# Patient Record
Sex: Female | Born: 1945 | Race: Black or African American | Hispanic: No | Smoking: Current every day smoker
Health system: Southern US, Community
[De-identification: ages and names within clinical notes are randomized; demographics above are authoritative.]

## PROBLEM LIST (undated history)

## (undated) DIAGNOSIS — I10 Essential (primary) hypertension: Secondary | ICD-10-CM

## (undated) DIAGNOSIS — G709 Myoneural disorder, unspecified: Secondary | ICD-10-CM

## (undated) DIAGNOSIS — I340 Nonrheumatic mitral (valve) insufficiency: Secondary | ICD-10-CM

## (undated) DIAGNOSIS — D689 Coagulation defect, unspecified: Secondary | ICD-10-CM

## (undated) DIAGNOSIS — N186 End stage renal disease: Secondary | ICD-10-CM

## (undated) DIAGNOSIS — I639 Cerebral infarction, unspecified: Secondary | ICD-10-CM

## (undated) DIAGNOSIS — F419 Anxiety disorder, unspecified: Secondary | ICD-10-CM

## (undated) DIAGNOSIS — M1990 Unspecified osteoarthritis, unspecified site: Secondary | ICD-10-CM

## (undated) DIAGNOSIS — Z8619 Personal history of other infectious and parasitic diseases: Secondary | ICD-10-CM

## (undated) DIAGNOSIS — K2270 Barrett's esophagus without dysplasia: Secondary | ICD-10-CM

## (undated) DIAGNOSIS — N189 Chronic kidney disease, unspecified: Secondary | ICD-10-CM

## (undated) DIAGNOSIS — L309 Dermatitis, unspecified: Secondary | ICD-10-CM

## (undated) DIAGNOSIS — I2510 Atherosclerotic heart disease of native coronary artery without angina pectoris: Secondary | ICD-10-CM

## (undated) DIAGNOSIS — Z8614 Personal history of Methicillin resistant Staphylococcus aureus infection: Secondary | ICD-10-CM

## (undated) DIAGNOSIS — F329 Major depressive disorder, single episode, unspecified: Secondary | ICD-10-CM

## (undated) DIAGNOSIS — Z86010 Personal history of colonic polyps: Secondary | ICD-10-CM

## (undated) DIAGNOSIS — E8770 Fluid overload, unspecified: Secondary | ICD-10-CM

## (undated) DIAGNOSIS — F32A Depression, unspecified: Secondary | ICD-10-CM

## (undated) DIAGNOSIS — D649 Anemia, unspecified: Secondary | ICD-10-CM

## (undated) DIAGNOSIS — E785 Hyperlipidemia, unspecified: Secondary | ICD-10-CM

## (undated) DIAGNOSIS — E440 Moderate protein-calorie malnutrition: Secondary | ICD-10-CM

## (undated) HISTORY — DX: Coagulation defect, unspecified: D68.9

## (undated) HISTORY — PX: UPPER GASTROINTESTINAL ENDOSCOPY: SHX188

## (undated) HISTORY — DX: Depression, unspecified: F32.A

## (undated) HISTORY — PX: OTHER SURGICAL HISTORY: SHX169

## (undated) HISTORY — DX: Major depressive disorder, single episode, unspecified: F32.9

## (undated) HISTORY — DX: Personal history of Methicillin resistant Staphylococcus aureus infection: Z86.14

## (undated) HISTORY — PX: CATARACT EXTRACTION: SUR2

## (undated) HISTORY — DX: Essential (primary) hypertension: I10

## (undated) HISTORY — DX: Atherosclerotic heart disease of native coronary artery without angina pectoris: I25.10

## (undated) HISTORY — PX: ABDOMINAL HYSTERECTOMY: SHX81

## (undated) HISTORY — DX: Hyperlipidemia, unspecified: E78.5

## (undated) HISTORY — DX: Personal history of colonic polyps: Z86.010

## (undated) HISTORY — DX: Unspecified osteoarthritis, unspecified site: M19.90

## (undated) HISTORY — DX: Myoneural disorder, unspecified: G70.9

## (undated) HISTORY — DX: Fluid overload, unspecified: E87.70

## (undated) HISTORY — DX: Hypercalcemia: E83.52

## (undated) HISTORY — DX: Barrett's esophagus without dysplasia: K22.70

## (undated) HISTORY — DX: Personal history of other infectious and parasitic diseases: Z86.19

## (undated) HISTORY — DX: Moderate protein-calorie malnutrition: E44.0

## (undated) HISTORY — DX: Anxiety disorder, unspecified: F41.9

## (undated) HISTORY — DX: Dermatitis, unspecified: L30.9

## (undated) HISTORY — DX: End stage renal disease: N18.6

## (undated) HISTORY — PX: COLONOSCOPY: SHX174

---

## 2000-05-24 ENCOUNTER — Emergency Department (HOSPITAL_COMMUNITY): Admission: EM | Admit: 2000-05-24 | Discharge: 2000-05-24 | Payer: Self-pay | Admitting: *Deleted

## 2006-09-28 ENCOUNTER — Emergency Department (HOSPITAL_COMMUNITY): Admission: EM | Admit: 2006-09-28 | Discharge: 2006-09-28 | Payer: Self-pay | Admitting: Emergency Medicine

## 2006-10-08 ENCOUNTER — Emergency Department (HOSPITAL_COMMUNITY): Admission: EM | Admit: 2006-10-08 | Discharge: 2006-10-08 | Payer: Self-pay | Admitting: Family Medicine

## 2007-04-25 DIAGNOSIS — Z8614 Personal history of Methicillin resistant Staphylococcus aureus infection: Secondary | ICD-10-CM

## 2007-04-25 HISTORY — DX: Personal history of Methicillin resistant Staphylococcus aureus infection: Z86.14

## 2007-08-30 ENCOUNTER — Ambulatory Visit: Payer: Self-pay | Admitting: Internal Medicine

## 2007-08-30 ENCOUNTER — Inpatient Hospital Stay (HOSPITAL_COMMUNITY): Admission: EM | Admit: 2007-08-30 | Discharge: 2007-09-06 | Payer: Self-pay | Admitting: Emergency Medicine

## 2007-09-11 ENCOUNTER — Encounter (INDEPENDENT_AMBULATORY_CARE_PROVIDER_SITE_OTHER): Payer: Self-pay | Admitting: Internal Medicine

## 2007-09-24 ENCOUNTER — Ambulatory Visit: Payer: Self-pay | Admitting: Internal Medicine

## 2007-09-24 DIAGNOSIS — E119 Type 2 diabetes mellitus without complications: Secondary | ICD-10-CM

## 2007-10-01 ENCOUNTER — Ambulatory Visit: Payer: Self-pay | Admitting: *Deleted

## 2007-10-10 ENCOUNTER — Encounter (INDEPENDENT_AMBULATORY_CARE_PROVIDER_SITE_OTHER): Payer: Self-pay | Admitting: Internal Medicine

## 2007-10-22 ENCOUNTER — Ambulatory Visit: Payer: Self-pay | Admitting: Internal Medicine

## 2007-10-22 ENCOUNTER — Encounter (INDEPENDENT_AMBULATORY_CARE_PROVIDER_SITE_OTHER): Payer: Self-pay | Admitting: Internal Medicine

## 2007-10-22 DIAGNOSIS — L0390 Cellulitis, unspecified: Secondary | ICD-10-CM

## 2007-10-22 DIAGNOSIS — L0291 Cutaneous abscess, unspecified: Secondary | ICD-10-CM | POA: Insufficient documentation

## 2007-10-22 LAB — CONVERTED CEMR LAB: Blood Glucose, AC Bkfst: 98 mg/dL

## 2007-10-23 ENCOUNTER — Encounter (INDEPENDENT_AMBULATORY_CARE_PROVIDER_SITE_OTHER): Payer: Self-pay | Admitting: Internal Medicine

## 2007-11-04 ENCOUNTER — Ambulatory Visit: Payer: Self-pay | Admitting: Internal Medicine

## 2007-11-04 DIAGNOSIS — I10 Essential (primary) hypertension: Secondary | ICD-10-CM | POA: Insufficient documentation

## 2007-11-04 LAB — CONVERTED CEMR LAB: Blood Glucose, Fingerstick: 113

## 2007-11-27 ENCOUNTER — Ambulatory Visit: Payer: Self-pay | Admitting: *Deleted

## 2007-11-27 ENCOUNTER — Encounter (INDEPENDENT_AMBULATORY_CARE_PROVIDER_SITE_OTHER): Payer: Self-pay | Admitting: Internal Medicine

## 2007-11-27 DIAGNOSIS — L738 Other specified follicular disorders: Secondary | ICD-10-CM | POA: Insufficient documentation

## 2007-11-27 LAB — CONVERTED CEMR LAB
BUN: 18 mg/dL (ref 6–23)
Basophils Relative: 0 % (ref 0–1)
Chloride: 104 meq/L (ref 96–112)
Lymphs Abs: 4.1 10*3/uL — ABNORMAL HIGH (ref 0.7–4.0)
MCHC: 32.4 g/dL (ref 30.0–36.0)
Monocytes Relative: 6 % (ref 3–12)
Neutro Abs: 3.7 10*3/uL (ref 1.7–7.7)
Neutrophils Relative %: 42 % — ABNORMAL LOW (ref 43–77)
Potassium: 4.3 meq/L (ref 3.5–5.3)
RBC: 4.18 M/uL (ref 3.87–5.11)
WBC: 8.7 10*3/uL (ref 4.0–10.5)

## 2007-12-12 ENCOUNTER — Ambulatory Visit: Payer: Self-pay | Admitting: *Deleted

## 2007-12-12 ENCOUNTER — Telehealth: Payer: Self-pay | Admitting: *Deleted

## 2007-12-13 ENCOUNTER — Encounter (INDEPENDENT_AMBULATORY_CARE_PROVIDER_SITE_OTHER): Payer: Self-pay | Admitting: Internal Medicine

## 2007-12-19 ENCOUNTER — Ambulatory Visit: Payer: Self-pay | Admitting: *Deleted

## 2007-12-19 DIAGNOSIS — G4700 Insomnia, unspecified: Secondary | ICD-10-CM

## 2007-12-19 LAB — CONVERTED CEMR LAB: Blood Glucose, Fingerstick: 106

## 2008-01-13 ENCOUNTER — Telehealth (INDEPENDENT_AMBULATORY_CARE_PROVIDER_SITE_OTHER): Payer: Self-pay | Admitting: Internal Medicine

## 2008-01-28 ENCOUNTER — Telehealth: Payer: Self-pay | Admitting: Internal Medicine

## 2008-01-28 ENCOUNTER — Encounter (INDEPENDENT_AMBULATORY_CARE_PROVIDER_SITE_OTHER): Payer: Self-pay | Admitting: Internal Medicine

## 2008-01-28 ENCOUNTER — Ambulatory Visit: Payer: Self-pay | Admitting: Internal Medicine

## 2008-01-28 LAB — CONVERTED CEMR LAB
ALT: 24 units/L (ref 0–35)
AST: 17 units/L (ref 0–37)
Alkaline Phosphatase: 73 units/L (ref 39–117)
Calcium: 9.3 mg/dL (ref 8.4–10.5)
Chloride: 108 meq/L (ref 96–112)
Creatinine, Ser: 0.81 mg/dL (ref 0.40–1.20)
Creatinine, Urine: 107.6 mg/dL
LDL Cholesterol: 79 mg/dL (ref 0–99)
Microalb Creat Ratio: 14.7 mg/g (ref 0.0–30.0)
Microalb, Ur: 1.58 mg/dL (ref 0.00–1.89)
Total Bilirubin: 0.4 mg/dL (ref 0.3–1.2)
Total CHOL/HDL Ratio: 3.5
VLDL: 49 mg/dL — ABNORMAL HIGH (ref 0–40)

## 2008-03-02 ENCOUNTER — Telehealth (INDEPENDENT_AMBULATORY_CARE_PROVIDER_SITE_OTHER): Payer: Self-pay | Admitting: Internal Medicine

## 2008-03-03 ENCOUNTER — Ambulatory Visit: Payer: Self-pay | Admitting: Internal Medicine

## 2008-03-03 ENCOUNTER — Encounter (INDEPENDENT_AMBULATORY_CARE_PROVIDER_SITE_OTHER): Payer: Self-pay | Admitting: Internal Medicine

## 2008-03-03 LAB — CONVERTED CEMR LAB: Blood Glucose, Fingerstick: 126

## 2008-03-04 ENCOUNTER — Ambulatory Visit: Payer: Self-pay | Admitting: Infectious Diseases

## 2008-03-04 LAB — CONVERTED CEMR LAB
Basophils Absolute: 0 10*3/uL (ref 0.0–0.1)
Blood Glucose, Fingerstick: 129
CO2: 26 meq/L (ref 19–32)
Glucose, Bld: 117 mg/dL — ABNORMAL HIGH (ref 70–99)
Lymphocytes Relative: 28 % (ref 12–46)
Lymphs Abs: 3.1 10*3/uL (ref 0.7–4.0)
Neutro Abs: 6.4 10*3/uL (ref 1.7–7.7)
Neutrophils Relative %: 60 % (ref 43–77)
Platelets: 280 10*3/uL (ref 150–400)
Potassium: 4.2 meq/L (ref 3.5–5.3)
RDW: 13.5 % (ref 11.5–15.5)
Sodium: 143 meq/L (ref 135–145)
WBC: 10.8 10*3/uL — ABNORMAL HIGH (ref 4.0–10.5)

## 2008-03-05 ENCOUNTER — Ambulatory Visit: Payer: Self-pay | Admitting: Infectious Diseases

## 2008-03-06 ENCOUNTER — Ambulatory Visit: Payer: Self-pay | Admitting: *Deleted

## 2008-03-09 ENCOUNTER — Ambulatory Visit: Payer: Self-pay | Admitting: Infectious Diseases

## 2008-03-11 ENCOUNTER — Ambulatory Visit: Payer: Self-pay | Admitting: Infectious Diseases

## 2008-03-16 ENCOUNTER — Encounter: Payer: Self-pay | Admitting: *Deleted

## 2008-03-16 LAB — CONVERTED CEMR LAB: Blood Glucose, Fingerstick: 111

## 2008-03-23 ENCOUNTER — Ambulatory Visit: Payer: Self-pay | Admitting: Infectious Diseases

## 2008-03-23 LAB — CONVERTED CEMR LAB: Blood Glucose, Fingerstick: 106

## 2008-06-09 ENCOUNTER — Encounter (INDEPENDENT_AMBULATORY_CARE_PROVIDER_SITE_OTHER): Payer: Self-pay | Admitting: Internal Medicine

## 2008-06-09 ENCOUNTER — Ambulatory Visit: Payer: Self-pay | Admitting: Internal Medicine

## 2008-06-09 LAB — CONVERTED CEMR LAB: Blood Glucose, AC Bkfst: 79 mg/dL

## 2008-06-25 ENCOUNTER — Encounter (INDEPENDENT_AMBULATORY_CARE_PROVIDER_SITE_OTHER): Payer: Self-pay | Admitting: *Deleted

## 2008-06-25 ENCOUNTER — Ambulatory Visit: Payer: Self-pay | Admitting: Internal Medicine

## 2008-06-25 LAB — CONVERTED CEMR LAB
BUN: 12 mg/dL (ref 6–23)
Calcium: 9.3 mg/dL (ref 8.4–10.5)
Glucose, Bld: 83 mg/dL (ref 70–99)
Potassium: 4.6 meq/L (ref 3.5–5.3)
Sodium: 143 meq/L (ref 135–145)

## 2008-07-15 ENCOUNTER — Encounter (INDEPENDENT_AMBULATORY_CARE_PROVIDER_SITE_OTHER): Payer: Self-pay | Admitting: Internal Medicine

## 2008-07-16 ENCOUNTER — Encounter (INDEPENDENT_AMBULATORY_CARE_PROVIDER_SITE_OTHER): Payer: Self-pay | Admitting: Internal Medicine

## 2008-07-16 ENCOUNTER — Encounter (INDEPENDENT_AMBULATORY_CARE_PROVIDER_SITE_OTHER): Payer: Self-pay | Admitting: *Deleted

## 2008-07-16 ENCOUNTER — Ambulatory Visit: Payer: Self-pay | Admitting: *Deleted

## 2008-07-16 ENCOUNTER — Telehealth: Payer: Self-pay | Admitting: *Deleted

## 2008-07-16 DIAGNOSIS — F32A Depression, unspecified: Secondary | ICD-10-CM | POA: Insufficient documentation

## 2008-07-16 DIAGNOSIS — R51 Headache: Secondary | ICD-10-CM

## 2008-07-16 DIAGNOSIS — R519 Headache, unspecified: Secondary | ICD-10-CM | POA: Insufficient documentation

## 2008-07-16 DIAGNOSIS — F329 Major depressive disorder, single episode, unspecified: Secondary | ICD-10-CM

## 2008-07-16 LAB — CONVERTED CEMR LAB
Blood Glucose, Fingerstick: 79
CO2: 24 meq/L (ref 19–32)
Calcium: 9.7 mg/dL (ref 8.4–10.5)
Creatinine, Ser: 0.79 mg/dL (ref 0.40–1.20)
GFR calc non Af Amer: >60 mL/min (ref >60)
Sodium: 141 meq/L (ref 135–145)

## 2008-07-22 ENCOUNTER — Ambulatory Visit: Payer: Self-pay | Admitting: Internal Medicine

## 2008-07-22 LAB — CONVERTED CEMR LAB
OCCULT 1: NEGATIVE
OCCULT 2: NEGATIVE

## 2008-07-22 LAB — HM DIABETES EYE EXAM

## 2008-08-04 ENCOUNTER — Ambulatory Visit: Payer: Self-pay | Admitting: Internal Medicine

## 2008-08-04 ENCOUNTER — Encounter (INDEPENDENT_AMBULATORY_CARE_PROVIDER_SITE_OTHER): Payer: Self-pay | Admitting: Internal Medicine

## 2008-08-04 LAB — CONVERTED CEMR LAB
BUN: 13 mg/dL (ref 6–23)
CO2: 25 meq/L (ref 19–32)
Calcium: 9.6 mg/dL (ref 8.4–10.5)
Creatinine, Ser: 0.85 mg/dL (ref 0.40–1.20)
GFR calc Af Amer: >60 mL/min (ref >60)
Glucose, Bld: 81 mg/dL (ref 70–99)
Sodium: 142 meq/L (ref 135–145)

## 2008-08-05 ENCOUNTER — Encounter (INDEPENDENT_AMBULATORY_CARE_PROVIDER_SITE_OTHER): Payer: Self-pay | Admitting: Internal Medicine

## 2008-08-13 ENCOUNTER — Encounter (INDEPENDENT_AMBULATORY_CARE_PROVIDER_SITE_OTHER): Payer: Self-pay | Admitting: Internal Medicine

## 2008-08-20 ENCOUNTER — Encounter: Payer: Self-pay | Admitting: Internal Medicine

## 2008-08-20 ENCOUNTER — Ambulatory Visit: Payer: Self-pay | Admitting: Internal Medicine

## 2008-08-20 LAB — CONVERTED CEMR LAB
BUN: 20 mg/dL (ref 6–23)
Calcium: 9.9 mg/dL (ref 8.4–10.5)
Creatinine, Ser: 1.04 mg/dL (ref 0.40–1.20)
GFR calc Af Amer: >60 mL/min (ref >60)
GFR calc non Af Amer: 54 mL/min — ABNORMAL LOW (ref >60)
Glucose, Bld: 87 mg/dL (ref 70–99)

## 2008-09-14 ENCOUNTER — Telehealth (INDEPENDENT_AMBULATORY_CARE_PROVIDER_SITE_OTHER): Payer: Self-pay | Admitting: Internal Medicine

## 2008-09-24 ENCOUNTER — Ambulatory Visit: Payer: Self-pay | Admitting: Internal Medicine

## 2008-09-24 ENCOUNTER — Encounter (INDEPENDENT_AMBULATORY_CARE_PROVIDER_SITE_OTHER): Payer: Self-pay | Admitting: Internal Medicine

## 2008-09-24 DIAGNOSIS — L259 Unspecified contact dermatitis, unspecified cause: Secondary | ICD-10-CM | POA: Insufficient documentation

## 2008-09-24 LAB — CONVERTED CEMR LAB
BUN: 14 mg/dL (ref 6–23)
CO2: 25 meq/L (ref 19–32)
Calcium: 9.8 mg/dL (ref 8.4–10.5)
Creatinine, Ser: 0.84 mg/dL (ref 0.40–1.20)
GFR calc non Af Amer: >60 mL/min (ref >60)
Glucose, Bld: 84 mg/dL (ref 70–99)
Sodium: 142 meq/L (ref 135–145)

## 2008-10-30 ENCOUNTER — Ambulatory Visit: Payer: Self-pay | Admitting: Internal Medicine

## 2008-10-30 DIAGNOSIS — E785 Hyperlipidemia, unspecified: Secondary | ICD-10-CM | POA: Insufficient documentation

## 2008-10-30 LAB — CONVERTED CEMR LAB: Blood Glucose, Fingerstick: 134

## 2008-11-23 ENCOUNTER — Ambulatory Visit: Payer: Self-pay | Admitting: Internal Medicine

## 2008-11-23 ENCOUNTER — Encounter (INDEPENDENT_AMBULATORY_CARE_PROVIDER_SITE_OTHER): Payer: Self-pay | Admitting: Internal Medicine

## 2008-11-23 DIAGNOSIS — B079 Viral wart, unspecified: Secondary | ICD-10-CM | POA: Insufficient documentation

## 2008-11-23 LAB — CONVERTED CEMR LAB
BUN: 12 mg/dL (ref 6–23)
Calcium: 9.6 mg/dL (ref 8.4–10.5)
Potassium: 4.8 meq/L (ref 3.5–5.3)
Sodium: 142 meq/L (ref 135–145)

## 2008-12-08 ENCOUNTER — Ambulatory Visit: Payer: Self-pay | Admitting: Internal Medicine

## 2008-12-22 ENCOUNTER — Ambulatory Visit: Payer: Self-pay | Admitting: Internal Medicine

## 2008-12-22 DIAGNOSIS — M25529 Pain in unspecified elbow: Secondary | ICD-10-CM

## 2009-01-19 ENCOUNTER — Ambulatory Visit: Payer: Self-pay | Admitting: Internal Medicine

## 2009-01-19 LAB — CONVERTED CEMR LAB
Blood Glucose, Fingerstick: 89
Hgb A1c MFr Bld: 6.4 %

## 2009-02-04 ENCOUNTER — Encounter (INDEPENDENT_AMBULATORY_CARE_PROVIDER_SITE_OTHER): Payer: Self-pay | Admitting: Internal Medicine

## 2009-02-04 ENCOUNTER — Ambulatory Visit: Payer: Self-pay | Admitting: Internal Medicine

## 2009-02-05 ENCOUNTER — Telehealth (INDEPENDENT_AMBULATORY_CARE_PROVIDER_SITE_OTHER): Payer: Self-pay | Admitting: Internal Medicine

## 2009-02-05 LAB — CONVERTED CEMR LAB
ALT: 16 units/L (ref 0–35)
AST: 14 units/L (ref 0–37)
Alkaline Phosphatase: 75 units/L (ref 39–117)
BUN: 11 mg/dL (ref 6–23)
Calcium: 9.5 mg/dL (ref 8.4–10.5)
Cholesterol: 169 mg/dL (ref 0–200)
Potassium: 4.9 meq/L (ref 3.5–5.3)
Sodium: 142 meq/L (ref 135–145)
Total Protein: 7.1 g/dL (ref 6.0–8.3)
Triglycerides: 161 mg/dL — ABNORMAL HIGH (ref <150)
VLDL: 32 mg/dL (ref 0–40)

## 2009-02-18 ENCOUNTER — Ambulatory Visit: Payer: Self-pay | Admitting: Internal Medicine

## 2009-03-15 ENCOUNTER — Ambulatory Visit: Payer: Self-pay | Admitting: Infectious Disease

## 2009-03-15 LAB — CONVERTED CEMR LAB
BUN: 14 mg/dL (ref 6–23)
Blood Glucose, Fingerstick: 128
CO2: 23 meq/L (ref 19–32)
Calcium: 9.6 mg/dL (ref 8.4–10.5)
Chloride: 105 meq/L (ref 96–112)
Creatinine, Ser: 1.03 mg/dL (ref 0.40–1.20)
Glucose, Bld: 140 mg/dL — ABNORMAL HIGH (ref 70–99)

## 2009-03-22 ENCOUNTER — Encounter (INDEPENDENT_AMBULATORY_CARE_PROVIDER_SITE_OTHER): Payer: Self-pay | Admitting: Internal Medicine

## 2009-03-31 ENCOUNTER — Telehealth (INDEPENDENT_AMBULATORY_CARE_PROVIDER_SITE_OTHER): Payer: Self-pay | Admitting: Internal Medicine

## 2009-04-05 ENCOUNTER — Encounter (INDEPENDENT_AMBULATORY_CARE_PROVIDER_SITE_OTHER): Payer: Self-pay | Admitting: Internal Medicine

## 2009-05-04 ENCOUNTER — Telehealth (INDEPENDENT_AMBULATORY_CARE_PROVIDER_SITE_OTHER): Payer: Self-pay | Admitting: Internal Medicine

## 2009-05-12 ENCOUNTER — Ambulatory Visit: Payer: Self-pay | Admitting: Internal Medicine

## 2009-05-12 LAB — CONVERTED CEMR LAB
BUN: 17 mg/dL (ref 6–23)
CO2: 28 meq/L (ref 19–32)
Calcium: 9.9 mg/dL (ref 8.4–10.5)
Creatinine, Ser: 1.07 mg/dL (ref 0.40–1.20)
Glucose, Bld: 103 mg/dL — ABNORMAL HIGH (ref 70–99)

## 2009-06-04 ENCOUNTER — Ambulatory Visit: Payer: Self-pay | Admitting: Internal Medicine

## 2009-06-04 LAB — CONVERTED CEMR LAB: Blood Glucose, Fingerstick: 106

## 2009-06-18 ENCOUNTER — Ambulatory Visit: Payer: Self-pay | Admitting: Internal Medicine

## 2009-06-18 DIAGNOSIS — R000 Tachycardia, unspecified: Secondary | ICD-10-CM

## 2009-07-06 ENCOUNTER — Telehealth (INDEPENDENT_AMBULATORY_CARE_PROVIDER_SITE_OTHER): Payer: Self-pay | Admitting: Internal Medicine

## 2009-07-19 ENCOUNTER — Encounter (INDEPENDENT_AMBULATORY_CARE_PROVIDER_SITE_OTHER): Payer: Self-pay | Admitting: Internal Medicine

## 2009-07-19 ENCOUNTER — Ambulatory Visit: Payer: Self-pay | Admitting: Internal Medicine

## 2009-08-02 ENCOUNTER — Ambulatory Visit: Payer: Self-pay | Admitting: Internal Medicine

## 2009-08-03 ENCOUNTER — Encounter (INDEPENDENT_AMBULATORY_CARE_PROVIDER_SITE_OTHER): Payer: Self-pay | Admitting: Internal Medicine

## 2009-08-03 LAB — CONVERTED CEMR LAB
CO2: 27 meq/L (ref 19–32)
Calcium: 9.5 mg/dL (ref 8.4–10.5)
Chloride: 104 meq/L (ref 96–112)
Creatinine, Ser: 1.26 mg/dL — ABNORMAL HIGH (ref 0.40–1.20)
Glucose, Bld: 119 mg/dL — ABNORMAL HIGH (ref 70–99)
Total Bilirubin: 0.4 mg/dL (ref 0.3–1.2)

## 2009-08-04 ENCOUNTER — Encounter (INDEPENDENT_AMBULATORY_CARE_PROVIDER_SITE_OTHER): Payer: Self-pay | Admitting: Internal Medicine

## 2009-08-04 ENCOUNTER — Ambulatory Visit: Payer: Self-pay | Admitting: Internal Medicine

## 2009-08-04 LAB — CONVERTED CEMR LAB
CO2: 29 meq/L (ref 19–32)
Calcium: 9.7 mg/dL (ref 8.4–10.5)
Glucose, Bld: 130 mg/dL — ABNORMAL HIGH (ref 70–99)
Potassium: 4 meq/L (ref 3.5–5.3)
Sodium: 142 meq/L (ref 135–145)

## 2009-09-15 ENCOUNTER — Ambulatory Visit: Payer: Self-pay | Admitting: Internal Medicine

## 2009-09-15 LAB — CONVERTED CEMR LAB: Blood Glucose, Fingerstick: 92

## 2009-10-05 ENCOUNTER — Telehealth (INDEPENDENT_AMBULATORY_CARE_PROVIDER_SITE_OTHER): Payer: Self-pay | Admitting: Internal Medicine

## 2009-11-10 ENCOUNTER — Telehealth: Payer: Self-pay | Admitting: Internal Medicine

## 2009-12-20 ENCOUNTER — Encounter: Payer: Self-pay | Admitting: Internal Medicine

## 2010-01-07 ENCOUNTER — Ambulatory Visit: Payer: Self-pay | Admitting: Internal Medicine

## 2010-01-07 DIAGNOSIS — J309 Allergic rhinitis, unspecified: Secondary | ICD-10-CM | POA: Insufficient documentation

## 2010-01-07 LAB — CONVERTED CEMR LAB
Blood Glucose, Fingerstick: 355
CO2: 28 meq/L (ref 19–32)
Calcium: 9.8 mg/dL (ref 8.4–10.5)
Cholesterol: 244 mg/dL — ABNORMAL HIGH (ref 0–200)
Glucose, Bld: 295 mg/dL — ABNORMAL HIGH (ref 70–99)
HDL: 33 mg/dL — ABNORMAL LOW (ref >39)
Potassium: 4.3 meq/L (ref 3.5–5.3)
Sodium: 141 meq/L (ref 135–145)
Total CHOL/HDL Ratio: 7.4
VLDL: 66 mg/dL — ABNORMAL HIGH (ref 0–40)

## 2010-01-10 ENCOUNTER — Telehealth: Payer: Self-pay | Admitting: Internal Medicine

## 2010-01-18 ENCOUNTER — Ambulatory Visit: Payer: Self-pay | Admitting: Internal Medicine

## 2010-01-18 ENCOUNTER — Encounter: Payer: Self-pay | Admitting: Internal Medicine

## 2010-01-18 LAB — CONVERTED CEMR LAB
BUN: 13 mg/dL (ref 6–23)
Calcium: 9.7 mg/dL (ref 8.4–10.5)
Creatinine, Ser: 1.16 mg/dL (ref 0.40–1.20)
Glucose, Bld: 207 mg/dL — ABNORMAL HIGH (ref 70–99)
Potassium: 4.2 meq/L (ref 3.5–5.3)

## 2010-02-22 ENCOUNTER — Ambulatory Visit: Payer: Self-pay | Admitting: Internal Medicine

## 2010-02-22 DIAGNOSIS — Z8619 Personal history of other infectious and parasitic diseases: Secondary | ICD-10-CM

## 2010-02-22 HISTORY — DX: Personal history of other infectious and parasitic diseases: Z86.19

## 2010-02-22 LAB — CONVERTED CEMR LAB: Blood Glucose, Fingerstick: 310

## 2010-03-07 ENCOUNTER — Telehealth: Payer: Self-pay | Admitting: *Deleted

## 2010-03-07 ENCOUNTER — Ambulatory Visit: Payer: Self-pay | Admitting: Internal Medicine

## 2010-03-07 DIAGNOSIS — R21 Rash and other nonspecific skin eruption: Secondary | ICD-10-CM

## 2010-03-07 LAB — CONVERTED CEMR LAB: Blood Glucose, Fingerstick: 181

## 2010-03-15 ENCOUNTER — Telehealth (INDEPENDENT_AMBULATORY_CARE_PROVIDER_SITE_OTHER): Payer: Self-pay | Admitting: *Deleted

## 2010-03-25 ENCOUNTER — Ambulatory Visit: Payer: Self-pay | Admitting: Internal Medicine

## 2010-03-25 DIAGNOSIS — B029 Zoster without complications: Secondary | ICD-10-CM | POA: Insufficient documentation

## 2010-03-25 LAB — CONVERTED CEMR LAB
Blood Glucose, Fingerstick: 140
Hgb A1c MFr Bld: 9.3 %

## 2010-04-25 ENCOUNTER — Ambulatory Visit: Payer: Self-pay | Admitting: Internal Medicine

## 2010-04-25 LAB — HM DIABETES FOOT EXAM

## 2010-05-05 ENCOUNTER — Ambulatory Visit
Admission: RE | Admit: 2010-05-05 | Discharge: 2010-05-05 | Payer: Self-pay | Source: Home / Self Care | Attending: Internal Medicine | Admitting: Internal Medicine

## 2010-05-05 LAB — CONVERTED CEMR LAB
OCCULT 1: NEGATIVE
OCCULT 2: NEGATIVE
OCCULT 3: NEGATIVE

## 2010-05-05 LAB — FECAL OCCULT BLOOD, GUAIAC: Fecal Occult Blood: NEGATIVE

## 2010-05-15 ENCOUNTER — Encounter: Payer: Self-pay | Admitting: *Deleted

## 2010-05-24 ENCOUNTER — Telehealth: Payer: Self-pay | Admitting: Internal Medicine

## 2010-05-24 NOTE — Progress Notes (Signed)
Summary: med refill/gp  Phone Note Refill Request Message from:  Patient on November 10, 2009 11:41 AM  Refills Requested: Medication #1:  AMITRIPTYLINE HCL 25 MG TABS Take 1 tablet by mouth once a day in the evening time. Last appt. May 25.   Method Requested: Electronic Initial call taken by: Morrison Old RN,  November 10, 2009 11:41 AM  Follow-up for Phone Call        Refill approved-nurse to complete Follow-up by: Rhea Pink  DO,  November 10, 2009 12:33 PM    Prescriptions: AMITRIPTYLINE HCL 25 MG TABS (AMITRIPTYLINE HCL) Take 1 tablet by mouth once a day in the evening time.  #30 x 1   Entered and Authorized by:   Rhea Pink  DO   Signed by:   Rhea Pink  DO on 11/10/2009   Method used:   Electronically to        [facility] [phone]* (retail)       [place], [place]  [address]       Ph: [phone]       Fax: [phone]   RxIDDB:[id number]   Appended Document: med refill/gp Above Rx sent to pharmacy;pt. was called and made awared.

## 2010-05-24 NOTE — Assessment & Plan Note (Signed)
Summary: 2WK F/U/EST/VS   Vital Signs:  Patient profile:   65 year old female Height:      64 inches (162.56 cm) Weight:      187.04 pounds (85.02 kg) BMI:     32.22 Temp:     97.2 degrees F (36.22 degrees C) oral Pulse rate:   102 / minute BP sitting:   144 / 89  (right arm)  Vitals Entered By: Sander Nephew RN (June 04, 2009 2:07 PM) Is Patient Diabetic? Yes Did you bring your meter with you today? No Pain Assessment Patient in pain? no      Nutritional Status BMI of > 30 = obese CBG Result 106  Have you ever been in a relationship where you felt threatened, hurt or afraid?No   Does patient need assistance? Functional Status Self care Ambulation Normal Comments Check up.  Needs something to calm her nerves sometimes.   Primary Care Provider:  Dawna Part MD   History of Present Illness: Ms. Rodebaugh is a 65 yo woman with HtN, HL, DM who comes to the clinic after two weeks. She was seen in the clinic recently for a headache that has been persistent since last christmas (now about one and half mo). She was started on amitryptiline for possible tension headache. Overall she feels better and is sleeping well too with ambien. But has off and on headache. She relates her headaches to stressful situations and feels fine at other times. No NV, focal weakness, visual problem, syncope, sz or any other problems.    Preventive Screening-Counseling & Management  Alcohol-Tobacco     Alcohol type: sometimes - wine     Smoking Status: current     Smoking Cessation Counseling: yes     Packs/Day: 0.25     Year Started: ABOUT AGE 64  Current Medications (verified): 1)  Metformin Hcl 1000 Mg  Tabs (Metformin Hcl) .... One Tab By Mouth Twice Daily 2)  Adult Aspirin Ec Low Strength 81 Mg  Tbec (Aspirin) .... One Tab Daily 3)  Ambien 5 Mg Tabs (Zolpidem Tartrate) .Marland Kitchen.. 1 Tablet At Bedtime As Needed. 4)  Ultram 50 Mg Tabs (Tramadol Hcl) .... One Tab As Required For Pain Up To Four  Times A Day. 5)  Lisinopril 20 Mg Tabs (Lisinopril) .... Take 1 Tablet By Mouth Once A Day 6)  Amitriptyline Hcl 25 Mg Tabs (Amitriptyline Hcl) .... Take 1 Tablet By Mouth Once A Day in The Evening Time. 7)  Lidex 0.05 % Crea (Fluocinonide) .... Apply A Thin Layer 2 - 4 Times A Day. 8)  Pravachol 40 Mg Tabs (Pravastatin Sodium) .... Take 1 Tablet By Mouth Once A Day 9)  Hydroxyzine Hcl 25 Mg Tabs (Hydroxyzine Hcl) .... Take 1 Tablet By Mouth Four Times A Day As Needed For Itching. 10)  Hydrochlorothiazide 25 Mg Tabs (Hydrochlorothiazide) .... Take 1 Tablet By Mouth Once A Day  Allergies: 1)  ! Sulfa 2)  ! 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   Social History: Last updated: 01/28/2008 Pt smokes 1 ppd x 40 years.   Risk Factors: Exercise: yes (05/12/2009)  Risk Factors: Smoking Status: current (06/04/2009) Packs/Day: 0.25 (06/04/2009)  Review of Systems       As per HPI.   Physical Exam  General:  alert and well-developed.   Head:  normocephalic and atraumatic.   Eyes:  vision grossly intact, pupils equal, pupils round, and pupils reactive to light.   Mouth:  pharynx pink and moist.   Neck:  supple.   Lungs:  normal respiratory effort and no intercostal retractions.   Heart:  normal rate and regular rhythm.   Abdomen:  soft and non-tender.   Pulses:  normal peripheral pulses  Extremities:  no cyanosis, clubbing or edema  Neurologic:  alert & oriented X3, cranial nerves II-XII intact, strength normal in all extremities, and gait normal.   Psych:  normally interactive.     Impression & Recommendations:  Problem # 1:  HEADACHE (ICD-784.0) Headache improved with amitryptiline and clearly related to stressors. However, I would  like to review her again in two weeks time to see how she is doing. I will consider CT head if headache worsens. Will aim for better BP control too, see below.   Her updated medication list for this problem includes:    Adult Aspirin Ec Low Strength 81 Mg Tbec (Aspirin) ..... One tab daily    Ultram 50 Mg Tabs (Tramadol hcl) ..... One tab as required for pain up to four times a day.  Problem # 2:  HYPERTENSION (ICD-401.9) High BP noted, which is persistent since last visit. I will add low dose diuretic to her regimen.   Her updated medication list for this problem includes:    Lisinopril 20 Mg Tabs (Lisinopril) .Marland Kitchen... Take 1 tablet by mouth once a day    Hydrochlorothiazide 25 Mg Tabs (Hydrochlorothiazide) .Marland Kitchen... Take 1 tablet by mouth once a day  Problem # 3:  DIABETES MELLITUS (ICD-250.00)  NO changes today.  Data reviewed:  A1c: 6.1 (05/12/2009 2:46:50 PM)  MICROALB/CR: 14.7 (01/28/2008 9:08:00 PM) LDL: 101 (02/04/2009 8:19:00 PM) EYE: No diabetic retinopathy.    (07/22/2008 7:21:28 AM) FOOT: yes (07/16/2008 9:26:19 AM) FOOT RISK:   WEIGHT: 32.22 (06/04/2009 2:05:32 PM)   Her updated medication list for this problem includes:    Metformin Hcl 1000 Mg Tabs (Metformin hcl) ..... One tab by mouth twice daily    Adult Aspirin Ec Low Strength 81 Mg Tbec (Aspirin) ..... One tab daily    Lisinopril 20 Mg Tabs (Lisinopril) .Marland Kitchen... Take 1 tablet by mouth once a day  Orders: Capillary Blood Glucose/CBG [id number])  Problem # 4:  HYPERLIPIDEMIA (ICD-272.4) Continue statin.   Her updated medication list for this problem includes:    Pravachol 40 Mg Tabs (Pravastatin sodium) .Marland Kitchen... Take 1 tablet by mouth once a day  Complete Medication List: 1)  Metformin Hcl 1000 Mg Tabs (Metformin hcl) .... One tab by mouth twice daily 2)  Adult Aspirin Ec Low Strength 81 Mg Tbec (Aspirin) .... One tab daily 3)  Ambien 5 Mg Tabs (Zolpidem tartrate) .Marland Kitchen.. 1 tablet at bedtime as needed. 4)  Ultram 50 Mg Tabs  (Tramadol hcl) .... One tab as required for pain up to four times a day. 5)  Lisinopril 20 Mg Tabs (Lisinopril) .... Take 1 tablet by mouth once a day 6)  Amitriptyline Hcl 25 Mg Tabs (Amitriptyline hcl) .... Take 1 tablet by mouth once a day in the evening time. 7)  Lidex 0.05 % Crea (Fluocinonide) .... Apply a thin layer 2 - 4 times a day. 8)  Pravachol 40 Mg Tabs (Pravastatin sodium) .... Take 1 tablet by mouth once a day 9)  Hydroxyzine Hcl 25 Mg Tabs (Hydroxyzine hcl) .... Take 1 tablet by  mouth four times a day as needed for itching. 10)  Hydrochlorothiazide 25 Mg Tabs (Hydrochlorothiazide) .... Take 1 tablet by mouth once a day  Patient Instructions: 1)  Please schedule a follow-up appointment in 2 weeks. 2)  Do let us know if your problem worsens.   3)  A new medicine has been added for your blood pressure. Please collect that from your pharmacy.  Prescriptions: HYDROCHLOROTHIAZIDE 25 MG TABS (HYDROCHLOROTHIAZIDE) Take 1 tablet by mouth once a day  #31 x 3   Entered and Authorized by:   Dawna Part MD   Signed by:   Dawna Part MD on 06/04/2009   Method used:   Electronically to        [facility] [phone]* (retail)       [address]       [place], [place]  [address]       Ph: [phone]       Fax: [phone]   RxID:   [id number]   Prevention & Chronic Care Immunizations   Influenza vaccine: Not documented   Influenza vaccine deferral: Refused  (12/08/2008)    Tetanus booster: 11/23/2008: Td    Pneumococcal vaccine: Not documented   Pneumococcal vaccine deferral: Refused  (12/08/2008)    H. zoster vaccine: Not documented  Colorectal Screening   Hemoccult: Not documented    Colonoscopy: Not documented   Colonoscopy action/deferral: Refused  (12/08/2008)  Other Screening   Pap smear: Not documented   Pap smear action/deferral: Not indicated S/P hysterectomy  (12/08/2008)    Mammogram: Not documented   Mammogram action/deferral: Refused   (12/08/2008)    DXA bone density scan: Not documented   Smoking status: current  (06/04/2009)   Smoking cessation counseling: yes  (06/04/2009)  Diabetes Mellitus   HgbA1C: 6.1  (05/12/2009)    Eye exam: No diabetic retinopathy.     (07/22/2008)   Eye exam due: 07/2009    Foot exam: yes  (07/16/2008)   High risk foot: No  (12/22/2008)   Foot care education: Not documented   Foot exam due: 12/21/2009    Urine microalbumin/creatinine ratio: 14.7  (01/28/2008)    Diabetes flowsheet reviewed?: Yes   Progress toward A1C goal: At goal  Lipids   Total Cholesterol: 169  (02/04/2009)   LDL: 101  (02/04/2009)   LDL Direct: Not documented   HDL: 36  (02/04/2009)   Triglycerides: 161  (02/04/2009)    SGOT (AST): 14  (02/04/2009)   SGPT (ALT): 16  (02/04/2009)   Alkaline phosphatase: 75  (02/04/2009)   Total bilirubin: 0.3  (02/04/2009)    Lipid flowsheet reviewed?: Yes   Progress toward LDL goal: Unchanged  Hypertension   Last Blood Pressure: 144 / 89  (06/04/2009)   Serum creatinine: 1.07  (05/12/2009)   Serum potassium 4.7  (05/12/2009)    Hypertension flowsheet reviewed?: Yes   Progress toward BP goal: Unchanged  Self-Management Support :   Personal Goals (by the next clinic visit) :     Personal A1C goal: 7  (03/15/2009)     Personal blood pressure goal: 130/80  (03/15/2009)     Personal LDL goal: 100  (03/15/2009)    Patient will work on the following items until the next clinic visit to reach self-care goals:     Medications and monitoring: take my medicines every day, check my blood sugar, bring all of my medications to every visit, examine my feet every day  (06/04/2009)     Eating: drink diet  soda or water instead of juice or soda, use fresh or frozen vegetables, eat foods that are low in salt, eat baked foods instead of fried foods  (06/04/2009)     Activity: take a 30 minute walk every day, take the stairs instead of the elevator, park at the far end of the  parking lot  (06/04/2009)    Diabetes self-management support: Education handout, Written self-care plan  (03/15/2009)   Last diabetes self-management training by diabetes educator: 10/01/2007    Hypertension self-management support: Written self-care plan  (03/15/2009)    Lipid self-management support: Written self-care plan  (03/15/2009)

## 2010-05-24 NOTE — Progress Notes (Signed)
Summary: refill/ hla  Phone Note Refill Request Message from:  Fax from Pharmacy on October 05, 2009 4:12 PM  Refills Requested: Medication #1:  HYDROXYZINE HCL 25 MG TABS Take 1 tablet by mouth four times a day as needed for itching.   Dosage confirmed as above?Dosage Confirmed   Last Refilled: 4/15 Initial call taken by: Freddy Finner RN,  October 05, 2009 4:12 PM    Prescriptions: HYDROXYZINE HCL 25 MG TABS (HYDROXYZINE HCL) Take 1 tablet by mouth four times a day as needed for itching.  #25 x 2   Entered and Authorized by:   Dawna Part MD   Signed by:   Dawna Part MD on 10/05/2009   Method used:   Electronically to        [facility] [phone]* (retail)       [address]       [place], [place]  [address]       Ph: [phone]       Fax: [phone]   RxID:   [id number]

## 2010-05-24 NOTE — Assessment & Plan Note (Signed)
Summary: 2WK F/U/EST/VS   Vital Signs:  Patient profile:   65 year old female Height:      64 inches Weight:      186.04 pounds BMI:     32.05 Temp:     98.3 degrees F oral Pulse rate:   114 / minute BP sitting:   150 / 95  (right arm)  Vitals Entered By: Sander Nephew RN (June 18, 2009 1:47 PM) Is Patient Diabetic? Yes Did you bring your meter with you today? No Pain Assessment Patient in pain? yes     Location: head Intensity: 4 Type: aching Onset of pain  Constant Nutritional Status BMI of > 30 = obese CBG Result 109  Have you ever been in a relationship where you felt threatened, hurt or afraid?No   Does patient need assistance? Functional Status Self care Ambulation Normal Comments Recheck.   Primary Care Provider:  Dawna Part MD   History of Present Illness: OSA SCHRAG is a 65 year old Female with PMH/problems as outlined in the EMR, who presents to the [facility] for routine follow up on her BP mainly. Was started on HCTZ on last visit. Today is a basically a visit for BP check and b-met. She has not been able to do her meds as planned, going through a rough time, one of her friends passed away recently. She feels very stressed, but denies any SI, or any systemic complaints.   Current Medications (verified): 1)  Metformin Hcl 1000 Mg  Tabs (Metformin Hcl) .... One Tab By Mouth Twice Daily 2)  Adult Aspirin Ec Low Strength 81 Mg  Tbec (Aspirin) .... One Tab Daily 3)  Ambien 5 Mg Tabs (Zolpidem Tartrate) .Marland Kitchen.. 1 Tablet At Bedtime As Needed. 4)  Ultram 50 Mg Tabs (Tramadol Hcl) .... One Tab As Required For Pain Up To Four Times A Day. 5)  Lisinopril 20 Mg Tabs (Lisinopril) .... Take 1 Tablet By Mouth Once A Day 6)  Amitriptyline Hcl 25 Mg Tabs (Amitriptyline Hcl) .... Take 1 Tablet By Mouth Once A Day in The Evening Time. 7)  Lidex 0.05 % Crea (Fluocinonide) .... Apply A Thin Layer 2 - 4 Times A Day. 8)  Pravachol 40 Mg Tabs (Pravastatin Sodium) .... Take 1  Tablet By Mouth Once A Day 9)  Hydroxyzine Hcl 25 Mg Tabs (Hydroxyzine Hcl) .... Take 1 Tablet By Mouth Four Times A Day As Needed For Itching. 10)  Hydrochlorothiazide 25 Mg Tabs (Hydrochlorothiazide) .... Take 1 Tablet By Mouth Once A Day  Allergies: 1)  ! Sulfa 2)  ! 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   Social History: Last updated: 01/28/2008 Pt smokes 1 ppd x 40 years.   Risk Factors: Exercise: yes (05/12/2009)  Risk Factors: Smoking Status: current (06/04/2009) Packs/Day: 0.25 (06/04/2009)  Review of Systems       as per HPI  Physical Exam  General:  alert and well-developed, slightly anxious.  Neck:  supple.   Lungs:  normal respiratory effort and normal breath sounds.   Heart:  normal rate and regular rhythm.   Abdomen:  soft and non-tender.   Pulses:  normal peripheral pulses  Extremities:  no cyanosis, clubbing or edema  Neurologic:  grossly non focal.  Psych:  normally interactive, good eye contact,  and slightly anxious.     Impression & Recommendations:  Problem # 1:  HYPERTENSION (ICD-401.9) HIgh bp noted and confirmed manually. She is not taking her meds as prescribed. Counselled. Will review in 2-4 weeks.   Her updated medication list for this problem includes:    Lisinopril 20 Mg Tabs (Lisinopril) .Marland Kitchen... Take 1 tablet by mouth once a day    Hydrochlorothiazide 25 Mg Tabs (Hydrochlorothiazide) .Marland Kitchen... Take 1 tablet by mouth once a day    Her updated medication list for this problem includes:    Lisinopril 20 Mg Tabs (Lisinopril) .Marland Kitchen... Take 1 tablet by mouth once a day    Hydrochlorothiazide 25 Mg Tabs (Hydrochlorothiazide) .Marland Kitchen... Take 1 tablet by mouth once a day  Problem # 2:  DIABETES MELLITUS  (ICD-250.00)  Data reviewed:  A1c: 6.1 (05/12/2009 2:46:50 PM)  MICROALB/CR: 14.7 (01/28/2008 9:08:00 PM) LDL: 101 (02/04/2009 8:19:00 PM) EYE: No diabetic retinopathy.    (07/22/2008 7:21:28 AM) FOOT: yes (07/16/2008 9:26:19 AM) FOOT RISK:  low  No changes today. Continue the current regimen.   Her updated medication list for this problem includes:    Metformin Hcl 1000 Mg Tabs (Metformin hcl) ..... One tab by mouth twice daily    Adult Aspirin Ec Low Strength 81 Mg Tbec (Aspirin) ..... One tab daily    Lisinopril 20 Mg Tabs (Lisinopril) .Marland Kitchen... Take 1 tablet by mouth once a day  Problem # 3:  HYPERLIPIDEMIA (ICD-272.4) Continue with pravachol 40, LDL is borderline, will emphasize life style modification.   Her updated medication list for this problem includes:    Pravachol 40 Mg Tabs (Pravastatin sodium) .Marland Kitchen... Take 1 tablet by mouth once a day  Problem # 4:  TACHYCARDIA (ICD-785.0) Rate  ~110, borderline, This is likely stress related. I will get a TSH if persistent.   Complete Medication List: 1)  Metformin Hcl 1000 Mg Tabs (Metformin hcl) .... One tab by mouth twice daily 2)  Adult Aspirin Ec Low Strength 81 Mg Tbec (Aspirin) .... One tab daily 3)  Ambien 5 Mg Tabs (Zolpidem tartrate) .Marland Kitchen.. 1 tablet at bedtime as needed. 4)  Ultram 50 Mg Tabs (Tramadol hcl) .... One tab as required for pain up to four times a day. 5)  Lisinopril 20 Mg Tabs (Lisinopril) .... Take 1 tablet by mouth once a day 6)  Amitriptyline Hcl 25 Mg Tabs (Amitriptyline hcl) .... Take 1 tablet by mouth once a day in the evening time. 7)  Lidex 0.05 % Crea (Fluocinonide) .... Apply a thin layer 2 - 4 times a day. 8)  Pravachol 40 Mg Tabs (Pravastatin sodium) .... Take 1 tablet by mouth once a day 9)  Hydroxyzine Hcl 25 Mg Tabs (Hydroxyzine hcl) .... Take 1 tablet by mouth four times a day as needed for itching. 10)  Hydrochlorothiazide 25 Mg Tabs (Hydrochlorothiazide) .... Take 1 tablet by mouth once a  day  Patient Instructions: 1)  Please schedule a follow-up appointment in 1 month.  Prevention & Chronic Care Immunizations   Influenza vaccine: Not documented   Influenza vaccine deferral: Refused  (12/08/2008)    Tetanus booster: 11/23/2008: Td    Pneumococcal vaccine: Not documented   Pneumococcal vaccine deferral: Refused  (12/08/2008)    H. zoster vaccine: Not documented  Colorectal Screening   Hemoccult: Not documented    Colonoscopy: Not documented   Colonoscopy action/deferral: Refused  (12/08/2008)  Other Screening   Pap smear: Not documented   Pap smear action/deferral: Not indicated S/P hysterectomy  (  12/08/2008)    Mammogram: Not documented   Mammogram action/deferral: Refused  (12/08/2008)    DXA bone density scan: Not documented   Smoking status: current  (06/04/2009)   Smoking cessation counseling: yes  (06/04/2009)  Diabetes Mellitus   HgbA1C: 6.1  (05/12/2009)    Eye exam: No diabetic retinopathy.     (07/22/2008)   Eye exam due: 07/2009    Foot exam: yes  (07/16/2008)   High risk foot: No  (12/22/2008)   Foot care education: Not documented   Foot exam due: 12/21/2009    Urine microalbumin/creatinine ratio: 14.7  (01/28/2008)    Diabetes flowsheet reviewed?: Yes   Progress toward A1C goal: At goal  Lipids   Total Cholesterol: 169  (02/04/2009)   LDL: 101  (02/04/2009)   LDL Direct: Not documented   HDL: 36  (02/04/2009)   Triglycerides: 161  (02/04/2009)    SGOT (AST): 14  (02/04/2009)   SGPT (ALT): 16  (02/04/2009)   Alkaline phosphatase: 75  (02/04/2009)   Total bilirubin: 0.3  (02/04/2009)    Lipid flowsheet reviewed?: Yes   Progress toward LDL goal: Unchanged  Hypertension   Last Blood Pressure: 150 / 95  (06/18/2009)   Serum creatinine: 1.07  (05/12/2009)   Serum potassium 4.7  (05/12/2009)    Hypertension flowsheet reviewed?: Yes   Progress toward BP goal: Deteriorated  Self-Management Support :   Personal Goals (by  the next clinic visit) :     Personal A1C goal: 7  (03/15/2009)     Personal blood pressure goal: 130/80  (03/15/2009)     Personal LDL goal: 100  (03/15/2009)    Patient will work on the following items until the next clinic visit to reach self-care goals:     Medications and monitoring: take my medicines every day, check my blood sugar, bring all of my medications to every visit, examine my feet every day  (06/18/2009)     Eating: drink diet soda or water instead of juice or soda, eat more vegetables, eat foods that are low in salt, eat baked foods instead of fried foods, eat fruit for snacks and desserts  (06/18/2009)     Activity: take a 30 minute walk every day, take the stairs instead of the elevator  (06/18/2009)    Diabetes self-management support: Written self-care plan, Education handout, Referred for medical nutrition therapy  (06/18/2009)   Diabetes care plan printed   Diabetes education handout printed   Last diabetes self-management training by diabetes educator: 10/01/2007    Hypertension self-management support: Education handout, Referred for medical nutrition therapy  (06/18/2009)   Hypertension education handout printed    Lipid self-management support: Written self-care plan, Education handout, Referred for medical nutrition therapy  (06/18/2009)   Lipid self-care plan printed.   Lipid education handout printed   Nursing Instructions: Refer for medical nutrition therapy (see order)   Process Orders Tests Sent for requisitioning (June 18, 2009 2:06 PM):     06/18/2009: [facility] -- T-Basic Metabolic Panel 0000000 (unsigned)     06/18/2009: [facility] -- T-TSH [id number] (unsigned)     Vital Signs:  Patient profile:   65 year old female Height:      64 inches Weight:      186.04 pounds BMI:     32.05 Temp:     98.3 degrees F oral Pulse rate:   114 / minute BP sitting:   150 / 95  (right arm)  Vitals Entered  By: Sander Nephew RN (June 18, 2009 1:47 PM)   Diabetes Self Management Training Referral Patient Name: JENNAVIE SALYARDS Date Of Birth: [birth year] MRN: [id number] Current Diagnosis:  TACHYCARDIA (ICD-785.0) ELBOW PAIN, RIGHT (ICD-719.42) SKIN RASH, ALLERGIC (ICD-692.9) HYPERLIPIDEMIA (ICD-272.4) HYPERTENSION (ICD-401.9) DIABETES MELLITUS (ICD-250.00) DEPRESSION (ICD-311)     INSOMNIA UNSPECIFIED (123XX123) FOLLICULITIS (0000000) ABSCESS (ICD-682.9) OTHER SCREENING BREAST EXAMINATION (ICD-V76.19) CIGARETTE SMOKER (ICD-305.1) UNSPECIFIED VIRAL WARTS (ICD-078.10) DERMATITIS (ICD-692.9) HEADACHE (ICD-784.0)   Appended Document: 2WK F/U/EST/VS    Prescriptions: HYDROCHLOROTHIAZIDE 25 MG TABS (HYDROCHLOROTHIAZIDE) Take 1 tablet by mouth once a day  #31 x 3   Entered and Authorized by:   Dawna Part MD   Signed by:   Dawna Part MD on 06/18/2009   Method used:   Electronically to        [facility] [phone]* (retail)       [place], [place]  [address]       Ph: [phone]       Fax: [phone]   RxID:   [id number] LISINOPRIL 20 MG TABS (LISINOPRIL) Take 1 tablet by mouth once a day  #31 x 3   Entered and Authorized by:   Dawna Part MD   Signed by:   Dawna Part MD on 06/18/2009   Method used:   Electronically to        [facility] [phone]* (retail)       [address]       [place],   [address]       Ph: [phone]       Fax: [phone]   RxID:   [id number]

## 2010-05-24 NOTE — Progress Notes (Signed)
Summary: refill/ hla  Phone Note Refill Request Message from:  Fax from Pharmacy on July 06, 2009 3:17 PM  Refills Requested: Medication #1:  ULTRAM 50 MG TABS One tab as required for pain up to four times a day.   Last Refilled: 2/8 last visit 2/25  Initial call taken by: Freddy Finner RN,  July 06, 2009 3:18 PM    Prescriptions: ULTRAM 50 MG TABS (TRAMADOL HCL) One tab as required for pain up to four times a day.  #31 x 0   Entered and Authorized by:   Dawna Part MD   Signed by:   Dawna Part MD on 07/07/2009   Method used:   Electronically to        [facility] [phone]* (retail)       [address]       [place], [place]  [address]       Ph: [phone]       Fax: [phone]   RxID:   [id number]

## 2010-05-24 NOTE — Progress Notes (Signed)
Summary: phone/gg  Phone Note Call from Patient   Caller: Patient Summary of Call: Pt called with c/o  rash from back a under rt breast since thursday.  It is causing some pain rates pain 4/10.   She treated with goldbond without relief.  Will see today. Initial call taken by: Gevena Cotton RN,  March 07, 2010 3:15 PM

## 2010-05-24 NOTE — Assessment & Plan Note (Signed)
Summary: est-regular check/headaches/cfb   Vital Signs:  Patient profile:   65 year old female Height:      64 inches Weight:      187.4 pounds BMI:     32.28 Temp:     97.4 degrees F oral Pulse rate:   75 / minute BP sitting:   137 / 88  Vitals Entered BySilverio Decamp NT II (May 12, 2009 2:48 PM) CC: HEADACHES X 1 MONTH Is Patient Diabetic? Yes Did you bring your meter with you today? Yes Pain Assessment Patient in pain? yes     Location: HEADACHES Intensity: 8 Type: aching Onset of pain  Gradual Nutritional Status BMI of > 30 = obese  Does patient need assistance? Functional Status Self care Ambulation Normal   Primary Care Provider:  Dawna Part MD  CC:  HEADACHES X 1 MONTH.  History of Present Illness: [name] is a 65 yo woman with DM, HTN, HL. Her CC today:  Headache: since before christmas, it's been about a month now. Had constant headache for two weeks and then eased off. Starts from right temporal region to left and goes to the back. Does feel like the tension headaches that she has had in the past. Worse in the afternoon. Feels okay in the morning and then starts hurting. Denies fever, chills, sinus pain, ear or nose discharge, sore throat, problem with eyesight, weakness, numbness, chest pain, SOB, dysuria, bowel trouble or any systemic complaints.     Preventive Screening-Counseling & Management  Alcohol-Tobacco     Alcohol type: sometimes - wine     Smoking Status: current     Smoking Cessation Counseling: yes     Packs/Day: 0.25     Year Started: ABOUT AGE 22  Caffeine-Diet-Exercise     Does Patient Exercise: yes     Type of exercise: YARD WORK     Times/week:  4  Allergies: 1)  ! Sulfa 2)  ! Actos (Pioglitazone Hcl)  Review of Systems      See HPI  Physical Exam  General:  alert and well-developed.   Head:  normocephalic and atraumatic.   Eyes:  vision grossly intact, pupils equal, pupils round, and pupils reactive to light.    Ears:  no external deformities.   Nose:  no external deformity.   Mouth:  good dentition, no gingival abnormalities, no dental plaque, and pharynx pink and moist.   Lungs:  normal respiratory effort and normal breath sounds.   Heart:  normal rate and regular rhythm.   Abdomen:  soft and non-tender.   Pulses:  normal peripheral pulses  Extremities:  no cyanosis, clubbing or edema  Neurologic:  alert & oriented X3, cranial nerves II-XII intact, strength normal in all extremities, sensation intact to light touch, gait normal, and finger-to-nose normal.   Psych:  normally interactive.     Impression & Recommendations:  Problem # 1:  HEADACHE (ICD-784.0) Neuro exam non-focal and history consistent with tension headache. I will start her on low dose amitryptiline and review in two week time. I will check a sed rate today. If persistent headache will consider CT head.   Her updated medication list for this problem includes:    Adult Aspirin Ec Low Strength 81 Mg Tbec (Aspirin) ..... One tab daily    Ultram 50 Mg Tabs (Tramadol hcl) ..... One tab as required for pain up to four times a day.  Orders: T-Sed Rate (Automated) [phone])  Problem # 2:  HYPERTENSION (ICD-401.9)  BP highish, will review when headache is controlled. Check b-met.  Her updated medication list for this problem includes:    Lisinopril 20 Mg Tabs (Lisinopril) .Marland Kitchen... Take 1 tablet by mouth once a day  Orders: T-Basic Metabolic Panel ([id number])  Problem # 3:  DIABETES MELLITUS (ICD-250.00) A1c 6.1. Continue current regimen.   Her updated medication list for this problem includes:    Metformin Hcl 1000 Mg Tabs (Metformin hcl) ..... One tab by mouth twice daily    Adult Aspirin Ec Low Strength 81 Mg Tbec (Aspirin) ..... One tab daily    Lisinopril 20 Mg Tabs (Lisinopril) .Marland Kitchen... Take 1 tablet by mouth once a day  Problem # 4:  HYPERLIPIDEMIA (ICD-272.4) Continue statin. Labs reviewed: Chol: 169 (02/04/2009  8:19:00 PM)HDL:  36 (02/04/2009 8:19:00 PM)LDL:  101 (02/04/2009 8:19:00 PM)Tri:   AST:  14 (02/04/2009 8:19:00 PM) ALT:  16 (02/04/2009 8:19:00 PM)T. Bili:  0.3 (02/04/2009 8:19:00 PM) AP:  75 (02/04/2009 8:19:00 PM)   Her updated medication list for this problem includes:    Pravachol 40 Mg Tabs (Pravastatin sodium) .Marland Kitchen... Take 1 tablet by mouth once a day  Complete Medication List: 1)  Metformin Hcl 1000 Mg Tabs (Metformin hcl) .... One tab by mouth twice daily 2)  Adult Aspirin Ec Low Strength 81 Mg Tbec (Aspirin) .... One tab daily 3)  Ambien 5 Mg Tabs (Zolpidem tartrate) .Marland Kitchen.. 1 tablet at bedtime as needed. 4)  Ultram 50 Mg Tabs (Tramadol hcl) .... One tab as required for pain up to four times a day. 5)  Lisinopril 20 Mg Tabs (Lisinopril) .... Take 1 tablet by mouth once a day 6)  Amitriptyline Hcl 25 Mg Tabs (Amitriptyline hcl) .... Take 1 tablet by mouth once a day in the evening time. 7)  Lidex 0.05 % Crea (Fluocinonide) .... Apply a thin layer 2 - 4 times a day. 8)  Pravachol 40 Mg Tabs (Pravastatin sodium) .... Take 1 tablet by mouth once a day 9)  Hydroxyzine Hcl 25 Mg Tabs (Hydroxyzine hcl) .... Take 1 tablet by mouth four times a day as needed for itching.  Other Orders: T-Hgb A1C [id number])  Patient Instructions: 1)  Please schedule a follow-up appointment in 2 weeks. 2)  We will let you know if anything wrong with your lab work.  3)  Please take Amitryptline as prescribed.  Prescriptions: AMITRIPTYLINE HCL 25 MG TABS (AMITRIPTYLINE HCL) Take 1 tablet by mouth once a day in the evening time.  #30 x 1   Entered and Authorized by:   Dawna Part MD   Signed by:   Dawna Part MD on 05/12/2009   Method used:   Electronically to        [facility] [phone]* (retail)       [address]       [place], [place]  [address]       Ph: [phone]       Fax: [phone]   RxID:   [id number]   Prevention & Chronic Care Immunizations   Influenza vaccine: Not  documented   Influenza vaccine deferral: Refused  (12/08/2008)    Tetanus booster: 11/23/2008: Td    Pneumococcal vaccine: Not documented   Pneumococcal vaccine deferral: Refused  (12/08/2008)    H. zoster vaccine: Not documented  Colorectal Screening   Hemoccult: Not documented    Colonoscopy: Not documented   Colonoscopy action/deferral: Refused  (12/08/2008)  Other Screening   Pap smear: Not documented   Pap smear action/deferral:  Not indicated S/P hysterectomy  (12/08/2008)    Mammogram: Not documented   Mammogram action/deferral: Refused  (12/08/2008)    DXA bone density scan: Not documented   Smoking status: current  (05/12/2009)   Smoking cessation counseling: yes  (05/12/2009)  Diabetes Mellitus   HgbA1C: 6.1  (05/12/2009)    Eye exam: No diabetic retinopathy.     (07/22/2008)   Eye exam due: 07/2009    Foot exam: yes  (07/16/2008)   High risk foot: No  (12/22/2008)   Foot care education: Not documented   Foot exam due: 12/21/2009    Urine microalbumin/creatinine ratio: 14.7  (01/28/2008)    Diabetes flowsheet reviewed?: Yes   Progress toward A1C goal: Improved  Lipids   Total Cholesterol: 169  (02/04/2009)   LDL: 101  (02/04/2009)   LDL Direct: Not documented   HDL: 36  (02/04/2009)   Triglycerides: 161  (02/04/2009)    SGOT (AST): 14  (02/04/2009)   SGPT (ALT): 16  (02/04/2009)   Alkaline phosphatase: 75  (02/04/2009)   Total bilirubin: 0.3  (02/04/2009)    Lipid flowsheet reviewed?: Yes   Progress toward LDL goal: Unchanged  Hypertension   Last Blood Pressure: 137 / 88  (05/12/2009)   Serum creatinine: 1.03  (03/15/2009)   Serum potassium 4.5  (03/15/2009)    Hypertension flowsheet reviewed?: Yes   Progress toward BP goal: Unchanged  Self-Management Support :   Personal Goals (by the next clinic visit) :     Personal A1C goal: 7  (03/15/2009)     Personal blood pressure goal: 130/80  (03/15/2009)     Personal LDL goal: 100   (03/15/2009)    Patient will work on the following items until the next clinic visit to reach self-care goals:     Medications and monitoring: take my medicines every day, check my blood sugar  (05/12/2009)     Eating: drink diet soda or water instead of juice or soda, eat more vegetables, use fresh or frozen vegetables, eat foods that are low in salt, eat baked foods instead of fried foods, eat fruit for snacks and desserts  (05/12/2009)     Activity: take a 30 minute walk every day  (05/12/2009)    Diabetes self-management support: Education handout, Written self-care plan  (03/15/2009)   Last diabetes self-management training by diabetes educator: 10/01/2007    Hypertension self-management support: Written self-care plan  (03/15/2009)    Lipid self-management support: Written self-care plan  (03/15/2009)   Process Orders Check Orders Results:     [facility]: ABN not required for this insurance Tests Sent for requisitioning (May 12, 2009 6:46 PM):     05/12/2009: [facility] -- T-Hgb A1C [[id number]] (signed)     05/12/2009: [facility] -- T-Basic Metabolic Panel 0000000 (signed)     05/12/2009: [facility] -- T-Sed Rate (Automated) [id number] (signed)    Laboratory Results   Blood Tests   Date/Time Received: May 12, 2009 3:42 PM  Date/Time Reported: [name]  May 12, 2009 3:42 PM   HGBA1C: 6.1%   (Normal Range: Non-Diabetic - 3-6%   Control Diabetic - 6-8%)

## 2010-05-24 NOTE — Progress Notes (Signed)
Summary: refill/gg  Phone Note Refill Request  on May 04, 2009 12:53 PM  Refills Requested: Medication #1:  METFORMIN HCL 1000 MG  TABS one tab by mouth twice daily   Last Refilled: 03/30/2009  Method Requested: Electronic Initial call taken by: Gevena Cotton RN,  May 04, 2009 12:54 PM    Prescriptions: METFORMIN HCL 1000 MG  TABS (METFORMIN HCL) one tab by mouth twice daily  #63 x 6   Entered and Authorized by:   Dawna Part MD   Signed by:   Dawna Part MD on 05/05/2009   Method used:   Electronically to        [facility] [phone]* (retail)       [address]       [place], [place]  [address]       Ph: [phone]       Fax: [phone]   RxID:   [id number]

## 2010-05-24 NOTE — Assessment & Plan Note (Signed)
Summary: NEED MEDICATION/ (BOWERS)SB.   Vital Signs:  Patient profile:   65 year old female Height:      64 inches Weight:      191.4 pounds BMI:     32.97 Temp:     99.0 degrees F oral Pulse rate:   96 / minute BP sitting:   151 / 95  (right arm)  Vitals Entered By: Silverio Decamp NT II (January 07, 2010 2:00 PM) CC: need refills,hard to talk does not feel well, cough Is Patient Diabetic? Yes Did you bring your meter with you today? No Pain Assessment Patient in pain? no      Nutritional Status BMI of > 30 = obese CBG Result 355  Have you ever been in a relationship where you felt threatened, hurt or afraid?No   Does patient need assistance? Functional Status Self care Ambulation Normal   Diabetic Foot Exam Last Podiatry Exam Date: 12/19/2007 Foot Inspection Is there a history of a foot ulcer?              No Is there a foot ulcer now?              No Can the patient see the bottom of their feet?          Yes Are the shoes appropriate in style and fit?          Yes Is there swelling or an abnormal foot shape?          No Are the toenails long?                No Are the toenails thick?                No Are the toenails ingrown?              No Is there heavy callous build-up?              No Is there pain in the calf muscle (Intermittent claudication) when walking?    NoIs there a claw toe deformity?              No Is there elevated skin temperature?            No Is there limited ankle dorsiflexion?            No Is there foot or ankle muscle weakness?            No  Diabetic Foot Care Education    10-g (5.07) Semmes-Weinstein Monofilament Test Performed by: Silverio Decamp NT II          Right Foot          Left Foot Site 1         normal         normal Site 2         normal         normal Site 3         normal         normal Site 4         normal         normal Site 5         normal         normal Site 6         normal         normal Site 7         normal  normal Site 8         normal         normal Site 9         normal         normal  Impression      normal         normal   Primary Care Provider:  Dawna Part MD  CC:  need refills, hard to talk does not feel well, and cough.  History of Present Illness: [name] is a 65 year old woman with pmh significant for DM, HTN, HLD, Depression who presents to the clinic for the following:  1) DM - CBGs at run between 120-200. No hypoglycemic episodes.   2) Cough  and sore throat - cough and sore throat started last night. Accompanied with runny nose. No fevers or chills. Productive sputum that has been clear. Has difficuly swallowing. No earache. Also has seasonal allergies. Also complains of post-nasal drip. Only takes benadryl for allergies but states she did not take anything last night. Also complains of pain in her ribs from constant coughing.    Preventive Screening-Counseling & Management  Alcohol-Tobacco     Alcohol type: sometimes - wine     Smoking Status: current     Smoking Cessation Counseling: yes     Packs/Day: 0.25     Year Started: ABOUT AGE 13  Caffeine-Diet-Exercise     Does Patient Exercise: yes     Type of exercise: YARD WORK     Times/week:  4  Current Medications (verified): 1)  Metformin Hcl 1000 Mg  Tabs (Metformin Hcl) .... One Tab By Mouth Twice Daily 2)  Adult Aspirin Ec Low Strength 81 Mg  Tbec (Aspirin) .... One Tab Daily 3)  Ambien 5 Mg Tabs (Zolpidem Tartrate) .Marland Kitchen.. 1 Tablet At Bedtime As Needed. 4)  Ultram 50 Mg Tabs (Tramadol Hcl) .... One Tab As Required For Pain Up To Four Times A Day. 5)  Lisinopril 40 Mg Tabs (Lisinopril) .... Take 1 Tablet By Mouth Once A Day 6)  Amitriptyline Hcl 25 Mg Tabs (Amitriptyline Hcl) .... Take 1 Tablet By Mouth Once A Day in The Evening Time. 7)  Lidex 0.05 % Crea (Fluocinonide) .... Apply A Thin Layer 2 - 4 Times A Day. 8)  Pravachol 40 Mg Tabs (Pravastatin Sodium) .... Take 1 Tablet By Mouth Once A Day 9)   Hydroxyzine Hcl 25 Mg Tabs (Hydroxyzine Hcl) .... Take 1 Tablet By Mouth Four Times A Day As Needed For Itching. 10)  Hydrochlorothiazide 25 Mg Tabs (Hydrochlorothiazide) .... Take 1 Tablet By Mouth Once A Day  Allergies (verified): 1)  ! Sulfa 2)  ! 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   Social History: Last updated: 01/07/2010 Pt smokes 1 ppd x 40 years.  Now smokes 1 pack per week  Unemployed, on SSI Divorced No children  Lives alone   Risk Factors: Exercise: yes (01/07/2010)  Risk Factors: Smoking Status: current (01/07/2010) Packs/Day: 0.25 (01/07/2010)  Social History: Pt smokes 1 ppd x 40 years.  Now smokes 1 pack per week  Unemployed, on SSI Divorced No children  Lives alone   Review of Systems      See HPI  Physical Exam  General:  alert and well-developed.  Head:  normocephalic.   Eyes:  no conjunctivitis noted.  Ears:  R ear normal and L ear normal.   Nose:  nasal turbinates inflamed  Mouth:  very mild pharyngeal edema no exudates  Neck:  supple.   Lungs:  normal respiratory effort, no accessory muscle use, normal breath sounds, no dullness, no crackles, and no wheezes.   Heart:  regular rhythm, no murmur, no gallop, no rub, no JVD, and tachycardia.   Abdomen:  soft and non-tender.    Diabetes Management Exam:    Foot Exam (with socks and/or shoes not present):       Sensory-Monofilament:          Left foot: normal          Right foot: normal   Impression & Recommendations:  Problem # 1:  ALLERGIC RHINITIS (ICD-477.9) Symptoms of postnasal drip, cough, runny nose most consistent with seasonal allergies, however URI 2/2 virus also in differential. Pt has history of allergies. Physical exam did not  show pharyngeal erythema or exudates. No fever either. Advised patient to take loratidine for allergy symptom relief, as well as Cherritussin for cough. Advised patient to come back to clinic if symptoms worsen.   Her updated medication list for this problem includes:    Loratadine 10 Mg Tabs (Loratadine) .Marland Kitchen... Take 1 tablet by mouth once a day  Problem # 2:  DIABETES MELLITUS (ICD-250.00) Assessment: Deteriorated A1C has worsened slightly from 6.9 to 7.4. This is likely secondary to poor diet. Advised patient that if DM continues to worsen, patient may been insulin therapy for better control. Pt states she does not want to start insulin and is willing to manage her diabetes better with better diet, and exercise. Discussed appropriate foods for patient and pt voices understanding.   Her updated medication list for this problem includes:    Metformin Hcl 1000 Mg Tabs (Metformin hcl) ..... One tab by mouth twice daily    Adult Aspirin Ec Low Strength 81 Mg Tbec (Aspirin) ..... One tab daily    Lisinopril 40 Mg Tabs (Lisinopril) .Marland Kitchen... Take 1 tablet by mouth once a day  Orders: T- Capillary Blood Glucose [id number]) T-Hgb A1C (in-house) [id number])  Labs Reviewed: Creat: 0.97 (08/04/2009)     Last Eye Exam: No diabetic retinopathy.    (07/22/2008) Reviewed HgBA1c results: 7.4 (01/07/2010)  6.9 (08/02/2009)  Problem # 3:  HYPERTENSION (ICD-401.9) Blood pressure elevated beyond usual, however this may due to acute illness. Will continue current regimen without making any changes, and continue to follow. Will check renal function today.   Her updated medication list for this problem includes:    Lisinopril 40 Mg Tabs (Lisinopril) .Marland Kitchen... Take 1 tablet by mouth once a day    Hydrochlorothiazide 25 Mg Tabs (Hydrochlorothiazide) .Marland Kitchen... Take 1 tablet by mouth once a day  Orders: T-Basic Metabolic Panel ([id number])  BP today: 151/95 Prior BP: 130/86 (09/15/2009)  Labs Reviewed: K+: 4.0  (08/04/2009) Creat: : 0.97 (08/04/2009)   Chol: 169 (02/04/2009)   HDL: 36 (02/04/2009)   LDL: 101 (02/04/2009)   TG: 161 (02/04/2009)  Problem # 4:  HYPERLIPIDEMIA (ICD-272.4) Continue current regimen and check lipid profile today.   Her updated medication list for this problem includes:    Pravachol 40 Mg Tabs (Pravastatin sodium) .Marland Kitchen... Take 1 tablet by mouth once a day  Orders: T-Lipid Profile [id number])  Labs Reviewed: SGOT: 20 (08/03/2009)   SGPT: 28 (08/03/2009)   HDL:36 (02/04/2009), 51 (01/28/2008)  LDL:101 (02/04/2009), 79 (01/28/2008)  Chol:169 (02/04/2009), 179 (01/28/2008)  Trig:161 (02/04/2009), 245 (01/28/2008)  Problem # 5:  Preventive Health Care (ICD-V70.0) Flu shot today Refused mammogram   Complete Medication List: 1)  Metformin Hcl 1000 Mg Tabs (Metformin hcl) .... One tab by mouth twice daily 2)  Adult Aspirin Ec Low Strength 81 Mg Tbec (Aspirin) .... One tab daily 3)  Ambien 5 Mg Tabs (Zolpidem tartrate) .Marland Kitchen.. 1 tablet at bedtime as needed. 4)  Ultram 50 Mg Tabs (Tramadol hcl) .... One tab as required for pain up to four times a day. 5)  Lisinopril 40 Mg Tabs (Lisinopril) .... Take 1 tablet by mouth once a day 6)  Amitriptyline Hcl 25 Mg Tabs (Amitriptyline hcl) .... Take 1 tablet by mouth once a day in the evening time. 7)  Lidex 0.05 % Crea (Fluocinonide) .... Apply a thin layer 2 - 4 times a day. 8)  Pravachol 40 Mg Tabs (Pravastatin sodium) .... Take 1 tablet by mouth once a day 9)  Hydroxyzine Hcl 25 Mg Tabs (Hydroxyzine hcl) .... Take 1 tablet by mouth four times a day as needed for itching. 10)  Hydrochlorothiazide 25 Mg Tabs (Hydrochlorothiazide) .... Take 1 tablet by mouth once a day 11)  Loratadine 10 Mg Tabs (Loratadine) .... Take 1 tablet by mouth once a day 12)  Cheratussin Ac 100-10 Mg/45ml Syrp (Guaifenesin-codeine) .... 2 tsp every 6 hours as needed for cough  Other Orders: Admin 1st Vaccine [id number]) Flu Vaccine 69yrs + [id number])  Patient  Instructions: 1)  Please schedule a follow-up appointment in 3 months. 2)  Check your blood sugars regularly. If your readings are usually above 200 : or below 70 you should contact our office. Prescriptions: CHERATUSSIN AC 100-10 MG/5ML SYRP (GUAIFENESIN-CODEINE) 2 tsp every 6 hours as needed for cough  #1 bottle x 0   Entered and Authorized by:   Jolene Provost MD   Signed by:   Jolene Provost MD on 01/07/2010   Method used:   Print then Give to Patient   RxID:   [id number] ULTRAM 50 MG TABS (TRAMADOL HCL) One tab as required for pain up to four times a day.  #31 x 3   Entered and Authorized by:   Jolene Provost MD   Signed by:   Jolene Provost MD on 01/07/2010   Method used:   Print then Give to Patient   RxID:   [id number] LORATADINE 10 MG TABS (LORATADINE) Take 1 tablet by mouth once a day  #31 x 3   Entered and Authorized by:   Jolene Provost MD   Signed by:   Jolene Provost MD on 01/07/2010   Method used:   Print then Give to Patient   [id number] HYDROCHLOROTHIAZIDE 25 MG TABS (HYDROCHLOROTHIAZIDE) Take 1 tablet by mouth once a day  #31 x 6   Entered and Authorized by:   Jolene Provost MD   Signed by:   Jolene Provost MD on 01/07/2010   Method used:   Print then Give to Patient   RxID:   [id number] HYDROXYZINE HCL 25 MG TABS (HYDROXYZINE HCL) Take 1 tablet by mouth four times a day as needed for itching.  #25 x 2   Entered and Authorized by:   Jolene Provost MD   Signed by:   Jolene Provost MD on 01/07/2010   Method used:   Print then Give to Patient   RxID:   [id number] PRAVACHOL 40 MG TABS (PRAVASTATIN SODIUM) Take 1 tablet by mouth once a day  #  31 x 6   Entered and Authorized by:   Jolene Provost MD   Signed by:   Jolene Provost MD on 01/07/2010   Method used:   Print then Give to Patient   RxID:   [id number] LIDEX 0.05 % CREA (FLUOCINONIDE) Apply a thin layer 2 - 4 times a day.  #1 x 1   Entered and Authorized by:   Jolene Provost MD    Signed by:   Jolene Provost MD on 01/07/2010   Method used:   Print then Give to Patient   [id number] AMITRIPTYLINE HCL 25 MG TABS (AMITRIPTYLINE HCL) Take 1 tablet by mouth once a day in the evening time.  #30 x 1   Entered and Authorized by:   Jolene Provost MD   Signed by:   Jolene Provost MD on 01/07/2010   Method used:   Print then Give to Patient   [id number] LISINOPRIL 40 MG TABS (LISINOPRIL) Take 1 tablet by mouth once a day  #30 x 6   Entered and Authorized by:   Jolene Provost MD   Signed by:   Jolene Provost MD on 01/07/2010   Method used:   Print then Give to Patient   RxID:   [id number] AMBIEN 5 MG TABS (ZOLPIDEM TARTRATE) 1 tablet at bedtime as needed.  #30 x 0   Entered and Authorized by:   Jolene Provost MD   Signed by:   Jolene Provost MD on 01/07/2010   Method used:   Print then Give to Patient   RxID:   [id number] METFORMIN HCL 1000 MG  TABS (METFORMIN HCL) one tab by mouth twice daily  #63 x 11   Entered and Authorized by:   Jolene Provost MD   Signed by:   Jolene Provost MD on 01/07/2010   Method used:   Print then Give to Patient   RxID:   [id number]   Process Orders Check Orders Results:     [facility]: ABN not required for this insurance Tests Sent for requisitioning (January 07, 2010 5:55 PM):     01/07/2010: [facility] -- T-Lipid Profile [phone] (signed)     01/07/2010: [facility] -- T-Basic Metabolic Panel 0000000 (signed)     Prevention & Chronic Care Immunizations   Influenza vaccine: Fluvax 3+  (01/07/2010)   Influenza vaccine deferral: Refused  (01/07/2010)    Tetanus booster: 11/23/2008: Td   Td booster deferral: Not indicated  (07/19/2009)    Pneumococcal vaccine: Not documented   Pneumococcal vaccine deferral: Refused  (12/08/2008)    H. zoster vaccine: Not documented   H. zoster vaccine deferral: Refused  (07/19/2009)  Colorectal  Screening   Hemoccult: Not documented   Hemoccult action/deferral: Refused  (07/19/2009)    Colonoscopy: Not documented   Colonoscopy action/deferral: Refused  (07/19/2009)  Other Screening   Pap smear: Not documented   Pap smear action/deferral: Not indicated S/P hysterectomy  (12/08/2008)    Mammogram: Not documented   Mammogram action/deferral: Refused  (01/07/2010)    DXA bone density scan: Not documented   Smoking status: current  (01/07/2010)   Smoking cessation counseling: yes  (01/07/2010)  Diabetes Mellitus   HgbA1C: 7.4  (01/07/2010)   HgbA1C action/deferral: Ordered  (08/02/2009)    Eye exam: No diabetic retinopathy.     (07/22/2008)   Eye exam due: 07/2009    Foot exam: yes  (01/07/2010)   Foot exam action/deferral: Do today   High risk  foot: No  (09/15/2009)   Foot care education: Not documented   Foot exam due: 08/03/2010    Urine microalbumin/creatinine ratio: 14.7  (01/28/2008)    Diabetes flowsheet reviewed?: Yes  Lipids   Total Cholesterol: 169  (02/04/2009)   Lipid panel action/deferral: Lipid Panel ordered   LDL: 101  (02/04/2009)   LDL Direct: Not documented   HDL: 36  (02/04/2009)   Triglycerides: 161  (02/04/2009)    SGOT (AST): 20  (08/03/2009)   SGPT (ALT): 28  (08/03/2009)   Alkaline phosphatase: 77  (08/03/2009)   Total bilirubin: 0.4  (08/03/2009)    Lipid flowsheet reviewed?: Yes   Progress toward LDL goal: Deteriorated  Hypertension   Last Blood Pressure: 151 / 95  (01/07/2010)   Serum creatinine: 0.97  (08/04/2009)   BMP action: Ordered   Serum potassium 4.0  (08/04/2009)    Hypertension flowsheet reviewed?: Yes   Progress toward BP goal: Deteriorated  Self-Management Support :   Personal Goals (by the next clinic visit) :     Personal A1C goal: 7  (03/15/2009)     Personal blood pressure goal: 130/80  (03/15/2009)     Personal LDL goal: 100  (03/15/2009)    Patient will work on the following items until the next  clinic visit to reach self-care goals:     Medications and monitoring: take my medicines every day, check my blood sugar  (01/07/2010)     Eating: drink diet soda or water instead of juice or soda, eat more vegetables, use fresh or frozen vegetables, eat foods that are low in salt, eat baked foods instead of fried foods, eat fruit for snacks and desserts  (01/07/2010)     Activity: take a 30 minute walk every day, park at the far end of the parking lot  (01/07/2010)     Other: walks up and down street,work in yard,cut grass  (08/02/2009)    Diabetes self-management support: Education handout, Resources for patients handout  (01/07/2010)   Diabetes education handout printed   Last diabetes self-management training by diabetes educator: 10/01/2007    Hypertension self-management support: Education handout, Resources for patients handout  (01/07/2010)   Hypertension education handout printed    Lipid self-management support: Education handout, Resources for patients handout  (01/07/2010)     Lipid education handout printed      Resource handout printed.   Nursing Instructions: Diabetic foot exam today    Laboratory Results   Blood Tests   Date/Time Received: January 07, 2010 2:32 PM Date/Time Reported: Maryan Rued  January 07, 2010 2:32 PM   HGBA1C: 7.4%   (Normal Range: Non-Diabetic - 3-6%   Control Diabetic - 6-8%) CBG Random:: 355mg /dL    Flu Vaccine Consent Questions     Do you have a history of severe allergic reactions to this vaccine? no    Any prior history of allergic reactions to egg and/or gelatin? no    Do you have a sensitivity to the preservative Thimersol? no    Do you have a past history of Guillan-Barre Syndrome? no    Do you currently have an acute febrile illness? no    Have you ever had a severe reaction to latex? no    Vaccine information given and explained to patient? yes    Are you currently pregnant? no    Lot Number:[id number]   Exp  Date:10/22/2010   Manufacturer: [employer]    Site Given  Right Deltoid IM.Mateo Flow [facility])  January 07, 2010 3:00 PM    HGBA1C: 7.4%   (Normal Range: Non-Diabetic - 3-6%   Control Diabetic - 6-8%) CBG Random:: 355mg /dL     .opcflu

## 2010-05-24 NOTE — Letter (Signed)
Summary: Generic Letter  [facility]  [address]   [place], [place] [address]   Phone: [phone]  Fax: [phone]    07/19/2009  Reisha Nasworthy DOB: [birth year]  TO WHOM IT MAY CONCERN  Please be advised that Ms. Fredericks is receiving treatment in this clinic for diabetes, high blood pressure, high cholesterol, depression, and recurrent skin infections. Do not hesitate to contact us if further information is desired.    Sincerely,   Dawna Part MD

## 2010-05-24 NOTE — Assessment & Plan Note (Signed)
Summary: FU/SB.   Vital Signs:  Patient profile:   65 year old female Height:      64 inches (162.56 cm) Weight:      192.1 pounds (87.32 kg) BMI:     33.09 Temp:     97.7 degrees F Pulse rate:   106 / minute BP sitting:   135 / 88  (left arm) Cuff size:   large  Vitals Entered By: Yvonna Alanis RN (August 02, 2009 2:07 PM) CC: check up- follow up since "broke out itchy rash" on forearms, Depression Is Patient Diabetic? Yes Did you bring your meter with you today? No Nutritional Status BMI of > 30 = obese CBG Result 146  Have you ever been in a relationship where you felt threatened, hurt or afraid?No   Does patient need assistance? Functional Status Self care Ambulation Normal Comments "stressed out by lady down the street - makes me mad and causes my b/p to go up - makes me depressed"   Primary Care Provider:  Dawna Part MD  CC:  check up- follow up since "broke out itchy rash" on forearms and Depression.  History of Present Illness: [name] is a 65 year old Female with PMH/problems as outlined in the EMR, who presents to the [facility] for regular follow up. She does say that she still feels stressed out at times because of difficult home situation, but this has not gone any worse since last time and in fact she feels better on the low dose amitryptiline, which seems to help with her sleeping problem. No new issues today.   Depression History:      The patient denies a depressed mood most of the day and a diminished interest in her usual daily activities.        Comments:  neighbor and father cause her to be angry -"some days worse than others".   Preventive Screening-Counseling & Management  Alcohol-Tobacco     Alcohol type: sometimes - wine     Smoking Status: current     Smoking Cessation Counseling: yes     Packs/Day: 0.25     Year Started: ABOUT AGE 23  Caffeine-Diet-Exercise     Does Patient Exercise: yes     Type of exercise: YARD WORK     Times/week:   4  Comments: ave pack a week, sometimes lasts longer  Current Medications (verified): 1)  Metformin Hcl 1000 Mg  Tabs (Metformin Hcl) .... One Tab By Mouth Twice Daily 2)  Adult Aspirin Ec Low Strength 81 Mg  Tbec (Aspirin) .... One Tab Daily 3)  Ambien 5 Mg Tabs (Zolpidem Tartrate) .Marland Kitchen.. 1 Tablet At Bedtime As Needed. 4)  Ultram 50 Mg Tabs (Tramadol Hcl) .... One Tab As Required For Pain Up To Four Times A Day. 5)  Lisinopril 40 Mg Tabs (Lisinopril) .... Take 1 Tablet By Mouth Once A Day 6)  Amitriptyline Hcl 25 Mg Tabs (Amitriptyline Hcl) .... Take 1 Tablet By Mouth Once A Day in The Evening Time. 7)  Lidex 0.05 % Crea (Fluocinonide) .... Apply A Thin Layer 2 - 4 Times A Day. 8)  Pravachol 40 Mg Tabs (Pravastatin Sodium) .... Take 1 Tablet By Mouth Once A Day 9)  Hydroxyzine Hcl 25 Mg Tabs (Hydroxyzine Hcl) .... Take 1 Tablet By Mouth Four Times A Day As Needed For Itching. 10)  Hydrochlorothiazide 25 Mg Tabs (Hydrochlorothiazide) .... Take 1 Tablet By Mouth Once A Day  Allergies (verified): 1)  ! Sulfa 2)  !  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   Social History: Last updated: 01/28/2008 Pt smokes 1 ppd x 40 years.   Risk Factors: Exercise: yes (08/02/2009)  Risk Factors: Smoking Status: current (08/02/2009) Packs/Day: 0.25 (08/02/2009)  Review of Systems      See HPI  Physical Exam  General:  alert and well-developed.   Lungs:  normal respiratory effort and normal breath sounds.   Heart:  normal rate, regular rhythm, no murmur, no gallop, no rub, and no JVD.   Abdomen:  soft and non-tender.   Pulses:  normal peripheral pulses  Extremities:  no cyanosis, clubbing or edema  foot exam done Neurologic:  alert & oriented X3.   non focal.  Psych:  normally interactive.     Impression & Recommendations:  Problem # 1:  DIABETES MELLITUS (ICD-250.00) A1c: 6.9 (08/02/2009 1:56:51 PM)  MICROALB/CR: 14.7 (01/28/2008 9:08:00 PM) LDL: 101 (02/04/2009 8:19:00 PM) EYE: No diabetic retinopathy.    (07/22/2008 7:21:28 AM) FOOT: yes (07/16/2008 9:26:19 AM) WEIGHT: 33.09 (08/02/2009 1:56:51 PM)   NO CHANGES TODAY Her updated medication list for this problem includes:    Metformin Hcl 1000 Mg Tabs (Metformin hcl) ..... One tab by mouth twice daily    Adult Aspirin Ec Low Strength 81 Mg Tbec (Aspirin) ..... One tab daily    Lisinopril 40 Mg Tabs (Lisinopril) .Marland Kitchen... Take 1 tablet by mouth once a day  Orders: T-Hgb A1C (in-house) [id number]) T- Capillary Blood Glucose [id number])  Problem # 2:  HYPERTENSION (ICD-401.9) Higher than ideal but improving, will keep at the current dose, she started to take lisinopril 40 only a few days ago.   Her updated medication list for this problem includes:    Lisinopril 40 Mg Tabs (Lisinopril) .Marland Kitchen... Take 1 tablet by mouth once a day    Hydrochlorothiazide 25 Mg Tabs (Hydrochlorothiazide) .Marland Kitchen... Take 1 tablet by mouth once a day  Orders: T-Comprehensive Metabolic Panel ([id number])  Problem # 3:  HYPERLIPIDEMIA (ICD-272.4) No changes today.  Her updated medication list for this problem includes:    Pravachol 40 Mg Tabs (Pravastatin sodium) .Marland Kitchen... Take 1 tablet by mouth once a day  Orders: T-Comprehensive Metabolic Panel ([id number])  Problem # 4:  DEPRESSION (ICD-311) Continue with current low dose amitryptiline. Will titrate up if needed.   Her updated medication list for this problem includes:    Amitriptyline Hcl 25 Mg Tabs (Amitriptyline hcl) .Marland Kitchen... Take 1 tablet by mouth once a day in the evening time.    Hydroxyzine Hcl 25 Mg Tabs (Hydroxyzine hcl) .Marland Kitchen... Take 1 tablet by mouth four times a day as needed for itching.  Complete Medication List: 1)  Metformin Hcl 1000 Mg Tabs  (Metformin hcl) .... One tab by mouth twice daily 2)  Adult Aspirin Ec Low Strength 81 Mg Tbec (Aspirin) .... One tab daily 3)  Ambien 5 Mg Tabs (Zolpidem tartrate) .Marland Kitchen.. 1 tablet at bedtime as needed. 4)  Ultram 50 Mg Tabs (Tramadol hcl) .... One tab as required for pain up to four times a day. 5)  Lisinopril 40 Mg Tabs (Lisinopril) .... Take 1 tablet by mouth once a day 6)  Amitriptyline Hcl 25 Mg Tabs (Amitriptyline hcl) .... Take 1 tablet by mouth once a day in the evening time. 7)  Lidex 0.05 %  Crea (Fluocinonide) .... Apply a thin layer 2 - 4 times a day. 8)  Pravachol 40 Mg Tabs (Pravastatin sodium) .... Take 1 tablet by mouth once a day 9)  Hydroxyzine Hcl 25 Mg Tabs (Hydroxyzine hcl) .... Take 1 tablet by mouth four times a day as needed for itching. 10)  Hydrochlorothiazide 25 Mg Tabs (Hydrochlorothiazide) .... Take 1 tablet by mouth once a day  Patient Instructions: 1)  Please schedule a follow-up appointment in 6 weeks. 2)  We will let you know if anything wrong with your lab work.    Prevention & Chronic Care Immunizations   Influenza vaccine: Not documented   Influenza vaccine deferral: Refused  (12/08/2008)    Tetanus booster: 11/23/2008: Td   Td booster deferral: Not indicated  (07/19/2009)    Pneumococcal vaccine: Not documented   Pneumococcal vaccine deferral: Refused  (12/08/2008)    H. zoster vaccine: Not documented   H. zoster vaccine deferral: Refused  (07/19/2009)  Colorectal Screening   Hemoccult: Not documented   Hemoccult action/deferral: Refused  (07/19/2009)    Colonoscopy: Not documented   Colonoscopy action/deferral: Refused  (07/19/2009)  Other Screening   Pap smear: Not documented   Pap smear action/deferral: Not indicated S/P hysterectomy  (12/08/2008)    Mammogram: Not documented   Mammogram action/deferral: Refused  (07/19/2009)    DXA bone density scan: Not documented   Smoking status: current  (08/02/2009)   Smoking cessation  counseling: yes  (08/02/2009)  Diabetes Mellitus   HgbA1C: 6.9  (08/02/2009)   HgbA1C action/deferral: Ordered  (08/02/2009)    Eye exam: No diabetic retinopathy.     (07/22/2008)   Eye exam due: 07/2009    Foot exam: yes  (07/16/2008)   High risk foot: No  (12/22/2008)   Foot care education: Not documented   Foot exam due: 12/21/2009    Urine microalbumin/creatinine ratio: 14.7  (01/28/2008)    Diabetes flowsheet reviewed?: Yes   Progress toward A1C goal: At goal  Lipids   Total Cholesterol: 169  (02/04/2009)   LDL: 101  (02/04/2009)   LDL Direct: Not documented   HDL: 36  (02/04/2009)   Triglycerides: 161  (02/04/2009)    SGOT (AST): 14  (02/04/2009)   SGPT (ALT): 16  (02/04/2009) CMP ordered    Alkaline phosphatase: 75  (02/04/2009)   Total bilirubin: 0.3  (02/04/2009)    Lipid flowsheet reviewed?: Yes   Progress toward LDL goal: Unchanged  Hypertension   Last Blood Pressure: 135 / 88  (08/02/2009)   Serum creatinine: 1.07  (05/12/2009)   Serum potassium 4.7  (05/12/2009) CMP ordered     Hypertension flowsheet reviewed?: Yes   Progress toward BP goal: Improved  Self-Management Support :   Personal Goals (by the next clinic visit) :     Personal A1C goal: 7  (03/15/2009)     Personal blood pressure goal: 130/80  (03/15/2009)     Personal LDL goal: 100  (03/15/2009)    Patient will work on the following items until the next clinic visit to reach self-care goals:     Medications and monitoring: take my medicines every day, check my blood sugar, examine my feet every day  (08/02/2009)     Eating: eat more vegetables, eat foods that are low in salt, eat baked foods instead of fried foods, eat fruit for snacks and desserts  (08/02/2009)     Activity: join a walking program  (08/02/2009)     Other: walks up and down street,work  in yard,cut grass  (08/02/2009)    Diabetes self-management support: Pre-printed educational material, Resources for patients handout,  Written self-care plan  (08/02/2009)   Diabetes care plan printed   Last diabetes self-management training by diabetes educator: 10/01/2007    Hypertension self-management support: Pre-printed educational material, Resources for patients handout, Written self-care plan  (08/02/2009)   Hypertension self-care plan printed.    Lipid self-management support: Pre-printed educational material, Resources for patients handout, Written self-care plan  (08/02/2009)   Lipid self-care plan printed.      Resource handout printed.   Nursing Instructions: HgbA1C today (see order) CBG today (see order)   Process Orders Check Orders Results:     [facility]: ABN not required for this insurance Tests Sent for requisitioning (August 02, 2009 3:05 PM):     08/02/2009: [facility] -- T-Comprehensive Metabolic Panel [id number] (signed)    Laboratory Results   Blood Tests   Date/Time Received: August 02, 2009 2:28 PM Date/Time Reported: Maryan Rued  August 02, 2009 2:28 PM   HGBA1C: 6.9%   (Normal Range: Non-Diabetic - 3-6%   Control Diabetic - 6-8%) CBG Random:: 146mg /dL      Appended Document: FU/SB.  Last LDL:                                                 101 (02/04/2009 8:19:00 PM)        Diabetic Foot Exam Last Podiatry Exam Date: 12/19/2007 Foot Inspection Is there a history of a foot ulcer?              No Is there a foot ulcer now?              No Can the patient see the bottom of their feet?          Yes Are the shoes appropriate in style and fit?          Yes Is there swelling or an abnormal foot shape?          No Are the toenails long?                No Are the toenails thick?                No Are the toenails ingrown?              No Is there heavy callous build-up?              No Is there a claw toe deformity?                          No Is there elevated skin temperature?            No Is there limited ankle dorsiflexion?             No Is there foot or ankle muscle weakness?            No Do you have pain in calf while walking?           No      Pulse Check          Right Foot          Left Foot Posterior Tibial:        3+  3+ Dorsalis Pedis:        3+            3+  High Risk Feet? No Set Next Diabetic Foot Exam here: 08/03/2010   10-g (5.07) Semmes-Weinstein Monofilament Test Performed by: Gracey Tolle MD          Right Foot          Left Foot Site 1         normal         normal Site 4         normal         normal Site 5         normal         normal Site 6         normal         normal

## 2010-05-24 NOTE — Assessment & Plan Note (Signed)
Summary: RA/3 MONTH RECHECK/CH   Vital Signs:  Patient profile:   65 year old female Height:      64 inches Weight:      182.4 pounds BMI:     31.42 Temp:     97.1 degrees F oral Pulse rate:   94 / minute BP sitting:   135 / 89  (right arm)  Vitals Entered By: Silverio Decamp NT II (March 25, 2010 1:36 PM) CC: FOLLOW -UP VISIT FOR SHINGLES, Depression Is Patient Diabetic? Yes Did you bring your meter with you today? No Nutritional Status BMI of > 30 = obese CBG Result 140  Have you ever been in a relationship where you felt threatened, hurt or afraid?No   Does patient need assistance? Functional Status Self care Ambulation Normal   Primary Care Provider:  Epimenio Sarin MD  CC:  FOLLOW -UP VISIT FOR SHINGLES and Depression.  History of Present Illness: Ms. Shipley is a 65yo W with DM2, HTN, HL who presents for follow-up of shingles. She was seen on 03/07/2010 with a painful vesicular, dermatomal rash that spanned from under her R breast to the right side of her back. In addition to causing her a burning pain, this rash was also intermittently itchy. She completed a course of valacyclovir and was given Ultram and benadryl for symptoms relief. However, she continued to have pain that was not relieved with the Ultram; thus, a prescription for Vicodin was called in. Although the rash is now resolving, she continues to have significant pain that makes it difficult for her to focus on other things in her life. She describes the pain and burning and constant and says that she "would not wish it on my worst enemy." She has also heard that there is a vaccine for shingles and she wonders if that would help.   Depression History:      The patient denies a depressed mood most of the day and a diminished interest in her usual daily activities.         Preventive Screening-Counseling & Management  Alcohol-Tobacco     Alcohol type: sometimes - wine     Smoking Status: current     Smoking  Cessation Counseling: yes     Packs/Day: 1 pk per week     Year Started: ABOUT AGE 58  Caffeine-Diet-Exercise     Does Patient Exercise: yes     Type of exercise: YARD WORK     Times/week:  4  Current Medications (verified): 1)  Metformin Hcl 1000 Mg  Tabs (Metformin Hcl) .... One Tab By Mouth Twice Daily 2)  Adult Aspirin Ec Low Strength 81 Mg  Tbec (Aspirin) .... One Tab Daily 3)  Ambien 5 Mg Tabs (Zolpidem Tartrate) .Marland Kitchen.. 1 Tablet At Bedtime As Needed. 4)  Ultram 50 Mg Tabs (Tramadol Hcl) .... One Tab As Required For Pain Up To Four Times A Day. 5)  Lisinopril 40 Mg Tabs (Lisinopril) .... Take 1 Tablet By Mouth Once A Day 6)  Amitriptyline Hcl 25 Mg Tabs (Amitriptyline Hcl) .... Take 1 Tablet By Mouth Once A Day in The Evening Time. 7)  Lidex 0.05 % Crea (Fluocinonide) .... Apply A Thin Layer 2 - 4 Times A Day. 8)  Pravachol 40 Mg Tabs (Pravastatin Sodium) .... Take 1 Tablet By Mouth Once A Day 9)  Hydroxyzine Hcl 25 Mg Tabs (Hydroxyzine Hcl) .... Take 1 Tablet By Mouth Four Times A Day As Needed For Itching. 10)  Hydrochlorothiazide 25  Mg Tabs (Hydrochlorothiazide) .... Take 1 Tablet By Mouth Once A Day 11)  Loratadine 10 Mg Tabs (Loratadine) .... Take 1 Tablet By Mouth Once A Day 12)  Cheratussin Ac 100-10 Mg/76ml Syrp (Guaifenesin-Codeine) .... 2 Tsp Every 6 Hours As Needed For Cough 13)  Diphenhydramine Hcl 25 Mg Tabs (Diphenhydramine Hcl) .... Take One Tablet By Mouth Q6hours As Needed For Itching 14)  Vicodin 5-500 Mg Tabs (Hydrocodone-Acetaminophen) .... Take 1 Tablet By Mouth Every 6 Hours As Needed For Pain 15)  Gabapentin 300 Mg Caps (Gabapentin) .... Take One Tablet Once The First Day, Take One Tablet Twice The Second Day, Take One Tablet Three Times Daily Thereafter  Allergies: 1)  ! Sulfa 2)  ! 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   Social History: Last updated: 01/07/2010 Pt smokes 1 ppd x 40 years.  Now smokes 1 pack per week  Unemployed, on SSI Divorced No children  Lives alone   Review of Systems      See HPI General:  Denies chills and fever. CV:  Denies chest pain or discomfort. Resp:  Denies shortness of breath. GI:  Denies constipation. MS:  Denies joint pain. Derm:  Complains of itching and rash. Neuro:  Denies numbness and tingling.  Physical Exam  General:  alert, cooperative to examination, and overweight-appearing. Appears uncomfortable and mildly anxious.  Head:  normocephalic and atraumatic.   Eyes:  vision grossly intact, pupils equal, pupils round, and pupils reactive to light.   Mouth:  pharynx pink and moist.   Lungs:  normal breath sounds, no crackles, and no wheezes.   Heart:  normal rate, regular rhythm, no murmur, no gallop, and no rub.   Abdomen:  soft and non-tender.   Extremities:  No cyanosis, clubbing, or edema. Neurologic:  alert & oriented X3.   Skin:  Healing rash under R breast spanning to R back. No active vesicles.    Impression & Recommendations:  Problem # 1:  HERPES ZOSTER (ICD-053.9) Assessment Improved Her shingles rash is resolving after a course of acyclovir. However, she continues to have significant pain that has been going on for approximately 3 weeks now. At this point, I believe the duration of symptoms suggests acute neuritis rather than post-herpetic neuralgia. Will prescribe gabapentin, which she will titrate up to three times daily over the next few days. She can also continue to take the Vicodin as needed for pain. She will make a follow-up appointment with me in January. However, she is instructed to call if this regimen does not help with her symptoms or if she has trouble tolerating the gabapentin, as the dose can be further  adjusted. I also explained that she will have to wait a minimum of 6 months after this acute episode of shingles before getting the zoster vaccine and that the vaccine would not help her current symptoms, although it would be helpful in preventing future episodes.   Problem # 2:  DIABETES MELLITUS (ICD-250.00) Assessment: Deteriorated HbA1C demonstrates deteriorated control. However, because of the severity of her zoster symptoms, will focus on addressing pain today with plan to work on diabetic control more at her January appointment.   Her updated medication list for this problem includes:    Metformin Hcl 1000 Mg Tabs (Metformin hcl) .Marland KitchenMarland KitchenMarland KitchenMarland Kitchen  One tab by mouth twice daily    Adult Aspirin Ec Low Strength 81 Mg Tbec (Aspirin) ..... One tab daily    Lisinopril 40 Mg Tabs (Lisinopril) .Marland Kitchen... Take 1 tablet by mouth once a day  Orders: T- Capillary Blood Glucose (82948) T-Hgb A1C (in-house) [id number])  Problem # 3:  HYPERTENSION (ICD-401.9) Assessment: Unchanged Continue current management.   Her updated medication list for this problem includes:    Lisinopril 40 Mg Tabs (Lisinopril) .Marland Kitchen... Take 1 tablet by mouth once a day    Hydrochlorothiazide 25 Mg Tabs (Hydrochlorothiazide) .Marland Kitchen... Take 1 tablet by mouth once a day  Complete Medication List: 1)  Metformin Hcl 1000 Mg Tabs (Metformin hcl) .... One tab by mouth twice daily 2)  Adult Aspirin Ec Low Strength 81 Mg Tbec (Aspirin) .... One tab daily 3)  Ambien 5 Mg Tabs (Zolpidem tartrate) .Marland Kitchen.. 1 tablet at bedtime as needed. 4)  Ultram 50 Mg Tabs (Tramadol hcl) .... One tab as required for pain up to four times a day. 5)  Lisinopril 40 Mg Tabs (Lisinopril) .... Take 1 tablet by mouth once a day 6)  Amitriptyline Hcl 25 Mg Tabs (Amitriptyline hcl) .... Take 1 tablet by mouth once a day in the evening time. 7)  Lidex 0.05 % Crea (Fluocinonide) .... Apply a thin layer 2 - 4 times a day. 8)  Pravachol 40 Mg Tabs (Pravastatin sodium) .... Take 1 tablet  by mouth once a day 9)  Hydroxyzine Hcl 25 Mg Tabs (Hydroxyzine hcl) .... Take 1 tablet by mouth four times a day as needed for itching. 10)  Hydrochlorothiazide 25 Mg Tabs (Hydrochlorothiazide) .... Take 1 tablet by mouth once a day 11)  Loratadine 10 Mg Tabs (Loratadine) .... Take 1 tablet by mouth once a day 12)  Cheratussin Ac 100-10 Mg/42ml Syrp (Guaifenesin-codeine) .... 2 tsp every 6 hours as needed for cough 13)  Diphenhydramine Hcl 25 Mg Tabs (Diphenhydramine hcl) .... Take one tablet by mouth q6hours as needed for itching 14)  Vicodin 5-500 Mg Tabs (Hydrocodone-acetaminophen) .... Take 1 tablet by mouth every 6 hours as needed for pain 15)  Gabapentin 300 Mg Caps (Gabapentin) .... Take one tablet once the first day, take one tablet twice the second day, take one tablet three times daily thereafter  Patient Instructions: 1)  Please schedule a follow-up appointment in 1 month. 2)  If new medications do not control your pain, please call so that we can adjust medication or schedule an earlier appointment.  Prescriptions: VICODIN 5-500 MG TABS (HYDROCODONE-ACETAMINOPHEN) Take 1 tablet by mouth every 6 hours as needed for pain  #30 x 0   Entered and Authorized by:   Epimenio Sarin MD   Signed by:   Epimenio Sarin MD on 03/25/2010   Method used:   Print then Give to Patient   RxID:   [id number] GABAPENTIN 300 MG CAPS (GABAPENTIN) Take one tablet once the first day, take one tablet twice the second day, take one tablet three times daily thereafter  #90 x 0   Entered and Authorized by:   Epimenio Sarin MD   Signed by:   Epimenio Sarin MD on 03/25/2010   Method used:   Print then Give to Patient   RxID:   [id number]    Orders Added: 1)  T- Capillary Blood Glucose [82948] 2)  T-Hgb A1C (in-house) [id number] 3)  Est. Patient Level IV [id number]    Prevention & Chronic Care Immunizations   Influenza vaccine: Fluvax 3+  (  01/07/2010)   Influenza vaccine deferral: Refused   (01/07/2010)    Tetanus booster: 11/23/2008: Td   Td booster deferral: Not indicated  (07/19/2009)    Pneumococcal vaccine: Not documented   Pneumococcal vaccine deferral: Refused  (12/08/2008)    H. zoster vaccine: Not documented   H. zoster vaccine deferral: Refused  (07/19/2009)  Colorectal Screening   Hemoccult: Not documented   Hemoccult action/deferral: Refused  (07/19/2009)    Colonoscopy: Not documented   Colonoscopy action/deferral: Refused  (07/19/2009)  Other Screening   Pap smear: Not documented   Pap smear action/deferral: Not indicated S/P hysterectomy  (12/08/2008)    Mammogram: Not documented   Mammogram action/deferral: Refused  (01/07/2010)    DXA bone density scan: Not documented   Smoking status: current  (03/25/2010)   Smoking cessation counseling: yes  (03/25/2010)  Diabetes Mellitus   HgbA1C: 9.3  (03/25/2010)   HgbA1C action/deferral: Ordered  (08/02/2009)    Eye exam: No diabetic retinopathy.     (07/22/2008)   Eye exam due: 07/2009    Foot exam: yes  (02/22/2010)   Foot exam action/deferral: Do today   High risk foot: No  (03/07/2010)   Foot care education: Done  (03/07/2010)   Foot exam due: 08/03/2010    Urine microalbumin/creatinine ratio: 14.7  (01/28/2008)    Diabetes flowsheet reviewed?: Yes   Progress toward A1C goal: Deteriorated  Lipids   Total Cholesterol: 244  (01/07/2010)   Lipid panel action/deferral: Lipid Panel ordered   LDL: 145  (01/07/2010)   LDL Direct: Not documented   HDL: 33  (01/07/2010)   Triglycerides: 332  (01/07/2010)    SGOT (AST): 20  (08/03/2009)   SGPT (ALT): 28  (08/03/2009)   Alkaline phosphatase: 77  (08/03/2009)   Total bilirubin: 0.4  (08/03/2009)    Lipid flowsheet reviewed?: Yes   Progress toward LDL goal: Unchanged  Hypertension   Last Blood Pressure: 135 / 89  (03/25/2010)   Serum creatinine: 1.16  (01/18/2010)   BMP action: Ordered   Serum potassium 4.2  (01/18/2010)     Hypertension flowsheet reviewed?: Yes   Progress toward BP goal: Unchanged  Self-Management Support :   Personal Goals (by the next clinic visit) :     Personal A1C goal: 7  (03/15/2009)     Personal blood pressure goal: 130/80  (03/15/2009)     Personal LDL goal: 100  (03/15/2009)    Patient will work on the following items until the next clinic visit to reach self-care goals:     Medications and monitoring: take my medicines every day, examine my feet every day  (03/25/2010)     Eating: drink diet soda or water instead of juice or soda, eat more vegetables, use fresh or frozen vegetables, eat foods that are low in salt, eat baked foods instead of fried foods, eat fruit for snacks and desserts  (03/25/2010)     Activity: take a 30 minute walk every day  (03/25/2010)     Other: walks up and down street,work in yard,cut grass  (08/02/2009)    Diabetes self-management support: Education handout  (03/25/2010)   Diabetes education handout printed   Last diabetes self-management training by diabetes educator: 10/01/2007    Hypertension self-management support: Education handout  (03/25/2010)   Hypertension education handout printed    Lipid self-management support: Education handout  (03/25/2010)     Lipid education handout printed   Laboratory Results   Blood Tests   Date/Time Received: March 25, 2010 1:54 PM  Date/Time Reported: Lenoria Farrier  March 25, 2010 1:54 PM   HGBA1C: 9.3%   (Normal Range: Non-Diabetic - 3-6%   Control Diabetic - 6-8%) CBG Random:: 140mg /dL     Appended Document: RA/3 MONTH RECHECK/CH Ms. Bera's history and physical examination were reviewed with Dr. Harlow Mares and the assessment and plan were formulated together.  I agree with the above documentation and plan for treatment of acute herpetic neuralgia.  The diabetes will be further addressed at ehr follow-up appointment with her PCP in January.

## 2010-05-24 NOTE — Progress Notes (Signed)
  Phone Note Outgoing Call   Call placed by: Dr. Ina Homes Call placed to: Patient Reason for Call: Discuss lab or test results Summary of Call: Left message on answering machine for patient to call clinic to discuss lab results.   Follow-up for Phone Call        Called patient again and informed her of her elevated renal function as well as cholesterol. Advised patient to stop taking lisinopril for 5 days and come into clinic early next week to have BUN/Cr rechecked. Acute renal insufficiency is likely due to pre-renal in setting of acute illness and dehydration. Patient states she will come in next week to have labs drawn.  Follow-up by: Jolene Provost MD,  January 11, 2010 12:28 PM     Appended Document:  Called patient and left a message for her to call us back and sch an appointment for lab work sometime next week.

## 2010-05-24 NOTE — Miscellaneous (Signed)
Summary: [facility]  [facility]   Imported By: Garlan Fillers 12/23/2009 14:53:56  _____________________________________________________________________  External Attachment:    Type:   Image     Comment:   External Document

## 2010-05-24 NOTE — Assessment & Plan Note (Signed)
Summary: draining lesion on back/pcp-[name]/hla- per dr Hayleigh Bawa&klima   Vital Signs:  Patient profile:   65 year old female Height:      64 inches (162.56 cm) Weight:      192.8 pounds (87.64 kg) BMI:     33.21 Temp:     97.5 degrees F (36.39 degrees C) oral Pulse rate:   87 / minute BP sitting:   136 / 91  (right arm)  Vitals Entered By: Deborah Blades Ditzler RN (February 22, 2010 9:27 AM) Is Patient Diabetic? Yes Did you bring your meter with you today? No Pain Assessment Patient in pain? yes     Location: right rib cage Intensity: 6 Type: throbbing Onset of pain  over 1+ year Nutritional Status BMI of 25 - 29 = overweight Nutritional Status Detail appetite good CBG Result 310  Have you ever been in a relationship where you felt threatened, hurt or afraid?denies   Does patient need assistance? Functional Status Self care Ambulation Normal Comments Ck keloid on back for over 1 year.   Primary Care Provider:  Epimenio Sarin MD   History of Present Illness: [name] is a 65yo W with DM2, HTN, HL, MRSA infection who presented for evaluation of a small draining subcutaneous nodule on her back. She has a skin scar over her upper mid back from a surgery 2-3 years ago. Four days ago, she noticed a throbbing pain in that scar and she felt a fullness, as if it were full of fluid. She squeezed it and expelled a small amount of white, malodorous pus. For the past four days, it has continued to be painful. She has covered it with a bandage and noticed that it continues to drain a small amount of white/yellow fluid.   She has a history of MRSA abscess on her thigh in 2009, at which time she was diagnosed with diabetes. Her DM had been under good control (Hb A1C<7) until a few months ago -- last A1C 7.4. She admits to missing doses of all of her medications and says that she is tired of taking so many pills. She also admits to eating candy and cake in recent days and having more poorly controlled  blood glucose.  Depression History:      The patient denies a depressed mood most of the day and a diminished interest in her usual daily activities.         Preventive Screening-Counseling & Management  Alcohol-Tobacco     Alcohol type: sometimes - wine     Smoking Status: current     Smoking Cessation Counseling: yes     Packs/Day: 1 pk per week     Year Started: ABOUT AGE 65  Caffeine-Diet-Exercise     Does Patient Exercise: yes     Type of exercise: YARD WORK     Times/week:  4  Current Medications (verified): 1)  Metformin Hcl 1000 Mg  Tabs (Metformin Hcl) .... One Tab By Mouth Twice Daily 2)  Adult Aspirin Ec Low Strength 81 Mg  Tbec (Aspirin) .... One Tab Daily 3)  Ambien 5 Mg Tabs (Zolpidem Tartrate) .Marland Kitchen.. 1 Tablet At Bedtime As Needed. 4)  Ultram 50 Mg Tabs (Tramadol Hcl) .... One Tab As Required For Pain Up To Four Times A Day. 5)  Lisinopril 40 Mg Tabs (Lisinopril) .... Take 1 Tablet By Mouth Once A Day 6)  Amitriptyline Hcl 25 Mg Tabs (Amitriptyline Hcl) .... Take 1 Tablet By Mouth Once A Day in The  Evening Time. 7)  Lidex 0.05 % Crea (Fluocinonide) .... Apply A Thin Layer 2 - 4 Times A Day. 8)  Pravachol 40 Mg Tabs (Pravastatin Sodium) .... Take 1 Tablet By Mouth Once A Day 9)  Hydroxyzine Hcl 25 Mg Tabs (Hydroxyzine Hcl) .... Take 1 Tablet By Mouth Four Times A Day As Needed For Itching. 10)  Hydrochlorothiazide 25 Mg Tabs (Hydrochlorothiazide) .... Take 1 Tablet By Mouth Once A Day 11)  Loratadine 10 Mg Tabs (Loratadine) .... Take 1 Tablet By Mouth Once A Day 12)  Cheratussin Ac 100-10 Mg/67ml Syrp (Guaifenesin-Codeine) .... 2 Tsp Every 6 Hours As Needed For Cough 13)  Smz-Tmp Ds 800-160 Mg Tabs (Sulfamethoxazole-Trimethoprim) .... Take 1 Tablet By Mouth Two Times A Day  Allergies: 1)  ! Sulfa 2)  ! 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   Social History: Last updated: 01/07/2010 Pt smokes 1 ppd x 40 years.  Now smokes 1 pack per week  Unemployed, on SSI Divorced No children  Lives alone   Social History: Packs/Day:  1 pk per week  Review of Systems      See HPI General:  Denies chills, fatigue, fever, loss of appetite, malaise, and sweats.  Physical Exam  General:  alert, cooperative to examination, and overweight-appearing.   Head:  normocephalic and atraumatic.   Eyes:  vision grossly intact.   Mouth:  poor dentition and teeth missing.   Neck:  supple and no masses.   Chest Wall:  no deformities, no tenderness, and no mass.   Lungs:  normal respiratory effort, normal breath sounds, no crackles, and no wheezes.   Heart:  normal rate, regular rhythm, no murmur, no gallop, and no rub.   Abdomen:  soft, non-tender, normal bowel sounds, and no distention.   Pulses:  R posterior tibial normal, R dorsalis pedis normal, L posterior tibial normal, and L dorsalis pedis normal.   Extremities:  No clubbing, cyanosis, edema.   Neurologic:  alert & oriented X3.   Skin:  Small 0.5-1cm raised scar on upper midback with surrounding hyperpigmentation, draining purulent fluid. Adjacent 3cm linear scar appears raised but otherwise normal, no active inflammation.   Diabetes Management Exam:    Foot Exam (with socks and/or shoes not present):       Sensory-Monofilament:          Left foot: normal          Right foot: normal   Impression & Recommendations:  Problem # 1:  ABSCESS (ICD-682.9) [name] has a small skin abscess that has developed at the site of an existing scar. Because of the small size of abscess (<1cm), it should resolve without necessitating I&D. Given her history of MRSA infection, we will prescribe a one week course of Bactrim, which should cover community-acquired MRSA. She was also  advised to keep the area clean, apply warm compresses, and to call the clinic if it does not resolve. We also discussed the importance of improved glycemic control to prevent future infections.   Her updated medication list for this problem includes:    Smz-tmp Ds 800-160 Mg Tabs (Sulfamethoxazole-trimethoprim) .Marland Kitchen... Take 1 tablet by mouth two times a day  Problem # 2:  DIABETES MELLITUS (ICD-250.00) Assessment: Deteriorated Her glycemic control seems to have deteriorated  somewhat over the past few months. Her A1C was previously <7 but most recently increased to 7.4. Her blood glucose today was 310. She admits to missing doses of her metformin and eating a high sugar diet including candy and cake. We discussed the need to keep her diabetes under good control to prevent future infections (discussed in #1) and prevent progression of nephropathy. We also discussed the possibility of adding a second agent to control her diabetes but she expressed a strong desire to avoid adding another medication. It may be possible for her to get her A1C below 7 again with improved medication compliance and diet. I will refer her to Barnabas Harries for diabetes education (she says that she has seen her before but I did not see a record of that). When she returns for follow-up next month, we will evaluate what progress she has made with her glycemic control and consider adding a second agent at that time.    Her updated medication list for this problem includes:    Metformin Hcl 1000 Mg Tabs (Metformin hcl) ..... One tab by mouth twice daily    Adult Aspirin Ec Low Strength 81 Mg Tbec (Aspirin) ..... One tab daily    Lisinopril 40 Mg Tabs (Lisinopril) .Marland Kitchen... Take 1 tablet by mouth once a day  Orders: Capillary Blood Glucose/CBG [id number])  Problem # 3:  HYPERLIPIDEMIA (ICD-272.4) Recent FLP demonstrated worsened cholesterol (LDL increased from 101 to 143). We could consider increasing her dose of pravastatin. However, I would  really like her to focus on improving her diabetes control at this time. I also suspect that her lipid control may have deteriorated along with her diabetes control as she has become more frustrated with taking pills. I have encouraged her to take her pravastatin daily and make better diet choices. We may consider increasing pravastatin at a subsequent visit.   Her updated medication list for this problem includes:    Pravachol 40 Mg Tabs (Pravastatin sodium) .Marland Kitchen... Take 1 tablet by mouth once a day  Complete Medication List: 1)  Metformin Hcl 1000 Mg Tabs (Metformin hcl) .... One tab by mouth twice daily 2)  Adult Aspirin Ec Low Strength 81 Mg Tbec (Aspirin) .... One tab daily 3)  Ambien 5 Mg Tabs (Zolpidem tartrate) .Marland Kitchen.. 1 tablet at bedtime as needed. 4)  Ultram 50 Mg Tabs (Tramadol hcl) .... One tab as required for pain up to four times a day. 5)  Lisinopril 40 Mg Tabs (Lisinopril) .... Take 1 tablet by mouth once a day 6)  Amitriptyline Hcl 25 Mg Tabs (Amitriptyline hcl) .... Take 1 tablet by mouth once a day in the evening time. 7)  Lidex 0.05 % Crea (Fluocinonide) .... Apply a thin layer 2 - 4 times a day. 8)  Pravachol 40 Mg Tabs (Pravastatin sodium) .... Take 1 tablet by mouth once a day 9)  Hydroxyzine Hcl 25 Mg Tabs (Hydroxyzine hcl) .... Take 1 tablet by mouth four times a day as needed for itching. 10)  Hydrochlorothiazide 25 Mg Tabs (Hydrochlorothiazide) .... Take 1 tablet by mouth once a day 11)  Loratadine 10 Mg Tabs (Loratadine) .... Take 1 tablet by mouth once a day 12)  Cheratussin Ac 100-10 Mg/105ml Syrp (Guaifenesin-codeine) .... 2 tsp every 6 hours as needed for cough 13)  Smz-tmp Ds 800-160 Mg Tabs (Sulfamethoxazole-trimethoprim) .... Take 1 tablet by mouth two times a day  Patient Instructions: 1)  Please follow-up at your previously schedule apointment on December 2nd at 2pm.  At that visit, we will discuss your diabetes management. 2)  An antibiotic prescription has been  sent to your [place]. Please take this medication twice per day as directed for seven days for treatment of the infection on your back.  3)  It is important that you exercise regularly at least 20 minutes 5 times a week. If you develop chest pain, have severe difficulty breathing, or feel very tired , stop exercising immediately and seek medical attention. 4)  You need to lose weight. Consider a lower calorie diet and regular exercise.  5)  Check your blood sugars regularly. If your readings are usually above : or below 70 you should contact our office. Prescriptions: SMZ-TMP DS 800-160 MG TABS (SULFAMETHOXAZOLE-TRIMETHOPRIM) Take 1 tablet by mouth two times a day  #14 x 0   Entered and Authorized by:   Epimenio Sarin MD   Signed by:   Epimenio Sarin MD on 02/22/2010   Method used:   Electronically to        [facility] [phone]* (retail)       [address]       [place], [place]  [address]       Ph: [phone]       Fax: [phone]   RxID:   [id number]    Orders Added: 1)  Capillary Blood Glucose/CBG [[id number]] 2)  Est. Patient Level IV [id number]      Prevention & Chronic Care Immunizations   Influenza vaccine: Fluvax 3+  (01/07/2010)   Influenza vaccine deferral: Refused  (01/07/2010)    Tetanus booster: 11/23/2008: Td   Td booster deferral: Not indicated  (07/19/2009)    Pneumococcal vaccine: Not documented   Pneumococcal vaccine deferral: Refused  (12/08/2008)    H. zoster vaccine: Not documented   H. zoster vaccine deferral: Refused  (07/19/2009)  Colorectal Screening   Hemoccult: Not documented   Hemoccult action/deferral: Refused  (07/19/2009)    Colonoscopy: Not documented   Colonoscopy action/deferral: Refused  (07/19/2009)  Other Screening   Pap smear: Not documented   Pap smear action/deferral: Not indicated S/P hysterectomy  (12/08/2008)    Mammogram: Not documented   Mammogram action/deferral: Refused  (01/07/2010)    DXA bone density  scan: Not documented   Smoking status: current  (02/22/2010)   Smoking cessation counseling: yes  (02/22/2010)  Diabetes Mellitus   HgbA1C: 7.4  (01/07/2010)   HgbA1C action/deferral: Ordered  (08/02/2009)    Eye exam: No diabetic retinopathy.     (07/22/2008)   Eye exam due: 07/2009    Foot exam: yes  (02/22/2010)   Foot exam action/deferral: Do today   High risk foot: No  (02/22/2010)   Foot care education: Done  (02/22/2010)   Foot exam due: 08/03/2010    Urine microalbumin/creatinine ratio: 14.7  (01/28/2008)  Lipids   Total Cholesterol: 244  (01/07/2010)   Lipid panel action/deferral: Lipid Panel ordered   LDL: 145  (01/07/2010)   LDL Direct: Not documented   HDL: 33  (01/07/2010)   Triglycerides: 332  (01/07/2010)    SGOT (AST): 20  (08/03/2009)   SGPT (ALT): 28  (08/03/2009)   Alkaline phosphatase: 77  (08/03/2009)   Total bilirubin: 0.4  (08/03/2009)  Hypertension   Last Blood Pressure: 136 / 91  (02/22/2010)   Serum creatinine: 1.16  (01/18/2010)   BMP action: Ordered   Serum potassium 4.2  (01/18/2010)  Self-Management Support :   Personal Goals (by the next clinic visit) :  Personal A1C goal: 7  (03/15/2009)     Personal blood pressure goal: 130/80  (03/15/2009)     Personal LDL goal: 100  (03/15/2009)    Patient will work on the following items until the next clinic visit to reach self-care goals:     Medications and monitoring: take my medicines every day, check my blood sugar, check my blood pressure, examine my feet every day  (02/22/2010)     Eating: eat more vegetables, use fresh or frozen vegetables, eat fruit for snacks and desserts, limit or avoid alcohol  (02/22/2010)     Activity: take a 30 minute walk every day  (02/22/2010)     Other: walks up and down street,work in yard,cut grass  (08/02/2009)    Diabetes self-management support: Written self-care plan, Education handout, Resources for patients handout, Referred for DM self-management  training  (02/22/2010)   Diabetes care plan printed   Diabetes education handout printed   Last diabetes self-management training by diabetes educator: 10/01/2007    Hypertension self-management support: Written self-care plan, Education handout, Resources for patients handout  (02/22/2010)   Hypertension self-care plan printed.   Hypertension education handout printed    Lipid self-management support: Written self-care plan, Education handout, Resources for patients handout  (02/22/2010)   Lipid self-care plan printed.   Lipid education handout printed      Resource handout printed.   Nursing Instructions: Refer for diabetes self-management training (see order)    Last LDL:                                                 145 (01/07/2010 10:45:00 PM)        Diabetic Foot Exam Foot Inspection Is there a history of a foot ulcer?              No Is there a foot ulcer now?              No Can the patient see the bottom of their feet?          Yes Are the shoes appropriate in style and fit?          Yes Is there swelling or an abnormal foot shape?          No Are the toenails long?                No Are the toenails thick?                No Are the toenails ingrown?              No Is there heavy callous build-up?              No Is there a claw toe deformity?                          No Is there elevated skin temperature?            No Is there limited ankle dorsiflexion?            No Is there foot or ankle muscle weakness?            No Do you have pain in calf while walking?           No  Diabetic Foot Care Education :Patient educated on appropriate care of diabetic feet.  Pulse Check          Right Foot          Left Foot Posterior Tibial:        2+            2+ Dorsalis Pedis:        2+            2+  High Risk Feet? No   10-g (5.07) Semmes-Weinstein Monofilament Test Performed by: Deborah Blades Ditzler RN          Right Foot          Left Foot Visual Inspection      normal         normal Test Control      normal         normal Site 1         normal         normal Site 2         normal         normal Site 3         normal         normal Site 4         normal         normal Site 5         normal         normal Site 6         normal         normal Site 7         normal         normal Site 8         normal         normal Site 9         normal         normal Site 10         normal         normal  Impression      normal         normal   Diabetes Self Management Training Referral Patient Name: [name] Date Of Birth: [birth year] MRN: [id number] Current Diagnosis:  ALLERGIC RHINITIS (ICD-477.9) DIABETES MELLITUS (ICD-250.00) HYPERTENSION (ICD-401.9) HYPERLIPIDEMIA (ICD-272.4) CIGARETTE SMOKER (ICD-305.1) DEPRESSION (ICD-311)     INSOMNIA UNSPECIFIED (ICD-780.52) HEADACHE (ICD-784.0) TACHYCARDIA (ICD-785.0) ELBOW PAIN, RIGHT (ICD-719.42) SKIN RASH, ALLERGIC (ICD-692.9) FOLLICULITIS (0000000) ABSCESS (ICD-682.9) OTHER SCREENING BREAST EXAMINATION (ICD-V76.19) UNSPECIFIED VIRAL WARTS (ICD-078.10) DERMATITIS (ICD-692.9)     Management Training Needs:   Initial Diabetes Self management Training   Monitoring  Initial Medical Nutrition Therapy([id number] year)  Complicating Conditions:  HTN  Dyslipidemia  Recurrent Hyperglycemia  Nephropathy  Appended Document: draining lesion on back/pcp-Queenie Aufiero/hla- per dr Sharday Michl&klima I discussed the patient with Dr. Harlow Mares and I agree with the assessment and plan as outlined above. She has a spontaneously draining abscess and a course of ABX is warrented. Pt had previously been able to control DM with diet and metformin - agree with Dr Harlow Mares plan.

## 2010-05-24 NOTE — Assessment & Plan Note (Signed)
Summary: 78MONTH F/U/EST/VS   Vital Signs:  Patient profile:   65 year old female Height:      64 inches (162.56 cm) Weight:      190.3 pounds (84.56 kg) BMI:     32.78 Temp:     97.0 degrees F (36.11 degrees C) oral Pulse rate:   79 / minute BP sitting:   153 / 100  (right arm) Cuff size:   regular  Vitals Entered By: Lucky Rathke NT II (July 19, 2009 2:53 PM) CC: FOLLOW UP  BP  /  RASH ON BILATERAL ARM  / NECK  / FACE WITH ITCHING AT [name] Is Patient Diabetic? Yes Did you bring your meter with you today? No Pain Assessment Patient in pain? no      Nutritional Status BMI of > 30 = obese  Have you ever been in a relationship where you felt threatened, hurt or afraid?No   Does patient need assistance? Functional Status Self care Ambulation Normal Comments FOLLOW UP APPT BP  /  RASH ON BILATERAL ARM / NECK / FACE WITH ITCHING AT TIMES  - STARTED LAST WEEK   Primary Care Provider:  Dawna Part MD  CC:  FOLLOW UP  BP  /  RASH ON BILATERAL ARM  / NECK  / [name].  History of Present Illness: [name] is a 65 year old Female with PMH/problems as outlined in the EMR, who presents to the [facility] with chief complaint(s) of:    -- med refill -- rash: started last week, after working in the yard; very itchy; on the face and arms. She doesn't know what triggered them but denies any bug bite or similar event in the past.  -- going for interview for medicaid next week, wants a letter detailing her medical condition.    Depression History:      The patient denies a depressed mood most of the day and a diminished interest in her usual daily activities.         Preventive Screening-Counseling & Management  Alcohol-Tobacco     Alcohol type: sometimes - wine     Smoking Status: current     Smoking Cessation Counseling: yes     Packs/Day: 0.25     Year Started: ABOUT AGE 70  Caffeine-Diet-Exercise     Does Patient  Exercise: yes     Type of exercise: YARD WORK     Times/week:  4  Current Medications (verified): 1)  Metformin Hcl 1000 Mg  Tabs (Metformin Hcl) .... One Tab By Mouth Twice Daily 2)  Adult Aspirin Ec Low Strength 81 Mg  Tbec (Aspirin) .... One Tab Daily 3)  Ambien 5 Mg Tabs (Zolpidem Tartrate) .Marland Kitchen.. 1 Tablet At Bedtime As Needed. 4)  Ultram 50 Mg Tabs (Tramadol Hcl) .... One Tab As Required For Pain Up To Four Times A Day. 5)  Lisinopril 20 Mg Tabs (Lisinopril) .... Take 1 Tablet By Mouth Once A Day 6)  Amitriptyline Hcl 25 Mg Tabs (Amitriptyline Hcl) .... Take 1 Tablet By Mouth Once A Day in The Evening Time. 7)  Lidex 0.05 % Crea (Fluocinonide) .... Apply A Thin Layer 2 - 4 Times A Day. 8)  Pravachol 40 Mg Tabs (Pravastatin Sodium) .... Take 1 Tablet By Mouth Once A Day 9)  Hydroxyzine Hcl 25 Mg Tabs (Hydroxyzine Hcl) .... Take 1 Tablet By Mouth Four Times A Day As Needed For Itching. 10)  Hydrochlorothiazide 25 Mg Tabs (Hydrochlorothiazide) .... Take 1 Tablet By Mouth Once A Day  Allergies (verified): 1)  ! Sulfa 2)  ! 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   Social History: Last updated: 01/28/2008 Pt smokes 1 ppd x 40 years.   Risk Factors: Exercise: yes (07/19/2009)  Risk Factors: Smoking Status: current (07/19/2009) Packs/Day: 0.25 (07/19/2009)  Review of Systems       as per hPI  Physical Exam  General:  alert.  not in distress Head:  normocephalic and atraumatic.   small bumps on left cheek Mouth:  pharynx pink and moist.   Lungs:  normal respiratory effort and normal breath sounds.   Heart:  normal rate and regular rhythm.   Abdomen:  soft and non-tender.   Pulses:  normal peripheral pulses  Extremities:  no  cyanosis, clubbing or edema  Neurologic:  non focal. Skin:  small bumps/ wheals in and around the wrist Psych:  normally interactive.     Impression & Recommendations:  Problem # 1:  SKIN RASH, ALLERGIC (ICD-692.9) Will treat with topical steroid and hydroxyzine. Review if no improvement.   Her updated medication list for this problem includes:    Lidex 0.05 % Crea (Fluocinonide) .Marland Kitchen... Apply a thin layer 2 - 4 times a day.  Problem # 2:  DIABETES MELLITUS (ICD-250.00) A1c: 6.1 (05/12/2009 2:46:50 PM)  MICROALB/CR: 14.7 (01/28/2008 9:08:00 PM) LDL: 101 (02/04/2009 8:19:00 PM) on pravachol EYE: No diabetic retinopathy.  waiting to go see eye md this year.   (07/22/2008 7:21:28 AM)  Her updated medication list for this problem includes:    Metformin Hcl 1000 Mg Tabs (Metformin hcl) ..... One tab by mouth twice daily    Adult Aspirin Ec Low Strength 81 Mg Tbec (Aspirin) ..... One tab daily    Lisinopril 40 Mg Tabs (Lisinopril) .Marland Kitchen... Take 1 tablet by mouth once a day  Problem # 3:  HYPERTENSION (ICD-401.9) 153/100 today. She says her home monitoring device is also showing high readings. I will increase her lisinopril to 40 mg daily.   Her updated medication list for this problem includes:    Lisinopril 40 Mg Tabs (Lisinopril) .Marland Kitchen... Take 1 tablet by mouth once a day    Hydrochlorothiazide 25 Mg Tabs (Hydrochlorothiazide) .Marland Kitchen... Take 1 tablet by mouth once a day  Problem # 4:  HYPERLIPIDEMIA (ICD-272.4) Continue with pravachol.   Her updated medication list for this problem includes:    Pravachol 40 Mg Tabs (Pravastatin sodium) .Marland Kitchen... Take 1 tablet by mouth once a day  Complete Medication List: 1)  Metformin Hcl 1000 Mg Tabs (Metformin hcl) .... One tab by mouth twice daily 2)  Adult Aspirin Ec Low Strength 81 Mg Tbec (Aspirin) .... One tab daily 3)  Ambien 5 Mg Tabs (Zolpidem tartrate) .Marland Kitchen.. 1 tablet at bedtime as needed. 4)  Ultram 50 Mg Tabs (Tramadol hcl) .... One tab as required  for pain up to four times a day. 5)  Lisinopril 40 Mg Tabs (Lisinopril) .... Take 1 tablet by mouth once a day 6)  Amitriptyline Hcl 25 Mg Tabs (Amitriptyline hcl) .... Take 1 tablet by mouth once a day in the evening time. 7)  Lidex 0.05 % Crea (Fluocinonide) .... Apply a thin layer 2 - 4 times a day.  8)  Pravachol 40 Mg Tabs (Pravastatin sodium) .... Take 1 tablet by mouth once a day 9)  Hydroxyzine Hcl 25 Mg Tabs (Hydroxyzine hcl) .... Take 1 tablet by mouth four times a day as needed for itching. 10)  Hydrochlorothiazide 25 Mg Tabs (Hydrochlorothiazide) .... Take 1 tablet by mouth once a day  Patient Instructions: 1)  Please schedule a follow-up appointment in 2 weeks for blood pressure and blood work.  2)  Do let us know if your problem worsens.   Prescriptions: LIDEX 0.05 % CREA (FLUOCINONIDE) Apply a thin layer 2 - 4 times a day.  #1 x 1   Entered and Authorized by:   Dawna Part MD   Signed by:   Dawna Part MD on 07/19/2009   Method used:   Electronically to        [id number]* (retail)       [place], [place]  [address]       Ph: [phone]       Fax: [phone]   RxID:   [id number] HYDROXYZINE HCL 25 MG TABS (HYDROXYZINE HCL) Take 1 tablet by mouth four times a day as needed for itching.  #25 x 1   Entered and Authorized by:   Dawna Part MD   Signed by:   Dawna Part MD on 07/19/2009   Method used:   Electronically to        [facility] [phone]* (retail)       [place], [place]  [address]       Ph: [phone]       Fax: [phone]   RxID:   [id number] LISINOPRIL 40 MG TABS (LISINOPRIL) Take 1 tablet by mouth once a day  #30 x 6   Entered and Authorized by:   Dawna Part MD   Signed by:   Dawna Part MD on 07/19/2009   Method used:   Electronically to        [facility] [phone]* (retail)       [address]       [place], [place]  [address]       Ph: [phone]       Fax:  [phone]   RxID:   [id number]    Prevention & Chronic Care Immunizations   Influenza vaccine: Not documented   Influenza vaccine deferral: Refused  (12/08/2008)    Tetanus booster: 11/23/2008: Td   Td booster deferral: Not indicated  (07/19/2009)    Pneumococcal vaccine: Not documented   Pneumococcal vaccine deferral: Refused  (12/08/2008)    H. zoster vaccine: Not documented   H. zoster vaccine deferral: Refused  (07/19/2009)  Colorectal Screening   Hemoccult: Not documented   Hemoccult action/deferral: Refused  (07/19/2009)    Colonoscopy: Not documented   Colonoscopy action/deferral: Refused  (07/19/2009)  Other Screening   Pap smear: Not documented   Pap smear action/deferral: Not indicated S/P hysterectomy  (12/08/2008)    Mammogram: Not documented   Mammogram action/deferral: Refused  (07/19/2009)    DXA bone density scan: Not documented   Smoking status: current  (07/19/2009)   Smoking cessation counseling: yes  (07/19/2009)  Diabetes Mellitus   HgbA1C: 6.1  (05/12/2009)    Eye exam: No diabetic retinopathy.     (07/22/2008)   Eye exam due: 07/2009    Foot exam: yes  (07/16/2008)   High risk foot: No  (  12/22/2008)   Foot care education: Not documented   Foot exam due: 12/21/2009    Urine microalbumin/creatinine ratio: 14.7  (01/28/2008)  Lipids   Total Cholesterol: 169  (02/04/2009)   LDL: 101  (02/04/2009)   LDL Direct: Not documented   HDL: 36  (02/04/2009)   Triglycerides: 161  (02/04/2009)    SGOT (AST): 14  (02/04/2009)   SGPT (ALT): 16  (02/04/2009)   Alkaline phosphatase: 75  (02/04/2009)   Total bilirubin: 0.3  (02/04/2009)  Hypertension   Last Blood Pressure: 153 / 100  (07/19/2009)   Serum creatinine: 1.07  (05/12/2009)   Serum potassium 4.7  (05/12/2009)  Self-Management Support :   Personal Goals (by the next clinic visit) :     Personal A1C goal: 7  (03/15/2009)     Personal blood pressure goal: 130/80   (03/15/2009)     Personal LDL goal: 100  (03/15/2009)    Patient will work on the following items until the next clinic visit to reach self-care goals:     Medications and monitoring: take my medicines every day, check my blood sugar, bring all of my medications to every visit, examine my feet every day  (07/19/2009)     Eating: drink diet soda or water instead of juice or soda, eat more vegetables, use fresh or frozen vegetables, eat foods that are low in salt, eat baked foods instead of fried foods, eat fruit for snacks and desserts, limit or avoid alcohol  (07/19/2009)     Activity: take a 30 minute walk every day  (07/19/2009)    Diabetes self-management support: Resources for patients handout  (07/19/2009)   Last diabetes self-management training by diabetes educator: 10/01/2007    Hypertension self-management support: Resources for patients handout  (07/19/2009)    Lipid self-management support: Resources for patients handout  (07/19/2009)        Resource handout printed.

## 2010-05-24 NOTE — Progress Notes (Signed)
Summary: phone/gg  Phone Note Call from Patient   Caller: Patient Summary of Call: Pt called and is c/o pain from [place]. She was seen in clinic on  11/14 for outbreak of shingles.  She states the ultram is not helping.  She is taking 2 tabs every 6 hours with NO relief from pain.   What can she do? Pt # [id number] Initial call taken by: Gevena Cotton RN,  March 15, 2010 11:03 AM  Follow-up for Phone Call        her shingles should be getting better- I will give her some (small amoount) of vicodin for a few days, but she will need to be evaluated if the lesions remain painful. Follow-up by: Rhea Pink  DO,  March 15, 2010 12:25 PM  Additional Follow-up for Phone Call Additional follow up Details #1::        Pt informed meds called in. Additional Follow-up by: Gevena Cotton RN,  March 15, 2010 2:21 PM    New/Updated Medications: VICODIN 5-500 MG TABS (HYDROCODONE-ACETAMINOPHEN) Take 1 tablet by mouth every 6 hours as needed for pain Prescriptions: VICODIN 5-500 MG TABS (HYDROCODONE-ACETAMINOPHEN) Take 1 tablet by mouth every 6 hours as needed for pain  #20 x 0   Entered and Authorized by:   Rhea Pink  DO   Signed by:   Rhea Pink  DO on 03/15/2010   Method used:   Telephoned to ...       [facility] [phone]* (retail)       [address]       [place], [place]  [address]       Ph: [phone]       Fax: [phone]   RxID:   [id number]

## 2010-05-24 NOTE — Assessment & Plan Note (Signed)
Summary: EST-6 WEEK RECHECK/CH   Vital Signs:  Patient profile:   65 year old female Height:      64 inches (162.56 cm) Weight:      189.03 pounds (85.92 kg) BMI:     32.56 Temp:     98 degrees F (36.67 degrees C) oral Pulse rate:   85 / minute BP sitting:   130 / 86  (right arm)  Vitals Entered By: Sander Nephew RN (Sep 15, 2009 1:34 PM) Is Patient Diabetic? Yes Did you bring your meter with you today? No Pain Assessment Patient in pain? no      Nutritional Status BMI of > 30 = obese CBG Result 92  Have you ever been in a relationship where you felt threatened, hurt or afraid?No   Does patient need assistance? Functional Status Self care Ambulation Normal Comments Check up.  Refill on Ambien.   Primary Care Provider:  Dawna Part MD   History of Present Illness: [name] is a 65 year old Female with PMH/problems as outlined in the EMR, who presents to the [facility] for:  1. DM/HTN/HL: doing meds as prescribed. No new complaints today.  2. Needs refills on her ambien.     Depression History:      The patient denies a depressed mood most of the day and a diminished interest in her usual daily activities.         Preventive Screening-Counseling & Management  Alcohol-Tobacco     Alcohol type: sometimes - wine     Smoking Status: current     Smoking Cessation Counseling: yes     Packs/Day: 0.25     Year Started: ABOUT AGE 68  Current Medications (verified): 1)  Metformin Hcl 1000 Mg  Tabs (Metformin Hcl) .... One Tab By Mouth Twice Daily 2)  Adult Aspirin Ec Low Strength 81 Mg  Tbec (Aspirin) .... One Tab Daily 3)  Ambien 5 Mg Tabs (Zolpidem Tartrate) .Marland Kitchen.. 1 Tablet At Bedtime As Needed. 4)  Ultram 50 Mg Tabs (Tramadol Hcl) .... One Tab As Required For Pain Up To Four Times A Day. 5)  Lisinopril 40 Mg Tabs (Lisinopril) .... Take 1 Tablet By Mouth Once A Day 6)  Amitriptyline Hcl 25 Mg Tabs (Amitriptyline Hcl) .... Take 1 Tablet By Mouth Once A Day in The  Evening Time. 7)  Lidex 0.05 % Crea (Fluocinonide) .... Apply A Thin Layer 2 - 4 Times A Day. 8)  Pravachol 40 Mg Tabs (Pravastatin Sodium) .... Take 1 Tablet By Mouth Once A Day 9)  Hydroxyzine Hcl 25 Mg Tabs (Hydroxyzine Hcl) .... Take 1 Tablet By Mouth Four Times A Day As Needed For Itching. 10)  Hydrochlorothiazide 25 Mg Tabs (Hydrochlorothiazide) .... Take 1 Tablet By Mouth Once A Day  Allergies (verified): 1)  ! Sulfa 2)  ! 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   Social History: Last updated: 01/28/2008 Pt smokes 1 ppd x 40 years.   Risk Factors: Exercise: yes (08/02/2009)  Risk Factors: Smoking Status: current (09/15/2009) Packs/Day: 0.25 (09/15/2009)  Review of Systems       as per hPI  Physical Exam  General:  alert and well-developed.   Head:  normocephalic and atraumatic.   Neck:  supple.   Lungs:  normal respiratory  effort and normal breath sounds.   Heart:  normal rate and regular rhythm.   Abdomen:  soft and non-tender.   Extremities:  no cyanosis, clubbing or edema  Neurologic:  non focal.  Psych:  normally interactive.     Impression & Recommendations:  Problem # 1:  DIABETES MELLITUS (ICD-250.00) A1c: 6.9 (08/02/2009 1:56:51 PM)  MICROALB/CR: 14.7 (01/28/2008 9:08:00 PM) LDL: 101 (02/04/2009 8:19:00 PM) EYE: Saw Dr. [name] Fitch recently. Found cataract, going back to see him in six months.  FOOT: yes (07/16/2008 9:26:19 AM) WEIGHT: 32.56 (09/15/2009 1:33:02 PM)   Her updated medication list for this problem includes:    Metformin Hcl 1000 Mg Tabs (Metformin hcl) ..... One tab by mouth twice daily    Adult Aspirin Ec Low Strength 81 Mg Tbec (Aspirin) ..... One tab daily    Lisinopril 40 Mg Tabs (Lisinopril)  .Marland Kitchen... Take 1 tablet by mouth once a day  Orders: Capillary Blood Glucose/CBG [id number]) Capillary Blood Glucose/CBG [id number])  Problem # 2:  HYPERTENSION (ICD-401.9) Slightly high blood pressure than ideal, will continue with meds and lifestyle modification. She is unwilling to add a new medication at this time.  Her updated medication list for this problem includes:    Lisinopril 40 Mg Tabs (Lisinopril) .Marland Kitchen... Take 1 tablet by mouth once a day    Hydrochlorothiazide 25 Mg Tabs (Hydrochlorothiazide) .Marland Kitchen... Take 1 tablet by mouth once a day  Problem # 3:  HYPERLIPIDEMIA (ICD-272.4) Continue with meds plus life style modifications.  Her updated medication list for this problem includes:    Pravachol 40 Mg Tabs (Pravastatin sodium) .Marland Kitchen... Take 1 tablet by mouth once a day  Complete Medication List: 1)  Metformin Hcl 1000 Mg Tabs (Metformin hcl) .... One tab by mouth twice daily 2)  Adult Aspirin Ec Low Strength 81 Mg Tbec (Aspirin) .... One tab daily 3)  Ambien 5 Mg Tabs (Zolpidem tartrate) .Marland Kitchen.. 1 tablet at bedtime as needed. 4)  Ultram 50 Mg Tabs (Tramadol hcl) .... One tab as required for pain up to four times a day. 5)  Lisinopril 40 Mg Tabs (Lisinopril) .... Take 1 tablet by mouth once a day 6)  Amitriptyline Hcl 25 Mg Tabs (Amitriptyline hcl) .... Take 1 tablet by mouth once a day in the evening time. 7)  Lidex 0.05 % Crea (Fluocinonide) .... Apply a thin layer 2 - 4 times a day. 8)  Pravachol 40 Mg Tabs (Pravastatin sodium) .... Take 1 tablet by mouth once a day 9)  Hydroxyzine Hcl 25 Mg Tabs (Hydroxyzine hcl) .... Take 1 tablet by mouth four times a day as needed for itching. 10)  Hydrochlorothiazide 25 Mg Tabs (Hydrochlorothiazide) .... Take 1 tablet by mouth once a day  Patient Instructions: 1)  Please schedule a follow-up appointment in 6 months. Prescriptions: AMBIEN 5 MG TABS (ZOLPIDEM TARTRATE) 1 tablet at bedtime as needed.  #30 x 6   Entered and Authorized by:   Dawna Part  MD   Signed by:   Dawna Part MD on 09/15/2009   Method used:   Reprint   RxID:   [id number] AMBIEN 5 MG TABS (ZOLPIDEM TARTRATE) 1 tablet at bedtime as needed.  #30 x 6   Entered and Authorized by:   Dawna Part MD   Signed by:   Dawna Part MD on 09/15/2009   Method used:   Print then Give to Patient   RxID:   [id number]   Prevention & Chronic Care Immunizations   Influenza vaccine: Not documented  Influenza vaccine deferral: Refused  (12/08/2008)    Tetanus booster: 11/23/2008: Td   Td booster deferral: Not indicated  (07/19/2009)    Pneumococcal vaccine: Not documented   Pneumococcal vaccine deferral: Refused  (12/08/2008)    H. zoster vaccine: Not documented   H. zoster vaccine deferral: Refused  (07/19/2009)  Colorectal Screening   Hemoccult: Not documented   Hemoccult action/deferral: Refused  (07/19/2009)    Colonoscopy: Not documented   Colonoscopy action/deferral: Refused  (07/19/2009)  Other Screening   Pap smear: Not documented   Pap smear action/deferral: Not indicated S/P hysterectomy  (12/08/2008)    Mammogram: Not documented   Mammogram action/deferral: Refused  (07/19/2009)    DXA bone density scan: Not documented   Smoking status: current  (09/15/2009)   Smoking cessation counseling: yes  (09/15/2009)  Diabetes Mellitus   HgbA1C: 6.9  (08/02/2009)   HgbA1C action/deferral: Ordered  (08/02/2009)    Eye exam: No diabetic retinopathy.     (07/22/2008)   Eye exam due: 07/2009    Foot exam: yes  (07/16/2008)   High risk foot: No  (09/15/2009)   Foot care education: Not documented   Foot exam due: 08/03/2010    Urine microalbumin/creatinine ratio: 14.7  (01/28/2008)    Diabetes flowsheet reviewed?: Yes   Progress toward A1C goal: At goal  Lipids   Total Cholesterol: 169  (02/04/2009)   LDL: 101  (02/04/2009)   LDL Direct: Not documented   HDL: 36  (02/04/2009)   Triglycerides: 161  (02/04/2009)    SGOT (AST): 20   (08/03/2009)   SGPT (ALT): 28  (08/03/2009)   Alkaline phosphatase: 77  (08/03/2009)   Total bilirubin: 0.4  (08/03/2009)    Lipid flowsheet reviewed?: Yes   Progress toward LDL goal: Unchanged  Hypertension   Last Blood Pressure: 130 / 86  (09/15/2009)   Serum creatinine: 0.97  (08/04/2009)   Serum potassium 4.0  (08/04/2009)    Hypertension flowsheet reviewed?: Yes   Progress toward BP goal: Unchanged  Self-Management Support :   Personal Goals (by the next clinic visit) :     Personal A1C goal: 7  (03/15/2009)     Personal blood pressure goal: 130/80  (03/15/2009)     Personal LDL goal: 100  (03/15/2009)    Patient will work on the following items until the next clinic visit to reach self-care goals:     Medications and monitoring: take my medicines every day, check my blood sugar, bring all of my medications to every visit, examine my feet every day  (09/15/2009)     Eating: drink diet soda or water instead of juice or soda, eat more vegetables, eat foods that are low in salt, eat baked foods instead of fried foods, eat fruit for snacks and desserts, limit or avoid alcohol  (09/15/2009)     Activity: take a 30 minute walk every day, take the stairs instead of the elevator  (09/15/2009)     Other: walks up and down street,work in yard,cut grass  (08/02/2009)    Diabetes self-management support: Written self-care plan, Education handout, Pre-printed educational material  (09/15/2009)   Diabetes care plan printed   Diabetes education handout printed   Last diabetes self-management training by diabetes educator: 10/01/2007    Hypertension self-management support: Written self-care plan, Education handout, Pre-printed educational material  (09/15/2009)   Hypertension self-care plan printed.   Hypertension education handout printed    Lipid self-management support: Education handout, Pre-printed educational material, Written self-care plan  (  09/15/2009)   Lipid self-care plan  printed.   Lipid education handout printed     Vital Signs:  Patient profile:   65 year old female Height:      64 inches (162.56 cm) Weight:      189.03 pounds (85.92 kg) BMI:     32.56 Temp:     98 degrees F (36.67 degrees C) oral Pulse rate:   85 / minute BP sitting:   130 / 86  (right arm)  Vitals Entered By: Sander Nephew RN (Sep 15, 2009 1:34 PM)  Last LDL:                                                 101 (02/04/2009 8:19:00 PM)        Diabetic Foot Exam Last Podiatry Exam Date: 12/19/2007 Pulse Check          Right Foot          Left Foot Posterior Tibial:        3+            3+ Dorsalis Pedis:        3+            3+  High Risk Feet? No   10-g (5.07) Semmes-Weinstein Monofilament Test Performed by: Vernice Mannina MD          Right Foot          Left Foot Site 1         normal         normal Site 4         normal         normal Site 5         normal         normal Site 6         normal         normal

## 2010-05-24 NOTE — Assessment & Plan Note (Signed)
Summary: rash/gg   Vital Signs:  Patient profile:   65 year old female Height:      64 inches (162.56 cm) Weight:      187.1 pounds (85.05 kg) BMI:     32.23 Temp:     98.0 degrees F oral Pulse rate:   104 / minute BP sitting:   132 / 87  (right arm) Cuff size:   large  Vitals Entered By: Morrison Old RN (March 07, 2010 3:51 PM) CC: Red rash on back to right breast; started  last Thursday.  Itches at time., Hypertension Management Is Patient Diabetic? Yes Did you bring your meter with you today? No Pain Assessment Patient in pain? yes     Location: back to right breast Intensity: 4/5 Type: sharp/aching Onset of pain  Intermittent Nutritional Status BMI of > 30 = obese CBG Result 181  Have you ever been in a relationship where you felt threatened, hurt or afraid?No   Does patient need assistance? Functional Status Self care Ambulation Normal   Diabetic Foot Exam Last Podiatry Exam Date: 03/07/2010  Foot Inspection Is there a history of a foot ulcer?              No Is there a foot ulcer now?              No Can the patient see the bottom of their feet?          Yes Are the shoes appropriate in style and fit?          Yes Is there swelling or an abnormal foot shape?          No Are the toenails long?                No Are the toenails thick?                No Are the toenails ingrown?              No Is there heavy callous build-up?              No  Diabetic Foot Care Education Patient educated on appropriate care of diabetic feet.  Pulse Check          Right Foot          Left Foot Dorsalis Pedis:        normal            normal Comments: Dry skin. High Risk Feet? No   10-g (5.07) Semmes-Weinstein Monofilament Test Performed by: Morrison Old RN          Right Foot          Left Foot Visual Inspection     normal           normal   Primary Care Provider:  Epimenio Sarin MD  CC:  Red rash on back to right breast; started  last Thursday.  Itches at time.  and Hypertension Management.  History of Present Illness: [name] is a 65yo W with DM2, HTN, HL, MRSA infection who presented with a rash on her back and under her breast. On Thursday (5 days ago) she noted bumps on her back which was spreading to the the front and under her breast. On Friday the rash  started to become very painful ( throbbing a nagging) and itchy.  She denies any fevers or chills,  sick contact,  changes in medication no changes in detergant.  This is her first episode.    Note: She has a history of MRSA abscess on her thigh in 2009, at which time she was diagnosed with diabetes. Her DM had been under good control (Hb A1C 7.4 12/2009)  Depression History:      The patient denies a depressed mood most of the day and a diminished interest in her usual daily activities.         Hypertension History:      Positive major cardiovascular risk factors include female age 65 years old or older, diabetes, hyperlipidemia, hypertension, and current tobacco user.               Preventive Screening-Counseling & Management  Alcohol-Tobacco     Alcohol type: sometimes - wine     Smoking Status: current     Smoking Cessation Counseling: yes     Packs/Day: 1 pk per week     Year Started: ABOUT AGE 65  Caffeine-Diet-Exercise     Does Patient Exercise: yes     Type of exercise: YARD WORK     Times/week:  4  Allergies: 1)  ! Sulfa 2)  ! Actos (Pioglitazone Hcl)  Physical Exam  General:  Well-developed,well-nourished,in no acute distress; alert,appropriate and cooperative throughout examination Lungs:  Normal respiratory effort, chest expands symmetrically. Lungs are clear to auscultation, no crackles or wheezes. Heart:  Normal rate and regular rhythm. S1 and S2 normal without gallop, murmur, click, rub or other extra sounds. Abdomen:  Bowel sounds positive,abdomen soft and non-tender without masses, organomegaly or hernias noted. Pulses:  R and L dorsalis pedis and posterior  tibial pulses are full and equal bilaterally Extremities:  No clubbing, cyanosis, edema, or deformity noted with normal full range of motion of all joints.   Skin:  maculopapular rash in a dermatom like distribution (T5) on the right side. Not tender on palaption. No visible vesicles appreciated. No other suscicious lesion noted Cervical Nodes:  No lymphadenopathy noted Axillary Nodes:  No palpable lymphadenopathy   Impression & Recommendations:  Problem # 1:  SKIN RASH (ICD-782.1) Most likely Shingels. Patient was started on a 7 day course of Valacyclovir and symptomatic management with benadryl and ultram for pain.   Her updated medication list for this problem includes:    Lidex 0.05 % Crea (Fluocinonide) .Marland Kitchen... Apply a thin layer 2 - 4 times a day.  Problem # 2:  DIABETES MELLITUS (ICD-250.00) Patient last HgbA1c was 7.4 (12/2009). Well controlled. Continue on current regimen.   Her updated medication list for this problem includes:    Metformin Hcl 1000 Mg Tabs (Metformin hcl) ..... One tab by mouth twice daily    Adult Aspirin Ec Low Strength 81 Mg Tbec (Aspirin) ..... One tab daily    Lisinopril 40 Mg Tabs (Lisinopril) .Marland Kitchen... Take 1 tablet by mouth once a day  Orders: Capillary Blood Glucose/CBG [id number])  Labs Reviewed: Creat: 1.16 (01/18/2010)     Last Eye Exam: No diabetic retinopathy.    (07/22/2008) Reviewed HgBA1c results: 7.4 (01/07/2010)  6.9 (08/02/2009)  Complete Medication List: 1)  Metformin Hcl 1000 Mg Tabs (Metformin hcl) .... One tab by mouth twice daily 2)  Adult Aspirin Ec Low Strength 81 Mg Tbec (Aspirin) .... One tab daily 3)  Ambien 5 Mg Tabs (Zolpidem tartrate) .Marland Kitchen.. 1 tablet at bedtime as needed. 4)  Ultram 50 Mg Tabs (Tramadol hcl) .... One tab as required for pain up to four times a day. 5)  Lisinopril 40 Mg Tabs (Lisinopril) .Marland KitchenMarland KitchenMarland Kitchen  Take 1 tablet by mouth once a day 6)  Amitriptyline Hcl 25 Mg Tabs (Amitriptyline hcl) .... Take 1 tablet by mouth once a  day in the evening time. 7)  Lidex 0.05 % Crea (Fluocinonide) .... Apply a thin layer 2 - 4 times a day. 8)  Pravachol 40 Mg Tabs (Pravastatin sodium) .... Take 1 tablet by mouth once a day 9)  Hydroxyzine Hcl 25 Mg Tabs (Hydroxyzine hcl) .... Take 1 tablet by mouth four times a day as needed for itching. 10)  Hydrochlorothiazide 25 Mg Tabs (Hydrochlorothiazide) .... Take 1 tablet by mouth once a day 11)  Loratadine 10 Mg Tabs (Loratadine) .... Take 1 tablet by mouth once a day 12)  Cheratussin Ac 100-10 Mg/75ml Syrp (Guaifenesin-codeine) .... 2 tsp every 6 hours as needed for cough 13)  Valacyclovir Hcl 1 Gm Tabs (Valacyclovir hcl) .... Take one tablet by mouth three times a day for 7 days and then stop 14)  Ultram 50 Mg Tabs (Tramadol hcl) .... Take one tablet by mouth q6hours as needed for pain 15)  Diphenhydramine Hcl 25 Mg Tabs (Diphenhydramine hcl) .... Take one tablet by mouth q6hours as needed for itching  Hypertension Assessment/Plan:      The patient's hypertensive risk group is category C: Target organ damage and/or diabetes.  Her calculated 10 year risk of coronary heart disease is > 32 %.  Today's blood pressure is 132/87.    Patient Instructions: 1)  Please schedule a follow-up appointment in 2 months. 2)  - Keep the rash covered. 3)  - Do not touch or scratch the rash 4)  - Wash your hands often to prevent the spread  5)  - until your rash has developed crust, avoid contact with 6)    1. pregnant woman whohave never had chickenpox or the varicella vaccine 7)  2. premature or low birth weight infants 8)  3. immunocompromised person ( person who is receiving chemotherapy, organ transplant or HIV) Prescriptions: DIPHENHYDRAMINE HCL 25 MG TABS (DIPHENHYDRAMINE HCL) Take one tablet by mouth q6hours as needed for itching  #30 x 0   Entered and Authorized by:   Rosalia Hammers MD   Signed by:   Rosalia Hammers MD on 03/07/2010   Method used:   Electronically to        [facility] [phone]* (retail)       [place], [place]  [address]       Ph: [phone]       Fax: [phone]   RxID:   [id number] ULTRAM 50 MG TABS (TRAMADOL HCL) Take one tablet by mouth q6hours as needed for pain  #30 x 0   Entered and Authorized by:   Rosalia Hammers MD   Signed by:   Rosalia Hammers MD on 03/07/2010   Method used:   Electronically to        [facility] [phone]* (retail)       [place], [place]  [address]       Ph: [phone]       Fax: [phone]   RxIDCE:[id number] VALACYCLOVIR HCL 1 GM TABS (VALACYCLOVIR HCL) Take one tablet by mouth three times a day for 7 days and then stop  #21 x 0   Entered and Authorized by:   Rosalia Hammers MD   Signed by:   Rosalia Hammers MD on 03/07/2010  Method used:   Electronically to        [facility] [phone]* (retail)       [address]       [place],   [address]       Ph: [phone]       Fax: [phone]   RxID:   [id number]    Orders Added: 1)  Capillary Blood Glucose/CBG [82948] 2)  Est. Patient Level II [id number]    Prevention & Chronic Care Immunizations   Influenza vaccine: Fluvax 3+  (01/07/2010)   Influenza vaccine deferral: Refused  (01/07/2010)    Tetanus booster: 11/23/2008: Td   Td booster deferral: Not indicated  (07/19/2009)    Pneumococcal vaccine: Not documented   Pneumococcal vaccine deferral: Refused  (12/08/2008)    H. zoster vaccine: Not documented   H. zoster vaccine deferral: Refused  (07/19/2009)  Colorectal Screening   Hemoccult: Not documented   Hemoccult action/deferral: Refused  (07/19/2009)    Colonoscopy: Not documented   Colonoscopy action/deferral: Refused  (07/19/2009)  Other Screening   Pap smear: Not documented   Pap smear action/deferral: Not indicated S/P hysterectomy  (12/08/2008)    Mammogram: Not documented   Mammogram action/deferral: Refused  (01/07/2010)    DXA bone density scan: Not  documented   Smoking status: current  (03/07/2010)   Smoking cessation counseling: yes  (03/07/2010)  Diabetes Mellitus   HgbA1C: 7.4  (01/07/2010)   HgbA1C action/deferral: Ordered  (08/02/2009)    Eye exam: No diabetic retinopathy.     (07/22/2008)   Eye exam due: 07/2009    Foot exam: yes  (02/22/2010)   Foot exam action/deferral: Do today   High risk foot: No  (03/07/2010)   Foot care education: Done  (03/07/2010)   Foot exam due: 08/03/2010    Urine microalbumin/creatinine ratio: 14.7  (01/28/2008)  Lipids   Total Cholesterol: 244  (01/07/2010)   Lipid panel action/deferral: Lipid Panel ordered   LDL: 145  (01/07/2010)   LDL Direct: Not documented   HDL: 33  (01/07/2010)   Triglycerides: 332  (01/07/2010)    SGOT (AST): 20  (08/03/2009)   SGPT (ALT): 28  (08/03/2009)   Alkaline phosphatase: 77  (08/03/2009)   Total bilirubin: 0.4  (08/03/2009)  Hypertension   Last Blood Pressure: 132 / 87  (03/07/2010)   Serum creatinine: 1.16  (01/18/2010)   BMP action: Ordered   Serum potassium 4.2  (01/18/2010)  Self-Management Support :   Personal Goals (by the next clinic visit) :     Personal A1C goal: 7  (03/15/2009)     Personal blood pressure goal: 130/80  (03/15/2009)     Personal LDL goal: 100  (03/15/2009)    Patient will work on the following items until the next clinic visit to reach self-care goals:     Medications and monitoring: take my medicines every day, bring all of my medications to every visit, examine my feet every day  (03/07/2010)     Eating: drink diet soda or water instead of juice or soda, eat more vegetables, use fresh or frozen vegetables, eat foods that are low in salt, eat baked foods instead of fried foods, eat fruit for snacks and desserts  (03/07/2010)     Activity: take a 30 minute walk every day  (03/07/2010)     Other: walks up and down street,work in yard,cut grass  (08/02/2009)    Diabetes self-management support: Written self-care  plan  (03/07/2010)   Diabetes care plan  printed   Last diabetes self-management training by diabetes educator: 10/01/2007    Hypertension self-management support: Written self-care plan  (03/07/2010)   Hypertension self-care plan printed.    Lipid self-management support: Written self-care plan  (03/07/2010)   Lipid self-care plan printed.   Nursing Instructions:

## 2010-05-25 ENCOUNTER — Encounter: Payer: Self-pay | Admitting: *Deleted

## 2010-05-25 ENCOUNTER — Ambulatory Visit (INDEPENDENT_AMBULATORY_CARE_PROVIDER_SITE_OTHER): Payer: Self-pay | Admitting: Dietician

## 2010-05-25 ENCOUNTER — Ambulatory Visit: Admission: EM | Admit: 2010-05-25 | Payer: Self-pay | Source: Home / Self Care

## 2010-05-25 ENCOUNTER — Emergency Department (HOSPITAL_COMMUNITY)
Admission: EM | Admit: 2010-05-25 | Discharge: 2010-05-25 | Discharge status: Against Medical Advice | Payer: Self-pay | Attending: Emergency Medicine | Admitting: Emergency Medicine

## 2010-05-25 VITALS — Wt 180.5 lb

## 2010-05-25 DIAGNOSIS — E119 Type 2 diabetes mellitus without complications: Secondary | ICD-10-CM

## 2010-05-25 DIAGNOSIS — E1169 Type 2 diabetes mellitus with other specified complication: Secondary | ICD-10-CM | POA: Insufficient documentation

## 2010-05-25 LAB — GLUCOSE, CAPILLARY: Glucose-Capillary: >600 mg/dL (ref 70–99)

## 2010-05-25 NOTE — Progress Notes (Signed)
Pt seen by [name] in office and CBG was greater than 500.   C/o pain from shingles x 2 weeks. Unable to see in clinic today so pt sent to ED for evaluation. Pt to ED by D Ovid Curd

## 2010-05-25 NOTE — Progress Notes (Signed)
Diabetes Self-Management Training (DSMT)  Initial Visit  05/25/2010 Ms. [employer], identified by name and date of birth, is a 65 y.o. female with Type 2 Diabetes. Year of diabetes diagnosis: 2009 Other persons present: none  ASSESSMENT Patient concerns are Nutrition/meal planning and Glycemic control.  Weight 180 lb 8 oz (81.874 kg). There is no height on file to calculate BMI. No results found for this basename: LDLCALC   Lab Results  Component Value Date   HGBA1C 9.3 03/25/2010    A1C in past 6 months? Yes.  Less than 7%? No LDL in past year? Yes.  Less than 100 mg/dL? No Microalbumin ratio in past year? No. Patient taking ACE or ARB? Yes. Blood pressure less than 130/80? No.  Sent note to MD. Foot exam in last year? No. Sent note to MD. Eye exam in past year? No.  Reminded patient to schedule eye exam. Tobacco use? Yes.  Smoking cessation offered? No Pneumovax? No Flu vaccine? No Asprin? Yes  Family history of diabetes: No  Support systems: lives alone Did not address today due to patient complaint of pain  Special needs: Unable to determine  Prior DM Education: Yes   Medications See Medications list.  Has adequate knowledge of current medication for diabetes.  Patients belief/attitude about diabetes: Diabetes can be controlled.  Self foot exams daily: not addressed today due to patient pain   Self-Monitoring Patient reported Frequency of testing: 1-2 times/day Breakfast: 200's Lunch: 200's Supper: 200's Bedtime: 200's  Hyperglycemia: Yes Daily Hypoglycemia: No   Meal Planning Some knowledge   Assessment comments: Patient with blood sugar of > 500 per CDE office blood glucose meter and in pain so visit kept brief today.    INDIVIDUAL DIABETES EDUCATION PLAN:  Nutrition management Medication Acute complication: _______________________________________________________________________  Intervention TOPICS COVERED TODAY:  Medication   Taught/evaluated insulin injection, site rotation, insulin storage and needle disposal.  PATIENTS GOALS/PLAN (copy and paste in patient instructions so patient receives a copy): 1.  Learning Objective:        2.  Behavioral Objective:         Medications: To improve blood glucose levels, I will consider insulin if every needed to temporarily address my high blood sugars Never 0%  Personalized Follow-Up Plan for Ongoing Self Management Support:  [place] ______________________________________________________________________   Outcomes Expected outcomes: Demonstrated interest in learning.Expect positive changes in lifestyle.  Self-care Barriers: Unable to determine due to patient pain today  Education material provided: none  Patient to contact team via Phone if problems or questions.  Time in: 1400     Time out: 1430  Future DSMT - 2 wk's  Patient walked to emergency room today per attending/triage nurse instructions. Will call patient to follow up on diabetes self care plan.   Barnabas Harries Eldred Manges, Zella Richer 05/25/2010

## 2010-05-25 NOTE — Telephone Encounter (Signed)
error 

## 2010-05-26 ENCOUNTER — Emergency Department (HOSPITAL_COMMUNITY)
Admission: EM | Admit: 2010-05-26 | Discharge: 2010-05-26 | Disposition: A | Discharge status: Home / Self Care | Payer: Self-pay | Attending: Emergency Medicine | Admitting: Emergency Medicine

## 2010-05-26 DIAGNOSIS — M79609 Pain in unspecified limb: Secondary | ICD-10-CM | POA: Insufficient documentation

## 2010-05-26 DIAGNOSIS — Z79899 Other long term (current) drug therapy: Secondary | ICD-10-CM | POA: Insufficient documentation

## 2010-05-26 DIAGNOSIS — E119 Type 2 diabetes mellitus without complications: Secondary | ICD-10-CM | POA: Insufficient documentation

## 2010-05-26 DIAGNOSIS — B0229 Other postherpetic nervous system involvement: Secondary | ICD-10-CM | POA: Insufficient documentation

## 2010-05-26 DIAGNOSIS — Z8614 Personal history of Methicillin resistant Staphylococcus aureus infection: Secondary | ICD-10-CM | POA: Insufficient documentation

## 2010-05-26 DIAGNOSIS — M129 Arthropathy, unspecified: Secondary | ICD-10-CM | POA: Insufficient documentation

## 2010-05-26 DIAGNOSIS — I10 Essential (primary) hypertension: Secondary | ICD-10-CM | POA: Insufficient documentation

## 2010-05-26 DIAGNOSIS — M546 Pain in thoracic spine: Secondary | ICD-10-CM | POA: Insufficient documentation

## 2010-05-26 LAB — BASIC METABOLIC PANEL
Calcium: 9.6 mg/dL (ref 8.4–10.5)
GFR calc Af Amer: 53 mL/min — ABNORMAL LOW (ref >60)
GFR calc non Af Amer: 44 mL/min — ABNORMAL LOW (ref >60)
Potassium: 4.4 mEq/L (ref 3.5–5.1)
Sodium: 132 mEq/L — ABNORMAL LOW (ref 135–145)

## 2010-05-26 LAB — GLUCOSE, CAPILLARY: Glucose-Capillary: 483 mg/dL — ABNORMAL HIGH (ref 70–99)

## 2010-05-26 NOTE — Assessment & Plan Note (Signed)
Summary: EST-1 MONTH F/U VISIT/CH   Vital Signs:  Patient profile:   65 year old female Height:      64 inches (162.56 cm) Weight:      188.9 pounds (85.86 kg) BMI:     32.54 Temp:     96.7 degrees F (35.94 degrees C) oral Pulse rate:   93 / minute BP sitting:   137 / 96  (left arm)  Vitals Entered By: Hilda Blades Ditzler RN (April 25, 2010 2:03 PM) CC: Depression Is Patient Diabetic? Yes Did you bring your meter with you today? No Pain Assessment Patient in pain? no      Nutritional Status BMI of > 30 = obese Nutritional Status Detail appetite good CBG Result 305  Have you ever been in a relationship where you felt threatened, hurt or afraid?denies   Does patient need assistance? Functional Status Self care Ambulation Normal Comments FU - better.   Primary Care Provider:  Epimenio Sarin MD  CC:  Depression.  History of Present Illness: 65yo W with DM2, HTN, HL who presents for follow-up of: 1. shingles/acute herpetic neuralgia. Pain resolved after starting gabapentin last month. There is still a small amount of healing rash visible on her back but it is resolving. She has been taking 300mg  daily and did not feel that it was necessary to titrate up the dose. She will finish the 4 weeks of gabapentin therapy.  2. Diabetes. Previously controlled with metformin (A1C typically around 7) but last A1C in December 2011 jumped to 9.3. Of note this increased in A1C corresponds to the period of time when she was suffering from shingles and, although she was not treated with steroids, she was experiencing significant inflammation and was also distracted from her diabetes control. She says that her blood glucose continues to be high, typically in the 200s, which she attributes to poor diet choices during the holidays. She does not want to start another diabetes medication, says that she "feels fine" even when her glucose is high, and would like to focus on diet rather than medications to  improve her diabetes.   Depression History:      The patient denies a depressed mood most of the day and a diminished interest in her usual daily activities.         Preventive Screening-Counseling & Management  Alcohol-Tobacco     Alcohol type: sometimes - wine     Smoking Status: current     Smoking Cessation Counseling: yes     Packs/Day: 2  pk per week     Year Started: ABOUT AGE 65  Caffeine-Diet-Exercise     Does Patient Exercise: yes     Type of exercise: YARD WORK     Times/week:  4  Current Medications (verified): 1)  Metformin Hcl 1000 Mg  Tabs (Metformin Hcl) .... One Tab By Mouth Twice Daily 2)  Adult Aspirin Ec Low Strength 81 Mg  Tbec (Aspirin) .... One Tab Daily 3)  Ambien 5 Mg Tabs (Zolpidem Tartrate) .Marland Kitchen.. 1 Tablet At Bedtime As Needed. 4)  Ultram 50 Mg Tabs (Tramadol Hcl) .... One Tab As Required For Pain Up To Four Times A Day. 5)  Lisinopril 40 Mg Tabs (Lisinopril) .... Take 1 Tablet By Mouth Once A Day 6)  Amitriptyline Hcl 25 Mg Tabs (Amitriptyline Hcl) .... Take 1 Tablet By Mouth Once A Day in The Evening Time. 7)  Lidex 0.05 % Crea (Fluocinonide) .... Apply A Thin Layer 2 - 4 Times  A Day. 8)  Pravachol 40 Mg Tabs (Pravastatin Sodium) .... Take 1 Tablet By Mouth Once A Day 9)  Hydroxyzine Hcl 25 Mg Tabs (Hydroxyzine Hcl) .... Take 1 Tablet By Mouth Four Times A Day As Needed For Itching. 10)  Hydrochlorothiazide 25 Mg Tabs (Hydrochlorothiazide) .... Take 1 Tablet By Mouth Once A Day 11)  Loratadine 10 Mg Tabs (Loratadine) .... Take 1 Tablet By Mouth Once A Day 12)  Cheratussin Ac 100-10 Mg/70ml Syrp (Guaifenesin-Codeine) .... 2 Tsp Every 6 Hours As Needed For Cough 13)  Diphenhydramine Hcl 25 Mg Tabs (Diphenhydramine Hcl) .... Take One Tablet By Mouth Q6hours As Needed For Itching 14)  Gabapentin 300 Mg Caps (Gabapentin) .... Take One Tablet Once The First Day, Take One Tablet Twice The Second Day, Take One Tablet Three Times Daily Thereafter  Allergies: 1)   ! Sulfa 2)  ! 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   Social History: Last updated: 04/25/2010 Pt smokes 1 ppd x 40 years.  Now smokes 1 pack per week  Unemployed, on SSI Divorced No children  Lives alone. Describes considerable recent stress related to taking care of a neighbor.    Social History: Pt smokes 1 ppd x 40 years.  Now smokes 1 pack per week  Unemployed, on SSI Divorced No children  Lives alone. Describes considerable recent stress related to taking care of a neighbor.  Packs/Day:  2  pk per week  Review of Systems      See HPI General:  Denies chills, fever, loss of appetite, and weakness. Eyes:  Denies blurring. ENT:  Denies nasal congestion and sore throat. CV:  Denies chest pain or discomfort. Resp:  Denies cough and shortness of breath. GI:  Denies abdominal pain and change in bowel habits. GU:  Denies dysuria. Derm:  Complains of rash. Neuro:  Denies numbness and tingling. Psych:  Denies depression. Endo:  Denies excessive thirst and polyuria.  Physical Exam  General:  alert and cooperative to examination.   Head:  normocephalic and atraumatic.   Eyes:  vision grossly intact, pupils equal, pupils round, and pupils reactive to light.   Mouth:  pharynx pink and moist and teeth missing.   Neck:  supple and no masses.   Lungs:  normal respiratory effort, normal breath sounds, no crackles, and no wheezes.   Heart:  normal rate, regular rhythm, no murmur, no gallop, and no rub.   Abdomen:  soft, non-tender, normal bowel sounds, and no distention.   Msk:  normal ROM.   Pulses:  R dorsalis pedis normal and L dorsalis pedis normal.   Extremities:  No edema.  Neurologic:  alert & oriented X3, cranial nerves  grossly intact, strength normal in all extremities, and sensation intact to light touch.   Skin:  turgor normal. Resolving hyperpigmentation on back corresponding to location of shingles rash.  Cervical Nodes:  no anterior cervical adenopathy.   Psych:  Oriented X3, normally interactive, and not depressed appearing. Appears somewhat stressed.   Diabetes Management Exam:    Foot Exam (with socks and/or shoes not present):       Sensory-Monofilament:          Left foot: normal          Right foot: normal   Impression &  Recommendations:  Problem # 1:  HERPES ZOSTER (ICD-053.9) Assessment Improved Pain has resolved. Location of rash demonstrates some postinflammatory hyperpigmentation but is well healed.   Problem # 2:  DIABETES MELLITUS (ICD-250.00) Diabetes control has deteriorated over the past few months. This deterioration can probably be attributed to both the recent inflammation from shingles and poor diet choices recently. [name] would like to avoid taking more medications if possible. She said that she would like a refresher in diabetes nutrition information. I will refer her again to [facility]ab[facility] Harries. Patient agrees to work on improving her diet. I emphasized that, although she may "feel fine" with high blood sugar, improved glucose control is extremely important in maintaining her long term health. Will recheck her A1C in 2 months and assess control after making these diet/lifestyle changes. If glycemic control is still poor, may revisit the idea of adding another medication.   Her updated medication list for this problem includes:    Metformin Hcl 1000 Mg Tabs (Metformin hcl) ..... One tab by mouth twice daily    Adult Aspirin Ec Low Strength 81 Mg Tbec (Aspirin) ..... One tab daily    Lisinopril 40 Mg Tabs (Lisinopril) .Marland Kitchen... Take 1 tablet by mouth once a day  Orders: Capillary Blood Glucose/CBG [id number]) Nutrition Referral (Nutrition) T-Urine Microalbumin w/creat. ratio  [phone])  Problem # 3:  HYPERTENSION (ICD-401.9) Stable. Continue current management.   Her updated medication list for this problem includes:    Lisinopril 40 Mg Tabs (Lisinopril) .Marland Kitchen... Take 1 tablet by mouth once a day    Hydrochlorothiazide 25 Mg Tabs (Hydrochlorothiazide) .Marland Kitchen... Take 1 tablet by mouth once a day  Complete Medication List: 1)  Metformin Hcl 1000 Mg Tabs (Metformin hcl) .... One tab by mouth twice daily 2)  Adult Aspirin Ec Low Strength 81 Mg Tbec (Aspirin) .... One tab daily 3)  Ambien 5 Mg Tabs (Zolpidem tartrate) .Marland Kitchen.. 1 tablet at bedtime as needed. 4)  Ultram 50 Mg Tabs (Tramadol hcl) .... One tab as required for pain up to four times a day. 5)  Lisinopril 40 Mg Tabs (Lisinopril) .... Take 1 tablet by mouth once a day 6)  Amitriptyline Hcl 25 Mg Tabs (Amitriptyline hcl) .... Take 1 tablet by mouth once a day in the evening time. 7)  Lidex 0.05 % Crea (Fluocinonide) .... Apply a thin layer 2 - 4 times a day. 8)  Pravachol 40 Mg Tabs (Pravastatin sodium) .... Take 1 tablet by mouth once a day 9)  Hydroxyzine Hcl 25 Mg Tabs (Hydroxyzine hcl) .... Take 1 tablet by mouth four times a day as needed for itching. 10)  Hydrochlorothiazide 25 Mg Tabs (Hydrochlorothiazide) .... Take 1 tablet by mouth once a day 11)  Loratadine 10 Mg Tabs (Loratadine) .... Take 1 tablet by mouth once a day 12)  Cheratussin Ac 100-10 Mg/77ml Syrp (Guaifenesin-codeine) .... 2 tsp every 6 hours as needed for cough 13)  Diphenhydramine Hcl 25 Mg Tabs (Diphenhydramine hcl) .... Take one tablet by mouth q6hours as needed for itching 14)  Gabapentin 300 Mg Caps (Gabapentin) .... Take one tablet once the first day, take one tablet twice the second day, take one tablet three times daily thereafter  Other Orders: T-Hemoccult Card-Multiple (take home) [id number])  Patient Instructions: 1)  Please schedule a follow-up appointment in 2 months. 2)  Please make an appointment with Barnabas Harries for  diabetes and nutrition education.  3)  Check your blood sugars regularly. If your readings are usually above :  or below 70 you should contact our office.   Orders Added: 1)  Capillary Blood Glucose/CBG [82948] 2)  T-Hemoccult Card-Multiple (take home) [[id number]] 3)  Nutrition Referral [Nutrition] 4)  T-Urine Microalbumin w/creat. ratio [[id number]] 5)  Est. Patient Level IV [id number]   Process Orders Check Orders Results:     [facility]: [id number] not required for this insurance Tests Sent for requisitioning (April 26, 2010 12:23 PM):     04/25/2010: [facility] -- T-Urine Microalbumin w/creat. ratio [[id number]] (signed)     Prevention & Chronic Care Immunizations   Influenza vaccine: Fluvax 3+  (01/07/2010)   Influenza vaccine deferral: Refused  (01/07/2010)    Tetanus booster: 11/23/2008: Td   Td booster deferral: Not indicated  (07/19/2009)    Pneumococcal vaccine: Not documented   Pneumococcal vaccine deferral: Refused  (12/08/2008)    H. zoster vaccine: Not documented   H. zoster vaccine deferral: Refused  (07/19/2009)  Colorectal Screening   Hemoccult: Not documented   Hemoccult action/deferral: Refused  (07/19/2009)    Colonoscopy: Not documented   Colonoscopy action/deferral: Refused  (07/19/2009)  Other Screening   Pap smear: Not documented   Pap smear action/deferral: Not indicated S/P hysterectomy  (12/08/2008)    Mammogram: Not documented   Mammogram action/deferral: Refused  (01/07/2010)    DXA bone density scan: Not documented   Smoking status: current  (04/25/2010)   Smoking cessation counseling: yes  (04/25/2010)  Diabetes Mellitus   HgbA1C: 9.3  (03/25/2010)   HgbA1C action/deferral: Ordered  (08/02/2009)    Eye exam: No diabetic retinopathy.     (07/22/2008)   Eye exam due: 07/2009    Foot exam: yes  (04/25/2010)   Foot exam action/deferral: Do today   High risk foot: Yes  (04/25/2010)   Foot care  education: Done  (03/07/2010)   Foot exam due: 08/03/2010    Urine microalbumin/creatinine ratio: 14.7  (01/28/2008)    Diabetes flowsheet reviewed?: Yes   Progress toward A1C goal: Deteriorated  Lipids   Total Cholesterol: 244  (01/07/2010)   Lipid panel action/deferral: Lipid Panel ordered   LDL: 145  (01/07/2010)   LDL Direct: Not documented   HDL: 33  (01/07/2010)   Triglycerides: 332  (01/07/2010)    SGOT (AST): 20  (08/03/2009)   SGPT (ALT): 28  (08/03/2009)   Alkaline phosphatase: 77  (08/03/2009)   Total bilirubin: 0.4  (08/03/2009)    Lipid flowsheet reviewed?: Yes   Progress toward LDL goal: Deteriorated  Hypertension   Last Blood Pressure: 137 / 96  (04/25/2010)   Serum creatinine: 1.16  (01/18/2010)   BMP action: Ordered   Serum potassium 4.2  (01/18/2010)    Hypertension flowsheet reviewed?: Yes   Progress toward BP goal: Unchanged  Self-Management Support :   Personal Goals (by the next clinic visit) :     Personal A1C goal: 7  (03/15/2009)     Personal blood pressure goal: 130/80  (03/15/2009)     Personal LDL goal: 100  (03/15/2009)    Patient will work on the following items until the next clinic visit to reach self-care goals:     Medications and monitoring: take my medicines every day, check my blood sugar, check my blood pressure, bring all of my medications to every visit, weigh myself weekly, examine my feet every day  (04/25/2010)     Eating: drink diet soda or water instead of juice or soda, eat more vegetables, use fresh or frozen vegetables, eat foods that  are low in salt, eat fruit for snacks and desserts  (04/25/2010)     Activity: take a 30 minute walk every day, take the stairs instead of the elevator  (04/25/2010)     Other: walks up and down street,work in yard,cut grass  (08/02/2009)    Diabetes self-management support: Written self-care plan, Education handout, Resources for patients handout  (04/25/2010)   Diabetes care plan printed    Diabetes education handout printed   Last diabetes self-management training by diabetes educator: 10/01/2007   Referred.    Hypertension self-management support: Written self-care plan, Education handout, Resources for patients handout  (04/25/2010)   Hypertension self-care plan printed.   Hypertension education handout printed    Lipid self-management support: Written self-care plan, Education handout, Resources for patients handout  (04/25/2010)   Lipid self-care plan printed.   Lipid education handout printed      Resource handout printed.   Last LDL:                                                 145 (01/07/2010 10:45:00 PM)        Diabetic Foot Exam Foot Inspection Is there a history of a foot ulcer?              No Is there a foot ulcer now?              No Can the patient see the bottom of their feet?          Yes Are the shoes appropriate in style and fit?          Yes Is there swelling or an abnormal foot shape?          No Are the toenails long?                No Are the toenails thick?                No Are the toenails ingrown?              No Is there heavy callous build-up?              No Is there a claw toe deformity?                          No Is there elevated skin temperature?            No Is there limited ankle dorsiflexion?            No Is there foot or ankle muscle weakness?            No Do you have pain in calf while walking?           No      Pulse Check          Right Foot          Left Foot Posterior Tibial:        2+            2+ Dorsalis Pedis:        2+            2+  High Risk Feet? Yes   10-g (5.07) Semmes-Weinstein Monofilament Test Performed by: Hilda Blades Ditzler RN  Right Foot          Left Foot Visual Inspection     normal         normal Test Control      normal         normal Site 1         normal         normal Site 2         normal         normal Site 3         normal         normal Site 4         normal          normal Site 5         normal         normal Site 6         normal         normal Site 7         normal         normal Site 8         normal         normal Site 9         normal         normal Site 10         normal         normal  Impression      normal         normal

## 2010-05-27 ENCOUNTER — Telehealth: Payer: Self-pay | Admitting: *Deleted

## 2010-05-27 NOTE — Telephone Encounter (Signed)
Left message for patient to call to let us know the result of her ER visit and to schedule a follow up. Note patient next appointment is March 2,2012.

## 2010-05-30 ENCOUNTER — Ambulatory Visit (INDEPENDENT_AMBULATORY_CARE_PROVIDER_SITE_OTHER): Payer: Self-pay | Admitting: Internal Medicine

## 2010-05-30 ENCOUNTER — Encounter: Payer: Self-pay | Admitting: Internal Medicine

## 2010-05-30 ENCOUNTER — Other Ambulatory Visit: Payer: Self-pay | Admitting: Internal Medicine

## 2010-05-30 VITALS — BP 113/78 | HR 78 | Temp 97.3°F | Ht 64.0 in | Wt 178.7 lb

## 2010-05-30 DIAGNOSIS — E119 Type 2 diabetes mellitus without complications: Secondary | ICD-10-CM

## 2010-05-30 DIAGNOSIS — F329 Major depressive disorder, single episode, unspecified: Secondary | ICD-10-CM

## 2010-05-30 LAB — GLUCOSE, POCT (MANUAL RESULT ENTRY): POC Glucose: 267

## 2010-05-30 MED ORDER — GLUCOSE BLOOD VI STRP: ORAL_STRIP | Status: AC

## 2010-05-30 MED ORDER — FLUOXETINE HCL 20 MG PO CAPS: 20.0000 mg | ORAL_CAPSULE | Freq: Every day | ORAL | Status: DC

## 2010-05-30 MED ORDER — FREESTYLE SYSTEM KIT: 1.0000 | PACK | Status: DC | PRN

## 2010-05-30 MED ORDER — INSULIN NPH ISOPHANE & REGULAR (70-30) 100 UNIT/ML ~~LOC~~ SUSP: 10.0000 [IU] | Freq: Two times a day (BID) | SUBCUTANEOUS | Status: DC

## 2010-05-30 MED ORDER — BL LANCETS THIN MISC: 1.0000 | Freq: Two times a day (BID) | Status: DC

## 2010-05-30 MED ORDER — "INSULIN SYRINGE 31G X 5/16"" 0.5 ML MISC": 1.0000 | Freq: Two times a day (BID) | Status: DC

## 2010-05-30 NOTE — Assessment & Plan Note (Signed)
Patient noted that her  Mood is down since her close friend passed away last 07-29-2022. She also noted that she had troubles sleeping.  She reported that she  was not taking Ambien and Amitriptyline for some time since she ran out of the prescriptions. Patient was taking Prozac in the past but it is not clear why it was changed to Amitriptyline.  Today I will DC amitriptyline and start her on Prozac 20 mg once daily. I will follow up with the patient and see how her mood is since she was started on the new medications and also started on insulin. Evaluate at the next office visit her mood and considered to increase the dosage if needed. She was taking in the past( July 2010) Prozac 40 mg daily.

## 2010-05-30 NOTE — Assessment & Plan Note (Addendum)
Patient's diabetes is uncontrolled. The last Hgb A1c in December of 2011 was 9.3 on metformin thousand milligram twice a day. She was found to have blood sugars above 300 in the ED on February 2. I discussed with patient that at this point that she needs to start insulin therapy.  She noted that she does not want to take insulin for ever but understands that her blood sugars are uncontrolled at this point. Will start Humulin  70/30 10 units twice a day. She will come in 2 weeks for a follow up. At that time evaluate her blood sugar levels and consider to change her insulin dosage accordingly. I advised her to check her blood sugars 2 times a day before she takes the insulin. I noted that she needs to bring her glucose meter and all her medications with her. She was not sure which medication she was exactly taking except she knew that she was taking metformin. Consider to recheck Microalbumin/Crea ratio at the next visit. Dicussed with Barnabas Harries.

## 2010-05-30 NOTE — Progress Notes (Signed)
  Subjective:    Patient ID: [name], female    DOB: [birth year], 64 y.o.   MRN: [id number]  HPI this is a 65 year old female with past medical history significant for diabetes and hypertension who presents to the clinic for the ED visit follow up.   During the ED visit she was noticed to have blood sugars above 350s but her Bmet was within normal limits. She was advised  to follow up with the outpatient clinic to follow up for her blood sugars and possible changes in her medication regimen. She reports that she is taking her metformin every day but she informed me that she has some troubles to swallow the big pills. She was seen in the ED for back pain and was discharged with tramadol 50 mg as every 6 hours when necessary which is controlling her pain at this point. She mentioned that she lost a friend last Wednesday and her mood is depressed at this point. She also ran out off her amitriptyline for some time.    Review of Systems  Constitutional: Positive for fatigue.  Eyes: Negative for visual disturbance.  Respiratory: Negative for cough.   Cardiovascular: Negative for chest pain.  Gastrointestinal: Negative for abdominal distention.  Musculoskeletal: Positive for back pain.  Neurological: Negative for dizziness and weakness.  Psychiatric/Behavioral:       [depressed      Objective:   Physical Exam  Constitutional: She is oriented to person, place, and time. She appears well-developed and well-nourished.  HENT:  Head: Normocephalic and atraumatic.  Neck: Neck supple.  Cardiovascular: Normal rate, regular rhythm and normal heart sounds.   Pulmonary/Chest: Effort normal and breath sounds normal.  Abdominal: Soft.  Neurological: She is alert and oriented to person, place, and time. She has normal reflexes.  Psychiatric: Her speech is normal and behavior is normal. Judgment normal. Thought content is not delusional. Cognition and memory are normal. She exhibits a depressed mood.  She expresses no suicidal ideation. She expresses no suicidal plans and no homicidal plans.          Assessment & Plan:

## 2010-06-01 NOTE — Progress Notes (Addendum)
Summary: Shingles pain  Phone Note Call from Patient   Caller: Patient Call For: Epimenio Sarin MD Summary of Call: Call from pt said that she is having a bout of Shingles.  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  Pt is requesting a different pain stonger pain medication.  RTC to pt message left on answering machine to call the Clinics. Initial call taken by: Sander Nephew RN,  May 24, 2010 9:02 AM  Follow-up for Phone Call        I agree that patient should come in to clinic to be evaluated if she has developed new pain. Shingles pain had resolved when I saw her at last visit.  Follow-up by: Epimenio Sarin MD,  May 24, 2010 10:22 AM  Additional Follow-up for Phone Call Additional follow up Details #1::        needs eval when appt available.     Appended Document: Shingles pain RTC to pt no available appointments.  Pt has an appointment with Cristobal Goldmann on tomorrow.  Will check on an appointment at that time.  Pt refuses to go to Urgent Care or the ER. Sander Nephew, RN May 24, 2010 2:17 PM

## 2010-06-02 ENCOUNTER — Telehealth: Payer: Self-pay | Admitting: Dietician

## 2010-06-02 NOTE — Telephone Encounter (Signed)
Patient reports she is taking insulin and is feeling better. "It's coming down". She was able to return incorrect glucose test strips and buy a [facility] meter and strips instead. Patient asked if she could take her insulin doses at breakfast and lunch instead of 5 PM.  Encouraged her to take before her am and Pm meals which should be spread out over day for insulin to last 24 hours and to time it correctly with meals.  Recommend follow up of why patient wanted to take insulin earlier in day to be sure her diabetes training/schedule needs are being met. Patient has scheduled follow up March 2nd.

## 2010-06-06 NOTE — Telephone Encounter (Signed)
Thank you very much for informing. I will follow up at the next office visit.

## 2010-06-09 ENCOUNTER — Telehealth: Payer: Self-pay | Admitting: Dietician

## 2010-06-09 NOTE — Telephone Encounter (Signed)
Discussed blood sugars and trying to be as consistent as possible with timing and meal times and sizes. Feels much better but desires better blood sugars. Agrees to follow up in March. Sounds like she needs a bit more insulin since her lowest blood sugars are in 200s.    10 units twice a day Lowest 213 on another meter since starting insulin. Below are other blood sugars: 2/12   267, 442 2/13    481, ab-366, l-297 d- 347 2/14   294, 375 2/15-   448,488 2/16-   283, - 322 al, 303 Proposed schedule per patient:  630 am- insulin and blood sugar 7 am - breakfast 11- 12-lunch- likes fruit 5- 515  pm blood sugar and insulin 545- supper.  Mailing information about importance of keeping carbs consistent and what they are per patient request to begin meal planning education. Will route this note to PCP to see if she'd like patient to adjust insulin doses (1-2 units at a time) prior to her return to clinic.

## 2010-06-19 ENCOUNTER — Encounter: Payer: Self-pay | Admitting: Internal Medicine

## 2010-06-24 ENCOUNTER — Ambulatory Visit: Payer: Self-pay | Admitting: Internal Medicine

## 2010-06-24 ENCOUNTER — Encounter: Payer: Self-pay | Admitting: Internal Medicine

## 2010-06-24 ENCOUNTER — Ambulatory Visit (INDEPENDENT_AMBULATORY_CARE_PROVIDER_SITE_OTHER): Payer: Self-pay | Admitting: Internal Medicine

## 2010-06-24 DIAGNOSIS — F32A Depression, unspecified: Secondary | ICD-10-CM

## 2010-06-24 DIAGNOSIS — IMO0002 Reserved for concepts with insufficient information to code with codable children: Secondary | ICD-10-CM

## 2010-06-24 DIAGNOSIS — B029 Zoster without complications: Secondary | ICD-10-CM

## 2010-06-24 DIAGNOSIS — F329 Major depressive disorder, single episode, unspecified: Secondary | ICD-10-CM

## 2010-06-24 DIAGNOSIS — M792 Neuralgia and neuritis, unspecified: Secondary | ICD-10-CM

## 2010-06-24 DIAGNOSIS — F3289 Other specified depressive episodes: Secondary | ICD-10-CM

## 2010-06-24 DIAGNOSIS — E119 Type 2 diabetes mellitus without complications: Secondary | ICD-10-CM

## 2010-06-24 MED ORDER — METFORMIN HCL ER 500 MG PO TB24: 1000.0000 mg | ORAL_TABLET | Freq: Every day | ORAL | Status: DC

## 2010-06-24 MED ORDER — GABAPENTIN 300 MG PO CAPS: 300.0000 mg | ORAL_CAPSULE | Freq: Three times a day (TID) | ORAL | Status: DC

## 2010-06-24 MED ORDER — FLUOXETINE HCL 20 MG PO CAPS: 40.0000 mg | ORAL_CAPSULE | Freq: Every day | ORAL | Status: DC

## 2010-06-24 NOTE — Assessment & Plan Note (Addendum)
I am very concerned about patient's ongoing depressed mood and thoughts that she might not want to live anymore.  She denies any suicidal plans or intentions.  She agrees to seek immediate medical and attention if she ever develops such thoughts.  I will increase her dose of Prozac to 40 mg daily. I have asked her to return to clinic in the next few weeks so that her depressed mood can continue to be followed closely.

## 2010-06-24 NOTE — Progress Notes (Signed)
Subjective:    Patient ID: [name], female    DOB: 10/11/1945, 65 y.o.   MRN: [id number]  HPI  [name] is a 65 year old woman  With a past medical history of type 2 diabetes hypertension hyperlipidemia depression and herpes zoster. She presented today for evaluation/followup of: 1. Post-herpetic pain.  When I saw her in January she thought that her herpes zoster pain had resolved completely. However, she now reports some residual diffuse right-sided pain. She says that this pain is worse when she is exerting herself, such as lifting, or using her right side heavily.  She has been taking gabapentin 300 mg daily but ran out a few days ago. She had previously also been tried on tramadol and Vicodin for control of her neuropathic pain; however, gabapentin has worked the best 2.  Diabetes mellitus type 2. Patient's diabetes was previously well controlled on metformin 1000 mg twice daily. However, late last year glycemic control deteriorated as patient was taking metformin less consistently. Last hemoglobin A1c was 9.3 in December 2011. Patient says that she stopped taking her metformin because the pills were too large to swallow and she felt that they stuck in her throat and made her gag.  She says that she is has had this problem with the large metformin pills but it has just started bothering her more recently. She denies any pain or difficulty swallowing anything else. On May 26, 2010 she presented to the emergency department.  Serum glucose at that time was found to be greater than 400. She followed up in clinic and was started on 70/30 insulin 10 units twice daily.  Since starting insulin her CBGs have continued to be in the 200-300s.  3.  Depression.  Patient continues to describe depressed mood since losing her friend in early February.  She was recently started on Prozac 20 mg daily in early February.  Years ago she had been treated with Prozac 40 mg daily. She denies suicidal plans but  says that she has occasionally thought that she might be better off dead.  These thoughts are short lived and she knows that they are not rational.  In general she feels that her mood is better since starting Prozac  Review of Systems  Constitutional: Negative for fever, chills, appetite change and fatigue.  HENT: Negative for congestion, sore throat, trouble swallowing and postnasal drip.   Respiratory: Negative for shortness of breath.   Cardiovascular: Negative for chest pain.  Gastrointestinal: Negative for nausea, vomiting, abdominal pain, diarrhea and constipation.  Genitourinary: Negative for difficulty urinating.  Musculoskeletal: Positive for back pain.  Neurological: Negative for weakness and numbness.  Psychiatric/Behavioral: Positive for dysphoric mood. Negative for suicidal ideas, hallucinations and self-injury. The patient is not nervous/anxious.        Objective:   Physical Exam  Constitutional: She is oriented to person, place, and time. She appears well-developed. No distress.  HENT:  Head: Normocephalic and atraumatic.  Mouth/Throat: Oropharynx is clear and moist.  Eyes: Conjunctivae and EOM are normal. Pupils are equal, round, and reactive to light. No scleral icterus.  Neck: Normal range of motion. Neck supple. No tracheal deviation present. No thyromegaly present.  Cardiovascular: Normal rate, regular rhythm, normal heart sounds and intact distal pulses.  Exam reveals no gallop and no friction rub.   No murmur heard. Pulmonary/Chest: Breath sounds normal. No respiratory distress. She has no wheezes. She has no rales. She exhibits no tenderness.  Abdominal: Soft. Bowel sounds are normal. She exhibits  no distension and no mass. There is no tenderness. There is no rebound and no guarding.  Musculoskeletal: Normal range of motion. She exhibits no edema and no tenderness.  Neurological: She is alert and oriented to person, place, and time. No cranial nerve deficit.  Skin:  Skin is warm and dry. No rash noted. No erythema.  Psychiatric: She has a normal mood and affect. Her behavior is normal.          Assessment & Plan:

## 2010-06-24 NOTE — Patient Instructions (Signed)
Please record your blood sugars twice daily and bring to your next appointment.

## 2010-06-24 NOTE — Assessment & Plan Note (Signed)
Patient continues to have what seems to be mild neuropathic pain following episode of shingles in November 2011.  Will refill gabapentin. Patient advised that she may titrate up to 3 times daily if necessary to control pain symptoms.

## 2010-06-24 NOTE — Assessment & Plan Note (Signed)
Patient's diabetes previously well controlled on metformin 1000mg  BID, which she stopped due to difficulty swallowing the large pills. She just started 70/30 insulin 10units twice daily a month ago but CBGs are still quite elevated in 2-300s. Spoke with Barnabas Harries who suggested we try metformin 500mg  XR tabs (which are smaller and coated; therefore, may be easier to swallow). Will try 2 tablets of 500mg  Metformin XR daily in addition to current dose of 70/30 insulin. If she does well on this, may increase to even 4 tablets daily. Patient's meter could not be downloaded -- I have asked her to record blood glucose that we can discuss her control on this regimen at her next visit.

## 2010-07-04 ENCOUNTER — Telehealth: Payer: Self-pay | Admitting: Dietician

## 2010-07-04 NOTE — Telephone Encounter (Signed)
Called to follow up with her diabetes, she says it has been down considerably: with taking 10 units 70/30 insulin twice and 2 tablets metformin every day with breakfast, has been taking her blood sugar twice at breakfast and dinner with two different meters. Encouraged her to stop trying to compare meters ( this is not the recommended way to compare) and to use those extra strips to check her blood sugar before bedtime a few time a week.   3/7-     229/192                           178/151 3/8      184/160                            125/96 3/9      185/181                            156/144  3/10   213/         Lunch 156        148/153 3/11    226/141                            106/107 3/12    185/130   Her goal is for most of her blood sugars to be 106 - 130 and she feels like she is getting there. Attributes her improving blood sugars to the metformin and better schedule/timing with insulin and meals. Support provided.

## 2010-07-05 LAB — GLUCOSE, CAPILLARY
Glucose-Capillary: 181 mg/dL — ABNORMAL HIGH (ref 70–99)
Glucose-Capillary: 310 mg/dL — ABNORMAL HIGH (ref 70–99)

## 2010-07-11 LAB — GLUCOSE, CAPILLARY: Glucose-Capillary: 92 mg/dL (ref 70–99)

## 2010-07-13 LAB — GLUCOSE, CAPILLARY: Glucose-Capillary: 146 mg/dL — ABNORMAL HIGH (ref 70–99)

## 2010-07-22 ENCOUNTER — Ambulatory Visit (INDEPENDENT_AMBULATORY_CARE_PROVIDER_SITE_OTHER): Payer: Self-pay | Admitting: Internal Medicine

## 2010-07-22 DIAGNOSIS — I10 Essential (primary) hypertension: Secondary | ICD-10-CM

## 2010-07-22 DIAGNOSIS — B029 Zoster without complications: Secondary | ICD-10-CM

## 2010-07-22 DIAGNOSIS — F329 Major depressive disorder, single episode, unspecified: Secondary | ICD-10-CM

## 2010-07-22 DIAGNOSIS — R1310 Dysphagia, unspecified: Secondary | ICD-10-CM | POA: Insufficient documentation

## 2010-07-22 DIAGNOSIS — E119 Type 2 diabetes mellitus without complications: Secondary | ICD-10-CM

## 2010-07-22 DIAGNOSIS — G4700 Insomnia, unspecified: Secondary | ICD-10-CM

## 2010-07-22 MED ORDER — ZOLPIDEM TARTRATE 5 MG PO TABS: 5.0000 mg | ORAL_TABLET | Freq: Every evening | ORAL | Status: DC | PRN

## 2010-07-22 MED ORDER — HYDROCHLOROTHIAZIDE 25 MG PO TABS: 25.0000 mg | ORAL_TABLET | Freq: Every day | ORAL | Status: DC

## 2010-07-22 MED ORDER — LISINOPRIL 40 MG PO TABS: 40.0000 mg | ORAL_TABLET | Freq: Every day | ORAL | Status: DC

## 2010-07-22 NOTE — Patient Instructions (Signed)
Take metformin twice daily and check your blood sugars regularly. Please record your blood sugars and bring them in to your next clinic visit.  Take Prozac 40mg  (2 tablets) at the same time every day. This medication only works well when taken every day.

## 2010-07-22 NOTE — Progress Notes (Signed)
Subjective:    Patient ID: [name], female    DOB: 04-28-1945, 65 y.o.   MRN: [id number]  HPI [name] is a 65yo W who presents for follow-up of: 1. Depression. She states that her mood has been a little better. She still has a lot of stress and anxeity related to caring for neighbor who is on hemodialysis. Prozac increased to 40mg  daily (two 20mg  tablets) last time but she's been taking only 1-2 pills if she feels she needs it (she might take one pill early in the day and sometimes a second pill later in the day if she's feeling stressed). She denies suicidal thoughts or ideation. She also denies any feelings of panic or doom.  2. DM2. Still using 70/30 insulin, 10units twice daily. Had previously stopped metformin b/c of difficulty swallowing the large pills. Tried the 500mg  XR tablets at last visit in the hope that the smaller pill size and coating would be easier to swallow. She was not convinced that the coating helped at all -- she still had to break these pills in half. She ultimately went back to her 1000mg  tablets, which she has been breaking into 1/4 pieces and taking 1000mg  each morning. She has been recording CBGs a couple of times daily, which are improved (most readings in 100s, some in 200s, no lows). She denies symptoms of hyper or hypoglycemia.  3. Dysphagia. Still complaining of difficulty swallowing large pills. However, she is now also describing difficulty swallowing food. She says that any type of food can "get stuck" in her throat if she doesn't chew well. She sometimes vomits after eating because of this. She has not had any weight loss but she does continue to smoke. She states that this dysphagia has been going on for several months and seems to be getting worse. 4. Insomnia. Reports difficulty falling asleep and staying asleep (often wakes up in the middle of the night and can't get back to sleep). Ran out of Ambien -- would like refill.   Current Outpatient  Prescriptions on File Prior to Visit  Medication Sig Dispense Refill  . aspirin EC 81 MG EC tablet Take 81 mg by mouth daily.        . BL Lancets Thin MISC 1 each by Does not apply route 2 (two) times daily before a meal.  100 each  1  . FLUoxetine (PROZAC) 20 MG capsule Take 2 capsules (40 mg total) by mouth daily.  30 capsule  2  . gabapentin (NEURONTIN) 300 MG capsule Take 1 capsule (300 mg total) by mouth 3 (three) times daily.  90 capsule  1  . glucose blood test strip Use as instructed  100 each  3  . hydrochlorothiazide 25 MG tablet Take 1 tablet (25 mg total) by mouth daily.  30 tablet  5  . insulin NPH-insulin regular (RELION 70/30) (70-30) 100 UNIT/ML injection Inject 10 Units into the skin 2 (two) times daily before a meal.  10 mL  3  . Insulin Syringe-Needle U-100 (INSULIN SYRINGE .5CC/31GX5/16") 31G X 5/16" 0.5 ML MISC 1 each by Does not apply route 2 (two) times daily before a meal.  100 each  3  . lisinopril (PRINIVIL,ZESTRIL) 40 MG tablet Take 1 tablet (40 mg total) by mouth daily.  30 tablet  5  . loratadine (CLARITIN) 10 MG tablet Take 10 mg by mouth daily.        . pravastatin (PRAVACHOL) 40 MG tablet Take 40 mg by mouth  daily.        Marland Kitchen DISCONTD: metFORMIN (GLUCOPHAGE-XR) 500 MG 24 hr tablet Take 2 tablets (1,000 mg total) by mouth daily with breakfast.  60 tablet  11  . fluocinonide (LIDEX) 0.05 % cream Apply topically 2 (two) times daily. Apply a thin layer 2-4 times a day       . hydrOXYzine (ATARAX) 25 MG tablet Take 25 mg by mouth every 6 (six) hours as needed. For itching       . traMADol (ULTRAM) 50 MG tablet Take 50 mg by mouth every 6 (six) hours as needed. Up to 4 times a day for pain         Past Medical History  Diagnosis Date  . Diabetes mellitus   . Asthma     childhood  . Depression   . Anxiety   . Hypertension   . Hyperlipidemia   . 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       Review of Systems  Constitutional: Negative for fever and chills.  HENT: Positive for trouble swallowing. Negative for congestion, sore throat, rhinorrhea and voice change.   Eyes: Negative for visual disturbance.  Respiratory: Negative for cough, chest tightness, shortness of breath and wheezing.   Cardiovascular: Negative for chest pain and palpitations.  Gastrointestinal: Negative for nausea, vomiting, abdominal pain, diarrhea, constipation and blood in stool.  Genitourinary: Negative for difficulty urinating.  Musculoskeletal: Negative for joint swelling and gait problem.  Skin: Negative for rash.  Neurological: Negative for dizziness, syncope, speech difficulty, weakness, light-headedness and headaches.  Psychiatric/Behavioral: Positive for sleep disturbance and dysphoric mood. Negative for suicidal ideas, hallucinations, self-injury and agitation. The patient is nervous/anxious.        Objective:   Physical Exam  Constitutional: She is oriented to person, place, and time. She appears well-developed and well-nourished. No distress.  HENT:  Head: Normocephalic and atraumatic.  Mouth/Throat: Oropharynx is clear and moist. No oropharyngeal exudate.  Eyes: Conjunctivae and EOM are normal. Pupils are equal, round, and reactive to light. No scleral icterus.  Neck: Normal range of motion. Neck supple. No JVD present. No tracheal deviation present. No thyromegaly present.  Cardiovascular: Normal rate, regular rhythm, normal heart sounds and intact distal pulses.  Exam reveals no gallop and no friction rub.   No murmur heard. Pulmonary/Chest: Effort normal and breath sounds normal. No stridor. No respiratory distress. She has no wheezes. She has no rales. She exhibits no tenderness.  Abdominal: Soft. Bowel sounds are normal. She exhibits no distension and no mass. There is no tenderness. There is no rebound and no guarding.  Musculoskeletal: Normal range of motion. She exhibits no  edema and no tenderness.  Lymphadenopathy:    She has no cervical adenopathy.  Neurological: She is alert and oriented to person, place, and time. No cranial nerve deficit. Coordination normal.  Skin: Skin is warm and dry. No rash noted. She is not diaphoretic. No erythema.  Psychiatric: Judgment and thought content normal.       Appears slightly anxious, ringing hands when talking about recent emotional stress, breaks eye contact when doing so          Assessment & Plan:

## 2010-07-25 LAB — GLUCOSE, CAPILLARY: Glucose-Capillary: 110 mg/dL — ABNORMAL HIGH (ref 70–99)

## 2010-07-26 NOTE — Assessment & Plan Note (Signed)
Although [name] states that her mood has improved in the past few weeks, she still appears anxious and describes a significant amount of emotional stress related to caring for an elderly dialysis patient who lives in her building. She denies any suicidal thoughts or intentions. Prozac was increased at last visit to 40mg  daily, which she had reportedly done well on in the past. However, she describes taking this medication recently only as needed for stress/anxiety (she will take one pill and perhaps a second later in the day depending on how she feels). I emphasized the importance of taking this medication consistently in order to maximize its effectiveness. I also explained how it make take weeks after making a change in this medicine before she notices a difference. She seems to be describing a more significant anxiety component at this visit. She denied any symptoms of panic attack. Once she is stable on the increased dose of Prozac, I may consider whether should would benefit from an anxiolytic that she could take as needed. Will continue to follow her closely in clinic, approximately once per month.

## 2010-07-26 NOTE — Assessment & Plan Note (Signed)
She continues to describe occasional R-sided pain that seems consistent with post-herpetic neuralgia. This is currently well controlled with gabapentin.

## 2010-07-26 NOTE — Assessment & Plan Note (Signed)
I am becoming increasingly concerned about [name]'s reported difficulty swallowing. At the last visit, she explained that she had stopped metformin because of difficulty swallowing the large pills and feeling that they got stuck. However, she did not describe significant difficulty swallowing food at that time. On this visit, however, she reports that any type of food can "get stuck" if she doesn't chew well and she has occasionally vomited shortly after eating, which she attributes to food "getting stuck" near the top of her throat. It seems that these symptoms have been progressive over the past several months. Although this dysphagia could be related to worsening depression, I am worried about the possibility of mechanical obstruction, mass, or stricture. Will send her for a barium swallow evaluation of esophagus. Depending on the outcome of that study, may consider referral to gastroenterology.

## 2010-07-27 ENCOUNTER — Encounter: Payer: Self-pay | Admitting: Internal Medicine

## 2010-07-27 LAB — GLUCOSE, CAPILLARY: Glucose-Capillary: 128 mg/dL — ABNORMAL HIGH (ref 70–99)

## 2010-07-27 MED ORDER — METFORMIN HCL 500 MG PO TABS: 500.0000 mg | ORAL_TABLET | Freq: Two times a day (BID) | ORAL | Status: DC

## 2010-07-27 NOTE — Assessment & Plan Note (Signed)
HbA1C still significantly above goal at 9.1 today. However, recorded CBGs seem improved since restarting metformin last month in addition to 70/30 insulin. CBGs mostly in the 100s with some readings in the 200s, no lows. However, patient states that switching to the metformin ER tablets did not help with swallowing. Instead, she continued using her old 1000mg  tablets but split them in to 1/4 pieces and took all 4 of these 1/4 pieces each morning. Since she is not using the ER formulation, I recommended that she split the dose to BID dosing (500mg  BID). This may also help with late day high CBGs. She is encouraged to continue checking and recording blood glucose. Depending on glycemic control at next visit, could increase metformin to 1000mg  BID.

## 2010-07-27 NOTE — Assessment & Plan Note (Signed)
She reports running out of Ambien and having more difficulty falling asleep and staying asleep (she frequently awakes at 2 or 3 am and can't get back to sleep). I believe much of this sleeping difficulty may be related to depression. Will refill Ambien and continue working on treatment of depression as discussed.

## 2010-07-28 LAB — GLUCOSE, CAPILLARY: Glucose-Capillary: 101 mg/dL — ABNORMAL HIGH (ref 70–99)

## 2010-07-31 LAB — GLUCOSE, CAPILLARY: Glucose-Capillary: 134 mg/dL — ABNORMAL HIGH (ref 70–99)

## 2010-08-01 LAB — GLUCOSE, CAPILLARY: Glucose-Capillary: 112 mg/dL — ABNORMAL HIGH (ref 70–99)

## 2010-08-02 ENCOUNTER — Ambulatory Visit (HOSPITAL_COMMUNITY)
Admission: RE | Admit: 2010-08-02 | Discharge: 2010-08-02 | Disposition: A | Discharge status: Home / Self Care | Payer: Self-pay | Source: Ambulatory Visit | Attending: Internal Medicine | Admitting: Internal Medicine

## 2010-08-02 DIAGNOSIS — K219 Gastro-esophageal reflux disease without esophagitis: Secondary | ICD-10-CM | POA: Insufficient documentation

## 2010-08-02 DIAGNOSIS — K224 Dyskinesia of esophagus: Secondary | ICD-10-CM | POA: Insufficient documentation

## 2010-08-02 DIAGNOSIS — K449 Diaphragmatic hernia without obstruction or gangrene: Secondary | ICD-10-CM | POA: Insufficient documentation

## 2010-08-02 DIAGNOSIS — R1310 Dysphagia, unspecified: Secondary | ICD-10-CM | POA: Insufficient documentation

## 2010-08-03 ENCOUNTER — Telehealth: Payer: Self-pay | Admitting: Internal Medicine

## 2010-08-03 NOTE — Telephone Encounter (Signed)
Called Ms. Orf to inform her of results of barium swallow. It seems that she does have some esophageal dysmotility as well as a possible narrowing/stricture that did not allow the barium tablet from passing. Recommend referral to GI physician and she agreed.

## 2010-08-04 LAB — GLUCOSE, CAPILLARY: Glucose-Capillary: 79 mg/dL (ref 70–99)

## 2010-08-09 LAB — GLUCOSE, CAPILLARY: Glucose-Capillary: 79 mg/dL (ref 70–99)

## 2010-08-19 ENCOUNTER — Ambulatory Visit (INDEPENDENT_AMBULATORY_CARE_PROVIDER_SITE_OTHER): Payer: Self-pay | Admitting: Internal Medicine

## 2010-08-19 VITALS — BP 113/80 | HR 80 | Temp 97.8°F | Ht 64.0 in | Wt 193.9 lb

## 2010-08-19 DIAGNOSIS — B029 Zoster without complications: Secondary | ICD-10-CM

## 2010-08-19 DIAGNOSIS — R1310 Dysphagia, unspecified: Secondary | ICD-10-CM

## 2010-08-19 DIAGNOSIS — I10 Essential (primary) hypertension: Secondary | ICD-10-CM

## 2010-08-19 DIAGNOSIS — E119 Type 2 diabetes mellitus without complications: Secondary | ICD-10-CM

## 2010-08-19 DIAGNOSIS — F329 Major depressive disorder, single episode, unspecified: Secondary | ICD-10-CM

## 2010-08-19 LAB — POCT GLYCOSYLATED HEMOGLOBIN (HGB A1C): Hemoglobin A1C: 7.6

## 2010-08-19 MED ORDER — PRAVASTATIN SODIUM 40 MG PO TABS: 40.0000 mg | ORAL_TABLET | Freq: Every day | ORAL | Status: DC

## 2010-08-23 ENCOUNTER — Telehealth: Payer: Self-pay | Admitting: Dietician

## 2010-08-26 ENCOUNTER — Encounter: Payer: Self-pay | Admitting: Internal Medicine

## 2010-08-26 MED ORDER — METFORMIN HCL 1000 MG PO TABS: 1000.0000 mg | ORAL_TABLET | Freq: Two times a day (BID) | ORAL | Status: DC

## 2010-08-26 NOTE — Assessment & Plan Note (Signed)
Continues to have difficulty swallowing with sensation that food and pills "get stuck" in her throat. Barium swallow demonstrated focal mild narrowing of the proximal esophagus, through which a 13 mm barium pill would not pass, esophageal dysmotility, and moderate hiatal hernia with probable gastroesophageal reflux disease. Will continue to work on getting her in to see a gastroenterologist for further work-up including possible EGD.

## 2010-08-26 NOTE — Assessment & Plan Note (Signed)
Post-herpetic neuralgia controlled adequately with gabapentin.

## 2010-08-26 NOTE — Assessment & Plan Note (Signed)
HbA1C improved to 7.6 today. She demonstrates much tighter control (CBGs mostly in 100s) since restarting metformin 1000mg  BID and continuing 70/30 insulin that she started in Feb. She seems to be doing ok breaking her old 1000mg  tablets in half. No low blood sugars. Continue current regimen and she will continue to record CBGs regularly. If next A1C is still elevated above goal, consider titrating up insulin.

## 2010-08-26 NOTE — Progress Notes (Signed)
Subjective:    Patient ID: [name], female    DOB: 1945-12-26, 65 y.o.   MRN: [id number]  Back Pain Pertinent negatives include no abdominal pain, chest pain, fever, numbness or weakness.   65yo W who presents for follow-up of: 1. Dysphagia. Continues to have difficulty swallowing food and pills; however, she believes it may be somewhat improved. Barium swallow demonstrated focal mild narrowing of the proximal esophagus, through which a 65mm barium pill would not pass. Esophageal dysmotility and hiatal hernia suggestive of GERD also seen. She continues to smoke but not heavily (approximately 1pack/wk). She has no history of goiter or other compressive neck mass. We are currently trying to get her scheduled to see a gastroenterologist for further work-up and possible EGD. 2. DM2. Blood glucose has been improved since she started breaking her 1000mg  metformin tablets in half and taking 2-halves (1 pill) twice daily. Denies any episodes of low blood glucose. Reported CBGs mostly in the 100s with a few in the 200s (typically after a high sugar dessert).  3. Depression. He reports improved mood and decreased stress recently. She is still helping to care for a neighbor on dialysis, which causes a great deal of stress. However, she says that she's doing a better job of not letting it bother her too much. She still reports occasional depressed mood and anxiousness but these are somewhat less frequent. No suicidal ideation. Still has some difficulty sleeping but responds well to Ambien.  4. Post-herpetic neuralgia. Continues to use gabapentin, which she says is adequately controlling her R sided pain.   Current Outpatient Prescriptions on File Prior to Visit  Medication Sig Dispense Refill  . aspirin EC 81 MG EC tablet Take 81 mg by mouth daily.        . BL Lancets Thin MISC 1 each by Does not apply route 2 (two) times daily before a meal.  100 each  1  . fluocinonide (LIDEX) 0.05 % cream Apply  topically 2 (two) times daily. Apply a thin layer 2-4 times a day       . FLUoxetine (PROZAC) 20 MG capsule Take 2 capsules (40 mg total) by mouth daily.  30 capsule  2  . gabapentin (NEURONTIN) 300 MG capsule Take 1 capsule (300 mg total) by mouth 3 (three) times daily.  90 capsule  1  . glucose blood test strip Use as instructed  100 each  3  . hydrochlorothiazide 25 MG tablet Take 1 tablet (25 mg total) by mouth daily.  30 tablet  5  . insulin NPH-insulin regular (RELION 70/30) (70-30) 100 UNIT/ML injection Inject 10 Units into the skin 2 (two) times daily before a meal.  10 mL  3  . Insulin Syringe-Needle U-100 (INSULIN SYRINGE .5CC/31GX5/16") 31G X 5/16" 0.5 ML MISC 1 each by Does not apply route 2 (two) times daily before a meal.  100 each  3  . lisinopril (PRINIVIL,ZESTRIL) 40 MG tablet Take 1 tablet (40 mg total) by mouth daily.  30 tablet  5  . loratadine (CLARITIN) 10 MG tablet Take 10 mg by mouth daily.        . metFORMIN (GLUCOPHAGE) 500 MG tablet Take 1 tablet (500 mg total) by mouth 2 (two) times daily with a meal. Break pills in half for easier swallowing.  60 tablet  11  . pravastatin (PRAVACHOL) 40 MG tablet Take 1 tablet (40 mg total) by mouth daily.  30 tablet  5  . traMADol (ULTRAM) 50 MG tablet  Take 50 mg by mouth every 6 (six) hours as needed. Up to 4 times a day for pain       . zolpidem (AMBIEN) 5 MG tablet Take 1 tablet (5 mg total) by mouth at bedtime as needed.  30 tablet  1  . hydrOXYzine (ATARAX) 25 MG tablet Take 25 mg by mouth every 6 (six) hours as needed. For itching        Past Medical History  Diagnosis Date  . Diabetes mellitus   . Asthma     childhood  . Depression   . Anxiety   . Hypertension   . Hyperlipidemia   . 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     Review of Systems  Constitutional: Negative for fever, chills, appetite change and fatigue.  HENT: Positive for trouble  swallowing. Negative for congestion, sore throat, neck pain, neck stiffness and voice change.   Eyes: Negative for visual disturbance.  Respiratory: Negative for cough, shortness of breath and wheezing.   Cardiovascular: Negative for chest pain and palpitations.  Gastrointestinal: Negative for nausea, vomiting, abdominal pain, diarrhea and constipation.  Genitourinary: Negative for difficulty urinating.  Musculoskeletal: Positive for back pain.  Skin: Negative for rash.  Neurological: Negative for dizziness, weakness and numbness.  Psychiatric/Behavioral: Positive for sleep disturbance and dysphoric mood. Negative for suicidal ideas, hallucinations, self-injury and agitation. The patient is nervous/anxious.        Objective:   Physical Exam  Constitutional: She is oriented to person, place, and time. No distress.  HENT:  Head: Normocephalic and atraumatic.  Mouth/Throat: Oropharynx is clear and moist. No oropharyngeal exudate.  Eyes: EOM are normal. Pupils are equal, round, and reactive to light. No scleral icterus.  Neck: Normal range of motion. Neck supple. No JVD present. No tracheal deviation present. No thyromegaly present.  Cardiovascular: Normal rate, regular rhythm, normal heart sounds and intact distal pulses.  Exam reveals no gallop and no friction rub.   No murmur heard. Pulmonary/Chest: Effort normal and breath sounds normal. No stridor. No respiratory distress. She has no wheezes. She has no rales. She exhibits no tenderness.  Abdominal: Soft. Bowel sounds are normal. She exhibits no distension and no mass. There is no tenderness. There is no rebound and no guarding.  Musculoskeletal: Normal range of motion. She exhibits no edema and no tenderness.  Lymphadenopathy:    She has no cervical adenopathy.  Neurological: She is alert and oriented to person, place, and time. No cranial nerve deficit. Coordination normal.  Skin: Skin is warm and dry. No rash noted. She is not  diaphoretic. No erythema.  Psychiatric: She has a normal mood and affect. Her behavior is normal. Judgment and thought content normal.          Assessment & Plan:

## 2010-08-26 NOTE — Assessment & Plan Note (Signed)
Improved. She still has some depressed mood and anxiety as well as sleep difficulty; however, her affect appears significantly improved today, she does not appear anxious as she often has in the past, and she reports being better able to deal with her daily life stress. Continue current dose of Prozac.

## 2010-08-26 NOTE — Assessment & Plan Note (Signed)
Well controlled  - Continue current management

## 2010-09-02 NOTE — Telephone Encounter (Signed)
Spoke with patient and answered her questions.

## 2010-09-06 NOTE — Discharge Summary (Signed)
[name], [name]               ACCOUNT NO.:  [id number]   MEDICAL RECORD NO.:  [id number]          PATIENT TYPE:  INP   LOCATION:  5014                         FACILITY:  [facility]   PHYSICIAN:  Evette Doffing, M.D.  DATE OF BIRTH:  April 15, 1946   DATE OF ADMISSION:  08/30/2007  DATE OF DISCHARGE:                               DISCHARGE SUMMARY   DISCHARGE DIAGNOSES:  1. Left thigh abscess, cultures positive for MRSA.  2. Diabetes, new diagnosis  3. Acute renal failure, prerenal, resolved.   DISCHARGE MEDICATIONS:  1. Metformin 500 mg p.o. b.i.d.  2. Lantus 15 units subcutaneous at bedtime.  3. Doxycycline 100 mg, 2 times a day for 14 days.   PROCEDURES PERFORMED:  1. CT scan of the lower extremity on Aug 30, 2007: Extensive cellulitis      of the left thigh.  No abscess is identified.  2. Repeat CT scan of the lower extremity on Sep 03, 2007: significant      worsening of subcutaneous edema and cellulitis with mild edema in      the deep fascial plane suggesting early fasciitis.  No discrete      abscess.   CONSULTANT:  [facility].   BRIEF ADMITTING HISTORY AND PHYSICAL:  [name] is a 65 year old woman  with no significant past medical history who was in her usual state of  health until 5 days prior to admission when she noticed a bump on her  left inner thigh that started while she was gardening.  The area was red  and itching and it progressively got bigger and worse .  She thought it  was an insect bite. She started feeling bad 2 days prior to admission  with dizziness and pain.  She was seen by a doctor who gave her Bactrim.  She started taking it  but she woke up in the middle of night with  itching and hives on her hand and chest so she stopped taking it. She  presented to the ED with these complaints.   PHYSICAL EXAMINATION:  VITAL SIGNS:  temperature 98.7, blood pressure  104/74, pulse 109, and respiration was 20.  She was saturating 97% on  room  air.  GENERAL:  No acute distress, sleepy with a slurred speech.  No swelling.  THROAT:  No erythema,  moist mucous membrane.  NECK:  Supple with some hives.  RESPIRATION:  Clear to auscultation with good air movement.  CARDIOVASCULAR:  Regular rate and rhythm with a prominent heart sound,  excellent femoral pulses.  GI:  Soft, nontender, and nondistended.  EXTREMITIES:  Left inner thigh with an area of erythema and induration  about 12 cm in diameters with an open area with no drainage.  SKIN:  She had some small macules on her extremities and her chest   On admission, her sodium was 125, potassium 3.8, chloride 94, bicarb 22,  BUN 25, creatinine 1.6, glucose of 453, white count was 23.4 with an ANC  of 20.4, hemoglobin of 13 with an MCV of 87.  CT that showed findings as  above.  HOSPITAL COURSE:  1. Left thigh cellulitis.  The patient was started on IV Vancomycin to      cover MRSA. Surgery was consulted but did not feel the patient      needed I&D at that time. However, after 3 days on the vancomycin      the affected area seemed to worsen and was draining purulent      material.  Repeat CT scan showed a question of early fasciitis.      Therefore, the patient was taken to surgery for I&D.  Cultures were      sent and grew MRSA sensitive to doxycycline. The patient improved      after debridement so she was changed to p.o. doxycycline for 14      more days. She will be followed by general surgery as out patient.  2. Diabetes.  The patient was not known to be a diabetic; on      admission, her sugars were 400.  She was started on Lantus 10 units      qhs. On the day prior to discharge, metformin was also added.      Patient will have followup for her diabetes with Dr. Marshell Levan at the      [facility] outpatient clinic.  3. Acute renal failure.  This was thought to be secondary to      dehydration.  The patient was given IV fluids and her creatinine      normalized.   VITALS DAY  OF DISCHARGE: temperature 97, pulse 84, respiration 18, blood  pressure 142/83.  She was saturating 99% on room air.   LABS DAY OF DISCHARGE:  CBC, white count was 7.8, hemoglobin of 10.0,  and platelets 481.  BMET, sodium 140, potassium 3.7, chloride 104,  bicarb 29, glucose 130, BUN 4, and creatinine 0.6.      Charlynne Cousins, M.D.  Electronically Signed      Evette Doffing, M.D.  Electronically Signed    AF/MEDQ  D:  09/06/2007  T:  09/07/2007  Job:  [id number]

## 2010-09-06 NOTE — Consult Note (Signed)
[name], [name]               ACCOUNT NO.:  [id number]   MEDICAL RECORD NO.:  [id number]          PATIENT TYPE:  INP   LOCATION:  5014                         FACILITY:  [facility]   PHYSICIAN:  Jonne Ply, MD   DATE OF BIRTH:  1945/08/13   DATE OF CONSULTATION:  09/02/2007  DATE OF DISCHARGE:                                 CONSULTATION   TIME OF CONSULTATION:  1400 p.m.   REFERRING PHYSICIAN:  Evette Doffing, MD with teaching service.   CONSULTING SURGEON:  Jonne Ply, MD.   REASON FOR CONSULTATION:  Left thigh cellulitis/abscess.   HISTORY OF PRESENT ILLNESS:  This is a 65 year old black female who is a  newly diagnosed diabetic as of this admission who presented to the  urgent clinic last week after being bitten by a bug.  This area where  she was bit began to get painful, erythematous, and swollen.  At that  time when she visited the urgent clinic, it was felt that she was  developing cellulitis and was put on Bactrim, which she later had an  allergic reaction to.  She was discontinued from this and her left thigh  cellulitis began to get worse.  At that time, it was felt that the  patient needed to be admitted to the hospital.  On admission, the  patient's white blood cell count was 23,400.  An initial very small 1-2  mm I&D was made by one of the resident in which a great deal of pus was  evacuated from her thigh.  She also had a CT scan done on date of  admission as well, which showed no abscess but thigh cellulitis.  She  was started on vancomycin and says that overall she began to start  feeling better.  However, this area apparently has continued to somewhat  enlarge.  The patient also states that even as of today when she stands  up, her leg pours pus.  Today her white blood cell count is 11,000.  Because of this area of cellulitis and questionable abscess, we are  consulted for possible surgical debridement.   REVIEW OF SYSTEMS:  See HPI.  Otherwise,  all other systems are negative.   FAMILY HISTORY:  Noncontributory.   PAST MEDICAL HISTORY:  New-onset diabetes as of this admission.   PAST SURGICAL HISTORY:  1. Status post hysterectomy.  2. Status post removal of a mass from the patient's back.  3. Status post right knee surgery.   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  She  smokes approximately 2 cigarettes a day, and an average of 2-3 days of  alcohol, approximately 2 drinks a day.  She also has a history of  cocaine abuse, she states many years ago.   ALLERGIES:  1. PENICILLIN.  2. SULFA.   MEDICATIONS:  The patient is currently on no home medications.  She was  recently on Bactrim for which she found that she was allergic to with  hives.   PHYSICAL EXAMINATION:  GENERAL:  This is a pleasant, well-developed, and  well-nourished 65 year old black female who is lying in bed in no acute  distress.  VITAL SIGNS:  Temperature 99.4, pulse 90, respirations 20, and blood  pressure 130/88.  EYES:  Sclerae noninjected.  Pupils are equal, round, and reactive to  light.  EARS, NOSE, MOUTH, AND THROAT:  Ears and nose no obvious masses or  lesions.  No rhinorrhea.  Mouth is pink and moist.  Ears shows no  exudate.  She does have poor dentition and is missing several of her  teeth.  NECK:  Supple.  Trachea is midline.  No thyromegaly.  HEART:  Regular rate and rhythm.  No murmurs, gallops or rubs.  Normal  S1 and S2.  +2 bilateral carotid and pedal pulses.  LUNGS:  Clear to auscultation bilaterally.  No wheezes, rhonchi, or  rales noted.  Respiratory effort is normal.  ABDOMEN:  Soft, nontender, and nondistended with active bowel sounds.  MUSCULOSKELETAL:  All 4 extremities are symmetrical with no cyanosis,  clubbing or edema.  SKIN:  Shows a left thigh cellulitis/abscess.  The area of cellulitis  measures approximately 30 x 20 cm with multiple areas of induration  along with some other areas of  fluctuance.  There is 1 central drainage  area that is approximately 1-2 mm in size that is currently draining  serous liquid, however, per the nurse the patient had earlier bandage  that was saturated with purulent drainage.  Otherwise, there are no  obvious masses, lesions, or rashes on the patient.  NEUROLOGIC:  Her cranial nerves II through XII appear to be grossly  intact.  Deep tendon reflex exam is deferred at this time.  PSYCH:  The patient is alert and oriented x3 with appropriate affect.   LABORATORY DATA:  White blood cell count today is 11,800, which has come  down from 23,400 on admission; hemoglobin is 1.2, hematocrit 33.2, and  platelets of 356,000.  Sodium 136, potassium 4.1, BUN 6, creatinine  0.64, glucose is 139.   DIAGNOSTICS:  CT scan of the lower extremity done on Aug 30, 2007, shows  extensive edema in the subcutaneous fat of the left anterior and medial  thigh.  No fluid collection or abscess is seen.  There is no edema  within the muscles of the thigh.  There is some skin thickening due to  cellulitis.   IMPRESSION:  1. Left thigh cellulitis/abscess.  2. New-onset diabetes mellitus.   PLAN:  At this time, the patient may need surgical debridement of this  area.  However, I will await Dr. Tresa Moore evaluation of this area to  determine if this is something that she can do at the bedside with the  patient awake or if this is something that needs to be done in the OR  under general anesthesia.  If this is something that needs to be  done in the OR, then Dr. Zettie Pho will make the patient n.p.o. after  midnight in preparation for this tomorrow.  Otherwise, if she does not  like this needs to be done in the OR, then this will be something that  she can hopefully debride at the bedside.      Deborah Cea, PA      Jonne Ply, MD  Electronically Signed    KEO/MEDQ  D:  09/02/2007  T:  09/03/2007  Job:  [id number]   cc:   Evette Doffing, M.D.

## 2010-09-06 NOTE — Op Note (Signed)
NAMEKONNOR, VANBERGEN               ACCOUNT NO.:  [id number]   MEDICAL RECORD NO.:  [id number]          PATIENT TYPE:  INP   LOCATION:  5014                         FACILITY:  [facility]   PHYSICIAN:  Merri Ray. Grandville Silos, M.D.DATE OF BIRTH:  [birth year]   DATE OF PROCEDURE:  09/03/2007  DATE OF DISCHARGE:                               OPERATIVE REPORT   PREOPERATIVE DIAGNOSIS:  Worsening cellulitis, left thigh with possible  fasciitis.   POSTOPERATIVE DIAGNOSES:  1. Worsening cellulitis, left thigh with possible fasciitis.  2. Loculated abscess, left thigh.  3. Soft tissues viable with no evidence of necrotizing infection.   PROCEDURE:  Incision and drainage of abscess, left thigh.   SURGEON:  Merri Ray. Grandville Silos, M.D.   ANESTHESIA:  General.   HISTORY OF PRESENT ILLNESS:  Ms. Hiester is a 65 year old African-  American female who was previously known as a diabetic, was admitted  with cellulitis of the left thigh.  It has progressively worsened over  the past 4 days.  She had followup CT scan earlier today showing a  worsening cellulitis of the left thigh with possible fasciitis noted, as  there were some edema in the fascial planes, no abscess was seen on her  CT scan; however, she had significant drainage of purulent material on  physical exam and is brought emergently to the operating room for  incision and drainage of left thigh.   PROCEDURE IN DETAIL:  Informed consent was obtained.  The patient is  receiving intravenous antibiotics.  Her site was marked after  identifying her in the preop holding area.  She was brought to the  operating room.  General endotracheal anesthesia was administered by the  anesthesia staff.  Her left thigh was prepped and draped in a sterile  fashion including the medial aspects.  A transverse oblique incision was  made primarily over the largest fluctuant area.  Subcutaneous tissues  were dissected down entering a large loculated abscess cavity.   Cultures  were sent to the laboratory.  Loculations were broken up and  approximately 250 mL of purulent material was evacuated.  The area  extended inferiorly on the medial portion and then extended some  laterally as well.  The abscess cavity was broken up of loculations and  then purulent material was evacuated.  We then used the Bovie cautery to  get good hemostasis.  The pulse irrigator was then used to pulse lavage  3 L of saline, which was suctioned down, significantly cleaned out the  wound.  Hemostasis was then again obtained with Bovie cautery.  The  fascia and subcutaneous tissues were all viable with no evidence of a  necrotizing infection.  Once hemostasis was ensured, the wound was  packed with  saline-soaked Kerlix and a bulky sterile dressing was applied.  Sponge,  needle, and instrument counts were all correct.  The patient tolerated  the procedure well without apparent complications and was taken to the  recovery room in stable condition.      Merri Ray Grandville Silos, M.D.  Electronically Signed     BET/MEDQ  D:  09/03/2007  T:  09/04/2007  Job:  [id number]

## 2010-09-29 ENCOUNTER — Other Ambulatory Visit (INDEPENDENT_AMBULATORY_CARE_PROVIDER_SITE_OTHER): Payer: Self-pay

## 2010-09-29 ENCOUNTER — Ambulatory Visit (INDEPENDENT_AMBULATORY_CARE_PROVIDER_SITE_OTHER): Payer: Self-pay | Admitting: Gastroenterology

## 2010-09-29 ENCOUNTER — Encounter: Payer: Self-pay | Admitting: Gastroenterology

## 2010-09-29 DIAGNOSIS — E1349 Other specified diabetes mellitus with other diabetic neurological complication: Secondary | ICD-10-CM

## 2010-09-29 DIAGNOSIS — E1142 Type 2 diabetes mellitus with diabetic polyneuropathy: Secondary | ICD-10-CM

## 2010-09-29 DIAGNOSIS — K219 Gastro-esophageal reflux disease without esophagitis: Secondary | ICD-10-CM | POA: Insufficient documentation

## 2010-09-29 DIAGNOSIS — R1310 Dysphagia, unspecified: Secondary | ICD-10-CM

## 2010-09-29 DIAGNOSIS — F329 Major depressive disorder, single episode, unspecified: Secondary | ICD-10-CM

## 2010-09-29 DIAGNOSIS — E1340 Other specified diabetes mellitus with diabetic neuropathy, unspecified: Secondary | ICD-10-CM | POA: Insufficient documentation

## 2010-09-29 DIAGNOSIS — D649 Anemia, unspecified: Secondary | ICD-10-CM | POA: Insufficient documentation

## 2010-09-29 DIAGNOSIS — E1149 Type 2 diabetes mellitus with other diabetic neurological complication: Secondary | ICD-10-CM | POA: Insufficient documentation

## 2010-09-29 DIAGNOSIS — E119 Type 2 diabetes mellitus without complications: Secondary | ICD-10-CM

## 2010-09-29 DIAGNOSIS — F32A Depression, unspecified: Secondary | ICD-10-CM | POA: Insufficient documentation

## 2010-09-29 LAB — IBC PANEL
Iron: 131 ug/dL (ref 42–145)
Saturation Ratios: 25.9 % (ref 20.0–50.0)
Transferrin: 361.7 mg/dL — ABNORMAL HIGH (ref 212.0–360.0)

## 2010-09-29 LAB — CBC WITH DIFFERENTIAL/PLATELET
Basophils Absolute: 0.1 10*3/uL (ref 0.0–0.1)
Basophils Relative: 0.9 % (ref 0.0–3.0)
Eosinophils Absolute: 0.6 10*3/uL (ref 0.0–0.7)
Eosinophils Relative: 8.3 % — ABNORMAL HIGH (ref 0.0–5.0)
HCT: 36.6 % (ref 36.0–46.0)
Hemoglobin: 12.3 g/dL (ref 12.0–15.0)
Lymphocytes Relative: 43.8 % (ref 12.0–46.0)
Lymphs Abs: 3.2 10*3/uL (ref 0.7–4.0)
MCHC: 33.7 g/dL (ref 30.0–36.0)
MCV: 90.5 fl (ref 78.0–100.0)
Monocytes Absolute: 0.4 10*3/uL (ref 0.1–1.0)
Monocytes Relative: 6.1 % (ref 3.0–12.0)
Neutro Abs: 3 10*3/uL (ref 1.4–7.7)
Neutrophils Relative %: 40.9 % — ABNORMAL LOW (ref 43.0–77.0)
Platelets: 255 10*3/uL (ref 150.0–400.0)
RBC: 4.04 Mil/uL (ref 3.87–5.11)
RDW: 14.1 % (ref 11.5–14.6)
WBC: 7.4 10*3/uL (ref 4.5–10.5)

## 2010-09-29 LAB — HEPATIC FUNCTION PANEL: Albumin: 4.2 g/dL (ref 3.5–5.2)

## 2010-09-29 LAB — BASIC METABOLIC PANEL
BUN: 18 mg/dL (ref 6–23)
Creatinine, Ser: 1 mg/dL (ref 0.4–1.2)
GFR: 72.49 mL/min (ref >60.00)
Potassium: 4.1 mEq/L (ref 3.5–5.1)

## 2010-09-29 LAB — FERRITIN: Ferritin: 22.3 ng/mL (ref 10.0–291.0)

## 2010-09-29 LAB — TSH: TSH: 1.16 u[IU]/mL (ref 0.35–5.50)

## 2010-09-29 MED ORDER — PEG-KCL-NACL-NASULF-NA ASC-C 100 G PO SOLR: 1.0000 | Freq: Once | ORAL | Status: DC

## 2010-09-29 MED ORDER — DEXLANSOPRAZOLE 60 MG PO CPDR: 60.0000 mg | DELAYED_RELEASE_CAPSULE | Freq: Every day | ORAL | Status: AC

## 2010-09-29 NOTE — Patient Instructions (Addendum)
Your procedure has been scheduled for 10/17/2010, please follow the seperate instructions.  Your prescription(s) have been sent to you pharmacy.  Please go to the basement today for your labs.  Please follow the Step 3 Dysphagia diet given to you today.  Take the Dexilant once a day 30 min before breakfast, samples given an rx sent to your pharmacy.

## 2010-09-29 NOTE — Progress Notes (Signed)
History of Present Illness:  This is a very nice 65 year old [place] American female who presents with progressive solid food dysphagia for the last several months. Barium swallow shows some barium in the mid esophagus and a small hiatal hernia. The barium tablet did hold up in the mid esophageal area. Patient has moderate acid reflux symptoms, but does have regurgitation and occasional substernal burning. She is not on any PPI or H2 blocker therapy. The patient has never had endoscopy or colonoscopy, and denies lower gastrointestinal or hepatobiliary complaints. Her weight is stable and she denies any food intolerances.  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 Apparently her hemoglobin A1c is approximately 8, She does complain of early satiety but no nausea and vomiting. Family history is noncontributory. She does not smoke, abuse ethanol or NSAIDs. She does have well-controlled depression taking Prozac 20 mg a day, and she also is on aspirin 81 mg a day but no other anticoagulants.  I have reviewed this patient's present history, medical and surgical past history, allergies and medications.     ROS: The remainder of the 10 point ROS is negative... she does report chronic low back pain, nonproductive cough, stable depression, recurrent stress-related headaches, diffuse pleuritis required Atarax therapy, night sweats, and chronic insomnia, and she is only Ambien on nightly basis. In addition is that she is on oral diabetic medications. Today she denies any cardiovascular, other pulmonary, genitourinary, or additional neurological problems. She also denies visual problems from her diabetes.  Past Medical History  Diagnosis Date  . Diabetes mellitus   . Asthma     childhood  . Depression   . Anxiety   . Hypertension   . Hyperlipidemia   . History of  herpes zoster 02/2010    Recovered fully after period of acute herpetic neuralgia tx w/ gabapentin.   Marland Kitchen Hx MRSA infection 2009  . Dysphagia    Past Surgical History  Procedure Date  . Abdominal hysterectomy     unclear when    reports that she has been smoking Cigarettes.  She has a 10 pack-year smoking history. She has never used smokeless tobacco. She reports that she drinks alcohol. She reports that she does not use illicit drugs. family history is negative for Colon cancer. Allergies  Allergen Reactions  . Pioglitazone     REACTION: Desquamation of skin of the palm  . Sulfonamide Derivatives         Physical Exam: General well developed well nourished patient in no acute distress, appearing her stated age Eyes PERRLA, no icterus, fundoscopic exam per opthamologist Skin no lesions noted Neck supple, no adenopathy, no thyroid enlargement, no tenderness Chest clear to percussion and auscultation Heart no significant murmurs, gallops or rubs noted Abdomen no hepatosplenomegaly masses or tenderness, BS normal. . Extremities no acute joint lesions, edema, phlebitis or evidence of cellulitis. Neurologic patient oriented x 3, cranial nerves intact, no focal neurologic deficits noted. Psychological mental status normal and normal affect.  Assessment and plan: Probable gastroesophageal reflux disease and peptic strictures of the esophagus. The location of her esophageal narrowing service suggest other possible etiologies including eosinophilic esophagitis, Barbaraann Faster syndrome, or malignancy. I have ordered labs for review include an anemia profile, liver function tests, and we'll schedule endoscopy with possible dilatation, and the patient also needs screening colonoscopy which she has never had performed. We have scheduled these procedures with adjustment in her  diabetic medications. He will be a good candidate for propofol sedation per multiple medical problems and multiple  sedative medications. I have prescribed Dexilant 60 mg a day along with standard antireflux maneuvers and a step 3 dysphagia diet. Continue her medications as per primary care. Her primary care physician is Dr. Jasper Riling.   Please copy her primary care physician, referring physician, and pertinent subspecialists.  Encounter Diagnoses  Name Primary?  Marland Kitchen Dysphagia   . Anemia

## 2010-09-30 ENCOUNTER — Encounter: Payer: Self-pay | Admitting: Internal Medicine

## 2010-10-07 ENCOUNTER — Encounter: Payer: Self-pay | Admitting: Internal Medicine

## 2010-10-07 ENCOUNTER — Ambulatory Visit (INDEPENDENT_AMBULATORY_CARE_PROVIDER_SITE_OTHER): Payer: Self-pay | Admitting: Internal Medicine

## 2010-10-07 VITALS — BP 134/89 | HR 83 | Temp 98.0°F | Ht 64.0 in | Wt 197.6 lb

## 2010-10-07 DIAGNOSIS — G4700 Insomnia, unspecified: Secondary | ICD-10-CM

## 2010-10-07 DIAGNOSIS — F329 Major depressive disorder, single episode, unspecified: Secondary | ICD-10-CM

## 2010-10-07 DIAGNOSIS — E119 Type 2 diabetes mellitus without complications: Secondary | ICD-10-CM

## 2010-10-07 DIAGNOSIS — R1310 Dysphagia, unspecified: Secondary | ICD-10-CM

## 2010-10-07 DIAGNOSIS — I10 Essential (primary) hypertension: Secondary | ICD-10-CM

## 2010-10-07 DIAGNOSIS — K219 Gastro-esophageal reflux disease without esophagitis: Secondary | ICD-10-CM

## 2010-10-07 LAB — POCT GLYCOSYLATED HEMOGLOBIN (HGB A1C): Hemoglobin A1C: 6

## 2010-10-07 MED ORDER — INSULIN NPH ISOPHANE & REGULAR (70-30) 100 UNIT/ML ~~LOC~~ SUSP: 10.0000 [IU] | Freq: Two times a day (BID) | SUBCUTANEOUS | Status: DC

## 2010-10-07 MED ORDER — METFORMIN HCL 1000 MG PO TABS: 1000.0000 mg | ORAL_TABLET | Freq: Two times a day (BID) | ORAL | Status: DC

## 2010-10-07 MED ORDER — ZOLPIDEM TARTRATE 5 MG PO TABS: 5.0000 mg | ORAL_TABLET | Freq: Every evening | ORAL | Status: DC | PRN

## 2010-10-07 MED ORDER — FLUOXETINE HCL 20 MG PO CAPS: 40.0000 mg | ORAL_CAPSULE | Freq: Every day | ORAL | Status: DC

## 2010-10-07 NOTE — Assessment & Plan Note (Signed)
Continue PPI ?

## 2010-10-07 NOTE — Assessment & Plan Note (Signed)
HbA1C improved to 6.0 today (from 7.6)! This actually demonstrates control that is more strict than necessary. Patient denies any low blood sugars. Continue current regimen for now. I have advised her to call if she develops low blood sugars or if she reduces her caloric intake (she is interested in losing weight) because her insulin dose may need to be reduced.

## 2010-10-07 NOTE — Assessment & Plan Note (Signed)
Well-controlled.  Continue current regimen. 

## 2010-10-07 NOTE — Progress Notes (Signed)
Subjective:    Patient ID: [name], female    DOB: 06-22-1945, 65 y.o.   MRN: [id number]  HPI 65 year old woman with history of diabetes, hypertension, and obesity presents for followup of: 1. Difficulty swallowing. Recently seen by Dr. Sharlett Iles (GI) on 09/29/2010 for complaints of difficulty swallowing food and pills. She has suspected GERD and possible peptic strictures of the esophagus. Dr. Sharlett Iles has scheduled her for EGD with possible dilation and screening colonoscopy on 10/17/2010. He has also started her on a PPI (dexlansoprazole). She is tolerating this well and symptoms of swallowing difficulty currently unchanged.  2. DM. Patient brought written glucose meter record which demonstrates CBGs 120s-170s. She has been taking metfomin 1000mg  twice daily and 10 units 70/30 insulin twice daily. She denies any symptoms of hypo- or hyperglycemia. Denies any low blood sugar readings (states the lowest she's had recently is 89).  3. Depression. Mood has been somewhat better. Taking prozac daily. Still has stress related to caring for neighbor on dialysis but generally handling it better ("she still gets on my nerves some days").    Current Outpatient Prescriptions on File Prior to Visit  Medication Sig Dispense Refill  . aspirin EC 81 MG EC tablet Take 81 mg by mouth daily.        . BL Lancets Thin MISC 1 each by Does not apply route 2 (two) times daily before a meal.  100 each  1  . dexlansoprazole (DEXILANT) 60 MG capsule Take 1 capsule (60 mg total) by mouth daily.  30 capsule  6  . fluocinonide (LIDEX) 0.05 % cream Apply topically 2 (two) times daily. Apply a thin layer 2-4 times a day       . gabapentin (NEURONTIN) 300 MG capsule Take 1 capsule (300 mg total) by mouth 3 (three) times daily.  90 capsule  1  . glucose blood test strip Use as instructed  100 each  3  . hydrochlorothiazide 25 MG tablet Take 1 tablet (25 mg total) by mouth daily.  30 tablet  5  . hydrOXYzine (ATARAX) 25  MG tablet Take 25 mg by mouth every 6 (six) hours as needed. For itching       . Insulin Syringe-Needle U-100 (INSULIN SYRINGE .5CC/31GX5/16") 31G X 5/16" 0.5 ML MISC 1 each by Does not apply route 2 (two) times daily before a meal.  100 each  3  . lisinopril (PRINIVIL,ZESTRIL) 40 MG tablet Take 1 tablet (40 mg total) by mouth daily.  30 tablet  5  . loratadine (CLARITIN) 10 MG tablet Take 10 mg by mouth daily.        . peg 3350 powder (MOVIPREP) 100 G SOLR Take 1 kit (100 g total) by mouth once.  1 kit  0  . pravastatin (PRAVACHOL) 40 MG tablet Take 1 tablet (40 mg total) by mouth daily.  30 tablet  5  . traMADol (ULTRAM) 50 MG tablet Take 50 mg by mouth every 6 (six) hours as needed. Up to 4 times a day for pain       . DISCONTD: FLUoxetine (PROZAC) 20 MG capsule Take 2 capsules (40 mg total) by mouth daily.  30 capsule  2  . DISCONTD: insulin NPH-insulin regular (RELION 70/30) (70-30) 100 UNIT/ML injection Inject 10 Units into the skin 2 (two) times daily before a meal.  10 mL  3  . DISCONTD: metFORMIN (GLUCOPHAGE) 1000 MG tablet Take 1 tablet (1,000 mg total) by mouth 2 (two) times daily with a  meal. Break pills in half for easier swallowing.  60 tablet  11  . DISCONTD: zolpidem (AMBIEN) 5 MG tablet Take 1 tablet (5 mg total) by mouth at bedtime as needed.  30 tablet  1    Past Medical History  Diagnosis Date  . Diabetes mellitus   . Asthma     childhood  . Depression   . Anxiety   . Hypertension   . Hyperlipidemia   . 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   Marland Kitchen Hx MRSA infection 2009  . Dysphagia      Review of Systems  Constitutional: Negative for fever, chills and appetite change.  HENT: Positive for trouble swallowing. Negative for congestion and sore throat.   Eyes: Negative for visual disturbance.  Respiratory: Negative for cough and shortness of breath.   Cardiovascular: Negative for chest pain and  palpitations.  Gastrointestinal: Negative for nausea, vomiting, abdominal pain, diarrhea, constipation and blood in stool.  Genitourinary: Negative for difficulty urinating.  Musculoskeletal: Negative for arthralgias.  Skin: Negative for rash.  Neurological: Negative for dizziness, weakness, numbness and headaches.  Psychiatric/Behavioral: Positive for dysphoric mood. Negative for suicidal ideas and self-injury. The patient is nervous/anxious.   All other systems reviewed and are negative.       Objective:   Physical Exam  Vitals reviewed. Constitutional: She is oriented to person, place, and time. She appears well-developed and well-nourished. No distress.  HENT:  Head: Normocephalic and atraumatic.  Mouth/Throat: Oropharynx is clear and moist. No oropharyngeal exudate.  Eyes: Conjunctivae and EOM are normal. Pupils are equal, round, and reactive to light. No scleral icterus.  Neck: Normal range of motion. Neck supple. No tracheal deviation present. No thyromegaly present.  Cardiovascular: Normal rate, regular rhythm, normal heart sounds and intact distal pulses.  Exam reveals no gallop and no friction rub.   No murmur heard. Pulmonary/Chest: Effort normal and breath sounds normal. No respiratory distress. She has no wheezes. She has no rales. She exhibits no tenderness.  Abdominal: Soft. Bowel sounds are normal. She exhibits no distension. There is no tenderness.  Musculoskeletal: Normal range of motion. She exhibits no edema and no tenderness.  Lymphadenopathy:    She has no cervical adenopathy.  Neurological: She is alert and oriented to person, place, and time. No cranial nerve deficit.  Skin: Skin is warm and dry. No rash noted. She is not diaphoretic.  Psychiatric: She has a normal mood and affect. Her behavior is normal. Judgment and thought content normal.          Assessment & Plan:

## 2010-10-07 NOTE — Patient Instructions (Signed)
Keep checking your blood sugar regularly. Call our office if you have any low blood sugars (<70) or if your blood sugar is staying above 250.   Watch your diet and stay active.  Follow-up with Dr. Sharlett Iles (Gastroenterology) as scheduled.

## 2010-10-07 NOTE — Assessment & Plan Note (Signed)
Seen by Dr. Sharlett Iles who suspects esophageal strictures related to GERD. EGD scheduled for 10/17/2010.

## 2010-10-17 ENCOUNTER — Ambulatory Visit (AMBULATORY_SURGERY_CENTER): Payer: Self-pay | Admitting: Gastroenterology

## 2010-10-17 ENCOUNTER — Encounter: Payer: Self-pay | Admitting: Gastroenterology

## 2010-10-17 DIAGNOSIS — Z1211 Encounter for screening for malignant neoplasm of colon: Secondary | ICD-10-CM

## 2010-10-17 DIAGNOSIS — R1310 Dysphagia, unspecified: Secondary | ICD-10-CM

## 2010-10-17 DIAGNOSIS — D649 Anemia, unspecified: Secondary | ICD-10-CM

## 2010-10-17 DIAGNOSIS — K2990 Gastroduodenitis, unspecified, without bleeding: Secondary | ICD-10-CM

## 2010-10-17 DIAGNOSIS — K5730 Diverticulosis of large intestine without perforation or abscess without bleeding: Secondary | ICD-10-CM

## 2010-10-17 DIAGNOSIS — D126 Benign neoplasm of colon, unspecified: Secondary | ICD-10-CM

## 2010-10-17 DIAGNOSIS — Z860100 Personal history of colon polyps, unspecified: Secondary | ICD-10-CM

## 2010-10-17 DIAGNOSIS — K219 Gastro-esophageal reflux disease without esophagitis: Secondary | ICD-10-CM

## 2010-10-17 DIAGNOSIS — K2270 Barrett's esophagus without dysplasia: Secondary | ICD-10-CM

## 2010-10-17 DIAGNOSIS — K2970 Gastritis, unspecified, without bleeding: Secondary | ICD-10-CM

## 2010-10-17 HISTORY — DX: Personal history of colon polyps, unspecified: Z86.0100

## 2010-10-17 HISTORY — DX: Personal history of colonic polyps: Z86.010

## 2010-10-17 LAB — GLUCOSE, CAPILLARY: Glucose-Capillary: 106 mg/dL — ABNORMAL HIGH (ref 70–99)

## 2010-10-17 MED ORDER — SODIUM CHLORIDE 0.9 % IV SOLN: 500.0000 mL | INTRAVENOUS | Status: DC

## 2010-10-17 NOTE — Patient Instructions (Signed)
Discharge instructions given with verbal understanding. Handouts on polyps , diverticulosis and a high fiber diet, hiatal hernia, barretts esophagus and gastritis given. Resume previous medications.

## 2010-10-18 ENCOUNTER — Telehealth: Payer: Self-pay | Admitting: *Deleted

## 2010-10-18 NOTE — Telephone Encounter (Signed)

## 2010-10-19 DIAGNOSIS — K219 Gastro-esophageal reflux disease without esophagitis: Secondary | ICD-10-CM

## 2010-10-19 LAB — HELICOBACTER PYLORI SCREEN-BIOPSY: UREASE: NEGATIVE

## 2010-10-21 ENCOUNTER — Encounter: Payer: Self-pay | Admitting: Gastroenterology

## 2010-10-24 ENCOUNTER — Encounter: Payer: Self-pay | Admitting: *Deleted

## 2010-11-02 ENCOUNTER — Encounter: Payer: Self-pay | Admitting: Gastroenterology

## 2010-11-02 ENCOUNTER — Ambulatory Visit (INDEPENDENT_AMBULATORY_CARE_PROVIDER_SITE_OTHER): Payer: Self-pay | Admitting: Gastroenterology

## 2010-11-02 VITALS — BP 118/82 | HR 84 | Ht 65.0 in | Wt 195.4 lb

## 2010-11-02 DIAGNOSIS — K219 Gastro-esophageal reflux disease without esophagitis: Secondary | ICD-10-CM

## 2010-11-02 DIAGNOSIS — Z131 Encounter for screening for diabetes mellitus: Secondary | ICD-10-CM

## 2010-11-02 DIAGNOSIS — K2270 Barrett's esophagus without dysplasia: Secondary | ICD-10-CM

## 2010-11-02 DIAGNOSIS — Z7902 Long term (current) use of antithrombotics/antiplatelets: Secondary | ICD-10-CM | POA: Insufficient documentation

## 2010-11-02 MED ORDER — OMEPRAZOLE 40 MG PO CPDR: 40.0000 mg | DELAYED_RELEASE_CAPSULE | Freq: Every day | ORAL | Status: DC

## 2010-11-02 NOTE — Patient Instructions (Signed)
When you are out of Dexilant samples we have sent a rx for Omeprazole 40mg  take one tablet 30 min before breakfast, this is a long term medication due to your barretts.     Gastroesophageal Reflux Disease (GERD) Your caregiver has diagnosed your chest discomfort as caused by gastroesophageal reflux disease (GERD). GERD is caused by a reflux of acid from your stomach into the digestive tube between your mouth and stomach (esophagus). Acid in contact with the esophagus causes soreness (inflammation) resulting in heartburn or chest pain. It may cause small holes in the lining of the esophagus (ulcers). CAUSES  Increased body weight puts pressure on the stomach, making acid rise.   Smoking increases acid production.   Alcohol decreases pressure on the valve between the stomach and esophagus (lower esophageal sphincter), allowing acid from the stomach into the esophagus.   Late evening meals and a full stomach increase pressure and acid production.   Lower esophageal sphincter is malformed.   Sometimes, no reason is found.  HOME CARE INSTRUCTIONS  Change the factors that you can control. Weight, smoking, or alcohol changes may be difficult to change on your own. Your caregiver can provide guidance and medical therapy.   Raising the head of your bed may help you to sleep.   Over-the-counter medicines will decrease acid production. Your caregiver can also prescribe medicines for this. Only take over-the-counter or prescription medicines for pain, discomfort, or fever as directed by your caregiver.   1/2 to 1 teaspoon of an antacid taken every hour while awake, with meals, and at bedtime, can neutralize acid.   DO NOT take aspirin, ibuprofen, or other nonsteroidal anti-inflammatory drugs (NSAIDs).  SEEK IMMEDIATE MEDICAL CARE IF:  The pain changes in location (radiates into arms, neck, jaw, teeth, or back), intensity, or duration.   You start feeling sick to your stomach (nauseous), start  throwing up (vomiting), or sweating (diaphoresis).   You develop left arm or jaw pain.   You develop pain going into your back, shortness of breath, or you pass out.   There is vomiting of fluid that is green, yellow, or looks like coffee grounds or blood.  These symptoms could signal other problems, such as heart disease. MAKE SURE YOU:  Understand these instructions.   Will watch your condition.   Will get help right away if you are not doing well or get worse.  Document Released: 01/18/2005 Document Re-Released: 03/14/2011[employer] Patient Information 2011 [place].    Barrett's Esophagus The esophagus is the muscular tube that carries food and saliva from the mouth to the stomach. Barrett's esophagus involves changes in the esophagus. Some of its lining is replaced by a type of tissue similar to that found in the intestine. This process is called intestinal metaplasia. While Barrett's esophagus may cause no symptoms itself, a small number of people with this condition develop a relatively rare but often deadly type of cancer of the esophagus. It is called esophageal adenocarcinoma. Barrett's esophagus is associated with the common condition called GERD (gastroesophageal reflux disease). HOW THE ESOPHAGUS WORKS The esophagus carries food, liquids, and saliva from the mouth to the stomach. The stomach acts as a container to start digestion and pump food and liquids into the intestines in a controlled process. Food can then be properly digested over time. Nutrients can be taken in (absorbed) by the intestines. The esophagus moves food to the stomach by coordinated contractions of its muscular lining. This process is automatic. People are usually not aware  of it. Many people have felt their esophagus when they:  Swallow something too large.   Try to eat too quickly.   Drink very hot or cold liquids.  They then feel the movement of the food or drink down the esophagus into the  stomach. This may be an uncomfortable feeling. DIGESTIVE TRACT The muscular layers of the esophagus are normally pinched together at both the upper and lower ends by muscles. These muscles are called sphincters. When a person swallows, the sphincters relax automatically. This allows food or drink to pass from the mouth, into the stomach. The muscles then close rapidly. This prevents the swallowed food or drink from leaking out of the stomach, back into the esophagus or into the mouth. These muscles make it possible to swallow while lying down or even upside-down. When people belch to release swallowed air or gas from carbonated beverages, the sphincters relax. Then small amounts of food or drink may come back up, briefly. This condition is called reflux. The esophagus quickly squeezes the material back into the stomach. This is considered normal. These functions of the esophagus are an important part of everyday life. However, people who must have their esophagus removed, for example because of cancer, can live a relatively healthy life without it. GERD (GASTROESOPHAGEAL REFLUX DISEASE) Having some stomach contents (liquids or gas) sometimes reflux (come back up from the stomach into the esophagus) is considered normal. When it happens often, and causes other symptoms, it is considered a medical problem or disease. However, it is not necessarily a serious one or one that requires seeing a caregiver. The stomach produces acid and enzymes to digest food. When this mixture refluxes into the esophagus more often than normal or for a longer period of time than normal, it may produce symptoms. These symptoms are often called acid reflux. They are often described by people as heartburn, indigestion, or "gas". The symptoms often consist of a burning sensation below and behind the lower part of the breastbone or sternum. Almost everyone has experienced these symptoms at least once. This is typically a result of  overeating. Other things that provoke GERD symptoms include:  Being overweight.   Eating certain types of foods.   Being pregnant.  In most people, GERD symptoms may last only a short time and require no treatment at all.  More continual symptoms are often quickly relieved by over-the-counter acid-reducing agents, such as antacids. Other drugs used to relieve GERD symptoms are antisecretory drugs, such as histamine2 (H2) blockers or proton pump inhibitors. People who have symptoms often should talk with their caregiver. Other diseases can have similar symptoms. Prescription medicines, together with other actions, might be needed to reduce reflux. GERD that is untreated over a long period can lead to problems. An example is an ulcer in the esophagus, that could cause bleeding. Another common problem is scar tissue that blocks the movement of swallowed food and drink through the esophagus. This condition is called stricture. Esophageal reflux may also cause certain less common symptoms. These include hoarseness or chronic cough. It sometimes provokes conditions such as asthma. While most patients find that lifestyle changes and acid-blocking drugs relieve their symptoms, caregivers sometimes advise surgery. Overall, GERD is one of the most common medical conditions. About 20 percent of the population can be affected over a lifetime. GERD AND BARRETT'S ESOPHAGUS The exact causes of Barrett's esophagus are not known. It is thought to be caused in part by the same factors that cause GERD. People  who do not have heartburn can have Barrett's esophagus. However, it is found about 3 to 5 times more often in people with this condition. Barrett's esophagus is uncommon in children. The average age at diagnosis is 87. But it is usually difficult to know when the problem started. It is about twice as common in men as in women. It is much more common in white men than in men of other races. BARRETT'S ESOPHAGUS AND  CANCER OF THE ESOPHAGUS Barrett's esophagus does not cause symptoms itself. However, it seems to precede the development of a particular kind of cancer. This cancer is esophageal adenocarcinoma. The risk of developing this cancer is 30 to 125 times higher in people who have Barrett's esophagus than in people who do not. This type of cancer is increasing quickly in white men. The increase is possibly related to the rise in obesity and GERD. For people who have Barrett's esophagus, the risk of getting cancer of the esophagus is small. It is less than 1 percent (0.4 percent to 0.5 percent) per year. Esophageal adenocarcinoma is often not curable. This is partly because the disease is often discovered at a late stage and treatments are not effective. DIAGNOSIS (HOW TO TELL WHAT IS WRONG) AND SCREENING Diagnosing Barrett's esophagus is not easy. At the present time it cannot be diagnosed based on symptoms, physical exam, or blood tests. The only useful test is upper gastrointestinal endoscopy and biopsy. In this procedure, a flexible tube called an endoscope is used. This tool is like a pencil sized flexible telescope. It has a light and tiny camera. It is passed into the esophagus. If the tissue appears suspicious to your caregiver, biopsies must be done. A biopsy is the removal of a small piece of tissue. This is done using a pincher-like device passed through the endoscope. A pathologist is a specialist who examines body tissue samples. He or she examines the tissue under a microscope to confirm the diagnosis. Looking for a medical problem in people who do not know whether they have one is called screening. Currently, there are no commonly accepted guidelines on who should have endoscopy, to check for Barrett's esophagus. There are many reasons for the lack of firm recommendations about screening. Among them are the great cost and occasional risk of side effects of the test. Also, the rate of finding Barrett's  esophagus is low. Finding the problem early has not been proven to prevent deaths from cancer. Many caregivers advise that adult patients who are over the age of 17 and have had GERD symptoms for a number of years have endoscopy, to see whether they have Barrett's esophagus. Screening for this condition in people who have no symptoms is not advised. TREATMENT Barrett's esophagus has no cure, other than surgical removal of the esophagus. This is a serious operation. Surgery is advised only for people who have a high risk of developing cancer or who already have it. Most caregivers recommend treating GERD with acid-blocking drugs. This is sometimes linked to improvement in the extent of the Barrett's tissue. But this approach has not been proven to reduce the risk of cancer. Treating reflux with surgery for GERD also does not seem to cure Barrett's esophagus. Several experimental approaches are under study. One attempts to see whether destroying the Barrett's tissue by heat or other means, through an endoscope, can get rid of the condition. But this approach has potential risks and unknown effectiveness. LOOKING FOR DYSPLASIA AND CANCER Occasional endoscopic examinations to look  for early warning signs of cancer are generally advised for people who have Barrett's esophagus. This approach is called surveillance. When people who have Barrett's esophagus develop cancer, the process seems to go through an intermediate stage. In this stage cancer cells appear in the Barrett's tissue. This condition is called dysplasia. It can be seen only in biopsies with a microscope. The process is patchy and cannot be seen directly through the endoscope. So, multiple biopsies must be taken. Even then, it can be missed. The process of change from Barrett's to cancer seems to happen only in a few patients. It is less than 1 percent per year. And it happens over a relatively long period of time. Most caregivers advise that patients  with Barrett's esophagus undergo occasional endoscopy to have biopsies. The recommended time between endoscopies varies depending on circumstances. The best time interval has not been decided. TREATMENT FOR DYSPLASIA OR ESOPHAGEAL ADENOCARCINOMA If a person with Barrett's esophagus is found to have dysplasia or cancer, the caregiver will usually recommend surgery. This is if the person is strong enough and has a good chance of being cured. The type of surgery may vary. But it usually involves removing most of the esophagus and pulling the stomach up into the chest to attach it to what remains of the esophagus. Many patients with Barrett's esophagus are elderly. They may have many other medical problems that make surgery unwise. In these patients, other methods to treat dysplasia are being studied. [place] Many important questions about Barrett's esophagus need further research to:  Find better ways to identify people who have the problem.   Find out what causes it.   Test treatments that may prevent or get rid of it.   Find better treatments for people who have Barrett's esophagus with cancer.  The [employer] and [employer] [employer] Diseases, and the [employer], sponsor research programs to study Barrett's esophagus. IMPORTANT POINTS TO REMEMBER  In Barrett's esophagus, the cells lining the esophagus change. They become similar to the cells lining the intestine.   Barrett's esophagus is connected with gastroesophageal reflux disease or GERD.   A small number of people with Barrett's esophagus may develop esophageal cancer.   Barrett's esophagus is diagnosed by upper gastrointestinal endoscopy and biopsy.   People who have Barrett's esophagus should have periodic esophageal exams.   Taking acid-blocking drugs for GERD may help improve Barrett's esophagus.   Removal of the esophagus is recommended only for people who have a high risk of  developing cancer or who already have it.  ADDITIONAL RESOURCES For more information about GERD or Barrett's esophagus, contact: [employer] ([employer] [id number] [place] [address] Phone: [phone] or [phone] Fax: [phone] Email: iffgd@iffgd .org Internet: [email or web address].[email or web address] [address], [place] [address] Email: nddic@info .[employer] Document Released: 07/01/2003 Document Re-Released: 09/28/2009 [facility] Patient Information 2011 [place].

## 2010-11-02 NOTE — Progress Notes (Signed)
History of Present Illness: This is a 65 year old African American female who had recent colonoscopy with removal of benign hyperplastic polyps. Endoscopy because of chronic GERD did show a large hiatal hernia, esophageal erosions, and biopsy confirmed Barrett's mucosa. She currently is asymptomatic on Dexilant 60 mg a day. She is on multiple medications listed and reviewed her chart including insulin therapy for her diabetes. She denies a history of hepatitis, pancreatitis, and has no current hepatobiliary or lower gastrointestinal complaints.  Past Medical History  Diagnosis Date  . Diabetes mellitus   . Asthma     childhood  . Depression   . Anxiety   . Hypertension   . Hyperlipidemia   . 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   Marland Kitchen Hx MRSA infection 2009  . Dysphagia   . Personal history of colonic polyps 10/17/2010    hyperplastic  . Barrett's esophagus   . Hiatal hernia    Past Surgical History  Procedure Date  . Abdominal hysterectomy     unclear when    reports that she has been smoking Cigarettes.  She has a 10 pack-year smoking history. She has never used smokeless tobacco. She reports that she drinks about one ounce of alcohol per week. She reports that she does not use illicit drugs. family history is negative for Colon cancer. Allergies  Allergen Reactions  . Pioglitazone     REACTION: Desquamation of skin of the palm  . Sulfonamide Derivatives      Current Medications, Allergies, Past Medical History, Past Surgical History, Family History and Social History were reviewed in [employer] record.   Assessment and plan: Large hiatal hernia and chronic GERD with Barrett's mucosa. Reflux regime reviewed with patient and we will try to maintain her on a daily PPI that she can afford per her insurance plan. She will need every 3 years endoscopy and biopsies. We will see her on when  necessary basis as needed. Biopsies of her stomach for H. pylori were negative. No diagnosis found.

## 2010-11-24 ENCOUNTER — Encounter: Payer: Self-pay | Admitting: Internal Medicine

## 2010-11-24 ENCOUNTER — Ambulatory Visit (INDEPENDENT_AMBULATORY_CARE_PROVIDER_SITE_OTHER): Payer: Self-pay | Admitting: Internal Medicine

## 2010-11-24 DIAGNOSIS — F3289 Other specified depressive episodes: Secondary | ICD-10-CM

## 2010-11-24 DIAGNOSIS — F329 Major depressive disorder, single episode, unspecified: Secondary | ICD-10-CM

## 2010-11-24 DIAGNOSIS — I10 Essential (primary) hypertension: Secondary | ICD-10-CM

## 2010-11-24 DIAGNOSIS — G4700 Insomnia, unspecified: Secondary | ICD-10-CM

## 2010-11-24 DIAGNOSIS — E119 Type 2 diabetes mellitus without complications: Secondary | ICD-10-CM

## 2010-11-24 MED ORDER — PRAVASTATIN SODIUM 40 MG PO TABS: 40.0000 mg | ORAL_TABLET | Freq: Every day | ORAL | Status: DC

## 2010-11-24 MED ORDER — ASPIRIN EC 81 MG PO TBEC: 81.0000 mg | DELAYED_RELEASE_TABLET | Freq: Every day | ORAL | Status: DC

## 2010-11-24 MED ORDER — FLUOXETINE HCL 20 MG PO CAPS: 40.0000 mg | ORAL_CAPSULE | Freq: Every day | ORAL | Status: DC

## 2010-11-24 MED ORDER — ZOLPIDEM TARTRATE 5 MG PO TABS: 10.0000 mg | ORAL_TABLET | Freq: Every evening | ORAL | Status: DC | PRN

## 2010-11-24 NOTE — Patient Instructions (Signed)
You were seen today for a follow up. Your Lorrin Mais was changed to 10 mg today and you will take 2 tablets of 5 mg at bedtime for sleep. None of your other medications were changed. I will see you back in 6 months. If you have any questions please feel free to call our clinic at [phone]. If you need any refills also please feel free to call our clinic.   Diabetes Meal Planning Guide The diabetes meal planning guide is a tool to help you plan your meals and snacks. It is important for people with diabetes to manage their blood sugar levels. Choosing the right foods and the right amounts throughout your day will help control your blood sugar. Eating right can even help you improve your blood pressure and reach or maintain a healthy weight. CARBOHYDRATE COUNTING MADE EASY When you eat carbohydrates, they turn to sugar (glucose). This raises your blood sugar level. Counting carbohydrates can help you control this level so you feel better. When you plan your meals by counting carbohydrates, you can have more flexibility in what you eat and balance your medicine with your food intake. Carbohydrate counting simply means adding up the total amount of carbohydrate grams (g) in your meals or snacks. Try to eat about the same amount at each meal. Foods with carbohydrates are listed below. Each portion below is 1 carbohydrate serving or 15 grams of carbohydrates. Ask your dietician how many grams of carbohydrates you should eat at each meal or snack. Grains and Starches 1 slice bread 1/2 English muffin or hotdog/hamburger bun 3/4 cup cold cereal (unsweetened) 1/3 cup cooked pasta or rice 1/2 cup starchy vegetables (corn, potatoes, peas, beans, winter squash) 1 tortilla (6 inches) 1/4 bagel 1 waffle or pancake (size of a CD) 1/2 cup cooked cereal 4 to 6 small crackers *Whole grain is recommended Fruit 1 cup fresh unsweetened berries, melon, papaya, pineapple 1 small fresh fruit 1/2 banana or mango 1/2  cup fruit juice (4 ounces unsweetened) 1/2 cup canned fruit in natural juice or water 2 tablespoons dried fruit 12 to 15 grapes or cherries Milk and Yogurt 1 cup fat-free or 1% milk 1 cup soy milk 6 ounces light yogurt with sugar-free sweetener 6 ounces low-fat soy yogurt 6 ounces plain yogurt Vegetables 1 cup raw or 1/2 cup cooked is counted as 0 carbohydrates or a "free" food. If you eat 3 or more servings at one meal, count them as 1 carbohydrate serving. Other Carbohydrates 3/4 ounces chips or pretzels 1/2 cup ice cream or frozen yogurt 1/4 cup sherbet or sorbet 2 inch square cake, no frosting 1 tablespoon honey, sugar, jam, jelly, or syrup 2 small cookies 3 squares of graham crackers 3 cups popcorn 6 crackers 1 cup broth-based soup Count 1 cup casserole or other mixed foods as 2 carbohydrate servings. Foods with less than 20 calories in a serving may be counted as 0 carbohydrates or a "free" food. You may want to purchase a book or computer software that lists the carbohydrate gram counts of different foods. In addition, the nutrition facts panel on the labels of the foods you eat are a good source of this information. The label will tell you how big the serving size is and the total number of carbohydrate grams you will be eating per serving. Divide this number by 15 to obtain the number of carbohydrate servings in a portion. Remember: 1 carbohydrate serving equals 15 grams of carbohydrate. SERVING SIZES Measuring foods and serving sizes  helps you make sure you are getting the right amount of food. The list below tells how big or small some common serving sizes are.  1 ounce (oz) of cheese.................................4 stacked dice.   2 to 3 oz cooked meat.................................Marland KitchenDeck of cards.   1 teaspoon (tsp)...........................................Marland KitchenTip of little finger.   1 tablespoon (tbs).......................................Marland KitchenMarland KitchenThumb.   2  tbs............................................................Marland KitchenGolf ball.    cup..........................................................Marland KitchenHalf of a fist.   1 cup...........................................................Marland KitchenA fist.  SAMPLE DIABETES MEAL PLAN Below is a sample meal plan that includes foods from the grain and starches, dairy, vegetable, fruit, and meat groups. A dietician can individualize a meal plan to fit your calorie needs and tell you the number of servings needed from each food group. However, controlling the total amount of carbohydrates in your meal or snack is more important than making sure you include all of the food groups at every meal. You may interchange carbohydrate containing foods (dairy, starches, and fruits). The meal plan below is an example of a 2000 calorie diet using carbohydrate counting. This meal plan has 17 carbohydrate servings (carb choices). Breakfast 1 cup oatmeal (2 carb choices) 3/4 cup light yogurt (1 carb choice) 1 cup blueberries (1 carb choice) 1/4 cup almonds  Snack 1 large apple (2 carb choices) 1 low-fat string cheese stick  Lunch Chicken breast salad:  1 cup spinach   1/4 cup chopped tomatoes   2 oz chicken breast, sliced   2 tbs low-fat [place] dressing  12 whole-wheat crackers (2 carb choices) 12 to 15 grapes (1 carb choice) 1 cup low-fat milk (1 carb choice)  Snack 1 cup carrots 1/2 cup hummus (1 carb choice)  Dinner 3 oz broiled salmon 1 cup brown rice (3 carb choices)  Snack 1 1/2 cups steamed broccoli (1 carb choice) drizzled with 1 tsp olive oil and lemon juice 1 cup light pudding (2 carb choices)  DIABETES MEAL PLANNING WORKSHEET Your dietician can use this worksheet to help you decide how many servings of foods and what types of foods are right for you.  Breakfast Food Group and Servings Carb Choices Grain/Starches _______________________________________ Dairy  ______________________________________________ Vegetable _______________________________________ Fruit _______________________________________________ Meat _______________________________________________ Fat _____________________________________________ Lunch Food Group and Servings Carb Choices Grain/Starches ________________________________________ Dairy _______________________________________________ Fruit ________________________________________________ Meat ________________________________________________ Fat _____________________________________________ Dinner Food Group and Servings Carb Choices Grain/Starches ________________________________________ Dairy _______________________________________________ Fruit ________________________________________________ Meat ________________________________________________ Fat _____________________________________________ Snacks Food Group and Servings Carb Choices Grain/Starches ________________________________________ Dairy _______________________________________________ Vegetable ________________________________________ Fruit ________________________________________________ Meat ________________________________________________ Fat _____________________________________________ Daily Totals Starches _________________________ Vegetable __________________________ Fruit ______________________________ Dairy ______________________________ Meat ______________________________ Fat ________________________________  Document Released: 01/05/2005 Document Re-Released: 09/28/2009   [employer] Patient Information 2011 [place].Diabetes and Exercise Regular exercise is important and can help:   Control blood glucose (sugar).   Decrease blood pressure.   Control blood lipids (cholesterol and triglycerides).   Improve overall health.  BENEFITS FROM EXERCISE:  Improved fitness.   Improved flexibility.   Improved endurance.    Increased bone density.   Weight control.   Increased muscle strength.   Decreased body fat.   Improvement of the body's use of a hormone called insulin.   Increased insulin sensitivity.   Reduction of insulin needs.   Helps you feel better.   Reduces stress and tension.  People with diabetes who add exercise to their lifestyle gain additional benefits.   Weight loss.   Reduces appetite.   Improves body's use of blood glucose (sugar).   Decreases risk factors for heart disease:   Lowering of cholesterol and  triglycerides.   Raising the level of good cholesterol (high-density lipoproteins [HDL]).   Lowering blood sugar.   Decreases blood pressure.  TYPE 1 DIABETES AND EXERCISE  Exercise will usually lower your blood glucose.   If blood glucose is greater than 240 mg/dl, check urine ketones. If ketones are present, do not exercise.   Location of the insulin injection sites may need to be adjusted with exercise. Avoid injecting insulin into areas of the body that will be exercised. For example, avoid injecting insulin into:   The arms when playing tennis.   The legs when jogging. For more information, discuss this with your caregiver.   Keep a record of:   Food intake.   Type and amount of exercise.   Expected peak times of insulin action.   Blood glucose (sugar) levels.  Do this before, during and after exercise. Review your records with your caregiver(s). This will help you to develop guidelines for adjusting food intake and/or insulin amounts.  TYPE 2 DIABETES AND EXERCISE  Regular physical activity can help control blood glucose.   Exercise is important because it may:   Increase the body's sensitivity to insulin.   Improve blood glucose control.   Exercise reduces the risk of heart disease. It decreases serum cholesterol and triglycerides. It also lowers blood pressure.   Those who take insulin or oral hypoglycemic agents should watch for  signs of hypoglycemia. These signs include dizziness, shaking, sweating, chills and confusion.   Body water is lost during exercise. It must be replaced. This will help to avoid loss of body fluids (dehydration) and/or heat stroke.  Be sure to talk to your caregiver before starting an exercise program to make sure it is safe for you. Remember, any activity is better than none.  Document Released: 07/01/2003 Document Re-Released: 02/05/2009 [facility] Patient Information 2011 [place].

## 2010-11-24 NOTE — Progress Notes (Signed)
Subjective:    Patient ID: [name], female    DOB: [birth year], 65 y.o.   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 She presents today for a F/U on her diabetes. After reviewing her blood sugar log and looking at her last HgA1C (6.0 on 10/07/10) there were no hypoglycemic values that were measured with the range being between 93-191. She does take 10 units of 70/30 NPH insulin before breakfast and dinner as well as taking 1000 mg of metformin twice daily. She does seem to have good control and we did discuss diet and exercise being important factors in staying and improving her health. She does still have insomnia and wakes up frequently at night. We will do a trial of 10 mg of ambien from 5 mg to see if that helps her stay asleep longer. I will give her refills on her prozac, pravastatin, and ambien today. I have informed her that she is free to call the office for refills or if she needs an appointment sooner than scheduled. She has no complaints today other than occasional headache for which she takes a goody powder. She is having some increased amounts of stress in her life right now as her parents are in the middle of a divorce and she is frustrated with her father (who lives next door to her).     Review of Systems  Constitutional: Negative for activity change, appetite change and unexpected weight change.  HENT: Negative for sore throat and trouble swallowing.   Eyes: Negative.   Respiratory: Negative for cough, choking, chest tightness and shortness of breath.   Cardiovascular: Negative for chest pain and leg swelling.  Gastrointestinal: Negative for abdominal pain, diarrhea and constipation.  Genitourinary: Negative for dysuria and difficulty urinating.  Skin: Negative.   Neurological: Positive for headaches. Negative for dizziness,  weakness, light-headedness and numbness.  Psychiatric/Behavioral: Positive for sleep disturbance. Negative for suicidal ideas, behavioral problems and self-injury. The patient is not nervous/anxious.        Objective:   Physical Exam  Constitutional: She is oriented to person, place, and time. She appears well-developed and well-nourished.  HENT:  Head: Normocephalic and atraumatic.  Eyes: EOM are normal. Pupils are equal, round, and reactive to light.  Neck: No JVD present. No thyromegaly present.  Cardiovascular: Normal rate, regular rhythm, normal heart sounds and intact distal pulses.   Pulmonary/Chest: Effort normal and breath sounds normal. No respiratory distress. She exhibits no tenderness.  Abdominal: Soft. Bowel sounds are normal. She exhibits no distension. There is no tenderness. There is no rebound.  Lymphadenopathy:    She has no cervical adenopathy.  Neurological: She is alert and oriented to person, place, and time. No cranial nerve deficit. Coordination normal.  Skin: Skin is warm and dry.  Psychiatric: She has a normal mood and affect. Her behavior is normal. Thought content normal.          Assessment & Plan:  1. DM II - continue current treatment plan with metformin 1000 BID and regular NPH 70/30 10 units before breakfast and dinner. We will see her back in 6 months and re-check a FLP and HgA1c at that time. Encouraged exercise and good diet and did give her information about diet and exercise.  2. HTN - BP today 123/77 which is at goal for diabetics (<130/<80). We will  continue her HCTZ 25 mg daily and lisinopril 40 mg daily. Re-check her BP in 6 months.  3. HPL - last FLP showed LDL of 147 and pravachol was increased to 40 mg daily at that time. We will re-check a FLP in 6 months and adjust therapy at that time.  4. Barrett's esophagus - will need to be followed with EGD yearly.  5. Depression - controlled and stable today. Will continue prozac 40 mg  daily.  6. Insomnia - will increase Ambien to 10 mg QHS to see if sleep quality improves.

## 2010-11-24 NOTE — Progress Notes (Signed)
[name] history, physical examination, and previous labs were reviewed with Dr. Doug Sou.  With the frequent awakenings at night the Ambien dose increase may not be effective as its effects wear off rather early in the night as it is best for promoting sleep.  If the Ambien is ineffective we should explore sleep hygiene and assess for depression given the stresses in her life currently.  If there is depression that is not well controlled we can try to manage her symptoms better.  If it is simply a problem of staying asleep without other causes she may benefit from a longer acting sleep aid such as trazodone, etc.  Dr. Doug Sou and I formulated the assessment and plan together and I agree with the above documentation.

## 2010-11-30 ENCOUNTER — Encounter: Payer: Self-pay | Admitting: Internal Medicine

## 2011-01-18 LAB — CBC
HCT: 29.9 — ABNORMAL LOW
HCT: 32 — ABNORMAL LOW
HCT: 33.2 — ABNORMAL LOW
HCT: 29 — ABNORMAL LOW
Hemoglobin: 10.1 — ABNORMAL LOW
Hemoglobin: 10.8 — ABNORMAL LOW
MCHC: 33.3
MCHC: 33.7
MCHC: 33.7
MCHC: 34.5
MCHC: 34.5
MCV: 87.9
MCV: 88.2
MCV: 88.3
MCV: 88.6
MCV: 88.7
MCV: 88
Platelets: 296
Platelets: 356
Platelets: 367
Platelets: 408 — ABNORMAL HIGH
RBC: 3.31 — ABNORMAL LOW
RBC: 3.39 — ABNORMAL LOW
RBC: 3.64 — ABNORMAL LOW
RBC: 4.25
RDW: 13
RDW: 13.2
RDW: 13.3
RDW: 12.7
WBC: 11.7 — ABNORMAL HIGH
WBC: 7.3
WBC: 9.6

## 2011-01-18 LAB — SODIUM, URINE, RANDOM: Sodium, Ur: <10

## 2011-01-18 LAB — COMPREHENSIVE METABOLIC PANEL
ALT: 27
AST: 37
Alkaline Phosphatase: 272 — ABNORMAL HIGH
CO2: 20
Calcium: 8.6
GFR calc Af Amer: 43 — ABNORMAL LOW
Glucose, Bld: 392 — ABNORMAL HIGH
Potassium: 4.2
Sodium: 128 — ABNORMAL LOW
Total Protein: 6.6

## 2011-01-18 LAB — POCT I-STAT, CHEM 8
BUN: 25 — ABNORMAL HIGH
Creatinine, Ser: 1.6 — ABNORMAL HIGH
Hemoglobin: 13.6
Potassium: 3.8
Sodium: 125 — ABNORMAL LOW

## 2011-01-18 LAB — CULTURE, ROUTINE-ABSCESS

## 2011-01-18 LAB — URINALYSIS, ROUTINE W REFLEX MICROSCOPIC
Glucose, UA: >1000 — AB
Specific Gravity, Urine: 1.028
Urobilinogen, UA: 2 — ABNORMAL HIGH
pH: 5.5

## 2011-01-18 LAB — BASIC METABOLIC PANEL
BUN: 14
BUN: 16
BUN: 17
BUN: 19
BUN: 20
BUN: 4 — ABNORMAL LOW
BUN: 6
CO2: 19
CO2: 19
CO2: 20
CO2: 20
CO2: 21
CO2: 29
Calcium: 9.1
Chloride: 102
Chloride: 103
Chloride: 103
Chloride: 103
Chloride: 104
Chloride: 105
Chloride: 106
Chloride: 108
Creatinine, Ser: 0.59
Creatinine, Ser: 0.62
Creatinine, Ser: 0.7
Creatinine, Ser: 0.76
Creatinine, Ser: 0.93
GFR calc Af Amer: >60
GFR calc Af Amer: >60
GFR calc Af Amer: >60
GFR calc non Af Amer: 52 — ABNORMAL LOW
GFR calc non Af Amer: >60
GFR calc non Af Amer: >60
GFR calc non Af Amer: >60
GFR calc non Af Amer: >60
Glucose, Bld: 119 — ABNORMAL HIGH
Glucose, Bld: 134 — ABNORMAL HIGH
Glucose, Bld: 139 — ABNORMAL HIGH
Glucose, Bld: 150 — ABNORMAL HIGH
Glucose, Bld: 162 — ABNORMAL HIGH
Glucose, Bld: 210 — ABNORMAL HIGH
Glucose, Bld: 85
Potassium: 3.7
Potassium: 3.7
Potassium: 4.1
Potassium: 4.1
Potassium: 4.2
Potassium: 4.3
Potassium: 4.4
Sodium: 132 — ABNORMAL LOW
Sodium: 133 — ABNORMAL LOW
Sodium: 133 — ABNORMAL LOW
Sodium: 134 — ABNORMAL LOW
Sodium: 140

## 2011-01-18 LAB — URINE MICROSCOPIC-ADD ON

## 2011-01-18 LAB — DIFFERENTIAL
Basophils Relative: 0
Eosinophils Absolute: 0
Eosinophils Absolute: 0.3
Lymphocytes Relative: 27
Lymphs Abs: 2.3
Lymphs Abs: 2.6
Monocytes Absolute: 0.7
Neutrophils Relative %: 63
Neutrophils Relative %: 87 — ABNORMAL HIGH

## 2011-01-18 LAB — ANAEROBIC CULTURE

## 2011-01-18 LAB — CULTURE, BLOOD (ROUTINE X 2): Culture: NO GROWTH

## 2011-01-18 LAB — HEMOGLOBIN A1C

## 2011-01-18 LAB — VANCOMYCIN, TROUGH: Vancomycin Tr: 11.8

## 2011-01-18 LAB — URINE CULTURE

## 2011-01-24 LAB — GLUCOSE, CAPILLARY
Glucose-Capillary: 106 — ABNORMAL HIGH
Glucose-Capillary: 111 — ABNORMAL HIGH
Glucose-Capillary: 126 — ABNORMAL HIGH

## 2011-01-30 ENCOUNTER — Ambulatory Visit: Payer: Self-pay

## 2011-02-03 ENCOUNTER — Ambulatory Visit (INDEPENDENT_AMBULATORY_CARE_PROVIDER_SITE_OTHER): Payer: Self-pay | Admitting: *Deleted

## 2011-02-03 DIAGNOSIS — Z23 Encounter for immunization: Secondary | ICD-10-CM

## 2011-02-09 LAB — CULTURE, ROUTINE-ABSCESS: Gram Stain: NONE SEEN

## 2011-03-10 ENCOUNTER — Other Ambulatory Visit: Payer: Self-pay | Admitting: Internal Medicine

## 2011-03-10 NOTE — Telephone Encounter (Signed)
Ambien rx faxed to [place].

## 2011-03-13 ENCOUNTER — Telehealth: Payer: Self-pay | Admitting: *Deleted

## 2011-03-13 NOTE — Telephone Encounter (Signed)
Pharmacy would like to change the Relion Humulin 70/30 to Relion Novolin 70/30.  Sander Nephew, RN 03/13/2011 3:13 PM # 10.  Inject 10 units into the skin 2 times daily before meals.

## 2011-03-14 MED ORDER — INSULIN NPH ISOPHANE & REGULAR (70-30) 100 UNIT/ML ~~LOC~~ SUSP: 10.0000 [IU] | Freq: Two times a day (BID) | SUBCUTANEOUS | Status: DC

## 2011-03-14 NOTE — Telephone Encounter (Signed)
Done, thanks

## 2011-07-07 ENCOUNTER — Encounter: Payer: Self-pay | Admitting: Internal Medicine

## 2011-07-07 ENCOUNTER — Ambulatory Visit (INDEPENDENT_AMBULATORY_CARE_PROVIDER_SITE_OTHER): Payer: Medicare Other | Admitting: Internal Medicine

## 2011-07-07 VITALS — BP 165/98 | HR 90 | Temp 97.0°F | Ht 65.0 in | Wt 189.7 lb

## 2011-07-07 DIAGNOSIS — M792 Neuralgia and neuritis, unspecified: Secondary | ICD-10-CM

## 2011-07-07 DIAGNOSIS — G4700 Insomnia, unspecified: Secondary | ICD-10-CM

## 2011-07-07 DIAGNOSIS — I10 Essential (primary) hypertension: Secondary | ICD-10-CM

## 2011-07-07 DIAGNOSIS — K219 Gastro-esophageal reflux disease without esophagitis: Secondary | ICD-10-CM

## 2011-07-07 DIAGNOSIS — F329 Major depressive disorder, single episode, unspecified: Secondary | ICD-10-CM

## 2011-07-07 DIAGNOSIS — Z79899 Other long term (current) drug therapy: Secondary | ICD-10-CM

## 2011-07-07 DIAGNOSIS — E119 Type 2 diabetes mellitus without complications: Secondary | ICD-10-CM

## 2011-07-07 DIAGNOSIS — E785 Hyperlipidemia, unspecified: Secondary | ICD-10-CM

## 2011-07-07 LAB — GLUCOSE, CAPILLARY: Glucose-Capillary: 522 mg/dL — ABNORMAL HIGH (ref 70–99)

## 2011-07-07 MED ORDER — INSULIN NPH ISOPHANE & REGULAR (70-30) 100 UNIT/ML ~~LOC~~ SUSP: 10.0000 [IU] | Freq: Two times a day (BID) | SUBCUTANEOUS | Status: DC

## 2011-07-07 MED ORDER — BL LANCETS THIN MISC: 1.0000 | Freq: Two times a day (BID) | Status: DC

## 2011-07-07 MED ORDER — OMEPRAZOLE 40 MG PO CPDR: 40.0000 mg | DELAYED_RELEASE_CAPSULE | Freq: Every day | ORAL | Status: DC

## 2011-07-07 MED ORDER — GABAPENTIN 300 MG PO CAPS: 300.0000 mg | ORAL_CAPSULE | Freq: Three times a day (TID) | ORAL | Status: DC

## 2011-07-07 MED ORDER — ZOLPIDEM TARTRATE 5 MG PO TABS: 5.0000 mg | ORAL_TABLET | Freq: Every evening | ORAL | Status: DC | PRN

## 2011-07-07 MED ORDER — LISINOPRIL 40 MG PO TABS: 40.0000 mg | ORAL_TABLET | Freq: Every day | ORAL | Status: DC

## 2011-07-07 MED ORDER — PRAVASTATIN SODIUM 40 MG PO TABS: 40.0000 mg | ORAL_TABLET | Freq: Every day | ORAL | Status: DC

## 2011-07-07 MED ORDER — FLUOCINONIDE 0.05 % EX CREA: TOPICAL_CREAM | Freq: Two times a day (BID) | CUTANEOUS | Status: DC

## 2011-07-07 MED ORDER — FLUOXETINE HCL 20 MG PO CAPS: 40.0000 mg | ORAL_CAPSULE | Freq: Every day | ORAL | Status: DC

## 2011-07-07 MED ORDER — TRAMADOL HCL 50 MG PO TABS: 50.0000 mg | ORAL_TABLET | Freq: Four times a day (QID) | ORAL | Status: DC | PRN

## 2011-07-07 MED ORDER — HYDROCHLOROTHIAZIDE 25 MG PO TABS: 25.0000 mg | ORAL_TABLET | Freq: Every day | ORAL | Status: DC

## 2011-07-07 MED ORDER — METFORMIN HCL 1000 MG PO TABS: 1000.0000 mg | ORAL_TABLET | Freq: Two times a day (BID) | ORAL | Status: DC

## 2011-07-07 NOTE — Patient Instructions (Signed)
You were seen today for a follow up visit for your diabetes. We would like you to start taking some of your medicines again. Please start taking metformin, hydrochlorothiazide (HCTZ), lisinopril, and prozac. You can also use the ambien for sleep. If you have any questions or problems please call our clinic at [phone]. We will see you back in May with your doctor, Dr. Doug Sou.

## 2011-07-15 NOTE — Assessment & Plan Note (Signed)
Will recheck lipids at future visit. Patient is currently not taking anything for her lipids. Have decided that this can wait as it is more important for her to resume blood pressure and diabetes medications.

## 2011-07-15 NOTE — Assessment & Plan Note (Signed)
Patient was previously well controlled on her regimen of insulin 70/30 and metformin however stopped taking her medications last December and her hemoglobin A1c at today's visit has rebounded up to 11.2 from approximately 6. She is aware that she should go back on her medications however does not wish to resume all them at once. I stressed the importance of taking her metformin. She agrees to take the metformin and come back and see me in May. Will likely need insulin 70/30 again at some point.

## 2011-07-15 NOTE — Assessment & Plan Note (Signed)
Will resume taking Ambien. This does seem to help her fairly well.

## 2011-07-15 NOTE — Assessment & Plan Note (Signed)
Well-controlled at last visit however due to patient not taking her medication since December is currently uncontrolled. Have advised her to start taking hydrochlorothiazide and lisinopril again. Will recheck at next visit in may.

## 2011-07-15 NOTE — Progress Notes (Signed)
Subjective:     Patient ID: [name], female   DOB: 05/14/1945, 66 y.o.   MRN: [id number]  HPI The patient is a 66 year old female with past medical history of depression, GERD, diabetes, headache, insomnia. She presents for a followup visit from August. At that time she was doing very well with her medical comorbidities and her diabetes is under very good control. She states that she has not taken any of her medications since last year. And has not refilled any of them since then. She states that she knows she should stop her taking her medications again however has been reluctant to do so. She is continuing to have problems falling asleep at night however when she does get to sleep she doesn't usually wake up that much. She has not had any polyuria, polydipsia, headaches, fevers, chills, chest pain, abdominal pain.   Review of Systems  Constitutional: Negative for fever, chills, activity change, appetite change and fatigue.  Respiratory: Negative for cough, chest tightness, shortness of breath, wheezing and stridor.   Cardiovascular: Negative for chest pain, palpitations and leg swelling.  Gastrointestinal: Negative for nausea, vomiting, abdominal pain, diarrhea and constipation.  Musculoskeletal: Negative.   Skin: Negative.   Neurological: Negative.   Psychiatric/Behavioral: Positive for sleep disturbance. Negative for suicidal ideas, hallucinations, behavioral problems, confusion, self-injury, dysphoric mood, decreased concentration and agitation. The patient is not nervous/anxious and is not hyperactive.        Objective:   Physical Exam  Constitutional: She is oriented to person, place, and time. She appears well-developed and well-nourished.  HENT:  Head: Normocephalic and atraumatic.  Eyes: EOM are normal. Pupils are equal, round, and reactive to light.  Cardiovascular: Normal rate and regular rhythm.   Pulmonary/Chest: Effort normal and breath sounds normal.  Abdominal: Soft.  Bowel sounds are normal. She exhibits no distension. There is no tenderness.  Musculoskeletal: Normal range of motion.  Neurological: She is alert and oriented to person, place, and time.  Skin: Skin is warm and dry.       Assessment/Plan:  1. Please see problem-oriented charting.  2. Disposition-patient be seen back in May with her PCP. I have advised her to resume her medications however stress the importance of resuming metformin, lisinopril, HCTZ, Prozac. I also given her Ambien to help with her sleep. At followup visit we will work on incorporating a couple more of her medications. I have informed her that if she has any medical problems or questions before may she should feel free to call our office and had I have given her our number.

## 2011-08-14 ENCOUNTER — Encounter: Payer: Medicare Other | Admitting: Internal Medicine

## 2011-08-14 ENCOUNTER — Encounter: Payer: Self-pay | Admitting: Internal Medicine

## 2011-08-14 ENCOUNTER — Ambulatory Visit (INDEPENDENT_AMBULATORY_CARE_PROVIDER_SITE_OTHER): Payer: Medicare Other | Admitting: Internal Medicine

## 2011-08-14 VITALS — BP 145/95 | HR 102 | Temp 97.0°F | Wt 182.4 lb

## 2011-08-14 DIAGNOSIS — Z0000 Encounter for general adult medical examination without abnormal findings: Secondary | ICD-10-CM

## 2011-08-14 DIAGNOSIS — F419 Anxiety disorder, unspecified: Secondary | ICD-10-CM

## 2011-08-14 DIAGNOSIS — E119 Type 2 diabetes mellitus without complications: Secondary | ICD-10-CM

## 2011-08-14 DIAGNOSIS — F411 Generalized anxiety disorder: Secondary | ICD-10-CM

## 2011-08-14 DIAGNOSIS — F329 Major depressive disorder, single episode, unspecified: Secondary | ICD-10-CM

## 2011-08-14 LAB — GLUCOSE, CAPILLARY: Glucose-Capillary: 365 mg/dL — ABNORMAL HIGH (ref 70–99)

## 2011-08-14 MED ORDER — ALPRAZOLAM 0.25 MG PO TABS: 0.2500 mg | ORAL_TABLET | Freq: Three times a day (TID) | ORAL | Status: DC | PRN

## 2011-08-14 MED ORDER — GLUCOSE BLOOD VI STRP: ORAL_STRIP | Status: DC

## 2011-08-14 MED ORDER — ACCU-CHEK FASTCLIX LANCETS MISC: 1.0000 | Freq: Three times a day (TID) | Status: DC

## 2011-08-14 MED ORDER — ACCU-CHEK NANO SMARTVIEW W/DEVICE KIT: 1.0000 | PACK | Freq: Three times a day (TID) | Status: DC

## 2011-08-14 MED ORDER — FLUOXETINE HCL 40 MG PO CAPS: 40.0000 mg | ORAL_CAPSULE | Freq: Every day | ORAL | Status: DC

## 2011-08-14 NOTE — Patient Instructions (Signed)
Please, pick up your new prescriptions at the pharmacy. Please, note a change in your Prozac dose : Take 40 mg One tab by mouth daily. Please, do not drive and/or operate any machinery if feeling somnolent. Please, call 911 and/or go to ED if you feel that you want to hurt either yourself or someone else! Please, call our office with any questions!!! Please, follow up with a mammogram, diabetic teaching and an eye exam. Please, follow up with [place] in 1-2 weeks or sooner if needed.

## 2011-08-15 LAB — LIPID PANEL
Cholesterol: 309 mg/dL — ABNORMAL HIGH (ref 0–200)
HDL: 40 mg/dL (ref >39)
Total CHOL/HDL Ratio: 7.7 Ratio

## 2011-08-15 NOTE — Progress Notes (Signed)
Patient ID: [name], female   DOB: 07-31-1945, 66 y.o.   MRN: [id number] HPI:    1. Depression, anxiety. Patient has a hx of depression and has ben on Prozac "for years."  Reports increase in anxiety and inability to sleep "because her  72 y/o neighbor Ms. Royce Macadamia is driving her crazy." Patient explains that although she has no any obligations to take care for her neighbor, she feels responsible "because her family does not care about her and she needs help with cooking" and other ADL's such as cleaning and washing. She denies any SI/HI or mania. She denies having an plans to hurt herself or anyone else. Review of Systems: Negative except per history of present illness  Physical Exam:  Nursing notes and vitals reviewed General:  alert, well-developed, and cooperative to examination.   Lungs:  normal respiratory effort, no accessory muscle use, normal breath sounds, no crackles, and no wheezes. Heart:  normal rate, regular rhythm, no murmurs, no gallop, and no rub.   Abdomen:  soft, non-tender, normal bowel sounds, no distention, no guarding, no rebound tenderness, no hepatomegaly, and no splenomegaly.   Extremities:  No cyanosis, clubbing, edema Neurologic:  alert & oriented X3, nonfocal exam  Meds:  (Not in a hospital admission)  Allergies: Pioglitazone and Sulfonamide derivatives Past Medical History  Diagnosis Date  . Diabetes mellitus   . Asthma     childhood  . Depression   . Anxiety   . Hypertension   . Hyperlipidemia   . 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   Marland Kitchen Hx MRSA infection 2009  . Dysphagia   . Personal history of colonic polyps 10/17/2010    hyperplastic  . Barrett's esophagus   . Hiatal hernia    Past Surgical History  Procedure Date  . Abdominal hysterectomy     unclear when   Family History  Problem Relation Age of Onset  . Colon cancer Neg Hx    History   Social History    . Marital Status: Divorced    Spouse Name: N/A    Number of Children: 0  . Years of Education: N/A   Occupational History  . Retired    Social History Main Topics  . Smoking status: Current Everyday Smoker -- 0.2 packs/day for 40 years    Types: Cigarettes  . Smokeless tobacco: Never Used   Comment: 1 pack/ week  . Alcohol Use: 1.0 oz/week    2 drink(s) per week     2 drinks on w/e  . Drug Use: No  . Sexually Active: Not on file   Other Topics Concern  . Not on file   Social History Narrative   Financial assistance approved for 100% discount at [facility] and has [facility] card per [place]10/20/20113 caffeine drinks daily     A/P: 1. Depression/ anxiety due to long-term care take fatigue/burnout. -Denies SI/HI or mania instructed the patient to discuss the needs of Ms. Foster with her family as the patient does not have any legal/moral responsibility to take care for Ms. Royce Macadamia (her neighbor). -referred to SW Ms. Jeralyn Ruths. I will also discuss the matter with Ms. Jeralyn Ruths -> neglect of a senior citizen? Will file a report with a social services. -increase Prozac to 40 mg PO daily -start Xanax; cautioned of sedation -instructed to call 911 and/or go to ED if feels worse -f/u in 1 week or sooner. -

## 2011-08-17 ENCOUNTER — Telehealth: Payer: Self-pay | Admitting: Licensed Clinical Social Worker

## 2011-08-17 NOTE — Telephone Encounter (Signed)
[name] is a 66 y.o. 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  [name] was in [facility] on 4/22 and voiced increased stress with being the primary caregiver to her elderly neighbor that is blind and on dialysis, 3 times a week. CSW called pt and had lengthy telephone conversation offering support and encouragement.  CSW allowed pt to express frustration regarding care giving and provided pt with community resources to utilize for her neighbor.  CSW placed information on caregiver support groups, caregiving resources, [employer], as well as resources for the neighbor (PACE, ACE, P4CC).  CSW placed call to Adult Protective Services to determine if case worker could link neighbor with appropriate care and resources.  Pt denies add'l needs at this time.  Pt aware of CSW contact information and availability to assist as needed.

## 2011-08-21 NOTE — Progress Notes (Signed)
Addended by: Marcelino Duster on: 08/21/2011 08:53 AM   Modules accepted: Orders

## 2011-08-29 ENCOUNTER — Telehealth: Payer: Self-pay | Admitting: *Deleted

## 2011-08-29 NOTE — Telephone Encounter (Signed)
Home visiting nurse called and left message - she sees pt yearly. Pt to be seen soon in clinic - wanted clinic to be aware pt has new meter for CBG and does not know how to use and detected 2+ protein in urine. Sch shows appt 09/08/11 with Dr Doug Sou. Hilda Blades Arick Mareno RN 08/29/11 4:35PM

## 2011-09-08 ENCOUNTER — Ambulatory Visit (INDEPENDENT_AMBULATORY_CARE_PROVIDER_SITE_OTHER): Payer: Medicare Other | Admitting: Internal Medicine

## 2011-09-08 ENCOUNTER — Encounter: Payer: Self-pay | Admitting: Internal Medicine

## 2011-09-08 VITALS — BP 137/95 | HR 94 | Temp 97.2°F | Ht 65.0 in | Wt 184.2 lb

## 2011-09-08 DIAGNOSIS — I10 Essential (primary) hypertension: Secondary | ICD-10-CM

## 2011-09-08 DIAGNOSIS — G4700 Insomnia, unspecified: Secondary | ICD-10-CM

## 2011-09-08 DIAGNOSIS — F419 Anxiety disorder, unspecified: Secondary | ICD-10-CM

## 2011-09-08 DIAGNOSIS — E119 Type 2 diabetes mellitus without complications: Secondary | ICD-10-CM

## 2011-09-08 DIAGNOSIS — F329 Major depressive disorder, single episode, unspecified: Secondary | ICD-10-CM

## 2011-09-08 DIAGNOSIS — F3289 Other specified depressive episodes: Secondary | ICD-10-CM

## 2011-09-08 MED ORDER — ZOLPIDEM TARTRATE 5 MG PO TABS: 5.0000 mg | ORAL_TABLET | Freq: Every evening | ORAL | Status: DC | PRN

## 2011-09-08 MED ORDER — ALPRAZOLAM 0.25 MG PO TABS: 0.2500 mg | ORAL_TABLET | Freq: Three times a day (TID) | ORAL | Status: DC | PRN

## 2011-09-08 NOTE — Assessment & Plan Note (Signed)
Patient's depression has been exacerbated by recent stressors in her life. She states that she occasionally gets very angry. She would like to do some counseling and she feels like this would help with her stress management. I did strongly encourage this and had her talk to our social worker prior to departing the clinic. She is taking Prozac which she was off of for some time and now is back on an appropriate dose she thinks it is helping her mood. We'll not make change the dose at today's visit. Did refill her Xanax and Ambien today she states that she wakes up her liver she is usually able to get to sleep with the Ambien. She does not use it daily. The Xanax has not used daily. She states uses it when she is having very stressful days. We'll see her back in one month and see how things are going.

## 2011-09-08 NOTE — Progress Notes (Signed)
Subjective:     Patient ID: [name], female   DOB: [birth year], 66 y.o.   MRN: [id number]  HPI The patient is a 66 year old female who comes in today for a followup of her hypertension, depression, diabetes. She did stop taking her medications around December and did not resume taking them until mid to late March. Her hemoglobin A1c had risen to about 11 at that time. She has since continued taking metformin. Did strongly talk to her about seeing our diabetic educator. She did seem to agree to this and has appointment for later this month. She in the past did take insulin and would like to possibly get back to that however she does wish to use a pen instead of a vial and syringe. Did advise her that our diabetic educator was very good at deciding which pens were covered by which insurances. Her blood pressure today was much better than on previous visits. Some of the stressors in her life have diminished. She did have a good talk with our social worker who is going to advise her of some possible counseling needs.  Review of Systems  Constitutional: Negative for fever, chills, diaphoresis, activity change, appetite change, fatigue and unexpected weight change.  HENT: Negative.   Eyes: Negative.   Respiratory: Negative for cough, choking, chest tightness, shortness of breath and stridor.   Cardiovascular: Negative for chest pain, palpitations and leg swelling.  Gastrointestinal: Negative for nausea, vomiting, abdominal pain, diarrhea and constipation.  Musculoskeletal: Negative.   Skin: Negative.   Neurological: Negative for dizziness, tremors, seizures, syncope, facial asymmetry, speech difficulty, weakness, light-headedness, numbness and headaches.  Psychiatric/Behavioral: Positive for dysphoric mood. The patient is nervous/anxious.     Vitals: But pressure: 137/95 Pulse: 94 Temperature: 97.54F Height: 5 feet 5 inches Weight: 184 pounds    Objective:   Physical Exam  Constitutional:  She is oriented to person, place, and time. She appears well-developed and well-nourished.  HENT:  Head: Normocephalic and atraumatic.  Eyes: EOM are normal. Pupils are equal, round, and reactive to light.  Neck: Normal range of motion. Neck supple.  Cardiovascular: Normal rate and regular rhythm.   Pulmonary/Chest: Effort normal and breath sounds normal.  Abdominal: Soft. Bowel sounds are normal.  Musculoskeletal: Normal range of motion.  Neurological: She is alert and oriented to person, place, and time.  Skin: Skin is warm and dry.       Assessment/Plan:   1. Please see problem-oriented charting.  2. Disposition-patient will be seen back in one month for check of her hemoglobin [id number], basic metabolic panel, urine microalbumin to creatinine ratio. Have advised her she is feeling sickly between now and then she should feel free to call our office or if she does realize she needs more refills. Did refill her Xanax and her Ambien at today's visit.

## 2011-09-08 NOTE — Assessment & Plan Note (Signed)
Patient is currently taking metformin 1000 mg twice daily. She states that her blood sugars have been 130s however she did not bring her meter in for review today. I did strongly encourage her to bring to next visit. We'll see her back in one month so we can check her hemoglobin A1c and see how she's progressing. We'll have her see Butch Penny Plyler our diabetic educator later this month to advise on possible resumption of the insulin regimen.

## 2011-09-08 NOTE — Patient Instructions (Signed)
You were seen today for a routine check up. You are doing great with your blood pressure. CONGRATULATIONS on the weight loss. KEEP IT UP! Think about walking more outside now that the weather is getting nicer. We will see you back in 1 month to check on your diabetes. Keep taking your medicines. If you have questions or need refills please call us at [phone].

## 2011-09-08 NOTE — Assessment & Plan Note (Signed)
Blood pressure is still not at goal however we'll not make change at today's regimen. At next visit will possibly advise resumption of another blood pressure medication. However she's too overwhelmed with stress in her life right now to begin another medication.

## 2011-09-14 ENCOUNTER — Ambulatory Visit (INDEPENDENT_AMBULATORY_CARE_PROVIDER_SITE_OTHER): Payer: Medicare Other | Admitting: Dietician

## 2011-09-14 DIAGNOSIS — E119 Type 2 diabetes mellitus without complications: Secondary | ICD-10-CM

## 2011-09-14 NOTE — Progress Notes (Signed)
Medical Nutrition Therapy:  Appt start time: 1000 end time:  1030.  Assessment:  Primary concerns today: Blood sugar control and support.  Patient blood sugars very good on current diabetes medicine of 10 units Novolin 70/30 twice daily before meals and 500 mg metformin. She tolerates metformin well and does not know why she is not using a higher dose. Usual eating pattern includes 3 meals/day and unsure about  snacks. 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  Progress Towards Goal(s):  In progress.   Nutritional Diagnosis:  NB-1.3 Not ready for diet/lifestyle change  As related to her stated goal to come off insulin and not ready to discuss her excess sugar intake (regular soda and juice) .  As evidenced by and not ready to discuss her excess sugar intake (regular soda and juice) . NI-5.8.2 Excessive carbohydrate intake As related to her staed goal to be abel to control hwer blood sugars without insulin.  As evidenced by her reported 2 cans of regular soda and juice consumption daily.    Intervention:  Nutrition intake and blood sugar sin 3 months..  Monitoring/Evaluation:  Dietary intake, exercise, reassess readiness to change, and body weight in 3 month(s).

## 2011-09-14 NOTE — Patient Instructions (Signed)
Please make a follow up in 3 months to talk about where you are with your goal to get off the insulin and still control your blood sugars.  I will ask the social worker, Golden Hurter, to give you a call.   You are doing a wonderful taking care of your diabetes right now!!  When things get tough and you are having a hard time me caring for your diabetes, you are welcome to call me!  Butch Penny [phone].

## 2011-09-15 ENCOUNTER — Telehealth: Payer: Self-pay | Admitting: Licensed Clinical Social Worker

## 2011-09-15 ENCOUNTER — Other Ambulatory Visit: Payer: Self-pay | Admitting: Internal Medicine

## 2011-09-15 MED ORDER — INSULIN ASPART PROT & ASPART (70-30 MIX) 100 UNIT/ML ~~LOC~~ SUSP: 10.0000 [IU] | Freq: Two times a day (BID) | SUBCUTANEOUS | Status: DC

## 2011-09-15 NOTE — Telephone Encounter (Signed)
Pt referred to CSW by Diabetes Educator as pt requesting assistance with counseling referrals.  CSW placed called to pt.  CSW left message requesting return call. CSW provided contact hours and phone number.  Pt has [place] Medicare, will refer to [facility]

## 2011-09-20 ENCOUNTER — Other Ambulatory Visit: Payer: Self-pay | Admitting: *Deleted

## 2011-09-20 DIAGNOSIS — E119 Type 2 diabetes mellitus without complications: Secondary | ICD-10-CM

## 2011-09-20 NOTE — Telephone Encounter (Signed)
CSW placed called to pt.  CSW left message requesting return call. CSW provided contact hours and phone number. 

## 2011-09-21 MED ORDER — GLUCOSE BLOOD VI STRP: ORAL_STRIP | Status: DC

## 2011-09-22 NOTE — Telephone Encounter (Signed)
"  I'm doing better than I was".  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  [name] states she is doing better and declines the need for any resources at this time.  Pt aware CSW is available to assist as needed and denies add'l needs at this time.

## 2011-09-25 ENCOUNTER — Ambulatory Visit (HOSPITAL_COMMUNITY)
Admission: RE | Admit: 2011-09-25 | Discharge: 2011-09-25 | Disposition: A | Discharge status: Home / Self Care | Payer: Medicare Other | Source: Ambulatory Visit | Attending: Internal Medicine | Admitting: Internal Medicine

## 2011-09-25 ENCOUNTER — Ambulatory Visit (INDEPENDENT_AMBULATORY_CARE_PROVIDER_SITE_OTHER): Payer: Medicare Other | Admitting: Internal Medicine

## 2011-09-25 ENCOUNTER — Other Ambulatory Visit: Payer: Self-pay | Admitting: Internal Medicine

## 2011-09-25 VITALS — BP 138/90 | HR 82 | Temp 96.5°F | Resp 20 | Ht 64.0 in | Wt 187.9 lb

## 2011-09-25 DIAGNOSIS — R0781 Pleurodynia: Secondary | ICD-10-CM

## 2011-09-25 DIAGNOSIS — E119 Type 2 diabetes mellitus without complications: Secondary | ICD-10-CM

## 2011-09-25 DIAGNOSIS — W19XXXA Unspecified fall, initial encounter: Secondary | ICD-10-CM

## 2011-09-25 DIAGNOSIS — R079 Chest pain, unspecified: Secondary | ICD-10-CM | POA: Insufficient documentation

## 2011-09-25 DIAGNOSIS — K449 Diaphragmatic hernia without obstruction or gangrene: Secondary | ICD-10-CM | POA: Insufficient documentation

## 2011-09-25 DIAGNOSIS — R0789 Other chest pain: Secondary | ICD-10-CM | POA: Insufficient documentation

## 2011-09-25 LAB — GLUCOSE, CAPILLARY: Glucose-Capillary: 82 mg/dL (ref 70–99)

## 2011-09-25 MED ORDER — HYDROCODONE-ACETAMINOPHEN 5-500 MG PO TABS: 1.0000 | ORAL_TABLET | Freq: Four times a day (QID) | ORAL | Status: DC | PRN

## 2011-09-25 MED ORDER — GLUCOSE BLOOD VI STRP: ORAL_STRIP | Status: DC

## 2011-09-25 NOTE — Progress Notes (Signed)
Addended by: Jenelle Mages on: 09/25/2011 01:58 PM   Modules accepted: Orders

## 2011-09-25 NOTE — Progress Notes (Signed)
Subjective:     Patient ID: [name], female   DOB: 10-Oct-1945, 66 y.o.   MRN: [id number]  HPI Patient is a very pleasant 66 year old woman with a history of hypertension, depression, diabetes who presents after a fall.  Yesterday the patient was watering her plants and walking back on her brick walk way when she lost her footing and fell to the ground. No head trauma, no loss of consciousness. She landed on her right side and has since had sharp stabbing pain at the right sternal border and under the right breast. It is worse with inspiration or cough. She does not have difficulty breathing. She tried taking Aleve x3 with no relief. The pain is worse with movement. She is diabetic but does not think her CBG was related to the fall, and she did not have any symptoms of hypoglycemia at that time.  Review of Systems No chest pain, no palpitations, no shortness of breath.    Objective:   Physical Exam Gen: NAD Chest: +TTP over xyphoid and along ribs under R breast.  Difficult to localize 2/2 body habitus.  Clear breath sounds b/l, good air movement.  No distress.    Assessment:         Plan:

## 2011-09-25 NOTE — Assessment & Plan Note (Signed)
Patient presents status post fall yesterday. Differential includes bruising versus rib fracture. Good breath sounds and no dyspnea, very low concern for pneumothorax. Minimal relief with Aleve. - Right sided rib series - Vicodin 5/500 quantity 20 - Continue Aleve for anti-inflammatory properties - Will contact patient with x-ray results - RTC as scheduled or PRN if no relief

## 2011-09-28 ENCOUNTER — Telehealth: Payer: Self-pay | Admitting: *Deleted

## 2011-09-28 NOTE — Telephone Encounter (Signed)
Pt called right chest area from fall - no change. Talked with Dr Dorthula Nettles - CXR were normal. Cont with pain med and Aleve which she is doing. Alternate with ice and warm compresses to area. May take 3 days to 2 weeks with soreness. To call if any changes. Hilda Blades Mikenzie Mccannon RN 09/28/11 9AM

## 2011-10-20 ENCOUNTER — Encounter: Payer: Self-pay | Admitting: Internal Medicine

## 2011-10-20 ENCOUNTER — Ambulatory Visit (INDEPENDENT_AMBULATORY_CARE_PROVIDER_SITE_OTHER): Payer: Medicare Other | Admitting: Internal Medicine

## 2011-10-20 VITALS — BP 152/97 | HR 98 | Temp 97.8°F | Ht 64.0 in | Wt 188.8 lb

## 2011-10-20 DIAGNOSIS — I10 Essential (primary) hypertension: Secondary | ICD-10-CM

## 2011-10-20 DIAGNOSIS — E119 Type 2 diabetes mellitus without complications: Secondary | ICD-10-CM

## 2011-10-20 DIAGNOSIS — Z79899 Other long term (current) drug therapy: Secondary | ICD-10-CM

## 2011-10-20 DIAGNOSIS — E785 Hyperlipidemia, unspecified: Secondary | ICD-10-CM

## 2011-10-20 DIAGNOSIS — F419 Anxiety disorder, unspecified: Secondary | ICD-10-CM

## 2011-10-20 DIAGNOSIS — R0781 Pleurodynia: Secondary | ICD-10-CM

## 2011-10-20 DIAGNOSIS — R079 Chest pain, unspecified: Secondary | ICD-10-CM

## 2011-10-20 LAB — GLUCOSE, CAPILLARY: Glucose-Capillary: 100 mg/dL — ABNORMAL HIGH (ref 70–99)

## 2011-10-20 LAB — POCT GLYCOSYLATED HEMOGLOBIN (HGB A1C): Hemoglobin A1C: 7.1

## 2011-10-20 MED ORDER — ALPRAZOLAM 0.5 MG PO TABS: 0.5000 mg | ORAL_TABLET | Freq: Three times a day (TID) | ORAL | Status: DC | PRN

## 2011-10-20 NOTE — Patient Instructions (Signed)
You were seen for back/side pain. We will give you some stretching exercises to work out the muscles. You are doing better with your diabetes. Great job! Continue to take your medicines and come back in 6 weeks to check on your blood pressure. If you have any questions please feel free to call our clinic at [phone].  Back Exercises Back exercises help treat and prevent back injuries. The goal is to increase your strength in your belly (abdominal) and back muscles. These exercises can also help with flexibility. Start these exercises when told by your doctor. HOME CARE Back exercises include: Pelvic Tilt.  Lie on your back with your knees bent. Tilt your pelvis until the lower part of your back is against the floor. Hold this position 5 to 10 sec. Repeat this exercise 5 to 10 times.  Knee to Chest.  Pull 1 knee up against your chest and hold for 20 to 30 seconds. Repeat this with the other knee. This may be done with the other leg straight or bent, whichever feels better. Then, pull both knees up against your chest.  Sit-Ups or Curl-Ups.  Bend your knees 90 degrees. Start with tilting your pelvis, and do a partial, slow sit-up. Only lift your upper half 30 to 45 degrees off the floor. Take at least 2 to 3 seonds for each sit-up. Do not do sit-ups with your knees out straight. If partial sit-ups are difficult, simply do the above but with only tightening your belly (abdominal) muscles and holding it as told.  Hip-Lift.  Lie on your back with your knees flexed 90 degrees. Push down with your feet and shoulders as you raise your hips 2 inches off the floor. Hold for 10 seconds, repeat 5 to 10 times.  Back Arches.  Lie on your stomach. Prop yourself up on bent elbows. Slowly press on your hands, causing an arch in your low back. Repeat 3 to 5 times.  Shoulder-Lifts.  Lie face down with arms beside your body. Keep hips and belly pressed to floor as you slowly lift your head and shoulders off  the floor.  Do not overdo your exercises. Be careful in the beginning. Exercises may cause you some mild back discomfort. If the pain lasts for more than 15 minutes, stop the exercises until you see your doctor. Improvement with exercise for back problems is slow.  Document Released: 05/13/2010 Document Revised: 03/30/2011 Document Reviewed: 05/13/2010 [facility] Patient Information 2012 [place].

## 2011-10-24 NOTE — Assessment & Plan Note (Signed)
Advised her to continue using Tylenol over-the-counter for this pain and to continue to do the stretching exercises which said provided her to help alleviate the muscle spasm. She states she does use heat and cold alternating and advised her that that was acceptable as well. Did have x-ray done in past which did not show any fracture and I did reiterate this to her and that this pain will likely resolve over the next several weeks gradually. Advised her that if the pain worsens or is becoming severe she should call our office.

## 2011-10-24 NOTE — Assessment & Plan Note (Signed)
Continues to take statin for her hypercholesterol.

## 2011-10-24 NOTE — Progress Notes (Signed)
Subjective:     Patient ID: [name], female   DOB: [birth year], 66 y.o.   MRN: [id number]  HPI The patient is a 66 year old woman who comes in today for a followup of her diabetes, hypertension. She is also having rib pain on her right side. She was outside several weeks ago and did trip on some loose brick. She was taking care of her flowers in her yard. She did come in for evaluation after that time and did have x-ray which was showed no fractures. She is continuing to have some muscle pain in the right flank area over the mid rib area. She is also having some lower back pain which has troubled her for some time. She states it is not worse recently and she is not having any fevers, weight loss, chills. She's not had any numbness or weakness in her legs or arms. She has not have any loss of bowel or bladder function. She states that she has been taking her medications as prescribed. She continues to take insulin 70/30. She does continue to take metformin as well. She takes lisinopril for her blood pressure and hydrochlorothiazide. She states that she is only taking one of these currently however she's not sure which one. I did advise her she probably needs to take both of them and she stated that she would try to do that in the future.she has not she's not had any hypoglycemic events. And states that most of her sugars are well within the normal range. She did bring her meter in for review today. This does confirm these findings. She is currently still smoking and does not wish to quit at this time due to stress in her life. I did advise cessation. She does need several refills at today's visit.   Review of Systems  Constitutional: Negative for fever, chills, activity change, appetite change, fatigue and unexpected weight change.  Respiratory: Negative for cough, choking, chest tightness, shortness of breath, wheezing and stridor.   Cardiovascular: Positive for chest pain. Negative for palpitations  and leg swelling.       Tender over right rib area to touch with some muscle spasm.  Musculoskeletal: Positive for myalgias and back pain. Negative for joint swelling, arthralgias and gait problem.  Skin: Negative.   Neurological: Negative for dizziness, tremors, seizures, syncope, facial asymmetry, weakness, light-headedness, numbness and headaches.    Vitals: Blood pressure: 152/97 Pulse: 98  Resp 20 Temp 97.8 F (36.6 C)  SpO2 96% on room air Weight 188 lb 12.8 oz (85.639 kg)  Height 5\' 4"  (1.626 m)      Objective:   Physical Exam  Constitutional: She is oriented to person, place, and time. She appears well-developed and well-nourished.  HENT:  Head: Normocephalic and atraumatic.  Eyes: EOM are normal. Pupils are equal, round, and reactive to light.  Neck: Normal range of motion. Neck supple.  Cardiovascular: Normal rate and regular rhythm.   Pulmonary/Chest: Effort normal and breath sounds normal. No respiratory distress. She has no wheezes. She has no rales. She exhibits tenderness.  Abdominal: Soft. Bowel sounds are normal.  Musculoskeletal: Normal range of motion. She exhibits tenderness.  Neurological: She is alert and oriented to person, place, and time.       Assessment/Plan:  2. Disposition-the patient be seen back in 6 weeks for a blood pressure check. Her hemoglobin A1c is 7.1 today and I did congratulate her on the success. I did encourage her to continue taking her medications as  the last time she did stop this did go up to 11.lan:   1. Please see problem-oriented charting.  2. Disposition-patient be seen back in 6 weeks for a blood pressure check as well as following up on her diabetes. She was strongly encouraged to continue taking all her medications for her diabetes as this has produced a good outcome of her hemoglobin A1c. She is also advised to to start taking both of her blood pressure medications instead of just one. Did advise her that if she has any  problems or questions before next visit to feel free to call our office. Did provide refills for several her medications at today's visit. Did give some stretching exercises for her back.

## 2011-10-24 NOTE — Assessment & Plan Note (Signed)
The patient is supposed to be taking hydrochlorothiazide and lisinopril however states she is only taking one of her to medications. She is unable to remember at today's visit which one she is taking. Her blood pressure is slightly high at today's visit and I did encourage her to take both blood pressure medications in the future. We'll see her back in 6 weeks for a blood pressure recheck.

## 2011-10-24 NOTE — Assessment & Plan Note (Signed)
The patient continues to take 10 units of insulin 70/30 twice daily with meals. She is also taking lisinopril, metformin, pravastatin. Her diabetes is much better controlled at today's visit with hemoglobin A1c of 7.1. Did advise her that she should not stop taking her medications in the future as she has done in the past. We will see her back in 6 weeks. No hypoglycemic events. Most of her sugars are within the acceptable range on her meter at today's visit. Did encourage her to bring her meter to every visit.

## 2011-11-09 ENCOUNTER — Other Ambulatory Visit: Payer: Self-pay | Admitting: *Deleted

## 2011-11-09 DIAGNOSIS — I10 Essential (primary) hypertension: Secondary | ICD-10-CM

## 2011-11-09 DIAGNOSIS — F419 Anxiety disorder, unspecified: Secondary | ICD-10-CM

## 2011-11-09 MED ORDER — ASPIRIN EC 81 MG PO TBEC: 81.0000 mg | DELAYED_RELEASE_TABLET | Freq: Every day | ORAL | Status: DC

## 2011-12-01 ENCOUNTER — Ambulatory Visit (INDEPENDENT_AMBULATORY_CARE_PROVIDER_SITE_OTHER): Payer: Medicare Other | Admitting: Internal Medicine

## 2011-12-01 ENCOUNTER — Encounter: Payer: Self-pay | Admitting: Internal Medicine

## 2011-12-01 VITALS — BP 140/80 | HR 70 | Temp 97.7°F | Ht 64.0 in | Wt 193.3 lb

## 2011-12-01 DIAGNOSIS — F329 Major depressive disorder, single episode, unspecified: Secondary | ICD-10-CM

## 2011-12-01 DIAGNOSIS — E1142 Type 2 diabetes mellitus with diabetic polyneuropathy: Secondary | ICD-10-CM

## 2011-12-01 DIAGNOSIS — E1149 Type 2 diabetes mellitus with other diabetic neurological complication: Secondary | ICD-10-CM

## 2011-12-01 DIAGNOSIS — G4700 Insomnia, unspecified: Secondary | ICD-10-CM

## 2011-12-01 LAB — GLUCOSE, CAPILLARY
Glucose-Capillary: 57 mg/dL — ABNORMAL LOW (ref 70–99)
Glucose-Capillary: 50 mg/dL — ABNORMAL LOW (ref 70–99)

## 2011-12-01 MED ORDER — ZOLPIDEM TARTRATE 5 MG PO TABS: 5.0000 mg | ORAL_TABLET | Freq: Every evening | ORAL | Status: DC | PRN

## 2011-12-01 NOTE — Assessment & Plan Note (Signed)
Feel that patient is having some problems with some recent changes in her life and would benefit from counseling at this time. Did give resources regarding suicide prevention and counseling options. Advised her to continue taking her prozac as she had not taken dose today yet.

## 2011-12-01 NOTE — Assessment & Plan Note (Signed)
Continue current regimen of 10 units of 70/30 BID with metformin. Is on ACE-I

## 2011-12-01 NOTE — Progress Notes (Signed)
Subjective:     Patient ID: [name], female   DOB: 16-May-1945, 66 y.o.   MRN: [id number]  HPI The patient is a 66 year old woman who comes in today for a followup visit of her chronic medical problems. She is having a lot of stress in her life lately and claims that her mother is starting to develop Alzheimer's, her neighbor who is on dialysis is currently in the hospital and she is going to visit her later in this day. She states she hasn't been unable to get seizures as recently. Her sugars have not been well at home. She states low sugar in the morning is 103. No hypoglycemic events. She states that no chest pain no shortness of breath. The rib pain she was having several months ago is now resolved fully. She states that she sometimes thinks that life might be better if she was away somewhere. She states that she is occasionally vaguely suicidal thoughts however no plan no action and she states that "she would not do that". At this point in our conversation counseled her that if she is ever having suicidal thoughts or actual plan of suicide or increasing frequency of these vague thoughts that she should call our office and we are open 24/7 advise and information.  Review of Systems  Constitutional: Negative for fever, chills, diaphoresis, activity change, appetite change, fatigue and unexpected weight change.  Respiratory: Negative.  Negative for chest tightness.   Cardiovascular: Negative.  Negative for chest pain.  Gastrointestinal: Negative.   Musculoskeletal: Negative.   Neurological: Negative for dizziness, tremors, seizures, syncope, facial asymmetry, speech difficulty, weakness, light-headedness, numbness and headaches.  Psychiatric/Behavioral: Positive for disturbed wake/sleep cycle, dysphoric mood and decreased concentration. Negative for suicidal ideas, hallucinations, behavioral problems, confusion, self-injury and agitation. The patient is not nervous/anxious and is not  hyperactive.        Objective:   Physical Exam  Constitutional: She is oriented to person, place, and time. She appears well-developed and well-nourished.  HENT:  Head: Normocephalic and atraumatic.  Eyes: EOM are normal. Pupils are equal, round, and reactive to light.  Neck: Normal range of motion. Neck supple.  Cardiovascular: Normal rate and regular rhythm.   Pulmonary/Chest: Effort normal and breath sounds normal.  Abdominal: Soft. Bowel sounds are normal.  Musculoskeletal: Normal range of motion.  Neurological: She is alert and oriented to person, place, and time.  Skin: Skin is warm and dry.       Assessment:   1. Please see problem-oriented charting.  2. Disposition-patient will be seen back in 6 weeks for close followup. She was given information about [facility] counseling service and strongly advised to please go and see them. She was given refill her Ambien at today's visit and given information regarding suicide prevention. She is not currently suicidal states that she has had these thoughts in the past although not serious thoughts and no plan. She is agreeable to calling our office at any time or using the outlined references in the material given to her.

## 2011-12-01 NOTE — Patient Instructions (Addendum)
You were seen today for a follow up today. You are doing good on your diabetes and blood pressure. We will refill your Lorrin Mais and would like you to go get some counseling to help deal with the changes going on in your life right now. Our number is [phone] and don't hesitate to call if you need [place]. We will see you back in about 6 weeks.    Suicidal Feelings, How to Help Yourself Everyone feels sad or unhappy at times, but depressing thoughts and feelings of hopelessness can lead to thoughts of suicide. It can seem as if life is too tough to handle. It is as if the mountain is just too high and your climbing skills are not great enough. At that moment these dark thoughts and feelings may seem overwhelming and never ending. It is important to remember these feelings are temporary! They will go away. If you feel as though you have reached the point where suicide is the only answer, it is time to let someone know immediately. This is the first step to feeling better. The following steps will move you to safer ground and lead you in a positive direction out of depression. HOW TO COPE AND PREVENT SUICIDE  Let family, friends, teachers and/or counselors know. Get help. Try not to isolate yourself from those who care about you. Even though you may not feel sociable or think that you are not good company, talk with someone everyday. It is best if it is face to face. Remember, they will want to help you.   Eat a regularly spaced and well-balanced diet, and get plenty of rest.   Avoid alcohol and drugs because they will only make you feel worse and may also lower your inhibitions. Remove them from the home. If you are thinking of taking an overdose of your prescribed medications, give your medicines to someone who can give them to you one day at a time. If you are on antidepressants, let your caregiver know of your feelings so he or she can provide a safer medication, if that is a concern.   Remove weapons or  poisons from your home.   Try to stick to routines. That may mean just walking the dog or feeding the cat. Follow a schedule and remind yourself that you have to keep that schedule every day. Play with your pets. If it is possible, and you do not have a pet, get one. They give you a sense of well-being, lower your blood pressure and make your heart feel good. They need you, and we all want to be needed.   Set some realistic goals and achieve them. Make a list and cross things off as you go. Accomplishments give a sense of worth. Wait until you are feeling better before doing things you find difficult or unpleasant to do.   If you are able, try to start exercising. Even half-hour periods of exercise each day will make you feel better. Getting out in the sun or into nature helps you recover from depression faster. If you have a favorite place to walk, take advantage of that.   Increase safe activities that have always given you pleasure. This may include playing your favorite music, reading a good book, painting a picture or playing your favorite instrument. Do whatever takes your mind off your depression and puts a smile on your face.   Keep your living space well lit with windows open, and let the sun shine in. Bright light definitely  treats depression, not just people with the seasonal affective disorders (SAD).  Above all else remember, depression is temporary. It will go away. Do not contemplate suicide. Death as a permanent solution is not the answer. Suicide will take away the beautiful rest of life, and do lifelong harm to those around you who love you. Help is available. National Suicide Help Lines with 24 hour help are: 1-800-SUICIDE (442)412-3119 Document Released: 10/15/2002 Document Revised: 03/30/2011 Document Reviewed: 03/05/2007 [facility] Patient Information 2012 [place], [place].

## 2012-01-12 ENCOUNTER — Encounter: Payer: Self-pay | Admitting: Internal Medicine

## 2012-01-12 ENCOUNTER — Ambulatory Visit (INDEPENDENT_AMBULATORY_CARE_PROVIDER_SITE_OTHER): Payer: Medicare Other | Admitting: Internal Medicine

## 2012-01-12 VITALS — BP 127/83 | HR 93 | Temp 98.0°F | Ht 65.0 in | Wt 194.3 lb

## 2012-01-12 DIAGNOSIS — E1142 Type 2 diabetes mellitus with diabetic polyneuropathy: Secondary | ICD-10-CM

## 2012-01-12 DIAGNOSIS — E1149 Type 2 diabetes mellitus with other diabetic neurological complication: Secondary | ICD-10-CM

## 2012-01-12 DIAGNOSIS — Z23 Encounter for immunization: Secondary | ICD-10-CM

## 2012-01-12 DIAGNOSIS — F419 Anxiety disorder, unspecified: Secondary | ICD-10-CM

## 2012-01-12 DIAGNOSIS — Z79899 Other long term (current) drug therapy: Secondary | ICD-10-CM

## 2012-01-12 DIAGNOSIS — G4700 Insomnia, unspecified: Secondary | ICD-10-CM

## 2012-01-12 DIAGNOSIS — I10 Essential (primary) hypertension: Secondary | ICD-10-CM

## 2012-01-12 LAB — GLUCOSE, CAPILLARY: Glucose-Capillary: 89 mg/dL (ref 70–99)

## 2012-01-12 LAB — POCT GLYCOSYLATED HEMOGLOBIN (HGB A1C): Hemoglobin A1C: 6.4

## 2012-01-12 MED ORDER — TRAMADOL HCL 50 MG PO TABS: 50.0000 mg | ORAL_TABLET | Freq: Four times a day (QID) | ORAL | Status: DC | PRN

## 2012-01-12 MED ORDER — ZOLPIDEM TARTRATE 5 MG PO TABS: 5.0000 mg | ORAL_TABLET | Freq: Every evening | ORAL | Status: DC | PRN

## 2012-01-12 MED ORDER — ALPRAZOLAM 0.5 MG PO TABS: 0.5000 mg | ORAL_TABLET | Freq: Three times a day (TID) | ORAL | Status: DC | PRN

## 2012-01-12 NOTE — Assessment & Plan Note (Signed)
The patient's blood pressure is well-controlled on hydrochlorothiazide and lisinopril at this time. No changes at today's visit.

## 2012-01-12 NOTE — Patient Instructions (Signed)
Stop taking metformin. For gabapentin take 2 pills a day for the next 3 days, then take 1 pill per day for the 3 days after that. Then stop taking gabapentin. If you start to have pain you can start taking it again. We will see you back in 3 months. We will give you a flu shot and pneumonia shot. Call us with any problems or questions. Our number is [phone].   Exercise to Lose Weight Exercise and a healthy diet may help you lose weight. Your doctor may suggest specific exercises. EXERCISE IDEAS AND TIPS  Choose low-cost things you enjoy doing, such as walking, bicycling, or exercising to workout videos.   Take stairs instead of the elevator.   Walk during your lunch break.   Park your car further away from work or school.   Go to a gym or an exercise class.   Start with 5 to 10 minutes of exercise each day. Build up to 30 minutes of exercise 4 to 6 days a week.   Wear shoes with good support and comfortable clothes.   Stretch before and after working out.   Work out until you breathe harder and your heart beats faster.   Drink extra water when you exercise.   Do not do so much that you hurt yourself, feel dizzy, or get very short of breath.  Exercises that burn about 150 calories:  Running 1  miles in 15 minutes.   Playing volleyball for 45 to 60 minutes.   Washing and waxing a car for 45 to 60 minutes.   Playing touch football for 45 minutes.   Walking 1  miles in 35 minutes.   Pushing a stroller 1  miles in 30 minutes.   Playing basketball for 30 minutes.   Raking leaves for 30 minutes.   Bicycling 5 miles in 30 minutes.   Walking 2 miles in 30 minutes.   Dancing for 30 minutes.   Shoveling snow for 15 minutes.   Swimming laps for 20 minutes.   Walking up stairs for 15 minutes.   Bicycling 4 miles in 15 minutes.   Gardening for 30 to 45 minutes.   Jumping rope for 15 minutes.   Washing windows or floors for 45 to 60 minutes.  Document  Released: 05/13/2010 Document Revised: 03/30/2011 Document Reviewed: 05/13/2010 [facility] Patient Information 2012 [place].

## 2012-01-12 NOTE — Assessment & Plan Note (Addendum)
The patient will stop taking metformin as her hemoglobin A1c was 6.4 today. She will continue to take 70/30 insulin flex pen 10 units twice a day. She continues to take ACE inhibitor and statin. Her blood pressure is controlled. She did get flu shot and pneumonia shot at today's visit. We will taper her off gabapentin as she is not getting benefit from it.

## 2012-01-12 NOTE — Assessment & Plan Note (Signed)
Did refill Ambien and Xanax at today's visit.

## 2012-01-12 NOTE — Progress Notes (Signed)
Subjective:     Patient ID: [name], female   DOB: 1945/06/14, 66 y.o.   MRN: [id number]  HPI The patient is a 66 year old female who comes in today for a followup visit of her diabetes. She states that her sugars have been very well-controlled at home. She continues to take 70/30 injections twice daily. She is also taking metformin. She states that she takes gabapentin but she's not sure why and she's not sure that helps. She states that her medicine to help her sleep does help very much and she would like a refill of at today's visit. She still is having a lot of stress in her life and she is trying to deal with adjusting to some of these changes. No chest pain, shortness of breath, falls at home, depression. She is still having pain in her knees but does not want to get them replaced. She is frustrated because she is not losing weight although she has had some dietary indiscretions.   Review of Systems     Objective:   Physical Exam  Constitutional: She is oriented to person, place, and time. She appears well-developed and well-nourished.  HENT:  Head: Normocephalic and atraumatic.  Eyes: EOM are normal. Pupils are equal, round, and reactive to light.  Neck: Normal range of motion. Neck supple.  Cardiovascular: Normal rate and regular rhythm.   Pulmonary/Chest: Effort normal and breath sounds normal. No respiratory distress. She has no wheezes. She has no rales. She exhibits no tenderness.  Abdominal: Soft. Bowel sounds are normal. She exhibits no distension. There is no tenderness. There is no rebound.  Musculoskeletal: Normal range of motion. She exhibits tenderness. She exhibits no edema.       Both knees are hurting her.  Neurological: She is alert and oriented to person, place, and time. No cranial nerve deficit.  Skin: Skin is warm and dry.  Psychiatric: She has a normal mood and affect. Her behavior is normal.       Assessment/Plan:   1. Please see problem oriented  charting.  2. Disposition-the patient will stop taking metformin. She will taper off and stop taking gabapentin. She will be seen back in 3 months. She was given pneumonia shot and flu vaccine at today's visit.

## 2012-01-29 ENCOUNTER — Other Ambulatory Visit: Payer: Self-pay | Admitting: Internal Medicine

## 2012-01-29 DIAGNOSIS — I10 Essential (primary) hypertension: Secondary | ICD-10-CM

## 2012-01-29 NOTE — Telephone Encounter (Signed)
Needs a BMP before any more refills given after this one. Thanks.

## 2012-03-15 ENCOUNTER — Ambulatory Visit (INDEPENDENT_AMBULATORY_CARE_PROVIDER_SITE_OTHER): Payer: Medicare Other | Admitting: Internal Medicine

## 2012-03-15 ENCOUNTER — Encounter: Payer: Self-pay | Admitting: Internal Medicine

## 2012-03-15 VITALS — BP 151/92 | HR 89 | Temp 98.6°F | Ht 65.0 in | Wt 196.6 lb

## 2012-03-15 DIAGNOSIS — E119 Type 2 diabetes mellitus without complications: Secondary | ICD-10-CM

## 2012-03-15 DIAGNOSIS — E1149 Type 2 diabetes mellitus with other diabetic neurological complication: Secondary | ICD-10-CM

## 2012-03-15 DIAGNOSIS — F32A Depression, unspecified: Secondary | ICD-10-CM

## 2012-03-15 DIAGNOSIS — E1142 Type 2 diabetes mellitus with diabetic polyneuropathy: Secondary | ICD-10-CM

## 2012-03-15 DIAGNOSIS — F3289 Other specified depressive episodes: Secondary | ICD-10-CM

## 2012-03-15 DIAGNOSIS — F329 Major depressive disorder, single episode, unspecified: Secondary | ICD-10-CM

## 2012-03-15 DIAGNOSIS — I10 Essential (primary) hypertension: Secondary | ICD-10-CM

## 2012-03-15 DIAGNOSIS — G4700 Insomnia, unspecified: Secondary | ICD-10-CM

## 2012-03-15 DIAGNOSIS — F419 Anxiety disorder, unspecified: Secondary | ICD-10-CM

## 2012-03-15 DIAGNOSIS — E785 Hyperlipidemia, unspecified: Secondary | ICD-10-CM

## 2012-03-15 MED ORDER — LORATADINE 10 MG PO TABS: 10.0000 mg | ORAL_TABLET | Freq: Every day | ORAL | Status: DC

## 2012-03-15 MED ORDER — METFORMIN HCL 1000 MG PO TABS: 1000.0000 mg | ORAL_TABLET | Freq: Two times a day (BID) | ORAL | Status: DC

## 2012-03-15 MED ORDER — ALPRAZOLAM 0.5 MG PO TABS: 0.5000 mg | ORAL_TABLET | Freq: Three times a day (TID) | ORAL | Status: DC | PRN

## 2012-03-15 MED ORDER — ASPIRIN EC 81 MG PO TBEC: 81.0000 mg | DELAYED_RELEASE_TABLET | Freq: Every day | ORAL | Status: DC

## 2012-03-15 MED ORDER — HYDROCHLOROTHIAZIDE 25 MG PO TABS: 25.0000 mg | ORAL_TABLET | Freq: Every day | ORAL | Status: DC

## 2012-03-15 MED ORDER — FLUOXETINE HCL 40 MG PO CAPS: 40.0000 mg | ORAL_CAPSULE | Freq: Every day | ORAL | Status: DC

## 2012-03-15 MED ORDER — ZOLPIDEM TARTRATE 5 MG PO TABS: 5.0000 mg | ORAL_TABLET | Freq: Every evening | ORAL | Status: DC | PRN

## 2012-03-15 MED ORDER — TRAMADOL HCL 50 MG PO TABS: 100.0000 mg | ORAL_TABLET | Freq: Four times a day (QID) | ORAL | Status: DC | PRN

## 2012-03-15 MED ORDER — LISINOPRIL 40 MG PO TABS: 40.0000 mg | ORAL_TABLET | Freq: Every day | ORAL | Status: DC

## 2012-03-15 MED ORDER — INSULIN ASPART PROT & ASPART (70-30 MIX) 100 UNIT/ML ~~LOC~~ SUSP: 10.0000 [IU] | Freq: Two times a day (BID) | SUBCUTANEOUS | Status: DC

## 2012-03-15 MED ORDER — HYDROXYZINE HCL 25 MG PO TABS: 25.0000 mg | ORAL_TABLET | Freq: Four times a day (QID) | ORAL | Status: DC | PRN

## 2012-03-15 MED ORDER — TRAMADOL HCL 50 MG PO TABS: 50.0000 mg | ORAL_TABLET | Freq: Four times a day (QID) | ORAL | Status: DC | PRN

## 2012-03-15 NOTE — Assessment & Plan Note (Signed)
She still continues to inject herself and take metformin. I had previously advised her to quit it however her sugars appear to be fine and so will continue metformin and 70/30 10 units BID.

## 2012-03-15 NOTE — Assessment & Plan Note (Signed)
Will need repeat FLP in the spring as the last one was taken when she had been off her medications for some time. Will continue current statin and titrate as needed in the future.

## 2012-03-15 NOTE — Progress Notes (Signed)
Subjective:     Patient ID: [name], female   DOB: 02-26-1946, 66 y.o.   MRN: [id number]  HPI The patient is a 66 YO female who comes in today for a follow up visit of her diabetes. She states that she recently was kneeling on the floor for a long period of time and now her knees are bothering her more. She has run out of tramadol and has been taking some ibuprofen for it. Her sugars are well controlled at home with at least 70% in the range we are aiming for. She did bring her meter in for review today. She needs refills on her medications and several of hers are out entirely and she has not taking her mood medicine in some time.   Review of Systems  Constitutional: Negative for fever, chills, diaphoresis, activity change, appetite change, fatigue and unexpected weight change.  HENT: Negative.   Eyes: Negative.   Respiratory: Negative for cough, chest tightness, shortness of breath and wheezing.   Cardiovascular: Negative for chest pain, palpitations and leg swelling.  Gastrointestinal: Negative for nausea, vomiting, abdominal pain, diarrhea and constipation.  Musculoskeletal: Positive for arthralgias. Negative for myalgias, back pain, joint swelling and gait problem.  Skin: Negative.   Neurological: Negative for dizziness, tremors, seizures, syncope, facial asymmetry, speech difficulty, weakness, light-headedness, numbness and headaches.  Psychiatric/Behavioral: Positive for sleep disturbance and dysphoric mood. Negative for suicidal ideas, hallucinations, behavioral problems, confusion, self-injury, decreased concentration and agitation. The patient is not nervous/anxious and is not hyperactive.        Objective:   Physical Exam  Constitutional: She is oriented to person, place, and time. She appears well-developed and well-nourished.       Obese  HENT:  Head: Normocephalic and atraumatic.  Eyes: EOM are normal. Pupils are equal, round, and reactive to light.  Neck: Normal range  of motion. Neck supple. No JVD present. No thyromegaly present.  Cardiovascular: Normal rate and regular rhythm.   Pulmonary/Chest: Effort normal and breath sounds normal. No respiratory distress. She has no wheezes. She has no rales. She exhibits no tenderness.  Abdominal: Soft. Bowel sounds are normal. She exhibits no distension. There is no rebound and no guarding.  Musculoskeletal: Normal range of motion. She exhibits no edema and no tenderness.  Neurological: She is alert and oriented to person, place, and time. No cranial nerve deficit.  Skin: Skin is warm and dry.       Assessment/Plan:   1. Please see problem oriented charting.   2. Disposition - The patient will be seen back in January for follow up and HgA1c. Checked a BMP at today's visit. Refilled most of her medications and reminded her to make sure to call for refills before her medications have run out and not to stop taking them as she has done in the past. We discussed increasing her level of exercise and continuing to lose weight.

## 2012-03-15 NOTE — Patient Instructions (Signed)
We will refill your medicines and you can take the tramadol for your leg pain. We would like you to still take the same medicines otherwise. Come back in January for a check up. Call us if the pain isn't getting better at [phone].

## 2012-03-15 NOTE — Assessment & Plan Note (Signed)
She needs to continue taking her medicine and increase her level of exercise. She does seem to have some dysphoric mood right now.

## 2012-03-15 NOTE — Assessment & Plan Note (Signed)
She is well controlled when she takes her medicine and have refilled her medications (which she was out of) today. Advised her again to not let her medications run out but to refill them.

## 2012-03-16 LAB — BASIC METABOLIC PANEL WITH GFR
BUN: 22 mg/dL (ref 6–23)
CO2: 28 mEq/L (ref 19–32)
Calcium: 9.4 mg/dL (ref 8.4–10.5)
Chloride: 106 mEq/L (ref 96–112)
Creat: 1.63 mg/dL — ABNORMAL HIGH (ref 0.50–1.10)
GFR, Est Non African American: 33 mL/min — ABNORMAL LOW

## 2012-03-27 ENCOUNTER — Other Ambulatory Visit: Payer: Self-pay | Admitting: Internal Medicine

## 2012-03-27 DIAGNOSIS — I10 Essential (primary) hypertension: Secondary | ICD-10-CM

## 2012-04-11 ENCOUNTER — Other Ambulatory Visit (INDEPENDENT_AMBULATORY_CARE_PROVIDER_SITE_OTHER): Payer: Medicare Other

## 2012-04-11 DIAGNOSIS — I10 Essential (primary) hypertension: Secondary | ICD-10-CM

## 2012-04-11 LAB — BASIC METABOLIC PANEL WITH GFR
Calcium: 9.3 mg/dL (ref 8.4–10.5)
GFR, Est African American: 49 mL/min — ABNORMAL LOW
GFR, Est Non African American: 42 mL/min — ABNORMAL LOW
Sodium: 139 mEq/L (ref 135–145)

## 2012-04-25 ENCOUNTER — Ambulatory Visit: Payer: Medicare Other | Admitting: Internal Medicine

## 2012-04-30 ENCOUNTER — Encounter: Payer: Self-pay | Admitting: Internal Medicine

## 2012-04-30 ENCOUNTER — Ambulatory Visit (INDEPENDENT_AMBULATORY_CARE_PROVIDER_SITE_OTHER): Payer: Medicare Other | Admitting: Internal Medicine

## 2012-04-30 VITALS — BP 142/94 | HR 81 | Temp 96.7°F | Ht 65.0 in | Wt 197.2 lb

## 2012-04-30 DIAGNOSIS — L309 Dermatitis, unspecified: Secondary | ICD-10-CM | POA: Insufficient documentation

## 2012-04-30 DIAGNOSIS — Z79899 Other long term (current) drug therapy: Secondary | ICD-10-CM

## 2012-04-30 DIAGNOSIS — E1149 Type 2 diabetes mellitus with other diabetic neurological complication: Secondary | ICD-10-CM

## 2012-04-30 DIAGNOSIS — L259 Unspecified contact dermatitis, unspecified cause: Secondary | ICD-10-CM

## 2012-04-30 DIAGNOSIS — E119 Type 2 diabetes mellitus without complications: Secondary | ICD-10-CM

## 2012-04-30 LAB — GLUCOSE, CAPILLARY: Glucose-Capillary: 82 mg/dL (ref 70–99)

## 2012-04-30 LAB — POCT GLYCOSYLATED HEMOGLOBIN (HGB A1C): Hemoglobin A1C: 6

## 2012-04-30 MED ORDER — FLUOCINONIDE-E 0.05 % EX CREA: TOPICAL_CREAM | CUTANEOUS | Status: DC

## 2012-04-30 MED ORDER — DIPHENHYDRAMINE HCL 25 MG PO CAPS: 25.0000 mg | ORAL_CAPSULE | Freq: Every evening | ORAL | Status: DC | PRN

## 2012-04-30 NOTE — Patient Instructions (Addendum)
Follow up with PCP in 3 months, sooner if needed.  Please call the clinic if rash does not get better in one week.

## 2012-04-30 NOTE — Progress Notes (Signed)
Internal Medicine Clinic Visit    HPI:  [name] is a 68 y.o. year old female with a history of DM, HTN, HLD, who presents for an acute visit for a spot on her leg.  She states that she has had this for about 2 weeks. Started as small flesh colored bumps that appeared to have fluid inside, limited to a small area on her leg. Then they dried up and now she has dry, thickened, dark skin with a few residual bumps. She says this is not painful, but it is itchy. Patient was worried that this is herpes zoster as she has had this in the past.  She denies fever, chills, edema, recent travel, insect exposure, trauma.   Past Medical History  Diagnosis Date  . Diabetes mellitus   . Asthma     childhood  . Depression   . Anxiety   . Hypertension   . Hyperlipidemia   . 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   Marland Kitchen Hx MRSA infection 2009  . Dysphagia   . Personal history of colonic polyps 10/17/2010    hyperplastic  . Barrett's esophagus   . Hiatal hernia     Past Surgical History  Procedure Date  . Abdominal hysterectomy     unclear when     ROS:  A complete review of systems was otherwise negative, except as noted in the HPI.  Allergies: Pioglitazone and Sulfonamide derivatives  Medications: Current Outpatient Prescriptions  Medication Sig Dispense Refill  . ACCU-CHEK FASTCLIX LANCETS MISC 1 Device by Does not apply route 4 (four) times daily -  before meals and at bedtime. Dx: 250.00  100 each  11  . ALPRAZolam (XANAX) 0.5 MG tablet Take 1 tablet (0.5 mg total) by mouth 3 (three) times daily as needed for anxiety.  60 tablet  0  . aspirin EC 81 MG tablet Take 1 tablet (81 mg total) by mouth daily.  30 tablet  5  . Blood Glucose Monitoring Suppl (ACCU-CHEK NANO SMARTVIEW) W/DEVICE KIT 1 Device by Does not apply route 4 (four) times daily -  before meals and at bedtime. Dx; 250.00  1 kit  1  .  diphenhydrAMINE (BENADRYL) 25 mg capsule Take 1 capsule (25 mg total) by mouth at bedtime as needed for itching.  24 capsule  0  . fluocinonide-emollient (LIDEX-E) 0.05 % cream Apply thin layer to affected area twice daily  30 g  0  . FLUoxetine (PROZAC) 40 MG capsule Take 1 capsule (40 mg total) by mouth daily.  30 capsule  2  . glucose blood (ACCU-CHEK SMARTVIEW) test strip Use as instructed  100 each  12  . hydrochlorothiazide (HYDRODIURIL) 25 MG tablet Take 1 tablet (25 mg total) by mouth daily.  30 tablet  6  . hydrOXYzine (ATARAX/VISTARIL) 25 MG tablet Take 1 tablet (25 mg total) by mouth every 6 (six) hours as needed. For itching  30 tablet  0  . insulin aspart protamine-insulin aspart (NOVOLOG MIX 70/30 FLEXPEN) (70-30) 100 UNIT/ML injection Inject 10 Units into the skin 2 (two) times daily with a meal.  15 mL  12  . Insulin Syringe-Needle U-100 (INSULIN SYRINGE .5CC/31GX5/16") 31G X 5/16" 0.5 ML MISC 1 each by Does not apply route 2 (two) times daily before a meal.  100 each  3  . latanoprost (XALATAN) 0.005 % ophthalmic solution       . lisinopril (  PRINIVIL,ZESTRIL) 40 MG tablet Take 1 tablet (40 mg total) by mouth daily.  30 tablet  6  . loratadine (CLARITIN) 10 MG tablet Take 1 tablet (10 mg total) by mouth daily.  30 tablet  6  . metFORMIN (GLUCOPHAGE) 1000 MG tablet Take 1 tablet (1,000 mg total) by mouth 2 (two) times daily with a meal. Break pills in half for easier swallowing.  60 tablet  11  . omeprazole (PRILOSEC) 40 MG capsule Take 1 capsule (40 mg total) by mouth daily.  30 capsule  11  . pravastatin (PRAVACHOL) 40 MG tablet Take 1 tablet (40 mg total) by mouth daily.  30 tablet  5  . ReliOn Ultra Thin Lancets MISC       . traMADol (ULTRAM) 50 MG tablet Take 2-3 tablets (100-150 mg total) by mouth every 6 (six) hours as needed. Up to 4 times a day for pain  180 tablet  3  . zolpidem (AMBIEN) 5 MG tablet Take 1 tablet (5 mg total) by mouth at bedtime as needed for sleep.  30  tablet  2    History   Social History  . Marital Status: Divorced    Spouse Name: N/A    Number of Children: 0  . Years of Education: N/A   Occupational History  . Retired    Social History Main Topics  . Smoking status: Current Every Day Smoker -- 0.2 packs/day for 40 years    Types: Cigarettes  . Smokeless tobacco: Never Used     Comment: 1 pack/ week  . Alcohol Use: 1.0 oz/week    2 drink(s) per week     Comment: 2 drinks on w/e  . Drug Use: No  . Sexually Active: Not on file   Other Topics Concern  . Not on file   Social History Narrative   Financial assistance approved for 100% discount at [facility] and has [facility] card per [place]10/20/20113 caffeine drinks daily     family history is negative for Colon cancer.  Physical Exam Blood pressure 142/94, pulse 81, temperature 96.7 F (35.9 C), temperature source Oral, height 5\' 5"  (1.651 m), weight 197 lb 3.2 oz (89.449 kg), SpO2 97.00%. General:  No acute distress, alert and oriented x 3, well-appearing  HEENT:  PERRL, EOMI, no lymphadenopathy, moist mucous membranes Cardiovascular:  Regular rate and rhythm, no murmurs, rubs or gallops Respiratory:  Clear to auscultation bilaterally, no wheezes, rales, or rhonchi Abdomen:  Soft, nondistended, nontender, normoactive bowel sounds Extremities:  Warm and well-perfused, no clubbing, cyanosis, or edema.  RLE: 2x2 inch patch of hyperpigmented and moderately thickened skin with areas of hypopigmentation as well. Some excoriations noted. No fluid drainage or blood. No discrete ulcerations. Some small surrounding vesicles. Non tender, non erythematous. Not warm compared to surrounding skin. Neuro: Not anxious appearing, no depressed mood, normal affect  Labs: Lab Results  Component Value Date   CREATININE 1.31* 04/11/2012   BUN 23 04/11/2012   NA 139 04/11/2012   K 4.9 04/11/2012   CL 104 04/11/2012   CO2 25 04/11/2012   Lab Results  Component Value Date   WBC 7.4  09/29/2010   HGB 12.3 09/29/2010   HCT 36.6 09/29/2010   MCV 90.5 09/29/2010   PLT 255.0 09/29/2010      Assessment and Plan:  FOLLOWUP: [name] will follow back up in our clinic in approximately 3 months. [name] knows to call out clinic in the meantime with any questions or  new issues.   Patient was seen and evaluated by Santa Lighter, MD,  and Milta Deiters, MD Attending Physician.

## 2012-05-01 NOTE — Assessment & Plan Note (Signed)
Patient with hyperpigmented lesion consistent with dermatitis. She does not have any overlying cellulitis or associated edema. No recent travel, insect bites, or exposures. She has been using topical corticosteroid cream from a previous episode of a similar rash and it has been improving. She has run out of this medication.  -refill fluocinonide cream -return to clinic or call if rash does not improve or worsens

## 2012-05-10 ENCOUNTER — Encounter: Payer: Self-pay | Admitting: Internal Medicine

## 2012-05-10 ENCOUNTER — Ambulatory Visit (INDEPENDENT_AMBULATORY_CARE_PROVIDER_SITE_OTHER): Payer: Medicare Other | Admitting: Internal Medicine

## 2012-05-10 VITALS — BP 140/94 | HR 80 | Temp 97.0°F | Ht 65.0 in | Wt 189.6 lb

## 2012-05-10 DIAGNOSIS — M25569 Pain in unspecified knee: Secondary | ICD-10-CM

## 2012-05-10 DIAGNOSIS — E1149 Type 2 diabetes mellitus with other diabetic neurological complication: Secondary | ICD-10-CM

## 2012-05-10 DIAGNOSIS — M25562 Pain in left knee: Secondary | ICD-10-CM

## 2012-05-10 DIAGNOSIS — L259 Unspecified contact dermatitis, unspecified cause: Secondary | ICD-10-CM

## 2012-05-10 DIAGNOSIS — I10 Essential (primary) hypertension: Secondary | ICD-10-CM

## 2012-05-10 DIAGNOSIS — L309 Dermatitis, unspecified: Secondary | ICD-10-CM

## 2012-05-10 DIAGNOSIS — E1142 Type 2 diabetes mellitus with diabetic polyneuropathy: Secondary | ICD-10-CM

## 2012-05-10 MED ORDER — HYDROCODONE-ACETAMINOPHEN 5-325 MG PO TABS: 1.0000 | ORAL_TABLET | Freq: Two times a day (BID) | ORAL | Status: DC | PRN

## 2012-05-10 MED ORDER — FLUOCINONIDE-E 0.05 % EX CREA: TOPICAL_CREAM | CUTANEOUS | Status: DC

## 2012-05-10 NOTE — Assessment & Plan Note (Signed)
Lab Results  Component Value Date   HGBA1C 6.0 04/30/2012   HGBA1C 6.4 01/12/2012   HGBA1C 7.1 10/20/2011     Assessment:  Diabetes control: good control (HgbA1C at goal)  Progress toward A1C goal:  at goal  Comments:   Plan:  Medications:  continue current medications  Home glucose monitoring:   Frequency: 4 times a day   Timing: before meals;at bedtime  Instruction/counseling given: reminded to bring blood glucose meter & log to each visit and reminded to bring medications to each visit  Educational resources provided: brochure  Self management tools provided:    Other plans:

## 2012-05-10 NOTE — Assessment & Plan Note (Addendum)
BP Readings from Last 3 Encounters:  05/10/12 140/94  04/30/12 142/94  03/15/12 151/92    Lab Results  Component Value Date   NA 139 04/11/2012   K 4.9 04/11/2012   CREATININE 1.31* 04/11/2012    Assessment:  Blood pressure control: controlled  Progress toward BP goal:  at goal  Comments: slightly above goal due to pain in knee  Plan:  Medications:  continue current medications, hctz 25, lisinopril 40 mg   Educational resources provided: brochure  Self management tools provided:    Other plans:

## 2012-05-10 NOTE — Progress Notes (Addendum)
Subjective:     Patient ID: [name], female   DOB: [birth year], 67 y.o.   MRN: [id number]  HPI The patient is a 67 year old female who is well-known to me. She returns for followup visit and is having acute knee pain. She states that a week or 2 ago she was down trying to get something from under the bed and she feels she put excessive strain/possible injury to that knee. It is her left knee. Since then she has had occasional instances when she feels like it's giving out on her. She did slip and fall in the bathroom about a week and half ago. She does not have any residual symptoms from this except the knee pain. She was seen about a week ago in clinic for some itching. She states that she's having trouble not scratching however the cream that she was given does seem to be helping all these areas. She states that she's tried tramadol for the knee pain as well as over-the-counter Tylenol and ibuprofen. She is not having any chest pains, shortness of breath, nausea, vomiting, diarrhea. She is not having any fevers or chills at home. She does not want to try knee injection today.  Review of Systems  Constitutional: Negative for fever, chills, diaphoresis, activity change, appetite change, fatigue and unexpected weight change.  HENT: Negative.   Eyes: Negative.   Respiratory: Negative for cough, chest tightness, shortness of breath and wheezing.   Cardiovascular: Negative for chest pain, palpitations and leg swelling.  Gastrointestinal: Negative for nausea, vomiting, abdominal pain, diarrhea and constipation.  Musculoskeletal: Positive for myalgias and arthralgias. Negative for back pain, joint swelling and gait problem.  Skin: Positive for rash. Negative for color change, pallor and wound.  Neurological: Negative for dizziness, tremors, seizures, syncope, facial asymmetry, speech difficulty, weakness, light-headedness, numbness and headaches.  Psychiatric/Behavioral: Positive for sleep  disturbance. Negative for suicidal ideas, hallucinations, behavioral problems, confusion, self-injury, dysphoric mood, decreased concentration and agitation. The patient is not nervous/anxious and is not hyperactive.        Objective:   Physical Exam  Constitutional: She is oriented to person, place, and time. She appears well-developed and well-nourished.       Obese  HENT:  Head: Normocephalic and atraumatic.  Eyes: EOM are normal. Pupils are equal, round, and reactive to light.  Neck: Normal range of motion. Neck supple. No JVD present. No thyromegaly present.  Cardiovascular: Normal rate and regular rhythm.   Pulmonary/Chest: Effort normal and breath sounds normal. No respiratory distress. She has no wheezes. She has no rales. She exhibits no tenderness.  Abdominal: Soft. Bowel sounds are normal. She exhibits no distension. There is no rebound and no guarding.  Musculoskeletal: Normal range of motion. She exhibits tenderness. She exhibits no edema.       Left knee is tender to palpation and has some soft tissue swelling however no effusion present. Skin shows no redness. Drawer sign negative.  Neurological: She is alert and oriented to person, place, and time. No cranial nerve deficit.  Skin: Skin is warm and dry. Rash noted.       Some dark spots where she has been scratching, no bumps or skin breakdown.        Assessment/Plan:   1. Please see problem-oriented charting.  2. Knee pain-patient will be referred to orthopedics for evaluation of possible ligament instability versus knee joint replacement. Suspect this is strain/sprain. Advised ice and warm compresses for pain. She is given prescription for Vicodin 5/325 #  60 refill 0. Advised to use cane and given prescription for a cane at today's visit. Advised to hold a cane in her left hand as she is having left knee instability.  3. Disposition-the patient will followup with orthopedics. She will come back in 3 months if she is doing  well. Prescription given for Vicodin. No other changes to medications at today's visit

## 2012-05-10 NOTE — Patient Instructions (Addendum)
We will see you back in 3 months for a follow up visit. Go to see the orthopedic doctor about your knee. We will give you pain medicine and you can take that twice a day 1 pill to help the pain. Otherwise, no changes to your medicines today.  Treatment Goals:  Goals (1 Years of Data) as of 05/10/2012          As of Today 04/30/12 04/30/12 03/15/12 01/12/12     Blood Pressure    . Blood Pressure < 140/90  150/100 142/94 140/91 151/92 127/83     Diet    . Increase water intake- especially when blood sugars are high           Result Component    . HEMOGLOBIN A1C < 7.0   6.0   6.4    . LDL CALC < 100            Progress Toward Treatment Goals:  Treatment Goal 05/10/2012  Hemoglobin A1C at goal  Blood pressure at goal  Stop smoking stopped smoking    Self Care Goals & Plans:  Self Care Goal 05/10/2012  Manage my medications bring my medications to every visit  Monitor my health keep track of my blood glucose; bring my glucose meter and log to each visit    Home Blood Glucose Monitoring 05/10/2012  Check my blood sugar 4 times a day  When to check my blood sugar before meals; at bedtime     Care Management & Community Referrals:  Referral 05/10/2012  Referrals made for care management support none needed  Referrals made to community resources falls prevention

## 2012-05-10 NOTE — Assessment & Plan Note (Signed)
The patient does have trouble not itching however has been using cream on these areas and states that they're resolving slowly. Advised to wash all bed linens, pillows, clothing, shower wash cloths/scrubbing devices. Advised to avoid scratching if at all possible. Advised cutting nails.

## 2012-06-05 ENCOUNTER — Ambulatory Visit: Payer: Medicare Other | Admitting: Internal Medicine

## 2012-06-06 ENCOUNTER — Ambulatory Visit: Payer: Medicare Other | Admitting: Internal Medicine

## 2012-06-11 ENCOUNTER — Ambulatory Visit (INDEPENDENT_AMBULATORY_CARE_PROVIDER_SITE_OTHER): Payer: Medicare Other | Admitting: Internal Medicine

## 2012-06-11 ENCOUNTER — Encounter: Payer: Self-pay | Admitting: Internal Medicine

## 2012-06-11 VITALS — BP 141/88 | HR 97 | Temp 97.2°F | Ht 66.0 in | Wt 191.3 lb

## 2012-06-11 DIAGNOSIS — I10 Essential (primary) hypertension: Secondary | ICD-10-CM

## 2012-06-11 DIAGNOSIS — L259 Unspecified contact dermatitis, unspecified cause: Secondary | ICD-10-CM

## 2012-06-11 DIAGNOSIS — M25569 Pain in unspecified knee: Secondary | ICD-10-CM

## 2012-06-11 DIAGNOSIS — F419 Anxiety disorder, unspecified: Secondary | ICD-10-CM

## 2012-06-11 DIAGNOSIS — E1149 Type 2 diabetes mellitus with other diabetic neurological complication: Secondary | ICD-10-CM

## 2012-06-11 DIAGNOSIS — G4700 Insomnia, unspecified: Secondary | ICD-10-CM

## 2012-06-11 DIAGNOSIS — F329 Major depressive disorder, single episode, unspecified: Secondary | ICD-10-CM

## 2012-06-11 DIAGNOSIS — K219 Gastro-esophageal reflux disease without esophagitis: Secondary | ICD-10-CM

## 2012-06-11 DIAGNOSIS — N189 Chronic kidney disease, unspecified: Secondary | ICD-10-CM | POA: Insufficient documentation

## 2012-06-11 DIAGNOSIS — L309 Dermatitis, unspecified: Secondary | ICD-10-CM

## 2012-06-11 LAB — BASIC METABOLIC PANEL
BUN: 22 mg/dL (ref 6–23)
Calcium: 9.7 mg/dL (ref 8.4–10.5)
Chloride: 108 mEq/L (ref 96–112)
Creat: 1.31 mg/dL — ABNORMAL HIGH (ref 0.50–1.10)

## 2012-06-11 MED ORDER — OMEPRAZOLE 40 MG PO CPDR: 40.0000 mg | DELAYED_RELEASE_CAPSULE | Freq: Every day | ORAL | Status: DC

## 2012-06-11 MED ORDER — LORATADINE 10 MG PO TABS: 10.0000 mg | ORAL_TABLET | Freq: Every day | ORAL | Status: DC

## 2012-06-11 MED ORDER — ALPRAZOLAM 0.5 MG PO TABS: 0.5000 mg | ORAL_TABLET | Freq: Two times a day (BID) | ORAL | Status: DC | PRN

## 2012-06-11 MED ORDER — HYDROXYZINE HCL 25 MG PO TABS: 25.0000 mg | ORAL_TABLET | Freq: Three times a day (TID) | ORAL | Status: DC | PRN

## 2012-06-11 MED ORDER — ZOLPIDEM TARTRATE 5 MG PO TABS: 5.0000 mg | ORAL_TABLET | Freq: Every evening | ORAL | Status: DC | PRN

## 2012-06-11 NOTE — Patient Instructions (Addendum)
1. Stop eating Tangerines.   2. Stop taking Benadryl  3. Take Loratadine every day. For itching use Hydroxyzine.  4. For heart burn: omeprazole

## 2012-06-11 NOTE — Assessment & Plan Note (Signed)
Allergic reaction possibly due to tangerine. I recommended to stop eating tangerines. Considering patient's is taking Xanax and Ambien I recommended to discontinue Benadryl. He prescribed again loratadine which she  should take every day.hydroxyzine when necessary for itching. I recommended if her symptoms does not improve with discontinuing tangerines she needs to be seen for further evaluation and management.

## 2012-06-11 NOTE — Assessment & Plan Note (Signed)
Patient has been not taking omeprazole. I prescribed it again and recommended to take it every day to

## 2012-06-11 NOTE — Assessment & Plan Note (Signed)
Refilled Ambien prescription today.

## 2012-06-11 NOTE — Assessment & Plan Note (Signed)
Refill her Xanax prescription today.

## 2012-06-11 NOTE — Progress Notes (Signed)
Subjective:   Patient ID: [name] female   DOB: Dec 31, 1945 67 y.o.   MRN: [id number]  HPI: [name] is a 67 y.o. female with past medical history significant as outlined below who presented to the clinic for a followup of her rash. Patient reports that she continues to have bumps on hands and her legs which are significantly itchy. She has been using Benadryl 3-4 times a day at least to relief the itchiness.patient was evaluated in January for similar symptoms was prescribed Benadryl, loratadine, hydrocortisone cream,hydroxyzine but patient had only refill to Benadryl and hydrocortisone cream at this point. Discussing with any changes in her daily life patient reports she has been eating 1-2 packs of tangerines since January. She had been started on meloxicam recently by her orthopedic for knee pain. Denies any changes in detergent, perfume or clothing. Patient noted she had somewhat similar symptoms with tomatoes    Past Medical History  Diagnosis Date  . Diabetes mellitus   . Asthma     childhood  . Depression   . Anxiety   . Hypertension   . Hyperlipidemia   . 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   Marland Kitchen Hx MRSA infection 2009  . Dysphagia   . Personal history of colonic polyps 10/17/2010    hyperplastic  . Barrett's esophagus   . Hiatal hernia    Current Outpatient Prescriptions  Medication Sig Dispense Refill  . ACCU-CHEK FASTCLIX LANCETS MISC 1 Device by Does not apply route 4 (four) times daily -  before meals and at bedtime. Dx: 250.00  100 each  11  . ALPRAZolam (XANAX) 0.5 MG tablet Take 1 tablet (0.5 mg total) by mouth 2 (two) times daily as needed for anxiety.  60 tablet  0  . aspirin EC 81 MG tablet Take 1 tablet (81 mg total) by mouth daily.  30 tablet  5  . Blood Glucose Monitoring Suppl (ACCU-CHEK NANO SMARTVIEW) W/DEVICE KIT 1 Device by Does not apply route 4 (four) times daily -   before meals and at bedtime. Dx; 250.00  1 kit  1  . fluocinonide-emollient (LIDEX-E) 0.05 % cream Apply thin layer to affected area twice daily  30 g  0  . FLUoxetine (PROZAC) 40 MG capsule Take 1 capsule (40 mg total) by mouth daily.  30 capsule  2  . glucose blood (ACCU-CHEK SMARTVIEW) test strip Use as instructed  100 each  12  . hydrochlorothiazide (HYDRODIURIL) 25 MG tablet Take 1 tablet (25 mg total) by mouth daily.  30 tablet  6  . hydrOXYzine (ATARAX/VISTARIL) 25 MG tablet Take 1 tablet (25 mg total) by mouth every 8 (eight) hours as needed. For itching  30 tablet  0  . insulin aspart protamine-insulin aspart (NOVOLOG MIX 70/30 FLEXPEN) (70-30) 100 UNIT/ML injection Inject 10 Units into the skin 2 (two) times daily with a meal.  15 mL  12  . Insulin Syringe-Needle U-100 (INSULIN SYRINGE .5CC/31GX5/16") 31G X 5/16" 0.5 ML MISC 1 each by Does not apply route 2 (two) times daily before a meal.  100 each  3  . latanoprost (XALATAN) 0.005 % ophthalmic solution       . lisinopril (PRINIVIL,ZESTRIL) 40 MG tablet Take 1 tablet (40 mg total) by mouth daily.  30 tablet  6  . loratadine (CLARITIN) 10 MG tablet Take 1 tablet (10 mg total) by mouth daily.  30 tablet  0  . meloxicam (MOBIC) 15 MG tablet Prescribed by Ortho      . metFORMIN (GLUCOPHAGE) 1000 MG tablet Take 1 tablet (1,000 mg total) by mouth 2 (two) times daily with a meal. Break pills in half for easier swallowing.  60 tablet  11  . omeprazole (PRILOSEC) 40 MG capsule Take 1 capsule (40 mg total) by mouth daily.  30 capsule  2  . pravastatin (PRAVACHOL) 40 MG tablet Take 1 tablet (40 mg total) by mouth daily.  30 tablet  5  . ReliOn Ultra Thin Lancets MISC       . traMADol (ULTRAM) 50 MG tablet Take 2-3 tablets (100-150 mg total) by mouth every 6 (six) hours as needed. Up to 4 times a day for pain  180 tablet  3  . zolpidem (AMBIEN) 5 MG tablet Take 1 tablet (5 mg total) by mouth at bedtime as needed for sleep.  30 tablet  2   No  current facility-administered medications for this visit.   Family History  Problem Relation Age of Onset  . Colon cancer Neg Hx    History   Social History  . Marital Status: Divorced    Spouse Name: N/A    Number of Children: 0  . Years of Education: N/A   Occupational History  . Retired    Social History Main Topics  . Smoking status: Current Every Day Smoker -- 0.25 packs/day for 40 years    Types: Cigarettes  . Smokeless tobacco: Never Used  . Alcohol Use: 1.0 oz/week    2 drink(s) per week     Comment: wine  . Drug Use: No  . Sexually Active: None   Other Topics Concern  . None   Social History Narrative   Financial assistance approved for 100% discount at [facility] and has [facility] card per [employer]   02/10/2010      3 caffeine drinks daily    Review of Systems: Constitutional: Denies fever, chills, diaphoresis, appetite change and fatigue.  Respiratory: Denies SOB, DOE, cough, chest tightness,  and wheezing.   Cardiovascular: Denies chest pain, palpitations and leg swelling.  Gastrointestinal:Noted some heartburn and abdominal bloating   Objective:  Physical Exam: Filed Vitals:   06/11/12 1014 06/11/12 1045  BP: 157/95 141/88  Pulse: 97   Temp: 97.2 F (36.2 C)   TempSrc: Oral   Height: 5\' 6"  (1.676 m)   Weight: 191 lb 4.8 oz (86.773 kg)   SpO2: 98%    Constitutional: Vital signs reviewed.  Patient is a well-developed and well-nourished female in no acute distress and cooperative with exam. Alert and oriented x3.  Neck: Supple,  Cardiovascular: RRR, S1 normal, S2 normal, no MRG, pulses symmetric and intact bilaterally Pulmonary/Chest: CTAB, no wheezes, rales, or rhonchi Abdominal: Soft. Non-tender, non-distended, bowel sounds are normal,  Hematology: no cervical,   Neurological: A&O x3 Skin: multiple hyperpigmented and moderately thickened skin with areas of height at home pigmentation on arms and legs as well as back notable. Some excoriations  present. No fluid drainage, erythema, tenderness, vesicles noted.

## 2012-06-11 NOTE — Assessment & Plan Note (Signed)
Patient was prescribed meloxicam by Ortho for knee pain. I will obtain basic metabolic panel today. In the setting of CKD stage III patient should not be taking any nonsteroidal.

## 2012-07-17 ENCOUNTER — Ambulatory Visit (INDEPENDENT_AMBULATORY_CARE_PROVIDER_SITE_OTHER): Payer: Medicare Other | Admitting: Internal Medicine

## 2012-07-17 ENCOUNTER — Encounter: Payer: Self-pay | Admitting: Internal Medicine

## 2012-07-17 VITALS — BP 140/70 | HR 108 | Temp 97.4°F | Ht 66.0 in | Wt 196.3 lb

## 2012-07-17 DIAGNOSIS — L299 Pruritus, unspecified: Secondary | ICD-10-CM

## 2012-07-17 DIAGNOSIS — E119 Type 2 diabetes mellitus without complications: Secondary | ICD-10-CM

## 2012-07-17 DIAGNOSIS — R609 Edema, unspecified: Secondary | ICD-10-CM

## 2012-07-17 DIAGNOSIS — G4700 Insomnia, unspecified: Secondary | ICD-10-CM

## 2012-07-17 MED ORDER — ZOLPIDEM TARTRATE 5 MG PO TABS: 5.0000 mg | ORAL_TABLET | Freq: Every evening | ORAL | Status: DC | PRN

## 2012-07-17 MED ORDER — DIPHENHYDRAMINE-ZINC ACETATE 1-0.1 % EX CREA: TOPICAL_CREAM | Freq: Three times a day (TID) | CUTANEOUS | Status: DC | PRN

## 2012-07-17 NOTE — Progress Notes (Signed)
Subjective:     Patient ID: [name], female   DOB: 28-Apr-1945, 67 y.o.   MRN: [id number]  HPI The patient is a 67 YO female who has PMH of DM II, HTN, hyperlipidemia who comes in today for a follow up visit of her rash with itching. She has had this ongoing for multiple months now. She was trialed on several therapies each of which have produced some relief but not enough. She is using steroid cream which has helped with the skin disturbance however she is still having itching and scratching and it is a vicious cycle. She has taken benadryl pills which were too sedating however helped with the itching. She uses hydroxyzine which works for about 2-3 hours but does not produce permanent relief. There are no triggers identified. She thought it may have been tangerines but this is not the case as she stopped those 1 month ago without relief. She does have some anxiety that seems to produce the scratching and itching. She does not itch throughout our visit unless she is talking about the itching. She is not having fevers or chills at home. She has not changed medications, bedding, scents, soaps, no new clothes, detergent. She is wanting to have relief from the itching and knows that she should not scratch. She states she has also tried some witch hazel on it at home and does use alcohol baths on the affected area.   Review of Systems  Constitutional: Negative for fever, chills, diaphoresis, activity change, appetite change, fatigue and unexpected weight change.  HENT: Negative.   Eyes: Negative.   Respiratory: Negative for cough, chest tightness, shortness of breath and wheezing.   Cardiovascular: Negative for chest pain, palpitations and leg swelling.  Gastrointestinal: Negative for nausea, vomiting, abdominal pain, diarrhea and constipation.  Musculoskeletal: Positive for arthralgias. Negative for myalgias, back pain, joint swelling and gait problem.  Skin: Positive for rash. Negative for color  change, pallor and wound.  Neurological: Negative for dizziness, tremors, seizures, syncope, facial asymmetry, speech difficulty, weakness, light-headedness, numbness and headaches.  Psychiatric/Behavioral: Positive for sleep disturbance. Negative for suicidal ideas, hallucinations, behavioral problems, confusion, self-injury, dysphoric mood, decreased concentration and agitation. The patient is not nervous/anxious and is not hyperactive.        Objective:   Physical Exam  Constitutional: She is oriented to person, place, and time. She appears well-developed and well-nourished.  Obese  HENT:  Head: Normocephalic and atraumatic.  Eyes: EOM are normal. Pupils are equal, round, and reactive to light.  Neck: Normal range of motion. Neck supple. No JVD present. No thyromegaly present.  Cardiovascular: Normal rate and regular rhythm.   Pulmonary/Chest: Effort normal and breath sounds normal. No respiratory distress. She has no wheezes. She has no rales. She exhibits no tenderness.  Abdominal: Soft. Bowel sounds are normal. She exhibits no distension. There is no rebound and no guarding.  Musculoskeletal: Normal range of motion. She exhibits no edema and no tenderness.  Neurological: She is alert and oriented to person, place, and time. No cranial nerve deficit.  Skin: Skin is warm and dry. Rash noted.  Some dark spots where she has been scratching, no bumps or skin breakdown. Dark patches on legs lateral shin, two spots on the stomach where she injects her insulin.       Assessment/Plan:   1. Rash - Likely some component is causing by psychogenic itching however will check hepatic panel today to ensure that hepatic dysfunction is not the source of the  itching. Advised not to scratch and to continue with claritin. She had some luck with hydroxyzine and will continue. Will also give benadryl cream for relief of itching. Advised for her to cut her nails so that she is not harming her skin. Also  recommended stopping alcohol baths for her skin as they may be unnecessarily drying out her skin and causing worsening of her itching. Will refer to dermatology for further recommendations for creams or systemic therapies.   2. Disposition and Other medical problems - Not due for HgA1c and not having hypoglycemic episodes. Will check lipid panel today as it is almost due and was not at goal last time checked. No other changes to medical regimen and will see her back in 3 months for routine follow up.

## 2012-07-17 NOTE — Patient Instructions (Signed)
We will give you some benadryl cream to help with the itching. Use it wherever you itch and as often as you need. We will send you to a skin doctor to help [place] figure out what it going on. We will check on your liver today as well.  Come back in 3 months or as needed. Our number is [phone].  Itching Itching is a symptom that can be caused by many things. These include skin problems (including infections) as well as some internal diseases.  If the itching is affecting just one area of the body, it is most likely due to a common skin problem, such as:  Poison oak and poison ivy.  Contact dermatitis (skin irritation from a plant, chemicals, fiberglass, detergents, new cosmetic, new jewelry, or other substance).  Fungus (such as athlete's foot, jock itch, or ringworm).  Head lice  Dandruff  Insect bite  Infection (such as Shingles or other virus infections). If the itching is all over (widespread), the possible causes are many. These include:   Dry skin or eczema  Heat rash  Hives  Liver disorders  Kidney disorders TREATMENT  Localized itching   Lubrication of the skin. Use an ointment or cream or other unperfumed moisturizers if the skin is dry. Apply frequently, especially after bathing.  Anti-itch medicines. These medications may help control the urge to scratch. Scratching always makes itching worse and increases the chance of getting an infection.  Cortisone creams and ointments. These help reduce the inflammation.  Antibiotics. Skin infections can cause itching. Topical or oral antibiotics may be needed for 10 to 20 days to get rid of an infection. If you can identify what caused the itching, avoid this substance in the future.  Widespread itching  The following measures may help to relieve itching regardless of the cause:   Wash the skin once with soap to remove irritants.  Bathe in tepid water with baking soda, cornstarch, or oatmeal.  Use calamine lotion  (nonprescription) or a baking soda solution (1 teaspoon in 4 ounces of water on the skin).  Apply 1% hydrocortisone cream (no prescription needed). Do not use this if there might be a skin infection.  Avoid scratching.  Avoid itchy or tight-fitting clothes.  Avoid excessive heat, sweating, scented soaps, and swimming pools.  The lubricants, anti-itch medicines, etc. noted above may be helpful for controlling symptoms. SEEK MEDICAL CARE IF:   The itching becomes severe.  Your itch is not better after 1 week of treatment. Contact your caregiver to schedule further evaluation. Document Released: 04/10/2005 Document Revised: 07/03/2011 Document Reviewed: 09/28/2006 [facility] Patient Information 2013 [place].

## 2012-07-18 LAB — HEPATIC FUNCTION PANEL
AST: 19 U/L (ref 0–37)
Alkaline Phosphatase: 88 U/L (ref 39–117)
Total Protein: 6.6 g/dL (ref 6.0–8.3)

## 2012-07-18 LAB — LIPID PANEL
Cholesterol: 238 mg/dL — ABNORMAL HIGH (ref 0–200)
Triglycerides: 498 mg/dL — ABNORMAL HIGH (ref <150)

## 2012-07-18 LAB — LDL CHOLESTEROL, DIRECT: Direct LDL: 127 mg/dL — ABNORMAL HIGH

## 2012-07-18 NOTE — Addendum Note (Signed)
Addended by: Vertell Novak A on: 07/18/2012 09:18 AM   Modules accepted: Orders

## 2012-08-15 ENCOUNTER — Telehealth: Payer: Self-pay | Admitting: *Deleted

## 2012-08-15 NOTE — Telephone Encounter (Addendum)
Pt called stating she saw dermatologist ( Dr Willette Pa ) today and was asked to stop taking her BP meds .  Blood work was done at the [place] and Dr. Willette Pa will call you when report comes back. Pt will continue BP meds until she hears from you. Pt is still itching and having breakouts.  She is requesting medication for her nerves. Pt # [id number]

## 2012-08-16 NOTE — Telephone Encounter (Signed)
I'm not sure what this note means. Without the reason for stopping BP meds I do not have a good answer but I don't currently have a good reason to stop them. Will await the labs from dermatology office.   Dr. Doug Sou

## 2012-08-26 ENCOUNTER — Other Ambulatory Visit: Payer: Self-pay | Admitting: Orthopedic Surgery

## 2012-08-26 DIAGNOSIS — M25562 Pain in left knee: Secondary | ICD-10-CM

## 2012-08-26 DIAGNOSIS — R609 Edema, unspecified: Secondary | ICD-10-CM

## 2012-08-26 DIAGNOSIS — R531 Weakness: Secondary | ICD-10-CM

## 2012-09-03 ENCOUNTER — Ambulatory Visit
Admission: RE | Admit: 2012-09-03 | Discharge: 2012-09-03 | Disposition: A | Discharge status: Home / Self Care | Payer: Medicare Other | Source: Ambulatory Visit | Attending: Orthopedic Surgery | Admitting: Orthopedic Surgery

## 2012-09-03 DIAGNOSIS — R609 Edema, unspecified: Secondary | ICD-10-CM

## 2012-09-03 DIAGNOSIS — M25562 Pain in left knee: Secondary | ICD-10-CM

## 2012-09-03 DIAGNOSIS — R531 Weakness: Secondary | ICD-10-CM

## 2012-09-06 ENCOUNTER — Encounter: Payer: Self-pay | Admitting: Internal Medicine

## 2012-09-06 ENCOUNTER — Ambulatory Visit (INDEPENDENT_AMBULATORY_CARE_PROVIDER_SITE_OTHER): Payer: Medicare Other | Admitting: Internal Medicine

## 2012-09-06 VITALS — BP 150/80 | HR 86 | Temp 97.8°F | Ht 66.0 in | Wt 188.6 lb

## 2012-09-06 DIAGNOSIS — F3289 Other specified depressive episodes: Secondary | ICD-10-CM

## 2012-09-06 DIAGNOSIS — F329 Major depressive disorder, single episode, unspecified: Secondary | ICD-10-CM

## 2012-09-06 DIAGNOSIS — G4700 Insomnia, unspecified: Secondary | ICD-10-CM

## 2012-09-06 DIAGNOSIS — E785 Hyperlipidemia, unspecified: Secondary | ICD-10-CM

## 2012-09-06 DIAGNOSIS — E119 Type 2 diabetes mellitus without complications: Secondary | ICD-10-CM

## 2012-09-06 DIAGNOSIS — K2270 Barrett's esophagus without dysplasia: Secondary | ICD-10-CM

## 2012-09-06 DIAGNOSIS — F419 Anxiety disorder, unspecified: Secondary | ICD-10-CM

## 2012-09-06 DIAGNOSIS — F411 Generalized anxiety disorder: Secondary | ICD-10-CM

## 2012-09-06 DIAGNOSIS — E1149 Type 2 diabetes mellitus with other diabetic neurological complication: Secondary | ICD-10-CM

## 2012-09-06 DIAGNOSIS — I10 Essential (primary) hypertension: Secondary | ICD-10-CM

## 2012-09-06 DIAGNOSIS — L309 Dermatitis, unspecified: Secondary | ICD-10-CM

## 2012-09-06 DIAGNOSIS — E1142 Type 2 diabetes mellitus with diabetic polyneuropathy: Secondary | ICD-10-CM

## 2012-09-06 DIAGNOSIS — L259 Unspecified contact dermatitis, unspecified cause: Secondary | ICD-10-CM

## 2012-09-06 LAB — GLUCOSE, CAPILLARY: Glucose-Capillary: 100 mg/dL — ABNORMAL HIGH (ref 70–99)

## 2012-09-06 MED ORDER — AMLODIPINE BESYLATE 5 MG PO TABS: 5.0000 mg | ORAL_TABLET | Freq: Every day | ORAL | Status: DC

## 2012-09-06 MED ORDER — ALPRAZOLAM 0.5 MG PO TABS: 0.5000 mg | ORAL_TABLET | Freq: Two times a day (BID) | ORAL | Status: DC | PRN

## 2012-09-06 NOTE — Assessment & Plan Note (Signed)
She did see dermatology and seems to be resolving. Advised her to trim her nails as they are fairly long and are likely introducing bacteria to the skin as she is itching incessantly. She claims she is scratching less frequently and this still seems associated with stress.

## 2012-09-06 NOTE — Assessment & Plan Note (Signed)
BP Readings from Last 3 Encounters:  09/06/12 150/80  07/17/12 140/70  06/11/12 141/88    Lab Results  Component Value Date   NA 141 06/11/2012   K 4.5 06/11/2012   CREATININE 1.31* 06/11/2012    Assessment: Blood pressure control: mildly elevated Progress toward BP goal:  unchanged Comments: add new medicine amlodipine  Plan: Medications:  continue current medications, HCTZ 25 mg daily, lisinopril 40 mg daily and add amlodipine 5 mg daily Educational resources provided: video Self management tools provided: instructions for home blood pressure monitoring Other plans:

## 2012-09-06 NOTE — Patient Instructions (Signed)
General Instructions:  We will add a new blood pressure medicine called amlodipine or norvasc. Take 1 pill a day, either morning OR afternoon. Whichever is easier to remember.   We will see you back in 6-8 weeks to check your blood pressure.  Consider going to a counselor to talk to them about your stress.   We will send you back to Dr. Sharlett Iles to check on your barrett's esophagus.   Good luck with your knee surgery!  Call us with questions or problems at [phone].  Treatment Goals:  Goals (1 Years of Data) as of 09/06/12         As of Today As of Today 07/17/12 07/17/12 06/11/12     Blood Pressure    . Blood Pressure < 140/90  150/80 152/99 140/70 144/88 141/88     Diet    . Increase water intake- especially when blood sugars are high           Result Component    . HEMOGLOBIN A1C < 7.0  6.6        . LDL CALC < 100            Progress Toward Treatment Goals:  Treatment Goal 09/06/2012  Hemoglobin A1C at goal  Blood pressure unchanged  Stop smoking smoking the same amount    Self Care Goals & Plans:  Self Care Goal 09/06/2012  Manage my medications take my medicines as prescribed  Monitor my health keep track of my blood glucose  Eat healthy foods eat more vegetables  Be physically active find an activity I enjoy  Stop smoking go to the [employer] (https://[employer]/)  Meeting treatment goals maintain the current self-care plan    Home Blood Glucose Monitoring 09/06/2012  Check my blood sugar 4 times a day  When to check my blood sugar before meals; at bedtime     Care Management & Community Referrals:  Referral 09/06/2012  Referrals made for care management support none needed  Referrals made to community resources -

## 2012-09-06 NOTE — Assessment & Plan Note (Signed)
Related to stress and advised relaxing activities and taking time for herself.

## 2012-09-06 NOTE — Assessment & Plan Note (Signed)
Last EGD back in 2012 and will send in referral for her to see Dr. Sharlett Iles back for repeat EGD as she is overdue.

## 2012-09-06 NOTE — Progress Notes (Signed)
Subjective:     Patient ID: [name], female   DOB: November 02, 1945, 67 y.o.   MRN: [id number]  HPI The patient is a 67 YO woman who is here for a follow up of her rash and her diabetes and other health maintenance. She has history of well controlled DM II, hyperlipidemia, rash with itching and some CKD which was stage III when last evaluated. She is still having the rash although she did see a dermatologist in the time since our last visit. She was tried on 2 weeks of doxycycline and seems to have some good improvement. She does still itch on occasion and does see some irritation after itching. She is still having significant stress with her neighbor who is on dialysis and needs lots of help. The patient did tell her that she will need a break and apparently the lady has been throwing tantrums and is requiring lots of help and increased stress. She has never tried counseling and does not have any de-stressing activities that she practices.   Review of Systems  Constitutional: Negative for fever, chills, diaphoresis, activity change, appetite change, fatigue and unexpected weight change.  HENT: Negative.   Eyes: Negative.   Respiratory: Negative for cough, chest tightness, shortness of breath and wheezing.   Cardiovascular: Negative for chest pain, palpitations and leg swelling.  Gastrointestinal: Negative for nausea, vomiting, abdominal pain, diarrhea and constipation.  Musculoskeletal: Positive for arthralgias. Negative for myalgias, back pain, joint swelling and gait problem.       Minimal shoulder pain with increased activity  Skin: Positive for rash. Negative for color change, pallor and wound.       Rash improved  Neurological: Negative for dizziness, tremors, seizures, syncope, facial asymmetry, speech difficulty, weakness, light-headedness, numbness and headaches.  Psychiatric/Behavioral: Positive for sleep disturbance. Negative for suicidal ideas, hallucinations, behavioral problems,  confusion, self-injury, dysphoric mood, decreased concentration and agitation. The patient is not nervous/anxious and is not hyperactive.        Related to stress       Objective:   Physical Exam  Constitutional: She is oriented to person, place, and time. She appears well-developed and well-nourished.  Obese  HENT:  Head: Normocephalic and atraumatic.  Eyes: EOM are normal. Pupils are equal, round, and reactive to light.  Neck: Normal range of motion. Neck supple. No JVD present. No thyromegaly present.  Cardiovascular: Normal rate and regular rhythm.   Pulmonary/Chest: Effort normal and breath sounds normal. No respiratory distress. She has no wheezes. She has no rales. She exhibits no tenderness.  Abdominal: Soft. Bowel sounds are normal. She exhibits no distension. There is no rebound and no guarding.  Musculoskeletal: Normal range of motion. She exhibits no edema and no tenderness.  Neurological: She is alert and oriented to person, place, and time. No cranial nerve deficit.  Skin: Skin is warm and dry. Rash noted.  Some dark spots where she has been scratching, no bumps or skin breakdown. Dark patches on legs lateral shin, two spots on the stomach where she injects her insulin. Overall looks improved from last visit especially on her legs.        Assessment:   1. Please see problem oriented charting.  2. Disposition - The patient will be seen back in 6-8 weeks. Her diabetes is still under good control and she will be sent back to GI to check on her barrett's esophagus. She is going to have knee surgery in 1 month and she was advised to stop  smoking. She was reminded of the need to trim her nails to avoid scratching the skin and re-introducing new bacteria to the area. She was advised that counseling may be helpful to her nerves and xanax was refilled 0.5 mg #60 no refills BID prn. She was given new rx for amlodipine 5 mg daily for her BP and recheck in 6-8 weeks.

## 2012-09-06 NOTE — Assessment & Plan Note (Signed)
Will not change therapy at this visit due to patient preference and advised weight loss and smoking cessation.

## 2012-09-06 NOTE — Assessment & Plan Note (Signed)
Lab Results  Component Value Date   HGBA1C 6.6 09/06/2012   HGBA1C 6.0 04/30/2012   HGBA1C 6.4 01/12/2012     Assessment: Diabetes control: good control (HgbA1C at goal) Progress toward A1C goal:  at goal Comments:   Plan: Medications:  continue current medications, metformin 1000 mg BID, novolog 70/30 10 units BID Home glucose monitoring: Frequency: 4 times a day Timing: before meals;at bedtime Instruction/counseling given: reminded to bring blood glucose meter & log to each visit, discussed foot care and discussed the need for weight loss Educational resources provided: brochure Self management tools provided: copy of home glucose meter download Other plans:

## 2012-09-06 NOTE — Assessment & Plan Note (Signed)
Continue prozac, xanax and advised counseling.

## 2012-09-07 LAB — MICROALBUMIN / CREATININE URINE RATIO
Creatinine, Urine: 297.8 mg/dL
Microalb, Ur: 271.58 mg/dL — ABNORMAL HIGH (ref 0.00–1.89)

## 2012-09-09 NOTE — Progress Notes (Signed)
INTERNAL MEDICINE TEACHING ATTENDING ADDENDUM: I discussed this case with Dr. Doug Sou soon after the patient visit. I have read the documentation and I agree with the plan of care. Please see the resident note for details of management.

## 2012-10-03 ENCOUNTER — Telehealth: Payer: Self-pay | Admitting: *Deleted

## 2012-10-03 NOTE — Telephone Encounter (Signed)
Dr dean's office calls for latest lab results, faxed to [phone], ph [phone]

## 2012-10-18 ENCOUNTER — Ambulatory Visit (INDEPENDENT_AMBULATORY_CARE_PROVIDER_SITE_OTHER): Payer: Medicare Other | Admitting: Internal Medicine

## 2012-10-18 ENCOUNTER — Encounter: Payer: Self-pay | Admitting: Internal Medicine

## 2012-10-18 VITALS — BP 122/82 | HR 86 | Temp 97.8°F | Ht 66.0 in | Wt 186.6 lb

## 2012-10-18 DIAGNOSIS — F17200 Nicotine dependence, unspecified, uncomplicated: Secondary | ICD-10-CM | POA: Insufficient documentation

## 2012-10-18 DIAGNOSIS — E1149 Type 2 diabetes mellitus with other diabetic neurological complication: Secondary | ICD-10-CM

## 2012-10-18 DIAGNOSIS — I10 Essential (primary) hypertension: Secondary | ICD-10-CM

## 2012-10-18 DIAGNOSIS — Z720 Tobacco use: Secondary | ICD-10-CM | POA: Insufficient documentation

## 2012-10-18 DIAGNOSIS — E1142 Type 2 diabetes mellitus with diabetic polyneuropathy: Secondary | ICD-10-CM

## 2012-10-18 DIAGNOSIS — G4700 Insomnia, unspecified: Secondary | ICD-10-CM

## 2012-10-18 DIAGNOSIS — F329 Major depressive disorder, single episode, unspecified: Secondary | ICD-10-CM

## 2012-10-18 NOTE — Patient Instructions (Addendum)
General Instructions: The blood pressure medicine is working well for you. Continue to take it as instructed. We have refilled your Ambien, Xana and Prozac today. Resume taking your diabetes medicines as before your knee surgery. Continue to try and work towards not smoking. Call the 1-800-QUIT-NOW line for assistance if you desire.  Treatment Goals:  Goals (1 Years of Data) as of 10/18/12         As of Today 09/06/12 09/06/12 07/17/12 07/17/12     Blood Pressure    . Blood Pressure < 140/90  122/82 150/80 152/99 140/70 144/88     Diet    . Increase water intake- especially when blood sugars are high           Result Component    . HEMOGLOBIN A1C < 7.0   6.6       . LDL CALC < 100            Progress Toward Treatment Goals:  Treatment Goal 10/18/2012  Hemoglobin A1C at goal  Blood pressure at goal  Stop smoking smoking less    Self Care Goals & Plans:  Self Care Goal 10/18/2012  Manage my medications take my medicines as prescribed  Monitor my health keep track of my blood glucose  Eat healthy foods -  Be physically active -  Stop smoking -  Meeting treatment goals -    Home Blood Glucose Monitoring 10/18/2012  Check my blood sugar 2 times a day  When to check my blood sugar before meals     Care Management & Community Referrals:  Referral 09/06/2012  Referrals made for care management support none needed  Referrals made to community resources -

## 2012-10-18 NOTE — Assessment & Plan Note (Addendum)
Lab Results  Component Value Date   HGBA1C 6.6 09/06/2012   HGBA1C 6.0 04/30/2012   HGBA1C 6.4 01/12/2012     Assessment: Diabetes control: good control (HgbA1C at goal) Progress toward A1C goal:  at goal Comments:states that she had not been taking it as prescribed bc of recent surgery  Plan: Medications:  continue current medications Home glucose monitoring: Frequency: 2 times a day Timing: before meals Instruction/counseling given: reminded to get eye exam Educational resources provided:   Self management tools provided: copy of home glucose meter download Other plans: plans to have record sent from Dr. [name] Fitch (Eye), will do foot exam today

## 2012-10-18 NOTE — Assessment & Plan Note (Signed)
BP Readings from Last 3 Encounters:  10/18/12 122/82  09/06/12 150/80  07/17/12 140/70    Lab Results  Component Value Date   NA 141 06/11/2012   K 4.5 06/11/2012   CREATININE 1.31* 06/11/2012    Assessment: Blood pressure control: controlled Progress toward BP goal:  at goal Comments: cont Amlodipine5 mg qd  Plan: Medications:  continue current medications Educational resources provided:   Self management tools provided: home blood pressure logbook Other plans: cont to monitor on amlodipine 5 mg qd

## 2012-10-18 NOTE — Assessment & Plan Note (Signed)
Stable, refilled ambien.  

## 2012-10-18 NOTE — Assessment & Plan Note (Signed)
  Assessment: Progress toward smoking cessation:  smoking less Barriers to progress toward smoking cessation:  stress Comments: not motivated today, states neighbor creates much stress for her  Plan: Instruction/counseling given:  I commended patient for quitting and reviewed strategies for preventing relapses. Educational resources provided:  smoking cessation handout (tips, strategies, fact sheets) Self management tools provided:  other (see comments) Medications to assist with smoking cessation:  None Patient agreed to the following self-care plans for smoking cessation:    Other plans: decline cessation therapy at this point

## 2012-10-18 NOTE — Progress Notes (Signed)
Case discussed with Dr. Schooler soon after the resident saw the patient.  We reviewed the resident's history and exam and pertinent patient test results.  I agree with the assessment, diagnosis, and plan of care documented in the resident's note. 

## 2012-10-18 NOTE — Assessment & Plan Note (Addendum)
Stable, refilled prozac and xanax

## 2012-10-18 NOTE — Progress Notes (Signed)
  Subjective:    Patient ID: [name], female    DOB: 1945-05-13, 67 y.o.   MRN: [id number]  HPI  Pt presents today for bp check after being started on amlodipine 5 mg qd ~ 6 weeks ago at whoch time her sbp was 150s.  Today her bp is 122/82 pulse 86 bpm.  She denies and side effects and is without complaints today including no chest pain, sob, myalgia, nausea or change in bowel habits.  She does not that she still has a small "knot: on her right leg that was evaluated by Dr. Doug Sou at her last PCP visit.  She had knee surgery June 16 per Dr. Marlou Sa of [facility]. Of note, she states that she has been decreasing her tobacco use and that a pack of cigarettes will last "about a week".  Review of Systems  Constitutional: Negative for fever and fatigue.  HENT: Negative for congestion.   Eyes: Negative for visual disturbance.  Respiratory: Negative for cough and shortness of breath.   Cardiovascular: Negative for chest pain, palpitations and leg swelling.  Gastrointestinal: Negative for nausea, vomiting, diarrhea and constipation.  Musculoskeletal: Negative for joint swelling and gait problem.       Reports improvement in knee pain since surgery and has better range of motion  Neurological: Negative for weakness, light-headedness and numbness.       Objective:   Physical Exam  Constitutional: She is oriented to person, place, and time. She appears well-developed and well-nourished. No distress.  HENT:  Head: Normocephalic and atraumatic.  Eyes: Conjunctivae and EOM are normal. Pupils are equal, round, and reactive to light.  Neck: Normal range of motion. Neck supple.  Cardiovascular: Normal rate, regular rhythm, normal heart sounds and intact distal pulses.   Pulmonary/Chest: Effort normal and breath sounds normal.  Musculoskeletal: Normal range of motion. She exhibits no edema and no tenderness.  Neurological: She is alert and oriented to person, place, and time.  Skin: Skin  is warm and dry. No erythema.  Scattered hyperpigment areas on bilateral LE  Psychiatric: She has a normal mood and affect.          Assessment & Plan:  Please see detailed problem list #1 hypertension: at goal on amlodipine 5 mg, cont current therapy #2 DM, Type: controlled, HgbA1c in 6's last evaluation, cont Novolog 70/30 10 units bid, metformin 1000 mg bid -foot exam today #3 tobacco abuse: counseled for cessation #4 anxiety and depression: stable -xanax and prozac refilled #5 insomnia: stable -ambien refilled  #6 Dispo: to follow-up with Dr. Doug Sou in 2-3 months

## 2012-10-31 ENCOUNTER — Other Ambulatory Visit: Payer: Self-pay

## 2012-11-21 ENCOUNTER — Ambulatory Visit (INDEPENDENT_AMBULATORY_CARE_PROVIDER_SITE_OTHER): Payer: Medicare Other | Admitting: Internal Medicine

## 2012-11-21 ENCOUNTER — Encounter: Payer: Self-pay | Admitting: Internal Medicine

## 2012-11-21 VITALS — BP 120/70 | HR 86 | Temp 97.5°F | Ht 66.0 in | Wt 188.9 lb

## 2012-11-21 DIAGNOSIS — E1149 Type 2 diabetes mellitus with other diabetic neurological complication: Secondary | ICD-10-CM

## 2012-11-21 DIAGNOSIS — E1142 Type 2 diabetes mellitus with diabetic polyneuropathy: Secondary | ICD-10-CM

## 2012-11-21 DIAGNOSIS — F329 Major depressive disorder, single episode, unspecified: Secondary | ICD-10-CM

## 2012-11-21 DIAGNOSIS — E119 Type 2 diabetes mellitus without complications: Secondary | ICD-10-CM

## 2012-11-21 DIAGNOSIS — I10 Essential (primary) hypertension: Secondary | ICD-10-CM

## 2012-11-21 LAB — GLUCOSE, CAPILLARY: Glucose-Capillary: 213 mg/dL — ABNORMAL HIGH (ref 70–99)

## 2012-11-21 MED ORDER — FLUOXETINE HCL 40 MG PO CAPS: 40.0000 mg | ORAL_CAPSULE | Freq: Every day | ORAL | Status: DC
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
## 2012-11-21 NOTE — Patient Instructions (Addendum)
Thank you for your visit - I placed a referral to [facility], our clinical Education officer, museum. She will give you a call about options for helping your elderly neighbor. - It is very, very important for you to take care of yourself. Please take your medications as prescribed, especially your insulin. Your blood sugar was high today. Your primary care doctor will talk to you more about this at your visit in 2 weeks. - I have reordered your Prozac as she requested. Please take this medicine everyday. - Please followup at your next appointment, it is August 15.

## 2012-11-22 ENCOUNTER — Telehealth: Payer: Self-pay | Admitting: Licensed Clinical Social Worker

## 2012-11-22 LAB — URINALYSIS, ROUTINE W REFLEX MICROSCOPIC
Nitrite: NEGATIVE
Protein, ur: >300 mg/dL — AB
Urobilinogen, UA: 0.2 mg/dL (ref 0.0–1.0)

## 2012-11-22 LAB — URINALYSIS, MICROSCOPIC ONLY

## 2012-11-22 NOTE — Assessment & Plan Note (Addendum)
Lab Results  Component Value Date   HGBA1C 6.6 09/06/2012   HGBA1C 6.0 04/30/2012   HGBA1C 6.4 01/12/2012     Assessment: Diabetes control:  Deteriorated in the setting of depression and social stressors (CBG 213 today) Progress toward A1C goal:   Unchanged  Plan: Medications:  continue current medications Instruction/counseling given: reminded to bring blood glucose meter & log to each visit and reminded to bring medications to each visit Other plans: Patient is not being compliant with insulin regimen because of psychosocial stressors.  - Reiterated the importance of caring for herself when she's caring for other people - Close follow up in 2 weeks - Repeating UA to r/o glomerulonephritis as cause of her proteinuria (she had some RBCs on prior UA)

## 2012-11-22 NOTE — Assessment & Plan Note (Signed)
BP Readings from Last 3 Encounters:  11/21/12 120/70  10/18/12 122/82  09/06/12 150/80    Lab Results  Component Value Date   NA 141 06/11/2012   K 4.5 06/11/2012   CREATININE 1.31* 06/11/2012    Assessment: Blood pressure control:  Good Progress toward BP goal:   At goal  Plan: Medications:  continue current medications
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
## 2012-11-26 NOTE — Progress Notes (Signed)
I saw and evaluated the patient.  I personally confirmed the key portions of the history and exam documented by Dr. Cater and I reviewed pertinent patient test results.  The assessment, diagnosis, and plan were formulated together and I agree with the documentation in the resident's note. 

## 2012-12-06 ENCOUNTER — Ambulatory Visit (INDEPENDENT_AMBULATORY_CARE_PROVIDER_SITE_OTHER): Payer: Medicare Other | Admitting: Internal Medicine

## 2012-12-06 ENCOUNTER — Encounter: Payer: Self-pay | Admitting: Internal Medicine

## 2012-12-06 VITALS — BP 136/88 | HR 84 | Temp 97.6°F | Ht 63.5 in | Wt 187.8 lb

## 2012-12-06 DIAGNOSIS — E1142 Type 2 diabetes mellitus with diabetic polyneuropathy: Secondary | ICD-10-CM

## 2012-12-06 DIAGNOSIS — I10 Essential (primary) hypertension: Secondary | ICD-10-CM

## 2012-12-06 DIAGNOSIS — Z720 Tobacco use: Secondary | ICD-10-CM

## 2012-12-06 DIAGNOSIS — B029 Zoster without complications: Secondary | ICD-10-CM

## 2012-12-06 DIAGNOSIS — F3289 Other specified depressive episodes: Secondary | ICD-10-CM

## 2012-12-06 DIAGNOSIS — F329 Major depressive disorder, single episode, unspecified: Secondary | ICD-10-CM

## 2012-12-06 DIAGNOSIS — F17200 Nicotine dependence, unspecified, uncomplicated: Secondary | ICD-10-CM

## 2012-12-06 DIAGNOSIS — F411 Generalized anxiety disorder: Secondary | ICD-10-CM

## 2012-12-06 DIAGNOSIS — F419 Anxiety disorder, unspecified: Secondary | ICD-10-CM

## 2012-12-06 DIAGNOSIS — E1149 Type 2 diabetes mellitus with other diabetic neurological complication: Secondary | ICD-10-CM

## 2012-12-06 DIAGNOSIS — Z0000 Encounter for general adult medical examination without abnormal findings: Secondary | ICD-10-CM

## 2012-12-06 LAB — GLUCOSE, CAPILLARY: Glucose-Capillary: 94 mg/dL (ref 70–99)

## 2012-12-06 MED ORDER — PAROXETINE HCL 20 MG PO TABS: 20.0000 mg | ORAL_TABLET | Freq: Every day | ORAL | Status: DC

## 2012-12-06 MED ORDER — ALPRAZOLAM 0.5 MG PO TABS: 0.5000 mg | ORAL_TABLET | Freq: Two times a day (BID) | ORAL | Status: DC | PRN

## 2012-12-06 MED ORDER — ZOSTER VACCINE LIVE 19400 UNT/0.65ML ~~LOC~~ SOLR: 0.6500 mL | Freq: Once | SUBCUTANEOUS | Status: DC

## 2012-12-06 NOTE — Patient Instructions (Addendum)
General Instructions:  You are doing well with your sugars! Keep up the good work.  We will switch your prozac to paxil. Take 1 pill per day and it may take 4 weeks to feel the full effect.   We will have you get the shingles shot (call a pharmacy first to find out how much it will cost) and will have you get a bone density scan.   Come back in 6 weeks to check on your mood.   Treatment Goals:  Goals (1 Years of Data) as of 12/06/12         As of Today 11/21/12 10/18/12 09/06/12 09/06/12     Blood Pressure    . Blood Pressure < 140/90  136/88 120/70 122/82 150/80 152/99     Diet    . Increase water intake- especially when blood sugars are high           Result Component    . HEMOGLOBIN A1C < 7.0  6.7   6.6     . LDL CALC < 100            Progress Toward Treatment Goals:  Treatment Goal 12/06/2012  Hemoglobin A1C at goal  Blood pressure at goal  Stop smoking smoking the same amount    Self Care Goals & Plans:  Self Care Goal 12/06/2012  Manage my medications take my medicines as prescribed; bring my medications to every visit; refill my medications on time; follow the sick day instructions if I am sick  Monitor my health keep track of my blood glucose; bring my glucose meter and log to each visit; keep track of my blood pressure; check my feet daily  Eat healthy foods eat more vegetables; eat fruit for snacks and desserts; eat smaller portions; drink diet soda or water instead of juice or soda  Be physically active find an activity I enjoy; take a walk every day  Stop smoking -  Meeting treatment goals -    Home Blood Glucose Monitoring 12/06/2012  Check my blood sugar 4 times a day  When to check my blood sugar before meals; after dinner     Care Management & Community Referrals:  Referral 09/06/2012  Referrals made for care management support none needed  Referrals made to community resources -

## 2012-12-07 NOTE — Assessment & Plan Note (Signed)
Since prozac is not working well will switch to paxil and advised her to follow up in 6-8 weeks to check on her mood. Advised her that if she has worsening of her mood or SI/HI to call or seek medical help immediately.

## 2012-12-07 NOTE — Progress Notes (Signed)
Subjective:     Patient ID: [name], female   DOB: Nov 25, 1945, 67 y.o.   MRN: [id number]  HPI  The patient is a 67 YO woman who is here for a follow up of her rash and her diabetes and other health maintenance. She has history of well controlled DM II, hyperlipidemia, rash with itching and some CKD which was stage III when last evaluated. The rash is much better and she saw a dermatologist and was told to return but did not return. She is having some area of scarring on her knee where arthroscopy was performed and she wonders if that skin will again look like her other skin (it is slightly thicker and darker). She is still having significant stress with her neighbor who is on dialysis and needs lots of help. She is still on prozac but thinks that it is not helping much. She is still prone to mood swings and not sleeping well. She is doing better with her mother who is not asserting herself more to her son (the patient's brother) and this takes a lot of the stress off the patient. She has never tried counseling and does not have any de-stressing activities that she practices. She denies chest pain, SOB, nausea, vomiting, fevers, chills. She states that she was off of her insulin for a little with the stress but is not doing much better. She has not had any hypoglycemic episodes or hyperglycemic episodes since our last visit.   Review of Systems  Constitutional: Negative for fever, chills, diaphoresis, activity change, appetite change, fatigue and unexpected weight change.  HENT: Negative.   Eyes: Negative.   Respiratory: Negative for cough, chest tightness, shortness of breath and wheezing.   Cardiovascular: Negative for chest pain, palpitations and leg swelling.  Gastrointestinal: Negative for nausea, vomiting, abdominal pain, diarrhea and constipation.  Musculoskeletal: Positive for arthralgias. Negative for myalgias, back pain, joint swelling and gait problem.       Minimal shoulder pain with  increased activity  Skin: Positive for rash. Negative for color change, pallor and wound.       Rash improved  Neurological: Negative for dizziness, tremors, seizures, syncope, facial asymmetry, speech difficulty, weakness, light-headedness, numbness and headaches.  Psychiatric/Behavioral: Positive for sleep disturbance. Negative for suicidal ideas, hallucinations, behavioral problems, confusion, self-injury, dysphoric mood, decreased concentration and agitation. The patient is not nervous/anxious and is not hyperactive.        Related to stress       Objective:   Physical Exam  Constitutional: She is oriented to person, place, and time. She appears well-developed and well-nourished.  Obese  HENT:  Head: Normocephalic and atraumatic.  Eyes: EOM are normal. Pupils are equal, round, and reactive to light.  Neck: Normal range of motion. Neck supple. No JVD present. No thyromegaly present.  Cardiovascular: Normal rate and regular rhythm.   Pulmonary/Chest: Effort normal and breath sounds normal. No respiratory distress. She has no wheezes. She has no rales. She exhibits no tenderness.  Abdominal: Soft. Bowel sounds are normal. She exhibits no distension. There is no rebound and no guarding.  Musculoskeletal: Normal range of motion. She exhibits no edema and no tenderness.  Neurological: She is alert and oriented to person, place, and time. No cranial nerve deficit.  Skin: Skin is warm and dry. Rash noted.  Some dark spots where she had arthroscopy on her knee (left)       Assessment:   1. Please see problem oriented charting.  2. Disposition -  The patient will be seen back in 6-8 weeks to check on her mood. Her diabetes is still under good control. She will go back to dermatology. She was advised that her scars will not go away and she will likely have them the rest of her life. STOP prozac and trial paxil and xanax was refilled 0.5 mg #60 no refills BID prn.  She was given rx for zostavax  and bone density scan as she had never had one. Did foot exam, HgA1c at today's visit.

## 2012-12-07 NOTE — Assessment & Plan Note (Signed)
Lab Results  Component Value Date   HGBA1C 6.7 12/06/2012   HGBA1C 6.6 09/06/2012   HGBA1C 6.0 04/30/2012     Assessment: Diabetes control: good control (HgbA1C at goal) Progress toward A1C goal:  at goal Comments: good control  Plan: Medications:  continue current medications, metformin 1000 mg bid, insulin 70/30 10 units bid Home glucose monitoring: Frequency: 4 times a day Timing: before meals;after dinner Instruction/counseling given: reminded to get eye exam Educational resources provided: brochure;handout Self management tools provided: instructions for home glucose monitoring Other plans: foot exam performed, HgA1c performed

## 2012-12-07 NOTE — Assessment & Plan Note (Signed)
  Assessment: Progress toward smoking cessation:  smoking the same amount Barriers to progress toward smoking cessation:  lack of motivation to quit;stress Comments: she smokes with stress and thinks it relieves her anxiety. She was advised the nicotine withdrawal can create anxiety and that her anxiety levels might decrease if she stopped entirely.  Plan: Instruction/counseling given:  I counseled patient on the dangers of tobacco use, advised patient to stop smoking, and reviewed strategies to maximize success. Educational resources provided:  [employer] account manager) brochure Self management tools provided:    Medications to assist with smoking cessation:  None Patient agreed to the following self-care plans for smoking cessation:    Other plans:

## 2012-12-07 NOTE — Assessment & Plan Note (Signed)
BP Readings from Last 3 Encounters:  12/06/12 136/88  11/21/12 120/70  10/18/12 122/82    Lab Results  Component Value Date   NA 141 06/11/2012   K 4.5 06/11/2012   CREATININE 1.31* 06/11/2012    Assessment: Blood pressure control: controlled Progress toward BP goal:  at goal Comments:   Plan: Medications:  continue current medications, amlodipine 5 mg, hctz 25 mg, lisinopril 40 mg daily Educational resources provided: brochure;handout;video Self management tools provided: instructions for home blood pressure monitoring Other plans:

## 2012-12-07 NOTE — Assessment & Plan Note (Signed)
Patient advised that she can still get zoster vaccine even though she has already had zoster and given rx for shot.

## 2012-12-10 NOTE — Progress Notes (Signed)
Case discussed with Dr. Kollar soon after the resident saw the patient.  We reviewed the resident's history and exam and pertinent patient test results.  I agree with the assessment, diagnosis, and plan of care documented in the resident's note. 

## 2012-12-19 ENCOUNTER — Other Ambulatory Visit: Payer: Self-pay | Admitting: Internal Medicine

## 2012-12-19 ENCOUNTER — Ambulatory Visit (HOSPITAL_COMMUNITY)
Admission: RE | Admit: 2012-12-19 | Discharge: 2012-12-19 | Disposition: A | Discharge status: Home / Self Care | Payer: Medicare Other | Source: Ambulatory Visit | Attending: Internal Medicine | Admitting: Internal Medicine

## 2012-12-19 DIAGNOSIS — Z0000 Encounter for general adult medical examination without abnormal findings: Secondary | ICD-10-CM

## 2012-12-19 DIAGNOSIS — Z780 Asymptomatic menopausal state: Secondary | ICD-10-CM | POA: Insufficient documentation

## 2012-12-19 DIAGNOSIS — Z13820 Encounter for screening for osteoporosis: Secondary | ICD-10-CM | POA: Insufficient documentation

## 2012-12-31 ENCOUNTER — Other Ambulatory Visit: Payer: Self-pay | Admitting: Internal Medicine

## 2013-01-03 ENCOUNTER — Ambulatory Visit (INDEPENDENT_AMBULATORY_CARE_PROVIDER_SITE_OTHER): Payer: Medicare Other | Admitting: Internal Medicine

## 2013-01-03 ENCOUNTER — Encounter: Payer: Self-pay | Admitting: Internal Medicine

## 2013-01-03 VITALS — BP 145/87 | HR 107 | Temp 97.5°F | Ht 64.0 in | Wt 190.5 lb

## 2013-01-03 DIAGNOSIS — F411 Generalized anxiety disorder: Secondary | ICD-10-CM

## 2013-01-03 DIAGNOSIS — E119 Type 2 diabetes mellitus without complications: Secondary | ICD-10-CM

## 2013-01-03 DIAGNOSIS — F419 Anxiety disorder, unspecified: Secondary | ICD-10-CM

## 2013-01-03 DIAGNOSIS — E1142 Type 2 diabetes mellitus with diabetic polyneuropathy: Secondary | ICD-10-CM

## 2013-01-03 DIAGNOSIS — Z299 Encounter for prophylactic measures, unspecified: Secondary | ICD-10-CM

## 2013-01-03 DIAGNOSIS — F329 Major depressive disorder, single episode, unspecified: Secondary | ICD-10-CM

## 2013-01-03 DIAGNOSIS — L309 Dermatitis, unspecified: Secondary | ICD-10-CM

## 2013-01-03 DIAGNOSIS — E1149 Type 2 diabetes mellitus with other diabetic neurological complication: Secondary | ICD-10-CM

## 2013-01-03 DIAGNOSIS — L259 Unspecified contact dermatitis, unspecified cause: Secondary | ICD-10-CM

## 2013-01-03 DIAGNOSIS — Z23 Encounter for immunization: Secondary | ICD-10-CM

## 2013-01-03 DIAGNOSIS — I10 Essential (primary) hypertension: Secondary | ICD-10-CM

## 2013-01-03 MED ORDER — PAROXETINE HCL 20 MG PO TABS
30.0000 mg | ORAL_TABLET | Freq: Every day | ORAL | Status: DC
Start: 2013-01-03 — End: 2013-04-04

## 2013-01-03 MED ORDER — ALPRAZOLAM 0.5 MG PO TABS: 0.5000 mg | ORAL_TABLET | Freq: Two times a day (BID) | ORAL | Status: DC | PRN

## 2013-01-03 MED ORDER — INSULIN ASPART PROT & ASPART (70-30 MIX) 100 UNIT/ML ~~LOC~~ SUSP: 10.0000 [IU] | Freq: Two times a day (BID) | SUBCUTANEOUS | Status: DC

## 2013-01-03 MED ORDER — PRAVASTATIN SODIUM 40 MG PO TABS: 40.0000 mg | ORAL_TABLET | Freq: Every day | ORAL | Status: DC

## 2013-01-03 NOTE — Patient Instructions (Signed)
We will increase your paxil to 1.5 tablets per day for 1 week. Then increase to taking 2 pills per day until you return.   We will see you back in 3-4 months. Please go see the dermatologist and call back with any questions or problems at [phone].  Itching Itching is a symptom that can be caused by many things. These include skin problems (including infections) as well as some internal diseases.  If the itching is affecting just one area of the body, it is most likely due to a common skin problem, such as:  Poison oak and poison ivy.  Contact dermatitis (skin irritation from a plant, chemicals, fiberglass, detergents, new cosmetic, new jewelry, or other substance).  Fungus (such as athlete's foot, jock itch, or ringworm).  Head lice  Dandruff  Insect bite  Infection (such as Shingles or other virus infections). If the itching is all over (widespread), the possible causes are many. These include:   Dry skin or eczema  Heat rash  Hives  Liver disorders  Kidney disorders TREATMENT  Localized itching   Lubrication of the skin. Use an ointment or cream or other unperfumed moisturizers if the skin is dry. Apply frequently, especially after bathing.  Anti-itch medicines. These medications may help control the urge to scratch. Scratching always makes itching worse and increases the chance of getting an infection.  Cortisone creams and ointments. These help reduce the inflammation.  Antibiotics. Skin infections can cause itching. Topical or oral antibiotics may be needed for 10 to 20 days to get rid of an infection. If you can identify what caused the itching, avoid this substance in the future.  Widespread itching  The following measures may help to relieve itching regardless of the cause:   Wash the skin once with soap to remove irritants.  Bathe in tepid water with baking soda, cornstarch, or oatmeal.  Use calamine lotion (nonprescription) or a baking soda solution (1  teaspoon in 4 ounces of water on the skin).  Apply 1% hydrocortisone cream (no prescription needed). Do not use this if there might be a skin infection.  Avoid scratching.  Avoid itchy or tight-fitting clothes.  Avoid excessive heat, sweating, scented soaps, and swimming pools.  The lubricants, anti-itch medicines, etc. noted above may be helpful for controlling symptoms. SEEK MEDICAL CARE IF:   The itching becomes severe.  Your itch is not better after 1 week of treatment. Contact your caregiver to schedule further evaluation. Document Released: 04/10/2005 Document Revised: 07/03/2011 Document Reviewed: 09/28/2006 [facility] Patient Information 2014 [place].

## 2013-01-05 NOTE — Assessment & Plan Note (Signed)
Increasing paxil from 20 mg daily to 30 mg daily for 1 week then 40 mg daily until she returns.

## 2013-01-05 NOTE — Assessment & Plan Note (Signed)
Chronic and fairly stable. She was advised to go back to see dermatology and they will see her in October. She does still have some cream leftover from them and will use that until she can get back to see them.

## 2013-01-05 NOTE — Assessment & Plan Note (Signed)
Lab Results  Component Value Date   HGBA1C 6.7 12/06/2012   HGBA1C 6.6 09/06/2012   HGBA1C 6.0 04/30/2012     Assessment: Diabetes control:  No change Progress toward A1C goal:   At goal Comments: She had one hypoglycemic episode which she reacted to appropriately and educated regarding eating right after taking insulin.   Plan: Medications:  continue current medications Home glucose monitoring: Frequency:   Timing:   Instruction/counseling given: reminded to get eye exam, reminded to bring blood glucose meter & log to each visit and discussed diet Educational resources provided: brochure;handout Self management tools provided: copy of home glucose meter download Other plans:

## 2013-01-05 NOTE — Progress Notes (Signed)
Subjective:     Patient ID: [name], female   DOB: [birth year], 67 y.o.   MRN: [id number]  HPI The patient is a 67 YO woman who is here for a follow up of her mood. We did switch medications to paxil last visit because her mood was not controlled. She has history of well controlled DM II, hyperlipidemia, rash with itching and some CKD which was stage III when last evaluated. The rash was worsened and she has been scratching. She is still having significant stress with her neighbor who is on dialysis and needs lots of help. She is not sure that the paxil is helping more. She has never tried counseling and does not have any de-stressing activities that she practices. She denies chest pain, SOB, nausea, vomiting, fevers, chills. She has not had any hyperglycemic episodes since our last visit. She had one episode of low sugar (to 66) but had not eaten quickly enough after taking her insulin. She had a snack and was able to get her sugar up.   Review of Systems  Constitutional: Negative for fever, chills, diaphoresis, activity change, appetite change, fatigue and unexpected weight change.  HENT: Negative.   Eyes: Negative.   Respiratory: Negative for cough, chest tightness, shortness of breath and wheezing.   Cardiovascular: Negative for chest pain, palpitations and leg swelling.  Gastrointestinal: Negative for nausea, vomiting, abdominal pain, diarrhea and constipation.  Musculoskeletal: Positive for arthralgias. Negative for myalgias, back pain, joint swelling and gait problem.       Minimal shoulder pain with increased activity  Skin: Positive for rash. Negative for color change, pallor and wound.       Rash worsened  Neurological: Negative for dizziness, tremors, seizures, syncope, facial asymmetry, speech difficulty, weakness, light-headedness, numbness and headaches.  Psychiatric/Behavioral: Positive for sleep disturbance. Negative for suicidal ideas, hallucinations, behavioral problems,  confusion, self-injury, dysphoric mood, decreased concentration and agitation. The patient is not nervous/anxious and is not hyperactive.        Related to stress       Objective:   Physical Exam  Constitutional: She is oriented to person, place, and time. She appears well-developed and well-nourished.  Obese  HENT:  Head: Normocephalic and atraumatic.  Eyes: EOM are normal. Pupils are equal, round, and reactive to light.  Neck: Normal range of motion. Neck supple. No JVD present. No thyromegaly present.  Cardiovascular: Normal rate and regular rhythm.   Pulmonary/Chest: Effort normal and breath sounds normal. No respiratory distress. She has no wheezes. She has no rales. She exhibits no tenderness.  Abdominal: Soft. Bowel sounds are normal. She exhibits no distension. There is no rebound and no guarding.  Musculoskeletal: Normal range of motion. She exhibits no edema and no tenderness.  Neurological: She is alert and oriented to person, place, and time. No cranial nerve deficit.  Skin: Skin is warm and dry. Rash noted.  Some dark spots where she had arthroscopy on her knee (left), increasing rash with stigmata of scratching. No areas of skin breakdown but some scabs.       Assessment:   1. Please see problem oriented charting.  2. Disposition - The patient will be seen back in 3-4 months to check on her sugars. She will go back to dermatology. Paxil increased to 1.5 tabs (30 mg) for 1 week then 2 pills per day (40 mg) until she returns. Xanax was refilled 0.5 mg #60 no refills BID prn.  She did not get zoster shot yet.

## 2013-01-06 NOTE — Progress Notes (Signed)
Case discussed with Dr. Kollar soon after the resident saw the patient.  We reviewed the resident's history and exam and pertinent patient test results.  I agree with the assessment, diagnosis, and plan of care documented in the resident's note. 

## 2013-02-26 ENCOUNTER — Other Ambulatory Visit: Payer: Self-pay | Admitting: *Deleted

## 2013-02-26 DIAGNOSIS — G4700 Insomnia, unspecified: Secondary | ICD-10-CM

## 2013-02-26 MED ORDER — ZOLPIDEM TARTRATE 5 MG PO TABS: 5.0000 mg | ORAL_TABLET | Freq: Every evening | ORAL | Status: DC | PRN

## 2013-02-26 NOTE — Telephone Encounter (Signed)
Called to pharmacy 

## 2013-03-11 ENCOUNTER — Other Ambulatory Visit (INDEPENDENT_AMBULATORY_CARE_PROVIDER_SITE_OTHER): Payer: Medicare Other

## 2013-03-11 DIAGNOSIS — Z09 Encounter for follow-up examination after completed treatment for conditions other than malignant neoplasm: Secondary | ICD-10-CM

## 2013-03-11 DIAGNOSIS — L2089 Other atopic dermatitis: Secondary | ICD-10-CM

## 2013-03-11 LAB — COMPLETE METABOLIC PANEL WITH GFR
ALT: 26 U/L (ref 0–35)
AST: 20 U/L (ref 0–37)
Albumin: 4.1 g/dL (ref 3.5–5.2)
Alkaline Phosphatase: 88 U/L (ref 39–117)
Glucose, Bld: 131 mg/dL — ABNORMAL HIGH (ref 70–99)
Potassium: 4.5 mEq/L (ref 3.5–5.3)
Sodium: 139 mEq/L (ref 135–145)
Total Bilirubin: 0.4 mg/dL (ref 0.3–1.2)
Total Protein: 7.1 g/dL (ref 6.0–8.3)

## 2013-03-11 LAB — CBC WITH DIFFERENTIAL/PLATELET
Basophils Relative: 1 % (ref 0–1)
Eosinophils Absolute: 0.9 10*3/uL — ABNORMAL HIGH (ref 0.0–0.7)
Hemoglobin: 11.3 g/dL — ABNORMAL LOW (ref 12.0–15.0)
MCH: 27.5 pg (ref 26.0–34.0)
MCHC: 33.3 g/dL (ref 30.0–36.0)
Monocytes Relative: 6 % (ref 3–12)
Neutro Abs: 4.5 10*3/uL (ref 1.7–7.7)
Neutrophils Relative %: 45 % (ref 43–77)
Platelets: 306 10*3/uL (ref 150–400)
RBC: 4.11 MIL/uL (ref 3.87–5.11)

## 2013-03-12 LAB — HEPATITIS C ANTIBODY: HCV Ab: NEGATIVE

## 2013-03-12 LAB — HEPATITIS B CORE ANTIBODY, TOTAL: Hep B Core Total Ab: NONREACTIVE

## 2013-03-17 LAB — QUANTIFERON TB GOLD ASSAY (BLOOD)
Interferon Gamma Release Assay: NEGATIVE
Mitogen value: 7.16 IU/mL
Quantiferon Nil Value: 0.02 IU/mL
Quantiferon Tb Ag Minus Nil Value: 0 IU/mL
TB Ag value: 0.02 IU/mL

## 2013-03-17 NOTE — Progress Notes (Signed)
Faxed a copy of 03-11-2013 Final Lab report to [facility] PA-C at [phone]. CMP, CBC Diff, Hep B Surface Ag, Hep B Core Ab, Hep B Surface Ab, Hep C ab,  And TB Gold  03-17-13  1630p  Maryan Rued, PBT

## 2013-03-25 ENCOUNTER — Other Ambulatory Visit (INDEPENDENT_AMBULATORY_CARE_PROVIDER_SITE_OTHER): Payer: Medicare Other

## 2013-03-25 ENCOUNTER — Other Ambulatory Visit: Payer: Self-pay | Admitting: *Deleted

## 2013-03-25 DIAGNOSIS — L309 Dermatitis, unspecified: Secondary | ICD-10-CM

## 2013-03-25 DIAGNOSIS — F419 Anxiety disorder, unspecified: Secondary | ICD-10-CM

## 2013-03-25 DIAGNOSIS — K219 Gastro-esophageal reflux disease without esophagitis: Secondary | ICD-10-CM

## 2013-03-25 DIAGNOSIS — Z09 Encounter for follow-up examination after completed treatment for conditions other than malignant neoplasm: Secondary | ICD-10-CM

## 2013-03-25 DIAGNOSIS — I10 Essential (primary) hypertension: Secondary | ICD-10-CM

## 2013-03-25 LAB — CBC WITH DIFFERENTIAL/PLATELET
Basophils Relative: 1 % (ref 0–1)
Eosinophils Absolute: 0.7 10*3/uL (ref 0.0–0.7)
Eosinophils Relative: 9 % — ABNORMAL HIGH (ref 0–5)
Hemoglobin: 10.7 g/dL — ABNORMAL LOW (ref 12.0–15.0)
Lymphs Abs: 2.9 10*3/uL (ref 0.7–4.0)
MCH: 27.9 pg (ref 26.0–34.0)
MCHC: 33 g/dL (ref 30.0–36.0)
MCV: 84.4 fL (ref 78.0–100.0)
Monocytes Relative: 7 % (ref 3–12)
Neutrophils Relative %: 47 % (ref 43–77)
Platelets: 289 10*3/uL (ref 150–400)
RBC: 3.84 MIL/uL — ABNORMAL LOW (ref 3.87–5.11)
RDW: 15.5 % (ref 11.5–15.5)

## 2013-03-25 LAB — COMPLETE METABOLIC PANEL WITH GFR
ALT: 25 U/L (ref 0–35)
Alkaline Phosphatase: 106 U/L (ref 39–117)
BUN: 21 mg/dL (ref 6–23)
CO2: 26 mEq/L (ref 19–32)
Calcium: 10.1 mg/dL (ref 8.4–10.5)
Creat: 1.32 mg/dL — ABNORMAL HIGH (ref 0.50–1.10)
GFR, Est Non African American: 42 mL/min — ABNORMAL LOW
Glucose, Bld: 161 mg/dL — ABNORMAL HIGH (ref 70–99)
Sodium: 146 mEq/L — ABNORMAL HIGH (ref 135–145)
Total Bilirubin: 0.4 mg/dL (ref 0.3–1.2)

## 2013-03-26 ENCOUNTER — Other Ambulatory Visit: Payer: Self-pay | Admitting: Internal Medicine

## 2013-03-26 NOTE — Progress Notes (Signed)
Lab results faxed to [facility], PA-C on 03/26/2013 at 9:45am by [facility]

## 2013-03-27 ENCOUNTER — Other Ambulatory Visit: Payer: Self-pay | Admitting: *Deleted

## 2013-03-27 DIAGNOSIS — E119 Type 2 diabetes mellitus without complications: Secondary | ICD-10-CM

## 2013-03-28 MED ORDER — METFORMIN HCL 1000 MG PO TABS: 1000.0000 mg | ORAL_TABLET | Freq: Two times a day (BID) | ORAL | Status: DC

## 2013-03-28 MED ORDER — ALPRAZOLAM 0.5 MG PO TABS: 0.5000 mg | ORAL_TABLET | Freq: Two times a day (BID) | ORAL | Status: DC | PRN

## 2013-03-28 MED ORDER — LISINOPRIL 40 MG PO TABS: 40.0000 mg | ORAL_TABLET | Freq: Every day | ORAL | Status: DC

## 2013-03-28 MED ORDER — OMEPRAZOLE 40 MG PO CPDR: 40.0000 mg | DELAYED_RELEASE_CAPSULE | Freq: Every day | ORAL | Status: DC

## 2013-03-28 MED ORDER — HYDROCHLOROTHIAZIDE 25 MG PO TABS: 25.0000 mg | ORAL_TABLET | Freq: Every day | ORAL | Status: DC

## 2013-03-28 MED ORDER — TRAMADOL HCL 50 MG PO TABS: 100.0000 mg | ORAL_TABLET | Freq: Four times a day (QID) | ORAL | Status: DC | PRN

## 2013-03-28 NOTE — Telephone Encounter (Signed)
Called to pharm 

## 2013-04-04 ENCOUNTER — Encounter: Payer: Self-pay | Admitting: Internal Medicine

## 2013-04-04 ENCOUNTER — Ambulatory Visit (INDEPENDENT_AMBULATORY_CARE_PROVIDER_SITE_OTHER): Payer: Medicare Other | Admitting: Internal Medicine

## 2013-04-04 VITALS — BP 126/77 | HR 105 | Temp 96.7°F | Ht 63.5 in | Wt 198.7 lb

## 2013-04-04 DIAGNOSIS — Z720 Tobacco use: Secondary | ICD-10-CM

## 2013-04-04 DIAGNOSIS — I129 Hypertensive chronic kidney disease with stage 1 through stage 4 chronic kidney disease, or unspecified chronic kidney disease: Secondary | ICD-10-CM

## 2013-04-04 DIAGNOSIS — K2270 Barrett's esophagus without dysplasia: Secondary | ICD-10-CM

## 2013-04-04 DIAGNOSIS — N189 Chronic kidney disease, unspecified: Secondary | ICD-10-CM

## 2013-04-04 DIAGNOSIS — F17200 Nicotine dependence, unspecified, uncomplicated: Secondary | ICD-10-CM

## 2013-04-04 DIAGNOSIS — F329 Major depressive disorder, single episode, unspecified: Secondary | ICD-10-CM

## 2013-04-04 DIAGNOSIS — E1142 Type 2 diabetes mellitus with diabetic polyneuropathy: Secondary | ICD-10-CM

## 2013-04-04 DIAGNOSIS — L309 Dermatitis, unspecified: Secondary | ICD-10-CM

## 2013-04-04 DIAGNOSIS — L259 Unspecified contact dermatitis, unspecified cause: Secondary | ICD-10-CM

## 2013-04-04 DIAGNOSIS — E1149 Type 2 diabetes mellitus with other diabetic neurological complication: Secondary | ICD-10-CM

## 2013-04-04 DIAGNOSIS — I10 Essential (primary) hypertension: Secondary | ICD-10-CM

## 2013-04-04 LAB — POCT GLYCOSYLATED HEMOGLOBIN (HGB A1C): Hemoglobin A1C: 6.3

## 2013-04-04 LAB — GLUCOSE, CAPILLARY: Glucose-Capillary: 148 mg/dL — ABNORMAL HIGH (ref 70–99)

## 2013-04-04 NOTE — Assessment & Plan Note (Signed)
CMP, hepatitis panel, CBC normal on recent check by dermatology. She is doing better and think that some of the scratching was psychogenic and related to life stressors without other outlet.

## 2013-04-04 NOTE — Patient Instructions (Signed)
General Instructions:  We will not change your medicines. You should be able to get your ambien at the pharmacy. If not, have them call our clinic for refills or call our clinic yourself for refills at [phone].  Come back in 4-5 months. You are doing great with your blood pressure and your diabetes!  Exercise to Lose Weight Exercise and a healthy diet may help you lose weight. Your doctor may suggest specific exercises. EXERCISE IDEAS AND TIPS  Choose low-cost things you enjoy doing, such as walking, bicycling, or exercising to workout videos.  Take stairs instead of the elevator.  Walk during your lunch break.  Park your car further away from work or school.  Go to a gym or an exercise class.  Start with 5 to 10 minutes of exercise each day. Build up to 30 minutes of exercise 4 to 6 days a week.  Wear shoes with good support and comfortable clothes.  Stretch before and after working out.  Work out until you breathe harder and your heart beats faster.  Drink extra water when you exercise.  Do not do so much that you hurt yourself, feel dizzy, or get very short of breath. Exercises that burn about 150 calories:  Running 1  miles in 15 minutes.  Playing volleyball for 45 to 60 minutes.  Washing and waxing a car for 45 to 60 minutes.  Playing touch football for 45 minutes.  Walking 1  miles in 35 minutes.  Pushing a stroller 1  miles in 30 minutes.  Playing basketball for 30 minutes.  Raking leaves for 30 minutes.  Bicycling 5 miles in 30 minutes.  Walking 2 miles in 30 minutes.  Dancing for 30 minutes.  Shoveling snow for 15 minutes.  Swimming laps for 20 minutes.  Walking up stairs for 15 minutes.  Bicycling 4 miles in 15 minutes.  Gardening for 30 to 45 minutes.  Jumping rope for 15 minutes.  Washing windows or floors for 45 to 60 minutes. Document Released: 05/13/2010 Document Revised: 07/03/2011 Document Reviewed: 05/13/2010 [facility]  Patient Information 2014 [place], [place].   Treatment Goals:  Goals (1 Years of Data) as of 04/04/13         As of Today 01/03/13 12/06/12 11/21/12 10/18/12     Blood Pressure    . Blood Pressure < 140/90  126/77 145/87 136/88 120/70 122/82     Diet    . Increase water intake- especially when blood sugars are high   Yes        Result Component    . HEMOGLOBIN A1C < 7.0  6.3  6.7      . LDL CALC < 100            Progress Toward Treatment Goals:  Treatment Goal 04/04/2013  Hemoglobin A1C at goal  Blood pressure at goal  Stop smoking smoking less    Self Care Goals & Plans:  Self Care Goal 04/04/2013  Manage my medications take my medicines as prescribed; bring my medications to every visit; refill my medications on time; follow the sick day instructions if I am sick  Monitor my health keep track of my blood glucose; keep track of my blood pressure; check my feet daily  Eat healthy foods eat more vegetables; eat fruit for snacks and desserts  Be physically active find an activity I enjoy  Stop smoking -  Meeting treatment goals -    Home Blood Glucose Monitoring 04/04/2013  Check my blood sugar 2  times a day  When to check my blood sugar before breakfast; before dinner     Care Management & Community Referrals:  Referral 04/04/2013  Referrals made for care management support none needed  Referrals made to community resources -

## 2013-04-04 NOTE — Assessment & Plan Note (Signed)
BP Readings from Last 3 Encounters:  04/04/13 126/77  01/03/13 145/87  12/06/12 136/88    Lab Results  Component Value Date   NA 146* 03/25/2013   K 5.3 03/25/2013   CREATININE 1.32* 03/25/2013    Assessment: Blood pressure control: controlled Progress toward BP goal:  at goal Comments:   Plan: Medications:  continue current medications, amlodipine 5, HCTZ 25, lisinopril 40 mg Educational resources provided: brochure;handout;video Self management tools provided: instructions for home blood pressure monitoring Other plans:

## 2013-04-04 NOTE — Assessment & Plan Note (Signed)
Lab Results  Component Value Date   HGBA1C 6.3 04/04/2013   HGBA1C 6.7 12/06/2012   HGBA1C 6.6 09/06/2012     Assessment: Diabetes control: good control (HgbA1C at goal) Progress toward A1C goal:  at goal Comments:   Plan: Medications:  continue current medications, ON ACE-I, novolog 70/30 10 units BID, metformin 1000 mg BID Home glucose monitoring: Frequency: 2 times a day Timing: before breakfast;before dinner Instruction/counseling given: discussed foot care and discussed the need for weight loss Educational resources provided: brochure;handout Self management tools provided: instructions for home glucose monitoring Other plans:

## 2013-04-04 NOTE — Assessment & Plan Note (Signed)
Doing better at this time on Paxil 40 mg daily.

## 2013-04-04 NOTE — Assessment & Plan Note (Signed)
Stable on most recent CMP. Will continue to keep BP and sugars under good control.

## 2013-04-04 NOTE — Progress Notes (Signed)
Subjective:     Patient ID: [name], female   DOB: 23-Jan-1946, 67 y.o.   MRN: [id number]  HPI The patient is a 67 YO woman who is here for a follow up. We did increase paxil last visit because her mood was not controlled and it is doing better. Her neighbor who was on dialysis and a great stressor passed in October. She is still in the grieving process and another friend passed recently as well. She has history of well controlled DM II, hyperlipidemia, rash with itching and some CKD which was stage III when last evaluated. The rash is about the same and she has returned to dermatology and she has been scratching a little. She has never tried counseling and does not have any de-stressing activities that she practices. She denies chest pain, SOB, nausea, vomiting, fevers, chills. She has not had any hyper/hypoglycemic episodes since our last visit.   Review of Systems  Constitutional: Negative for fever, chills, diaphoresis, activity change, appetite change, fatigue and unexpected weight change.  HENT: Negative.   Eyes: Negative.   Respiratory: Negative for cough, chest tightness, shortness of breath and wheezing.   Cardiovascular: Negative for chest pain, palpitations and leg swelling.  Gastrointestinal: Negative for nausea, vomiting, abdominal pain, diarrhea and constipation.  Musculoskeletal: Positive for arthralgias. Negative for back pain, gait problem, joint swelling and myalgias.       Minimal shoulder pain with increased activity  Skin: Positive for rash. Negative for color change, pallor and wound.       Rash improving  Neurological: Negative for dizziness, tremors, seizures, syncope, facial asymmetry, speech difficulty, weakness, light-headedness, numbness and headaches.  Psychiatric/Behavioral: Positive for sleep disturbance. Negative for suicidal ideas, hallucinations, behavioral problems, confusion, self-injury, dysphoric mood, decreased concentration and agitation. The patient is  not nervous/anxious and is not hyperactive.        Related to stress       Objective:   Physical Exam  Constitutional: She is oriented to person, place, and time. She appears well-developed and well-nourished.  Obese  HENT:  Head: Normocephalic and atraumatic.  Eyes: EOM are normal. Pupils are equal, round, and reactive to light.  Neck: Normal range of motion. Neck supple. No JVD present. No thyromegaly present.  Cardiovascular: Normal rate and regular rhythm.   Pulmonary/Chest: Effort normal and breath sounds normal. No respiratory distress. She has no wheezes. She has no rales. She exhibits no tenderness.  Abdominal: Soft. Bowel sounds are normal. She exhibits no distension. There is no rebound and no guarding.  Musculoskeletal: Normal range of motion. She exhibits no edema and no tenderness.  Neurological: She is alert and oriented to person, place, and time. No cranial nerve deficit.  Skin: Skin is warm and dry. Rash noted.  Some dark spots where she had arthroscopy on her knee (left), stable to improved rash. No areas of skin breakdown and some healed areas that previously were scabbed over.        Assessment:   1. Please see problem oriented charting.  2. Disposition - The patient will be seen back in 4-5 months for a follow up. She will continue with dermatology. No change to regimen as BP and HgA1c great today! Talked about exercising and she will work to increase in the hopes of losing weight after the holidays.

## 2013-04-04 NOTE — Assessment & Plan Note (Signed)
  Assessment: Progress toward smoking cessation:  smoking less Barriers to progress toward smoking cessation:  stress Comments: now that some stressors are removed advised her to try quitting, she will think about it  Plan: Instruction/counseling given:  I counseled patient on the dangers of tobacco use, advised patient to stop smoking, and reviewed strategies to maximize success. Educational resources provided:  [employer] account manager) brochure Self management tools provided:    Medications to assist with smoking cessation:  None Patient agreed to the following self-care plans for smoking cessation:    Other plans:

## 2013-04-04 NOTE — Assessment & Plan Note (Signed)
She will return to Dr. Sharlett Iles for repeat evaluation.

## 2013-04-04 NOTE — Progress Notes (Signed)
Case discussed with Dr. Kollar soon after the resident saw the patient.  We reviewed the resident's history and exam and pertinent patient test results.  I agree with the assessment, diagnosis, and plan of care documented in the resident's note. 

## 2013-04-08 ENCOUNTER — Other Ambulatory Visit: Payer: Medicare Other

## 2013-04-08 ENCOUNTER — Other Ambulatory Visit (INDEPENDENT_AMBULATORY_CARE_PROVIDER_SITE_OTHER): Payer: Medicare Other

## 2013-04-08 DIAGNOSIS — Z09 Encounter for follow-up examination after completed treatment for conditions other than malignant neoplasm: Secondary | ICD-10-CM

## 2013-04-08 DIAGNOSIS — I10 Essential (primary) hypertension: Secondary | ICD-10-CM

## 2013-04-08 LAB — COMPLETE METABOLIC PANEL WITH GFR
ALT: 20 U/L (ref 0–35)
AST: 20 U/L (ref 0–37)
Albumin: 3.9 g/dL (ref 3.5–5.2)
BUN: 21 mg/dL (ref 6–23)
CO2: 27 mEq/L (ref 19–32)
Calcium: 9.4 mg/dL (ref 8.4–10.5)
Chloride: 109 mEq/L (ref 96–112)
Creat: 1.36 mg/dL — ABNORMAL HIGH (ref 0.50–1.10)
GFR, Est Non African American: 40 mL/min — ABNORMAL LOW
Potassium: 4.8 mEq/L (ref 3.5–5.3)
Sodium: 141 mEq/L (ref 135–145)
Total Protein: 7 g/dL (ref 6.0–8.3)

## 2013-04-08 LAB — CBC WITH DIFFERENTIAL/PLATELET
Basophils Absolute: 0.1 10*3/uL (ref 0.0–0.1)
Eosinophils Relative: 7 % — ABNORMAL HIGH (ref 0–5)
Lymphocytes Relative: 33 % (ref 12–46)
Lymphs Abs: 2.7 10*3/uL (ref 0.7–4.0)
MCV: 83.2 fL (ref 78.0–100.0)
Neutro Abs: 4.3 10*3/uL (ref 1.7–7.7)
Neutrophils Relative %: 52 % (ref 43–77)
Platelets: 314 10*3/uL (ref 150–400)
RBC: 3.69 MIL/uL — ABNORMAL LOW (ref 3.87–5.11)
RDW: 15.8 % — ABNORMAL HIGH (ref 11.5–15.5)
WBC: 8.2 10*3/uL (ref 4.0–10.5)

## 2013-04-09 NOTE — Progress Notes (Signed)
Lab results faxed to [facility], PA-C at [phone] on 04-09-13 at 0:845am by [facility]

## 2013-04-30 ENCOUNTER — Other Ambulatory Visit: Payer: Self-pay | Admitting: *Deleted

## 2013-04-30 DIAGNOSIS — G4700 Insomnia, unspecified: Secondary | ICD-10-CM

## 2013-04-30 MED ORDER — INSULIN ASPART PROT & ASPART (70-30 MIX) 100 UNIT/ML PEN: 10.0000 [IU] | PEN_INJECTOR | Freq: Two times a day (BID) | SUBCUTANEOUS | Status: DC

## 2013-04-30 MED ORDER — ZOLPIDEM TARTRATE 5 MG PO TABS: 5.0000 mg | ORAL_TABLET | Freq: Every evening | ORAL | Status: DC | PRN

## 2013-04-30 MED ORDER — "PEN NEEDLES 3/16"" 31G X 5 MM MISC": Status: DC

## 2013-05-01 NOTE — Telephone Encounter (Signed)
Called to pharm 

## 2013-07-04 ENCOUNTER — Encounter: Payer: Self-pay | Admitting: Internal Medicine

## 2013-07-04 ENCOUNTER — Ambulatory Visit (INDEPENDENT_AMBULATORY_CARE_PROVIDER_SITE_OTHER): Payer: Medicare Other | Admitting: Internal Medicine

## 2013-07-04 VITALS — BP 138/74 | HR 108 | Temp 97.0°F | Ht 63.75 in | Wt 197.2 lb

## 2013-07-04 DIAGNOSIS — G4700 Insomnia, unspecified: Secondary | ICD-10-CM

## 2013-07-04 DIAGNOSIS — B029 Zoster without complications: Secondary | ICD-10-CM

## 2013-07-04 DIAGNOSIS — K2270 Barrett's esophagus without dysplasia: Secondary | ICD-10-CM

## 2013-07-04 DIAGNOSIS — F329 Major depressive disorder, single episode, unspecified: Secondary | ICD-10-CM

## 2013-07-04 DIAGNOSIS — F3289 Other specified depressive episodes: Secondary | ICD-10-CM

## 2013-07-04 DIAGNOSIS — E785 Hyperlipidemia, unspecified: Secondary | ICD-10-CM

## 2013-07-04 DIAGNOSIS — L309 Dermatitis, unspecified: Secondary | ICD-10-CM

## 2013-07-04 DIAGNOSIS — I10 Essential (primary) hypertension: Secondary | ICD-10-CM

## 2013-07-04 DIAGNOSIS — E1149 Type 2 diabetes mellitus with other diabetic neurological complication: Secondary | ICD-10-CM

## 2013-07-04 DIAGNOSIS — L259 Unspecified contact dermatitis, unspecified cause: Secondary | ICD-10-CM

## 2013-07-04 LAB — GLUCOSE, CAPILLARY: Glucose-Capillary: 148 mg/dL — ABNORMAL HIGH (ref 70–99)

## 2013-07-04 LAB — POCT GLYCOSYLATED HEMOGLOBIN (HGB A1C): HEMOGLOBIN A1C: 6.7

## 2013-07-04 MED ORDER — MELATONIN 1 MG PO TABS: 1.0000 mg | ORAL_TABLET | Freq: Every day | ORAL | Status: DC

## 2013-07-04 MED ORDER — ZOSTER VACCINE LIVE 19400 UNT/0.65ML ~~LOC~~ SOLR: 0.6500 mL | Freq: Once | SUBCUTANEOUS | Status: DC

## 2013-07-04 NOTE — Patient Instructions (Addendum)
Work on exercising more and cutting back on how much food and calories you are eating.  You need to exercise for 30 minutes a day at least 5 days per week.  We have sent in melatonin to help you sleep through the night. Take 1 pill 30 minutes before you go to sleep.   We have given you the prescription for the shingles shot.   Come back in about 6 months to check in on your blood pressures and sugars.   Call us if you have questions or problems before then at [phone].  Please bring your medicines with you each time you come.   Medicines may be:  Eye drops  Herbal   Vitamins  Pills  Seeing these help [place] take care of you.  Exercise to Lose Weight Exercise and a healthy diet may help you lose weight. Your doctor may suggest specific exercises. EXERCISE IDEAS AND TIPS  Choose low-cost things you enjoy doing, such as walking, bicycling, or exercising to workout videos.  Take stairs instead of the elevator.  Walk during your lunch break.  Park your car further away from work or school.  Go to a gym or an exercise class.  Start with 5 to 10 minutes of exercise each day. Build up to 30 minutes of exercise 4 to 6 days a week.  Wear shoes with good support and comfortable clothes.  Stretch before and after working out.  Work out until you breathe harder and your heart beats faster.  Drink extra water when you exercise.  Do not do so much that you hurt yourself, feel dizzy, or get very short of breath. Exercises that burn about 150 calories:  Running 1  miles in 15 minutes.  Playing volleyball for 45 to 60 minutes.  Washing and waxing a car for 45 to 60 minutes.  Playing touch football for 45 minutes.  Walking 1  miles in 35 minutes.  Pushing a stroller 1  miles in 30 minutes.  Playing basketball for 30 minutes.  Raking leaves for 30 minutes.  Bicycling 5 miles in 30 minutes.  Walking 2 miles in 30 minutes.  Dancing for 30 minutes.  Shoveling  snow for 15 minutes.  Swimming laps for 20 minutes.  Walking up stairs for 15 minutes.  Bicycling 4 miles in 15 minutes.  Gardening for 30 to 45 minutes.  Jumping rope for 15 minutes.  Washing windows or floors for 45 to 60 minutes. Document Released: 05/13/2010 Document Revised: 07/03/2011 Document Reviewed: 05/13/2010 [facility] Patient Information 2014 [place], [place].  Calorie Counting Diet A calorie counting diet requires you to eat the number of calories that are right for you in a day. Calories are the measurement of how much energy you get from the food you eat. Eating the right amount of calories is important for staying at a healthy weight. If you eat too many calories, your body will store them as fat and you may gain weight. If you eat too few calories, you may lose weight. Counting the number of calories you eat during a day will help you know if you are eating the right amount. A Registered Dietitian can determine how many calories you need in a day. The amount of calories needed varies from person to person. If your goal is to lose weight, you will need to eat fewer calories. Losing weight can benefit you if you are overweight or have health problems such as heart disease, high blood pressure, or diabetes. If your  goal is to gain weight, you will need to eat more calories. Gaining weight may be necessary if you have a certain health problem that causes your body to need more energy. TIPS Whether you are increasing or decreasing the number of calories you eat during a day, it may be hard to get used to changes in what you eat and drink. The following are tips to help you keep track of the number of calories you eat.  Measure foods at home with measuring cups. This helps you know the amount of food and number of calories you are eating.  Restaurants often serve food in amounts that are larger than 1 serving. While eating out, estimate how many servings of a food you are given. For  example, a serving of cooked rice is  cup or about the size of half of a fist. Knowing serving sizes will help you be aware of how much food you are eating at restaurants.  Ask for smaller portion sizes or child-size portions at restaurants.  Plan to eat half of a meal at a restaurant. Take the rest home or share the other half with a friend.  Read the Nutrition Facts panel on food labels for calorie content and serving size. You can find out how many servings are in a package, the size of a serving, and the number of calories each serving has.  For example, a package might contain 3 cookies. The Nutrition Facts panel on that package says that 1 serving is 1 cookie. Below that, it will say there are 3 servings in the container. The calories section of the Nutrition Facts label says there are 90 calories. This means there are 90 calories in 1 cookie (1 serving). If you eat 1 cookie you have eaten 90 calories. If you eat all 3 cookies, you have eaten 270 calories (3 servings x 90 calories = 270 calories). The list below tells you how big or small some common portion sizes are.  1 oz.........4 stacked dice.  3 oz........Marland KitchenDeck of cards.  1 tsp.......Marland KitchenTip of little finger.  1 tbs......Marland KitchenMarland KitchenThumb.  2 tbs.......Marland KitchenGolf ball.   cup......Marland KitchenHalf of a fist.  1 cup.......Marland KitchenA fist. KEEP A FOOD LOG Write down every food item you eat, the amount you eat, and the number of calories in each food you eat during the day. At the end of the day, you can add up the total number of calories you have eaten. It may help to keep a list like the one below. Find out the calorie information by reading the Nutrition Facts panel on food labels. Breakfast  Bran cereal (1 cup, 110 calories).  Fat-free milk ( cup, 45 calories). Snack  Apple (1 medium, 80 calories). Lunch  Spinach (1 cup, 20 calories).  Tomato ( medium, 20 calories).  Chicken breast strips (3 oz, 165 calories).  Shredded cheddar cheese ( cup,  110 calories).  Light [place] dressing (2 tbs, 60 calories).  Whole-wheat bread (1 slice, 80 calories).  Tub margarine (1 tsp, 35 calories).  Vegetable soup (1 cup, 160 calories). Dinner  Pork chop (3 oz, 190 calories).  Brown rice (1 cup, 215 calories).  Steamed broccoli ( cup, 20 calories).  Strawberries (1  cup, 65 calories).  Whipped cream (1 tbs, 50 calories). Daily Calorie Total: [id number] Document Released: 04/10/2005 Document Revised: 07/03/2011 Document Reviewed: 10/05/2006 [facility] Patient Information 2014 [place].

## 2013-07-06 ENCOUNTER — Other Ambulatory Visit: Payer: Self-pay | Admitting: Internal Medicine

## 2013-07-06 DIAGNOSIS — F329 Major depressive disorder, single episode, unspecified: Secondary | ICD-10-CM

## 2013-07-06 DIAGNOSIS — F3289 Other specified depressive episodes: Secondary | ICD-10-CM

## 2013-07-07 MED ORDER — PAROXETINE HCL 40 MG PO TABS: 40.0000 mg | ORAL_TABLET | Freq: Every day | ORAL | Status: DC

## 2013-07-07 NOTE — Assessment & Plan Note (Signed)
She will go back to Dr. Sharlett Iles of GI.

## 2013-07-07 NOTE — Progress Notes (Signed)
Subjective:     Patient ID: [name], female   DOB: 1946-01-16, 68 y.o.   MRN: [id number]  HPI The patient is a 68 YO woman who is here for a follow up of her rash. Her mood is better controlled and she is still processing her grief over the recent loss of several people in her life. She is still scratching some but is going to some light therapy with the dermatologist and she thinks that is She has history of well controlled DM II, hyperlipidemia, rash with itching and some CKD which was stage III when last evaluated. She denies chest pain, SOB, nausea, vomiting, fevers, chills. She has not had any hyper/hypoglycemic episodes since our last visit. She would like to lose some weight and knows that she has not been exercising.  Review of Systems  Constitutional: Negative for fever, chills, diaphoresis, activity change, appetite change, fatigue and unexpected weight change.  HENT: Negative.   Eyes: Negative.   Respiratory: Negative for cough, chest tightness, shortness of breath and wheezing.   Cardiovascular: Negative for chest pain, palpitations and leg swelling.  Gastrointestinal: Negative for nausea, vomiting, abdominal pain, diarrhea and constipation.  Musculoskeletal: Positive for arthralgias. Negative for back pain, gait problem, joint swelling and myalgias.       Minimal shoulder pain with increased activity  Skin: Positive for rash. Negative for color change, pallor and wound.       Rash improving  Neurological: Negative for dizziness, tremors, seizures, syncope, facial asymmetry, speech difficulty, weakness, light-headedness, numbness and headaches.  Psychiatric/Behavioral: Positive for sleep disturbance. Negative for suicidal ideas, hallucinations, behavioral problems, confusion, self-injury, dysphoric mood, decreased concentration and agitation. The patient is not nervous/anxious and is not hyperactive.        Related to stress       Objective:   Physical Exam   Constitutional: She is oriented to person, place, and time. She appears well-developed and well-nourished.  Obese  HENT:  Head: Normocephalic and atraumatic.  Eyes: EOM are normal. Pupils are equal, round, and reactive to light.  Neck: Normal range of motion. Neck supple. No JVD present. No thyromegaly present.  Cardiovascular: Normal rate and regular rhythm.   Pulmonary/Chest: Effort normal and breath sounds normal. No respiratory distress. She has no wheezes. She has no rales. She exhibits no tenderness.  Abdominal: Soft. Bowel sounds are normal. She exhibits no distension. There is no rebound and no guarding.  Musculoskeletal: Normal range of motion. She exhibits no edema and no tenderness.  Neurological: She is alert and oriented to person, place, and time. No cranial nerve deficit.  Skin: Skin is warm and dry. Rash noted.  Some dark spots where she had arthroscopy on her knee (left), stable to improved rash. No areas of skin breakdown and some healed areas that previously were scabbed over.        Assessment:   1. Please see problem oriented charting.  2. Disposition - The patient will be seen back in about 6 months for a follow up. She will continue with dermatology. No change to regimen as BP and HgA1c great today! Talked about exercising and she will work to increase in the hopes of losing weight. Given prescription today for the shingles shot. Added melatonin to help with her sleep.

## 2013-07-07 NOTE — Assessment & Plan Note (Signed)
Stable to mildly improved and advised to continue following with dermatology and advised to cut her nails.

## 2013-07-07 NOTE — Assessment & Plan Note (Signed)
Suspect that her itching and mood affects her sleep and advised her to exercise more, given prescription for melatonin to help with her staying asleep better. She does not wish counseling and feels her mood is stable.

## 2013-07-07 NOTE — Assessment & Plan Note (Signed)
BP is well controlled of amlodipine 5 mg, HCTZ 25 mg daily, lisinopril 40 mg daily.

## 2013-07-07 NOTE — Assessment & Plan Note (Signed)
Suspect this plays into her rash and sleep habits however she does seem to be doing better with her depression. She still does not want counseling.

## 2013-07-07 NOTE — Assessment & Plan Note (Signed)
Suspect that her recent weight gain has caused the mild increase in her HgA1c to 6.7 today. No change to her regimen today as she is still well controlled. Advised to increase exercise and work on her diet.

## 2013-07-07 NOTE — Telephone Encounter (Signed)
Dose had been changed so sent in rx for new dosing.

## 2013-07-07 NOTE — Assessment & Plan Note (Signed)
She was given prescription of shingles vaccine today as she is concerned about recurrence.

## 2013-07-10 NOTE — Progress Notes (Signed)
Case discussed with Dr. Kollar at the time of the visit.  We reviewed the resident's history and exam and pertinent patient test results.  I agree with the assessment, diagnosis, and plan of care documented in the resident's note.     

## 2013-07-22 ENCOUNTER — Ambulatory Visit: Payer: Medicare Other | Admitting: Internal Medicine

## 2013-07-30 ENCOUNTER — Other Ambulatory Visit: Payer: Self-pay | Admitting: Internal Medicine

## 2013-07-31 ENCOUNTER — Encounter: Payer: Self-pay | Admitting: Internal Medicine

## 2013-07-31 ENCOUNTER — Ambulatory Visit (INDEPENDENT_AMBULATORY_CARE_PROVIDER_SITE_OTHER): Payer: Medicare Other | Admitting: Internal Medicine

## 2013-07-31 VITALS — BP 117/76 | HR 103 | Temp 97.3°F | Wt 192.6 lb

## 2013-07-31 DIAGNOSIS — D171 Benign lipomatous neoplasm of skin and subcutaneous tissue of trunk: Secondary | ICD-10-CM | POA: Insufficient documentation

## 2013-07-31 DIAGNOSIS — L309 Dermatitis, unspecified: Secondary | ICD-10-CM

## 2013-07-31 DIAGNOSIS — L259 Unspecified contact dermatitis, unspecified cause: Secondary | ICD-10-CM

## 2013-07-31 DIAGNOSIS — D1779 Benign lipomatous neoplasm of other sites: Secondary | ICD-10-CM

## 2013-07-31 NOTE — Patient Instructions (Signed)
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 Please monitor for signs of changing size or pain. Please follow up with PCP in 1 - 2 months.  Lipoma A lipoma is a noncancerous (benign) tumor composed of fat cells. They are usually found under the skin (subcutaneous). A lipoma may occur in any tissue of the body that contains fat. Common areas for lipomas to appear include the back, shoulders, buttocks, and thighs. Lipomas are a very common soft tissue growth. They are soft and grow slowly. Most problems caused by a lipoma depend on where it is growing. DIAGNOSIS  A lipoma can be diagnosed with a physical exam. These tumors rarely become cancerous, but radiographic studies can help determine this for certain. Studies used may include:  Computerized X-ray scans (CT or CAT scan).  Computerized magnetic scans (MRI). TREATMENT  Small lipomas that are not causing problems may be watched. If a lipoma continues to enlarge or causes problems, removal is often the best treatment. Lipomas can also be removed to improve appearance. Surgery is done to remove the fatty cells and the surrounding capsule. Most often, this is done with medicine that numbs the area (local anesthetic). The removed tissue is examined under a microscope to make sure it is not cancerous. Keep all follow-up appointments with your caregiver. SEEK MEDICAL CARE IF:   The lipoma becomes larger or hard.  The lipoma becomes painful, red, or increasingly swollen. These could be signs of infection or a more serious condition. Document Released: 03/31/2002 Document Revised: 07/03/2011 Document Reviewed: 09/10/2009 [facility] Patient Information 2014 [place], [place].

## 2013-07-31 NOTE — Progress Notes (Signed)
Patient ID: [name], female   DOB: 1945-11-23, 68 y.o.   MRN: [id number]    Subjective:   Patient ID: [name] female   DOB: [birth year] 68 y.o.   MRN: [id number]  HPI: [name] is a 68 y.o. woman who comes to clinic today with a cc of mass on her left shoulder blade. The mass has been present for a long time and has not changed in size. There is no pain. It does not cause her trouble. She denies changes in weight. She denies nausea. Denies changes in bowel or bladder habits.   She admits to a rash that she is being followed by dermatology. She receives three days a week phototherapy for this problem.     Past Medical History  Diagnosis Date  . Diabetes mellitus   . Asthma     childhood  . Depression   . Anxiety   . Hypertension   . Hyperlipidemia   . 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   Marland Kitchen Hx MRSA infection 2009  . Dysphagia   . Personal history of colonic polyps 10/17/2010    hyperplastic  . Barrett's esophagus   . Hiatal hernia    Current Outpatient Prescriptions  Medication Sig Dispense Refill  . ACCU-CHEK FASTCLIX LANCETS MISC 1 Device by Does not apply route 4 (four) times daily -  before meals and at bedtime. Dx: 250.00  100 each  11  . ACCU-CHEK SMARTVIEW test strip USE AS DIRECTED  100 each  12  . ALPRAZolam (XANAX) 0.5 MG tablet Take 1 tablet (0.5 mg total) by mouth 2 (two) times daily as needed for anxiety.  60 tablet  2  . amLODipine (NORVASC) 5 MG tablet Take 1 tablet (5 mg total) by mouth daily.  30 tablet  11  . aspirin EC 81 MG tablet Take 1 tablet (81 mg total) by mouth daily.  30 tablet  5  . hydrochlorothiazide (HYDRODIURIL) 25 MG tablet Take 1 tablet (25 mg total) by mouth daily.  30 tablet  6  . insulin aspart protamine- aspart (NOVOLOG MIX 70/30) (70-30) 100 UNIT/ML injection Inject 0.1 mLs (10 Units total) into the skin 2 (two) times daily with a meal.  15 mL   12  . Insulin Pen Needle (PEN NEEDLES 3/16") 31G X 5 MM MISC Use twice daily to inject insulin. diag code 250.60. Insulin dependent  100 each  5  . Insulin Syringe-Needle U-100 (INSULIN SYRINGE .5CC/31GX5/16") 31G X 5/16" 0.5 ML MISC 1 each by Does not apply route 2 (two) times daily before a meal.  100 each  3  . latanoprost (XALATAN) 0.005 % ophthalmic solution       . lisinopril (PRINIVIL,ZESTRIL) 40 MG tablet Take 1 tablet (40 mg total) by mouth daily.  30 tablet  6  . loratadine (CLARITIN) 10 MG tablet Take 1 tablet (10 mg total) by mouth daily.  30 tablet  0  . Melatonin 1 MG TABS Take 1 tablet (1 mg total) by mouth at bedtime.  30 tablet  3  . metFORMIN (GLUCOPHAGE) 1000 MG tablet Take 1 tablet (1,000 mg total) by mouth 2 (two) times daily with a meal. Break pills in half for easier swallowing.  60 tablet  6  . omeprazole (PRILOSEC) 40 MG capsule Take 1 capsule (40 mg total) by mouth daily.  30 capsule  2  . PARoxetine (PAXIL) 40 MG  tablet Take 1 tablet (40 mg total) by mouth daily.  30 tablet  6  . pravastatin (PRAVACHOL) 40 MG tablet TAKE ONE TABLET BY MOUTH ONCE DAILY  30 tablet  6  . traMADol (ULTRAM) 50 MG tablet Take 2-3 tablets (100-150 mg total) by mouth every 6 (six) hours as needed. Up to 4 times a day for pain  180 tablet  0  . zolpidem (AMBIEN) 5 MG tablet Take 1 tablet (5 mg total) by mouth at bedtime as needed for sleep.  30 tablet  5  . zoster vaccine live, PF, (ZOSTAVAX) 60454 UNT/0.65ML injection Inject 19,400 Units into the skin once.  1 each  0   No current facility-administered medications for this visit.   Family History  Problem Relation Age of Onset  . Colon cancer Neg Hx    History   Social History  . Marital Status: Divorced    Spouse Name: N/A    Number of Children: 0  . Years of Education: N/A   Occupational History  . Retired    Social History Main Topics  . Smoking status: Current Every Day Smoker -- 0.25 packs/day for 40 years    Types:  Cigarettes  . Smokeless tobacco: Never Used  . Alcohol Use: 1.0 oz/week    2 drink(s) per week     Comment: wine  . Drug Use: No  . Sexual Activity: None   Other Topics Concern  . None   Social History Narrative   Financial assistance approved for 100% discount at [facility] and has [facility] card per [employer]   02/10/2010      3 caffeine drinks daily    Review of Systems: Pertinent items are noted in HPI. Objective:  Physical Exam: Filed Vitals:   07/31/13 1316  BP: 117/76  Pulse: 103  Temp: 97.3 F (36.3 C)  TempSrc: Oral  Weight: 192 lb 9.6 oz (87.363 kg)  SpO2: 99%   Physical Exam  Constitutional: She is oriented to person, place, and time. She appears well-developed and well-nourished. No distress.  Cardiovascular: Normal rate, regular rhythm, normal heart sounds and intact distal pulses.  Exam reveals no friction rub.   No murmur heard. Pulmonary/Chest: Effort normal and breath sounds normal. No respiratory distress. She has no wheezes. She has no rales.  Musculoskeletal: She exhibits no tenderness.       Back:  10 cm x 6 cm subcutaneous mass above the left scapula. No tenderness. No skin changes. No warmth.  Neurological: She is alert and oriented to person, place, and time.  Skin: She is not diaphoretic.  Differ to dermatologist, who patient will see Friday.  Psychiatric: She has a normal mood and affect. Her behavior is normal.    Assessment & Plan:

## 2013-07-31 NOTE — Assessment & Plan Note (Addendum)
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 No recent change in size. No pain. No skin changes. Plan to follow up in 1 -2 months with PCP to assess for changes. I instructed the patient to call if there are new or worsening symptoms. Patient agrees with this plan.

## 2013-07-31 NOTE — Assessment & Plan Note (Signed)
Patients is following with dermatology. She will discuss it with Derm on Friday.

## 2013-08-01 NOTE — Progress Notes (Signed)
Case discussed with Dr. Komanski at the time of the visit.  We reviewed the resident's history and exam and pertinent patient test results.  I agree with the assessment, diagnosis, and plan of care documented in the resident's note.      

## 2013-08-05 ENCOUNTER — Encounter: Payer: Self-pay | Admitting: Internal Medicine

## 2013-08-05 ENCOUNTER — Ambulatory Visit (HOSPITAL_COMMUNITY)
Admission: RE | Admit: 2013-08-05 | Discharge: 2013-08-05 | Disposition: A | Discharge status: Home / Self Care | Payer: Medicare Other | Source: Ambulatory Visit | Attending: Internal Medicine | Admitting: Internal Medicine

## 2013-08-05 ENCOUNTER — Ambulatory Visit (INDEPENDENT_AMBULATORY_CARE_PROVIDER_SITE_OTHER): Payer: Medicare Other | Admitting: Internal Medicine

## 2013-08-05 DIAGNOSIS — M79675 Pain in left toe(s): Secondary | ICD-10-CM | POA: Insufficient documentation

## 2013-08-05 DIAGNOSIS — I10 Essential (primary) hypertension: Secondary | ICD-10-CM

## 2013-08-05 DIAGNOSIS — M79609 Pain in unspecified limb: Secondary | ICD-10-CM | POA: Insufficient documentation

## 2013-08-05 DIAGNOSIS — X58XXXA Exposure to other specified factors, initial encounter: Secondary | ICD-10-CM | POA: Insufficient documentation

## 2013-08-05 DIAGNOSIS — L309 Dermatitis, unspecified: Secondary | ICD-10-CM

## 2013-08-05 DIAGNOSIS — L259 Unspecified contact dermatitis, unspecified cause: Secondary | ICD-10-CM

## 2013-08-05 DIAGNOSIS — S92919A Unspecified fracture of unspecified toe(s), initial encounter for closed fracture: Secondary | ICD-10-CM | POA: Insufficient documentation

## 2013-08-05 LAB — CBC WITH DIFFERENTIAL/PLATELET
Basophils Absolute: 0.1 10*3/uL (ref 0.0–0.1)
Basophils Relative: 1 % (ref 0–1)
Eosinophils Absolute: 0.4 10*3/uL (ref 0.0–0.7)
Eosinophils Relative: 5 % (ref 0–5)
HCT: 27 % — ABNORMAL LOW (ref 36.0–46.0)
Hemoglobin: 8.7 g/dL — ABNORMAL LOW (ref 12.0–15.0)
LYMPHS ABS: 3.1 10*3/uL (ref 0.7–4.0)
LYMPHS PCT: 36 % (ref 12–46)
MCH: 26.1 pg (ref 26.0–34.0)
MCHC: 32.2 g/dL (ref 30.0–36.0)
MCV: 81.1 fL (ref 78.0–100.0)
MONOS PCT: 5 % (ref 3–12)
Monocytes Absolute: 0.4 10*3/uL (ref 0.1–1.0)
NEUTROS PCT: 53 % (ref 43–77)
Neutro Abs: 4.6 10*3/uL (ref 1.7–7.7)
Platelets: 303 10*3/uL (ref 150–400)
RBC: 3.33 MIL/uL — AB (ref 3.87–5.11)
RDW: 17.6 % — ABNORMAL HIGH (ref 11.5–15.5)
WBC: 8.6 10*3/uL (ref 4.0–10.5)

## 2013-08-05 LAB — GLUCOSE, CAPILLARY: Glucose-Capillary: 101 mg/dL — ABNORMAL HIGH (ref 70–99)

## 2013-08-05 MED ORDER — TRAMADOL HCL 50 MG PO TABS: 100.0000 mg | ORAL_TABLET | Freq: Four times a day (QID) | ORAL | Status: DC | PRN

## 2013-08-05 NOTE — Progress Notes (Signed)
   Subjective:    Patient ID: [name], female    DOB: 1946-04-21, 68 y.o.   MRN: [id number]  HPI Comments: 68 y.o Past Medical History Diabetes mellitus (07/04/13 HA1C 6.7), Asthma ,Depression/Anxiety, Hypertension (145/84 HR 114), Hyperlipidemia, History of herpes zoster, Hx MRSA infection, Dysphagia, Personal history of colonic polyps (hyperplastic), Barrett's esophagus, hiatal hernia    She presents for:  1) 5th left toe red, painful 6/10 today (initially 11/10), unsure if had a mechanism of injury.  Pain has been present since Sunday. She did fall backwards on a sitting stool Sunday.  She did not try any meds OTC for pain.  Walking makes the pain worse and when she hits it.  Rest makes the pain better.   2) DM-no meter brought today. She reports compliance with medications though she is sticking herself in b/l arms instead of abdomen b/c of a rash she follows with dermatology MWF on her abdomen and they requested she not stick herself in the abdomen.   3) She has a red rash to her right forearm ? 2 days. She noticed rash after walking her father's dog and his dog playfully bites her.  She plans to show the dermatologist on 08/06/13 and let then evaluate                SH: smoking 1 ppd, lives alone, no children                                Review of Systems  Constitutional: Negative for fever and chills.  Respiratory: Negative for shortness of breath.   Cardiovascular: Negative for chest pain.  Skin: Positive for rash.       Objective:   Physical Exam  Nursing note and vitals reviewed. Constitutional: She is oriented to person, place, and time. She appears well-developed and well-nourished. She is cooperative. No distress.  HENT:  Head: Normocephalic and atraumatic.  Mouth/Throat: Oropharynx is clear and moist and mucous membranes are normal. No oropharyngeal exudate.  Eyes: Conjunctivae are normal. Pupils are equal, round, and reactive to light. Right eye exhibits no  discharge. Left eye exhibits no discharge. No scleral icterus.  Cardiovascular: Regular rhythm, S1 normal, S2 normal and normal heart sounds.  Tachycardia present.   No murmur heard. No lower ext edema   Pulmonary/Chest: Effort normal and breath sounds normal. No respiratory distress. She has no wheezes.  Abdominal:  obese  Musculoskeletal:  Left 5 th toe with redness, mild swelling ttp dorsally and plantar surface (mild)  Neurological: She is alert and oriented to person, place, and time. Gait normal.  Skin: Skin is warm and dry. Rash noted. She is not diaphoretic.  Annular red rash right forearm  Psychiatric: She has a normal mood and affect. Her speech is normal and behavior is normal. Judgment and thought content normal. Cognition and memory are normal.          Assessment & Plan:  F/u in 09/2013 or 10/2013 with PCP

## 2013-08-05 NOTE — Assessment & Plan Note (Addendum)
Left 5th toe pain, mild swelling will r/o fracture/injury after fall with Xray left toe.  Less likely infectious  Will check CBC Tramadol for pain control 100 mg q6 hours prn #60 no refills   RTC if not improving

## 2013-08-05 NOTE — Assessment & Plan Note (Signed)
BP Readings from Last 3 Encounters:  07/31/13 117/76  07/04/13 138/74  04/04/13 126/77    Lab Results  Component Value Date   NA 141 04/08/2013   K 4.8 04/08/2013   CREATININE 1.36* 04/08/2013    Assessment: Blood pressure control: mildly elevated Progress toward BP goal:  unable to assess Comments: none  Plan: Medications:  continue current medications (Norvasc 5, HCTZ 25 mg, Lisinopril 40) Educational resources provided: other (see comments) Self management tools provided: other (see comments) Other plans: f/u with PCP

## 2013-08-05 NOTE — Assessment & Plan Note (Signed)
Erythematous rash to right forearm she will f/u with dermatology 08/06/13 for evaluation

## 2013-08-05 NOTE — Patient Instructions (Addendum)
General Instructions: Please go get your xray  Follow up with Dr. Doug Sou (June or July 2015) and dermatology as scheduled Take Tramadol for pain 100 mg every 6 hours as needed for pain    Treatment Goals:  Goals (1 Years of Data) as of 08/05/13         07/31/13 07/04/13 07/04/13 04/04/13 01/03/13     Blood Pressure    . Blood Pressure < 140/90  117/76 138/74 144/83 126/77 145/87     Diet    . Increase water intake- especially when blood sugars are high      Yes     Result Component    . HEMOGLOBIN A1C < 7.0   6.7  6.3     . LDL CALC < 100            Progress Toward Treatment Goals:  Treatment Goal 08/05/2013  Hemoglobin A1C at goal  Blood pressure unable to assess  Stop smoking smoking the same amount    Self Care Goals & Plans:  Self Care Goal 08/05/2013  Manage my medications take my medicines as prescribed; bring my medications to every visit; refill my medications on time; follow the sick day instructions if I am sick  Monitor my health keep track of my blood glucose; bring my glucose meter and log to each visit; keep track of my blood pressure; bring my blood pressure log to each visit; keep track of my weight; check my feet daily  Eat healthy foods drink diet soda or water instead of juice or soda; eat more vegetables; eat foods that are low in salt; eat baked foods instead of fried foods; eat fruit for snacks and desserts; eat smaller portions  Be physically active find an activity I enjoy  Stop smoking call [facility] (1-800-QUIT-NOW)  Meeting treatment goals maintain the current self-care plan    Home Blood Glucose Monitoring 08/05/2013  Check my blood sugar 3 times a day  When to check my blood sugar before meals     Care Management & Community Referrals:  Referral 08/05/2013  Referrals made for care management support none needed  Referrals made to community resources none       DASH Diet The DASH diet stands for "Dietary Approaches to Stop Hypertension." It  is a healthy eating plan that has been shown to reduce high blood pressure (hypertension) in as little as 14 days, while also possibly providing other significant health benefits. These other health benefits include reducing the risk of breast cancer after menopause and reducing the risk of type 2 diabetes, heart disease, colon cancer, and stroke. Health benefits also include weight loss and slowing kidney failure in patients with chronic kidney disease.  DIET GUIDELINES  Limit salt (sodium). Your diet should contain less than 1500 mg of sodium daily.  Limit refined or processed carbohydrates. Your diet should include mostly whole grains. Desserts and added sugars should be used sparingly.  Include small amounts of heart-healthy fats. These types of fats include nuts, oils, and tub margarine. Limit saturated and trans fats. These fats have been shown to be harmful in the body. CHOOSING FOODS  The following food groups are based on a 2000 calorie diet. See your Registered Dietitian for individual calorie needs. Grains and Grain Products (6 to 8 servings daily)  Eat More Often: Whole-wheat bread, brown rice, whole-grain or wheat pasta, quinoa, popcorn without added fat or salt (air popped).  Eat Less Often: White bread, white pasta, white rice, cornbread. Vegetables (  4 to 5 servings daily)  Eat More Often: Fresh, frozen, and canned vegetables. Vegetables may be raw, steamed, roasted, or grilled with a minimal amount of fat.  Eat Less Often/Avoid: Creamed or fried vegetables. Vegetables in a cheese sauce. Fruit (4 to 5 servings daily)  Eat More Often: All fresh, canned (in natural juice), or frozen fruits. Dried fruits without added sugar. One hundred percent fruit juice ( cup [237 mL] daily).  Eat Less Often: Dried fruits with added sugar. Canned fruit in light or heavy syrup. [employer], Fish, and Poultry (2 servings or less daily. One serving is 3 to 4 oz [85-114 g]).  Eat More Often:  Ninety percent or leaner ground beef, tenderloin, sirloin. Round cuts of beef, chicken breast, [place] breast. All fish. Grill, bake, or broil your meat. Nothing should be fried.  Eat Less Often/Avoid: Fatty cuts of meat, [place], or chicken leg, thigh, or wing. Fried cuts of meat or fish. Dairy (2 to 3 servings)  Eat More Often: Low-fat or fat-free milk, low-fat plain or light yogurt, reduced-fat or part-skim cheese.  Eat Less Often/Avoid: Milk (whole, 2%).Whole milk yogurt. Full-fat cheeses. Nuts, Seeds, and Legumes (4 to 5 servings per week)  Eat More Often: All without added salt.  Eat Less Often/Avoid: Salted nuts and seeds, canned beans with added salt. Fats and Sweets (limited)  Eat More Often: Vegetable oils, tub margarines without trans fats, sugar-free gelatin. Mayonnaise and salad dressings.  Eat Less Often/Avoid: Coconut oils, palm oils, butter, stick margarine, cream, half and half, cookies, candy, pie. FOR MORE INFORMATION The Dash Diet Eating Plan: www.dashdiet.org Document Released: 03/30/2011 Document Revised: 07/03/2011 Document Reviewed: 03/30/2011 [facility] Patient Information 2014 [place], [place].  Hypertension As your heart beats, it forces blood through your arteries. This force is your blood pressure. If the pressure is too high, it is called hypertension (HTN) or high blood pressure. HTN is dangerous because you may have it and not know it. High blood pressure may mean that your heart has to work harder to pump blood. Your arteries may be narrow or stiff. The extra work puts you at risk for heart disease, stroke, and other problems.  Blood pressure consists of two numbers, a higher number over a lower, 110/72, for example. It is stated as "110 over 72." The ideal is below 120 for the top number (systolic) and under 80 for the bottom (diastolic). Write down your blood pressure today. You should pay close attention to your blood pressure if you have certain conditions  such as:  Heart failure.  Prior heart attack.  Diabetes  Chronic kidney disease.  Prior stroke.  Multiple risk factors for heart disease. To see if you have HTN, your blood pressure should be measured while you are seated with your arm held at the level of the heart. It should be measured at least twice. A one-time elevated blood pressure reading (especially in the Emergency Department) does not mean that you need treatment. There may be conditions in which the blood pressure is different between your right and left arms. It is important to see your caregiver soon for a recheck. Most people have essential hypertension which means that there is not a specific cause. This type of high blood pressure may be lowered by changing lifestyle factors such as:  Stress.  Smoking.  Lack of exercise.  Excessive weight.  Drug/tobacco/alcohol use.  Eating less salt. Most people do not have symptoms from high blood pressure until it has caused damage to  the body. Effective treatment can often prevent, delay or reduce that damage. TREATMENT  When a cause has been identified, treatment for high blood pressure is directed at the cause. There are a large number of medications to treat HTN. These fall into several categories, and your caregiver will help you select the medicines that are best for you. Medications may have side effects. You should review side effects with your caregiver. If your blood pressure stays high after you have made lifestyle changes or started on medicines,   Your medication(s) may need to be changed.  Other problems may need to be addressed.  Be certain you understand your prescriptions, and know how and when to take your medicine.  Be sure to follow up with your caregiver within the time frame advised (usually within two weeks) to have your blood pressure rechecked and to review your medications.  If you are taking more than one medicine to lower your blood pressure,  make sure you know how and at what times they should be taken. Taking two medicines at the same time can result in blood pressure that is too low. SEEK IMMEDIATE MEDICAL CARE IF:  You develop a severe headache, blurred or changing vision, or confusion.  You have unusual weakness or numbness, or a faint feeling.  You have severe chest or abdominal pain, vomiting, or breathing problems. MAKE SURE YOU:   Understand these instructions.  Will watch your condition.  Will get help right away if you are not doing well or get worse. Document Released: 04/10/2005 Document Revised: 07/03/2011 Document Reviewed: 11/29/2007 [facility] Patient Information 2014 [place].  Type 2 Diabetes Mellitus, Adult Type 2 diabetes mellitus is a long-term (chronic) disease. In type 2 diabetes:  The pancreas does not make enough of a hormone called insulin.  The cells in the body do not respond as well to the insulin that is made.  Both of the above can happen. Normally, insulin moves sugars from food into tissue cells. This gives you energy. If you have type 2 diabetes, sugars cannot be moved into tissue cells. This causes high blood sugar (hyperglycemia).  HOME CARE  Have your hemoglobin A1c level checked twice a year. The level shows if your diabetes is under control or out of control.  Perform daily blood sugar testing as told by your doctor.  Check your ketone levels by testing your pee (urine) when you are sick and as told.  Take your diabetes or insulin medicine as told by your doctor.  Never run out of insulin.  Adjust how much insulin you give yourself based on how many carbs (carbohydrates) you eat. Carbs are in many foods, such as fruits, vegetables, whole grains, and dairy products.  Have a healthy snack between every healthy meal. Have 3 meals and 3 snacks a day.  Lose weight if you are overweight.  Carry a medical alert card or wear your medical alert jewelry.  Carry a 15 gram  carb snack with you at all times. Examples include:  Glucose pills, 3 or 4.  Glucose gel, 15 gram tube.  Raisins, 2 tablespoons (24 grams).  Jelly beans, 6.  Animal crackers, 8.  Sugar pop, 4 ounces (120 milliliters).  Gummy treats, 9.  Notice low blood sugar (hypoglycemia) symptoms, such as:  Shaking (tremors).  Decreased ability to think clearly.  Sweating.  Increased heart rate.  Headache.  Dry mouth.  Hunger.  Crabbiness (irritability).  Being worried or tense (anxiety).  Restless sleep.  A change  in speech or coordination.  Confusion.  Treat low blood sugar right away. If you are alert and can swallow, follow the 15:15 rule:  Take 15 20 grams of a rapid-acting glucose or carb. This includes glucose gel, glucose pills, or 4 ounces (120 milliliters) of fruit juice, regular pop, or low-fat milk.  Check your blood sugar level after taking the glucose.  Take 15 20 grams of more glucose if the repeat blood sugar level is still 70 mg/dL (milligrams/deciliter) or below.  Eat a meal or snack within 1 hour of the blood sugar levels going back to normal.  Notice early symptoms of high blood sugar, such as:  Being really thirsty or drinking a lot (polydipsia).  Peeing (urinating) a lot (polyuria).  Do at least 150 minutes of physical activity a week or as told.  Split the 150 minutes of activity up during the week. Do not do 150 minutes of activity in one day.  Perform exercises, such as weight lifting, at least 2 times a week or as told.  Adjust your insulin or food intake as needed if you start a new exercise or sport.  Follow your sick day plan when you are not able to eat or drink as usual.  Avoid tobacco use.  Women who are not pregnant should drink no more than 1 drink a day. Men should drink no more than 2 drinks a day.  Only drink alcohol with food.  Ask your doctor if alcohol is safe for you.  Tell your doctor if you drink alcohol several  times during the week.  See your doctor regularly.  Schedule an eye exam soon after you are diagnosed with diabetes. Schedule exams once every year.  Check your skin and feet every day. Check for cuts, bruises, redness, nail problems, bleeding, blisters, or sores. A doctor should do a foot exam once a year.  Brush your teeth and gums twice a day. Floss once a day. Visit your dentist regularly.  Share your diabetes plan with your workplace or school.  Stay up-to-date with shots that fight against diseases (immunizations).  Learn how to manage stress.  Get diabetes education and support as needed.  Ask your doctor for special help if:  You need help to maintain or improve how you to do things on your own.  You need help to maintain or improve the quality of your life.  You have foot or hand problems.  You have trouble cleaning yourself, dressing, eating, or doing physical activity. GET HELP RIGHT AWAY IF:  You have trouble breathing.  You have moderate to large ketone levels.  You are unable to eat food or drink fluids for more than 6 hours.  You feel sick to your stomach (nauseous) or throw up (vomit) for more than 6 hours.  Your blood sugar level is over 240 mg/dL.  There is a change in mental status.  You get another serious illness.  You have watery poop (diarrhea) for more than 6 hours.  You have been sick or have had a fever for 2 or more days and are not getting better.  You have pain when you are physically active. MAKE SURE YOU:  Understand these instructions.  Will watch your condition.  Will get help right away if you are not doing well or get worse. Document Released: 01/18/2008 Document Revised: 01/29/2013 Document Reviewed: 08/09/2012 [facility] Patient Information 2014 [place], [place].  Smoking Cessation Quitting smoking is important to your health and has many advantages. However,  it is not always easy to quit since nicotine is a very addictive  drug. Often times, people try 3 times or more before being able to quit. This document explains the best ways for you to prepare to quit smoking. Quitting takes hard work and a lot of effort, but you can do it. ADVANTAGES OF QUITTING SMOKING  You will live longer, feel better, and live better.  Your body will feel the impact of quitting smoking almost immediately.  Within 20 minutes, blood pressure decreases. Your pulse returns to its normal level.  After 8 hours, carbon monoxide levels in the blood return to normal. Your oxygen level increases.  After 24 hours, the chance of having a heart attack starts to decrease. Your breath, hair, and body stop smelling like smoke.  After 48 hours, damaged nerve endings begin to recover. Your sense of taste and smell improve.  After 72 hours, the body is virtually free of nicotine. Your bronchial tubes relax and breathing becomes easier.  After 2 to 12 weeks, lungs can hold more air. Exercise becomes easier and circulation improves.  The risk of having a heart attack, stroke, cancer, or lung disease is greatly reduced.  After 1 year, the risk of coronary heart disease is cut in half.  After 5 years, the risk of stroke falls to the same as a nonsmoker.  After 10 years, the risk of lung cancer is cut in half and the risk of other cancers decreases significantly.  After 15 years, the risk of coronary heart disease drops, usually to the level of a nonsmoker.  If you are pregnant, quitting smoking will improve your chances of having a healthy baby.  The people you live with, especially any children, will be healthier.  You will have extra money to spend on things other than cigarettes. QUESTIONS TO THINK ABOUT BEFORE ATTEMPTING TO QUIT You may want to talk about your answers with your caregiver.  Why do you want to quit?  If you tried to quit in the past, what helped and what did not?  What will be the most difficult situations for you after  you quit? How will you plan to handle them?  Who can help you through the tough times? Your family? Friends? A caregiver?  What pleasures do you get from smoking? What ways can you still get pleasure if you quit? Here are some questions to ask your caregiver:  How can you help me to be successful at quitting?  What medicine do you think would be best for me and how should I take it?  What should I do if I need more help?  What is smoking withdrawal like? How can I get information on withdrawal? GET READY  Set a quit date.  Change your environment by getting rid of all cigarettes, ashtrays, matches, and lighters in your home, car, or work. Do not let people smoke in your home.  Review your past attempts to quit. Think about what worked and what did not. GET SUPPORT AND ENCOURAGEMENT You have a better chance of being successful if you have help. You can get support in many ways.  Tell your family, friends, and co-workers that you are going to quit and need their support. Ask them not to smoke around you.  Get individual, group, or telephone counseling and support. Programs are available at [employer] and health centers. Call your local health department for information about programs in your area.  Spiritual beliefs and practices may help  some smokers quit.  Download a "quit meter" on your computer to keep track of quit statistics, such as how long you have gone without smoking, cigarettes not smoked, and money saved.  Get a self-help book about quitting smoking and staying off of tobacco. [place] yourself from urges to smoke. Talk to someone, go for a walk, or occupy your time with a task.  Change your normal routine. Take a different route to work. Drink tea instead of coffee. Eat breakfast in a different place.  Reduce your stress. Take a hot bath, exercise, or read a book.  Plan something enjoyable to do every day. Reward yourself for  not smoking.  Explore interactive web-based programs that specialize in helping you quit. GET MEDICINE AND USE IT CORRECTLY Medicines can help you stop smoking and decrease the urge to smoke. Combining medicine with the above behavioral methods and support can greatly increase your chances of successfully quitting smoking.  Nicotine replacement therapy helps deliver nicotine to your body without the negative effects and risks of smoking. Nicotine replacement therapy includes nicotine gum, lozenges, inhalers, nasal sprays, and skin patches. Some may be available over-the-counter and others require a prescription.  Antidepressant medicine helps people abstain from smoking, but how this works is unknown. This medicine is available by prescription.  Nicotinic receptor partial agonist medicine simulates the effect of nicotine in your brain. This medicine is available by prescription. Ask your caregiver for advice about which medicines to use and how to use them based on your health history. Your caregiver will tell you what side effects to look out for if you choose to be on a medicine or therapy. Carefully read the information on the package. Do not use any other product containing nicotine while using a nicotine replacement product.  RELAPSE OR DIFFICULT SITUATIONS Most relapses occur within the first 3 months after quitting. Do not be discouraged if you start smoking again. Remember, most people try several times before finally quitting. You may have symptoms of withdrawal because your body is used to nicotine. You may crave cigarettes, be irritable, feel very hungry, cough often, get headaches, or have difficulty concentrating. The withdrawal symptoms are only temporary. They are strongest when you first quit, but they will go away within 10 14 days. To reduce the chances of relapse, try to:  Avoid drinking alcohol. Drinking lowers your chances of successfully quitting.  Reduce the amount of  caffeine you consume. Once you quit smoking, the amount of caffeine in your body increases and can give you symptoms, such as a rapid heartbeat, sweating, and anxiety.  Avoid smokers because they can make you want to smoke.  Do not let weight gain distract you. Many smokers will gain weight when they quit, usually less than 10 pounds. Eat a healthy diet and stay active. You can always lose the weight gained after you quit.  Find ways to improve your mood other than smoking. FOR MORE INFORMATION  www.smokefree.gov  Document Released: 04/04/2001 Document Revised: 10/10/2011 Document Reviewed: 07/20/2011 [facility] Patient Information 2014 [place], [place]. Exercise to Lose Weight Exercise and a healthy diet may help you lose weight. Your doctor may suggest specific exercises. EXERCISE IDEAS AND TIPS  Choose low-cost things you enjoy doing, such as walking, bicycling, or exercising to workout videos.  Take stairs instead of the elevator.  Walk during your lunch break.  Park your car further away from work or school.  Go to a gym or an exercise  class.  Start with 5 to 10 minutes of exercise each day. Build up to 30 minutes of exercise 4 to 6 days a week.  Wear shoes with good support and comfortable clothes.  Stretch before and after working out.  Work out until you breathe harder and your heart beats faster.  Drink extra water when you exercise.  Do not do so much that you hurt yourself, feel dizzy, or get very short of breath. Exercises that burn about 150 calories:  Running 1  miles in 15 minutes.  Playing volleyball for 45 to 60 minutes.  Washing and waxing a car for 45 to 60 minutes.  Playing touch football for 45 minutes.  Walking 1  miles in 35 minutes.  Pushing a stroller 1  miles in 30 minutes.  Playing basketball for 30 minutes.  Raking leaves for 30 minutes.  Bicycling 5 miles in 30 minutes.  Walking 2 miles in 30 minutes.  Dancing for 30  minutes.  Shoveling snow for 15 minutes.  Swimming laps for 20 minutes.  Walking up stairs for 15 minutes.  Bicycling 4 miles in 15 minutes.  Gardening for 30 to 45 minutes.  Jumping rope for 15 minutes.  Washing windows or floors for 45 to 60 minutes. Document Released: 05/13/2010 Document Revised: 07/03/2011 Document Reviewed: 05/13/2010 [facility] Patient Information 2014 [place], [place].   Tramadol tablets What is this medicine? TRAMADOL (TRA ma dole) is a pain reliever. It is used to treat moderate to severe pain in adults. This medicine may be used for other purposes; ask your health care provider or pharmacist if you have questions. COMMON BRAND NAME(S): Ultram What should I tell my health care provider before I take this medicine? They need to know if you have any of these conditions: -brain tumor -depression -drug abuse or addiction -head injury -if you frequently drink alcohol containing drinks -kidney disease or trouble passing urine -liver disease -lung disease, asthma, or breathing problems -seizures or epilepsy -suicidal thoughts, plans, or attempt; a previous suicide attempt by you or a family member -an unusual or allergic reaction to tramadol, codeine, other medicines, foods, dyes, or preservatives -pregnant or trying to get pregnant -breast-feeding How should I use this medicine? Take this medicine by mouth with a full glass of water. Follow the directions on the prescription label. If the medicine upsets your stomach, take it with food or milk. Do not take more medicine than you are told to take. Talk to your pediatrician regarding the use of this medicine in children. Special care may be needed. Overdosage: If you think you have taken too much of this medicine contact a poison control center or emergency room at once. NOTE: This medicine is only for you. Do not share this medicine with others. What if I miss a dose? If you miss a dose, take it as soon as  you can. If it is almost time for your next dose, take only that dose. Do not take double or extra doses. What may interact with this medicine? Do not take this medicine with any of the following medications: -MAOIs like Carbex, Eldepryl, Marplan, Nardil, and Parnate This medicine may also interact with the following medications: -alcohol or medicines that contain alcohol -antihistamines -benzodiazepines -bupropion -carbamazepine or oxcarbazepine -clozapine -cyclobenzaprine -digoxin -furazolidone -linezolid -medicines for depression, anxiety, or psychotic disturbances -medicines for migraine headache like almotriptan, eletriptan, frovatriptan, naratriptan, rizatriptan, sumatriptan, zolmitriptan -medicines for pain like pentazocine, buprenorphine, butorphanol, meperidine, nalbuphine, and propoxyphene -medicines for sleep -muscle relaxants -  naltrexone -phenobarbital -phenothiazines like perphenazine, thioridazine, chlorpromazine, mesoridazine, fluphenazine, prochlorperazine, promazine, and trifluoperazine -procarbazine -warfarin This list may not describe all possible interactions. Give your health care provider a list of all the medicines, herbs, non-prescription drugs, or dietary supplements you use. Also tell them if you smoke, drink alcohol, or use illegal drugs. Some items may interact with your medicine. What should I watch for while using this medicine? Tell your doctor or health care professional if your pain does not go away, if it gets worse, or if you have new or a different type of pain. You may develop tolerance to the medicine. Tolerance means that you will need a higher dose of the medicine for pain relief. Tolerance is normal and is expected if you take this medicine for a long time. Do not suddenly stop taking your medicine because you may develop a severe reaction. Your body becomes used to the medicine. This does NOT mean you are addicted. Addiction is a behavior related  to getting and using a drug for a non-medical reason. If you have pain, you have a medical reason to take pain medicine. Your doctor will tell you how much medicine to take. If your doctor wants you to stop the medicine, the dose will be slowly lowered over time to avoid any side effects. You may get drowsy or dizzy. Do not drive, use machinery, or do anything that needs mental alertness until you know how this medicine affects you. Do not stand or sit up quickly, especially if you are an older patient. This reduces the risk of dizzy or fainting spells. Alcohol can increase or decrease the effects of this medicine. Avoid alcoholic drinks. You may have constipation. Try to have a bowel movement at least every 2 to 3 days. If you do not have a bowel movement for 3 days, call your doctor or health care professional. Your mouth may get dry. Chewing sugarless gum or sucking hard candy, and drinking plenty of water may help. Contact your doctor if the problem does not go away or is severe. What side effects may I notice from receiving this medicine? Side effects that you should report to your doctor or health care professional as soon as possible: -allergic reactions like skin rash, itching or hives, swelling of the face, lips, or tongue -breathing difficulties, wheezing -confusion -itching -light headedness or fainting spells -redness, blistering, peeling or loosening of the skin, including inside the mouth -seizures Side effects that usually do not require medical attention (report to your doctor or health care professional if they continue or are bothersome): -constipation -dizziness -drowsiness -headache -nausea, vomiting This list may not describe all possible side effects. Call your doctor for medical advice about side effects. You may report side effects to FDA at 1-800-FDA-1088. Where should I keep my medicine? Keep out of the reach of children. Store at room temperature between 15 and 30  degrees C (59 and 86 degrees F). Keep container tightly closed. Throw away any unused medicine after the expiration date. NOTE: This sheet is a summary. It may not cover all possible information. If you have questions about this medicine, talk to your doctor, pharmacist, or health care provider.  2014, Elsevier/Gold Standard. (2009-12-22 11:55:44)

## 2013-08-05 NOTE — Addendum Note (Signed)
Addended by: Orson Gear on: 08/05/2013 09:53 AM   Modules accepted: Orders

## 2013-08-06 ENCOUNTER — Encounter: Payer: Self-pay | Admitting: Internal Medicine

## 2013-08-07 NOTE — Progress Notes (Signed)
Case discussed with Dr. McLean at the time of the visit.  We reviewed the resident's history and exam and pertinent patient test results.  I agree with the assessment, diagnosis, and plan of care documented in the resident's note.     

## 2013-09-05 ENCOUNTER — Ambulatory Visit (HOSPITAL_COMMUNITY)
Admission: RE | Admit: 2013-09-05 | Discharge: 2013-09-05 | Disposition: A | Discharge status: Home / Self Care | Payer: Medicare Other | Source: Ambulatory Visit | Attending: Internal Medicine | Admitting: Internal Medicine

## 2013-09-05 ENCOUNTER — Encounter: Payer: Self-pay | Admitting: Internal Medicine

## 2013-09-05 ENCOUNTER — Ambulatory Visit (INDEPENDENT_AMBULATORY_CARE_PROVIDER_SITE_OTHER): Payer: Medicare Other | Admitting: Internal Medicine

## 2013-09-05 VITALS — BP 130/80 | HR 88 | Temp 97.8°F | Wt 195.2 lb

## 2013-09-05 DIAGNOSIS — X58XXXA Exposure to other specified factors, initial encounter: Secondary | ICD-10-CM | POA: Insufficient documentation

## 2013-09-05 DIAGNOSIS — E119 Type 2 diabetes mellitus without complications: Secondary | ICD-10-CM

## 2013-09-05 DIAGNOSIS — D509 Iron deficiency anemia, unspecified: Secondary | ICD-10-CM

## 2013-09-05 DIAGNOSIS — M79609 Pain in unspecified limb: Secondary | ICD-10-CM

## 2013-09-05 DIAGNOSIS — K2270 Barrett's esophagus without dysplasia: Secondary | ICD-10-CM

## 2013-09-05 DIAGNOSIS — D649 Anemia, unspecified: Secondary | ICD-10-CM

## 2013-09-05 DIAGNOSIS — E785 Hyperlipidemia, unspecified: Secondary | ICD-10-CM

## 2013-09-05 DIAGNOSIS — R809 Proteinuria, unspecified: Secondary | ICD-10-CM

## 2013-09-05 DIAGNOSIS — B029 Zoster without complications: Secondary | ICD-10-CM

## 2013-09-05 DIAGNOSIS — D500 Iron deficiency anemia secondary to blood loss (chronic): Secondary | ICD-10-CM

## 2013-09-05 DIAGNOSIS — S92919A Unspecified fracture of unspecified toe(s), initial encounter for closed fracture: Secondary | ICD-10-CM

## 2013-09-05 DIAGNOSIS — I10 Essential (primary) hypertension: Secondary | ICD-10-CM

## 2013-09-05 LAB — CBC WITH DIFFERENTIAL/PLATELET
BASOS PCT: 1 % (ref 0–1)
Basophils Absolute: 0.1 10*3/uL (ref 0.0–0.1)
EOS ABS: 0.5 10*3/uL (ref 0.0–0.7)
Eosinophils Relative: 6 % — ABNORMAL HIGH (ref 0–5)
HCT: 30.3 % — ABNORMAL LOW (ref 36.0–46.0)
Hemoglobin: 9.8 g/dL — ABNORMAL LOW (ref 12.0–15.0)
Lymphocytes Relative: 37 % (ref 12–46)
Lymphs Abs: 3.2 10*3/uL (ref 0.7–4.0)
MCH: 26.5 pg (ref 26.0–34.0)
MCHC: 32.3 g/dL (ref 30.0–36.0)
MCV: 81.9 fL (ref 78.0–100.0)
Monocytes Absolute: 0.5 10*3/uL (ref 0.1–1.0)
Monocytes Relative: 6 % (ref 3–12)
NEUTROS PCT: 50 % (ref 43–77)
Neutro Abs: 4.3 10*3/uL (ref 1.7–7.7)
Platelets: 343 10*3/uL (ref 150–400)
RBC: 3.7 MIL/uL — ABNORMAL LOW (ref 3.87–5.11)
RDW: 17.6 % — ABNORMAL HIGH (ref 11.5–15.5)
WBC: 8.6 10*3/uL (ref 4.0–10.5)

## 2013-09-05 LAB — GLUCOSE, CAPILLARY: Glucose-Capillary: 96 mg/dL (ref 70–99)

## 2013-09-05 MED ORDER — ZOSTER VACCINE LIVE 19400 UNT/0.65ML ~~LOC~~ SOLR: 0.6500 mL | Freq: Once | SUBCUTANEOUS | Status: DC

## 2013-09-05 MED ORDER — FERROUS SULFATE 325 (65 FE) MG PO TABS: 325.0000 mg | ORAL_TABLET | Freq: Two times a day (BID) | ORAL | Status: DC

## 2013-09-05 MED ORDER — FERROUS SULFATE 325 (65 FE) MG PO TABS: 325.0000 mg | ORAL_TABLET | Freq: Every day | ORAL | Status: DC

## 2013-09-05 NOTE — Progress Notes (Signed)
Case discussed with Dr. Emokpae at the time of the visit.  We reviewed the resident's history and exam and pertinent patient test results.  I agree with the assessment, diagnosis, and plan of care documented in the resident's note. 

## 2013-09-05 NOTE — Patient Instructions (Addendum)
General Instructions: We will be doing som elab work today and xray of your toe. We will also be reffering you to an orthopedic doctor- for your toe. Your blood level is low, and we suspect you are loosing some blood from your stomach, we will therefore like you to see your gastroenterologist.   We will be prescribing Iron tablets for you- please take one tablet once a day. If you are having constipation please you can take over the counter stool softners.    Please bring your medicines with you each time you come to clinic.  Medicines may include prescription medications, over-the-counter medications, herbal remedies, eye drops, vitamins, or other pills.   Progress Toward Treatment Goals:  Treatment Goal 08/05/2013  Hemoglobin A1C at goal  Blood pressure unable to assess  Stop smoking smoking the same amount    Self Care Goals & Plans:  Self Care Goal 08/05/2013  Manage my medications take my medicines as prescribed; bring my medications to every visit; refill my medications on time; follow the sick day instructions if I am sick  Monitor my health keep track of my blood glucose; bring my glucose meter and log to each visit; keep track of my blood pressure; bring my blood pressure log to each visit; keep track of my weight; check my feet daily  Eat healthy foods drink diet soda or water instead of juice or soda; eat more vegetables; eat foods that are low in salt; eat baked foods instead of fried foods; eat fruit for snacks and desserts; eat smaller portions  Be physically active find an activity I enjoy  Stop smoking call [facility] (1-800-QUIT-NOW)  Meeting treatment goals maintain the current self-care plan    Home Blood Glucose Monitoring 08/05/2013  Check my blood sugar 3 times a day  When to check my blood sugar before meals     Care Management & Community Referrals:  Referral 08/05/2013  Referrals made for care management support none needed  Referrals made to community  resources none

## 2013-09-05 NOTE — Assessment & Plan Note (Signed)
Urinalysis- >300 of protein- 11/21/2012, marked elevated urine Microalb/cr ratio- 09/06/2012- 912. Pt is already on Max dose of Lisinopril- 40mg  daily.  Plan- Urine Micro-alb/Cr ratio today.

## 2013-09-05 NOTE — Progress Notes (Signed)
Patient ID: [name], female   DOB: [birth year], 68 y.o.   MRN: [id number]   Subjective:   Patient ID: [name] female   DOB: 12-24-1945 68 y.o.   MRN: [id number]  HPI: [name] is a 68 y.o. with PMH of HTN, GERD, DM2, HLD, Zoster. Presented today with complaints of Left toepain and swelling  that started in April. Pt says she was walking her dog when the dog went around her leg twisting the dog chain around her ankles, she subsequently almost fell, and put all her weigh on lateral side of her left foot. She came to the [facility] clinic- the next day, and had an xray done which showed a displaced fracture. Pt was prescribed tramadol, which she was intially taking 1 tab- q6H, but now taking 2-3 tabs Q4H. Since incident pain and swelling have not improved.     Past Medical History  Diagnosis Date  . Diabetes mellitus   . Asthma     childhood  . Depression   . Anxiety   . Hypertension   . Hyperlipidemia   . 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   Marland Kitchen Hx MRSA infection 2009  . Dysphagia   . Personal history of colonic polyps 10/17/2010    hyperplastic  . Barrett's esophagus   . Hiatal hernia    Current Outpatient Prescriptions  Medication Sig Dispense Refill  . ACCU-CHEK FASTCLIX LANCETS MISC 1 Device by Does not apply route 4 (four) times daily -  before meals and at bedtime. Dx: 250.00  100 each  11  . ACCU-CHEK SMARTVIEW test strip USE AS DIRECTED  100 each  12  . ALPRAZolam (XANAX) 0.5 MG tablet Take 1 tablet (0.5 mg total) by mouth 2 (two) times daily as needed for anxiety.  60 tablet  2  . amLODipine (NORVASC) 5 MG tablet Take 1 tablet (5 mg total) by mouth daily.  30 tablet  11  . aspirin EC 81 MG tablet Take 1 tablet (81 mg total) by mouth daily.  30 tablet  5  . hydrochlorothiazide (HYDRODIURIL) 25 MG tablet Take 1 tablet (25 mg total) by mouth daily.  30 tablet  6  . insulin aspart protamine-  aspart (NOVOLOG MIX 70/30) (70-30) 100 UNIT/ML injection Inject 0.1 mLs (10 Units total) into the skin 2 (two) times daily with a meal.  15 mL  12  . Insulin Pen Needle (PEN NEEDLES 3/16") 31G X 5 MM MISC Use twice daily to inject insulin. diag code 250.60. Insulin dependent  100 each  5  . Insulin Syringe-Needle U-100 (INSULIN SYRINGE .5CC/31GX5/16") 31G X 5/16" 0.5 ML MISC 1 each by Does not apply route 2 (two) times daily before a meal.  100 each  3  . latanoprost (XALATAN) 0.005 % ophthalmic solution       . lisinopril (PRINIVIL,ZESTRIL) 40 MG tablet Take 1 tablet (40 mg total) by mouth daily.  30 tablet  6  . loratadine (CLARITIN) 10 MG tablet Take 1 tablet (10 mg total) by mouth daily.  30 tablet  0  . Melatonin 1 MG TABS Take 1 tablet (1 mg total) by mouth at bedtime.  30 tablet  3  . metFORMIN (GLUCOPHAGE) 1000 MG tablet Take 1 tablet (1,000 mg total) by mouth 2 (two) times daily with a meal. Break pills in half for easier swallowing.  60 tablet  6  . omeprazole (PRILOSEC)  40 MG capsule Take 1 capsule (40 mg total) by mouth daily.  30 capsule  2  . PARoxetine (PAXIL) 40 MG tablet Take 1 tablet (40 mg total) by mouth daily.  30 tablet  6  . pravastatin (PRAVACHOL) 40 MG tablet TAKE ONE TABLET BY MOUTH ONCE DAILY  30 tablet  6  . traMADol (ULTRAM) 50 MG tablet Take 2 tablets (100 mg total) by mouth every 6 (six) hours as needed. Up to 4 times a day for pain  60 tablet  0  . zolpidem (AMBIEN) 5 MG tablet Take 1 tablet (5 mg total) by mouth at bedtime as needed for sleep.  30 tablet  5  . zoster vaccine live, PF, (ZOSTAVAX) 16109 UNT/0.65ML injection Inject 19,400 Units into the skin once.  1 each  0   No current facility-administered medications for this visit.   Family History  Problem Relation Age of Onset  . Colon cancer Neg Hx    History   Social History  . Marital Status: Divorced    Spouse Name: N/A    Number of Children: 0  . Years of Education: N/A   Occupational History  .  Retired    Social History Main Topics  . Smoking status: Current Every Day Smoker -- 0.25 packs/day for 40 years    Types: Cigarettes  . Smokeless tobacco: Never Used  . Alcohol Use: 1.0 oz/week    2 drink(s) per week     Comment: wine  . Drug Use: No  . Sexual Activity: None   Other Topics Concern  . None   Social History Narrative   Financial assistance approved for 100% discount at [facility] and has [facility] card per [employer]   02/10/2010      3 caffeine drinks daily    Review of Systems: CONSTITUTIONAL- No Fever, weightloss, night sweat or change in appetite, but complaints of weakness. SKIN- Rash- present on extremities, sees a dermatologist, colour changes itching. HEAD- No Headache, or dizziness. EYES- No Vision loss, pain, redness, double or blurred vision. RESPIRATORY- No Cough, or SOB. CARDIAC- No Palpitations, DOE, PND, or chest pain. GI- likes to eat ice, and has for several years she says, Dysphagia, nausea, vomiting, diarrhoea, constipation, abd pain. URINARY- No Frequency, or dysuria NEUROLOGIC- No Numbness, syncope, or burning. [facility]- Denies depression or anxiety.  Objective:  Physical Exam: Filed Vitals:   09/05/13 0940  BP: 156/87  Pulse: 100  Temp: 97.8 F (36.6 C)  TempSrc: Oral  Weight: 195 lb 3.2 oz (88.542 kg)  SpO2: 98%   GENERAL- alert, co-operative, appears as stated age, not in any distress. HEENT- Atraumatic, normocephalic, PERRL, EOMI, oral mucosa appears moist. CARDIAC- Tachycardic- mildy- 100, but regular, no murmurs, rubs or gallops. RESP- Moving equal volumes of air, and clear to auscultation bilaterally. ABDOMEN- Soft,non tender, no palpable masses or organomegaly, bowel sounds present. BACK- Normal curvature of the spine, No tenderness along the vertebrae, no CVA tenderness. NEURO- No obvious Cr N abnormlaity, strenght equal and present in all extremities EXTREMITIES- pulse 2+, symmetric, no pedal edema, tenderness and  swelling  compared to right. SKIN- Warm, dry, rash- multiple hyperpigmented plaque like lesions on lower extremities- sees a dermatologist. PSYCH- Normal mood and affect, appropriate thought content and speech.  Assessment & Plan:   The patient's case and plan of care was discussed with attending physician, Dr. Lenna Sciara. Marinda Elk.  Please see problem based Chatting for assessment and plan.

## 2013-09-05 NOTE — Assessment & Plan Note (Signed)
Persistent pain and swelling since last visit 4 weeks ago.  Plan- Xray foot today. Orthopedic refferal.

## 2013-09-05 NOTE — Assessment & Plan Note (Signed)
Well controlled. Initially- 156/87. On recheck- 130/80.  Plan- Continue current meds.

## 2013-09-05 NOTE — Assessment & Plan Note (Signed)
Last LDL not at goal, TG also elevated. Pt on pravastatin- 40mg  daily  Plan- Lipid profile check today, consider changing to high intensity statin.

## 2013-09-05 NOTE — Assessment & Plan Note (Addendum)
Pt needs to have another endoscopy to assess Gi blood loss, as prior barrets and hemorrhagic gastritis on prior endoscopy. Evaluation also for GI blood loss, and also due for rescreening.  Plan- Ambulatory refferal to GI Stool Cards given today X3. Continue PPI.

## 2013-09-05 NOTE — Assessment & Plan Note (Signed)
Gave pt prescription for shingles today.

## 2013-09-05 NOTE — Assessment & Plan Note (Addendum)
Assessment- Anemia likely due to GI blood loss- hemorrhagic gastritis, diverticulosis. Appears to have been present for several years. Last CBC showed a Hgb- 8.7- 07/2013, down from 10.3- 03/2013.  RDW- Increased at 17.5, MCV, MCH, MCHC all at the lower limits of normal.  Anemia panel done in 2012, showed a low ferritin of 40.  Pt endorses weakness and PICA- Ice for several years, has had a hysterectomy, and denies melana or blood in stool, hematemesis, and epigastric pain. Pt had an endoscopy done in 2012- Showed hemorrhagic gastritis and barrets oesophagus- 10cm segment, biopsy showed No dysplasia, but pt is due for another endoscopy.  Last colonoscopy was in 2012 also, diverticulosis and showed multiple Polyps in the rectum- biopsy results- Hyperplastic, another endoscopy was recommended in 5-10 years.  Plan- Anemia Panel CBC Ferrous sulphate- 325 daily, can increase as tolerated.

## 2013-09-06 LAB — ANEMIA PANEL
%SAT: 53 % (ref 20–55)
ABS RETIC: 81.4 10*3/uL (ref 19.0–186.0)
FERRITIN: 11 ng/mL (ref 10–291)
Iron: 236 ug/dL — ABNORMAL HIGH (ref 42–145)
RBC.: 3.7 MIL/uL — ABNORMAL LOW (ref 3.87–5.11)
Retic Ct Pct: 2.2 % (ref 0.4–2.3)
TIBC: 447 ug/dL (ref 250–470)
UIBC: 211 ug/dL (ref 125–400)
Vitamin B-12: 852 pg/mL (ref 211–911)

## 2013-09-06 LAB — LIPID PANEL
CHOLESTEROL: 156 mg/dL (ref 0–200)
HDL: 49 mg/dL (ref >39)
LDL Cholesterol: 75 mg/dL (ref 0–99)
Total CHOL/HDL Ratio: 3.2 Ratio
Triglycerides: 162 mg/dL — ABNORMAL HIGH (ref <150)
VLDL: 32 mg/dL (ref 0–40)

## 2013-09-06 LAB — MICROALBUMIN / CREATININE URINE RATIO
CREATININE, URINE: 148.1 mg/dL
MICROALB UR: 59.24 mg/dL — AB (ref 0.00–1.89)
MICROALB/CREAT RATIO: 400 mg/g — AB (ref 0.0–30.0)

## 2013-09-09 ENCOUNTER — Other Ambulatory Visit (INDEPENDENT_AMBULATORY_CARE_PROVIDER_SITE_OTHER): Payer: Medicare Other

## 2013-09-09 DIAGNOSIS — D649 Anemia, unspecified: Secondary | ICD-10-CM

## 2013-09-09 LAB — POC HEMOCCULT BLD/STL (HOME/3-CARD/SCREEN)
Card #2 Fecal Occult Blod, POC: NEGATIVE
Card #3 Fecal Occult Blood, POC: NEGATIVE
Fecal Occult Blood, POC: NEGATIVE

## 2013-09-18 ENCOUNTER — Encounter: Payer: Self-pay | Admitting: Gastroenterology

## 2013-09-29 ENCOUNTER — Other Ambulatory Visit: Payer: Self-pay | Admitting: Internal Medicine

## 2013-10-12 ENCOUNTER — Telehealth: Payer: Self-pay | Admitting: Internal Medicine

## 2013-10-12 DIAGNOSIS — F419 Anxiety disorder, unspecified: Secondary | ICD-10-CM

## 2013-10-12 MED ORDER — ALPRAZOLAM 0.5 MG PO TABS: 0.5000 mg | ORAL_TABLET | Freq: Two times a day (BID) | ORAL | Status: DC | PRN

## 2013-10-12 NOTE — Telephone Encounter (Signed)
Pt find her Xanax and she is in a " tissy" family has stressed out today and feels like she is falling apart and she wants something to calm her down.  She cant find her Xanax last filled 03/2013 with 2 refills.  She denies SI/HI.  She feels like she is falling apart.  Advised the clinic and pharmacy are closed until tomorrow.  She states she will sit it out until tomorrow morning.  Advised will get triage to call into pharmacy tomorrow.  She can try deep breathing, calming music rest until 10/13/13  Aundra Dubin MD

## 2013-10-13 NOTE — Telephone Encounter (Signed)
Rx called in to pharmacy per Dr Aundra Dubin. Hilda Blades Ditzler RN 10/13/13 10AM

## 2013-11-04 ENCOUNTER — Ambulatory Visit (AMBULATORY_SURGERY_CENTER): Payer: Self-pay

## 2013-11-04 VITALS — Ht 63.5 in | Wt 198.6 lb

## 2013-11-04 DIAGNOSIS — K2270 Barrett's esophagus without dysplasia: Secondary | ICD-10-CM

## 2013-11-04 NOTE — Progress Notes (Signed)
Per pt, no allergies to soy or egg products.Pt not taking any weight loss meds or using  O2 at home. 

## 2013-11-05 ENCOUNTER — Other Ambulatory Visit: Payer: Self-pay | Admitting: Internal Medicine

## 2013-11-05 NOTE — Telephone Encounter (Signed)
Pls sch appt with PCP routine F/U and F/U from GI appt. She has openings in Aug

## 2013-11-13 ENCOUNTER — Encounter: Payer: Self-pay | Admitting: Gastroenterology

## 2013-11-18 ENCOUNTER — Encounter: Payer: Self-pay | Admitting: Gastroenterology

## 2013-11-18 ENCOUNTER — Ambulatory Visit (AMBULATORY_SURGERY_CENTER): Payer: Medicare Other | Admitting: Gastroenterology

## 2013-11-18 VITALS — BP 150/84 | HR 76 | Temp 97.5°F | Resp 15 | Ht 63.5 in | Wt 198.0 lb

## 2013-11-18 DIAGNOSIS — K2270 Barrett's esophagus without dysplasia: Secondary | ICD-10-CM

## 2013-11-18 DIAGNOSIS — K449 Diaphragmatic hernia without obstruction or gangrene: Secondary | ICD-10-CM

## 2013-11-18 LAB — GLUCOSE, CAPILLARY
Glucose-Capillary: 122 mg/dL — ABNORMAL HIGH (ref 70–99)
Glucose-Capillary: 121 mg/dL — ABNORMAL HIGH (ref 70–99)

## 2013-11-18 MED ORDER — SODIUM CHLORIDE 0.9 % IV SOLN: 500.0000 mL | INTRAVENOUS | Status: DC

## 2013-11-18 NOTE — Progress Notes (Signed)
Awakening , vss, report to rn

## 2013-11-18 NOTE — Patient Instructions (Addendum)
One of your biggest health concerns is your smoking.  This increases your risk for most cancers and serious cardiovascular diseases such as strokes, heart attacks.  You should try your best to stop.  If you need assistance, please contact your PCP or Smoking Cessation Class at [facility] [phone]) or [facility] (1-800-QUIT-NOW).  YOU HAD AN ENDOSCOPIC PROCEDURE TODAY AT [facility]: Refer to the procedure report that was given to you for any specific questions about what was found during the examination.  If the procedure report does not answer your questions, please call your gastroenterologist to clarify.  If you requested that your care partner not be given the details of your procedure findings, then the procedure report has been included in a sealed envelope for you to review at your convenience later.  YOU SHOULD EXPECT: Some feelings of bloating in the abdomen. Passage of more gas than usual.  Walking can help get rid of the air that was put into your GI tract during the procedure and reduce the bloating.Marland Kitchen  DIET: Your first meal following the procedure should be a light meal and then it is ok to progress to your normal diet.  A half-sandwich or bowl of soup is an example of a good first meal.  Heavy or fried foods are harder to digest and may make you feel nauseous or bloated.  Likewise meals heavy in dairy and vegetables can cause extra gas to form and this can also increase the bloating.  Drink plenty of fluids but you should avoid alcoholic beverages for 24 hours.  ACTIVITY: Your care partner should take you home directly after the procedure.  You should plan to take it easy, moving slowly for the rest of the day.  You can resume normal activity the day after the procedure however you should NOT DRIVE or use heavy machinery for 24 hours (because of the sedation medicines used during the test).    SYMPTOMS TO REPORT IMMEDIATELY: A gastroenterologist can be  reached at any hour.  During normal business hours, 8:30 AM to 5:00 PM Monday through Friday, call [phone].  After hours and on weekends, please call the GI answering service at [phone] who will take a message and have the physician on call contact you.   Following upper endoscopy (EGD)  Vomiting of blood or coffee ground material  New chest pain or pain under the shoulder blades  Painful or persistently difficult swallowing  New shortness of breath  Fever of 100F or higher  Black, tarry-looking stools  FOLLOW UP: If any biopsies were taken you will be contacted by phone or by letter within the next 1-3 weeks.  Call your gastroenterologist if you have not heard about the biopsies in 3 weeks.  Our staff will call the home number listed on your records the next business day following your procedure to check on you and address any questions or concerns that you may have at that time regarding the information given to you following your procedure. This is a courtesy call and so if there is no answer at the home number and we have not heard from you through the emergency physician on call, we will assume that you have returned to your regular daily activities without incident.  SIGNATURES/CONFIDENTIALITY: You and/or your care partner have signed paperwork which will be entered into your electronic medical record.  These signatures attest to the fact that that the information above on your After Visit Summary has been  reviewed and is understood.  Full responsibility of the confidentiality of this discharge information lies with you and/or your care-partner.  Await pathology  Continue your normal medications

## 2013-11-18 NOTE — Op Note (Signed)
[place]  [address] [place], [address]   ENDOSCOPY PROCEDURE REPORT  PATIENT: [name], [name]  MR#: [id number] BIRTHDATE: October 28, 1945 , 67  yrs. old GENDER: Female ENDOSCOPIST: Milus Banister, MD PROCEDURE DATE:  11/18/2013 PROCEDURE:  EGD w/ biopsy ASA CLASS:     Class III INDICATIONS:  EGD 2012 Dr.  Sharlett Iles, long segment of Barrett's; path showed IM without dysplasia, recommended to have repeat EGD at 3 year interval. MEDICATIONS: MAC sedation, administered by CRNA and propofol (Diprivan) 400mg  IV TOPICAL ANESTHETIC: none  DESCRIPTION OF PROCEDURE: After the risks benefits and alternatives of the procedure were thoroughly explained, informed consent was obtained.  The LB [id number] endoscope was introduced through the mouth and advanced to the second portion of the duodenum. Without limitations.  The instrument was slowly withdrawn as the mucosa was fully examined.   There was a long segment of non-nodular Barrett's change from GE junction (30cm) to proximal esophagus (18cm from incisors).  This was sampled in four quadrants every two CM and sent in separate sample jars to pathology.  There was a 6cm hiatal hernia with Cameron's appearing erosions.  There was mild, non-specific gastritis.  The examination was otherwise normal.  Retroflexed views revealed no abnormalities.     The scope was then withdrawn from the patient and the procedure completed.  COMPLICATIONS: There were no complications. ENDOSCOPIC IMPRESSION: There was a long segment of non-nodular Barrett's change from GE junction (30cm) to proximal esophagus (18cm from incisors).  This was sampled in four quadrants every two CM and sent in separate sample jars to pathology.  There was a 6cm hiatal hernia with Cameron's appearing erosions.  There was mild, non-specific gastritis.  The examination was otherwise normal.  RECOMMENDATIONS: Await final pathology of Barrett's  change.  You will likely need repeat EGD in 3 years.  You should continue PPI (prilosec) once daily.   eSigned:  Milus Banister, MD 11/18/2013 9:44 AM

## 2013-11-18 NOTE — Progress Notes (Signed)
Called to room to assist during endoscopic procedure.  Patient ID and intended procedure confirmed with present staff. Received instructions for my participation in the procedure from the performing physician.  

## 2013-11-19 ENCOUNTER — Telehealth: Payer: Self-pay

## 2013-11-19 NOTE — Telephone Encounter (Signed)
[name] returned our call from this am.  She said, "I am still kicking".  She did say she was sore under her chin, but no other problems.  I advised her if soreness does not improve to call us back, to give it a few days.  Pt said, "it maybe how I slept on it".  maw

## 2013-11-19 NOTE — Telephone Encounter (Signed)
Left a message at [phone] for the pt to call back if any questions or concerns. maw

## 2013-11-21 ENCOUNTER — Other Ambulatory Visit: Payer: Self-pay | Admitting: Internal Medicine

## 2013-11-24 ENCOUNTER — Encounter: Payer: Self-pay | Admitting: Gastroenterology

## 2013-11-27 ENCOUNTER — Other Ambulatory Visit: Payer: Self-pay | Admitting: *Deleted

## 2013-11-27 DIAGNOSIS — F419 Anxiety disorder, unspecified: Secondary | ICD-10-CM

## 2013-11-27 MED ORDER — ALPRAZOLAM 0.5 MG PO TABS: 0.5000 mg | ORAL_TABLET | Freq: Two times a day (BID) | ORAL | Status: DC | PRN

## 2013-11-27 NOTE — Telephone Encounter (Signed)
Rx called in 

## 2013-12-02 ENCOUNTER — Other Ambulatory Visit: Payer: Self-pay | Admitting: Internal Medicine

## 2013-12-05 ENCOUNTER — Other Ambulatory Visit: Payer: Self-pay | Admitting: Internal Medicine

## 2013-12-05 MED ORDER — INSULIN ASPART PROT & ASPART (70-30 MIX) 100 UNIT/ML PEN: 10.0000 [IU] | PEN_INJECTOR | Freq: Two times a day (BID) | SUBCUTANEOUS | Status: DC

## 2013-12-06 ENCOUNTER — Other Ambulatory Visit: Payer: Self-pay | Admitting: Internal Medicine

## 2013-12-18 ENCOUNTER — Ambulatory Visit (INDEPENDENT_AMBULATORY_CARE_PROVIDER_SITE_OTHER): Payer: Medicare Other | Admitting: Internal Medicine

## 2013-12-18 ENCOUNTER — Encounter: Payer: Self-pay | Admitting: Internal Medicine

## 2013-12-18 VITALS — BP 136/91 | HR 94 | Temp 97.3°F | Wt 202.5 lb

## 2013-12-18 DIAGNOSIS — K219 Gastro-esophageal reflux disease without esophagitis: Secondary | ICD-10-CM

## 2013-12-18 DIAGNOSIS — F329 Major depressive disorder, single episode, unspecified: Secondary | ICD-10-CM

## 2013-12-18 DIAGNOSIS — F3289 Other specified depressive episodes: Secondary | ICD-10-CM

## 2013-12-18 DIAGNOSIS — I10 Essential (primary) hypertension: Secondary | ICD-10-CM

## 2013-12-18 DIAGNOSIS — E1149 Type 2 diabetes mellitus with other diabetic neurological complication: Secondary | ICD-10-CM

## 2013-12-18 DIAGNOSIS — E785 Hyperlipidemia, unspecified: Secondary | ICD-10-CM

## 2013-12-18 LAB — POCT GLYCOSYLATED HEMOGLOBIN (HGB A1C): Hemoglobin A1C: 6.4

## 2013-12-18 LAB — GLUCOSE, CAPILLARY: Glucose-Capillary: 105 mg/dL — ABNORMAL HIGH (ref 70–99)

## 2013-12-18 MED ORDER — PRAVASTATIN SODIUM 40 MG PO TABS: ORAL_TABLET | ORAL | Status: DC

## 2013-12-18 MED ORDER — OMEPRAZOLE 40 MG PO CPDR: 40.0000 mg | DELAYED_RELEASE_CAPSULE | Freq: Every day | ORAL | Status: DC

## 2013-12-18 MED ORDER — ZOLPIDEM TARTRATE 5 MG PO TABS: ORAL_TABLET | ORAL | Status: DC

## 2013-12-18 MED ORDER — HYDROCHLOROTHIAZIDE 25 MG PO TABS: ORAL_TABLET | ORAL | Status: DC

## 2013-12-18 MED ORDER — LISINOPRIL 40 MG PO TABS: ORAL_TABLET | ORAL | Status: DC

## 2013-12-18 MED ORDER — AMLODIPINE BESYLATE 5 MG PO TABS: ORAL_TABLET | ORAL | Status: DC

## 2013-12-18 MED ORDER — FERROUS SULFATE 325 (65 FE) MG PO TABS: 325.0000 mg | ORAL_TABLET | Freq: Two times a day (BID) | ORAL | Status: DC

## 2013-12-18 MED ORDER — PAROXETINE HCL 40 MG PO TABS: 40.0000 mg | ORAL_TABLET | Freq: Every day | ORAL | Status: DC

## 2013-12-18 NOTE — Progress Notes (Signed)
Subjective:     Patient ID: [name], female   DOB: 1945/06/09, 68 y.o.   MRN: [id number]  HPI [name] is a 68 yo woman with HTN, DMII, CKD, hyperlipidemia, Barrett's esophagus and depression and is here for medication refills and a question about a small bump on her left lower extremity that she noticed earlier this summer. She recently had an endoscopy to investigate her chronic iron deficiency anemia showing Barrett's esophagus. Her GI doctor recommended continuing her PPI; she also takes iron supplements.   Review of Systems General: no recent illness, recent endoscopy Skin: bump on left leg, eczema HEENT: occasional headaches with no aura, visual changes or associated n/v Cardiac: no chest pain or palpitations Respiratory: no shortness of breath GI: no changes in BMs Urinary: no changes in urination Msk: no joint pain Endocrine: no changes Psychiatric: increased stressors at home; has run out of medication for depression     Objective:   Physical Exam Appearance: in NAD, sitting in waiting room with towel around shoulders HEENT: AT/[place], PERRL, EOMi Heart: RRR, normal S1S2 Lungs: CTAB, no wheezes Abdomen: BS+, nontender Musculoskeletal: no joint swelling Extremities: small, nontender, nonfixed, nonpulsating nodule on ventral left lower extremity at location of one of the patient's depigmented areas, no edema Neurologic: A&Ox3, strength and sensation intact throughout     Assessment:     [name] is doing well, with good diabetes control (A1c 6.4% today) here for medication refills. Her lower extremity nodule is not particularly concerning, as it is nontender, nonfixed, soft and nonpulsating.     Plan:     DMII: A1c 6.4% today with very rare hypoglycemic readings at home - patient advised to let us know if she starts having more drops in FS; if so, could consider dropping her insulin dose  HTN and HLD: - medication refills today  Depression: - medication  refill today  Lower extremity nodule: unclear cause - patient advised to come back if nodule grows, develops an ulcer or starts to hurt; at that time, we could ultrasound or image

## 2013-12-18 NOTE — Patient Instructions (Signed)
Please call the clinic if the bump on your left leg gets bigger, starts to hurt, or develops a cut or ulcer.   Please call the clinic if you start having more drops in your finger stick blood sugar.  Your prescriptions have been refilled as you requested.

## 2014-01-02 ENCOUNTER — Other Ambulatory Visit: Payer: Self-pay | Admitting: Internal Medicine

## 2014-01-05 NOTE — Telephone Encounter (Signed)
Rx called in 

## 2014-01-30 ENCOUNTER — Other Ambulatory Visit: Payer: Self-pay | Admitting: *Deleted

## 2014-01-30 NOTE — Telephone Encounter (Signed)
Ordered in Aug. Should have refils

## 2014-02-12 ENCOUNTER — Ambulatory Visit (INDEPENDENT_AMBULATORY_CARE_PROVIDER_SITE_OTHER): Payer: Medicare Other | Admitting: Internal Medicine

## 2014-02-12 ENCOUNTER — Encounter: Payer: Self-pay | Admitting: Internal Medicine

## 2014-02-12 VITALS — BP 143/80 | HR 87 | Temp 98.4°F | Ht 63.5 in | Wt 200.2 lb

## 2014-02-12 DIAGNOSIS — Z23 Encounter for immunization: Secondary | ICD-10-CM

## 2014-02-12 DIAGNOSIS — K219 Gastro-esophageal reflux disease without esophagitis: Secondary | ICD-10-CM

## 2014-02-12 MED ORDER — MECLIZINE HCL 12.5 MG PO TABS: 12.5000 mg | ORAL_TABLET | Freq: Three times a day (TID) | ORAL | Status: DC | PRN

## 2014-02-12 NOTE — Patient Instructions (Signed)
Thank you for your visit today.   Please return to the internal medicine clinic in December.    Your current medical regimen is effective;  continue present plan and take all medications as prescribed.    I have made the following additions/changes to your medications:  I have sent you a prescription for meclizine to your pharmacy.  You may take this as needed for dizziness.  Please also take your prilosec for your acid reflux. Please call the clinic if your symptoms do not improve.   Please be sure to bring all of your medications with you to every visit; this includes herbal supplements, vitamins, eye drops, and any over-the-counter medications.   Should you have any questions regarding your medications and/or any new or worsening symptoms, please be sure to call the clinic at [phone].   If you believe that you are suffering from a life threatening condition or one that may result in the loss of limb or function, then you should call 911 or proceed to the nearest Emergency Department.   Vertigo Vertigo means you feel like you are moving when you are not. Vertigo can make you feel like things around you are moving when they are not. This problem often goes away on its own.  HOME CARE   Follow your doctor's instructions.  Avoid driving.  Avoid using heavy machinery.  Avoid doing any activity that could be dangerous if you have a vertigo attack.  Tell your doctor if a medicine seems to cause your vertigo. GET HELP RIGHT AWAY IF:   Your medicines do not help or make you feel worse.  You have trouble talking or walking.  You feel weak or have trouble using your arms, hands, or legs.  You have bad headaches.  You keep feeling sick to your stomach (nauseous) or throwing up (vomiting).  Your vision changes.  A family member notices changes in your behavior.  Your problems get worse. MAKE SURE YOU:  Understand these instructions.  Will watch your condition.  Will  get help right away if you are not doing well or get worse. Document Released: 01/18/2008 Document Revised: 07/03/2011 Document Reviewed: 10/27/2010 [facility] Patient Information 2015 [place], [place]. This information is not intended to replace advice given to you by your health care provider. Make sure you discuss any questions you have with your health care provider.

## 2014-02-12 NOTE — Progress Notes (Signed)
Patient ID: [name], female   DOB: 1946-01-02, 68 y.o.   MRN: [id number]    Subjective:   Patient ID: [name] female    DOB: 1945/12/31 68 y.o.    MRN: [id number] Health Maintenance Due: Health Maintenance Due  Topic Date Due  . Zostavax  02/28/2006    _________________________________________________  HPI: [name] is a 68 y.o. female here for an acute visit.  Pt has a PMH outlined below.  Please see problem-based charting assessment and plan note for further details of medical issues addressed at today's visit.  PMH: Past Medical History  Diagnosis Date  . Diabetes mellitus   . Asthma     childhood  . Depression   . Anxiety   . Hypertension   . Hyperlipidemia   . 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   Marland Kitchen Hx MRSA infection 2009  . Dysphagia   . Personal history of colonic polyps 10/17/2010    hyperplastic  . Barrett's esophagus   . Hiatal hernia     Medications: Current Outpatient Prescriptions on File Prior to Visit  Medication Sig Dispense Refill  . ACCU-CHEK FASTCLIX LANCETS MISC 1 Device by Does not apply route 4 (four) times daily -  before meals and at bedtime. Dx: 250.00  100 each  11  . ACCU-CHEK SMARTVIEW test strip USE AS DIRECTED  100 each  12  . ALPRAZolam (XANAX) 0.5 MG tablet TAKE ONE TABLET BY MOUTH TWICE DAILY AS NEEDED FOR ANXIETY  60 tablet  0  . amLODipine (NORVASC) 5 MG tablet TAKE ONE TABLET BY MOUTH ONCE DAILY  30 tablet  6  . aspirin EC 81 MG tablet Take 1 tablet (81 mg total) by mouth daily.  30 tablet  5  . clobetasol cream (TEMOVATE) 0.05 %       . ferrous sulfate 325 (65 FE) MG tablet Take 1 tablet (325 mg total) by mouth 2 (two) times daily.  180 tablet  2  . FLUoxetine (PROZAC) 40 MG capsule as needed.       . hydrochlorothiazide (HYDRODIURIL) 25 MG tablet TAKE ONE TABLET BY MOUTH ONCE DAILY  30 tablet  2  . Insulin Aspart Prot & Aspart  (NOVOLOG 70/30 MIX) (70-30) 100 UNIT/ML Pen Inject 10 Units into the skin 2 (two) times daily. diag code 250.60. Insulin dependent  45 mL  4  . Insulin Pen Needle (PEN NEEDLES 3/16") 31G X 5 MM MISC Use twice daily to inject insulin. diag code 250.60. Insulin dependent  100 each  5  . Insulin Syringe-Needle U-100 (INSULIN SYRINGE .5CC/31GX5/16") 31G X 5/16" 0.5 ML MISC 1 each by Does not apply route 2 (two) times daily before a meal.  100 each  3  . latanoprost (XALATAN) 0.005 % ophthalmic solution Place 1 drop into both eyes daily.       Marland Kitchen lisinopril (PRINIVIL,ZESTRIL) 40 MG tablet TAKE ONE TABLET BY MOUTH ONCE DAILY  30 tablet  2  . loratadine (CLARITIN) 10 MG tablet Take 10 mg by mouth as needed.      . Melatonin 1 MG TABS Take 1 tablet (1 mg total) by mouth at bedtime.  30 tablet  3  . metFORMIN (GLUCOPHAGE) 1000 MG tablet TAKE 1 TABLET BY MOUTH TWICED AILY WITH MEALS. MAY BREAK TABLET IN HALF FOR EASIER SWALLOWING  60 tablet  11  . mupirocin ointment (BACTROBAN) 2 %       .  omeprazole (PRILOSEC) 40 MG capsule Take 1 capsule (40 mg total) by mouth daily.  30 capsule  2  . PARoxetine (PAXIL) 40 MG tablet Take 1 tablet (40 mg total) by mouth daily.  30 tablet  6  . pravastatin (PRAVACHOL) 40 MG tablet TAKE ONE TABLET BY MOUTH ONCE DAILY  30 tablet  6  . traMADol (ULTRAM) 50 MG tablet Take 2 tablets (100 mg total) by mouth every 6 (six) hours as needed. Up to 4 times a day for pain  60 tablet  0  . zolpidem (AMBIEN) 5 MG tablet take 1 tablet by mouth at bedtime if needed for sleep  30 tablet  0  . zoster vaccine live, PF, (ZOSTAVAX) 29562 UNT/0.65ML injection Inject 19,400 Units into the skin once.  1 each  0   No current facility-administered medications on file prior to visit.    Allergies: Allergies  Allergen Reactions  . Morphine And Related     nauseated  . Pioglitazone     REACTION: Desquamation of skin of the palm  . Sulfonamide Derivatives     rash    FH: Family History    Problem Relation Age of Onset  . Colon cancer Neg Hx   . Diabetes Father   . Diabetes Brother     SH: History   Social History  . Marital Status: Divorced    Spouse Name: N/A    Number of Children: 0  . Years of Education: N/A   Occupational History  . Retired    Social History Main Topics  . Smoking status: Current Every Day Smoker -- 0.25 packs/day for 40 years    Types: Cigarettes  . Smokeless tobacco: Never Used  . Alcohol Use: 1.0 oz/week    2 drink(s) per week     Comment: wine  . Drug Use: No  . Sexual Activity: None   Other Topics Concern  . None   Social History Narrative   Financial assistance approved for 100% discount at [facility] and has [facility] card per [employer]   02/10/2010      3 caffeine drinks daily     Review of Systems: Constitutional: Negative for fever, chills and weight loss.  Eyes: Negative for blurred vision.  Respiratory: Negative for cough and shortness of breath.  Cardiovascular: Negative for chest pain, palpitations and leg swelling.  Gastrointestinal: Negative for nausea, vomiting, abdominal pain, diarrhea, constipation and blood in stool.  Genitourinary: Negative for dysuria, urgency and frequency.  Musculoskeletal: Negative for myalgias and back pain.  Neurological: Negative for dizziness, weakness and headaches.     Objective:   Vital Signs: Filed Vitals:   02/12/14 1555  BP: 143/80  Pulse: 87  Temp: 98.4 F (36.9 C)  TempSrc: Oral  Height: 5' 3.5" (1.613 m)  Weight: 200 lb 3.2 oz (90.81 kg)  SpO2: 100%      BP Readings from Last 3 Encounters:  02/12/14 143/80  12/18/13 136/91  11/18/13 150/84    Physical Exam: Constitutional: Vital signs reviewed.  Patient is well-developed and well-nourished in NAD and minimally cooperative with exam.  Head: Normocephalic and atraumatic. Eyes: PERRL, EOMI, conjunctivae nl, no scleral icterus.  Neck: Supple. Cardiovascular: RRR, no MRG. Pulmonary/Chest: normal effort,  non-tender to palpation, CTAB, no wheezes, rales, or rhonchi. Abdominal: Soft. NT/ND +BS. Neurological: A&O x3, cranial nerves II-XII are grossly intact, moving all extremities. Extremities: 2+DP b/l; no pitting edema. Skin: Warm, dry and intact. No rash.   Assessment & Plan:  Assessment and plan was discussed and formulated with my attending.

## 2014-02-15 ENCOUNTER — Other Ambulatory Visit: Payer: Self-pay | Admitting: Internal Medicine

## 2014-02-15 NOTE — Assessment & Plan Note (Addendum)
Pt comes in for a urgent visit stating that she has been experiencing nausea with some associated vertigo for one month.  She is a poor historian and is unable to provide many details regarding her symptoms.  She states she has vomiting a couple of times but it is mainly the nausea and dizziness that occurs in the morning right after she takes her medications.  She denies starting any new meds.  Denies any associated chest pain, SOB, diaphoresis, abdominal pain, weakness, unsteadiness, tinnitus or hearing loss.  Endorses a h/o migraines.  Reports that she feels better when she lies down.  On physical exam, there are no abnormal findings.  Difficult to ascertain what the etiology of er nausea and dizziness is.  Negative orthostatics.  Pt also has h/o barrett's esophagus and hiatal hernia.  Possible etiologies include: GERD, BPPV, migraine, gastroparesis. -trial of meclizine -continue to take PPI for GERD  -return to clinic if without improvement  -influenza vaccine today

## 2014-02-16 NOTE — Progress Notes (Signed)
Internal Medicine Clinic Attending  Case discussed with Dr. Gill soon after the resident saw the patient.  We reviewed the resident's history and exam and pertinent patient test results.  I agree with the assessment, diagnosis, and plan of care documented in the resident's note.  

## 2014-02-17 ENCOUNTER — Other Ambulatory Visit: Payer: Self-pay | Admitting: *Deleted

## 2014-02-17 DIAGNOSIS — E1149 Type 2 diabetes mellitus with other diabetic neurological complication: Secondary | ICD-10-CM

## 2014-02-17 MED ORDER — GLUCOSE BLOOD VI STRP: ORAL_STRIP | Status: DC

## 2014-02-17 MED ORDER — INSULIN ASPART PROT & ASPART (70-30 MIX) 100 UNIT/ML PEN: 10.0000 [IU] | PEN_INJECTOR | Freq: Two times a day (BID) | SUBCUTANEOUS | Status: DC

## 2014-02-18 ENCOUNTER — Ambulatory Visit: Payer: Medicare Other

## 2014-02-19 ENCOUNTER — Other Ambulatory Visit (INDEPENDENT_AMBULATORY_CARE_PROVIDER_SITE_OTHER): Payer: Medicare Other

## 2014-02-19 DIAGNOSIS — K219 Gastro-esophageal reflux disease without esophagitis: Secondary | ICD-10-CM

## 2014-02-19 LAB — CBC WITH DIFFERENTIAL/PLATELET
Basophils Absolute: 0 10*3/uL (ref 0.0–0.1)
Basophils Relative: 0 % (ref 0–1)
Eosinophils Absolute: 0.6 10*3/uL (ref 0.0–0.7)
Eosinophils Relative: 6 % — ABNORMAL HIGH (ref 0–5)
HEMATOCRIT: 29.5 % — AB (ref 36.0–46.0)
Hemoglobin: 9.7 g/dL — ABNORMAL LOW (ref 12.0–15.0)
LYMPHS PCT: 29 % (ref 12–46)
Lymphs Abs: 2.8 10*3/uL (ref 0.7–4.0)
MCH: 28.9 pg (ref 26.0–34.0)
MCHC: 32.9 g/dL (ref 30.0–36.0)
MCV: 87.8 fL (ref 78.0–100.0)
Monocytes Absolute: 0.4 10*3/uL (ref 0.1–1.0)
Monocytes Relative: 4 % (ref 3–12)
NEUTROS ABS: 5.8 10*3/uL (ref 1.7–7.7)
NEUTROS PCT: 61 % (ref 43–77)
Platelets: 289 10*3/uL (ref 150–400)
RBC: 3.36 MIL/uL — ABNORMAL LOW (ref 3.87–5.11)
RDW: 15.1 % (ref 11.5–15.5)
WBC: 9.5 10*3/uL (ref 4.0–10.5)

## 2014-02-19 LAB — COMPLETE METABOLIC PANEL WITH GFR
ALK PHOS: 88 U/L (ref 39–117)
ALT: 15 U/L (ref 0–35)
AST: 16 U/L (ref 0–37)
Albumin: 3.7 g/dL (ref 3.5–5.2)
BUN: 20 mg/dL (ref 6–23)
CO2: 20 meq/L (ref 19–32)
Calcium: 9.3 mg/dL (ref 8.4–10.5)
Chloride: 109 mEq/L (ref 96–112)
Creat: 1.52 mg/dL — ABNORMAL HIGH (ref 0.50–1.10)
GFR, EST AFRICAN AMERICAN: 41 mL/min — AB
GFR, EST NON AFRICAN AMERICAN: 35 mL/min — AB
GLUCOSE: 207 mg/dL — AB (ref 70–99)
Potassium: 4.8 mEq/L (ref 3.5–5.3)
SODIUM: 142 meq/L (ref 135–145)
TOTAL PROTEIN: 6.3 g/dL (ref 6.0–8.3)
Total Bilirubin: 0.3 mg/dL (ref 0.2–1.2)

## 2014-03-13 ENCOUNTER — Other Ambulatory Visit: Payer: Self-pay | Admitting: Internal Medicine

## 2014-03-16 ENCOUNTER — Other Ambulatory Visit: Payer: Self-pay | Admitting: Internal Medicine

## 2014-03-27 ENCOUNTER — Other Ambulatory Visit: Payer: Self-pay | Admitting: *Deleted

## 2014-04-09 ENCOUNTER — Ambulatory Visit (INDEPENDENT_AMBULATORY_CARE_PROVIDER_SITE_OTHER): Payer: Medicare Other | Admitting: Internal Medicine

## 2014-04-09 ENCOUNTER — Encounter: Payer: Self-pay | Admitting: Internal Medicine

## 2014-04-09 ENCOUNTER — Encounter: Payer: Medicare Other | Admitting: Internal Medicine

## 2014-04-09 VITALS — BP 183/94 | HR 94 | Temp 98.1°F | Ht 63.5 in | Wt 208.9 lb

## 2014-04-09 DIAGNOSIS — I10 Essential (primary) hypertension: Secondary | ICD-10-CM

## 2014-04-09 DIAGNOSIS — L309 Dermatitis, unspecified: Secondary | ICD-10-CM

## 2014-04-09 DIAGNOSIS — E1149 Type 2 diabetes mellitus with other diabetic neurological complication: Secondary | ICD-10-CM

## 2014-04-09 LAB — POCT GLYCOSYLATED HEMOGLOBIN (HGB A1C): Hemoglobin A1C: 7.3

## 2014-04-09 LAB — GLUCOSE, CAPILLARY: Glucose-Capillary: 145 mg/dL — ABNORMAL HIGH (ref 70–99)

## 2014-04-09 MED ORDER — HYDROCHLOROTHIAZIDE 25 MG PO TABS: ORAL_TABLET | ORAL | Status: DC

## 2014-04-09 NOTE — Progress Notes (Signed)
Subjective:    Patient ID: [name], female    DOB: 1946/01/19, 68 y.o.   MRN: [id number]  HPI Ms. Raxter is a 68 yo woman with HTN, DMII, CKD, hyperlipidemia, Barrett's esophagus and depression and is here for medication refills and evaluation of new headaches over the past 2 months. Her headaches are worst first thing in the morning, though they occasionally occur in the evening. They do not wake her from sleep. The pain gets worst with movement of her head. She cannot specify where the pain is worst, as it varies, but she describes the pain as dull and a 5/10 in intensity. She denies any associate blurred vision, chest pain or shortness of breath. She has had headaches since childhood, but these feel different. She finds that taking aleve causes the pain to subside most of the time. Occasionally, she has to escalate treatment to tramadol, which always relieves the pain. Of note, she has not been reliably taking her blood pressure medications. If she does take them, she takes them in the morning (lisinopril 40 mg daily and amlodipine 5 mg daily). Also of note, she has prescriptions for both melatonin and ambien.  In the clinic today, her BP was 178/89.     Medication List       This list is accurate as of: 04/09/14  4:10 PM.  Always use your most recent med list.               ACCU-CHEK FASTCLIX LANCETS Misc  1 Device by Does not apply route 4 (four) times daily -  before meals and at bedtime. Dx: 250.00     ALPRAZolam 0.5 MG tablet  Commonly known as:  XANAX  TAKE ONE TABLET BY MOUTH TWICE DAILY AS NEEDED FOR ANXIETY     amLODipine 5 MG tablet  Commonly known as:  NORVASC  TAKE ONE TABLET BY MOUTH ONCE DAILY     aspirin EC 81 MG tablet  Take 1 tablet (81 mg total) by mouth daily.     clobetasol cream 0.05 %  Commonly known as:  TEMOVATE     ferrous sulfate 325 (65 FE) MG tablet  Take 1 tablet (325 mg total) by mouth 2 (two) times daily.     FLUoxetine 40 MG capsule   Commonly known as:  PROZAC  as needed.     glucose blood test strip  Commonly known as:  ACCU-CHEK SMARTVIEW  Use to check blood sugar 3 to 4 times daily. diag code 250.60. Insulin dependent     hydrochlorothiazide 25 MG tablet  Commonly known as:  HYDRODIURIL  TAKE ONE TABLET BY MOUTH ONCE DAILY     Insulin Aspart Prot & Aspart (70-30) 100 UNIT/ML Pen  Commonly known as:  NOVOLOG 70/30 MIX  Inject 10 Units into the skin 2 (two) times daily.     INSULIN SYRINGE .5CC/31GX5/16" 31G X 5/16" 0.5 ML Misc  1 each by Does not apply route 2 (two) times daily before a meal.     latanoprost 0.005 % ophthalmic solution  Commonly known as:  XALATAN  Place 1 drop into both eyes daily.     lisinopril 40 MG tablet  Commonly known as:  PRINIVIL,ZESTRIL  take 1 tablet by mouth once daily     loratadine 10 MG tablet  Commonly known as:  CLARITIN  Take 10 mg by mouth as needed.     meclizine 12.5 MG tablet  Commonly known as:  ANTIVERT  Take  1 tablet (12.5 mg total) by mouth 3 (three) times daily as needed for dizziness or nausea.     Melatonin 1 MG Tabs  Take 1 tablet (1 mg total) by mouth at bedtime.     metFORMIN 1000 MG tablet  Commonly known as:  GLUCOPHAGE  TAKE 1 TABLET BY MOUTH TWICED AILY WITH MEALS. MAY BREAK TABLET IN HALF FOR EASIER SWALLOWING     mupirocin ointment 2 %  Commonly known as:  BACTROBAN     omeprazole 40 MG capsule  Commonly known as:  PRILOSEC  take 1 capsule by mouth once daily     PARoxetine 40 MG tablet  Commonly known as:  PAXIL  Take 1 tablet (40 mg total) by mouth daily.     Pen Needles 3/16" 31G X 5 MM Misc  Use twice daily to inject insulin. diag code 250.60. Insulin dependent     pravastatin 40 MG tablet  Commonly known as:  PRAVACHOL  TAKE ONE TABLET BY MOUTH ONCE DAILY     traMADol 50 MG tablet  Commonly known as:  ULTRAM  Take 2 tablets (100 mg total) by mouth every 6 (six) hours as needed. Up to 4 times a day for pain     zolpidem  5 MG tablet  Commonly known as:  AMBIEN  take 1 tablet by mouth at bedtime if needed for sleep     zoster vaccine live (PF) 19400 UNT/0.65ML injection  Commonly known as:  ZOSTAVAX  Inject 19,400 Units into the skin once.       Review of Systems  Constitutional: Negative for activity change and appetite change.  HENT: Negative for nosebleeds.   Eyes: Negative for visual disturbance.  Cardiovascular: Negative for chest pain and palpitations.  Gastrointestinal: Negative for vomiting.  Neurological: Positive for headaches. Negative for dizziness, weakness and numbness.  Psychiatric/Behavioral: Negative for suicidal ideas.       Objective:   Physical Exam  Constitutional: She is oriented to person, place, and time. She appears well-developed and well-nourished.  HENT:  Head: Normocephalic and atraumatic.  Eyes: Conjunctivae and EOM are normal. Pupils are equal, round, and reactive to light.  Cardiovascular: Normal rate, regular rhythm and normal heart sounds.   No murmur heard. Abdominal: Soft. Bowel sounds are normal.  Musculoskeletal: She exhibits no edema.  Neurological: She is alert and oriented to person, place, and time.  Skin: Skin is warm and dry.  Psychiatric: She has a normal mood and affect.        Assessment & Plan:   Ms. Dietsch is here for medication refills and morning headaches that do not wake her from sleep. These have occurred in the context of not taking her anti-hypertensives and are typically relieved fully with aleve. Of note, she was hypertensive to 178/89 in clinic today. It is likely that better control of her blood pressure would also control her headaches. Furthermore, she has prescriptions for melatonin and ambien, though she has only been taking the [place]. Melatonin can cause headache and has even been found to contribute to hypertension; Lorrin Mais can cause hypertension. Together, the effects may be even more pronounced.   - Counseling about  importance of blood pressure control with current anti-hypertensive regimen was discussed - D/c melatonin - F/u for BP check in 3 weeks - If headaches persist at that time, will consider further headache workup

## 2014-04-09 NOTE — Progress Notes (Signed)
Internal Medicine Clinic Attending  I saw and evaluated the patient.  I personally confirmed the key portions of the history and exam documented by Dr. Sherrine Maples and I reviewed pertinent patient test results.  The assessment, diagnosis, and plan were formulated together and I agree with the documentation in the resident's note.

## 2014-04-09 NOTE — Patient Instructions (Addendum)
Please take your lisinopril and amlodipine for blood pressure control. Your blood pressure was 183/94 today, which is much too high! Our goal for you is 140/90. Lowering your blood pressure will help to control your morning headaches.  Please come for a return visit in 3 weeks where we will check your blood pressure.  General Instructions:   Thank you for bringing your medicines today. This helps [place] keep you safe from mistakes.   Progress Toward Treatment Goals:  Treatment Goal 08/05/2013  Hemoglobin A1C at goal  Blood pressure unable to assess  Stop smoking smoking the same amount    Self Care Goals & Plans:  Self Care Goal 04/09/2014  Manage my medications take my medicines as prescribed; bring my medications to every visit  Monitor my health keep track of my blood glucose; bring my glucose meter and log to each visit; keep track of my blood pressure; bring my blood pressure log to each visit; keep track of my weight; check my feet daily  Eat healthy foods eat more vegetables; eat foods that are low in salt; eat baked foods instead of fried foods  Be physically active find an activity I enjoy  Stop smoking set a quit date and stop smoking  Prevent falls wear appropriate shoes  Meeting treatment goals -    Home Blood Glucose Monitoring 08/05/2013  Check my blood sugar 3 times a day  When to check my blood sugar before meals     Care Management & Community Referrals:  Referral 08/05/2013  Referrals made for care management support none needed  Referrals made to community resources none

## 2014-04-15 ENCOUNTER — Other Ambulatory Visit: Payer: Self-pay | Admitting: Internal Medicine

## 2014-04-20 NOTE — Telephone Encounter (Signed)
Called to pharm 

## 2014-04-29 ENCOUNTER — Ambulatory Visit (INDEPENDENT_AMBULATORY_CARE_PROVIDER_SITE_OTHER): Payer: Medicare Other | Admitting: Internal Medicine

## 2014-04-29 ENCOUNTER — Encounter: Payer: Self-pay | Admitting: Internal Medicine

## 2014-04-29 ENCOUNTER — Ambulatory Visit: Payer: Medicare Other | Admitting: Internal Medicine

## 2014-04-29 VITALS — BP 147/90 | HR 95 | Temp 97.6°F | Ht 63.5 in | Wt 202.4 lb

## 2014-04-29 DIAGNOSIS — I10 Essential (primary) hypertension: Secondary | ICD-10-CM | POA: Diagnosis not present

## 2014-04-29 DIAGNOSIS — E1149 Type 2 diabetes mellitus with other diabetic neurological complication: Secondary | ICD-10-CM

## 2014-04-29 DIAGNOSIS — L299 Pruritus, unspecified: Secondary | ICD-10-CM | POA: Diagnosis not present

## 2014-04-29 LAB — GLUCOSE, CAPILLARY: Glucose-Capillary: 261 mg/dL — ABNORMAL HIGH (ref 70–99)

## 2014-04-29 MED ORDER — LISINOPRIL-HYDROCHLOROTHIAZIDE 20-12.5 MG PO TABS: 2.0000 | ORAL_TABLET | Freq: Every day | ORAL | Status: DC

## 2014-04-29 NOTE — Assessment & Plan Note (Signed)
Blood pressure was better controlled today after medication compliance was emphasized on last appointment. Patient is also no longer experiencing headaches in the morning prior to taking her blood pressure medications (which suggests that her higher blood pressures previously may have been contributing to her headaches). It appears that she has been taking her lisinopril and HCTZ, but not her amlodipine.  - will initiate HCTZ/lisinopril combination pill to reduce pill burden - emphasize continuation of amlodipine - recheck BP in 3 weeks - emphasize continued d/c of melatonin

## 2014-04-29 NOTE — Patient Instructions (Addendum)
Your blood pressure control was better today (147/90), but your blood pressure is still higher than our goal.  Please be sure to take your amlodipine medication and your new hydrochlorothiazide / lisinopril combined pill for blood pressure control.  You can stop taking the individual hydrochlorothiazide pill.  You can stop taking the individual lisinopril pill.  Please DO NOT take melatonin.  General Instructions:   Thank you for bringing your medicines today. This helps [place] keep you safe from mistakes.   Progress Toward Treatment Goals:  Treatment Goal 08/05/2013  Hemoglobin A1C at goal  Blood pressure unable to assess  Stop smoking smoking the same amount    Self Care Goals & Plans:  Self Care Goal 04/29/2014  Manage my medications take my medicines as prescribed; bring my medications to every visit; refill my medications on time  Monitor my health keep track of my blood glucose; bring my glucose meter and log to each visit; keep track of my blood pressure  Eat healthy foods eat more vegetables; eat foods that are low in salt; eat baked foods instead of fried foods  Be physically active find an activity I enjoy  Stop smoking set a quit date and stop smoking  Prevent falls wear appropriate shoes  Meeting treatment goals -    Home Blood Glucose Monitoring 08/05/2013  Check my blood sugar 3 times a day  When to check my blood sugar before meals     Care Management & Community Referrals:  Referral 08/05/2013  Referrals made for care management support none needed  Referrals made to community resources none

## 2014-04-29 NOTE — Progress Notes (Signed)
Subjective:     Patient ID: [name], female   DOB: 02-20-1946, 69 y.o.   MRN: [id number]  HPI [name] is a 69 yo woman with HTN, DMII, CKD, hyperlipidemia, Barrett's esophagus and depression and is here for a blood pressure check. On her last visit (04/09/14), she had been experiencing morning headaches that occurred prior to her taking her morning blood pressure medications. At that time, counseling on the importance of blood pressure medication adherence and the discontinuation of melatonin were initiated (as melatonin can cause headache and contribute to headache).  Today, she reports that she has been taking her home lisinopril and HCTZ; her home BP monitor (wrist) has shown a variety of BPs over the past 3 weeks, but she believes the readings have mostly been in the 140s. Her headaches have subsided completely.  In the clinic today, her BP was 147/90.  Review of Systems  Constitutional: Negative for activity change and appetite change.  HENT: Negative for congestion.   Eyes: Negative for pain and visual disturbance.  Respiratory: Negative for cough and shortness of breath.   Cardiovascular: Negative for chest pain and palpitations.  Gastrointestinal: Negative.   Genitourinary: Negative.   Neurological: Negative for dizziness, weakness, light-headedness, numbness and headaches.  Psychiatric/Behavioral:       Some depression, but no SI/HI       Objective:   Physical Exam  Constitutional: She is oriented to person, place, and time. She appears well-developed and well-nourished.  HENT:  Head: Normocephalic and atraumatic.  Eyes: Conjunctivae and EOM are normal. Pupils are equal, round, and reactive to light.  Neck: Normal range of motion. Neck supple.  Cardiovascular: Normal rate, regular rhythm and normal heart sounds.   Pulmonary/Chest: Effort normal and breath sounds normal. She has no wheezes.  Abdominal: Soft. Bowel sounds are normal. She exhibits no distension. There  is no tenderness.  Musculoskeletal: She exhibits no edema or tenderness.  Left back lipoma  Neurological: She is alert and oriented to person, place, and time.  Skin: Skin is warm and dry. No erythema.  Lesions on right arm extensor (at elbow); patient follows with dermatology and currently applies clobetasol cream to this area   Psychiatric:  Occasional childish affect, but normal mood      Please see problem-oriented charting for assessment and plan.

## 2014-05-01 NOTE — Progress Notes (Signed)
Internal Medicine Clinic Attending  Case discussed with Dr. Mallory at the time of the visit.  We reviewed the resident's history and exam and pertinent patient test results.  I agree with the assessment, diagnosis, and plan of care documented in the resident's note. 

## 2014-05-06 DIAGNOSIS — L299 Pruritus, unspecified: Secondary | ICD-10-CM | POA: Diagnosis not present

## 2014-05-08 DIAGNOSIS — Z961 Presence of intraocular lens: Secondary | ICD-10-CM | POA: Diagnosis not present

## 2014-05-08 DIAGNOSIS — H4011X1 Primary open-angle glaucoma, mild stage: Secondary | ICD-10-CM | POA: Diagnosis not present

## 2014-05-08 DIAGNOSIS — E11329 Type 2 diabetes mellitus with mild nonproliferative diabetic retinopathy without macular edema: Secondary | ICD-10-CM | POA: Diagnosis not present

## 2014-05-08 DIAGNOSIS — E119 Type 2 diabetes mellitus without complications: Secondary | ICD-10-CM | POA: Diagnosis not present

## 2014-05-08 LAB — HM DIABETES EYE EXAM

## 2014-05-13 ENCOUNTER — Encounter: Payer: Self-pay | Admitting: *Deleted

## 2014-05-14 ENCOUNTER — Ambulatory Visit (INDEPENDENT_AMBULATORY_CARE_PROVIDER_SITE_OTHER): Payer: Medicare Other | Admitting: Internal Medicine

## 2014-05-14 ENCOUNTER — Encounter: Payer: Self-pay | Admitting: Internal Medicine

## 2014-05-14 VITALS — BP 147/77 | HR 102 | Temp 97.9°F | Ht 63.5 in | Wt 202.3 lb

## 2014-05-14 DIAGNOSIS — D500 Iron deficiency anemia secondary to blood loss (chronic): Secondary | ICD-10-CM | POA: Diagnosis not present

## 2014-05-14 DIAGNOSIS — I10 Essential (primary) hypertension: Secondary | ICD-10-CM | POA: Diagnosis not present

## 2014-05-14 DIAGNOSIS — E119 Type 2 diabetes mellitus without complications: Secondary | ICD-10-CM

## 2014-05-14 LAB — GLUCOSE, CAPILLARY: GLUCOSE-CAPILLARY: 74 mg/dL (ref 70–99)

## 2014-05-14 NOTE — Patient Instructions (Signed)
Take your medications as prescribed.  Follow up in 1 month.  General Instructions:   Please bring your medicines with you each time you come to clinic.  Medicines may include prescription medications, over-the-counter medications, herbal remedies, eye drops, vitamins, or other pills.   Progress Toward Treatment Goals:  Treatment Goal 08/05/2013  Hemoglobin A1C at goal  Blood pressure unable to assess  Stop smoking smoking the same amount    Self Care Goals & Plans:  Self Care Goal 04/29/2014  Manage my medications take my medicines as prescribed; bring my medications to every visit; refill my medications on time  Monitor my health keep track of my blood glucose; bring my glucose meter and log to each visit; keep track of my blood pressure  Eat healthy foods eat more vegetables; eat foods that are low in salt; eat baked foods instead of fried foods  Be physically active find an activity I enjoy  Stop smoking set a quit date and stop smoking  Prevent falls wear appropriate shoes  Meeting treatment goals -    Home Blood Glucose Monitoring 08/05/2013  Check my blood sugar 3 times a day  When to check my blood sugar before meals     Care Management & Community Referrals:  Referral 08/05/2013  Referrals made for care management support none needed  Referrals made to community resources none

## 2014-05-14 NOTE — Assessment & Plan Note (Signed)
Patient admitted that she has not been taking her iron supplements due to upset stomach. Her HR was 102 today. She has a history of anemia likely 2/2 GI blood loss. However, she reports that she has not noticed blood in her stool or any darkening of her stool. Also has not felt palpitations, shortness of breath or fatigue. She has a history of PICA, but this has resolved. Workup was started in 08/2013 when CBC showed a hgb 8.7; needs to be continued. RDW was 17.5, MCV/MCH/MCHC all at lower limits of normal. Ferritin low at 40. Endoscopy in 2012 revealed hemorrhagic gastritis and Barrett's esophagus without displasia. Colonoscopy 2012 also showed diverticulitis and polyps.  - CBC and iron studies on next appointment - Emphasized importance of taking ferrous sulfate 325 mg daily pills; could consider iron infusion if patient continues to have difficulty with the iron supplements or could increase dose if she is tolerating - EKG was suggested today, but patient needed to leave and would not stay for the test - EKG on next visit to help determine cause of tachycardia; likely due to her anemia - Colonoscopy in 5-10 years

## 2014-05-14 NOTE — Progress Notes (Signed)
   Subjective:    Patient ID: BO WADDINGTON, female    DOB: [birth year], 69 y.o.   MRN: [id number]  HPI Ms. Aulakh is a 69 yo woman with HTN, DMII, CKD, hyperlipidemia, Barrett's esophagus and depression and is here for a blood pressure check after her medications were recently manipulated. In December, Ms. Finamore had headaches that were accompanied by very high blood pressures in the context of some missed doses of her anti-hypertensive medications; her melatonin was also discontinued. On her last visit (04/29/2014), her blood pressure was very significantly decreased to 147/90. Along with this decrease in BP, her headaches had subsided. At her last appointment, her BP medications were changed to reduce her pill burden, so this appointment was to make sure that her pressures were still well controlled.   Her BP was 147/77 today and her headaches have not recurred. However, her pulse was elevated at 102. Ms. Steckel shared that she has not been taking her iron supplements as prescribed because they maker he nauseous.   Review of Systems  Constitutional: Negative for diaphoresis, activity change, appetite change and fatigue.  HENT: Negative.   Eyes: Negative.   Respiratory: Negative.  Negative for shortness of breath.   Cardiovascular: Negative.  Negative for chest pain.  Gastrointestinal: Negative.   Genitourinary: Negative.   Musculoskeletal: Negative.   Neurological: Negative.  Negative for dizziness, weakness, numbness and headaches.  Psychiatric/Behavioral: Positive for sleep disturbance. Negative for suicidal ideas.       Occasionally feels sad, but does not feel depressed       Objective:   Physical Exam  Constitutional: She appears well-developed and well-nourished. No distress.  HENT:  Head: Normocephalic and atraumatic.  Eyes: Conjunctivae and EOM are normal. Pupils are equal, round, and reactive to light.  Neck: Normal range of motion.  Cardiovascular: Normal rate, regular  rhythm and normal heart sounds.   No murmur heard. Pulmonary/Chest: Effort normal and breath sounds normal.  Skin: She is not diaphoretic.          Assessment & Plan:  Please see problem oriented charting for assessment & plan  Venita Lick, MD

## 2014-05-14 NOTE — Progress Notes (Signed)
Internal Medicine Clinic Attending  Case discussed with Dr. Mallory at the time of the visit.  We reviewed the resident's history and exam and pertinent patient test results.  I agree with the assessment, diagnosis, and plan of care documented in the resident's note. 

## 2014-05-14 NOTE — Assessment & Plan Note (Signed)
BP Readings from Last 3 Encounters:  05/14/14 147/77  04/29/14 147/90  04/09/14 183/94    Lab Results  Component Value Date   NA 142 02/19/2014   K 4.8 02/19/2014   CREATININE 1.52* 02/19/2014    Assessment: Blood pressure control: at goal    Comments: BP again at better control today with medication compliance (and with reduced pill burden). No more headaches prior to her morning medications.   Plan: Medications:  continue current medications HCTZ/lisinopril combination pill, amlodipine with d/c of melatonin Other plans: recheck BP in 3 weeks

## 2014-05-25 DIAGNOSIS — L299 Pruritus, unspecified: Secondary | ICD-10-CM | POA: Diagnosis not present

## 2014-06-03 DIAGNOSIS — L400 Psoriasis vulgaris: Secondary | ICD-10-CM | POA: Diagnosis not present

## 2014-06-05 DIAGNOSIS — L281 Prurigo nodularis: Secondary | ICD-10-CM | POA: Diagnosis not present

## 2014-06-15 DIAGNOSIS — L400 Psoriasis vulgaris: Secondary | ICD-10-CM | POA: Diagnosis not present

## 2014-06-16 ENCOUNTER — Telehealth: Payer: Self-pay | Admitting: *Deleted

## 2014-06-16 NOTE — Telephone Encounter (Signed)
Received PA request from pt's pharmacy for her Novolog mix 70/30 flexpen.  Medication is non-preferred.  Preferred medication is Humalog mix 75/25 kwikpen Will send to pcp for review and med change if appropriate. Pt was contacted and made aware of possible change.  Please advise.Deborah Hidden Cassady2/23/20164:24 PM      [facility] Medicare Part D ID: [id number] GRP:COS [id number]

## 2014-06-22 DIAGNOSIS — L400 Psoriasis vulgaris: Secondary | ICD-10-CM | POA: Diagnosis not present

## 2014-06-29 DIAGNOSIS — L400 Psoriasis vulgaris: Secondary | ICD-10-CM | POA: Diagnosis not present

## 2014-06-30 ENCOUNTER — Other Ambulatory Visit: Payer: Self-pay | Admitting: Internal Medicine

## 2014-07-01 ENCOUNTER — Other Ambulatory Visit: Payer: Self-pay | Admitting: *Deleted

## 2014-07-01 DIAGNOSIS — E1149 Type 2 diabetes mellitus with other diabetic neurological complication: Secondary | ICD-10-CM

## 2014-07-02 MED ORDER — INSULIN ASPART PROT & ASPART (70-30 MIX) 100 UNIT/ML PEN: 10.0000 [IU] | PEN_INJECTOR | Freq: Two times a day (BID) | SUBCUTANEOUS | Status: DC

## 2014-07-07 ENCOUNTER — Other Ambulatory Visit: Payer: Self-pay | Admitting: *Deleted

## 2014-07-07 DIAGNOSIS — L400 Psoriasis vulgaris: Secondary | ICD-10-CM | POA: Diagnosis not present

## 2014-07-09 ENCOUNTER — Ambulatory Visit (INDEPENDENT_AMBULATORY_CARE_PROVIDER_SITE_OTHER): Payer: Medicare Other | Admitting: Internal Medicine

## 2014-07-09 ENCOUNTER — Encounter: Payer: Self-pay | Admitting: Internal Medicine

## 2014-07-09 VITALS — BP 138/70 | HR 96 | Temp 98.3°F | Ht 63.5 in | Wt 199.3 lb

## 2014-07-09 DIAGNOSIS — I10 Essential (primary) hypertension: Secondary | ICD-10-CM

## 2014-07-09 DIAGNOSIS — E1149 Type 2 diabetes mellitus with other diabetic neurological complication: Secondary | ICD-10-CM | POA: Diagnosis not present

## 2014-07-09 DIAGNOSIS — D500 Iron deficiency anemia secondary to blood loss (chronic): Secondary | ICD-10-CM

## 2014-07-09 DIAGNOSIS — G4700 Insomnia, unspecified: Secondary | ICD-10-CM | POA: Diagnosis not present

## 2014-07-09 LAB — GLUCOSE, CAPILLARY: GLUCOSE-CAPILLARY: 92 mg/dL (ref 70–99)

## 2014-07-09 LAB — POCT GLYCOSYLATED HEMOGLOBIN (HGB A1C): Hemoglobin A1C: 7.4

## 2014-07-09 MED ORDER — ALPRAZOLAM 0.5 MG PO TABS: 0.5000 mg | ORAL_TABLET | Freq: Two times a day (BID) | ORAL | Status: DC | PRN

## 2014-07-09 MED ORDER — TRAMADOL HCL 50 MG PO TABS: 100.0000 mg | ORAL_TABLET | Freq: Four times a day (QID) | ORAL | Status: DC | PRN

## 2014-07-09 MED ORDER — FERROUS GLUCONATE 324 (38 FE) MG PO TABS: 324.0000 mg | ORAL_TABLET | Freq: Three times a day (TID) | ORAL | Status: DC

## 2014-07-09 MED ORDER — ZOLPIDEM TARTRATE 5 MG PO TABS: 5.0000 mg | ORAL_TABLET | Freq: Every evening | ORAL | Status: DC | PRN

## 2014-07-09 NOTE — Telephone Encounter (Signed)
Called to pharm 

## 2014-07-09 NOTE — Progress Notes (Signed)
   Subjective:    Patient ID: [name], female    DOB: [birth year], 69 y.o.   MRN: [id number]  HPI  [name] is a 69 yo woman with HTN, DMII (A1c 7.4% today), CKD Stage III, hyperlipidemia, Barrett's esophagus and depression who is here for medication refills and diabetes check. Her most recent visits have been for hypertension and headaches that were thought to be related. Her elevated blood pressure was likely the result of some skipped doses of anti-hypertensive medications. She has resumed reliably taking her medications. Her BP today was 138/79; her headaches have resolved. Her pulse was also found to be high on her last visit (102). Today, it remains on the higher end of normal (94). She shared that she has not been taking her iron supplements because of their taste and the fact that they upset her stomach. She maintains the same today. She is occasionally fatigued. She denies dizziness, light-headedness or shortness of breath.   Review of Systems  Constitutional: Negative for activity change and appetite change.  HENT: Negative.   Eyes: Negative.   Respiratory: Negative.  Negative for shortness of breath.   Cardiovascular: Negative for chest pain and palpitations.  Gastrointestinal: Negative for abdominal pain.       Nausea with her iron supplements  Genitourinary: Negative.   Musculoskeletal: Negative.   Skin: Negative.   Neurological: Negative.   Psychiatric/Behavioral:       Occasionally gets "fed up" with her dad. Finds that her xanax helps.       Objective:   Physical Exam  Constitutional: She is oriented to person, place, and time. She appears well-developed and well-nourished.  HENT:  Head: Normocephalic and atraumatic.  Eyes: Conjunctivae and EOM are normal. Pupils are equal, round, and reactive to light.  Neck: Normal range of motion. Neck supple.  Cardiovascular: Normal rate, regular rhythm and normal heart sounds.   No murmur heard. Pulmonary/Chest:  Effort normal and breath sounds normal. She has no wheezes.  Abdominal: Soft. Bowel sounds are normal. She exhibits no distension. There is no tenderness.  Musculoskeletal: Normal range of motion. She exhibits no edema.  Neurological: She is alert and oriented to person, place, and time.  Skin: Skin is warm and dry.  Psychiatric: Her behavior is normal.          Assessment & Plan:   Please see problem oriented charting for assessment & plan  Deborah Lick, MD

## 2014-07-09 NOTE — Assessment & Plan Note (Signed)
Patient admitted that she still is not taking her iron supplements due to nausea when she takes them (and not liking the taste). Her HR was 94 today. She has a history of anemia. She is not experiencing shortness of breath, palpitations or dizziness; she is fatigued from time to time. Last workup 08/2013 showed Hgb 8.7, RDW 17.5, MCV/MCH/MCHC all at lower limit of normal. Ferritin 40. Endoscopy in 2012 showed hemorrhagic gastritis and Barrett's esophagus without dysplasia. Colonoscopy 2012 showed diverticulitis and polyps.  - Patient has agreed to try ferrous gluconate (sometimes better tolerated); 324 mg PO q8 hours (with each meal) was prescribed - F/u blood work in 3 months to determine response. If patient is unable to tolerate these supplements, can consider iron infusion if labs necessitate (CBC and iron studies) - Next colonoscopy due in 5-10 years

## 2014-07-09 NOTE — Assessment & Plan Note (Signed)
Lab Results  Component Value Date   HGBA1C 7.4 07/09/2014   HGBA1C 7.3 04/09/2014   HGBA1C 6.4 12/18/2013     Assessment: Diabetes control: stable   Progress toward A1C goal: slightly worse today    Comments: Patient has had no hypoglycemic episodes or symptoms.  Plan: Medications:  continue current medications aspart 10 U BID and metformin 1000 BID Instruction/counseling given: reminded to bring blood glucose meter & log to each visit Educational resources provided: Disucssed importance of weight loss in working toward A1c goal. Patient to attempt 3-5 pound weight loss through diet choices and exercise   Self management tools provided: yes   Other plans: Recheck A1c in 3 months.

## 2014-07-09 NOTE — Patient Instructions (Signed)
Continue your current diabetes regimen; work to make good diet choices. If you can lose 3-5 pounds, your diabetes, blood pressure and other health problems will improve!  Take the iron supplements; we will check your blood levels at your next appointment.  General Instructions:   Thank you for bringing your medicines today. This helps [place] keep you safe from mistakes.   Progress Toward Treatment Goals:  Treatment Goal 08/05/2013  Hemoglobin A1C at goal  Blood pressure unable to assess  Stop smoking smoking the same amount    Self Care Goals & Plans:  Self Care Goal 07/09/2014  Manage my medications take my medicines as prescribed; bring my medications to every visit; refill my medications on time  Monitor my health keep track of my blood glucose; bring my glucose meter and log to each visit; keep track of my blood pressure; check my feet daily  Eat healthy foods eat more vegetables; eat baked foods instead of fried foods  Be physically active take a walk every day  Stop smoking set a quit date and stop smoking  Prevent falls wear appropriate shoes  Meeting treatment goals -    Home Blood Glucose Monitoring 08/05/2013  Check my blood sugar 3 times a day  When to check my blood sugar before meals     Care Management & Community Referrals:  Referral 08/05/2013  Referrals made for care management support none needed  Referrals made to community resources none

## 2014-07-09 NOTE — Assessment & Plan Note (Addendum)
BP Readings from Last 3 Encounters:  07/09/14 138/70  05/14/14 147/77  04/29/14 147/90    Lab Results  Component Value Date   NA 142 02/19/2014   K 4.8 02/19/2014   CREATININE 1.52* 02/19/2014    Assessment: Blood pressure control:improved    Progress toward BP goal:  At goal   Comments: Patient reports better compliance with anti-hypertensives  Plan: Medications:  continue current medications (amlodipine 5 mg by mouth daily and lisinopril 40 mg by mouth daily)   Self management tools provided:   Other plans: Discussed importance of weight loss toward blood pressure goal today. Patient no longer having headaches (subsided with better BP control).

## 2014-07-09 NOTE — Progress Notes (Signed)
Case discussed with Dr. Mallory at time of visit. We reviewed the resident's history and exam and pertinent patient test results. I agree with the assessment, diagnosis, and plan of care documented in the resident's note. 

## 2014-07-12 ENCOUNTER — Other Ambulatory Visit: Payer: Self-pay | Admitting: Internal Medicine

## 2014-07-15 DIAGNOSIS — L400 Psoriasis vulgaris: Secondary | ICD-10-CM | POA: Diagnosis not present

## 2014-08-13 DIAGNOSIS — L309 Dermatitis, unspecified: Secondary | ICD-10-CM | POA: Diagnosis not present

## 2014-08-13 DIAGNOSIS — L281 Prurigo nodularis: Secondary | ICD-10-CM | POA: Diagnosis not present

## 2014-08-14 ENCOUNTER — Telehealth: Payer: Self-pay | Admitting: *Deleted

## 2014-08-14 NOTE — Telephone Encounter (Signed)
Call from pt - stated her insurance will not pay for her Insulin. I called [facility] pharmacy - stated prior authorization is needed; will fax another request. I will give PA to [facility].

## 2014-08-14 NOTE — Telephone Encounter (Signed)
Copied from a previous note dated 06/16/2014:     Received PA request from pt's pharmacy for her Novolog mix 70/30 flexpen. Medication is non-preferred. Preferred medication is Humalog mix 75/25 kwikpen Will send to pcp for review and med change if appropriate. Pt was contacted and made aware of possible change.     [facility] Medicare Part D ID: [id number] GRP:COS [phone]

## 2014-08-17 MED ORDER — INSULIN LISPRO PROT & LISPRO (75-25 MIX) 100 UNIT/ML KWIKPEN: 10.0000 [IU] | PEN_INJECTOR | Freq: Two times a day (BID) | SUBCUTANEOUS | Status: DC

## 2014-08-19 NOTE — Telephone Encounter (Signed)
Call from pt-still having difficulty obtaing insulin. Rx went to old pharmacy, pt now uses [facility] on [address].  Contacted both pharmacies-also called in pen needles.  Spoke with pt-she will contact me directly if she continues to have difficulty obtaining rx.  Phone call complete.Deborah Hidden Cassady4/27/20163:06 PM

## 2014-08-24 DIAGNOSIS — L400 Psoriasis vulgaris: Secondary | ICD-10-CM | POA: Diagnosis not present

## 2014-09-07 ENCOUNTER — Ambulatory Visit (INDEPENDENT_AMBULATORY_CARE_PROVIDER_SITE_OTHER): Payer: Commercial Managed Care - HMO | Admitting: Nurse Practitioner

## 2014-09-07 ENCOUNTER — Encounter: Payer: Self-pay | Admitting: Nurse Practitioner

## 2014-09-07 VITALS — BP 142/90 | HR 81 | Temp 98.1°F | Resp 18 | Ht 64.08 in | Wt 195.8 lb

## 2014-09-07 DIAGNOSIS — E785 Hyperlipidemia, unspecified: Secondary | ICD-10-CM | POA: Diagnosis not present

## 2014-09-07 DIAGNOSIS — Z1239 Encounter for other screening for malignant neoplasm of breast: Secondary | ICD-10-CM

## 2014-09-07 DIAGNOSIS — F419 Anxiety disorder, unspecified: Secondary | ICD-10-CM | POA: Diagnosis not present

## 2014-09-07 DIAGNOSIS — D509 Iron deficiency anemia, unspecified: Secondary | ICD-10-CM

## 2014-09-07 DIAGNOSIS — E1122 Type 2 diabetes mellitus with diabetic chronic kidney disease: Secondary | ICD-10-CM | POA: Diagnosis not present

## 2014-09-07 DIAGNOSIS — I10 Essential (primary) hypertension: Secondary | ICD-10-CM | POA: Diagnosis not present

## 2014-09-07 DIAGNOSIS — N189 Chronic kidney disease, unspecified: Secondary | ICD-10-CM | POA: Diagnosis not present

## 2014-09-07 DIAGNOSIS — L309 Dermatitis, unspecified: Secondary | ICD-10-CM

## 2014-09-07 DIAGNOSIS — K2270 Barrett's esophagus without dysplasia: Secondary | ICD-10-CM | POA: Diagnosis not present

## 2014-09-07 DIAGNOSIS — M1990 Unspecified osteoarthritis, unspecified site: Secondary | ICD-10-CM | POA: Diagnosis not present

## 2014-09-07 NOTE — Progress Notes (Signed)
Patient ID: [name], female   DOB: 1946/02/17, 69 y.o.   MRN: [id number]    PCP: [name], NP  Allergies  Allergen Reactions  . Morphine And Related     nauseated  . Pioglitazone     REACTION: Desquamation of skin of the palm  . Sulfonamide Derivatives     rash    Chief Complaint  Patient presents with  . Establish Care    New patient establish care, referral to dermatologist.   . Medication Management    Request for larger needle for Insulin   . Orders    Mammogram, Previously completed at [facility]     HPI: Patient is a 69 y.o. female seen in the office today to establish care. Has changed insurance and therefore had to change PCP. Previously has been seen by internal medicine at [facility].   Follows with dermatologist for eczema-- needs new referral   Sees ophthalmology- Dr [name] Fitch- upcoming appt in July Needs mammogram- she thinks she went to [facility] last.  Checks blood sugars but does not have a log- lost meter.   No children, no spouse, lives alone  Advanced Directive information Does patient have an advance directive?: No, Does patient want to make changes to advanced directive?: Yes - information given Review of Systems:  Review of Systems  Constitutional: Negative for fever, activity change, appetite change and fatigue.  HENT: Negative for congestion, rhinorrhea and sinus pressure.   Respiratory: Negative for shortness of breath.   Cardiovascular: Negative for chest pain and leg swelling.  Gastrointestinal: Positive for constipation (MOM every other day). Negative for abdominal distention.  Genitourinary: Negative for difficulty urinating.  Musculoskeletal: Positive for arthralgias (knees and back pain).  Allergic/Immunologic: Negative for environmental allergies.  Neurological: Negative for dizziness and headaches.  Psychiatric/Behavioral: The patient is nervous/anxious.     Past Medical History  Diagnosis Date  . Diabetes  mellitus   . Asthma     childhood  . Depression   . Anxiety   . Hypertension   . Hyperlipidemia   . 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   Marland Kitchen Hx MRSA infection 2009  . Dysphagia   . Personal history of colonic polyps 10/17/2010    hyperplastic  . Barrett's esophagus   . Neuromuscular disorder     chronic pain  . Arthritis     lower back and knees   . Eczema    Past Surgical History  Procedure Laterality Date  . Abdominal hysterectomy      unclear when  . Thigh surg      left side due to MRSA  . Cataract extraction Bilateral    Social History:   reports that she has been smoking Cigarettes.  She has a 10 pack-year smoking history. She has never used smokeless tobacco. She reports that she drinks about 1.2 oz of alcohol per week. She reports that she does not use illicit drugs.  Family History  Problem Relation Age of Onset  . Colon cancer Neg Hx   . Hypertension Father   . Diabetes Brother     Medications: Patient's Medications  New Prescriptions   No medications on file  Previous Medications   ACCU-CHEK FASTCLIX LANCETS MISC    1 Device by Does not apply route 4 (four) times daily -  before meals and at bedtime. Dx: 250.00   ALPRAZOLAM (XANAX) 0.5 MG TABLET  Take 1 tablet (0.5 mg total) by mouth 2 (two) times daily as needed. for anxiety   AMLODIPINE (NORVASC) 5 MG TABLET    take 1 tablet by mouth once daily   ASPIRIN EC 81 MG TABLET    Take 1 tablet (81 mg total) by mouth daily.   CLOBETASOL CREAM (TEMOVATE) 0.05 %    Apply 1 application topically at bedtime.    FERROUS SULFATE 325 (65 FE) MG TABLET    Take 325 mg by mouth 2 (two) times daily with a meal.   GLUCOSE BLOOD (ACCU-CHEK SMARTVIEW) TEST STRIP    Use to check blood sugar 3 to 4 times daily. diag code 250.60. Insulin dependent   HYDROXYZINE (ATARAX/VISTARIL) 10 MG TABLET    Take 10 mg by mouth every 6 (six) hours as needed for  itching.   INSULIN LISPRO PROT & LISPRO (HUMALOG MIX 75/25 KWIKPEN) (75-25) 100 UNIT/ML KWIKPEN    Inject 10 Units into the skin 2 (two) times daily.   INSULIN PEN NEEDLE (PEN NEEDLES 3/16") 31G X 5 MM MISC    Use twice daily to inject insulin. diag code 250.60. Insulin dependent   INSULIN SYRINGE-NEEDLE U-100 (INSULIN SYRINGE .5CC/31GX5/16") 31G X 5/16" 0.5 ML MISC    1 each by Does not apply route 2 (two) times daily before a meal.   LATANOPROST (XALATAN) 0.005 % OPHTHALMIC SOLUTION    Place 1 drop into both eyes daily.    LISINOPRIL (PRINIVIL,ZESTRIL) 40 MG TABLET    take 1 tablet by mouth once daily   LORATADINE (CLARITIN) 10 MG TABLET    Take 10 mg by mouth as needed.   METFORMIN (GLUCOPHAGE) 1000 MG TABLET    TAKE 1 TABLET BY MOUTH TWICED AILY WITH MEALS. MAY BREAK TABLET IN HALF FOR EASIER SWALLOWING   OMEPRAZOLE (PRILOSEC) 40 MG CAPSULE    take 1 capsule by mouth once daily   PAROXETINE (PAXIL) 40 MG TABLET    Take 1 tablet (40 mg total) by mouth daily.   PRAVASTATIN (PRAVACHOL) 40 MG TABLET    TAKE ONE TABLET BY MOUTH ONCE DAILY   TRAMADOL (ULTRAM) 50 MG TABLET    Take 2 tablets (100 mg total) by mouth every 6 (six) hours as needed. Up to 4 times a day for pain this is a 30 day supply   ZOLPIDEM (AMBIEN) 5 MG TABLET    Take 1 tablet (5 mg total) by mouth at bedtime as needed. for sleep  Modified Medications   No medications on file  Discontinued Medications   FERROUS GLUCONATE (FERGON) 324 MG TABLET    Take 1 tablet (324 mg total) by mouth 3 (three) times daily with meals.   LISINOPRIL-HYDROCHLOROTHIAZIDE (PRINZIDE,ZESTORETIC) 20-12.5 MG PER TABLET    Take 2 tablets by mouth daily.   MECLIZINE (ANTIVERT) 12.5 MG TABLET    TAKE ONE TABLET BY MOUTH THREE TIMES DAILY AS NEEDED FOR DIZZINESS OR NAUSEA   ZOSTER VACCINE LIVE, PF, (ZOSTAVAX) 10272 UNT/0.65ML INJECTION    Inject 19,400 Units into the skin once.     Physical Exam:  Filed Vitals:   09/07/14 1321  BP: 142/90  Pulse: 81    Temp: 98.1 F (36.7 C)  TempSrc: Oral  Resp: 18  Height: 5' 4.07" (1.628 m)  Weight: 195 lb 12.8 oz (88.814 kg)  SpO2: 97%    Physical Exam  Constitutional: She is oriented to person, place, and time. She appears well-developed and well-nourished. No distress.  HENT:  Head: Normocephalic and  atraumatic.  Mouth/Throat: Oropharynx is clear and moist. No oropharyngeal exudate.  Eyes: Conjunctivae are normal. Pupils are equal, round, and reactive to light.  Neck: Normal range of motion. Neck supple.  Cardiovascular: Normal rate, regular rhythm and normal heart sounds.   Pulmonary/Chest: Effort normal and breath sounds normal.  Abdominal: Soft. Bowel sounds are normal.  Musculoskeletal: She exhibits no edema or tenderness.  Neurological: She is alert and oriented to person, place, and time.  Skin: Skin is warm and dry. She is not diaphoretic.  Psychiatric: She has a normal mood and affect.    Labs reviewed: Basic Metabolic Panel:  Recent Labs  02/19/14 1340  NA 142  K 4.8  CL 109  CO2 20  GLUCOSE 207*  BUN 20  CREATININE 1.52*  CALCIUM 9.3   Liver Function Tests:  Recent Labs  02/19/14 1340  AST 16  ALT 15  ALKPHOS 88  BILITOT 0.3  PROT 6.3  ALBUMIN 3.7   No results for input(s): LIPASE, AMYLASE in the last 8760 hours. No results for input(s): AMMONIA in the last 8760 hours. CBC:  Recent Labs  02/19/14 1340  WBC 9.5  NEUTROABS 5.8  HGB 9.7*  HCT 29.5*  MCV 87.8  PLT 289   Lipid Panel: No results for input(s): CHOL, HDL, LDLCALC, TRIG, CHOLHDL, LDLDIRECT in the last 8760 hours. TSH: No results for input(s): TSH in the last 8760 hours. A1C: Lab Results  Component Value Date   HGBA1C 7.4 07/09/2014     Assessment/Plan  1. Barrett esophagus conts on omeprazole, no symptoms of GERD  2. Essential hypertension Slightly above goal today, will monitor this and make changes if needed  3. Hyperlipidemia -hx of but not currently on medications,  will follow up labs prior to next visit  - Lipid panel; Future  4. Chronic kidney disease, unspecified stage -follow up blood work  - Advertising account executive; Future  5. Eczema - Ambulatory referral to Dermatology  6. Arthritis -reports arthritis due to aging, no acute or worsening pains. Takes ultram PRN however does not have bottles today so no refill provided  7. Anxiety -controlled on paxil   8. Anemia, iron deficiency -conts on iron supplements - CBC with Differential/Platelet; Future  9. Type 2 diabetes mellitus with diabetic chronic kidney disease Pt has lost blood glucose meter sent to pharmacy, will have pt take blood sugars and bring to follow up. - Ambulatory referral to Ophthalmology - Hemoglobin A1c; Future  10. Breast cancer screening - MM DIGITAL SCREENING BILATERAL; Future  Follow up in 1 month for EV with fasting blood work prior to visit, will need MMSE

## 2014-09-07 NOTE — Patient Instructions (Signed)
Follow up in 1 month for physical with fasting blood work prior to visit Record blood sugars and bring log to next visit Twice daily--- Fasting and before bed

## 2014-09-08 ENCOUNTER — Ambulatory Visit: Payer: Medicare Other | Admitting: Internal Medicine

## 2014-09-08 ENCOUNTER — Other Ambulatory Visit: Payer: Self-pay

## 2014-09-08 MED ORDER — ONETOUCH ULTRA SYSTEM W/DEVICE KIT: PACK | Status: DC

## 2014-09-08 MED ORDER — ONETOUCH DELICA LANCETS FINE MISC: Status: DC

## 2014-09-08 MED ORDER — GLUCOSE BLOOD VI STRP: ORAL_STRIP | Status: DC

## 2014-09-11 ENCOUNTER — Other Ambulatory Visit: Payer: Self-pay | Admitting: Internal Medicine

## 2014-09-11 DIAGNOSIS — Z1231 Encounter for screening mammogram for malignant neoplasm of breast: Secondary | ICD-10-CM | POA: Diagnosis not present

## 2014-09-11 LAB — HM MAMMOGRAPHY

## 2014-09-14 ENCOUNTER — Encounter: Payer: Self-pay | Admitting: *Deleted

## 2014-09-26 ENCOUNTER — Other Ambulatory Visit: Payer: Self-pay | Admitting: Internal Medicine

## 2014-09-28 DIAGNOSIS — L400 Psoriasis vulgaris: Secondary | ICD-10-CM | POA: Diagnosis not present

## 2014-10-06 ENCOUNTER — Telehealth: Payer: Self-pay | Admitting: *Deleted

## 2014-10-06 NOTE — Telephone Encounter (Signed)
Patient called and left message on voicemail stating that she was about to run out of test strips to test her blood sugar.  I reviewed patient chart and test strips was called in on 09/08/14 by Chrae for #100 with 11 refills. Called and left message on patient's voicemail with this information.

## 2014-10-08 ENCOUNTER — Other Ambulatory Visit: Payer: Commercial Managed Care - HMO

## 2014-10-08 DIAGNOSIS — N189 Chronic kidney disease, unspecified: Secondary | ICD-10-CM

## 2014-10-08 DIAGNOSIS — D509 Iron deficiency anemia, unspecified: Secondary | ICD-10-CM | POA: Diagnosis not present

## 2014-10-08 DIAGNOSIS — E785 Hyperlipidemia, unspecified: Secondary | ICD-10-CM

## 2014-10-09 LAB — CBC WITH DIFFERENTIAL/PLATELET
BASOS ABS: 0 10*3/uL (ref 0.0–0.2)
BASOS: 0 %
EOS (ABSOLUTE): 0.5 10*3/uL — ABNORMAL HIGH (ref 0.0–0.4)
Eos: 8 %
HEMATOCRIT: 34.4 % (ref 34.0–46.6)
HEMOGLOBIN: 11.3 g/dL (ref 11.1–15.9)
Immature Grans (Abs): 0 10*3/uL (ref 0.0–0.1)
Immature Granulocytes: 0 %
LYMPHS: 33 %
Lymphocytes Absolute: 2.3 10*3/uL (ref 0.7–3.1)
MCH: 28.3 pg (ref 26.6–33.0)
MCHC: 32.8 g/dL (ref 31.5–35.7)
MCV: 86 fL (ref 79–97)
MONOCYTES: 6 %
Monocytes Absolute: 0.4 10*3/uL (ref 0.1–0.9)
Neutrophils Absolute: 3.6 10*3/uL (ref 1.4–7.0)
Neutrophils: 53 %
Platelets: 251 10*3/uL (ref 150–379)
RBC: 3.99 x10E6/uL (ref 3.77–5.28)
RDW: 16.3 % — ABNORMAL HIGH (ref 12.3–15.4)
WBC: 6.9 10*3/uL (ref 3.4–10.8)

## 2014-10-09 LAB — COMPREHENSIVE METABOLIC PANEL
ALK PHOS: 145 IU/L — AB (ref 39–117)
ALT: 37 IU/L — ABNORMAL HIGH (ref 0–32)
AST: 25 IU/L (ref 0–40)
Albumin/Globulin Ratio: 1.4 (ref 1.1–2.5)
Albumin: 3.8 g/dL (ref 3.6–4.8)
BUN / CREAT RATIO: 17 (ref 11–26)
BUN: 24 mg/dL (ref 8–27)
Bilirubin Total: 0.3 mg/dL (ref 0.0–1.2)
CHLORIDE: 107 mmol/L (ref 97–108)
CO2: 20 mmol/L (ref 18–29)
Calcium: 9.4 mg/dL (ref 8.7–10.3)
Creatinine, Ser: 1.39 mg/dL — ABNORMAL HIGH (ref 0.57–1.00)
GFR calc Af Amer: 45 mL/min/{1.73_m2} — ABNORMAL LOW (ref >59)
GFR calc non Af Amer: 39 mL/min/{1.73_m2} — ABNORMAL LOW (ref >59)
Globulin, Total: 2.8 g/dL (ref 1.5–4.5)
Glucose: 106 mg/dL — ABNORMAL HIGH (ref 65–99)
Potassium: 4.6 mmol/L (ref 3.5–5.2)
Sodium: 141 mmol/L (ref 134–144)
TOTAL PROTEIN: 6.6 g/dL (ref 6.0–8.5)

## 2014-10-09 LAB — LIPID PANEL
CHOL/HDL RATIO: 3.9 ratio (ref 0.0–4.4)
Cholesterol, Total: 204 mg/dL — ABNORMAL HIGH (ref 100–199)
HDL: 52 mg/dL (ref >39)
LDL Calculated: 118 mg/dL — ABNORMAL HIGH (ref 0–99)
Triglycerides: 170 mg/dL — ABNORMAL HIGH (ref 0–149)
VLDL Cholesterol Cal: 34 mg/dL (ref 5–40)

## 2014-10-09 LAB — HEMOGLOBIN A1C
Est. average glucose Bld gHb Est-mCnc: 137 mg/dL
Hgb A1c MFr Bld: 6.4 % — ABNORMAL HIGH (ref 4.8–5.6)

## 2014-10-13 ENCOUNTER — Encounter: Payer: Self-pay | Admitting: Nurse Practitioner

## 2014-10-13 ENCOUNTER — Ambulatory Visit (INDEPENDENT_AMBULATORY_CARE_PROVIDER_SITE_OTHER): Payer: Commercial Managed Care - HMO | Admitting: Nurse Practitioner

## 2014-10-13 VITALS — BP 126/82 | HR 89 | Temp 97.6°F | Resp 20 | Ht 64.0 in | Wt 192.8 lb

## 2014-10-13 DIAGNOSIS — G4700 Insomnia, unspecified: Secondary | ICD-10-CM

## 2014-10-13 DIAGNOSIS — L309 Dermatitis, unspecified: Secondary | ICD-10-CM | POA: Diagnosis not present

## 2014-10-13 DIAGNOSIS — G8929 Other chronic pain: Secondary | ICD-10-CM | POA: Diagnosis not present

## 2014-10-13 DIAGNOSIS — I10 Essential (primary) hypertension: Secondary | ICD-10-CM | POA: Diagnosis not present

## 2014-10-13 DIAGNOSIS — E785 Hyperlipidemia, unspecified: Secondary | ICD-10-CM

## 2014-10-13 DIAGNOSIS — E1149 Type 2 diabetes mellitus with other diabetic neurological complication: Secondary | ICD-10-CM | POA: Diagnosis not present

## 2014-10-13 DIAGNOSIS — M25561 Pain in right knee: Secondary | ICD-10-CM

## 2014-10-13 DIAGNOSIS — M25569 Pain in unspecified knee: Secondary | ICD-10-CM

## 2014-10-13 MED ORDER — PRAVASTATIN SODIUM 40 MG PO TABS: ORAL_TABLET | ORAL | Status: DC

## 2014-10-13 MED ORDER — AMLODIPINE BESYLATE 5 MG PO TABS: 5.0000 mg | ORAL_TABLET | Freq: Every day | ORAL | Status: DC

## 2014-10-13 MED ORDER — GLUCOSE BLOOD VI STRP: ORAL_STRIP | Status: DC

## 2014-10-13 MED ORDER — OMEPRAZOLE 40 MG PO CPDR: 40.0000 mg | DELAYED_RELEASE_CAPSULE | Freq: Every day | ORAL | Status: DC

## 2014-10-13 MED ORDER — ZOLPIDEM TARTRATE 5 MG PO TABS
5.0000 mg | ORAL_TABLET | Freq: Every evening | ORAL | Status: DC | PRN
Start: 2014-10-13 — End: 2015-01-15

## 2014-10-13 MED ORDER — LISINOPRIL 40 MG PO TABS: 40.0000 mg | ORAL_TABLET | Freq: Every day | ORAL | Status: DC

## 2014-10-13 NOTE — Patient Instructions (Addendum)
Follow up in 3 months with Dr Eulas Post   Take your cholesterol medication, sent to pharmacy and refills provided   Cardiac Diet This diet can help prevent heart disease and stroke. Many factors influence your heart health, including eating and exercise habits. Coronary risk rises a lot with abnormal blood fat (lipid) levels. Cardiac meal planning includes limiting unhealthy fats, increasing healthy fats, and making other small dietary changes. General guidelines are as follows:  Adjust calorie intake to reach and maintain desirable body weight.  Limit total fat intake to less than 30% of total calories. Saturated fat should be less than 7% of calories.  Saturated fats are found in animal products and in some vegetable products. Saturated vegetable fats are found in coconut oil, cocoa butter, palm oil, and palm kernel oil. Read labels carefully to avoid these products as much as possible. Use butter in moderation. Choose tub margarines and oils that have 2 grams of fat or less. Good cooking oils are canola and olive oils.  Practice low-fat cooking techniques. Do not fry food. Instead, broil, bake, boil, steam, grill, roast on a rack, stir-fry, or microwave it. Other fat reducing suggestions include:  Remove the skin from poultry.  Remove all visible fat from meats.  Skim the fat off stews, soups, and gravies before serving them.  Steam vegetables in water or broth instead of sauting them in fat.  Avoid foods with trans fat (or hydrogenated oils), such as commercially fried foods and commercially baked goods. Commercial shortening and deep-frying fats will contain trans fat.  Increase intake of fruits, vegetables, whole grains, and legumes to replace foods high in fat.  Increase consumption of nuts, legumes, and seeds to at least 4 servings weekly. One serving of a legume equals  cup, and 1 serving of nuts or seeds equals  cup.  Choose whole grains more often. Have 3 servings per day (a  serving is 1 ounce [oz]).  Eat 4 to 5 servings of vegetables per day. A serving of vegetables is 1 cup of raw leafy vegetables;  cup of raw or cooked cut-up vegetables;  cup of vegetable juice.  Eat 4 to 5 servings of fruit per day. A serving of fruit is 1 medium whole fruit;  cup of dried fruit;  cup of fresh, frozen, or canned fruit;  cup of 100% fruit juice.  Increase your intake of dietary fiber to 20 to 30 grams per day. Insoluble fiber may help lower your risk of heart disease and may help curb your appetite.  Soluble fiber binds cholesterol to be removed from the blood. Foods high in soluble fiber are dried beans, citrus fruits, oats, apples, bananas, broccoli, Brussels sprouts, and eggplant.  Try to include foods fortified with plant sterols or stanols, such as yogurt, breads, juices, or margarines. Choose several fortified foods to achieve a daily intake of 2 to 3 grams of plant sterols or stanols.  Foods with omega-3 fats can help reduce your risk of heart disease. Aim to have a 3.5 oz portion of fatty fish twice per week, such as salmon, mackerel, albacore tuna, sardines, lake trout, or herring. If you wish to take a fish oil supplement, choose one that contains 1 gram of both DHA and EPA.  Limit processed meats to 2 servings (3 oz portion) weekly.  Limit the sodium in your diet to 1500 milligrams (mg) per day. If you have high blood pressure, talk to a registered dietitian about a DASH (Dietary Approaches to Stop Hypertension)  eating plan.  Limit sweets and beverages with added sugar, such as soda, to no more than 5 servings per week. One serving is:   1 tablespoon sugar.  1 tablespoon jelly or jam.   cup sorbet.  1 cup lemonade.   cup regular soda. CHOOSING FOODS Starches  Allowed: Breads: All kinds (wheat, rye, raisin, white, oatmeal, [place], [place], and English muffin bread). Low-fat rolls: English muffins, frankfurter and hamburger buns, bagels, pita bread,  tortillas (not fried). Pancakes, waffles, biscuits, and muffins made with recommended oil.  Avoid: Products made with saturated or trans fats, oils, or whole milk products. Butter rolls, cheese breads, croissants. Commercial doughnuts, muffins, sweet rolls, biscuits, waffles, pancakes, store-bought mixes. Crackers  Allowed: Low-fat crackers and snacks: Animal, graham, rye, saltine (with recommended oil, no lard), oyster, and matzo crackers. Bread sticks, melba toast, rusks, flatbread, pretzels, and light popcorn.  Avoid: High-fat crackers: cheese crackers, butter crackers, and those made with coconut, palm oil, or trans fat (hydrogenated oils). Buttered popcorn. Cereals  Allowed: Hot or cold whole-grain cereals.  Avoid: Cereals containing coconut, hydrogenated vegetable fat, or animal fat. Potatoes / Pasta / Rice  Allowed: All kinds of potatoes, rice, and pasta (such as macaroni, spaghetti, and noodles).  Avoid: Pasta or rice prepared with cream sauce or high-fat cheese. Chow mein noodles, [place] fries. Vegetables  Allowed: All vegetables and vegetable juices.  Avoid: Fried vegetables. Vegetables in cream, butter, or high-fat cheese sauces. Limit coconut. Fruit in cream or custard. Protein  Allowed: Limit your intake of meat, seafood, and poultry to no more than 6 oz (cooked weight) per day. All lean, well-trimmed beef, veal, pork, and lamb. All chicken and [place] without skin. All fish and shellfish. Wild game: wild duck, rabbit, pheasant, and venison. Egg whites or low-cholesterol egg substitutes may be used as desired. Meatless dishes: recipes with dried beans, peas, lentils, and tofu (soybean curd). Seeds and nuts: all seeds and most nuts.  Avoid: Prime grade and other heavily marbled and fatty meats, such as short ribs, spare ribs, rib eye roast or steak, frankfurters, sausage, bacon, and high-fat luncheon meats, mutton. Caviar. Commercially fried fish. Domestic duck, goose, venison  sausage. Organ meats: liver, gizzard, heart, chitterlings, brains, kidney, sweetbreads. Dairy  Allowed: Low-fat cheeses: nonfat or low-fat cottage cheese (1% or 2% fat), cheeses made with part skim milk, such as mozzarella, farmers, string, or ricotta. (Cheeses should be labeled no more than 2 to 6 grams fat per oz.). Skim (or 1%) milk: liquid, powdered, or evaporated. Buttermilk made with low-fat milk. Drinks made with skim or low-fat milk or cocoa. Chocolate milk or cocoa made with skim or low-fat (1%) milk. Nonfat or low-fat yogurt.  Avoid: Whole milk cheeses, including colby, cheddar, mu[place]ster, Monterey Jack, [place], [place], [place], American, Swiss, and blue. Creamed cottage cheese, cream cheese. Whole milk and whole milk products, including buttermilk or yogurt made from whole milk, drinks made from whole milk. Condensed milk, evaporated whole milk, and 2% milk. Soups and Combination Foods  Allowed: Low-fat low-sodium soups: broth, dehydrated soups, homemade broth, soups with the fat removed, homemade cream soups made with skim or low-fat milk. Low-fat spaghetti, lasagna, chili, and Spanish rice if low-fat ingredients and low-fat cooking techniques are used.  Avoid: Cream soups made with whole milk, cream, or high-fat cheese. All other soups. Desserts and Sweets  Allowed: Sherbet, fruit ices, gelatins, meringues, and angel food cake. Homemade desserts with recommended fats, oils, and milk products. Jam, jelly, honey, marmalade, sugars, and syrups. Pure  sugar candy, such as gum drops, hard candy, jelly beans, marshmallows, mints, and small amounts of dark chocolate.  Avoid: Commercially prepared cakes, pies, cookies, frosting, pudding, or mixes for these products. Desserts containing whole milk products, chocolate, coconut, lard, palm oil, or palm kernel oil. Ice cream or ice cream drinks. Candy that contains chocolate, coconut, butter, hydrogenated fat, or unknown ingredients. Buttered  syrups. Fats and Oils  Allowed: Vegetable oils: safflower, sunflower, corn, soybean, cottonseed, sesame, canola, olive, or peanut. Non-hydrogenated margarines. Salad dressing or mayonnaise: homemade or commercial, made with a recommended oil. Low or nonfat salad dressing or mayonnaise.  Limit added fats and oils to 6 to 8 tsp per day (includes fats used in cooking, baking, salads, and spreads on bread). Remember to count the "hidden fats" in foods.  Avoid: Solid fats and shortenings: butter, lard, salt pork, bacon drippings. Gravy containing meat fat, shortening, or suet. Cocoa butter, coconut. Coconut oil, palm oil, palm kernel oil, or hydrogenated oils: these ingredients are often used in bakery products, nondairy creamers, whipped toppings, candy, and commercially fried foods. Read labels carefully. Salad dressings made of unknown oils, sour cream, or cheese, such as blue cheese and Roquefort. Cream, all kinds: half-and-half, light, heavy, or whipping. Sour cream or cream cheese (even if "light" or low-fat). Nondairy cream substitutes: coffee creamers and sour cream substitutes made with palm, palm kernel, hydrogenated oils, or coconut oil. Beverages  Allowed: Coffee (regular or decaffeinated), tea. Diet carbonated beverages, mineral water. Alcohol: Check with your caregiver. Moderation is recommended.  Avoid: Whole milk, regular sodas, and juice drinks with added sugar. Condiments  Allowed: All seasonings and condiments. Cocoa powder. "Cream" sauces made with recommended ingredients.  Avoid: Carob powder made with hydrogenated fats. SAMPLE MENU Breakfast   cup orange juice   cup oatmeal  1 slice toast  1 tsp margarine  1 cup skim milk Lunch  [place] sandwich with 2 oz [place], 2 slices bread  Lettuce and tomato slices  Fresh fruit  Carrot sticks  Coffee or tea Snack  Fresh fruit or low-fat crackers Dinner  3 oz lean ground beef  1 baked potato  1 tsp  margarine   cup asparagus  Lettuce salad  1 tbs non-creamy dressing   cup peach slices  1 cup skim milk Document Released: 01/18/2008 Document Revised: 10/10/2011 Document Reviewed: 06/10/2013 Exit[employer] Patient Information 2015 [place], [place]. This information is not intended to replace advice given to you by your health care provider. Make sure you discuss any questions you have with your health care provider.

## 2014-10-13 NOTE — Progress Notes (Signed)
Patient ID: [name], female   DOB: 1946/01/24, 69 y.o.   MRN: [id number]    PCP: [name], NP  Allergies  Allergen Reactions  . Morphine And Related     nauseated  . Pioglitazone     REACTION: Desquamation of skin of the palm  . Sulfonamide Derivatives     rash    Chief Complaint  Patient presents with  . Annual Exam    Annual exam     HPI: Patient is a 69 y.o. female seen in the office today for preventative exam.   Screenings: Colon Cancer- done 2012 Breast Cancer- mammogram done 2016 Cervical Cancer- total hysterectomy  Osteoporosis- Dexa Scan done 2014  Vaccines Up to date on: influenza, pneumococcal, Tdap, Zostavax   Smoking status: current smoker, not willing to quit at this time, has cut back Alcohol use: occasional wine and beer   Dentist: needs to go, does not go currently, money and time is an issue Ophthalmologist: going next month  Exercise regimen: walking, every day 20 mins  Diet: does not follow diet   Specialist Follows with dermatologist for eczema-- needs new referral  ophthalmology- Dr [name] Fitch- upcoming appt in July Orthopedic- needs referral to Dr Marlou Sa   Still having problem getting test strips -- taking blood sugars multiple times a day  Blood sugars 99-160s fasting, 180s after meals  Not compliant with cholesterol medication- did not get prescriptions filled- did not have refills on the bottle, requested refill never heard back so she did not worry about it. Ran out a long time ago.      Advanced Directive information Does patient have an advance directive?: No, Does patient want to make changes to advanced directive?: Yes - information given Review of Systems:  Review of Systems  Constitutional: Negative for fever, activity change, appetite change and fatigue.  HENT: Negative for congestion, rhinorrhea and sinus pressure.   Respiratory: Negative for shortness of breath.   Cardiovascular: Negative for chest pain and  leg swelling.  Gastrointestinal: Positive for constipation (MOM every other day). Negative for abdominal distention.  Genitourinary: Negative for difficulty urinating.  Musculoskeletal: Positive for arthralgias (knees and back pain).  Allergic/Immunologic: Negative for environmental allergies.  Neurological: Negative for dizziness and headaches.  Psychiatric/Behavioral: The patient is nervous/anxious.     Past Medical History  Diagnosis Date  . Diabetes mellitus   . Asthma     childhood  . Depression   . Anxiety   . Hypertension   . Hyperlipidemia   . 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   Marland Kitchen Hx MRSA infection 2009  . Dysphagia   . Personal history of colonic polyps 10/17/2010    hyperplastic  . Barrett's esophagus   . Neuromuscular disorder     chronic pain  . Arthritis     lower back and knees   . Eczema    Past Surgical History  Procedure Laterality Date  . Abdominal hysterectomy      unclear when  . Thigh surg      left side due to MRSA  . Cataract extraction Bilateral    Social History:   reports that she has been smoking Cigarettes.  She has a 10 pack-year smoking history. She has never used smokeless tobacco. She reports that she drinks about 1.2 oz of alcohol per week. She reports that she does not use illicit drugs.  Family History  Problem  Relation Age of Onset  . Colon cancer Neg Hx   . Hypertension Father   . Diabetes Brother     Medications: Patient's Medications  New Prescriptions   No medications on file  Previous Medications   ALPRAZOLAM (XANAX) 0.5 MG TABLET    Take 1 tablet (0.5 mg total) by mouth 2 (two) times daily as needed. for anxiety   AMLODIPINE (NORVASC) 5 MG TABLET    take 1 tablet by mouth once daily   ASPIRIN EC 81 MG TABLET    Take 1 tablet (81 mg total) by mouth daily.   BLOOD GLUCOSE MONITORING SUPPL (ONE TOUCH ULTRA SYSTEM KIT) W/DEVICE KIT    Check blood  sugar twice daily DX E11.49   CLOBETASOL CREAM (TEMOVATE) 0.05 %    Apply 1 application topically at bedtime.    FERROUS SULFATE 325 (65 FE) MG TABLET    Take 325 mg by mouth 2 (two) times daily with a meal.   GLUCOSE BLOOD TEST STRIP    Check blood sugar twice daily DX E11.49   HYDROXYZINE (ATARAX/VISTARIL) 10 MG TABLET    Take 10 mg by mouth every 6 (six) hours as needed for itching.   INSULIN LISPRO PROT & LISPRO (HUMALOG MIX 75/25 KWIKPEN) (75-25) 100 UNIT/ML KWIKPEN    Inject 10 Units into the skin 2 (two) times daily.   INSULIN PEN NEEDLE (PEN NEEDLES 3/16") 31G X 5 MM MISC    Use twice daily to inject insulin. diag code 250.60. Insulin dependent   INSULIN SYRINGE-NEEDLE U-100 (INSULIN SYRINGE .5CC/31GX5/16") 31G X 5/16" 0.5 ML MISC    1 each by Does not apply route 2 (two) times daily before a meal.   LATANOPROST (XALATAN) 0.005 % OPHTHALMIC SOLUTION    Place 1 drop into both eyes daily.    LISINOPRIL (PRINIVIL,ZESTRIL) 40 MG TABLET    take 1 tablet by mouth once daily   LORATADINE (CLARITIN) 10 MG TABLET    Take 10 mg by mouth as needed.   METFORMIN (GLUCOPHAGE) 1000 MG TABLET    TAKE 1 TABLET BY MOUTH TWICED AILY WITH MEALS. MAY BREAK TABLET IN HALF FOR EASIER SWALLOWING   OMEPRAZOLE (PRILOSEC) 40 MG CAPSULE    take 1 capsule by mouth once daily   ONETOUCH DELICA LANCETS FINE MISC    Check blood sugar twice daily DX E11.49   PAROXETINE (PAXIL) 40 MG TABLET    Take 1 tablet (40 mg total) by mouth daily.   PRAVASTATIN (PRAVACHOL) 40 MG TABLET    TAKE ONE TABLET BY MOUTH ONCE DAILY   TRAMADOL (ULTRAM) 50 MG TABLET    Take 2 tablets (100 mg total) by mouth every 6 (six) hours as needed. Up to 4 times a day for pain this is a 30 day supply   ZOLPIDEM (AMBIEN) 5 MG TABLET    Take 1 tablet (5 mg total) by mouth at bedtime as needed. for sleep  Modified Medications   No medications on file  Discontinued Medications   No medications on file     Physical Exam:  Filed Vitals:   10/13/14  1132  BP: 126/82  Pulse: 89  Temp: 97.6 F (36.4 C)  TempSrc: Oral  Resp: 20  Height: 5' 4"  (1.626 m)  Weight: 192 lb 12.8 oz (87.454 kg)  SpO2: 98%    Physical Exam  Constitutional: She is oriented to person, place, and time. She appears well-developed and well-nourished. No distress.  HENT:  Head: Normocephalic and atraumatic.  Right Ear: External ear normal.  Left Ear: External ear normal.  Nose: Nose normal.  Mouth/Throat: Oropharynx is clear and moist. No oropharyngeal exudate.  Eyes: Conjunctivae are normal. Pupils are equal, round, and reactive to light.  Neck: Normal range of motion. Neck supple.  Cardiovascular: Normal rate, regular rhythm, normal heart sounds and intact distal pulses.   Pulmonary/Chest: Effort normal and breath sounds normal.  Abdominal: Soft. Bowel sounds are normal.  Musculoskeletal: Normal range of motion. She exhibits tenderness (to right knee). She exhibits no edema.  Neurological: She is alert and oriented to person, place, and time. She has normal reflexes. No cranial nerve deficit. Coordination normal.  Skin: Skin is warm and dry. She is not diaphoretic.  Psychiatric: She has a normal mood and affect.    Labs reviewed: Basic Metabolic Panel:  Recent Labs  02/19/14 1340 10/08/14 0826  NA 142 141  K 4.8 4.6  CL 109 107  CO2 20 20  GLUCOSE 207* 106*  BUN 20 24  CREATININE 1.52* 1.39*  CALCIUM 9.3 9.4   Liver Function Tests:  Recent Labs  02/19/14 1340 10/08/14 0826  AST 16 25  ALT 15 37*  ALKPHOS 88 145*  BILITOT 0.3 0.3  PROT 6.3 6.6  ALBUMIN 3.7  --    No results for input(s): LIPASE, AMYLASE in the last 8760 hours. No results for input(s): AMMONIA in the last 8760 hours. CBC:  Recent Labs  02/19/14 1340 10/08/14 0826  WBC 9.5 6.9  NEUTROABS 5.8 3.6  HGB 9.7*  --   HCT 29.5* 34.4  MCV 87.8  --   PLT 289  --    Lipid Panel:  Recent Labs  10/08/14 0826  CHOL 204*  HDL 52  LDLCALC 118*  TRIG 170*    CHOLHDL 3.9   TSH: No results for input(s): TSH in the last 8760 hours. A1C: Lab Results  Component Value Date   HGBA1C 6.4* 10/08/2014     Assessment/Plan  1. Hypertension, essential -discussed lifestyle modifications, cont current medications -lab work reviewed  - EKG 12-Lead obtained and reviewed with pt - lisinopril (PRINIVIL,ZESTRIL) 40 MG tablet; Take 1 tablet (40 mg total) by mouth daily.  Dispense: 30 tablet; Refill: 2 - amLODipine (NORVASC) 5 MG tablet; Take 1 tablet (5 mg total) by mouth daily.  Dispense: 30 tablet; Refill: 6  2. Insomnia -controlled on medication  - zolpidem (AMBIEN) 5 MG tablet; Take 1 tablet (5 mg total) by mouth at bedtime as needed. for sleep  Dispense: 30 tablet; Refill: 0  3. Hyperlipidemia -has not been taking medication because she was out.  -discussed medication compliance  - pravastatin (PRAVACHOL) 40 MG tablet; TAKE ONE TABLET BY MOUTH ONCE DAILY  Dispense: 30 tablet; Refill: 6  4. DM (diabetes mellitus) type II controlled, neurological manifestation - A1c reviewed and has improved -discussed dietary compliance -cont current medications - glucose blood test strip; Check blood sugar twice daily DX E11.49  Dispense: 100 each; Refill: 11  5. Eczema - Ambulatory referral to Dermatology- did not go last time, does not know if referral is still good  6. Chronic knee pain, right - has seen orthopedics in the past, would like referral  -Ambulatory referral to Orthopedics   7. Preventative care The patient was counseled regarding the appropriate use of alcohol, regular self-examination of the breasts on a monthly basis, prevention of dental and periodontal disease, diet, regular sustained exercise for at least 30 minutes 5 times per week, routine screening interval  for mammogram as recommended by the [place] and ACOG, importance of regular PAP smears, discussed smoking cessation and recommended schedule for GI hemoccult  testing, colonoscopy, cholesterol, thyroid and diabetes screening. -discussed lab work -pt up to date on colonoscopy, mammogram  -needs dental appt, to quit smoking pt aware of this  Follow up in 3 months with Dr Eulas Post

## 2014-11-04 DIAGNOSIS — M25561 Pain in right knee: Secondary | ICD-10-CM | POA: Diagnosis not present

## 2014-11-10 DIAGNOSIS — H4011X1 Primary open-angle glaucoma, mild stage: Secondary | ICD-10-CM | POA: Diagnosis not present

## 2014-11-10 DIAGNOSIS — Z961 Presence of intraocular lens: Secondary | ICD-10-CM | POA: Diagnosis not present

## 2014-11-17 ENCOUNTER — Other Ambulatory Visit: Payer: Self-pay | Admitting: Orthopedic Surgery

## 2014-11-17 DIAGNOSIS — M25561 Pain in right knee: Secondary | ICD-10-CM

## 2014-11-19 ENCOUNTER — Ambulatory Visit
Admission: RE | Admit: 2014-11-19 | Discharge: 2014-11-19 | Disposition: A | Discharge status: Home / Self Care | Payer: Commercial Managed Care - HMO | Source: Ambulatory Visit | Attending: Orthopedic Surgery | Admitting: Orthopedic Surgery

## 2014-11-19 DIAGNOSIS — M23321 Other meniscus derangements, posterior horn of medial meniscus, right knee: Secondary | ICD-10-CM | POA: Diagnosis not present

## 2014-11-19 DIAGNOSIS — M25461 Effusion, right knee: Secondary | ICD-10-CM | POA: Diagnosis not present

## 2014-11-19 DIAGNOSIS — M25561 Pain in right knee: Secondary | ICD-10-CM

## 2014-11-24 ENCOUNTER — Other Ambulatory Visit: Payer: Self-pay | Admitting: Internal Medicine

## 2014-12-04 DIAGNOSIS — M1711 Unilateral primary osteoarthritis, right knee: Secondary | ICD-10-CM | POA: Diagnosis not present

## 2014-12-07 ENCOUNTER — Other Ambulatory Visit: Payer: Self-pay | Admitting: *Deleted

## 2014-12-07 MED ORDER — METFORMIN HCL 1000 MG PO TABS: ORAL_TABLET | ORAL | Status: DC

## 2014-12-07 NOTE — Telephone Encounter (Signed)
Patient requested to be faxed to [facility]

## 2014-12-09 DIAGNOSIS — L723 Sebaceous cyst: Secondary | ICD-10-CM | POA: Diagnosis not present

## 2014-12-09 DIAGNOSIS — S80861A Insect bite (nonvenomous), right lower leg, initial encounter: Secondary | ICD-10-CM | POA: Diagnosis not present

## 2015-01-04 ENCOUNTER — Other Ambulatory Visit: Payer: Self-pay | Admitting: Internal Medicine

## 2015-01-05 ENCOUNTER — Other Ambulatory Visit: Payer: Self-pay | Admitting: *Deleted

## 2015-01-05 DIAGNOSIS — I10 Essential (primary) hypertension: Secondary | ICD-10-CM

## 2015-01-05 MED ORDER — LISINOPRIL 40 MG PO TABS: ORAL_TABLET | ORAL | Status: DC

## 2015-01-05 NOTE — Telephone Encounter (Signed)
[facility] 

## 2015-01-15 ENCOUNTER — Encounter: Payer: Self-pay | Admitting: Internal Medicine

## 2015-01-15 ENCOUNTER — Ambulatory Visit: Payer: Commercial Managed Care - HMO | Admitting: Internal Medicine

## 2015-01-15 ENCOUNTER — Ambulatory Visit (INDEPENDENT_AMBULATORY_CARE_PROVIDER_SITE_OTHER): Payer: Commercial Managed Care - HMO | Admitting: Internal Medicine

## 2015-01-15 VITALS — BP 138/80 | HR 107 | Temp 97.7°F | Resp 20 | Ht 64.0 in | Wt 194.4 lb

## 2015-01-15 DIAGNOSIS — N189 Chronic kidney disease, unspecified: Secondary | ICD-10-CM | POA: Diagnosis not present

## 2015-01-15 DIAGNOSIS — E1149 Type 2 diabetes mellitus with other diabetic neurological complication: Secondary | ICD-10-CM | POA: Diagnosis not present

## 2015-01-15 DIAGNOSIS — G8929 Other chronic pain: Secondary | ICD-10-CM | POA: Diagnosis not present

## 2015-01-15 DIAGNOSIS — M25561 Pain in right knee: Secondary | ICD-10-CM | POA: Diagnosis not present

## 2015-01-15 DIAGNOSIS — E785 Hyperlipidemia, unspecified: Secondary | ICD-10-CM | POA: Diagnosis not present

## 2015-01-15 DIAGNOSIS — I10 Essential (primary) hypertension: Secondary | ICD-10-CM | POA: Diagnosis not present

## 2015-01-15 DIAGNOSIS — Z23 Encounter for immunization: Secondary | ICD-10-CM

## 2015-01-15 DIAGNOSIS — F418 Other specified anxiety disorders: Secondary | ICD-10-CM | POA: Diagnosis not present

## 2015-01-15 DIAGNOSIS — G4700 Insomnia, unspecified: Secondary | ICD-10-CM | POA: Diagnosis not present

## 2015-01-15 MED ORDER — ZOLPIDEM TARTRATE 10 MG PO TABS: ORAL_TABLET | ORAL | Status: DC

## 2015-01-15 MED ORDER — ALPRAZOLAM 0.5 MG PO TABS: 0.5000 mg | ORAL_TABLET | Freq: Two times a day (BID) | ORAL | Status: DC | PRN

## 2015-01-15 NOTE — Patient Instructions (Signed)
Increase zolpidem to 10 mg at bedtime  Continue other medications as ordered  Will call with lab results  Follow up in 3 mos with [name] for routine visit

## 2015-01-15 NOTE — Progress Notes (Signed)
Patient ID: [name], female   DOB: 1945-10-04, 69 y.o.   MRN: [id number]    Location:    [facility]   Place of Service:  OFFICE   Chief Complaint  Patient presents with  . Medical Management of Chronic Issues    Refill on ambien and xanax    HPI:  69 yo female seen today for f/u. She reports increased stressors at home. Not sleeping well and is up several times at night with worry.   DM - BS stable at home; 89 today. No low BS reactions. She takes humalog and metformin  HTN - stable on amlodipine and lisinopril  Hyperlipidemia - no myalgias on statin  Depression/anxiety/insomnia - stable on paroxetine. She has increased worry and takes prn alprazolam TID. She is not sleeping well.  Eczema - stable on steroid cream and atarax  Pain stable on prn tramadol. Pain worse in right knee.   Past Medical History  Diagnosis Date  . Diabetes mellitus   . Asthma     childhood  . Depression   . Anxiety   . Hypertension   . Hyperlipidemia   . 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   Marland Kitchen Hx MRSA infection 2009  . Dysphagia   . Personal history of colonic polyps 10/17/2010    hyperplastic  . Barrett's esophagus   . Neuromuscular disorder     chronic pain  . Arthritis     lower back and knees   . Eczema     Past Surgical History  Procedure Laterality Date  . Abdominal hysterectomy      unclear when  . Thigh surg      left side due to MRSA  . Cataract extraction Bilateral     Patient Care Team: [name], NP as PCP - General (Nurse Practitioner) Woodroe Mode as Diabetes Educator [name], MD as Consulting Physician (Ophthalmology)  Social History   Social History  . Marital Status: Divorced    Spouse Name: N/A  . Number of Children: 0  . Years of Education: N/A   Occupational History  . Retired    Social History Main Topics  . Smoking status: Current Every Day Smoker -- 0.25  packs/day for 40 years    Types: Cigarettes  . Smokeless tobacco: Never Used     Comment: smokes 1 pack of cigerettes a week- off and on  . Alcohol Use: 1.2 oz/week    2 Standard drinks or equivalent per week     Comment: wine and beer- occasionally   . Drug Use: No  . Sexual Activity: Not on file   Other Topics Concern  . Not on file   Social History Narrative   Financial assistance approved for 100% discount at [facility] and has [facility] card per Bonna Gains   02/10/2010      3 caffeine drinks daily      reports that she has been smoking Cigarettes.  She has a 10 pack-year smoking history. She has never used smokeless tobacco. She reports that she drinks about 1.2 oz of alcohol per week. She reports that she does not use illicit drugs.  Allergies  Allergen Reactions  . Morphine And Related     nauseated  . Pioglitazone     REACTION: Desquamation of skin of the palm  . Sulfonamide Derivatives     rash    Medications: Patient's Medications  New  Prescriptions   No medications on file  Previous Medications   ALPRAZOLAM (XANAX) 0.5 MG TABLET    Take 1 tablet (0.5 mg total) by mouth 2 (two) times daily as needed. for anxiety   AMLODIPINE (NORVASC) 5 MG TABLET    Take 1 tablet (5 mg total) by mouth daily.   ASPIRIN EC 81 MG TABLET    Take 1 tablet (81 mg total) by mouth daily.   BLOOD GLUCOSE MONITORING SUPPL (ONE TOUCH ULTRA SYSTEM KIT) W/DEVICE KIT    Check blood sugar twice daily DX E11.49   CLOBETASOL CREAM (TEMOVATE) 0.05 %    Apply 1 application topically at bedtime.    FERROUS SULFATE 325 (65 FE) MG TABLET    Take 325 mg by mouth 2 (two) times daily with a meal.   GLUCOSE BLOOD TEST STRIP    Check blood sugar twice daily DX E11.49   HYDROXYZINE (ATARAX/VISTARIL) 10 MG TABLET    Take 10 mg by mouth every 6 (six) hours as needed for itching.   INSULIN LISPRO PROT & LISPRO (HUMALOG MIX 75/25 KWIKPEN) (75-25) 100 UNIT/ML KWIKPEN    Inject 10 Units into the skin 2 (two) times daily.    INSULIN PEN NEEDLE (PEN NEEDLES 3/16") 31G X 5 MM MISC    Use twice daily to inject insulin. diag code 250.60. Insulin dependent   INSULIN SYRINGE-NEEDLE U-100 (INSULIN SYRINGE .5CC/31GX5/16") 31G X 5/16" 0.5 ML MISC    1 each by Does not apply route 2 (two) times daily before a meal.   LATANOPROST (XALATAN) 0.005 % OPHTHALMIC SOLUTION    Place 1 drop into both eyes daily.    LISINOPRIL (PRINIVIL,ZESTRIL) 40 MG TABLET    Take one tablet by mouth once daily for blood pressure   LORATADINE (CLARITIN) 10 MG TABLET    Take 10 mg by mouth as needed.   METFORMIN (GLUCOPHAGE) 1000 MG TABLET    Take one tablet by mouth twice daily with meals to control blood sugar. May break tablet in half for easier swallowing.   OMEPRAZOLE (PRILOSEC) 40 MG CAPSULE    Take 1 capsule (40 mg total) by mouth daily.   ONETOUCH DELICA LANCETS FINE MISC    Check blood sugar twice daily DX E11.49   PAROXETINE (PAXIL) 40 MG TABLET    Take 1 tablet (40 mg total) by mouth daily.   PRAVASTATIN (PRAVACHOL) 40 MG TABLET    TAKE ONE TABLET BY MOUTH ONCE DAILY   TRAMADOL (ULTRAM) 50 MG TABLET    Take 2 tablets (100 mg total) by mouth every 6 (six) hours as needed. Up to 4 times a day for pain this is a 30 day supply   ZOLPIDEM (AMBIEN) 5 MG TABLET    Take 1 tablet (5 mg total) by mouth at bedtime as needed. for sleep  Modified Medications   No medications on file  Discontinued Medications   No medications on file    Review of Systems  Unable to perform ROS: Psychiatric disorder    Filed Vitals:   01/15/15 1424  BP: 138/80  Pulse: 107  Temp: 97.7 F (36.5 C)  TempSrc: Oral  Resp: 20  Height: 5' 4"  (1.626 m)  Weight: 194 lb 6.4 oz (88.179 kg)  SpO2: 98%   Body mass index is 33.35 kg/(m^2).  Physical Exam  Constitutional: She appears well-developed and well-nourished.  HENT:  Mouth/Throat: Oropharynx is clear and moist. No oropharyngeal exudate.  Eyes: Pupils are equal, round, and reactive to light.  No scleral  icterus.  Neck: Neck supple. Carotid bruit is not present. No tracheal deviation present. No thyromegaly present.  Cardiovascular: Normal rate, regular rhythm, normal heart sounds and intact distal pulses.  Exam reveals no gallop and no friction rub.   No murmur heard. No LE edema b/l. no calf TTP.   Pulmonary/Chest: Effort normal and breath sounds normal. No stridor. No respiratory distress. She has no wheezes. She has no rales.  Abdominal: Soft. Bowel sounds are normal. She exhibits no distension and no mass. There is no hepatomegaly. There is no tenderness. There is no rebound and no guarding.  Musculoskeletal: She exhibits edema and tenderness.  Lymphadenopathy:    She has no cervical adenopathy.  Neurological: She is alert. She has normal reflexes.  Skin: Skin is warm and dry. No rash noted.  Left medial leg abrasion but no d/c or increased redness  Psychiatric: Her behavior is normal. Thought content normal. Her mood appears anxious.   Diabetic Foot Exam - Simple   Simple Foot Form  Diabetic Foot exam was performed with the following findings:  Yes 01/15/2015  5:48 PM  Visual Inspection  See comments:  Yes  Sensation Testing  Intact to touch and monofilament testing bilaterally:  Yes  Pulse Check  Posterior Tibialis and Dorsalis pulse intact bilaterally:  Yes  Comments  R>L bunion but no ulcerations or calluses        Labs reviewed: No visits with results within 3 Month(s) from this visit. Latest known visit with results is:  Appointment on 10/08/2014  Component Date Value Ref Range Status  . WBC 10/08/2014 6.9  3.4 - 10.8 x10E3/uL Final  . RBC 10/08/2014 3.99  3.77 - 5.28 x10E6/uL Final  . Hemoglobin 10/08/2014 11.3  11.1 - 15.9 g/dL Final  . Hematocrit 10/08/2014 34.4  34.0 - 46.6 % Final  . MCV 10/08/2014 86  79 - 97 fL Final  . MCH 10/08/2014 28.3  26.6 - 33.0 pg Final  . MCHC 10/08/2014 32.8  31.5 - 35.7 g/dL Final  . RDW 10/08/2014 16.3* 12.3 - 15.4 % Final  .  Platelets 10/08/2014 251  150 - 379 x10E3/uL Final  . Neutrophils 10/08/2014 53   Final  . Lymphs 10/08/2014 33   Final  . Monocytes 10/08/2014 6   Final  . Eos 10/08/2014 8   Final  . Basos 10/08/2014 0   Final  . Neutrophils Absolute 10/08/2014 3.6  1.4 - 7.0 x10E3/uL Final  . Lymphocytes Absolute 10/08/2014 2.3  0.7 - 3.1 x10E3/uL Final  . Monocytes Absolute 10/08/2014 0.4  0.1 - 0.9 x10E3/uL Final  . EOS (ABSOLUTE) 10/08/2014 0.5* 0.0 - 0.4 x10E3/uL Final  . Basophils Absolute 10/08/2014 0.0  0.0 - 0.2 x10E3/uL Final  . Immature Granulocytes 10/08/2014 0   Final  . Immature Grans (Abs) 10/08/2014 0.0  0.0 - 0.1 x10E3/uL Final  . Glucose 10/08/2014 106* 65 - 99 mg/dL Final  . BUN 10/08/2014 24  8 - 27 mg/dL Final  . Creatinine, Ser 10/08/2014 1.39* 0.57 - 1.00 mg/dL Final  . GFR calc non Af Amer 10/08/2014 39* >59 mL/min/1.73 Final  . GFR calc Af Amer 10/08/2014 45* >59 mL/min/1.73 Final  . BUN/Creatinine Ratio 10/08/2014 17  11 - 26 Final  . Sodium 10/08/2014 141  134 - 144 mmol/L Final  . Potassium 10/08/2014 4.6  3.5 - 5.2 mmol/L Final  . Chloride 10/08/2014 107  97 - 108 mmol/L Final  . CO2 10/08/2014 20  18 -  29 mmol/L Final  . Calcium 10/08/2014 9.4  8.7 - 10.3 mg/dL Final  . Total Protein 10/08/2014 6.6  6.0 - 8.5 g/dL Final  . Albumin 10/08/2014 3.8  3.6 - 4.8 g/dL Final  . Globulin, Total 10/08/2014 2.8  1.5 - 4.5 g/dL Final  . Albumin/Globulin Ratio 10/08/2014 1.4  1.1 - 2.5 Final  . Bilirubin Total 10/08/2014 0.3  0.0 - 1.2 mg/dL Final  . Alkaline Phosphatase 10/08/2014 145* 39 - 117 IU/L Final  . AST 10/08/2014 25  0 - 40 IU/L Final  . ALT 10/08/2014 37* 0 - 32 IU/L Final  . Hgb A1c MFr Bld 10/08/2014 6.4* 4.8 - 5.6 % Final   Comment:          Pre-diabetes: 5.7 - 6.4          Diabetes: >6.4          Glycemic control for adults with diabetes: <7.0   . Est. average glucose Bld gHb Est-m* 10/08/2014 137   Final  . Cholesterol, Total 10/08/2014 204* 100 - 199 mg/dL  Final  . Triglycerides 10/08/2014 170* 0 - 149 mg/dL Final  . HDL 10/08/2014 52  >39 mg/dL Final   Comment: According to ATP-III Guidelines, HDL-C >59 mg/dL is considered a negative risk factor for CHD.   Marland Kitchen VLDL Cholesterol Cal 10/08/2014 34  5 - 40 mg/dL Final  . LDL Calculated 10/08/2014 118* 0 - 99 mg/dL Final  . Chol/HDL Ratio 10/08/2014 3.9  0.0 - 4.4 ratio units Final   Comment:                                   T. Chol/HDL Ratio                                             Men  Women                               1/2 Avg.Risk  3.4    3.3                                   Avg.Risk  5.0    4.4                                2X Avg.Risk  9.6    7.1                                3X Avg.Risk 23.4   11.0     No results found.   Assessment/Plan     ICD-9-CM ICD-10-CM   1. Insomnia - uncontrolled 780.52 G47.00 zolpidem (AMBIEN) 10 MG tablet  2. DM (diabetes mellitus) type II controlled, neurological manifestation - controlled 250.60 E11.49 CMP     Hemoglobin A1c     Microalbumin/Creatinine Ratio, Urine  3. Hypertension, essential  - borderline controllled 401.9 I10   4. Hyperlipidemia - LDL not at goal  (<100) 272.4 E78.5 Lipid Panel  5. Chronic kidney disease, unspecified stage - stable 585.9 N18.9   6. Chronic  knee pain, right - stable 719.46 M25.561    338.29 G89.29   7. Depression with anxiety - uncontrolled 300.4 F41.8 ALPRAZolam (XANAX) 0.5 MG tablet    Increase zolpidem to 10 mg at bedtime  Continue other medications as ordered  Will call with lab results  Follow up in 3 mos with [name] for routine visit  Monica S. Perlie Gold  [facility] [address] [place], [place] [address] [phone] Cell (Monday-Friday 8 AM - 5 PM) [phone] After 5 PM and follow prompts

## 2015-01-16 LAB — LIPID PANEL
Chol/HDL Ratio: 4.4 ratio units (ref 0.0–4.4)
Cholesterol, Total: 231 mg/dL — ABNORMAL HIGH (ref 100–199)
HDL: 52 mg/dL (ref >39)
TRIGLYCERIDES: 417 mg/dL — AB (ref 0–149)

## 2015-01-16 LAB — COMPREHENSIVE METABOLIC PANEL
A/G RATIO: 1.5 (ref 1.1–2.5)
ALK PHOS: 144 IU/L — AB (ref 39–117)
ALT: 27 IU/L (ref 0–32)
AST: 21 IU/L (ref 0–40)
Albumin: 3.8 g/dL (ref 3.6–4.8)
BUN/Creatinine Ratio: 16 (ref 11–26)
BUN: 25 mg/dL (ref 8–27)
Bilirubin Total: 0.2 mg/dL (ref 0.0–1.2)
CALCIUM: 9.4 mg/dL (ref 8.7–10.3)
CHLORIDE: 108 mmol/L (ref 97–108)
CO2: 21 mmol/L (ref 18–29)
Creatinine, Ser: 1.6 mg/dL — ABNORMAL HIGH (ref 0.57–1.00)
GFR calc Af Amer: 38 mL/min/{1.73_m2} — ABNORMAL LOW (ref >59)
GFR, EST NON AFRICAN AMERICAN: 33 mL/min/{1.73_m2} — AB (ref >59)
Globulin, Total: 2.6 g/dL (ref 1.5–4.5)
Glucose: 86 mg/dL (ref 65–99)
POTASSIUM: 4.7 mmol/L (ref 3.5–5.2)
Sodium: 143 mmol/L (ref 134–144)
Total Protein: 6.4 g/dL (ref 6.0–8.5)

## 2015-01-16 LAB — HEMOGLOBIN A1C
Est. average glucose Bld gHb Est-mCnc: 140 mg/dL
Hgb A1c MFr Bld: 6.5 % — ABNORMAL HIGH (ref 4.8–5.6)

## 2015-01-16 LAB — MICROALBUMIN / CREATININE URINE RATIO
Creatinine, Urine: 262.8 mg/dL
MICROALB/CREAT RATIO: 1864.9 mg/g{creat} — AB (ref 0.0–30.0)
Microalbumin, Urine: 4900.9 ug/mL

## 2015-01-18 ENCOUNTER — Other Ambulatory Visit: Payer: Self-pay | Admitting: Internal Medicine

## 2015-01-20 DIAGNOSIS — L281 Prurigo nodularis: Secondary | ICD-10-CM | POA: Diagnosis not present

## 2015-01-20 DIAGNOSIS — L309 Dermatitis, unspecified: Secondary | ICD-10-CM | POA: Diagnosis not present

## 2015-01-26 ENCOUNTER — Other Ambulatory Visit: Payer: Self-pay | Admitting: *Deleted

## 2015-01-26 MED ORDER — AMBULATORY NON FORMULARY MEDICATION: Status: DC

## 2015-01-26 NOTE — Telephone Encounter (Signed)
Patient called and wanted [place] to call in her Diabetic supplies to [facility] #: [phone]. I called and spoke with Larkin Ina and Trumetrix Meter, Lancets and test strips verbal Rx given. With #[id number] Patient notified Rx were called to pharmacy and agreed.

## 2015-02-04 DIAGNOSIS — M1711 Unilateral primary osteoarthritis, right knee: Secondary | ICD-10-CM | POA: Diagnosis not present

## 2015-02-11 DIAGNOSIS — M1711 Unilateral primary osteoarthritis, right knee: Secondary | ICD-10-CM | POA: Diagnosis not present

## 2015-02-18 ENCOUNTER — Other Ambulatory Visit: Payer: Self-pay | Admitting: Internal Medicine

## 2015-02-18 ENCOUNTER — Other Ambulatory Visit: Payer: Self-pay

## 2015-02-18 DIAGNOSIS — M1711 Unilateral primary osteoarthritis, right knee: Secondary | ICD-10-CM | POA: Diagnosis not present

## 2015-02-18 NOTE — Telephone Encounter (Signed)
Okay to give 30-day supply.

## 2015-02-18 NOTE — Telephone Encounter (Signed)
Was this sent with the updated quantity?

## 2015-02-18 NOTE — Telephone Encounter (Signed)
Patient is requesting refill on Tramadol, please confirm dispense number as 30. Based on instructions patient should be receiving a larger dispense number, please advise.

## 2015-02-19 MED ORDER — TRAMADOL HCL 50 MG PO TABS: ORAL_TABLET | ORAL | Status: DC

## 2015-03-02 ENCOUNTER — Other Ambulatory Visit: Payer: Self-pay | Admitting: Internal Medicine

## 2015-03-03 DIAGNOSIS — L300 Nummular dermatitis: Secondary | ICD-10-CM | POA: Diagnosis not present

## 2015-03-03 DIAGNOSIS — L281 Prurigo nodularis: Secondary | ICD-10-CM | POA: Diagnosis not present

## 2015-04-14 DIAGNOSIS — L300 Nummular dermatitis: Secondary | ICD-10-CM | POA: Diagnosis not present

## 2015-04-14 DIAGNOSIS — L281 Prurigo nodularis: Secondary | ICD-10-CM | POA: Diagnosis not present

## 2015-04-22 ENCOUNTER — Ambulatory Visit (INDEPENDENT_AMBULATORY_CARE_PROVIDER_SITE_OTHER): Payer: Commercial Managed Care - HMO | Admitting: Nurse Practitioner

## 2015-04-22 ENCOUNTER — Encounter: Payer: Self-pay | Admitting: Nurse Practitioner

## 2015-04-22 VITALS — BP 136/88 | HR 94 | Temp 97.5°F | Ht 64.0 in | Wt 203.0 lb

## 2015-04-22 DIAGNOSIS — F418 Other specified anxiety disorders: Secondary | ICD-10-CM

## 2015-04-22 DIAGNOSIS — E1140 Type 2 diabetes mellitus with diabetic neuropathy, unspecified: Secondary | ICD-10-CM | POA: Diagnosis not present

## 2015-04-22 DIAGNOSIS — E785 Hyperlipidemia, unspecified: Secondary | ICD-10-CM | POA: Diagnosis not present

## 2015-04-22 DIAGNOSIS — Z794 Long term (current) use of insulin: Secondary | ICD-10-CM | POA: Diagnosis not present

## 2015-04-22 DIAGNOSIS — D509 Iron deficiency anemia, unspecified: Secondary | ICD-10-CM | POA: Diagnosis not present

## 2015-04-22 DIAGNOSIS — N189 Chronic kidney disease, unspecified: Secondary | ICD-10-CM

## 2015-04-22 DIAGNOSIS — G4700 Insomnia, unspecified: Secondary | ICD-10-CM

## 2015-04-22 DIAGNOSIS — I10 Essential (primary) hypertension: Secondary | ICD-10-CM

## 2015-04-22 DIAGNOSIS — Z23 Encounter for immunization: Secondary | ICD-10-CM | POA: Diagnosis not present

## 2015-04-22 MED ORDER — ZOSTER VACCINE LIVE 19400 UNT/0.65ML ~~LOC~~ SOLR: 0.6500 mL | Freq: Once | SUBCUTANEOUS | Status: DC

## 2015-04-22 MED ORDER — PRAVASTATIN SODIUM 40 MG PO TABS: ORAL_TABLET | ORAL | Status: DC

## 2015-04-22 MED ORDER — PAROXETINE HCL 40 MG PO TABS
40.0000 mg | ORAL_TABLET | Freq: Every day | ORAL | Status: DC
Start: 2015-04-22 — End: 2015-08-27

## 2015-04-22 MED ORDER — ZOLPIDEM TARTRATE 5 MG PO TABS: ORAL_TABLET | ORAL | Status: DC

## 2015-04-22 NOTE — Progress Notes (Signed)
Patient ID: BENJIE WORTHAN, female   DOB: [birth year], 69 y.o.   MRN: [id number]    PCP: Sharon Seller, NP  Advanced Directive information Does patient have an advance directive?: No, Would patient like information on creating an advanced directive?: No - patient declined information  Allergies  Allergen Reactions  . Morphine And Related     nauseated  . Pioglitazone     REACTION: Desquamation of skin of the palm  . Sulfonamide Derivatives     rash    Chief Complaint  Patient presents with  . Medical Management of Chronic Issues    3 month follow-up, not fasting. BS 175 fasting (04/12/15).   Marland Kitchen Headache    Patient c/o of headaches in the morning x long time   . Medication Management    Discuss restarting Paxil     HPI: Patient is a 69 y.o. female seen in the office today for routine follow up. Pt with a pmh of DM, HTN, hyperlipidemia, depression, anxiety, and insomnia.  Having headaches when she wakes up in the morning. Base of the neck and goes to the top of her head. Worse when she gets aggravated.  Increase stress in regards to her father and insurance.  Stopped paxil because she wanted to but feels like she needs to be back on it. Stopped xanax too and temperament is bad.  Has also stopped cholesterol medication. Triglycerides high on last labs.  Poor diet since thanksgiving. Eating what she wants, when she wants.  Blood sugars- ranging from as low as 72, mostly 100-140 Likes sweets Not routinely taking iron Review of Systems:  Review of Systems  Constitutional: Negative for fever, activity change, appetite change and fatigue.  HENT: Negative for congestion, rhinorrhea and sinus pressure.   Respiratory: Negative for shortness of breath.   Cardiovascular: Negative for chest pain and leg swelling.  Gastrointestinal: Negative for constipation and abdominal distention.  Genitourinary: Negative for difficulty urinating.  Musculoskeletal: Positive for arthralgias (knees  and back pain).       Pain controlled on medication  Allergic/Immunologic: Negative for environmental allergies.  Neurological: Positive for headaches. Negative for dizziness, weakness, light-headedness and numbness.  Psychiatric/Behavioral: Positive for agitation. The patient is nervous/anxious.     Past Medical History  Diagnosis Date  . Diabetes mellitus   . Asthma     childhood  . Depression   . Anxiety   . Hypertension   . Hyperlipidemia   . 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   Marland Kitchen Hx MRSA infection 2009  . Dysphagia   . Personal history of colonic polyps 10/17/2010    hyperplastic  . Barrett's esophagus   . Neuromuscular disorder (HCC)     chronic pain  . Arthritis     lower back and knees   . Eczema    Past Surgical History  Procedure Laterality Date  . Abdominal hysterectomy      unclear when  . Thigh surg      left side due to MRSA  . Cataract extraction Bilateral    Social History:   reports that she has been smoking Cigarettes.  She has a 10 pack-year smoking history. She has never used smokeless tobacco. She reports that she drinks about 1.2 oz of alcohol per week. She reports that she does not use illicit drugs.  Family History  Problem Relation Age of Onset  . Colon cancer Neg Hx   .  Hypertension Father   . Diabetes Brother     Medications: Patient's Medications  New Prescriptions   No medications on file  Previous Medications   ALPRAZOLAM (XANAX) 0.5 MG TABLET    Take 1 tablet (0.5 mg total) by mouth 2 (two) times daily as needed. for anxiety   AMBULATORY NON FORMULARY MEDICATION    Trumetrix Meter Use to check blood sugar twice daily Dx: E11.49   AMBULATORY NON FORMULARY MEDICATION    Trumetrix Lancets 33G Use to test blood sugar Twice daily. Dx: E11.49   AMBULATORY NON FORMULARY MEDICATION    Trumetrix Test Strips Use to test blood sugar twice daily. Dx E11.49    AMLODIPINE (NORVASC) 5 MG TABLET    Take 1 tablet (5 mg total) by mouth daily.   ASPIRIN EC 81 MG TABLET    Take 1 tablet (81 mg total) by mouth daily.   CLOBETASOL CREAM (TEMOVATE) 0.05 %    Apply 1 application topically at bedtime.    FERROUS SULFATE 325 (65 FE) MG TABLET    Take 325 mg by mouth 2 (two) times daily with a meal.   HYDROXYZINE (ATARAX/VISTARIL) 10 MG TABLET    Take 10 mg by mouth every 6 (six) hours as needed for itching.   INSULIN SYRINGE-NEEDLE U-100 (INSULIN SYRINGE .5CC/31GX5/16") 31G X 5/16" 0.5 ML MISC    1 each by Does not apply route 2 (two) times daily before a meal.   LATANOPROST (XALATAN) 0.005 % OPHTHALMIC SOLUTION    Place 1 drop into both eyes daily.    LISINOPRIL (PRINIVIL,ZESTRIL) 40 MG TABLET    Take one tablet by mouth once daily for blood pressure   LORATADINE (CLARITIN) 10 MG TABLET    Take 10 mg by mouth as needed.   OMEPRAZOLE (PRILOSEC) 40 MG CAPSULE    Take 1 capsule (40 mg total) by mouth daily.   PRAVASTATIN (PRAVACHOL) 40 MG TABLET    TAKE ONE TABLET BY MOUTH ONCE DAILY   TRAMADOL (ULTRAM) 50 MG TABLET    1 by mouth every 6 hours as needed   TRIAMCINOLONE CREAM (KENALOG) 0.1 %    Apply to affected area 2-3 times daily   ZOLPIDEM (AMBIEN) 10 MG TABLET    1 tab po qhs for sleep  Modified Medications   Modified Medication Previous Medication   INSULIN LISPRO PROT & LISPRO (HUMALOG MIX 75/25 KWIKPEN) (75-25) 100 UNIT/ML KWIKPEN Insulin Lispro Prot & Lispro (HUMALOG MIX 75/25 KWIKPEN) (75-25) 100 UNIT/ML Kwikpen      Inject 15 Units into the skin 2 (two) times daily.    Inject 10 Units into the skin 2 (two) times daily.  Discontinued Medications   BLOOD GLUCOSE MONITORING SUPPL (ONE TOUCH ULTRA SYSTEM KIT) W/DEVICE KIT    Check blood sugar twice daily DX E11.49   METFORMIN (GLUCOPHAGE) 1000 MG TABLET    Take one tablet by mouth twice daily with meals to control blood sugar. May break tablet in half for easier swallowing.   PAROXETINE (PAXIL) 40 MG TABLET     Take 1 tablet (40 mg total) by mouth daily.     Physical Exam:  Filed Vitals:   04/22/15 1013  BP: 136/88  Pulse: 94  Temp: 97.5 F (36.4 C)  TempSrc: Oral  Height: 5\' 4"  (1.626 m)  Weight: 203 lb (92.08 kg)  SpO2: 96%   Body mass index is 34.83 kg/(m^2).  Physical Exam  Constitutional: She is oriented to person, place, and time. She appears well-developed  and well-nourished. No distress.  HENT:  Head: Normocephalic and atraumatic.  Eyes: Conjunctivae are normal. Pupils are equal, round, and reactive to light.  Neck: Normal range of motion. Neck supple.  Cardiovascular: Normal rate, regular rhythm, normal heart sounds and intact distal pulses.   Pulmonary/Chest: Effort normal and breath sounds normal.  Abdominal: Soft. Bowel sounds are normal.  Musculoskeletal: She exhibits tenderness (to right knee). She exhibits no edema.  Neurological: She is alert and oriented to person, place, and time.  Skin: Skin is warm and dry. She is not diaphoretic.  Psychiatric: Her affect is blunt.    Labs reviewed: Basic Metabolic Panel:  Recent Labs  28/41/32 0826 01/15/15 1534  NA 141 143  K 4.6 4.7  CL 107 108  CO2 20 21  GLUCOSE 106* 86  BUN 24 25  CREATININE 1.39* 1.60*  CALCIUM 9.4 9.4   Liver Function Tests:  Recent Labs  10/08/14 0826 01/15/15 1534  AST 25 21  ALT 37* 27  ALKPHOS 145* 144*  BILITOT 0.3 0.2  PROT 6.6 6.4  ALBUMIN 3.8 3.8   No results for input(s): LIPASE, AMYLASE in the last 8760 hours. No results for input(s): AMMONIA in the last 8760 hours. CBC:  Recent Labs  10/08/14 0826  WBC 6.9  NEUTROABS 3.6  HCT 34.4   Lipid Panel:  Recent Labs  10/08/14 0826 01/15/15 1534  CHOL 204* 231*  HDL 52 52  LDLCALC 118* Comment  TRIG 170* 417*  CHOLHDL 3.9 4.4   TSH: No results for input(s): TSH in the last 8760 hours. A1C: Lab Results  Component Value Date   HGBA1C 6.5* 01/15/2015     Assessment/Plan 1. Controlled type 2 diabetes  mellitus with diabetic neuropathy, with long-term current use of insulin (HCC) -blood sugars stable on recall, did not bring log, reports compliance with medications   Will follow up Hemoglobin A1c  2. Hypertension, essential -stable, to cont current reigmen  3. Hyperlipidemia -triglycerides elevated on recent labs, encouraged diet and medication compliance.  - pravastatin (PRAVACHOL) 40 MG tablet; TAKE ONE TABLET BY MOUTH ONCE DAILY  Dispense: 30 tablet; Refill: 6 - Lipid panel; Future  4. Chronic kidney disease, unspecified stage -remains on ACEi, to avoid NSAIDs and stay hydrated  - Comprehensive metabolic panel  5. Depression with anxiety -worse due to being caregiver to father. Will restart paxil at this time.  - PARoxetine (PAXIL) 40 MG tablet; Take 1 tablet (40 mg total) by mouth daily.  Dispense: 30 tablet; Refill: 2  6. Anemia, iron deficiency -not taking iron, will follow up CBC - CBC with Differential  7. Insomnia -hopefully treatment of anxiety will help insomnia, will decrease ambien to 5 mg qhs  - zolpidem (AMBIEN) 5 MG tablet; 1 tab po qhs for sleep  Jovi Alvizo K. Biagio Borg  [facility] [phone] 8 am - 5 pm) [phone] (after hours)

## 2015-04-22 NOTE — Patient Instructions (Signed)
Start taking cholesterol medication -- Pravachol Cholesterol was elevated!  Decrease ambien to 5 mg daily  Restart Paxil 40 mg daily for anxiety    Follow up in 6 weeks with fasting blood work prior to appt to follow up cholesterol and anxiety

## 2015-04-23 ENCOUNTER — Other Ambulatory Visit: Payer: Self-pay | Admitting: *Deleted

## 2015-04-23 DIAGNOSIS — I10 Essential (primary) hypertension: Secondary | ICD-10-CM

## 2015-04-23 LAB — COMPREHENSIVE METABOLIC PANEL
A/G RATIO: 1.3 (ref 1.1–2.5)
ALT: 30 IU/L (ref 0–32)
AST: 21 IU/L (ref 0–40)
Albumin: 3.2 g/dL — ABNORMAL LOW (ref 3.6–4.8)
Alkaline Phosphatase: 243 IU/L — ABNORMAL HIGH (ref 39–117)
BILIRUBIN TOTAL: 0.3 mg/dL (ref 0.0–1.2)
BUN/Creatinine Ratio: 17 (ref 11–26)
BUN: 32 mg/dL — ABNORMAL HIGH (ref 8–27)
CALCIUM: 8.8 mg/dL (ref 8.7–10.3)
CHLORIDE: 107 mmol/L — AB (ref 96–106)
CO2: 21 mmol/L (ref 18–29)
Creatinine, Ser: 1.88 mg/dL — ABNORMAL HIGH (ref 0.57–1.00)
GFR calc Af Amer: 31 mL/min/{1.73_m2} — ABNORMAL LOW (ref >59)
GFR calc non Af Amer: 27 mL/min/{1.73_m2} — ABNORMAL LOW (ref >59)
Globulin, Total: 2.5 g/dL (ref 1.5–4.5)
Glucose: 159 mg/dL — ABNORMAL HIGH (ref 65–99)
POTASSIUM: 4.7 mmol/L (ref 3.5–5.2)
Sodium: 143 mmol/L (ref 134–144)
Total Protein: 5.7 g/dL — ABNORMAL LOW (ref 6.0–8.5)

## 2015-04-23 LAB — HEMOGLOBIN A1C
ESTIMATED AVERAGE GLUCOSE: 143 mg/dL
Hgb A1c MFr Bld: 6.6 % — ABNORMAL HIGH (ref 4.8–5.6)

## 2015-04-23 LAB — CBC WITH DIFFERENTIAL/PLATELET
BASOS ABS: 0 10*3/uL (ref 0.0–0.2)
BASOS: 0 %
EOS (ABSOLUTE): 0.3 10*3/uL (ref 0.0–0.4)
Eos: 3 %
Hematocrit: 32.6 % — ABNORMAL LOW (ref 34.0–46.6)
Hemoglobin: 11.3 g/dL (ref 11.1–15.9)
IMMATURE GRANULOCYTES: 0 %
Immature Grans (Abs): 0 10*3/uL (ref 0.0–0.1)
Lymphocytes Absolute: 2.5 10*3/uL (ref 0.7–3.1)
Lymphs: 23 %
MCH: 29.8 pg (ref 26.6–33.0)
MCHC: 34.7 g/dL (ref 31.5–35.7)
MCV: 86 fL (ref 79–97)
MONOS ABS: 0.6 10*3/uL (ref 0.1–0.9)
Monocytes: 5 %
NEUTROS PCT: 69 %
Neutrophils Absolute: 7.5 10*3/uL — ABNORMAL HIGH (ref 1.4–7.0)
PLATELETS: 287 10*3/uL (ref 150–379)
RBC: 3.79 x10E6/uL (ref 3.77–5.28)
RDW: 14.4 % (ref 12.3–15.4)
WBC: 10.9 10*3/uL — ABNORMAL HIGH (ref 3.4–10.8)

## 2015-04-23 MED ORDER — LISINOPRIL 40 MG PO TABS: ORAL_TABLET | ORAL | Status: DC

## 2015-04-23 NOTE — Telephone Encounter (Signed)
[facility] 

## 2015-05-05 ENCOUNTER — Other Ambulatory Visit: Payer: Self-pay | Admitting: *Deleted

## 2015-05-05 MED ORDER — OMEPRAZOLE 40 MG PO CPDR: 40.0000 mg | DELAYED_RELEASE_CAPSULE | Freq: Every day | ORAL | Status: DC

## 2015-05-05 NOTE — Telephone Encounter (Signed)
[facility] 

## 2015-05-10 ENCOUNTER — Other Ambulatory Visit: Payer: Self-pay | Admitting: *Deleted

## 2015-05-10 DIAGNOSIS — I10 Essential (primary) hypertension: Secondary | ICD-10-CM

## 2015-05-10 MED ORDER — AMLODIPINE BESYLATE 5 MG PO TABS: 5.0000 mg | ORAL_TABLET | Freq: Every day | ORAL | Status: DC

## 2015-05-10 NOTE — Telephone Encounter (Signed)
[facility] 

## 2015-06-03 ENCOUNTER — Ambulatory Visit (INDEPENDENT_AMBULATORY_CARE_PROVIDER_SITE_OTHER): Payer: Commercial Managed Care - HMO | Admitting: Nurse Practitioner

## 2015-06-03 ENCOUNTER — Encounter: Payer: Self-pay | Admitting: Nurse Practitioner

## 2015-06-03 DIAGNOSIS — M7071 Other bursitis of hip, right hip: Secondary | ICD-10-CM

## 2015-06-03 DIAGNOSIS — E2839 Other primary ovarian failure: Secondary | ICD-10-CM

## 2015-06-03 MED ORDER — DICLOFENAC SODIUM 1 % TD GEL: 4.0000 g | Freq: Four times a day (QID) | TRANSDERMAL | Status: DC | PRN

## 2015-06-03 NOTE — Patient Instructions (Signed)
Rest joint Apply ice to the injured area: Place ice in a plastic bag. Place a towel between your skin and the bag. Leave the ice on for 20 minutes, 2-3 times a day. To use Voltaren gel every 6 hours as needed To use tramadol- may use 1-2 tablet every 6 hours (MAX of 6 tablets in 24 hours) Will need injection if pain persist- will need to send you to orthopedics   Bursitis Bursitis is inflammation and irritation of a bursa, which is one of the small, fluid-filled sacs that cushion and protect the moving parts of your body. These sacs are located between bones and muscles, muscle attachments, or skin areas next to bones. A bursa protects these structures from the wear and tear that results from frequent movement. An inflamed bursa causes pain and swelling. Fluid may build up inside the sac. Bursitis is most common near joints, especially the knees, elbows, hips, and shoulders. CAUSES Bursitis can be caused by:   Injury from:  A direct blow, like falling on your knee or elbow.  Overuse of a joint (repetitive stress).  Infection. This can happen if bacteria gets into a bursa through a cut or scrape near a joint.  Diseases that cause joint inflammation, such as gout and rheumatoid arthritis. RISK FACTORS You may be at risk for bursitis if you:   Have a job or hobby that involves a lot of repetitive stress on your joints.  Have a condition that weakens your body's defense system (immune system), such as diabetes, cancer, or HIV.  Lift and reach overhead often.  Kneel or lean on hard surfaces often.  Run or walk often. SIGNS AND SYMPTOMS The most common signs and symptoms of bursitis are:  Pain that gets worse when you move the affected body part or put weight on it.  Inflammation.  Stiffness. Other signs and symptoms may include:  Redness.  Tenderness.  Warmth.  Pain that continues after rest.  Fever and chills. This may occur in bursitis caused by  infection. DIAGNOSIS Bursitis may be diagnosed by:   Medical history and physical exam.  MRI.  A procedure to drain fluid from the bursa with a needle (aspiration). The fluid may be checked for signs of infection or gout.  Blood tests to rule out other causes of inflammation. TREATMENT  Bursitis can usually be treated at home with rest, ice, compression, and elevation (RICE). For mild bursitis, RICE treatment may be all you need. Other treatments may include:  Nonsteroidal anti-inflammatory drugs (NSAIDs) to treat pain and inflammation.  Corticosteroids to fight inflammation. You may have these drugs injected into and around the area of bursitis.  Aspiration of bursitis fluid to relieve pain and improve movement.  Antibiotic medicine to treat an infected bursa.  A splint, brace, or walking aid.  Physical therapy if you continue to have pain or limited movement.  Surgery to remove a damaged or infected bursa. This may be needed if you have a very bad case of bursitis or if other treatments have not worked. HOME CARE INSTRUCTIONS   Take medicines only as directed by your health care provider.  If you were prescribed an antibiotic medicine, finish it all even if you start to feel better.  Rest the affected area as directed by your health care provider.  Keep the area elevated.  Avoid activities that make pain worse.  Apply ice to the injured area:  Place ice in a plastic bag.  Place a towel between your skin  and the bag.  Leave the ice on for 20 minutes, 2-3 times a day.  Use splints, braces, pads, or walking aids as directed by your health care provider.  Keep all follow-up visits as directed by your health care provider. This is important. PREVENTION   Wear knee pads if you kneel often.  Wear sturdy running or walking shoes that fit you well.  Take regular breaks from repetitive activity.  Warm up by stretching before doing any strenuous activity.  Maintain  a healthy weight or lose weight as recommended by your health care provider. Ask your health care provider if you need help.  Exercise regularly. Start any new physical activity gradually. SEEK MEDICAL CARE IF:   Your bursitis is not responding to treatment or home care.  You have a fever.  You have chills.   This information is not intended to replace advice given to you by your health care provider. Make sure you discuss any questions you have with your health care provider.   Document Released: 04/07/2000 Document Revised: 12/30/2014 Document Reviewed: 06/30/2013 Elsevier Interactive Patient Education [employer].

## 2015-06-03 NOTE — Progress Notes (Signed)
Patient ID: [name], female   DOB: Aug 30, 1945, 70 y.o.   MRN: [id number]    PCP: [name], NP  Advanced Directive information Does patient have an advance directive?: No, Would patient like information on creating an advanced directive?: Yes - Educational materials given  Allergies  Allergen Reactions  . Morphine And Related     nauseated  . Pioglitazone     REACTION: Desquamation of skin of the palm  . Sulfonamide Derivatives     rash    Chief Complaint  Patient presents with  . Acute Visit    Right hip pain x 1 week, patient pulled a muscle. patient with BS readings  . Advance Directive    Discuss HPOA/Living Will   . Orders    BMD     HPI: Patient is a 70 y.o. female seen in the office today due to hip pain. Left hip has been hurting for 1 week. Got out of the bathtub and felt something pull. Has needed to use her walking stick to help her. Overall pain is better than when it first happened. Pain is a 6/10. Took a tramadol and a heat patch. Which helped some. Tramadol bringing pain from a 6 to a 5.  Able to walk better now. No pain down legs. No incontinence of bowel or bladder.   Review of Systems:  Review of Systems  Constitutional: Negative for fever, activity change, appetite change and fatigue.  HENT: Negative for congestion, rhinorrhea and sinus pressure.   Respiratory: Negative for shortness of breath.   Cardiovascular: Negative for chest pain and leg swelling.  Gastrointestinal: Negative for constipation and abdominal distention.  Genitourinary: Negative for difficulty urinating.  Musculoskeletal: Positive for arthralgias (knees and back pain).       Pain controlled on medication  Allergic/Immunologic: Negative for environmental allergies.  Neurological: Negative for weakness.    Past Medical History  Diagnosis Date  . Diabetes mellitus   . Asthma     childhood  . Depression   . Anxiety   . Hypertension   . Hyperlipidemia   . History  of herpes zoster 02/2010    Recovered fully after period of acute herpetic neuralgia tx w/ gabapentin.   Marland Kitchen Hx MRSA infection 2009  . Dysphagia   . Personal history of colonic polyps 10/17/2010    hyperplastic  . Barrett's esophagus   . Neuromuscular disorder (HCC)     chronic pain  . Arthritis     lower back and knees   . Eczema    Past Surgical History  Procedure Laterality Date  . Abdominal hysterectomy      unclear when  . Thigh surg      left side due to MRSA  . Cataract extraction Bilateral    Social History:   reports that she has been smoking Cigarettes.  She has a 10 pack-year smoking history. She has never used smokeless tobacco. She reports that she drinks about 1.2 oz of alcohol per week. She reports that she does not use illicit drugs.  Family History  Problem Relation Age of Onset  . Colon cancer Neg Hx   . Hypertension Father   . Diabetes Brother     Medications: Patient's Medications  New Prescriptions   No medications on file  Previous Medications   ALPRAZOLAM (XANAX) 0.5 MG TABLET    Take 1 tablet (0.5 mg total) by mouth 2 (two) times daily as needed. for anxiety   AMBULATORY NON FORMULARY MEDICATION  Trumetrix Meter Use to check blood sugar twice daily Dx: E11.49   AMBULATORY NON FORMULARY MEDICATION    Trumetrix Lancets 33G Use to test blood sugar Twice daily. Dx: E11.49   AMBULATORY NON FORMULARY MEDICATION    Trumetrix Test Strips Use to test blood sugar twice daily. Dx E11.49   AMLODIPINE (NORVASC) 5 MG TABLET    Take 1 tablet (5 mg total) by mouth daily.   ASPIRIN EC 81 MG TABLET    Take 1 tablet (81 mg total) by mouth daily.   CLOBETASOL CREAM (TEMOVATE) 0.05 %    Apply 1 application topically at bedtime.    HYDROXYZINE (ATARAX/VISTARIL) 10 MG TABLET    Take 10 mg by mouth every 6 (six) hours as needed for itching.   INSULIN LISPRO PROT & LISPRO (HUMALOG MIX 75/25 KWIKPEN) (75-25) 100 UNIT/ML KWIKPEN    Inject 15 Units into the skin 2 (two)  times daily.   INSULIN SYRINGE-NEEDLE U-100 (INSULIN SYRINGE .5CC/31GX5/16") 31G X 5/16" 0.5 ML MISC    1 each by Does not apply route 2 (two) times daily before a meal.   LATANOPROST (XALATAN) 0.005 % OPHTHALMIC SOLUTION    Place 1 drop into both eyes daily.    LISINOPRIL (PRINIVIL,ZESTRIL) 40 MG TABLET    Take one tablet by mouth once daily for blood pressure   LORATADINE (CLARITIN) 10 MG TABLET    Take 10 mg by mouth as needed.   OMEPRAZOLE (PRILOSEC) 40 MG CAPSULE    Take 1 capsule (40 mg total) by mouth daily.   PAROXETINE (PAXIL) 40 MG TABLET    Take 1 tablet (40 mg total) by mouth daily.   PRAVASTATIN (PRAVACHOL) 40 MG TABLET    TAKE ONE TABLET BY MOUTH ONCE DAILY   TRAMADOL (ULTRAM) 50 MG TABLET    1 by mouth every 6 hours as needed   TRIAMCINOLONE CREAM (KENALOG) 0.1 %    Apply to affected area 2-3 times daily   ZOLPIDEM (AMBIEN) 5 MG TABLET    1 tab po qhs for sleep   ZOSTER VACCINE LIVE, PF, (ZOSTAVAX) 09811 UNT/0.65ML INJECTION    Inject 19,400 Units into the skin once.  Modified Medications   No medications on file  Discontinued Medications   No medications on file     Physical Exam:  Filed Vitals:   06/03/15 0824  BP: 138/88  Pulse: 88  Temp: 98.2 F (36.8 C)  TempSrc: Oral  Height: 5\' 4"  (1.626 m)  Weight: 190 lb (86.183 kg)  SpO2: 96%   Body mass index is 32.6 kg/(m^2).  Physical Exam  Constitutional: She is oriented to person, place, and time. She appears well-developed and well-nourished. No distress.  HENT:  Head: Normocephalic and atraumatic.  Eyes: Conjunctivae are normal. Pupils are equal, round, and reactive to light.  Neck: Normal range of motion. Neck supple.  Cardiovascular: Normal rate, regular rhythm, normal heart sounds and intact distal pulses.   Pulmonary/Chest: Effort normal and breath sounds normal.  Abdominal: Soft. Bowel sounds are normal.  Musculoskeletal: She exhibits tenderness (to right lateral trochanter). She exhibits no edema.        Right hip: She exhibits normal range of motion, normal strength and no swelling.  Neurological: She is alert and oriented to person, place, and time.  Skin: Skin is warm and dry. She is not diaphoretic.  Psychiatric: Her affect is blunt.    Labs reviewed: Basic Metabolic Panel:  Recent Labs  10/08/14 0826 01/15/15 1534 04/22/15 1108  NA 141 143 143  K 4.6 4.7 4.7  CL 107 108 107*  CO2 20 21 21   GLUCOSE 106* 86 159*  BUN 24 25 32*  CREATININE 1.39* 1.60* 1.88*  CALCIUM 9.4 9.4 8.8   Liver Function Tests:  Recent Labs  10/08/14 0826 01/15/15 1534 04/22/15 1108  AST 25 21 21   ALT 37* 27 30  ALKPHOS 145* 144* 243*  BILITOT 0.3 0.2 0.3  PROT 6.6 6.4 5.7*  ALBUMIN 3.8 3.8 3.2*   No results for input(s): LIPASE, AMYLASE in the last 8760 hours. No results for input(s): AMMONIA in the last 8760 hours. CBC:  Recent Labs  10/08/14 0826 04/22/15 1108  WBC 6.9 10.9*  NEUTROABS 3.6 7.5*  HCT 34.4 32.6*  MCV 86 86  PLT 251 287   Lipid Panel:  Recent Labs  10/08/14 0826 01/15/15 1534  CHOL 204* 231*  HDL 52 52  LDLCALC 118* Comment  TRIG 170* 417*  CHOLHDL 3.9 4.4   TSH: No results for input(s): TSH in the last 8760 hours. A1C: Lab Results  Component Value Date   HGBA1C 6.6* 04/22/2015     Assessment/Plan 1. Estrogen deficiency - DG Bone Density order for screening  2. Bursitis, hip, right Rest joint Apply ice to the injured area: Place ice in a plastic bag. Place a towel between your skin and the bag. Leave the ice on for 20 minutes, 2-3 times a day. To use Voltaren gel every 6 hours as needed To use tramadol- may use 1-2 tablet every 6 hours (MAX of 6 tablets in 24 hours) Will need injection if pain persist- will need to go  to orthopedics- pt reports she already has ortho doctor and will make follow up if needed - diclofenac sodium (VOLTAREN) 1 % GEL; Apply 4 g topically 4 (four) times daily as needed.  Dispense: 100 g; Refill: 0   Jessica  K. Harle Battiest  [facility] [phone] 8 am - 5 pm) [phone] (after hours)

## 2015-06-05 ENCOUNTER — Other Ambulatory Visit: Payer: Self-pay | Admitting: Nurse Practitioner

## 2015-06-21 ENCOUNTER — Other Ambulatory Visit: Payer: Self-pay | Admitting: *Deleted

## 2015-06-21 MED ORDER — INSULIN LISPRO PROT & LISPRO (75-25 MIX) 100 UNIT/ML KWIKPEN: 15.0000 [IU] | PEN_INJECTOR | Freq: Two times a day (BID) | SUBCUTANEOUS | Status: DC

## 2015-06-21 NOTE — Telephone Encounter (Signed)
Patient called and stated that she needed a refill on her insulin called to her pharmacy. Stated that she was borrowing someone's phone and would not be able to call her back, just to fax it to her pharmacy.

## 2015-06-22 ENCOUNTER — Telehealth: Payer: Self-pay

## 2015-06-22 NOTE — Telephone Encounter (Signed)
Alternative medication request received from [facility] regarding Humalog Mix 75-25 Kwikpen. Prior authorization was initiated by calling [phone]. Patient ID # [id number].   Awaiting determination. Reference # [id number]

## 2015-06-23 NOTE — Telephone Encounter (Signed)
Received fax from [facility] [phone] and Humalog Mix APPROVED until 04/23/2016. Patient Aware.

## 2015-06-24 ENCOUNTER — Ambulatory Visit (INDEPENDENT_AMBULATORY_CARE_PROVIDER_SITE_OTHER): Payer: Commercial Managed Care - HMO | Admitting: Nurse Practitioner

## 2015-06-24 ENCOUNTER — Encounter: Payer: Self-pay | Admitting: Nurse Practitioner

## 2015-06-24 VITALS — BP 140/90 | HR 92 | Temp 97.8°F | Resp 20 | Ht 64.0 in | Wt 189.0 lb

## 2015-06-24 DIAGNOSIS — F329 Major depressive disorder, single episode, unspecified: Secondary | ICD-10-CM

## 2015-06-24 DIAGNOSIS — F32A Depression, unspecified: Secondary | ICD-10-CM

## 2015-06-24 DIAGNOSIS — G43009 Migraine without aura, not intractable, without status migrainosus: Secondary | ICD-10-CM | POA: Diagnosis not present

## 2015-06-24 DIAGNOSIS — E1149 Type 2 diabetes mellitus with other diabetic neurological complication: Secondary | ICD-10-CM

## 2015-06-24 DIAGNOSIS — R519 Headache, unspecified: Secondary | ICD-10-CM | POA: Insufficient documentation

## 2015-06-24 DIAGNOSIS — G43909 Migraine, unspecified, not intractable, without status migrainosus: Secondary | ICD-10-CM | POA: Insufficient documentation

## 2015-06-24 DIAGNOSIS — R51 Headache: Secondary | ICD-10-CM

## 2015-06-24 LAB — GLUCOSE, POCT (MANUAL RESULT ENTRY): POC Glucose: 70 mg/dl (ref 70–99)

## 2015-06-24 MED ORDER — SUMATRIPTAN SUCCINATE 25 MG PO TABS: ORAL_TABLET | ORAL | Status: DC

## 2015-06-24 NOTE — Progress Notes (Signed)
Patient ID: [name], female   DOB: [birth year], 70 y.o.   MRN: [id number]    PCP: [name], NP  Advanced Directive information Does patient have an advance directive?: No, Would patient like information on creating an advanced directive?: Yes - Educational materials given  Allergies  Allergen Reactions  . Morphine And Related     nauseated  . Pioglitazone     REACTION: Desquamation of skin of the palm  . Sulfonamide Derivatives     rash    Chief Complaint  Patient presents with  . Acute Visit    headache on the left-side of her head     HPI: Patient is a 70 y.o. female seen in the office today for headaches. Coming to left side of head around her temple. Dull pain. 6/10. Notices it when she gets upset increase stress. Not effective by light or sound. Piercing pain. Last anywhere from 2-3 days.  No nausea or vomiting. No visual changes. Headaches off and on for a month. Last one started 4 days ago. Tramadol helps the headaches but minimally.  Pt reports she has been having these headaches for a long time. Just  Her father pasted away last week. Service was last week. She lost a day over the weekend. Had to ask what day it was on Monday. Increase stress Decreased appetite. Blood sugar this afternoon was 70.  Reports she has been out of it.  Just wanting to cry.  Has been depressed.  Denies any SI or HI.   Review of Systems:  Review of Systems  Constitutional: Negative for fever, activity change, appetite change and fatigue.  HENT: Negative for congestion, rhinorrhea and sinus pressure.   Eyes: Negative for pain, redness and visual disturbance.  Respiratory: Negative for shortness of breath.   Cardiovascular: Negative for chest pain and leg swelling.  Gastrointestinal: Negative for constipation and abdominal distention.  Genitourinary: Negative for difficulty urinating.  Musculoskeletal: Positive for arthralgias (knees and back pain).       Pain controlled on  medication  Allergic/Immunologic: Negative for environmental allergies.  Neurological: Positive for headaches. Negative for dizziness, tremors, seizures, syncope, facial asymmetry, speech difficulty, weakness, light-headedness and numbness.  Psychiatric/Behavioral: Positive for sleep disturbance.       Increase depression after father has passed away    Past Medical History  Diagnosis Date  . Diabetes mellitus   . Asthma     childhood  . Depression   . Anxiety   . Hypertension   . Hyperlipidemia   . 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   Marland Kitchen Hx MRSA infection 2009  . Dysphagia   . Personal history of colonic polyps 10/17/2010    hyperplastic  . Barrett's esophagus   . Neuromuscular disorder (HCC)     chronic pain  . Arthritis     lower back and knees   . Eczema    Past Surgical History  Procedure Laterality Date  . Abdominal hysterectomy      unclear when  . Thigh surg      left side due to MRSA  . Cataract extraction Bilateral    Social History:   reports that she has been smoking Cigarettes.  She has a 10 pack-year smoking history. She has never used smokeless tobacco. She reports that she drinks about 1.2 oz of alcohol per week. She reports that she does not use illicit drugs.  Family  History  Problem Relation Age of Onset  . Colon cancer Neg Hx   . Hypertension Father   . Diabetes Brother     Medications: Patient's Medications  New Prescriptions   No medications on file  Previous Medications   ALPRAZOLAM (XANAX) 0.5 MG TABLET    Take 1 tablet (0.5 mg total) by mouth 2 (two) times daily as needed. for anxiety   AMBULATORY NON FORMULARY MEDICATION    Trumetrix Lancets 33G Use to test blood sugar Twice daily. Dx: E11.49   AMBULATORY NON FORMULARY MEDICATION    Trumetrix Test Strips Use to test blood sugar twice daily. Dx E11.49   AMLODIPINE (NORVASC) 5 MG TABLET    Take 1 tablet (5 mg  total) by mouth daily.   ASPIRIN EC 81 MG TABLET    Take 1 tablet (81 mg total) by mouth daily.   CLOBETASOL CREAM (TEMOVATE) 0.05 %    Apply 1 application topically at bedtime.    DICLOFENAC SODIUM (VOLTAREN) 1 % GEL    Apply 4 g topically 4 (four) times daily as needed.   HYDROXYZINE (ATARAX/VISTARIL) 10 MG TABLET    Take 10 mg by mouth every 6 (six) hours as needed for itching.   INSULIN LISPRO PROT & LISPRO (HUMALOG MIX 75/25 KWIKPEN) (75-25) 100 UNIT/ML KWIKPEN    Inject 15 Units into the skin 2 (two) times daily.   INSULIN SYRINGE-NEEDLE U-100 (INSULIN SYRINGE .5CC/31GX5/16") 31G X 5/16" 0.5 ML MISC    1 each by Does not apply route 2 (two) times daily before a meal.   LATANOPROST (XALATAN) 0.005 % OPHTHALMIC SOLUTION    Place 1 drop into both eyes daily.    LISINOPRIL (PRINIVIL,ZESTRIL) 40 MG TABLET    Take one tablet by mouth once daily for blood pressure   LORATADINE (CLARITIN) 10 MG TABLET    Take 10 mg by mouth as needed.   OMEPRAZOLE (PRILOSEC) 40 MG CAPSULE    Take 1 capsule (40 mg total) by mouth daily.   PAROXETINE (PAXIL) 40 MG TABLET    Take 1 tablet (40 mg total) by mouth daily.   PRAVASTATIN (PRAVACHOL) 40 MG TABLET    TAKE ONE TABLET BY MOUTH ONCE DAILY   TRAMADOL (ULTRAM) 50 MG TABLET    1 by mouth every 6 hours as needed   TRIAMCINOLONE CREAM (KENALOG) 0.1 %    Apply to affected area 2-3 times daily   ZOLPIDEM (AMBIEN) 5 MG TABLET    1 tab po qhs for sleep   ZOSTER VACCINE LIVE, PF, (ZOSTAVAX) 60454 UNT/0.65ML INJECTION    Inject 19,400 Units into the skin once.  Modified Medications   No medications on file  Discontinued Medications   AMBULATORY NON FORMULARY MEDICATION    Trumetrix Meter Use to check blood sugar twice daily Dx: E11.49     Physical Exam:  Filed Vitals:   06/24/15 1606  BP: 140/90  Pulse: 92  Temp: 97.8 F (36.6 C)  TempSrc: Oral  Resp: 20  Height: 5\' 4"  (1.626 m)  Weight: 189 lb (85.73 kg)  SpO2: 98%   Body mass index is 32.43  kg/(m^2).  Physical Exam  Constitutional: She is oriented to person, place, and time. She appears well-developed and well-nourished. No distress.  HENT:  Head: Normocephalic and atraumatic.  Eyes: Conjunctivae are normal. Pupils are equal, round, and reactive to light.  Neck: Normal range of motion. Neck supple.  Cardiovascular: Normal rate, regular rhythm and normal heart sounds.   Pulmonary/Chest: Effort  normal and breath sounds normal.  Abdominal: Soft. Bowel sounds are normal.  Musculoskeletal: She exhibits no edema or tenderness.       Right hip: She exhibits normal range of motion, normal strength and no swelling.  Neurological: She is alert and oriented to person, place, and time. She displays normal reflexes. No cranial nerve deficit. She exhibits normal muscle tone. Coordination normal.  Skin: Skin is warm and dry. She is not diaphoretic.  Psychiatric: Her affect is blunt.    Labs reviewed: Basic Metabolic Panel:  Recent Labs  10/08/14 0826 01/15/15 1534 04/22/15 1108  NA 141 143 143  K 4.6 4.7 4.7  CL 107 108 107*  CO2 20 21 21   GLUCOSE 106* 86 159*  BUN 24 25 32*  CREATININE 1.39* 1.60* 1.88*  CALCIUM 9.4 9.4 8.8   Liver Function Tests:  Recent Labs  10/08/14 0826 01/15/15 1534 04/22/15 1108  AST 25 21 21   ALT 37* 27 30  ALKPHOS 145* 144* 243*  BILITOT 0.3 0.2 0.3  PROT 6.6 6.4 5.7*  ALBUMIN 3.8 3.8 3.2*   No results for input(s): LIPASE, AMYLASE in the last 8760 hours. No results for input(s): AMMONIA in the last 8760 hours. CBC:  Recent Labs  10/08/14 0826 04/22/15 1108  WBC 6.9 10.9*  NEUTROABS 3.6 7.5*  HCT 34.4 32.6*  MCV 86 86  PLT 251 287   Lipid Panel:  Recent Labs  10/08/14 0826 01/15/15 1534  CHOL 204* 231*  HDL 52 52  LDLCALC 118* Comment  TRIG 170* 417*  CHOLHDL 3.9 4.4   TSH: No results for input(s): TSH in the last 8760 hours. 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 Discussed proper meals and snack intake. Improper diet could also be contributing to headaches.  -may need adjustment of insulin if blood sugars remain low.   2. Nonintractable migraine, unspecified migraine type -pt reports headaches have occurred most of her life but lasting longer without relief because medication she normally takes she has been told to avoid.  -encouraged proper sleepy, hydration and nutrition.  -to avoid hypoglycemia.  - SUMAtriptan (IMITREX) 25 MG tablet; 1 tablet daily as needed for headache, May repeat in 2 hours if headache persists or recurs. Max of 2 tablets in 24 hours  Dispense: 20 tablet; Refill: 0 -to avoid tramadol while taking imitrex  3. Depression Situational depression after father died. -no thoughts of HI or SI, just needing to grieve after his death.  -conts on paxil  - to follow up in 2 weeks with Dr Eulas Post on mood and headaches    Carlos American. Harle Battiest  [facility] [phone] 8 am - 5 pm) [phone] (after hours)

## 2015-06-24 NOTE — Patient Instructions (Signed)
Sumatriptan 1 tablet daily as needed for headache, may repeat in 2 hours if headache persist- MAX of 2 in 24 hours  Do not take tramadol within 24 hours of taking sumatriptan   Proper diet- make sure you are eating 3 meals a day with snacks throughout the day   Follow up in 2 weeks on headaches and mood

## 2015-07-08 ENCOUNTER — Encounter: Payer: Self-pay | Admitting: Nurse Practitioner

## 2015-07-08 ENCOUNTER — Other Ambulatory Visit: Payer: Self-pay | Admitting: *Deleted

## 2015-07-08 DIAGNOSIS — I10 Essential (primary) hypertension: Secondary | ICD-10-CM

## 2015-07-08 MED ORDER — LISINOPRIL 40 MG PO TABS: ORAL_TABLET | ORAL | Status: DC

## 2015-07-08 NOTE — Telephone Encounter (Signed)
Patient requested refill to be faxed to pharmacy.  

## 2015-07-09 ENCOUNTER — Ambulatory Visit: Payer: Self-pay | Admitting: Internal Medicine

## 2015-07-09 ENCOUNTER — Ambulatory Visit (INDEPENDENT_AMBULATORY_CARE_PROVIDER_SITE_OTHER): Payer: Commercial Managed Care - HMO | Admitting: Internal Medicine

## 2015-07-09 ENCOUNTER — Encounter: Payer: Self-pay | Admitting: Internal Medicine

## 2015-07-09 VITALS — BP 130/90 | HR 89 | Temp 97.6°F | Resp 20 | Ht 64.0 in | Wt 191.8 lb

## 2015-07-09 DIAGNOSIS — G43009 Migraine without aura, not intractable, without status migrainosus: Secondary | ICD-10-CM | POA: Diagnosis not present

## 2015-07-09 DIAGNOSIS — I10 Essential (primary) hypertension: Secondary | ICD-10-CM | POA: Diagnosis not present

## 2015-07-09 DIAGNOSIS — F418 Other specified anxiety disorders: Secondary | ICD-10-CM

## 2015-07-09 MED ORDER — SUMATRIPTAN SUCCINATE 25 MG PO TABS: ORAL_TABLET | ORAL | Status: DC

## 2015-07-09 MED ORDER — ATENOLOL 25 MG PO TABS: 25.0000 mg | ORAL_TABLET | Freq: Every day | ORAL | Status: DC

## 2015-07-09 NOTE — Patient Instructions (Signed)
START Atenolol 25mg  daily for headache prevention  Continue other medications as ordered  Follow up in 1 month for HA

## 2015-07-09 NOTE — Progress Notes (Signed)
Patient ID: [name], female   DOB: 05-24-1945, 70 y.o.   MRN: [id number]    Location:    [facility]   Place of Service:   OFFICE  Chief Complaint  Patient presents with  . Follow-up    Follow-up for headache    HPI:  70 yo female seen today for f/u. She was seen about 2 weeks ago for HA, depression/anxiety. She is taking imitrex 1 tab every 2 days. HA begin at base of her neck and radiates to side of her head -->frontal. Dull. Pain is 6/10 on scale.    Depression/anxiety - improved since last visit. No SI/HI. Her father expired Feb 16th and she is griefing that loss. She takes alprazolam prn and paxil daily. Not interested in counseling  Migraine HA - Rx imitrex at last OV. She is having 1 every other day. they worsen with stress  Past Medical History  Diagnosis Date  . Diabetes mellitus   . Asthma     childhood  . Depression   . Anxiety   . Hypertension   . Hyperlipidemia   . 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   Marland Kitchen Hx MRSA infection 2009  . Dysphagia   . Personal history of colonic polyps 10/17/2010    hyperplastic  . Barrett's esophagus   . Neuromuscular disorder (HCC)     chronic pain  . Arthritis     lower back and knees   . Eczema     Past Surgical History  Procedure Laterality Date  . Abdominal hysterectomy      unclear when  . Thigh surg      left side due to MRSA  . Cataract extraction Bilateral     Patient Care Team: Sharon Seller, NP as PCP - General (Nurse Practitioner) Cristopher Estimable as Diabetes Educator Ernesto Rutherford, MD as Consulting Physician (Ophthalmology)  Social History   Social History  . Marital Status: Divorced    Spouse Name: N/A  . Number of Children: 0  . Years of Education: N/A   Occupational History  . Retired    Social History Main Topics  . Smoking status: Current Every Day Smoker -- 0.25 packs/day for 40 years    Types: Cigarettes  .  Smokeless tobacco: Never Used     Comment: smokes 1 pack of cigerettes a week- off and on  . Alcohol Use: 1.2 oz/week    2 Standard drinks or equivalent per week     Comment: wine and beer- occasionally   . Drug Use: No  . Sexual Activity: Not on file   Other Topics Concern  . Not on file   Social History Narrative   Financial assistance approved for 100% discount at [facility] and has [facility] card per Rudell Cobb   02/10/2010      3 caffeine drinks daily      reports that she has been smoking Cigarettes.  She has a 10 pack-year smoking history. She has never used smokeless tobacco. She reports that she drinks about 1.2 oz of alcohol per week. She reports that she does not use illicit drugs.  Allergies  Allergen Reactions  . Morphine And Related     nauseated  . Pioglitazone     REACTION: Desquamation of skin of the palm  . Sulfonamide Derivatives     rash    Medications: Patient's Medications  New Prescriptions   No medications  on file  Previous Medications   ALPRAZOLAM (XANAX) 0.5 MG TABLET    Take 1 tablet (0.5 mg total) by mouth 2 (two) times daily as needed. for anxiety   AMBULATORY NON FORMULARY MEDICATION    Trumetrix Lancets 33G Use to test blood sugar Twice daily. Dx: E11.49   AMBULATORY NON FORMULARY MEDICATION    Trumetrix Test Strips Use to test blood sugar twice daily. Dx E11.49   AMLODIPINE (NORVASC) 5 MG TABLET    Take 1 tablet (5 mg total) by mouth daily.   ASPIRIN EC 81 MG TABLET    Take 1 tablet (81 mg total) by mouth daily.   CLOBETASOL CREAM (TEMOVATE) 0.05 %    Apply 1 application topically at bedtime.    DICLOFENAC SODIUM (VOLTAREN) 1 % GEL    Apply 4 g topically 4 (four) times daily as needed.   HYDROXYZINE (ATARAX/VISTARIL) 10 MG TABLET    Take 10 mg by mouth every 6 (six) hours as needed for itching.   INSULIN LISPRO PROT & LISPRO (HUMALOG MIX 75/25 KWIKPEN) (75-25) 100 UNIT/ML KWIKPEN    Inject 15 Units into the skin 2 (two) times daily.   INSULIN  SYRINGE-NEEDLE U-100 (INSULIN SYRINGE .5CC/31GX5/16") 31G X 5/16" 0.5 ML MISC    1 each by Does not apply route 2 (two) times daily before a meal.   LATANOPROST (XALATAN) 0.005 % OPHTHALMIC SOLUTION    Place 1 drop into both eyes daily.    LISINOPRIL (PRINIVIL,ZESTRIL) 40 MG TABLET    Take one tablet by mouth once daily for blood pressure   LORATADINE (CLARITIN) 10 MG TABLET    Take 10 mg by mouth as needed.   OMEPRAZOLE (PRILOSEC) 40 MG CAPSULE    Take 1 capsule (40 mg total) by mouth daily.   PAROXETINE (PAXIL) 40 MG TABLET    Take 1 tablet (40 mg total) by mouth daily.   PRAVASTATIN (PRAVACHOL) 40 MG TABLET    TAKE ONE TABLET BY MOUTH ONCE DAILY   SUMATRIPTAN (IMITREX) 25 MG TABLET    1 tablet daily as needed for headache, May repeat in 2 hours if headache persists or recurs. Max of 2 tablets in 24 hours   TRAMADOL (ULTRAM) 50 MG TABLET    1 by mouth every 6 hours as needed   TRIAMCINOLONE CREAM (KENALOG) 0.1 %    Apply to affected area 2-3 times daily   ZOLPIDEM (AMBIEN) 5 MG TABLET    1 tab po qhs for sleep   ZOSTER VACCINE LIVE, PF, (ZOSTAVAX) 16109 UNT/0.65ML INJECTION    Inject 19,400 Units into the skin once.  Modified Medications   No medications on file  Discontinued Medications   No medications on file    Review of Systems  Neurological: Positive for headaches.  Psychiatric/Behavioral: Positive for sleep disturbance and dysphoric mood. The patient is nervous/anxious.   All other systems reviewed and are negative.   Filed Vitals:   07/09/15 1106  Pulse: 89  Temp: 97.6 F (36.4 C)  TempSrc: Oral  Resp: 20  Height: 5\' 4"  (1.626 m)  Weight: 191 lb 12.8 oz (87 kg)  SpO2: 97%   Body mass index is 32.91 kg/(m^2).  Physical Exam  Constitutional: She is oriented to person, place, and time. She appears well-developed.  HENT:  Mouth/Throat: Oropharynx is clear and moist. No oropharyngeal exudate.  Eyes: EOM are normal. Pupils are equal, round, and reactive to light. No  scleral icterus.  Neck: Neck supple. Muscular tenderness present. No  spinous process tenderness present. No rigidity. Decreased range of motion present.    Cardiovascular: Normal rate, regular rhythm and intact distal pulses.   Murmur (1/6 SEM) heard. No LE edema b/l. No calf TTP  Pulmonary/Chest: Effort normal and breath sounds normal. No respiratory distress. She has no wheezes. She has no rales. She exhibits no tenderness.  Musculoskeletal: She exhibits edema and tenderness.  Lymphadenopathy:    She has no cervical adenopathy.  Neurological: She is alert and oriented to person, place, and time.  Skin: Skin is warm and dry. No rash noted.     Labs reviewed: Office Visit on 06/24/2015  Component Date Value Ref Range Status  . POC Glucose 06/24/2015 70  70 - 99 mg/dl Final  Office Visit on 04/22/2015  Component Date Value Ref Range Status  . WBC 04/22/2015 10.9* 3.4 - 10.8 x10E3/uL Final  . RBC 04/22/2015 3.79  3.77 - 5.28 x10E6/uL Final  . Hemoglobin 04/22/2015 11.3  11.1 - 15.9 g/dL Final  . Hematocrit 46/27/0350 32.6* 34.0 - 46.6 % Final  . MCV 04/22/2015 86  79 - 97 fL Final  . MCH 04/22/2015 29.8  26.6 - 33.0 pg Final  . MCHC 04/22/2015 34.7  31.5 - 35.7 g/dL Final  . RDW 09/38/1829 14.4  12.3 - 15.4 % Final  . Platelets 04/22/2015 287  150 - 379 x10E3/uL Final  . Neutrophils 04/22/2015 69   Final  . Lymphs 04/22/2015 23   Final  . Monocytes 04/22/2015 5   Final  . Eos 04/22/2015 3   Final  . Basos 04/22/2015 0   Final  . Neutrophils Absolute 04/22/2015 7.5* 1.4 - 7.0 x10E3/uL Final  . Lymphocytes Absolute 04/22/2015 2.5  0.7 - 3.1 x10E3/uL Final  . Monocytes Absolute 04/22/2015 0.6  0.1 - 0.9 x10E3/uL Final  . EOS (ABSOLUTE) 04/22/2015 0.3  0.0 - 0.4 x10E3/uL Final  . Basophils Absolute 04/22/2015 0.0  0.0 - 0.2 x10E3/uL Final  . Immature Granulocytes 04/22/2015 0   Final  . Immature Grans (Abs) 04/22/2015 0.0  0.0 - 0.1 x10E3/uL Final  . Glucose 04/22/2015 159* 65 -  99 mg/dL Final  . BUN 93/71/6967 32* 8 - 27 mg/dL Final  . Creatinine, Ser 04/22/2015 1.88* 0.57 - 1.00 mg/dL Final  . GFR calc non Af Amer 04/22/2015 27* >59 mL/min/1.73 Final  . GFR calc Af Amer 04/22/2015 31* >59 mL/min/1.73 Final  . BUN/Creatinine Ratio 04/22/2015 17  11 - 26 Final  . Sodium 04/22/2015 143  134 - 144 mmol/L Final  . Potassium 04/22/2015 4.7  3.5 - 5.2 mmol/L Final  . Chloride 04/22/2015 107* 96 - 106 mmol/L Final  . CO2 04/22/2015 21  18 - 29 mmol/L Final  . Calcium 04/22/2015 8.8  8.7 - 10.3 mg/dL Final  . Total Protein 04/22/2015 5.7* 6.0 - 8.5 g/dL Final  . Albumin 89/38/1017 3.2* 3.6 - 4.8 g/dL Final  . Globulin, Total 04/22/2015 2.5  1.5 - 4.5 g/dL Final  . Albumin/Globulin Ratio 04/22/2015 1.3  1.1 - 2.5 Final  . Bilirubin Total 04/22/2015 0.3  0.0 - 1.2 mg/dL Final  . Alkaline Phosphatase 04/22/2015 243* 39 - 117 IU/L Final  . AST 04/22/2015 21  0 - 40 IU/L Final  . ALT 04/22/2015 30  0 - 32 IU/L Final  . Hgb A1c MFr Bld 04/22/2015 6.6* 4.8 - 5.6 % Final   Comment:          Pre-diabetes: 5.7 - 6.4  Diabetes: >6.4          Glycemic control for adults with diabetes: <7.0   . Est. average glucose Bld gHb Est-m* 04/22/2015 143   Final    No results found.   Assessment/Plan   ICD-9-CM ICD-10-CM   1. Nonintractable migraine, unspecified migraine type 346.10 G43.009 atenolol (TENORMIN) 25 MG tablet     SUMAtriptan (IMITREX) 25 MG tablet  2. Depression with anxiety 300.4 F41.8   3. Hypertension, essential 401.9 I10    START Atenolol 25mg  daily for headache prevention  Continue other medications as ordered  Follow up in 1 month for HA   Kavaughn Faucett S. Ancil Linsey  [facility] [address] [place], [place] [address] [phone] Cell (Monday-Friday 8 AM - 5 PM) [phone] After 5 PM and follow prompts

## 2015-08-18 ENCOUNTER — Other Ambulatory Visit: Payer: Self-pay | Admitting: Internal Medicine

## 2015-08-25 DIAGNOSIS — L219 Seborrheic dermatitis, unspecified: Secondary | ICD-10-CM | POA: Diagnosis not present

## 2015-08-25 DIAGNOSIS — L209 Atopic dermatitis, unspecified: Secondary | ICD-10-CM | POA: Diagnosis not present

## 2015-08-27 ENCOUNTER — Encounter: Payer: Self-pay | Admitting: Internal Medicine

## 2015-08-27 ENCOUNTER — Ambulatory Visit (INDEPENDENT_AMBULATORY_CARE_PROVIDER_SITE_OTHER): Payer: Commercial Managed Care - HMO | Admitting: Internal Medicine

## 2015-08-27 VITALS — BP 168/92 | HR 64 | Temp 97.6°F | Resp 20 | Ht 64.0 in | Wt 189.2 lb

## 2015-08-27 DIAGNOSIS — D509 Iron deficiency anemia, unspecified: Secondary | ICD-10-CM

## 2015-08-27 DIAGNOSIS — G4700 Insomnia, unspecified: Secondary | ICD-10-CM | POA: Diagnosis not present

## 2015-08-27 DIAGNOSIS — G43009 Migraine without aura, not intractable, without status migrainosus: Secondary | ICD-10-CM | POA: Diagnosis not present

## 2015-08-27 DIAGNOSIS — F418 Other specified anxiety disorders: Secondary | ICD-10-CM

## 2015-08-27 DIAGNOSIS — E1149 Type 2 diabetes mellitus with other diabetic neurological complication: Secondary | ICD-10-CM

## 2015-08-27 DIAGNOSIS — I10 Essential (primary) hypertension: Secondary | ICD-10-CM | POA: Diagnosis not present

## 2015-08-27 DIAGNOSIS — E785 Hyperlipidemia, unspecified: Secondary | ICD-10-CM

## 2015-08-27 MED ORDER — ALPRAZOLAM 0.5 MG PO TABS: 0.5000 mg | ORAL_TABLET | Freq: Two times a day (BID) | ORAL | Status: DC | PRN

## 2015-08-27 MED ORDER — PAROXETINE HCL 40 MG PO TABS: 40.0000 mg | ORAL_TABLET | Freq: Every day | ORAL | Status: DC

## 2015-08-27 MED ORDER — GLUCOSE BLOOD VI STRP: ORAL_STRIP | Status: DC

## 2015-08-27 MED ORDER — ZOLPIDEM TARTRATE 10 MG PO TABS: 10.0000 mg | ORAL_TABLET | Freq: Every evening | ORAL | Status: DC | PRN

## 2015-08-27 MED ORDER — ONETOUCH ULTRASOFT LANCETS MISC: Status: DC

## 2015-08-27 NOTE — Progress Notes (Signed)
Patient ID: [name], female   DOB: 1945/09/02, 70 y.o.   MRN: [id number]    Location:    [facility]   Place of Service:  OFFICE   Chief Complaint  Patient presents with  . Medical Management of Chronic Issues    HA x 2wks,     HPI:  70 yo female seen today for f/u migraine HA. She has not noticed any change on atenolol 25mg  daily. She takes triptan BID most days. She has had 3 HA this week. She reports increased stressors and needs RF ambien and xanax. She gets disoriented easily. (+) hot flashes. BS fluctuating at home. No polyuria or polydipsia. She is still smoking cigs and had one prior to OV today. No plan to quit.  Depression/anxiety - stable. No SI/HI. Her father expired Feb 16th and she is griefing that loss. She takes alprazolam BID prn. She ran out of paxil last month. Not interested in counseling  Insomnia - stable with ambien  DM - She takes humalog 75/25 15 units BID. A1c 6.6% in Dec 2016  HTN - stable on amlodipine and lisinopril. She is on ASA daily  Hyperlipidemia - no myalgias on pravastatin. LDL 118 in June 2016  Eczema - stable on steroid cream and atarax  Pain stable on prn tramadol and voltaren gel. Pain worse in right knee.   Glaucoma - uses eye gtts. She is followed by eye specialist but has not seen them recently  Past Medical History  Diagnosis Date  . Diabetes mellitus   . Asthma     childhood  . Depression   . Anxiety   . Hypertension   . Hyperlipidemia   . 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   Marland Kitchen Hx MRSA infection 2009  . Dysphagia   . Personal history of colonic polyps 10/17/2010    hyperplastic  . Barrett's esophagus   . Neuromuscular disorder (HCC)     chronic pain  . Arthritis     lower back and knees   . Eczema     Past Surgical History  Procedure Laterality Date  . Abdominal hysterectomy      unclear when  . Thigh surg      left side due to MRSA  .  Cataract extraction Bilateral     Patient Care Team: Sharon Seller, NP as PCP - General (Nurse Practitioner) Cristopher Estimable as Diabetes Educator Ernesto Rutherford, MD as Consulting Physician (Ophthalmology)  Social History   Social History  . Marital Status: Divorced    Spouse Name: N/A  . Number of Children: 0  . Years of Education: N/A   Occupational History  . Retired    Social History Main Topics  . Smoking status: Current Every Day Smoker -- 0.25 packs/day for 40 years    Types: Cigarettes  . Smokeless tobacco: Never Used     Comment: smokes 1 pack of cigerettes a week- off and on  . Alcohol Use: 1.2 oz/week    2 Standard drinks or equivalent per week     Comment: wine and beer- occasionally   . Drug Use: No  . Sexual Activity: Not on file   Other Topics Concern  . Not on file   Social History Narrative   Financial assistance approved for 100% discount at [facility] and has [facility] card per Rudell Cobb   02/10/2010      3 caffeine drinks  daily      reports that she has been smoking Cigarettes.  She has a 10 pack-year smoking history. She has never used smokeless tobacco. She reports that she drinks about 1.2 oz of alcohol per week. She reports that she does not use illicit drugs.  Allergies  Allergen Reactions  . Morphine And Related     nauseated  . Pioglitazone     REACTION: Desquamation of skin of the palm  . Sulfonamide Derivatives     rash    Medications: Patient's Medications  New Prescriptions   No medications on file  Previous Medications   ALPRAZOLAM (XANAX) 0.5 MG TABLET    Take 1 tablet (0.5 mg total) by mouth 2 (two) times daily as needed. for anxiety   AMBULATORY NON FORMULARY MEDICATION    Trumetrix Lancets 33G Use to test blood sugar Twice daily. Dx: E11.49   AMBULATORY NON FORMULARY MEDICATION    Trumetrix Test Strips Use to test blood sugar twice daily. Dx E11.49   AMLODIPINE (NORVASC) 5 MG TABLET    Take 1 tablet (5 mg total) by mouth  daily.   ASPIRIN EC 81 MG TABLET    Take 1 tablet (81 mg total) by mouth daily.   ATENOLOL (TENORMIN) 25 MG TABLET    Take 1 tablet (25 mg total) by mouth daily. For migraine headache   CLOBETASOL CREAM (TEMOVATE) 0.05 %    Apply 1 application topically at bedtime.    DICLOFENAC SODIUM (VOLTAREN) 1 % GEL    Apply 4 g topically 4 (four) times daily as needed.   HYDROXYZINE (ATARAX/VISTARIL) 10 MG TABLET    Take 10 mg by mouth every 6 (six) hours as needed for itching.   INSULIN LISPRO PROT & LISPRO (HUMALOG MIX 75/25 KWIKPEN) (75-25) 100 UNIT/ML KWIKPEN    Inject 15 Units into the skin 2 (two) times daily.   INSULIN SYRINGE-NEEDLE U-100 (INSULIN SYRINGE .5CC/31GX5/16") 31G X 5/16" 0.5 ML MISC    1 each by Does not apply route 2 (two) times daily before a meal.   LATANOPROST (XALATAN) 0.005 % OPHTHALMIC SOLUTION    Place 1 drop into both eyes daily. Reported on 08/27/2015   LISINOPRIL (PRINIVIL,ZESTRIL) 40 MG TABLET    Take one tablet by mouth once daily for blood pressure   LORATADINE (CLARITIN) 10 MG TABLET    Take 10 mg by mouth as needed.   OMEPRAZOLE (PRILOSEC) 40 MG CAPSULE    Take 1 capsule (40 mg total) by mouth daily.   PAROXETINE (PAXIL) 40 MG TABLET    Take 1 tablet (40 mg total) by mouth daily.   PRAVASTATIN (PRAVACHOL) 40 MG TABLET    TAKE ONE TABLET BY MOUTH ONCE DAILY   SUMATRIPTAN (IMITREX) 25 MG TABLET    1 tablet daily as needed for headache, May repeat in 2 hours if headache persists or recurs. Max of 2 tablets in 24 hours   TRAMADOL (ULTRAM) 50 MG TABLET    1 by mouth every 6 hours as needed   TRIAMCINOLONE CREAM (KENALOG) 0.1 %    Apply to affected area 2-3 times daily   ZOSTER VACCINE LIVE, PF, (ZOSTAVAX) [address] UNT/0.65ML INJECTION    Inject 19,400 Units into the skin once.  Modified Medications   No medications on file  Discontinued Medications   ZOLPIDEM (AMBIEN) 10 MG TABLET    take 1 tablet by mouth at bedtime for sleep   ZOLPIDEM (AMBIEN) 5 MG TABLET    1 tab po qhs  for  sleep    Review of Systems  Cardiovascular: Negative for chest pain.  Neurological: Negative for dizziness.  Psychiatric/Behavioral: Positive for sleep disturbance and dysphoric mood. The patient is nervous/anxious.   All other systems reviewed and are negative.   Filed Vitals:   08/27/15 0836  BP: 168/92  Pulse: 64  Temp: 97.6 F (36.4 C)  TempSrc: Oral  Resp: 20  Height: 5\' 4"  (1.626 m)  Weight: 189 lb 3.2 oz (85.821 kg)  SpO2: 96%   Body mass index is 32.46 kg/(m^2).  Physical Exam  Constitutional: She is oriented to person, place, and time. She appears well-developed and well-nourished.  HENT:  Mouth/Throat: Oropharynx is clear and moist. No oropharyngeal exudate.  Eyes: Pupils are equal, round, and reactive to light. No scleral icterus.  Neck: Neck supple. Carotid bruit is not present. No tracheal deviation present. No thyromegaly present.  Cardiovascular: Normal rate, regular rhythm, normal heart sounds and intact distal pulses.  Exam reveals no gallop and no friction rub.   No murmur heard. No LE edema b/l. no calf TTP.   Pulmonary/Chest: Effort normal and breath sounds normal. No stridor. No respiratory distress. She has no wheezes. She has no rales.  Abdominal: Soft. Bowel sounds are normal. She exhibits no distension and no mass. There is no hepatomegaly. There is no tenderness. There is no rebound and no guarding.  Musculoskeletal: She exhibits edema and tenderness.  Lymphadenopathy:    She has no cervical adenopathy.  Neurological: She is alert and oriented to person, place, and time.  Skin: Skin is warm and dry. No rash noted.  Psychiatric: Judgment and thought content normal. Her mood appears anxious. She is agitated.     Labs reviewed: Office Visit on 06/24/2015  Component Date Value Ref Range Status  . POC Glucose 06/24/2015 70  70 - 99 mg/dl Final    No results found.   Assessment/Plan   ICD-9-CM ICD-10-CM   1. Depression with anxiety 300.4  F41.8 PARoxetine (PAXIL) 40 MG tablet     ALPRAZolam (XANAX) 0.5 MG tablet  2. Type II diabetes mellitus with neurological manifestations (HCC) 250.60 E11.49 CMP     Hemoglobin A1c     Lancets (ONETOUCH ULTRASOFT) lancets     glucose blood test strip  3. Hypertension, essential 401.9 I10   4. Hyperlipidemia 272.4 E78.5 Lipid Panel  5. Nonintractable migraine, unspecified migraine type 346.10 G43.009   6. Anemia, iron deficiency 280.9 D50.9 CBC with Differential  7. Insomnia 780.52 G47.00 zolpidem (AMBIEN) 10 MG tablet   Continue current medications as ordered. Hot flashes most likely due to being out of paxil  Will call with lab results  Smoking cessation discussed and highly urged. She declined tx   Follow up in 1 month to reck blood pressure  Ercole Georg S. Ancil Linsey  [facility] and Adult Medicine [address] [place], [place] [address] [phone] Cell (Monday-Friday 8 AM - 5 PM) [phone] After 5 PM and follow prompts

## 2015-08-27 NOTE — Patient Instructions (Signed)
Continue current medications as ordered  Will call with lab results  Follow up in 1 month to reck blood pressure

## 2015-08-28 LAB — CBC WITH DIFFERENTIAL/PLATELET
BASOS ABS: 0.1 10*3/uL (ref 0.0–0.2)
Basos: 1 %
EOS (ABSOLUTE): 0.4 10*3/uL (ref 0.0–0.4)
Eos: 5 %
Hematocrit: 33.7 % — ABNORMAL LOW (ref 34.0–46.6)
Hemoglobin: 11.2 g/dL (ref 11.1–15.9)
IMMATURE GRANULOCYTES: 0 %
Immature Grans (Abs): 0 10*3/uL (ref 0.0–0.1)
Lymphocytes Absolute: 2.1 10*3/uL (ref 0.7–3.1)
Lymphs: 27 %
MCH: 28.6 pg (ref 26.6–33.0)
MCHC: 33.2 g/dL (ref 31.5–35.7)
MCV: 86 fL (ref 79–97)
MONOS ABS: 0.4 10*3/uL (ref 0.1–0.9)
Monocytes: 5 %
NEUTROS PCT: 62 %
Neutrophils Absolute: 4.8 10*3/uL (ref 1.4–7.0)
PLATELETS: 278 10*3/uL (ref 150–379)
RBC: 3.91 x10E6/uL (ref 3.77–5.28)
RDW: 15.4 % (ref 12.3–15.4)
WBC: 7.7 10*3/uL (ref 3.4–10.8)

## 2015-08-28 LAB — COMPREHENSIVE METABOLIC PANEL
A/G RATIO: 1.3 (ref 1.2–2.2)
ALK PHOS: 145 IU/L — AB (ref 39–117)
ALT: 20 IU/L (ref 0–32)
AST: 16 IU/L (ref 0–40)
Albumin: 3.5 g/dL — ABNORMAL LOW (ref 3.6–4.8)
BUN/Creatinine Ratio: 11 — ABNORMAL LOW (ref 12–28)
BUN: 27 mg/dL (ref 8–27)
Bilirubin Total: 0.3 mg/dL (ref 0.0–1.2)
CALCIUM: 9.4 mg/dL (ref 8.7–10.3)
CO2: 18 mmol/L (ref 18–29)
CREATININE: 2.35 mg/dL — AB (ref 0.57–1.00)
Chloride: 109 mmol/L — ABNORMAL HIGH (ref 96–106)
GFR calc Af Amer: 24 mL/min/{1.73_m2} — ABNORMAL LOW (ref >59)
GFR, EST NON AFRICAN AMERICAN: 21 mL/min/{1.73_m2} — AB (ref >59)
Globulin, Total: 2.6 g/dL (ref 1.5–4.5)
Glucose: 88 mg/dL (ref 65–99)
POTASSIUM: 4.2 mmol/L (ref 3.5–5.2)
Sodium: 146 mmol/L — ABNORMAL HIGH (ref 134–144)
Total Protein: 6.1 g/dL (ref 6.0–8.5)

## 2015-08-28 LAB — LIPID PANEL
CHOL/HDL RATIO: 3.9 ratio (ref 0.0–4.4)
CHOLESTEROL TOTAL: 270 mg/dL — AB (ref 100–199)
HDL: 69 mg/dL (ref >39)
LDL Calculated: 165 mg/dL — ABNORMAL HIGH (ref 0–99)
TRIGLYCERIDES: 182 mg/dL — AB (ref 0–149)
VLDL Cholesterol Cal: 36 mg/dL (ref 5–40)

## 2015-08-28 LAB — HEMOGLOBIN A1C
Est. average glucose Bld gHb Est-mCnc: 123 mg/dL
Hgb A1c MFr Bld: 5.9 % — ABNORMAL HIGH (ref 4.8–5.6)

## 2015-08-30 ENCOUNTER — Telehealth: Payer: Self-pay

## 2015-08-30 DIAGNOSIS — N289 Disorder of kidney and ureter, unspecified: Secondary | ICD-10-CM

## 2015-08-30 NOTE — Telephone Encounter (Signed)
Discuss with patient. Patient verbalized the understanding of the results. Scheduled lab appointment for Friday (patient had already taken medication today). Future orders placed. Copy of labs placed up front for pick up.

## 2015-08-30 NOTE — Telephone Encounter (Signed)
-----   Message from [place], DO sent at 08/28/2015  9:55 AM EDT ----- Kidney fxn reduced - push fluids; hold lisinopril x 3 days; repeat BMP in 4 days; cholesterol is elevated above goal - continue statin and watch fatty foods; other labs stable;

## 2015-09-03 ENCOUNTER — Other Ambulatory Visit: Payer: Commercial Managed Care - HMO

## 2015-09-03 DIAGNOSIS — N289 Disorder of kidney and ureter, unspecified: Secondary | ICD-10-CM

## 2015-09-04 ENCOUNTER — Other Ambulatory Visit: Payer: Self-pay | Admitting: Internal Medicine

## 2015-09-04 LAB — BASIC METABOLIC PANEL
BUN/Creatinine Ratio: 11 — ABNORMAL LOW (ref 12–28)
BUN: 26 mg/dL (ref 8–27)
CO2: 22 mmol/L (ref 18–29)
CREATININE: 2.37 mg/dL — AB (ref 0.57–1.00)
Calcium: 9 mg/dL (ref 8.7–10.3)
Chloride: 110 mmol/L — ABNORMAL HIGH (ref 96–106)
GFR calc non Af Amer: 20 mL/min/{1.73_m2} — ABNORMAL LOW (ref >59)
GFR, EST AFRICAN AMERICAN: 23 mL/min/{1.73_m2} — AB (ref >59)
Glucose: 121 mg/dL — ABNORMAL HIGH (ref 65–99)
Potassium: 5.6 mmol/L — ABNORMAL HIGH (ref 3.5–5.2)
SODIUM: 146 mmol/L — AB (ref 134–144)

## 2015-09-04 MED ORDER — SODIUM POLYSTYRENE SULFONATE 15 GM/60ML PO SUSP: 30.0000 g | Freq: Two times a day (BID) | ORAL | Status: DC

## 2015-09-06 ENCOUNTER — Other Ambulatory Visit: Payer: Self-pay | Admitting: *Deleted

## 2015-09-06 ENCOUNTER — Other Ambulatory Visit: Payer: Self-pay

## 2015-09-06 DIAGNOSIS — R799 Abnormal finding of blood chemistry, unspecified: Secondary | ICD-10-CM

## 2015-09-06 DIAGNOSIS — E875 Hyperkalemia: Secondary | ICD-10-CM

## 2015-09-06 DIAGNOSIS — R7989 Other specified abnormal findings of blood chemistry: Secondary | ICD-10-CM

## 2015-09-06 MED ORDER — INSULIN LISPRO PROT & LISPRO (75-25 MIX) 100 UNIT/ML KWIKPEN: 15.0000 [IU] | PEN_INJECTOR | Freq: Two times a day (BID) | SUBCUTANEOUS | Status: DC

## 2015-09-06 NOTE — Telephone Encounter (Signed)
Patient requested and faxed 

## 2015-09-08 ENCOUNTER — Other Ambulatory Visit: Payer: Commercial Managed Care - HMO

## 2015-09-08 DIAGNOSIS — R799 Abnormal finding of blood chemistry, unspecified: Secondary | ICD-10-CM | POA: Diagnosis not present

## 2015-09-08 DIAGNOSIS — E875 Hyperkalemia: Secondary | ICD-10-CM

## 2015-09-08 DIAGNOSIS — R7989 Other specified abnormal findings of blood chemistry: Secondary | ICD-10-CM

## 2015-09-09 LAB — BASIC METABOLIC PANEL
BUN / CREAT RATIO: 10 — AB (ref 12–28)
BUN: 20 mg/dL (ref 8–27)
CALCIUM: 8.6 mg/dL — AB (ref 8.7–10.3)
CHLORIDE: 113 mmol/L — AB (ref 96–106)
CO2: 21 mmol/L (ref 18–29)
CREATININE: 1.97 mg/dL — AB (ref 0.57–1.00)
GFR, EST AFRICAN AMERICAN: 29 mL/min/{1.73_m2} — AB (ref >59)
GFR, EST NON AFRICAN AMERICAN: 25 mL/min/{1.73_m2} — AB (ref >59)
Glucose: 69 mg/dL (ref 65–99)
Potassium: 4 mmol/L (ref 3.5–5.2)
Sodium: 148 mmol/L — ABNORMAL HIGH (ref 134–144)

## 2015-09-13 DIAGNOSIS — Z780 Asymptomatic menopausal state: Secondary | ICD-10-CM | POA: Diagnosis not present

## 2015-09-13 DIAGNOSIS — Z1231 Encounter for screening mammogram for malignant neoplasm of breast: Secondary | ICD-10-CM | POA: Diagnosis not present

## 2015-09-13 DIAGNOSIS — M8589 Other specified disorders of bone density and structure, multiple sites: Secondary | ICD-10-CM | POA: Diagnosis not present

## 2015-09-13 LAB — HM MAMMOGRAPHY

## 2015-10-01 ENCOUNTER — Ambulatory Visit (INDEPENDENT_AMBULATORY_CARE_PROVIDER_SITE_OTHER): Payer: Commercial Managed Care - HMO | Admitting: Internal Medicine

## 2015-10-01 ENCOUNTER — Encounter: Payer: Self-pay | Admitting: Internal Medicine

## 2015-10-01 VITALS — BP 130/90 | HR 84 | Temp 97.9°F | Ht 64.0 in | Wt 195.2 lb

## 2015-10-01 DIAGNOSIS — F418 Other specified anxiety disorders: Secondary | ICD-10-CM

## 2015-10-01 DIAGNOSIS — G4700 Insomnia, unspecified: Secondary | ICD-10-CM | POA: Diagnosis not present

## 2015-10-01 DIAGNOSIS — I10 Essential (primary) hypertension: Secondary | ICD-10-CM | POA: Diagnosis not present

## 2015-10-01 DIAGNOSIS — E1149 Type 2 diabetes mellitus with other diabetic neurological complication: Secondary | ICD-10-CM | POA: Diagnosis not present

## 2015-10-01 MED ORDER — ZOLPIDEM TARTRATE 10 MG PO TABS: 10.0000 mg | ORAL_TABLET | Freq: Every evening | ORAL | Status: DC | PRN

## 2015-10-01 MED ORDER — ALPRAZOLAM 0.5 MG PO TABS: 0.5000 mg | ORAL_TABLET | Freq: Two times a day (BID) | ORAL | Status: DC | PRN

## 2015-10-01 MED ORDER — INSULIN LISPRO PROT & LISPRO (75-25 MIX) 100 UNIT/ML KWIKPEN: 15.0000 [IU] | PEN_INJECTOR | Freq: Two times a day (BID) | SUBCUTANEOUS | Status: DC

## 2015-10-01 NOTE — Progress Notes (Signed)
Patient ID: [name], female   DOB: May 11, 1945, 70 y.o.   MRN: [id number]    Location:  [facility] Place of Service: OFFICE  Chief Complaint  Patient presents with  . Medical Management of Chronic Issues    1 month follow up BP    HPI:  70 yo female seen today for f/u HTN. She started alprazolam and reports anxiety improved. She also takes paxil 40mg  daily. She continues to have hot flashes intermittently. She is taking amlodipine and atenolol. BS at home 100-140s, occasionally in 70s, rarely >150. A1c 5.9%.  Hypernatremia - last Na 148. She attempts to drink water during the day.  CKD - Cr 1.97. Improved.  Past Medical History  Diagnosis Date  . Diabetes mellitus   . Asthma     childhood  . Depression   . Anxiety   . Hypertension   . Hyperlipidemia   . 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   Marland Kitchen Hx MRSA infection 2009  . Dysphagia   . Personal history of colonic polyps 10/17/2010    hyperplastic  . Barrett's esophagus   . Neuromuscular disorder (HCC)     chronic pain  . Arthritis     lower back and knees   . Eczema     Past Surgical History  Procedure Laterality Date  . Abdominal hysterectomy      unclear when  . Thigh surg      left side due to MRSA  . Cataract extraction Bilateral     Patient Care Team: [name], NP as PCP - General (Nurse Practitioner) Woodroe Mode as Diabetes Educator [name], MD as Consulting Physician (Ophthalmology)  Social History   Social History  . Marital Status: Divorced    Spouse Name: N/A  . Number of Children: 0  . Years of Education: N/A   Occupational History  . Retired    Social History Main Topics  . Smoking status: Current Every Day Smoker -- 0.25 packs/day for 40 years    Types: Cigarettes  . Smokeless tobacco: Never Used     Comment: smokes 1 pack of cigerettes a week- off and on  . Alcohol Use: 1.2 oz/week    2  Standard drinks or equivalent per week     Comment: wine and beer- occasionally   . Drug Use: No  . Sexual Activity: Not on file   Other Topics Concern  . Not on file   Social History Narrative   Financial assistance approved for 100% discount at [facility] and has [facility] card per Bonna Gains   02/10/2010      3 caffeine drinks daily      reports that she has been smoking Cigarettes.  She has a 10 pack-year smoking history. She has never used smokeless tobacco. She reports that she drinks about 1.2 oz of alcohol per week. She reports that she does not use illicit drugs.  Family History  Problem Relation Age of Onset  . Colon cancer Neg Hx   . Hypertension Father   . Diabetes Brother    Family Status  Relation Status Death Age  . Father Alive   . Brother Alive   . Mother Alive      Allergies  Allergen Reactions  . Morphine And Related     nauseated  . Pioglitazone     REACTION: Desquamation of skin of the palm  . Sulfonamide Derivatives  rash    Medications: Patient's Medications  New Prescriptions   No medications on file  Previous Medications   AMBULATORY NON FORMULARY MEDICATION    Trumetrix Lancets 33G Use to test blood sugar Twice daily. Dx: E11.49   AMBULATORY NON FORMULARY MEDICATION    Trumetrix Test Strips Use to test blood sugar twice daily. Dx E11.49   AMLODIPINE (NORVASC) 5 MG TABLET    Take 1 tablet (5 mg total) by mouth daily.   ASPIRIN EC 81 MG TABLET    Take 1 tablet (81 mg total) by mouth daily.   ATENOLOL (TENORMIN) 25 MG TABLET    Take 1 tablet (25 mg total) by mouth daily. For migraine headache   CLOBETASOL CREAM (TEMOVATE) 0.05 %    Apply 1 application topically at bedtime.    DICLOFENAC SODIUM (VOLTAREN) 1 % GEL    Apply 4 g topically 4 (four) times daily as needed.   GLUCOSE BLOOD TEST STRIP    Use as instructed   HYDROXYZINE (ATARAX/VISTARIL) 10 MG TABLET    Take 10 mg by mouth every 6 (six) hours as needed for itching.   INSULIN  SYRINGE-NEEDLE U-100 (INSULIN SYRINGE .5CC/31GX5/16") 31G X 5/16" 0.5 ML MISC    1 each by Does not apply route 2 (two) times daily before a meal.   LANCETS (ONETOUCH ULTRASOFT) LANCETS    Use as instructed   LATANOPROST (XALATAN) 0.005 % OPHTHALMIC SOLUTION    Place 1 drop into both eyes daily. Reported on 08/27/2015   LISINOPRIL (PRINIVIL,ZESTRIL) 40 MG TABLET    Take one tablet by mouth once daily for blood pressure   LORATADINE (CLARITIN) 10 MG TABLET    Take 10 mg by mouth as needed.   OMEPRAZOLE (PRILOSEC) 40 MG CAPSULE    Take 1 capsule (40 mg total) by mouth daily.   PAROXETINE (PAXIL) 40 MG TABLET    Take 1 tablet (40 mg total) by mouth daily.   PRAVASTATIN (PRAVACHOL) 40 MG TABLET    TAKE ONE TABLET BY MOUTH ONCE DAILY   SODIUM POLYSTYRENE (KAYEXALATE) 15 GM/60ML SUSPENSION    Take 120 mLs (30 g total) by mouth 2 (two) times daily. For elevated potassium   SUMATRIPTAN (IMITREX) 25 MG TABLET    1 tablet daily as needed for headache, May repeat in 2 hours if headache persists or recurs. Max of 2 tablets in 24 hours   TRAMADOL (ULTRAM) 50 MG TABLET    1 by mouth every 6 hours as needed   TRIAMCINOLONE CREAM (KENALOG) 0.1 %    Apply to affected area 2-3 times daily  Modified Medications   Modified Medication Previous Medication   ALPRAZOLAM (XANAX) 0.5 MG TABLET ALPRAZolam (XANAX) 0.5 MG tablet      Take 1 tablet (0.5 mg total) by mouth 2 (two) times daily as needed. for anxiety    Take 1 tablet (0.5 mg total) by mouth 2 (two) times daily as needed. for anxiety   INSULIN LISPRO PROT & LISPRO (HUMALOG MIX 75/25 KWIKPEN) (75-25) 100 UNIT/ML KWIKPEN Insulin Lispro Prot & Lispro (HUMALOG MIX 75/25 KWIKPEN) (75-25) 100 UNIT/ML Kwikpen      Inject 15 Units into the skin 2 (two) times daily.    Inject 15 Units into the skin 2 (two) times daily.   ZOLPIDEM (AMBIEN) 10 MG TABLET zolpidem (AMBIEN) 10 MG tablet      Take 1 tablet (10 mg total) by mouth at bedtime as needed for sleep.    Take 1 tablet (  10  mg total) by mouth at bedtime as needed for sleep.  Discontinued Medications   ZOSTER VACCINE LIVE, PF, (ZOSTAVAX) [address] UNT/0.65ML INJECTION    Inject 19,400 Units into the skin once.    Review of Systems  Endocrine:       Hotflashes  Psychiatric/Behavioral: Positive for sleep disturbance and dysphoric mood. The patient is nervous/anxious (improved).   All other systems reviewed and are negative.   Filed Vitals:   10/01/15 0800 10/01/15 0817  BP: 142/94 130/90  Pulse: 84   Temp: 97.9 F (36.6 C)   TempSrc: Oral   Height: 5\' 4"  (1.626 m)   Weight: 195 lb 3.2 oz (88.542 kg)   SpO2: 97%    Body mass index is 33.49 kg/(m^2).  Physical Exam  Constitutional: She is oriented to person, place, and time. She appears well-developed and well-nourished.  Neck: Neck supple.  Cardiovascular: Normal rate, regular rhythm and intact distal pulses.  Exam reveals no gallop and no friction rub.   Murmur (1/6 SEM) heard. Pulmonary/Chest: No respiratory distress. She has no wheezes. She has no rales. She exhibits no tenderness.  Lymphadenopathy:    She has no cervical adenopathy.  Neurological: She is alert and oriented to person, place, and time.  Psychiatric: She has a normal mood and affect. Her behavior is normal. Thought content normal.     Labs reviewed: Abstract on 09/14/2015  Component Date Value Ref Range Status  . HM Mammogram 09/13/2015 0-4 Bi-Rad  0-4 Bi-Rad, Self Reported Normal Final   [facility], no mammographic evidence of  malignancy   Appointment on 09/08/2015  Component Date Value Ref Range Status  . Glucose 09/08/2015 69  65 - 99 mg/dL Final  . BUN 09/08/2015 20  8 - 27 mg/dL Final  . Creatinine, Ser 09/08/2015 1.97* 0.57 - 1.00 mg/dL Final  . GFR calc non Af Amer 09/08/2015 25* >59 mL/min/1.73 Final  . GFR calc Af Amer 09/08/2015 29* >59 mL/min/1.73 Final  . BUN/Creatinine Ratio 09/08/2015 10* 12 - 28 Final  . Sodium 09/08/2015 148* 134 - 144 mmol/L Final  .  Potassium 09/08/2015 4.0  3.5 - 5.2 mmol/L Final  . Chloride 09/08/2015 113* 96 - 106 mmol/L Final  . CO2 09/08/2015 21  18 - 29 mmol/L Final  . Calcium 09/08/2015 8.6* 8.7 - 10.3 mg/dL Final  Appointment on 09/03/2015  Component Date Value Ref Range Status  . Glucose 09/03/2015 121* 65 - 99 mg/dL Final  . BUN 09/03/2015 26  8 - 27 mg/dL Final  . Creatinine, Ser 09/03/2015 2.37* 0.57 - 1.00 mg/dL Final  . GFR calc non Af Amer 09/03/2015 20* >59 mL/min/1.73 Final  . GFR calc Af Amer 09/03/2015 23* >59 mL/min/1.73 Final  . BUN/Creatinine Ratio 09/03/2015 11* 12 - 28 Final  . Sodium 09/03/2015 146* 134 - 144 mmol/L Final  . Potassium 09/03/2015 5.6* 3.5 - 5.2 mmol/L Final  . Chloride 09/03/2015 110* 96 - 106 mmol/L Final  . CO2 09/03/2015 22  18 - 29 mmol/L Final  . Calcium 09/03/2015 9.0  8.7 - 10.3 mg/dL Final  Office Visit on 08/27/2015  Component Date Value Ref Range Status  . WBC 08/27/2015 7.7  3.4 - 10.8 x10E3/uL Final  . RBC 08/27/2015 3.91  3.77 - 5.28 x10E6/uL Final  . Hemoglobin 08/27/2015 11.2  11.1 - 15.9 g/dL Final  . Hematocrit 08/27/2015 33.7* 34.0 - 46.6 % Final  . MCV 08/27/2015 86  79 - 97 fL Final  . [facility] 08/27/2015  28.6  26.6 - 33.0 pg Final  . MCHC 08/27/2015 33.2  31.5 - 35.7 g/dL Final  . RDW 08/27/2015 15.4  12.3 - 15.4 % Final  . Platelets 08/27/2015 278  150 - 379 x10E3/uL Final  . Neutrophils 08/27/2015 62   Final  . Lymphs 08/27/2015 27   Final  . Monocytes 08/27/2015 5   Final  . Eos 08/27/2015 5   Final  . Basos 08/27/2015 1   Final  . Neutrophils Absolute 08/27/2015 4.8  1.4 - 7.0 x10E3/uL Final  . Lymphocytes Absolute 08/27/2015 2.1  0.7 - 3.1 x10E3/uL Final  . Monocytes Absolute 08/27/2015 0.4  0.1 - 0.9 x10E3/uL Final  . EOS (ABSOLUTE) 08/27/2015 0.4  0.0 - 0.4 x10E3/uL Final  . Basophils Absolute 08/27/2015 0.1  0.0 - 0.2 x10E3/uL Final  . Immature Granulocytes 08/27/2015 0   Final  . Immature Grans (Abs) 08/27/2015 0.0  0.0 - 0.1 x10E3/uL Final    . Glucose 08/27/2015 88  65 - 99 mg/dL Final  . BUN 08/27/2015 27  8 - 27 mg/dL Final  . Creatinine, Ser 08/27/2015 2.35* 0.57 - 1.00 mg/dL Final  . GFR calc non Af Amer 08/27/2015 21* >59 mL/min/1.73 Final  . GFR calc Af Amer 08/27/2015 24* >59 mL/min/1.73 Final  . BUN/Creatinine Ratio 08/27/2015 11* 12 - 28 Final  . Sodium 08/27/2015 146* 134 - 144 mmol/L Final  . Potassium 08/27/2015 4.2  3.5 - 5.2 mmol/L Final  . Chloride 08/27/2015 109* 96 - 106 mmol/L Final  . CO2 08/27/2015 18  18 - 29 mmol/L Final  . Calcium 08/27/2015 9.4  8.7 - 10.3 mg/dL Final  . Total Protein 08/27/2015 6.1  6.0 - 8.5 g/dL Final  . Albumin 08/27/2015 3.5* 3.6 - 4.8 g/dL Final  . Globulin, Total 08/27/2015 2.6  1.5 - 4.5 g/dL Final  . Albumin/Globulin Ratio 08/27/2015 1.3  1.2 - 2.2 Final  . Bilirubin Total 08/27/2015 0.3  0.0 - 1.2 mg/dL Final  . Alkaline Phosphatase 08/27/2015 145* 39 - 117 IU/L Final  . AST 08/27/2015 16  0 - 40 IU/L Final  . ALT 08/27/2015 20  0 - 32 IU/L Final  . Cholesterol, Total 08/27/2015 270* 100 - 199 mg/dL Final  . Triglycerides 08/27/2015 182* 0 - 149 mg/dL Final  . HDL 08/27/2015 69  >39 mg/dL Final  . VLDL Cholesterol Cal 08/27/2015 36  5 - 40 mg/dL Final  . LDL Calculated 08/27/2015 165* 0 - 99 mg/dL Final  . Chol/HDL Ratio 08/27/2015 3.9  0.0 - 4.4 ratio units Final   Comment:                                   T. Chol/HDL Ratio                                             Men  Women                               1/2 Avg.Risk  3.4    3.3                                   Avg.Risk  5.0    4.4                                2X Avg.Risk  9.6    7.1                                3X Avg.Risk 23.4   11.0   . Hgb A1c MFr Bld 08/27/2015 5.9* 4.8 - 5.6 % Final   Comment:          Pre-diabetes: 5.7 - 6.4          Diabetes: >6.4          Glycemic control for adults with diabetes: <7.0   . Est. average glucose Bld gHb Est-m* 08/27/2015 123   Final    No results  found.   Assessment/Plan   ICD-9-CM ICD-10-CM   1. Hypertension, essential - improved 401.9 I10   2. Insomnia 780.52 G47.00 zolpidem (AMBIEN) 10 MG tablet  3. Depression with anxiety - improved 300.4 F41.8 ALPRAZolam (XANAX) 0.5 MG tablet  4. Type II diabetes mellitus with neurological manifestations ([place]) - controlled 250.60 E11.49    Continue current medications as ordered  Drink plenty of water (at least 4-6 glasses daily) to stay hydrated  Follow up with Orthopedics as scheduled  Follow up in 3 mos for CPE with [name], NP  Cordella Register. Perlie Gold  [facility] and Adult Medicine [address] [place], [place] [address] [phone] Cell (Monday-Friday 8 AM - 5 PM) [phone] After 5 PM and follow prompts

## 2015-10-01 NOTE — Patient Instructions (Addendum)
Continue current medications as ordered  Drink plenty of water (at least 4-6 glasses daily) to stay hydrated  Follow up with Orthopedics as scheduled  Follow up in 3 mos for CPE with [name], NP

## 2015-10-22 ENCOUNTER — Ambulatory Visit: Payer: Self-pay | Admitting: Internal Medicine

## 2015-11-27 ENCOUNTER — Other Ambulatory Visit: Payer: Self-pay | Admitting: Internal Medicine

## 2015-11-27 ENCOUNTER — Other Ambulatory Visit: Payer: Self-pay | Admitting: Nurse Practitioner

## 2015-11-27 DIAGNOSIS — G43009 Migraine without aura, not intractable, without status migrainosus: Secondary | ICD-10-CM

## 2015-12-16 DIAGNOSIS — M1711 Unilateral primary osteoarthritis, right knee: Secondary | ICD-10-CM | POA: Diagnosis not present

## 2015-12-27 ENCOUNTER — Other Ambulatory Visit: Payer: Self-pay | Admitting: Internal Medicine

## 2015-12-27 ENCOUNTER — Other Ambulatory Visit: Payer: Self-pay | Admitting: Nurse Practitioner

## 2015-12-27 DIAGNOSIS — F418 Other specified anxiety disorders: Secondary | ICD-10-CM

## 2015-12-27 DIAGNOSIS — E785 Hyperlipidemia, unspecified: Secondary | ICD-10-CM

## 2016-01-06 ENCOUNTER — Ambulatory Visit (INDEPENDENT_AMBULATORY_CARE_PROVIDER_SITE_OTHER): Payer: Commercial Managed Care - HMO | Admitting: Nurse Practitioner

## 2016-01-06 ENCOUNTER — Other Ambulatory Visit: Payer: Self-pay

## 2016-01-06 ENCOUNTER — Encounter: Payer: Self-pay | Admitting: Nurse Practitioner

## 2016-01-06 VITALS — BP 138/92 | HR 71 | Temp 98.1°F | Resp 18 | Ht 64.0 in | Wt 190.6 lb

## 2016-01-06 DIAGNOSIS — Z720 Tobacco use: Secondary | ICD-10-CM | POA: Diagnosis not present

## 2016-01-06 DIAGNOSIS — R748 Abnormal levels of other serum enzymes: Secondary | ICD-10-CM

## 2016-01-06 DIAGNOSIS — Z0000 Encounter for general adult medical examination without abnormal findings: Secondary | ICD-10-CM

## 2016-01-06 DIAGNOSIS — Z23 Encounter for immunization: Secondary | ICD-10-CM | POA: Diagnosis not present

## 2016-01-06 DIAGNOSIS — R413 Other amnesia: Secondary | ICD-10-CM

## 2016-01-06 DIAGNOSIS — H04123 Dry eye syndrome of bilateral lacrimal glands: Secondary | ICD-10-CM | POA: Diagnosis not present

## 2016-01-06 DIAGNOSIS — H10413 Chronic giant papillary conjunctivitis, bilateral: Secondary | ICD-10-CM | POA: Diagnosis not present

## 2016-01-06 DIAGNOSIS — E1149 Type 2 diabetes mellitus with other diabetic neurological complication: Secondary | ICD-10-CM | POA: Diagnosis not present

## 2016-01-06 DIAGNOSIS — E119 Type 2 diabetes mellitus without complications: Secondary | ICD-10-CM | POA: Diagnosis not present

## 2016-01-06 DIAGNOSIS — R9431 Abnormal electrocardiogram [ECG] [EKG]: Secondary | ICD-10-CM

## 2016-01-06 DIAGNOSIS — H26491 Other secondary cataract, right eye: Secondary | ICD-10-CM | POA: Diagnosis not present

## 2016-01-06 DIAGNOSIS — F418 Other specified anxiety disorders: Secondary | ICD-10-CM

## 2016-01-06 DIAGNOSIS — Z961 Presence of intraocular lens: Secondary | ICD-10-CM | POA: Diagnosis not present

## 2016-01-06 DIAGNOSIS — I10 Essential (primary) hypertension: Secondary | ICD-10-CM | POA: Diagnosis not present

## 2016-01-06 DIAGNOSIS — H401132 Primary open-angle glaucoma, bilateral, moderate stage: Secondary | ICD-10-CM | POA: Diagnosis not present

## 2016-01-06 DIAGNOSIS — E785 Hyperlipidemia, unspecified: Secondary | ICD-10-CM | POA: Diagnosis not present

## 2016-01-06 DIAGNOSIS — Z794 Long term (current) use of insulin: Secondary | ICD-10-CM | POA: Diagnosis not present

## 2016-01-06 DIAGNOSIS — E559 Vitamin D deficiency, unspecified: Secondary | ICD-10-CM | POA: Diagnosis not present

## 2016-01-06 DIAGNOSIS — F17200 Nicotine dependence, unspecified, uncomplicated: Secondary | ICD-10-CM

## 2016-01-06 LAB — COMPLETE METABOLIC PANEL WITH GFR
ALT: 17 U/L (ref 6–29)
AST: 15 U/L (ref 10–35)
Albumin: 3.2 g/dL — ABNORMAL LOW (ref 3.6–5.1)
Alkaline Phosphatase: 130 U/L (ref 33–130)
BILIRUBIN TOTAL: 0.5 mg/dL (ref 0.2–1.2)
BUN: 23 mg/dL (ref 7–25)
CALCIUM: 8.8 mg/dL (ref 8.6–10.4)
CO2: 22 mmol/L (ref 20–31)
CREATININE: 2.73 mg/dL — AB (ref 0.50–0.99)
Chloride: 107 mmol/L (ref 98–110)
GFR, EST AFRICAN AMERICAN: 20 mL/min — AB (ref >=60)
GFR, EST NON AFRICAN AMERICAN: 17 mL/min — AB (ref >=60)
Glucose, Bld: 41 mg/dL — ABNORMAL LOW (ref 65–99)
Potassium: 4.3 mmol/L (ref 3.5–5.3)
Sodium: 138 mmol/L (ref 135–146)
TOTAL PROTEIN: 6.1 g/dL (ref 6.1–8.1)

## 2016-01-06 LAB — LIPID PANEL
CHOL/HDL RATIO: 3.4 ratio (ref <=5.0)
Cholesterol: 239 mg/dL — ABNORMAL HIGH (ref 125–200)
HDL: 71 mg/dL (ref >=46)
LDL Cholesterol: 137 mg/dL — ABNORMAL HIGH (ref <130)
Triglycerides: 155 mg/dL — ABNORMAL HIGH (ref <150)
VLDL: 31 mg/dL — AB (ref <30)

## 2016-01-06 LAB — TSH: TSH: 1.41 mIU/L

## 2016-01-06 LAB — CBC WITH DIFFERENTIAL/PLATELET
Basophils Absolute: 0 cells/uL (ref 0–200)
Basophils Relative: 0 %
Eosinophils Absolute: 498 cells/uL (ref 15–500)
Eosinophils Relative: 6 %
HCT: 30.5 % — ABNORMAL LOW (ref 35.0–45.0)
Hemoglobin: 10 g/dL — ABNORMAL LOW (ref 11.7–15.5)
Lymphocytes Relative: 41 %
Lymphs Abs: 3403 cells/uL (ref 850–3900)
MCH: 28.7 pg (ref 27.0–33.0)
MCHC: 32.8 g/dL (ref 32.0–36.0)
MCV: 87.4 fL (ref 80.0–100.0)
MPV: 10.3 fL (ref 7.5–12.5)
Monocytes Absolute: 581 cells/uL (ref 200–950)
Monocytes Relative: 7 %
Neutro Abs: 3818 cells/uL (ref 1500–7800)
Neutrophils Relative %: 46 %
Platelets: 295 10*3/uL (ref 140–400)
RBC: 3.49 MIL/uL — ABNORMAL LOW (ref 3.80–5.10)
RDW: 15.4 % — ABNORMAL HIGH (ref 11.0–15.0)
WBC: 8.3 10*3/uL (ref 3.8–10.8)

## 2016-01-06 LAB — HEMOGLOBIN A1C
Hgb A1c MFr Bld: 5.5 % (ref <5.7)
Mean Plasma Glucose: 111 mg/dL

## 2016-01-06 LAB — HM DIABETES EYE EXAM

## 2016-01-06 NOTE — Progress Notes (Signed)
Provider: [name], NP  Patient Care Team: [name], NP as PCP - General (Nurse Practitioner) Woodroe Mode (Inactive) as Diabetes Educator [name], MD as Consulting Physician (Ophthalmology)  Extended Emergency Contact Information Primary Emergency Contact: Chassell Address: [address], [place] Phone: [phone] Relation: Mother Allergies  Allergen Reactions  . Morphine And Related     nauseated  . Pioglitazone     REACTION: Desquamation of skin of the palm  . Sulfonamide Derivatives     rash   Goals of Care: Advanced Directive information Advanced Directives 01/06/2016  Does patient have an advance directive? No  Does patient want to make changes to advanced directive? -  Would patient like information on creating an advanced directive? Yes - Environmental education officer Complaint  Patient presents with  . Medical Management of Chronic Issues    Complete physical. 6CIT Dementia Test Score: 12 (significant cognitive impairment)  . Other    Wants flu vaccine. Needs dermatology referral.   . other    Failed MMSE clock drawing.    HPI: Patient is a 70 y.o. female seen in today for an annual wellness exam.   Fasting today Major illnesses or hospitalization in the last year: none  Depression screen [facility] 2/9 01/06/2016 01/06/2016 08/27/2015 09/07/2014 07/09/2014  Decreased Interest - 0 1 0 0  Down, Depressed, Hopeless _0 0 1  PHQ - 2 Score _1 0 1  Altered sleeping _2 - -  Tired, decreased energy _3 - -  Change in appetite 0 0 2 - -  Feeling bad or failure about yourself  1 0 1 - -  Trouble concentrating 0 1 2 - -  Moving slowly or fidgety/restless 0 1 0 - -  Suicidal thoughts 0 1 3 - -  PHQ-9 Score _4 - -  Difficult doing work/chores - Very difficult Somewhat difficult - -  Some recent data might be hidden    Fall Risk  01/06/2016 01/06/2016 07/09/2015  06/24/2015 06/03/2015  Falls in the past year? Yes No No Yes No  Number falls in past yr: 1 - - 1 -  Injury with Fall? No - - No -  Risk Factor Category  - - - - -  Risk for fall due to : - - - - -  Risk for fall due to (comments): - - - - -  Follow up - - - - -  reports dog pulled her down, 3 days ago, no injury.    MMSE - Mini Mental State Exam 01/06/2016  Orientation to time 4  Orientation to Place 5  Registration 3  Attention/ Calculation 5  Recall 2  Language- name 2 objects 2  Language- repeat 1  Language- follow 3 step command 2  Language- read & follow direction 1  Write a sentence 1  Copy design 0  Total score 26     Health Maintenance  Topic Date Due  . INFLUENZA VACCINE  11/23/2015  . HEMOGLOBIN A1C  11/27/2015  . FOOT EXAM  01/15/2016  . LIPID PANEL  08/26/2016  . OPHTHALMOLOGY EXAM  01/05/2017  . MAMMOGRAM  09/12/2017  . TETANUS/TDAP  11/24/2018  . COLONOSCOPY  10/16/2020  . DEXA SCAN  Completed  . ZOSTAVAX  Completed  . Hepatitis C Screening  Completed  . PNA  vac Low Risk Adult  Completed    Urinary incontinence?occasionally will leak Functional Status Survey: Is the patient deaf or have difficulty hearing?: No Does the patient have difficulty seeing, even when wearing glasses/contacts?: No Does the patient have difficulty concentrating, remembering, or making decisions?: Yes Does the patient have difficulty walking or climbing stairs?: No Does the patient have difficulty dressing or bathing?: No Does the patient have difficulty doing errands alone such as visiting a doctor's office or shopping?: No Current Exercise Habits: Home exercise routine, Type of exercise: walking, Time (Minutes): 30, Frequency (Times/Week): 7, Weekly Exercise (Minutes/Week): 210, Intensity: Moderate   Diet?diabetic diet Vision Screening Comments: Had eye appointment on 01/06/16 with Dr. [name] Fitch.    Dentition: poor  Pain: none today, occasional pain in right knee and hip  Past  Medical History:  Diagnosis Date  . Anxiety   . Arthritis    lower back and knees   . Asthma    childhood  . Barrett's esophagus   . Depression   . Diabetes mellitus   . Dysphagia   . Eczema   . History of herpes zoster 02/2010   Recovered fully after period of acute herpetic neuralgia tx w/ gabapentin.   Marland Kitchen Hx MRSA infection 2009  . Hyperlipidemia   . Hypertension   . Neuromuscular disorder (HCC)    chronic pain  . Personal history of colonic polyps 10/17/2010   hyperplastic    Past Surgical History:  Procedure Laterality Date  . ABDOMINAL HYSTERECTOMY     unclear when  . CATARACT EXTRACTION Bilateral   . thigh surg     left side due to MRSA    Social History   Social History  . Marital status: Divorced    Spouse name: N/A  . Number of children: 0  . Years of education: N/A   Occupational History  . Retired [employer]   Social History Main Topics  . Smoking status: Current Every Day Smoker    Packs/day: 0.25    Years: 40.00    Types: Cigarettes  . Smokeless tobacco: Never Used     Comment: smokes 1 pack of cigerettes a week- off and on  . Alcohol use 1.2 oz/week    2 Standard drinks or equivalent per week     Comment: wine and beer- occasionally   . Drug use: No  . Sexual activity: No   Other Topics Concern  . None   Social History Narrative   Financial assistance approved for 100% discount at [facility] and has [facility] card per [employer]   02/10/2010      3 caffeine drinks daily     Family History  Problem Relation Age of Onset  . Hypertension Father   . Diabetes Brother   . Colon cancer Neg Hx     Review of Systems:  Review of Systems  Constitutional: Negative for activity change, appetite change, fatigue and fever.  HENT: Negative for congestion, rhinorrhea and sinus pressure.   Eyes: Negative for pain, redness and visual disturbance.  Respiratory: Negative for cough and shortness of breath.   Cardiovascular: Negative for chest pain,  palpitations and leg swelling.  Gastrointestinal: Negative for abdominal distention and constipation.  Genitourinary: Negative for difficulty urinating.  Musculoskeletal: Positive for arthralgias (knees and back pain).       Pain controlled on medication  Allergic/Immunologic: Negative for environmental allergies.  Neurological: Positive for headaches. Negative for dizziness, tremors, seizures, syncope, facial asymmetry, speech difficulty, weakness, light-headedness and numbness.  Psychiatric/Behavioral: Positive for agitation, confusion, dysphoric mood and sleep disturbance.       Increase depression, brother gets on her nerves       Medication List       Accurate as of 01/06/16 11:18 AM. Always use your most recent med list.          ALPRAZolam 0.5 MG tablet Commonly known as:  XANAX take 1 tablet by mouth twice a day if needed for anxiety   AMBULATORY NON FORMULARY MEDICATION Trumetrix Lancets 33G Use to test blood sugar Twice daily. Dx: E11.49   AMBULATORY NON FORMULARY MEDICATION Trumetrix Test Strips Use to test blood sugar twice daily. Dx E11.49   amLODipine 5 MG tablet Commonly known as:  NORVASC Take 1 tablet (5 mg total) by mouth daily.   aspirin EC 81 MG tablet Take 1 tablet (81 mg total) by mouth daily.   atenolol 25 MG tablet Commonly known as:  TENORMIN take 1 tablet by mouth once daily for MIGRAINE HEADACHE   clobetasol cream 0.05 % Commonly known as:  TEMOVATE Apply 1 application topically at bedtime.   diclofenac sodium 1 % Gel Commonly known as:  VOLTAREN Apply 4 g topically 4 (four) times daily as needed.   glucose blood test strip Use as instructed   hydrOXYzine 10 MG tablet Commonly known as:  ATARAX/VISTARIL Take 10 mg by mouth every 6 (six) hours as needed for itching.   Insulin Lispro Prot & Lispro (75-25) 100 UNIT/ML Kwikpen Commonly known as:  HUMALOG MIX 75/25 KWIKPEN Inject 15 Units into the skin 2 (two) times daily.   INSULIN  SYRINGE .5CC/31GX5/16" 31G X 5/16" 0.5 ML Misc 1 each by Does not apply route 2 (two) times daily before a meal.   latanoprost 0.005 % ophthalmic solution Commonly known as:  XALATAN Place 1 drop into both eyes daily. Reported on 08/27/2015   lisinopril 40 MG tablet Commonly known as:  PRINIVIL,ZESTRIL Take one tablet by mouth once daily for blood pressure   loratadine 10 MG tablet Commonly known as:  CLARITIN Take 10 mg by mouth as needed.   omeprazole 40 MG capsule Commonly known as:  PRILOSEC take 1 capsule by mouth once daily   onetouch ultrasoft lancets Use as instructed   PARoxetine 40 MG tablet Commonly known as:  PAXIL Take 1 tablet (40 mg total) by mouth daily.   pravastatin 40 MG tablet Commonly known as:  PRAVACHOL take 1 tablet by mouth once daily   sodium polystyrene 15 GM/60ML suspension Commonly known as:  KAYEXALATE Take 120 mLs (30 g total) by mouth 2 (two) times daily. For elevated potassium   SUMAtriptan 25 MG tablet Commonly known as:  IMITREX 1 tablet daily as needed for headache, May repeat in 2 hours if headache persists or recurs. Max of 2 tablets in 24 hours   traMADol 50 MG tablet Commonly known as:  ULTRAM 1 by mouth every 6 hours as needed   triamcinolone cream 0.1 % Commonly known as:  KENALOG Apply to affected area 2-3 times daily   zolpidem 10 MG tablet Commonly known as:  AMBIEN Take 10 mg by mouth at bedtime as needed for sleep.         Physical Exam: Vitals:   01/06/16 1057  BP: (!) 138/92  Pulse: 71  Resp: 18  Temp: 98.1 F (36.7 C)  TempSrc: Oral  SpO2: 94%  Weight: 190 lb 9.6 oz (86.5 kg)  Height: 5\' 4"  (1.626 m)   Body  mass index is 32.72 kg/m. Physical Exam  Constitutional: She is oriented to person, place, and time. She appears well-developed and well-nourished.  Eyes:  Eyes have been dilated at ophthalmologist, where she was prior to this appt   Neck: Neck supple.  Cardiovascular: Normal rate, regular  rhythm and intact distal pulses.  Exam reveals no gallop and no friction rub.   Murmur (1/6 SEM) heard. Pulmonary/Chest: No respiratory distress. She has no wheezes. She has no rales. She exhibits no tenderness.  Abdominal: Soft. Bowel sounds are normal. She exhibits no distension.  Musculoskeletal: Normal range of motion. She exhibits no edema or tenderness.  Lymphadenopathy:    She has no cervical adenopathy.  Neurological: She is alert and oriented to person, place, and time. She has normal reflexes. No cranial nerve deficit. Coordination normal.  Skin: Skin is warm and dry.  Psychiatric: She has a normal mood and affect. Her behavior is normal. Thought content normal. Cognition and memory are impaired.    Labs reviewed: Basic Metabolic Panel:  Recent Labs  08/27/15 0937 09/03/15 0917 09/08/15 0859  NA 146* 146* 148*  K 4.2 5.6* 4.0  CL 109* 110* 113*  CO2 _0 GLUCOSE 88 121* 69  BUN _1 CREATININE 2.35* 2.37* 1.97*  CALCIUM 9.4 9.0 8.6*   Liver Function Tests:  Recent Labs  01/15/15 1534 04/22/15 1108 08/27/15 0937  AST _2 ALT _3 ALKPHOS 144* 243* 145*  BILITOT 0.2 0.3 0.3  PROT 6.4 5.7* 6.1  ALBUMIN 3.8 3.2* 3.5*   No results for input(s): LIPASE, AMYLASE in the last 8760 hours. No results for input(s): AMMONIA in the last 8760 hours. CBC:  Recent Labs  04/22/15 1108 08/27/15 0937  WBC 10.9* 7.7  NEUTROABS 7.5* 4.8  HCT 32.6* 33.7*  MCV 86 86  PLT 287 278   Lipid Panel:  Recent Labs  01/15/15 1534 08/27/15 0937  CHOL 231* 270*  HDL 52 69  LDLCALC Comment 165*  TRIG 417* 182*  CHOLHDL 4.4 3.9   Lab Results  Component Value Date   HGBA1C 5.9 (H) 08/27/2015    Procedures: No results found.  Assessment/Plan 1. Medicare annual wellness visit, subsequent Pt living with brother, reports he gets on her nerves a lot and this is the cause of most of her mood disorder. Admits to depression. No SI or HI, plan to see  psychiatrist  MMSE 26/30, indication memory loss, labs and CT for workup of this  Discussed need to go to dentist due to poor dentition  Changes on EKG noted therefore will refer to cardiology  2. Memory deficit -pt notes she has trouble remembering and short term memory is worse.  -will follow up labs to rule out abnormalities causing memory loss, also will get CT of head to evaluate  -possible cause from depression. Pt is agreeable to see psychiatrist at this time - CMP with eGFR - CBC with Differential/Platelets - TSH - CT Head Wo Contrast; Future - RPR  3. Depression with anxiety Worsening depression and anxiety.  conts paxil but not effective at this time.  Agreeable to see psychiatrist at this time and pt will call to set up appt - TSH - CT Head Wo Contrast; Future - Vitamin D, 25-hydroxy  4. Hyperlipidemia -conts on pravachal - Lipid panel  5. Hypertension, essential blood pressure not at goal, reports she has taken medication today -encouraged lifestyle modifications. To record BP and follow up  next visit with readings  - CMP with eGFR - CBC with Differential/Platelets  6. 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 Elevated alkaline phosphatase level -elevated alkaline phos on previous labs will follow up at this time - Alkaline Phosphatase, Isoenzymes - Gamma GT  8. T wave inversion in EKG -reports chest pains when her brother "gets on her nerves" however with changes in EKG from previous taken 1 year ago- T wave inversion noted on leads V3-6 - Ambulatory referral to Cardiology at this time   9. Smoker -encouraged smoking cessation.  -will get screening lung CT at this time   Total time 45 mins  time greater than 50% of total time spent doing pt counseling on multiple comorbities and on plan  of care   Next appt: 2 weeks

## 2016-01-06 NOTE — Patient Instructions (Addendum)
Dr Casimiro Needle  [facility] [place],  [id number] [address]: [phone]   To take blood pressure at home daily after medication and bring reading to next office visit

## 2016-01-07 ENCOUNTER — Telehealth: Payer: Self-pay

## 2016-01-07 LAB — VITAMIN D 25 HYDROXY (VIT D DEFICIENCY, FRACTURES): VIT D 25 HYDROXY: 21 ng/mL — AB (ref 30–100)

## 2016-01-07 LAB — RPR

## 2016-01-07 LAB — GAMMA GT: GGT: 220 U/L — ABNORMAL HIGH (ref 7–51)

## 2016-01-07 MED ORDER — INSULIN LISPRO PROT & LISPRO (75-25 MIX) 100 UNIT/ML KWIKPEN: 8.0000 [IU] | PEN_INJECTOR | Freq: Two times a day (BID) | SUBCUTANEOUS | 1 refills | Status: DC

## 2016-01-07 MED ORDER — AMLODIPINE BESYLATE 10 MG PO TABS: 10.0000 mg | ORAL_TABLET | Freq: Every day | ORAL | 3 refills | Status: DC

## 2016-01-07 MED ORDER — VITAMIN D (ERGOCALCIFEROL) 1.25 MG (50000 UNIT) PO CAPS: 50000.0000 [IU] | ORAL_CAPSULE | ORAL | 0 refills | Status: DC

## 2016-01-07 NOTE — Telephone Encounter (Signed)
-----   Message from [name], NP sent at 01/07/2016  6:52 AM EDT ----- Based on labs these are the following changes:  Blood sugars are TOO low! Decrease humalog mix insulin to 8 units twice daily. To record blood sugars and bring to follow up visit. (we can always go back up if blood sugars start to get high but for now they are too low) Make sure you are getting adequate nutrition. 3 meals a days with proper proteins  Start Vit D 50,000 units by mouth WEEKLY for low vit D level (please send to pharmacy of choice # 12/0 Kidney function is elevated-- need to drink more water at least 6 8 oz glasses a day and to avoid NSAIDS (Aleve, Advil, Motrin, Ibuprofen)   Stop lisinopril Increase Norvasc to 10 mg by mouth daily for blood pressure (take 2 of the 5 mg tablets)   Please make sure pt understands all of these directions (may want to tell her brother or family member as well) and have her make a follow up visit with me next Thursday instead of waiting 2 weeks as previously discussed

## 2016-01-07 NOTE — Telephone Encounter (Signed)
Discussed results with patient, patient verbalized understanding of results. Follow-up appointment scheduled for next Thursday at 10:45 (patient requested morning appointment vs afternoon)  3 rx's sent to the pharmacy 1.) Amlodipine 10 mg daily 2.) Humalog 8 units BID 3.) Vit d 50,000 units once weekly   D/C'ed Lisinopril from medication list  I called patient's brother Iona Beard @ [phone], was unable to leave a message, I will try again later

## 2016-01-11 LAB — ALKALINE PHOSPHATASE ISOENZYMES
ALKALINE PHOSPHATASE (ALP ISO): 154 U/L — AB (ref 33–130)
BONE ISOENZYMES (ALP ISO): 7 % — AB (ref 28–66)
INTESTINAL ISOENZYMES (ALP ISO): 0 % — AB (ref 1–24)
LIVER ISOENZYMES (ALP ISO): 61 % (ref 25–69)
MACROHEPATIC ISOENZYMES: 32 % — AB

## 2016-01-13 ENCOUNTER — Encounter: Payer: Self-pay | Admitting: Nurse Practitioner

## 2016-01-13 ENCOUNTER — Ambulatory Visit (INDEPENDENT_AMBULATORY_CARE_PROVIDER_SITE_OTHER): Payer: Commercial Managed Care - HMO | Admitting: Nurse Practitioner

## 2016-01-13 VITALS — BP 128/86 | HR 75 | Temp 97.9°F | Resp 18 | Ht 64.0 in | Wt 193.8 lb

## 2016-01-13 DIAGNOSIS — F418 Other specified anxiety disorders: Secondary | ICD-10-CM | POA: Diagnosis not present

## 2016-01-13 DIAGNOSIS — R748 Abnormal levels of other serum enzymes: Secondary | ICD-10-CM | POA: Diagnosis not present

## 2016-01-13 DIAGNOSIS — N184 Chronic kidney disease, stage 4 (severe): Secondary | ICD-10-CM | POA: Diagnosis not present

## 2016-01-13 DIAGNOSIS — G4700 Insomnia, unspecified: Secondary | ICD-10-CM

## 2016-01-13 DIAGNOSIS — Z794 Long term (current) use of insulin: Secondary | ICD-10-CM | POA: Diagnosis not present

## 2016-01-13 DIAGNOSIS — E785 Hyperlipidemia, unspecified: Secondary | ICD-10-CM

## 2016-01-13 DIAGNOSIS — E1149 Type 2 diabetes mellitus with other diabetic neurological complication: Secondary | ICD-10-CM | POA: Diagnosis not present

## 2016-01-13 DIAGNOSIS — I10 Essential (primary) hypertension: Secondary | ICD-10-CM | POA: Diagnosis not present

## 2016-01-13 DIAGNOSIS — R413 Other amnesia: Secondary | ICD-10-CM | POA: Diagnosis not present

## 2016-01-13 LAB — COMPLETE METABOLIC PANEL WITH GFR
ALBUMIN: 3.4 g/dL — AB (ref 3.6–5.1)
ALK PHOS: 122 U/L (ref 33–130)
ALT: 14 U/L (ref 6–29)
AST: 13 U/L (ref 10–35)
BILIRUBIN TOTAL: 0.3 mg/dL (ref 0.2–1.2)
BUN: 28 mg/dL — ABNORMAL HIGH (ref 7–25)
CALCIUM: 8.6 mg/dL (ref 8.6–10.4)
CO2: 21 mmol/L (ref 20–31)
CREATININE: 2.89 mg/dL — AB (ref 0.50–0.99)
Chloride: 111 mmol/L — ABNORMAL HIGH (ref 98–110)
GFR, EST AFRICAN AMERICAN: 18 mL/min — AB (ref >=60)
GFR, EST NON AFRICAN AMERICAN: 16 mL/min — AB (ref >=60)
Glucose, Bld: 105 mg/dL — ABNORMAL HIGH (ref 65–99)
Potassium: 4.8 mmol/L (ref 3.5–5.3)
Sodium: 141 mmol/L (ref 135–146)
TOTAL PROTEIN: 5.9 g/dL — AB (ref 6.1–8.1)

## 2016-01-13 MED ORDER — ZOLPIDEM TARTRATE 5 MG PO TABS: 5.0000 mg | ORAL_TABLET | Freq: Every evening | ORAL | 2 refills | Status: DC | PRN

## 2016-01-13 NOTE — Progress Notes (Signed)
Careteam: Patient Care Team: [name], NP as PCP - General (Nurse Practitioner) Woodroe Mode (Inactive) as Diabetes Educator [name], MD as Consulting Physician (Ophthalmology)  Advanced Directive information Does patient have an advance directive?: No  Allergies  Allergen Reactions  . Lisinopril Other (See Comments)    Abnormal Kidney Function   . Morphine And Related     nauseated  . Pioglitazone     REACTION: Desquamation of skin of the palm  . Sulfonamide Derivatives     rash    Chief Complaint  Patient presents with  . Medical Management of Chronic Issues    1 week follow up for blood sugars. Pt brought log of blood sugar     HPI: Patient is a 70 y.o. female seen in the office today to follow up blood work. pts having memory deficits and work up has been started. She is here alone today.  Does know that she has to go get imaging done next week. Pt with abnormal EKG during EV last week- cardiology has been set up.  On lab result. Showed elevation in BUN/Cr- she stopped lisinopril and increased norvasc from 71m to 10 mg sbp in the 120s at home unless shes "hot" states she has anger issue. Has made appt with psychiatrist.  Glucose 40 on BMP, cut back to 8 units from 10 units humalog mix.  Blood sugars now 114-138 fasting  Reports she did not feel bad when her blood sugar was low.   Vit D was low- added Vit D 50000 units weekly- she has picked this up and started taking it.   Not taking any NSAIDs, has increased her water intake.   Alk phos has been elevated- pt drinks wine occasionally, had a bottle of wine 3 weeks ago and has not drank since.  No abdominal pain or discomfort, no changes to bowels   Review of Systems:  Review of Systems  Constitutional: Negative for activity change, appetite change, fatigue and fever.  Eyes: Negative for pain, redness and visual disturbance.  Respiratory: Negative for cough and shortness of breath.     Cardiovascular: Negative for chest pain, palpitations and leg swelling.  Gastrointestinal: Negative for abdominal distention, abdominal pain, anal bleeding, blood in stool, constipation, diarrhea, nausea and rectal pain.  Genitourinary: Negative for difficulty urinating.  Musculoskeletal: Positive for arthralgias.       Pain controlled on medication  Allergic/Immunologic: Negative for environmental allergies.  Neurological: Negative for dizziness, tremors, seizures, syncope, facial asymmetry, speech difficulty, weakness, light-headedness and numbness.  Psychiatric/Behavioral: Positive for agitation, confusion, dysphoric mood and sleep disturbance.       Increase depression, brother gets on her nerves     Past Medical History:  Diagnosis Date  . Anxiety   . Arthritis    lower back and knees   . Asthma    childhood  . Barrett's esophagus   . Depression   . Diabetes mellitus   . Dysphagia   . Eczema   . History of herpes zoster 02/2010   Recovered fully after period of acute herpetic neuralgia tx w/ gabapentin.   .Marland KitchenHx MRSA infection 2009  . Hyperlipidemia   . Hypertension   . Neuromuscular disorder (HCC)    chronic pain  . Personal history of colonic polyps 10/17/2010   hyperplastic   Past Surgical History:  Procedure Laterality Date  . ABDOMINAL HYSTERECTOMY     unclear when  . CATARACT EXTRACTION Bilateral   . thigh surg  left side due to MRSA   Social History:   reports that she has been smoking Cigarettes.  She has a 10.00 pack-year smoking history. She has never used smokeless tobacco. She reports that she drinks about 1.2 oz of alcohol per week . She reports that she does not use drugs.  Family History  Problem Relation Age of Onset  . Hypertension Father   . Diabetes Brother   . Colon cancer Neg Hx     Medications: Patient's Medications  New Prescriptions   No medications on file  Previous Medications   ALPRAZOLAM (XANAX) 0.5 MG TABLET    take 1 tablet  by mouth twice a day if needed for anxiety   AMBULATORY NON FORMULARY MEDICATION    Trumetrix Lancets 33G Use to test blood sugar Twice daily. Dx: E11.49   AMBULATORY NON FORMULARY MEDICATION    Trumetrix Test Strips Use to test blood sugar twice daily. Dx E11.49   AMLODIPINE (NORVASC) 10 MG TABLET    Take 1 tablet (10 mg total) by mouth daily.   ASPIRIN EC 81 MG TABLET    Take 1 tablet (81 mg total) by mouth daily.   ATENOLOL (TENORMIN) 25 MG TABLET    take 1 tablet by mouth once daily for MIGRAINE HEADACHE   CLOBETASOL CREAM (TEMOVATE) 0.05 %    Apply 1 application topically at bedtime.    DICLOFENAC SODIUM (VOLTAREN) 1 % GEL    Apply 4 g topically 4 (four) times daily as needed.   GLUCOSE BLOOD TEST STRIP    Use as instructed   HYDROXYZINE (ATARAX/VISTARIL) 10 MG TABLET    Take 10 mg by mouth every 6 (six) hours as needed for itching.   INSULIN LISPRO PROT & LISPRO (HUMALOG MIX 75/25 KWIKPEN) (75-25) 100 UNIT/ML KWIKPEN    Inject 8 Units into the skin 2 (two) times daily. DX E11.40   INSULIN SYRINGE-NEEDLE U-100 (INSULIN SYRINGE .5CC/31GX5/16") 31G X 5/16" 0.5 ML MISC    1 each by Does not apply route 2 (two) times daily before a meal.   LANCETS (ONETOUCH ULTRASOFT) LANCETS    Use as instructed   LATANOPROST (XALATAN) 0.005 % OPHTHALMIC SOLUTION    Place 1 drop into both eyes daily. Reported on 08/27/2015   LORATADINE (CLARITIN) 10 MG TABLET    Take 10 mg by mouth as needed.   OMEPRAZOLE (PRILOSEC) 40 MG CAPSULE    take 1 capsule by mouth once daily   PAROXETINE (PAXIL) 40 MG TABLET    Take 1 tablet (40 mg total) by mouth daily.   PRAVASTATIN (PRAVACHOL) 40 MG TABLET    take 1 tablet by mouth once daily   SODIUM POLYSTYRENE (KAYEXALATE) 15 GM/60ML SUSPENSION    Take 120 mLs (30 g total) by mouth 2 (two) times daily. For elevated potassium   SUMATRIPTAN (IMITREX) 25 MG TABLET    1 tablet daily as needed for headache, May repeat in 2 hours if headache persists or recurs. Max of 2 tablets in 24  hours   TRAMADOL (ULTRAM) 50 MG TABLET    1 by mouth every 6 hours as needed   TRIAMCINOLONE CREAM (KENALOG) 0.1 %    Apply to affected area 2-3 times daily   VITAMIN D, ERGOCALCIFEROL, (DRISDOL) 50000 UNITS CAPS CAPSULE    Take 1 capsule (50,000 Units total) by mouth every 7 (seven) days.   ZOLPIDEM (AMBIEN) 10 MG TABLET    Take 10 mg by mouth at bedtime as needed for sleep.  Modified Medications   No medications on file  Discontinued Medications   No medications on file     Physical Exam:  There were no vitals filed for this visit. There is no height or weight on file to calculate BMI.  Physical Exam  Constitutional: She is oriented to person, place, and time. She appears well-developed and well-nourished.  Neck: Neck supple.  Cardiovascular: Normal rate, regular rhythm and intact distal pulses.  Exam reveals no gallop and no friction rub.   Murmur (1/6 SEM) heard. Pulmonary/Chest: Effort normal and breath sounds normal. No respiratory distress. She has no wheezes. She has no rales. She exhibits no tenderness.  Abdominal: Soft. Bowel sounds are normal. She exhibits no distension and no mass. There is no tenderness. There is no rebound and no guarding.  Lymphadenopathy:    She has no cervical adenopathy.  Neurological: She is alert and oriented to person, place, and time.  Skin: Skin is warm and dry.  Psychiatric: She has a normal mood and affect. Her behavior is normal. Thought content normal.    Labs reviewed: Basic Metabolic Panel:  Recent Labs  09/03/15 0917 09/08/15 0859 01/06/16 1207  NA 146* 148* 138  K 5.6* 4.0 4.3  CL 110* 113* 107  CO2 22 21 22   GLUCOSE 121* 69 41*  BUN 26 20 23   CREATININE 2.37* 1.97* 2.73*  CALCIUM 9.0 8.6* 8.8  TSH  --   --  1.41  Estimated Creatinine Clearance: 20.9 mL/min (by C-G formula based on SCr of 2.73 mg/dL (H)).  Liver Function Tests:  Recent Labs  04/22/15 1108 08/27/15 0937 01/06/16 1207  AST 21 16 15   ALT 30 20 17     ALKPHOS 243* 145* 130  BILITOT 0.3 0.3 0.5  PROT 5.7* 6.1 6.1  ALBUMIN 3.2* 3.5* 3.2*   No results for input(s): LIPASE, AMYLASE in the last 8760 hours. No results for input(s): AMMONIA in the last 8760 hours. CBC:  Recent Labs  04/22/15 1108 08/27/15 0937 01/06/16 1207  WBC 10.9* 7.7 8.3  NEUTROABS 7.5* 4.8 3,818  HGB  --   --  10.0*  HCT 32.6* 33.7* 30.5*  MCV 86 86 87.4  PLT 287 278 295   Lipid Panel:  Recent Labs  01/15/15 1534 08/27/15 0937 01/06/16 1207  CHOL 231* 270* 239*  HDL 52 69 71  LDLCALC Comment 165* 137*  TRIG 417* 182* 155*  CHOLHDL 4.4 3.9 3.4   TSH:  Recent Labs  01/06/16 1207  TSH 1.41   A1C: Lab Results  Component Value Date   HGBA1C 5.5 01/06/2016     Assessment/Plan 1. Memory deficit Will obtain CT to evaluate, could also be due to depression   2. Depression with anxiety Has appt with psych scheduled. For now conts on paxil and xanax as needed   3. Hyperlipidemia -elevated LDL, conts on pravastatin and diet modifications    4. Hypertension, essential Stable off lisinopril.  conts on norvasc 10 mg daily and metoprolol   5. 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 To cont on humalog mix 8 units twice daily. To cont to take a record blood sugars.   6. Elevated alkaline phosphatase level -with elevation of ggt, no abdominal pain or discomfort. Will  - US Abdomen Complete; Future - CMP with eGFR  7. Insomnia - zolpidem (AMBIEN) 5 MG tablet;  Take 1 tablet (5 mg total) by mouth at bedtime as needed for sleep.  Dispense: 30 tablet; Refill: 2  8. CKD -worsening renal function on last labs, avoiding NSAID and increased hydration Off ACE Will follow up [place]. Harle Battiest  [facility] [phone] 8 am - 5  pm) [phone] (after hours)

## 2016-01-13 NOTE — Patient Instructions (Signed)
Cont to take blood sugars and write down To notify us if blood sugars staying below 100- or if you have hypoglycemic episode  We will get ultrasound of your liver to evaluate elevated lab work

## 2016-01-17 ENCOUNTER — Other Ambulatory Visit: Payer: Self-pay

## 2016-01-17 DIAGNOSIS — N289 Disorder of kidney and ureter, unspecified: Secondary | ICD-10-CM

## 2016-01-19 ENCOUNTER — Ambulatory Visit
Admission: RE | Admit: 2016-01-19 | Discharge: 2016-01-19 | Disposition: A | Discharge status: Home / Self Care | Payer: Commercial Managed Care - HMO | Source: Ambulatory Visit | Attending: Nurse Practitioner | Admitting: Nurse Practitioner

## 2016-01-19 DIAGNOSIS — Z87891 Personal history of nicotine dependence: Secondary | ICD-10-CM | POA: Diagnosis not present

## 2016-01-19 DIAGNOSIS — R51 Headache: Secondary | ICD-10-CM | POA: Diagnosis not present

## 2016-01-19 DIAGNOSIS — F17200 Nicotine dependence, unspecified, uncomplicated: Secondary | ICD-10-CM

## 2016-01-19 DIAGNOSIS — F418 Other specified anxiety disorders: Secondary | ICD-10-CM

## 2016-01-19 DIAGNOSIS — R413 Other amnesia: Secondary | ICD-10-CM

## 2016-01-20 DIAGNOSIS — H401132 Primary open-angle glaucoma, bilateral, moderate stage: Secondary | ICD-10-CM | POA: Diagnosis not present

## 2016-01-21 ENCOUNTER — Other Ambulatory Visit: Payer: Self-pay | Admitting: Nurse Practitioner

## 2016-01-21 DIAGNOSIS — R413 Other amnesia: Secondary | ICD-10-CM

## 2016-01-26 ENCOUNTER — Other Ambulatory Visit: Payer: Self-pay | Admitting: Nurse Practitioner

## 2016-01-26 ENCOUNTER — Other Ambulatory Visit: Payer: Self-pay | Admitting: Internal Medicine

## 2016-01-26 DIAGNOSIS — E785 Hyperlipidemia, unspecified: Secondary | ICD-10-CM

## 2016-01-26 DIAGNOSIS — F418 Other specified anxiety disorders: Secondary | ICD-10-CM

## 2016-01-27 ENCOUNTER — Ambulatory Visit (INDEPENDENT_AMBULATORY_CARE_PROVIDER_SITE_OTHER): Payer: Commercial Managed Care - HMO | Admitting: Orthopedic Surgery

## 2016-01-27 DIAGNOSIS — M1711 Unilateral primary osteoarthritis, right knee: Secondary | ICD-10-CM | POA: Diagnosis not present

## 2016-02-03 ENCOUNTER — Other Ambulatory Visit: Payer: Commercial Managed Care - HMO

## 2016-02-04 ENCOUNTER — Other Ambulatory Visit: Payer: Commercial Managed Care - HMO

## 2016-02-07 ENCOUNTER — Ambulatory Visit (INDEPENDENT_AMBULATORY_CARE_PROVIDER_SITE_OTHER): Payer: Commercial Managed Care - HMO | Admitting: Orthopedic Surgery

## 2016-02-07 DIAGNOSIS — M1711 Unilateral primary osteoarthritis, right knee: Secondary | ICD-10-CM | POA: Diagnosis not present

## 2016-02-09 ENCOUNTER — Ambulatory Visit
Admission: RE | Admit: 2016-02-09 | Discharge: 2016-02-09 | Disposition: A | Discharge status: Home / Self Care | Payer: Commercial Managed Care - HMO | Source: Ambulatory Visit | Attending: Nurse Practitioner | Admitting: Nurse Practitioner

## 2016-02-09 DIAGNOSIS — R748 Abnormal levels of other serum enzymes: Secondary | ICD-10-CM | POA: Diagnosis not present

## 2016-02-10 DIAGNOSIS — H401132 Primary open-angle glaucoma, bilateral, moderate stage: Secondary | ICD-10-CM | POA: Diagnosis not present

## 2016-02-14 ENCOUNTER — Ambulatory Visit (INDEPENDENT_AMBULATORY_CARE_PROVIDER_SITE_OTHER): Payer: Commercial Managed Care - HMO | Admitting: Orthopedic Surgery

## 2016-02-14 DIAGNOSIS — N189 Chronic kidney disease, unspecified: Secondary | ICD-10-CM | POA: Diagnosis not present

## 2016-02-14 DIAGNOSIS — I129 Hypertensive chronic kidney disease with stage 1 through stage 4 chronic kidney disease, or unspecified chronic kidney disease: Secondary | ICD-10-CM | POA: Diagnosis not present

## 2016-02-14 DIAGNOSIS — N179 Acute kidney failure, unspecified: Secondary | ICD-10-CM | POA: Diagnosis not present

## 2016-02-14 DIAGNOSIS — M1711 Unilateral primary osteoarthritis, right knee: Secondary | ICD-10-CM

## 2016-02-14 DIAGNOSIS — D631 Anemia in chronic kidney disease: Secondary | ICD-10-CM | POA: Diagnosis not present

## 2016-02-14 DIAGNOSIS — Z720 Tobacco use: Secondary | ICD-10-CM | POA: Diagnosis not present

## 2016-02-14 DIAGNOSIS — N183 Chronic kidney disease, stage 3 (moderate): Secondary | ICD-10-CM | POA: Diagnosis not present

## 2016-02-14 DIAGNOSIS — N2581 Secondary hyperparathyroidism of renal origin: Secondary | ICD-10-CM | POA: Diagnosis not present

## 2016-02-14 DIAGNOSIS — G8929 Other chronic pain: Secondary | ICD-10-CM

## 2016-02-14 DIAGNOSIS — N390 Urinary tract infection, site not specified: Secondary | ICD-10-CM | POA: Diagnosis not present

## 2016-02-14 DIAGNOSIS — M25561 Pain in right knee: Secondary | ICD-10-CM

## 2016-02-14 DIAGNOSIS — E1129 Type 2 diabetes mellitus with other diabetic kidney complication: Secondary | ICD-10-CM | POA: Diagnosis not present

## 2016-02-14 MED ORDER — LIDOCAINE HCL 1 % IJ SOLN
5.0000 mL | INTRAMUSCULAR | Status: AC | PRN
  Administered 2016-02-14: 5 mL

## 2016-02-14 MED ORDER — SODIUM HYALURONATE (VISCOSUP) 20 MG/2ML IX SOSY
20.0000 mg | PREFILLED_SYRINGE | INTRA_ARTICULAR | Status: AC | PRN
  Administered 2016-02-14: 20 mg via INTRA_ARTICULAR

## 2016-02-14 NOTE — Progress Notes (Signed)
   Procedure Note  Planned Euflexxa #3 today.  Pt c/o pain above the patella.  Doing ok overall.    Patient: [name]             Date of Birth: [birth year]           MRN: [id number]             Visit Date: 02/14/2016  Procedures: Visit Diagnoses: No diagnosis found.  Large Joint Inj Date/Time: 02/14/2016 3:18 PM Performed by: Brand Males Authorized by: Meredith Pel   Consent Given by:  Patient Site marked: the procedure site was marked   Timeout: prior to procedure the correct patient, procedure, and site was verified   Indications:  Pain, joint swelling and diagnostic evaluation Location:  Knee Site:  R knee Prep: patient was prepped and draped in usual sterile fashion   Needle Size:  18 G Needle Length:  1.5 inches Approach:  Superolateral Ultrasound Guidance: No   Fluoroscopic Guidance: No  no Medications:  20 mg Sodium Hyaluronate 20 MG/2ML; 5 mL lidocaine 1 % Aspiration Attempted: Yes   Aspirate amount (mL):  45 Aspirate:  Serous Patient tolerance:  Patient tolerated the procedure well with no immediate complications

## 2016-02-15 DIAGNOSIS — N179 Acute kidney failure, unspecified: Secondary | ICD-10-CM | POA: Diagnosis not present

## 2016-02-18 DIAGNOSIS — N183 Chronic kidney disease, stage 3 (moderate): Secondary | ICD-10-CM | POA: Diagnosis not present

## 2016-02-18 DIAGNOSIS — E875 Hyperkalemia: Secondary | ICD-10-CM | POA: Diagnosis not present

## 2016-02-24 ENCOUNTER — Ambulatory Visit (INDEPENDENT_AMBULATORY_CARE_PROVIDER_SITE_OTHER): Payer: Commercial Managed Care - HMO | Admitting: Nurse Practitioner

## 2016-02-24 ENCOUNTER — Encounter: Payer: Self-pay | Admitting: Nurse Practitioner

## 2016-02-24 VITALS — BP 134/86 | HR 92 | Temp 98.0°F | Resp 18 | Ht 64.0 in | Wt 187.6 lb

## 2016-02-24 DIAGNOSIS — N184 Chronic kidney disease, stage 4 (severe): Secondary | ICD-10-CM | POA: Diagnosis not present

## 2016-02-24 DIAGNOSIS — R413 Other amnesia: Secondary | ICD-10-CM

## 2016-02-24 DIAGNOSIS — G43009 Migraine without aura, not intractable, without status migrainosus: Secondary | ICD-10-CM | POA: Diagnosis not present

## 2016-02-24 DIAGNOSIS — E782 Mixed hyperlipidemia: Secondary | ICD-10-CM

## 2016-02-24 DIAGNOSIS — I10 Essential (primary) hypertension: Secondary | ICD-10-CM

## 2016-02-24 DIAGNOSIS — F418 Other specified anxiety disorders: Secondary | ICD-10-CM

## 2016-02-24 DIAGNOSIS — E559 Vitamin D deficiency, unspecified: Secondary | ICD-10-CM | POA: Diagnosis not present

## 2016-02-24 DIAGNOSIS — Z794 Long term (current) use of insulin: Secondary | ICD-10-CM | POA: Diagnosis not present

## 2016-02-24 DIAGNOSIS — F17200 Nicotine dependence, unspecified, uncomplicated: Secondary | ICD-10-CM

## 2016-02-24 DIAGNOSIS — E1149 Type 2 diabetes mellitus with other diabetic neurological complication: Secondary | ICD-10-CM

## 2016-02-24 MED ORDER — ROSUVASTATIN CALCIUM 20 MG PO TABS: 20.0000 mg | ORAL_TABLET | Freq: Every day | ORAL | 1 refills | Status: DC

## 2016-02-24 MED ORDER — ATENOLOL 50 MG PO TABS: 50.0000 mg | ORAL_TABLET | Freq: Every day | ORAL | 1 refills | Status: DC

## 2016-02-24 NOTE — Patient Instructions (Addendum)
Take VITAMIN D 50,000 units WEEKLY --- **call us and let us know the units you are taking**  Cont humalog 8 units twice daily  To stop alcohol intake Cut back on smoking and stop.    STOP PRAVACHOL START CRESTOR for cholesterol   Increase Atenolol 25 mg twice daily.

## 2016-02-24 NOTE — Progress Notes (Signed)
Careteam: Patient Care Team: [name], NP as PCP - General (Nurse Practitioner) Woodroe Mode (Inactive) as Diabetes Educator [name], MD as Consulting Physician (Ophthalmology)  Advanced Directive information Does patient have an advance directive?: No, Would patient like information on creating an advanced directive?: Yes - Educational materials given  Allergies  Allergen Reactions  . Lisinopril Other (See Comments)    Abnormal Kidney Function   . Morphine And Related     nauseated  . Pioglitazone     REACTION: Desquamation of skin of the palm  . Sulfonamide Derivatives     rash    Chief Complaint  Patient presents with  . Medical Management of Chronic Issues    6 week follow up.      HPI: Patient is a 70 y.o. female seen in the office today for follow up.  At last visit CT scan was ordered to evaluate memory deficit- possible infart- MRI scheduled for the 13th of November Nephrology order due to worsening renal disease- has seen Dr Blanche East. Taking blood pressure log. Instructed her to quit smoking, she has attempted to cut back but still smokes and when she gets aggravated smokes more Blood pressures ranging from 105/76-136-160/78-90 Has not seen psychiatrist- made an appt but missed it, feels like she forgot her appt.  Abdominal US due to elevation of GGT. Medical renal disease noted.  humalog decreased due to low blood sugar--Blood sugars mostly 130s fasting. Taking 8 units twice daily   Saw Dr Ellin Goodie last week Had CT screening lung done- nodule found but not suspicious for cancer however recommended follow up in 1 year conts to drink occasional wine, has not had any "hard liquor"   Prescribed vit D 50,000 units weekly but this was too expensive, got something from the mail order pharmacy that she is taking daily  Review of Systems:  Review of Systems  Constitutional: Negative for activity change, appetite change, fatigue and  fever.  Eyes: Negative for pain, redness and visual disturbance.  Respiratory: Negative for cough and shortness of breath.   Cardiovascular: Negative for chest pain, palpitations and leg swelling.  Gastrointestinal: Negative for abdominal distention, abdominal pain, constipation, diarrhea and nausea.  Genitourinary: Negative for difficulty urinating.  Musculoskeletal: Positive for arthralgias.       Pain controlled on medication  Allergic/Immunologic: Negative for environmental allergies.  Neurological: Negative for dizziness, tremors, syncope, facial asymmetry and weakness.  Psychiatric/Behavioral: Positive for confusion, dysphoric mood and sleep disturbance.       Increase depression, brother gets on her nerves     Past Medical History:  Diagnosis Date  . Anxiety   . Arthritis    lower back and knees   . Asthma    childhood  . Barrett's esophagus   . Depression   . Diabetes mellitus   . Dysphagia   . Eczema   . History of herpes zoster 02/2010   Recovered fully after period of acute herpetic neuralgia tx w/ gabapentin.   Marland Kitchen Hx MRSA infection 2009  . Hyperlipidemia   . Hypertension   . Neuromuscular disorder (HCC)    chronic pain  . Personal history of colonic polyps 10/17/2010   hyperplastic   Past Surgical History:  Procedure Laterality Date  . ABDOMINAL HYSTERECTOMY     unclear when  . CATARACT EXTRACTION Bilateral   . thigh surg     left side due to MRSA   Social History:   reports that she has been smoking  Cigarettes.  She has a 10.00 pack-year smoking history. She has never used smokeless tobacco. She reports that she drinks about 1.2 oz of alcohol per week . She reports that she does not use drugs.  Family History  Problem Relation Age of Onset  . Hypertension Father   . Diabetes Brother   . Colon cancer Neg Hx     Medications: Patient's Medications  New Prescriptions   No medications on file  Previous Medications   ALPRAZOLAM (XANAX) 0.5 MG TABLET     take 1 tablet by mouth twice a day if needed for anxiety   AMBULATORY NON FORMULARY MEDICATION    Trumetrix Lancets 33G Use to test blood sugar Twice daily. Dx: E11.49   AMBULATORY NON FORMULARY MEDICATION    Trumetrix Test Strips Use to test blood sugar twice daily. Dx E11.49   AMLODIPINE (NORVASC) 10 MG TABLET    Take 1 tablet (10 mg total) by mouth daily.   ASPIRIN EC 81 MG TABLET    Take 1 tablet (81 mg total) by mouth daily.   ATENOLOL (TENORMIN) 25 MG TABLET    take 1 tablet by mouth once daily for MIGRAINE HEADACHE   CLOBETASOL CREAM (TEMOVATE) 0.05 %    Apply 1 application topically at bedtime.    DICLOFENAC SODIUM (VOLTAREN) 1 % GEL    Apply 4 g topically 4 (four) times daily as needed.   HYDROXYZINE (ATARAX/VISTARIL) 10 MG TABLET    Take 10 mg by mouth every 6 (six) hours as needed for itching.   INSULIN LISPRO PROT & LISPRO (HUMALOG MIX 75/25 KWIKPEN) (75-25) 100 UNIT/ML KWIKPEN    Inject 8 Units into the skin 2 (two) times daily. DX E11.40   INSULIN SYRINGE-NEEDLE U-100 (INSULIN SYRINGE .5CC/31GX5/16") 31G X 5/16" 0.5 ML MISC    1 each by Does not apply route 2 (two) times daily before a meal.   LANCETS (ONETOUCH ULTRASOFT) LANCETS    Use as instructed   LATANOPROST (XALATAN) 0.005 % OPHTHALMIC SOLUTION    Place 1 drop into both eyes daily. Reported on 08/27/2015   LORATADINE (CLARITIN) 10 MG TABLET    Take 10 mg by mouth as needed.   OMEPRAZOLE (PRILOSEC) 40 MG CAPSULE    take 1 capsule by mouth once daily   PAROXETINE (PAXIL) 40 MG TABLET    Take 1 tablet (40 mg total) by mouth daily.   PRAVASTATIN (PRAVACHOL) 40 MG TABLET    take 1 tablet by mouth once daily   SUMATRIPTAN (IMITREX) 25 MG TABLET    1 tablet daily as needed for headache, May repeat in 2 hours if headache persists or recurs. Max of 2 tablets in 24 hours   TRAMADOL (ULTRAM) 50 MG TABLET    1 by mouth every 6 hours as needed   TRIAMCINOLONE CREAM (KENALOG) 0.1 %    Apply to affected area 2-3 times daily   VITAMIN D,  ERGOCALCIFEROL, (DRISDOL) 50000 UNITS CAPS CAPSULE    Take 1 capsule (50,000 Units total) by mouth every 7 (seven) days.   ZOLPIDEM (AMBIEN) 5 MG TABLET    Take 1 tablet (5 mg total) by mouth at bedtime as needed for sleep.  Modified Medications   No medications on file  Discontinued Medications   No medications on file     Physical Exam:  Vitals:   02/24/16 1020  BP: 134/86  Pulse: 92  Resp: 18  Temp: 98 F (36.7 C)  TempSrc: Oral  SpO2: 95%  Weight:  187 lb 9.6 oz (85.1 kg)  Height: 5\' 4"  (1.626 m)   Body mass index is 32.2 kg/m.  Physical Exam  Constitutional: She is oriented to person, place, and time. She appears well-developed and well-nourished.  Neck: Neck supple.  Cardiovascular: Normal rate, regular rhythm and intact distal pulses.  Exam reveals no gallop and no friction rub.   Murmur (1/6 SEM) heard. Pulmonary/Chest: Effort normal and breath sounds normal. No respiratory distress.  Abdominal: Soft. Bowel sounds are normal. She exhibits no distension.  Lymphadenopathy:    She has no cervical adenopathy.  Neurological: She is alert and oriented to person, place, and time.  Skin: Skin is warm and dry.  Psychiatric: She has a normal mood and affect. Her behavior is normal. Thought content normal.    Labs reviewed: Basic Metabolic Panel:  Recent Labs  09/08/15 0859 01/06/16 1207 01/13/16 1142  NA 148* 138 141  K 4.0 4.3 4.8  CL 113* 107 111*  CO2 21 22 21   GLUCOSE 69 41* 105*  BUN 20 23 28*  CREATININE 1.97* 2.73* 2.89*  CALCIUM 8.6* 8.8 8.6  TSH  --  1.41  --    Liver Function Tests:  Recent Labs  08/27/15 0937 01/06/16 1207 01/13/16 1142  AST 16 15 13   ALT 20 17 14   ALKPHOS 145* 130 122  BILITOT 0.3 0.5 0.3  PROT 6.1 6.1 5.9*  ALBUMIN 3.5* 3.2* 3.4*   No results for input(s): LIPASE, AMYLASE in the last 8760 hours. No results for input(s): AMMONIA in the last 8760 hours. CBC:  Recent Labs  04/22/15 1108 08/27/15 0937 01/06/16 1207   WBC 10.9* 7.7 8.3  NEUTROABS 7.5* 4.8 3,818  HGB  --   --  10.0*  HCT 32.6* 33.7* 30.5*  MCV 86 86 87.4  PLT 287 278 295   Lipid Panel:  Recent Labs  08/27/15 0937 01/06/16 1207  CHOL 270* 239*  HDL 69 71  LDLCALC 165* 137*  TRIG 182* 155*  CHOLHDL 3.9 3.4   TSH:  Recent Labs  01/06/16 1207  TSH 1.41   A1C: Lab Results  Component Value Date   HGBA1C 5.5 01/06/2016     Assessment/Plan 1. Memory deficit Possible due to infarct, MRI has been on hold per pt request because she had too many appt. Pt to call and reschedule.   2. Depression with anxiety Needs follow up with psych, plans to reschedule appt with psych services   3. Mixed hyperlipidemia LDL not at goal. With Ct showing possible infarct LDL goal <70 - rosuvastatin (CRESTOR) 20 MG tablet; Take 1 tablet (20 mg total) by mouth daily.  Dispense: 90 tablet; Refill: 1 - COMPLETE METABOLIC PANEL WITH GFR; Future - Lipid panel; Future  4. Hypertension, essential -not at goal; sbp mostly 150 Will increase atenolol at this time.  - atenolol (TENORMIN) 50 MG tablet; Take 1 tablet (50 mg total) by mouth daily.  Dispense: 90 tablet; Refill: 1 -to notify for sbp <100 or pulse < 60  5. 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 CKD (chronic kidney disease) stage 4, GFR 15-29 ml/min (HCC) -had appt with nephrologist, encouraged smoking cessation. Blood work obtained in office  7. Smoker Encouraged smoking cessation  8. Nonintractable migraine, unspecified migraine type -stable - atenolol (TENORMIN) 50 MG tablet; Take 1 tablet (50 mg total)  by mouth daily.  Dispense: 90 tablet; Refill: 1  9. Vitamin D deficiency -to call with dose of vit d she is taking - VITAMIN D 25 Hydroxy (Vit-D Deficiency, Fractures); Future  Follow up in 3  months, lab work prior to appt.   Carlos American. Harle Battiest  [facility] [phone] 8 am - 5 pm) [phone] (after hours)

## 2016-02-25 ENCOUNTER — Other Ambulatory Visit: Payer: Self-pay | Admitting: Nurse Practitioner

## 2016-02-25 DIAGNOSIS — F418 Other specified anxiety disorders: Secondary | ICD-10-CM

## 2016-02-25 DIAGNOSIS — I10 Essential (primary) hypertension: Secondary | ICD-10-CM

## 2016-02-25 DIAGNOSIS — E785 Hyperlipidemia, unspecified: Secondary | ICD-10-CM

## 2016-03-02 ENCOUNTER — Encounter: Payer: Self-pay | Admitting: Interventional Cardiology

## 2016-03-02 ENCOUNTER — Ambulatory Visit (INDEPENDENT_AMBULATORY_CARE_PROVIDER_SITE_OTHER): Payer: Commercial Managed Care - HMO | Admitting: Interventional Cardiology

## 2016-03-02 VITALS — BP 132/76 | HR 62 | Ht 64.0 in | Wt 191.0 lb

## 2016-03-02 DIAGNOSIS — Z794 Long term (current) use of insulin: Secondary | ICD-10-CM

## 2016-03-02 DIAGNOSIS — I10 Essential (primary) hypertension: Secondary | ICD-10-CM | POA: Diagnosis not present

## 2016-03-02 DIAGNOSIS — F17200 Nicotine dependence, unspecified, uncomplicated: Secondary | ICD-10-CM | POA: Diagnosis not present

## 2016-03-02 DIAGNOSIS — Z7689 Persons encountering health services in other specified circumstances: Secondary | ICD-10-CM | POA: Diagnosis not present

## 2016-03-02 DIAGNOSIS — E782 Mixed hyperlipidemia: Secondary | ICD-10-CM | POA: Diagnosis not present

## 2016-03-02 DIAGNOSIS — E1149 Type 2 diabetes mellitus with other diabetic neurological complication: Secondary | ICD-10-CM

## 2016-03-02 NOTE — Progress Notes (Signed)
Cardiology Office Note   Date:  03/02/2016   ID:  [name], DOB [birth year], MRN [id number]  PCP:  [name], NP    Chief Complaint  Patient presents with  . Establish Care   RF for CAD  Wt Readings from Last 3 Encounters:  03/02/16 86.6 kg (191 lb)  02/24/16 85.1 kg (187 lb 9.6 oz)  01/13/16 87.9 kg (193 lb 12.8 oz)       History of Present Illness: [name] is a 70 y.o. female  with multiple risk factors for coronary artery disease including diabetes, tobacco abuse and prior CVA. She continues to smoke. She has stage IV kidney disease which is followed by nephrology. She has also had hyperlipidemia and is treated with a statin.  She is currently being worked up for a memory deficit.    She denies chest pain or SHOB.  Walking the dog is her most strenuous activity and this does not cause any problems.  No orthopnea.  She has cut back some on her cigarettes.  She smokes more when she is stressed.      Past Medical History:  Diagnosis Date  . Anxiety   . Arthritis    lower back and knees   . Asthma    childhood  . Barrett's esophagus   . Depression   . Diabetes mellitus   . Dysphagia   . Eczema   . History of herpes zoster 02/2010   Recovered fully after period of acute herpetic neuralgia tx w/ gabapentin.   Marland Kitchen Hx MRSA infection 2009  . Hyperlipidemia   . Hypertension   . Neuromuscular disorder (HCC)    chronic pain  . Personal history of colonic polyps 10/17/2010   hyperplastic    Past Surgical History:  Procedure Laterality Date  . ABDOMINAL HYSTERECTOMY     unclear when  . CATARACT EXTRACTION Bilateral   . thigh surg     left side due to MRSA     Current Outpatient Prescriptions  Medication Sig Dispense Refill  . ALPRAZolam (XANAX) 0.5 MG tablet take 1 tablet by mouth twice a day if needed for anxiety 60 tablet 0  . AMBULATORY NON FORMULARY MEDICATION Trumetrix Lancets 33G Use to test blood sugar Twice daily. Dx: E11.49  200 each 3  . AMBULATORY NON FORMULARY MEDICATION Trumetrix Test Strips Use to test blood sugar twice daily. Dx E11.49 200 each 3  . amLODipine (NORVASC) 10 MG tablet Take 1 tablet (10 mg total) by mouth daily. 30 tablet 3  . aspirin EC 81 MG tablet Take 1 tablet (81 mg total) by mouth daily. 30 tablet 5  . atenolol (TENORMIN) 25 MG tablet Take 25 mg by mouth daily.    . Cholecalciferol (VITAMIN D3) 5000 units TABS Take by mouth daily.    . clobetasol cream (TEMOVATE) 7.34 % Apply 1 application topically at bedtime.     . diclofenac sodium (VOLTAREN) 1 % GEL Apply 4 g topically 4 (four) times daily as needed. 100 g 0  . hydrOXYzine (ATARAX/VISTARIL) 10 MG tablet Take 10 mg by mouth every 6 (six) hours as needed for itching.    . Insulin Lispro Prot & Lispro (HUMALOG MIX 75/25 KWIKPEN) (75-25) 100 UNIT/ML Kwikpen Inject 8 Units into the skin 2 (two) times daily. DX E11.40 15 mL 1  . Insulin Syringe-Needle U-100 (INSULIN SYRINGE .5CC/31GX5/16") 31G X 5/16" 0.5 ML MISC 1 each by Does not apply route 2 (two) times daily before  a meal. 100 each 3  . Lancets (ONETOUCH ULTRASOFT) lancets Use as instructed 100 each 12  . latanoprost (XALATAN) 0.005 % ophthalmic solution Place 1 drop into both eyes daily. Reported on 08/27/2015    . loratadine (CLARITIN) 10 MG tablet Take 10 mg by mouth as needed.    Marland Kitchen omeprazole (PRILOSEC) 40 MG capsule take 1 capsule by mouth once daily 30 capsule 6  . PARoxetine (PAXIL) 40 MG tablet Take 1 tablet (40 mg total) by mouth daily. 30 tablet 6  . pravastatin (PRAVACHOL) 40 MG tablet take 1 tablet by mouth once daily 30 tablet 5  . SUMAtriptan (IMITREX) 25 MG tablet 1 tablet daily as needed for headache, May repeat in 2 hours if headache persists or recurs. Max of 2 tablets in 24 hours 40 tablet 0  . traMADol (ULTRAM) 50 MG tablet 1 by mouth every 6 hours as needed 120 tablet 1  . triamcinolone cream (KENALOG) 0.1 % Apply to affected area 2-3 times daily  0  . zolpidem  (AMBIEN) 5 MG tablet Take 1 tablet (5 mg total) by mouth at bedtime as needed for sleep. 30 tablet 2   No current facility-administered medications for this visit.     Allergies:   Lisinopril; Morphine and related; Pioglitazone; and Sulfonamide derivatives    Social History:  The patient  reports that she has been smoking Cigarettes.  She has a 10.00 pack-year smoking history. She has never used smokeless tobacco. She reports that she drinks about 1.2 oz of alcohol per week . She reports that she does not use drugs.   Family History:  The patient's family history includes Diabetes in her brother; Hypertension in her father; Kidney disease in her father.    ROS:  Please see the history of present illness.   Otherwise, review of systems are positive for inability to stop smoking, right knee swelling.   All other systems are reviewed and negative.    PHYSICAL EXAM: VS:  BP 132/76   Pulse 62   Ht 5\' 4"  (1.626 m)   Wt 86.6 kg (191 lb)   SpO2 92%   BMI 32.79 kg/m  , BMI Body mass index is 32.79 kg/m. GEN: Well nourished, well developed, in no acute distress  HEENT: normal  Neck: no JVD, carotid bruits, or masses Cardiac: RRR; no murmurs, rubs, or gallops, tr bilateral LE edema  Respiratory:  clear to auscultation bilaterally, normal work of breathing GI: soft, nontender, nondistended, + BS, obese MS: no deformity or atrophy  Skin: warm and dry, no rash Neuro:  Strength and sensation are intact Psych: euthymic mood, full affect   EKG:   The ekg ordered today demonstrates SB, inferior ST changes   Recent Labs: 01/06/2016: Hemoglobin 10.0; Platelets 295; TSH 1.41 01/13/2016: ALT 14; BUN 28; Creat 2.89; Potassium 4.8; Sodium 141   Lipid Panel    Component Value Date/Time   CHOL 239 (H) 01/06/2016 1207   CHOL 270 (H) 08/27/2015 0937   TRIG 155 (H) 01/06/2016 1207   HDL 71 01/06/2016 1207   HDL 69 08/27/2015 0937   CHOLHDL 3.4 01/06/2016 1207   VLDL 31 (H) 01/06/2016 1207    LDLCALC 137 (H) 01/06/2016 1207   LDLCALC 165 (H) 08/27/2015 0937   LDLDIRECT 127 (H) 07/17/2012 1415     Other studies Reviewed: Additional studies/ records that were reviewed today with results demonstrating: Prior lipid results reviewed.   ASSESSMENT AND PLAN:  1. Risk factors for coronary artery disease  including hyperlipidemia: Agree with changing pravastatin to Crestor. LDL target should certainly be less than 100 and ideally closer to 70.  This was changed on 02/24/2016. 2. Tobacco abuse: I stressed the importance of stopping smoking. We discussed her using patches, pills or gum. She feels like she can wean herself off. 3. Chronic kidney disease: I explained her that if she ever required an angiogram, this may further damage her kidneys. Hopefully, this will be an additional incentive to try to improve her health and prevent heart problem. 4. I encouraged her to try to exercise more. Currently, she walks her dog, but feels like the dog pulls her. I asked her to try and do a dedicated 30 minute walk, 5 days a week. 5. There is some confusion with her medications. She was unclear about which cholesterol-lowering drugs she was taking. Going forward, medication compliance will be very important. 6. HTN: BP controlled.   Current medicines are reviewed at length with the patient today.  The patient concerns regarding her medicines were addressed.  The following changes have been made:  No change  Labs/ tests ordered today include:   Orders Placed This Encounter  Procedures  . EKG 12-Lead    Recommend 150 minutes/week of aerobic exercise Low fat, low carb, high fiber diet recommended  Disposition:   FU prn   Signed, Larae Grooms, MD  03/02/2016 8:37 AM    [facility] [address], [place], [place]  [address] Phone: [phone]; Fax: [phone]

## 2016-03-02 NOTE — Patient Instructions (Signed)
**Note De-Identified  Obfuscation** Medication Instructions:  Same-no changes  Labwork: None  Testing/Procedures: None  Follow-Up: As needed  Any Other Special Instructions Will Be Listed Below (If Applicable). Dr Irish Lack recommends that you stop smoking and start exercising 30 minutes daily 5 days a week.     If you need a refill on your cardiac medications before your next appointment, please call your pharmacy.

## 2016-03-03 ENCOUNTER — Other Ambulatory Visit: Payer: Self-pay | Admitting: Nurse Practitioner

## 2016-03-05 ENCOUNTER — Ambulatory Visit
Admission: RE | Admit: 2016-03-05 | Discharge: 2016-03-05 | Disposition: A | Discharge status: Home / Self Care | Payer: Commercial Managed Care - HMO | Source: Ambulatory Visit | Attending: Nurse Practitioner | Admitting: Nurse Practitioner

## 2016-03-05 DIAGNOSIS — R413 Other amnesia: Secondary | ICD-10-CM | POA: Diagnosis not present

## 2016-03-15 ENCOUNTER — Other Ambulatory Visit: Payer: Self-pay | Admitting: Nurse Practitioner

## 2016-03-15 DIAGNOSIS — E1149 Type 2 diabetes mellitus with other diabetic neurological complication: Secondary | ICD-10-CM

## 2016-03-20 ENCOUNTER — Other Ambulatory Visit: Payer: Self-pay | Admitting: Nurse Practitioner

## 2016-03-20 DIAGNOSIS — R413 Other amnesia: Secondary | ICD-10-CM

## 2016-03-22 ENCOUNTER — Other Ambulatory Visit: Payer: Self-pay

## 2016-03-22 DIAGNOSIS — E1149 Type 2 diabetes mellitus with other diabetic neurological complication: Secondary | ICD-10-CM

## 2016-03-22 MED ORDER — TRUEPLUS LANCETS 33G MISC: 3 refills | Status: DC

## 2016-03-22 MED ORDER — INSULIN LISPRO PROT & LISPRO (75-25 MIX) 100 UNIT/ML KWIKPEN: 8.0000 [IU] | PEN_INJECTOR | Freq: Two times a day (BID) | SUBCUTANEOUS | 1 refills | Status: DC

## 2016-03-22 MED ORDER — GLUCOSE BLOOD VI STRP: ORAL_STRIP | 3 refills | Status: DC

## 2016-03-22 MED ORDER — "INSULIN SYRINGE 31G X 5/16"" 0.5 ML MISC": 1.0000 | Freq: Two times a day (BID) | 3 refills | Status: DC

## 2016-03-23 ENCOUNTER — Telehealth: Payer: Self-pay

## 2016-03-23 NOTE — Telephone Encounter (Signed)
Patient called and asked that I call her back. When I called, she stated that she would like for me to tell her again what her brain scan that was done on 03/05/16 said. I told her that Janett Billow stated that the scan showed that she had several old, small infarcts. Patient stated that she had never been told that before but wondered if those infarcts were when she had severe headaches. I stated that I could not say for sure but those infarcts are the reason why she needs to see neurology. Patient stated that she had not heard anything from neurology so I asked Earlie Server about the referral. Earlie Server stated that patient was referred to [facility] and that patient should get a call from them soon.   Patient stated that she understood and would wait on a call from neurology.

## 2016-03-26 ENCOUNTER — Other Ambulatory Visit: Payer: Self-pay | Admitting: Internal Medicine

## 2016-03-26 ENCOUNTER — Other Ambulatory Visit: Payer: Self-pay | Admitting: Nurse Practitioner

## 2016-03-26 DIAGNOSIS — F418 Other specified anxiety disorders: Secondary | ICD-10-CM

## 2016-03-26 DIAGNOSIS — I10 Essential (primary) hypertension: Secondary | ICD-10-CM

## 2016-03-26 DIAGNOSIS — G43009 Migraine without aura, not intractable, without status migrainosus: Secondary | ICD-10-CM

## 2016-04-06 ENCOUNTER — Other Ambulatory Visit: Payer: Self-pay | Admitting: Internal Medicine

## 2016-04-07 NOTE — Telephone Encounter (Signed)
rx called into pharmacy

## 2016-04-07 NOTE — Telephone Encounter (Signed)
Ok to fill? Last refilled 01/13/16 #30 with 2rfs

## 2016-04-25 ENCOUNTER — Other Ambulatory Visit: Payer: Self-pay | Admitting: Nurse Practitioner

## 2016-04-25 DIAGNOSIS — R0781 Pleurodynia: Secondary | ICD-10-CM | POA: Diagnosis not present

## 2016-04-25 DIAGNOSIS — K2270 Barrett's esophagus without dysplasia: Secondary | ICD-10-CM | POA: Diagnosis not present

## 2016-04-25 DIAGNOSIS — E1122 Type 2 diabetes mellitus with diabetic chronic kidney disease: Secondary | ICD-10-CM | POA: Diagnosis not present

## 2016-04-25 DIAGNOSIS — N185 Chronic kidney disease, stage 5: Secondary | ICD-10-CM | POA: Diagnosis not present

## 2016-04-25 DIAGNOSIS — I120 Hypertensive chronic kidney disease with stage 5 chronic kidney disease or end stage renal disease: Secondary | ICD-10-CM | POA: Diagnosis not present

## 2016-04-25 DIAGNOSIS — F329 Major depressive disorder, single episode, unspecified: Secondary | ICD-10-CM | POA: Diagnosis not present

## 2016-04-25 DIAGNOSIS — J189 Pneumonia, unspecified organism: Secondary | ICD-10-CM | POA: Diagnosis not present

## 2016-04-25 DIAGNOSIS — D631 Anemia in chronic kidney disease: Secondary | ICD-10-CM | POA: Diagnosis not present

## 2016-04-25 DIAGNOSIS — F418 Other specified anxiety disorders: Secondary | ICD-10-CM

## 2016-04-25 DIAGNOSIS — R1310 Dysphagia, unspecified: Secondary | ICD-10-CM | POA: Diagnosis not present

## 2016-04-26 DIAGNOSIS — F329 Major depressive disorder, single episode, unspecified: Secondary | ICD-10-CM | POA: Diagnosis not present

## 2016-04-26 DIAGNOSIS — D631 Anemia in chronic kidney disease: Secondary | ICD-10-CM | POA: Diagnosis not present

## 2016-04-26 DIAGNOSIS — R1310 Dysphagia, unspecified: Secondary | ICD-10-CM | POA: Diagnosis not present

## 2016-04-26 DIAGNOSIS — N185 Chronic kidney disease, stage 5: Secondary | ICD-10-CM | POA: Diagnosis not present

## 2016-04-26 DIAGNOSIS — J189 Pneumonia, unspecified organism: Secondary | ICD-10-CM | POA: Diagnosis not present

## 2016-04-26 DIAGNOSIS — K2270 Barrett's esophagus without dysplasia: Secondary | ICD-10-CM | POA: Diagnosis not present

## 2016-04-26 DIAGNOSIS — E1122 Type 2 diabetes mellitus with diabetic chronic kidney disease: Secondary | ICD-10-CM | POA: Diagnosis not present

## 2016-04-26 DIAGNOSIS — I120 Hypertensive chronic kidney disease with stage 5 chronic kidney disease or end stage renal disease: Secondary | ICD-10-CM | POA: Diagnosis not present

## 2016-04-26 DIAGNOSIS — R0781 Pleurodynia: Secondary | ICD-10-CM | POA: Diagnosis not present

## 2016-04-28 DIAGNOSIS — Z6832 Body mass index (BMI) 32.0-32.9, adult: Secondary | ICD-10-CM | POA: Diagnosis not present

## 2016-04-28 DIAGNOSIS — N2581 Secondary hyperparathyroidism of renal origin: Secondary | ICD-10-CM | POA: Diagnosis not present

## 2016-04-28 DIAGNOSIS — E1129 Type 2 diabetes mellitus with other diabetic kidney complication: Secondary | ICD-10-CM | POA: Diagnosis not present

## 2016-04-28 DIAGNOSIS — N179 Acute kidney failure, unspecified: Secondary | ICD-10-CM | POA: Diagnosis not present

## 2016-04-28 DIAGNOSIS — N184 Chronic kidney disease, stage 4 (severe): Secondary | ICD-10-CM | POA: Diagnosis not present

## 2016-04-28 DIAGNOSIS — I129 Hypertensive chronic kidney disease with stage 1 through stage 4 chronic kidney disease, or unspecified chronic kidney disease: Secondary | ICD-10-CM | POA: Diagnosis not present

## 2016-04-28 DIAGNOSIS — Z720 Tobacco use: Secondary | ICD-10-CM | POA: Diagnosis not present

## 2016-04-28 DIAGNOSIS — D631 Anemia in chronic kidney disease: Secondary | ICD-10-CM | POA: Diagnosis not present

## 2016-05-23 ENCOUNTER — Ambulatory Visit (INDEPENDENT_AMBULATORY_CARE_PROVIDER_SITE_OTHER): Payer: Commercial Managed Care - HMO | Admitting: Neurology

## 2016-05-23 ENCOUNTER — Encounter: Payer: Self-pay | Admitting: Neurology

## 2016-05-23 VITALS — BP 124/68 | HR 70 | Ht 64.0 in | Wt 189.4 lb

## 2016-05-23 DIAGNOSIS — I6309 Cerebral infarction due to thrombosis of other precerebral artery: Secondary | ICD-10-CM

## 2016-05-23 DIAGNOSIS — R413 Other amnesia: Secondary | ICD-10-CM | POA: Diagnosis not present

## 2016-05-23 DIAGNOSIS — F320 Major depressive disorder, single episode, mild: Secondary | ICD-10-CM

## 2016-05-23 NOTE — Patient Instructions (Signed)
1. It is very important to take your medications regularly to help prevent another stroke and maintain kidney function 2. Recommend seeing the psychiatrist as recommended by your family doctor 3. Follow-up in 1 year, call for any changes

## 2016-05-23 NOTE — Progress Notes (Signed)
NEUROLOGY CONSULTATION NOTE  [name] MRN: [id number] DOB: 26-Oct-1945  Referring provider: Dani Gobble Primary care provider: Dani Gobble  Reason for consult:  Memory loss  Thank you for your kind referral of [name] for consultation of the above symptoms. Although her history is well known to you, please allow me to reiterate it for the purpose of our medical record. Records and images were personally reviewed where available.  HISTORY OF PRESENT ILLNESS: This is a 71 year old right-handed woman with a history of hypertension, hyperlipidemia, diabetes, anxiety, presenting for evaluation of worsening memory. She feels she forgets things sometimes. She lives alone and reports missing a telephone bill last month due to a tendency to just throw a bill away. She occasionally forgets her medication or feels tired of taking them. She does not cook and does not drive. She states her brother aggravates her so she tends to be by herself more, he lives close by. She endorses finishing a bottle of wine or half a bottle of wine, and states "you are just talking to the wall when you tell me to stop drinking." When she is warned by her physicians about the drinking, she feels that they can find something wrong with her, "I know I'm crazy and I have bipolar." She has a short fuse and has not yet seen a psychiatrist. She states she does not have a whole lot of patience. She has headaches around 2-3 times a day, triggered by aggravation. Headaches start in the base of her neck or on the right side, no associated nausea/vomiting, vision changes. The pain can last all day. She takes Atenolol for the migraines, which she feels helps sometimes. She has occasional dizziness. She denies any diplopia, dysarthria/dysphagia, neck pain, focal numbness/tingling/weakness. She has occasional low back pain and constipation. She states she "does a lot of falling." She denies any prior history of  stroke. I personally reviewed MRI brain done 03/05/2016 which showed a punctate acute/subacute small vessel infarction within the right basal ganglia. There is chronic small vessel disease in the brainstem, cerebellum, thalami, basal ganglia, and cerebral hemispheric white matter. There are old cortical and subcortical infarctions at the right parietal vertex and left posterior parietal region. Hemosiderin deposition seen in the region of old cortical infarctions.   Laboratory Data: Lab Results  Component Value Date   TSH 1.41 01/06/2016   Lab Results  Component Value Date   [id number] 211 09/05/2013     PAST MEDICAL HISTORY: Past Medical History:  Diagnosis Date  . Anxiety   . Arthritis    lower back and knees   . Asthma    childhood  . Barrett's esophagus   . Depression   . Diabetes mellitus   . Dysphagia   . Eczema   . History of herpes zoster 02/2010   Recovered fully after period of acute herpetic neuralgia tx w/ gabapentin.   Marland Kitchen Hx MRSA infection 2009  . Hyperlipidemia   . Hypertension   . Neuromuscular disorder (HCC)    chronic pain  . Personal history of colonic polyps 10/17/2010   hyperplastic    PAST SURGICAL HISTORY: Past Surgical History:  Procedure Laterality Date  . ABDOMINAL HYSTERECTOMY     unclear when  . CATARACT EXTRACTION Bilateral   . thigh surg     left side due to MRSA    MEDICATIONS: Current Outpatient Prescriptions on File Prior to Visit  Medication Sig Dispense Refill  . ALPRAZolam (XANAX) 0.5 MG  tablet TAKE 1 TABLET BY MOUTH TWICE DAILY IF NEEDED FOR ANXIETY 60 tablet 0  . AMBULATORY NON FORMULARY MEDICATION Trumetrix Lancets 33G Use to test blood sugar Twice daily. Dx: E11.49 200 each 3  . AMBULATORY NON FORMULARY MEDICATION Trumetrix Test Strips Use to test blood sugar twice daily. Dx E11.49 200 each 3  . amLODipine (NORVASC) 10 MG tablet Take 1 tablet (10 mg total) by mouth daily. 30 tablet 3  . aspirin EC 81 MG tablet Take 1 tablet  (81 mg total) by mouth daily. 30 tablet 5  . atenolol (TENORMIN) 25 MG tablet take 1 tablet by mouth once daily for migraines 30 tablet 3  . Blood Glucose Monitoring Suppl (TRUE METRIX AIR GLUCOSE METER) w/Device KIT CHECK BLOOD SUGAR TWICE DAILY 1 kit 0  . Cholecalciferol (VITAMIN D3) 5000 units TABS Take by mouth daily.    . clobetasol cream (TEMOVATE) 6.20 % Apply 1 application topically at bedtime.     . diclofenac sodium (VOLTAREN) 1 % GEL Apply 4 g topically 4 (four) times daily as needed. 100 g 0  . glucose blood (TRUE METRIX BLOOD GLUCOSE TEST) test strip CHECK BLOOD SUGAR TWICE DAILY 200 each 3  . hydrOXYzine (ATARAX/VISTARIL) 10 MG tablet Take 10 mg by mouth every 6 (six) hours as needed for itching.    . Insulin Lispro Prot & Lispro (HUMALOG MIX 75/25 KWIKPEN) (75-25) 100 UNIT/ML Kwikpen Inject 8 Units into the skin 2 (two) times daily. DX E11.40 15 mL 1  . Insulin Syringe-Needle U-100 (INSULIN SYRINGE .5CC/31GX5/16") 31G X 5/16" 0.5 ML MISC 1 each by Does not apply route 2 (two) times daily before a meal. 100 each 3  . latanoprost (XALATAN) 0.005 % ophthalmic solution Place 1 drop into both eyes daily. Reported on 08/27/2015    . lisinopril (PRINIVIL,ZESTRIL) 40 MG tablet take 1 tablet by mouth once daily for blood pressure 30 tablet 3  . loratadine (CLARITIN) 10 MG tablet Take 10 mg by mouth as needed.    Marland Kitchen omeprazole (PRILOSEC) 40 MG capsule take 1 capsule by mouth once daily 30 capsule 6  . PARoxetine (PAXIL) 40 MG tablet take 1 tablet by mouth once daily 30 tablet 6  . rosuvastatin (CRESTOR) 20 MG tablet Take 20 mg by mouth daily.    . SUMAtriptan (IMITREX) 25 MG tablet 1 tablet daily as needed for headache, May repeat in 2 hours if headache persists or recurs. Max of 2 tablets in 24 hours 40 tablet 0  . traMADol (ULTRAM) 50 MG tablet 1 by mouth every 6 hours as needed 120 tablet 1  . triamcinolone cream (KENALOG) 0.1 % Apply to affected area 2-3 times daily  0  . TRUEPLUS LANCETS  33G MISC CHECK BLOOD SUGAR TWICE DAILY 200 each 3  . zolpidem (AMBIEN) 5 MG tablet Take 1 tablet (5 mg total) by mouth at bedtime as needed for sleep. 30 tablet 2   No current facility-administered medications on file prior to visit.     ALLERGIES: Allergies  Allergen Reactions  . Lisinopril Other (See Comments)    Abnormal Kidney Function   . Morphine And Related     nauseated  . Pioglitazone     REACTION: Desquamation of skin of the palm  . Sulfonamide Derivatives     rash    FAMILY HISTORY: Family History  Problem Relation Age of Onset  . Hypertension Father   . Kidney disease Father   . Diabetes Brother   . Colon cancer  Neg Hx   . CAD Neg Hx     SOCIAL HISTORY: Social History   Social History  . Marital status: Divorced    Spouse name: N/A  . Number of children: 0  . Years of education: N/A   Occupational History  . Retired [employer]   Social History Main Topics  . Smoking status: Current Every Day Smoker    Packs/day: 0.25    Years: 40.00    Types: Cigarettes  . Smokeless tobacco: Never Used     Comment: smokes 1 pack of cigerettes a week- off and on  . Alcohol use 1.2 oz/week    2 Standard drinks or equivalent per week     Comment: wine and beer- occasionally   . Drug use: No  . Sexual activity: No   Other Topics Concern  . Not on file   Social History Narrative   Financial assistance approved for 100% discount at [facility] and has [facility] card per [employer]   02/10/2010      3 caffeine drinks daily     REVIEW OF SYSTEMS: Constitutional: No fevers, chills, or sweats, no generalized fatigue, change in appetite Eyes: No visual changes, double vision, eye pain Ear, nose and throat: No hearing loss, ear pain, nasal congestion, sore throat Cardiovascular: No chest pain, palpitations Respiratory:  No shortness of breath at rest or with exertion, wheezes GastrointestinaI: No nausea, vomiting, diarrhea, abdominal pain, fecal  incontinence Genitourinary:  No dysuria, urinary retention or frequency Musculoskeletal:  No neck pain,+ back pain Integumentary: No rash, pruritus, skin lesions Neurological: as above Psychiatric: No depression, insomnia, anxiety Endocrine: No palpitations, fatigue, diaphoresis, mood swings, change in appetite, change in weight, increased thirst Hematologic/Lymphatic:  No anemia, purpura, petechiae. Allergic/Immunologic: no itchy/runny eyes, nasal congestion, recent allergic reactions, rashes  PHYSICAL EXAM: Vitals:   05/23/16 0858  BP: 124/68  Pulse: 70   General: No acute distress Head:  Normocephalic/atraumatic Eyes: Fundoscopic exam shows bilateral sharp discs, no vessel changes, exudates, or hemorrhages Neck: supple, no paraspinal tenderness, full range of motion Back: No paraspinal tenderness Heart: regular rate and rhythm Lungs: Clear to auscultation bilaterally. Vascular: No carotid bruits. Skin/Extremities: No rash, no edema Neurological Exam: Mental status: alert and oriented to person, place, and time, no dysarthria or aphasia, Fund of knowledge is appropriate.  Recent and remote memory are intact.  Attention and concentration are normal.    Able to name objects and repeat phrases. CDT 5/5  MMSE - Mini Mental State Exam 05/23/2016 01/06/2016  Orientation to time 5 4  Orientation to Place 5 5  Registration 3 3  Attention/ Calculation 5 5  Recall 2 2  Language- name 2 objects 2 2  Language- repeat 1 1  Language- follow 3 step command 3 2  Language- read & follow direction 1 1  Write a sentence 1 1  Copy design 1 0  Total score 29 26   Cranial nerves: CN I: not tested CN II: pupils equal, round and reactive to light, visual fields intact, fundi unremarkable. CN III, IV, VI:  full range of motion, no nystagmus, no ptosis CN V: facial sensation intact CN VII: upper and lower face symmetric CN VIII: hearing intact to finger rub CN IX, X: gag intact, uvula  midline CN XI: sternocleidomastoid and trapezius muscles intact CN XII: tongue midline Bulk & Tone: normal, no fasciculations. Motor: 5/5 throughout with no pronator drift. Sensation: intact to light touch, cold, pin, vibration and joint position sense.  No  extinction to double simultaneous stimulation.  Romberg test negative Deep Tendon Reflexes: +2 throughout, no ankle clonus Plantar responses: downgoing bilaterally Cerebellar: no incoordination on finger to nose, heel to shin. No dysdiadochokinesia Gait: narrow-based and steady, able to tandem walk adequately. Tremor: none  IMPRESSION: This is a 70 year old right-handed woman with a history of hypertension, hyperlipidemia, diabetes, anxiety, presenting for evaluation of worsening memory. Her neurological exam is non-focal, MMSE today normal 29/30 (previously 26/30 in September 2017). MRI brain done in November 2017 showed a punctate subcortical infarct in the right basal ganglia, as well as chronic cortical and subcortical infarcts. She is asymptomatic from these. She has not been fully compliant with medications, we discussed the importance of taking aspirin regularly, as well as control of blood pressure and cholesterol for secondary stroke prevention. Memory changes appear to be due to underlying psychiatric issues, she was advised to see Psychiatry as recommended by her PCP. She knows to go to the ER for any change in symptoms and will follow-up in 1 year or earlier if needed.   Thank you for allowing me to participate in the care of this patient. Please do not hesitate to call for any questions or concerns.   Ellouise Newer, M.D.  CC: [name], NP

## 2016-05-25 ENCOUNTER — Other Ambulatory Visit: Payer: Self-pay | Admitting: Nurse Practitioner

## 2016-05-25 DIAGNOSIS — F418 Other specified anxiety disorders: Secondary | ICD-10-CM

## 2016-05-29 ENCOUNTER — Other Ambulatory Visit: Payer: Self-pay | Admitting: Nurse Practitioner

## 2016-05-29 DIAGNOSIS — IMO0002 Reserved for concepts with insufficient information to code with codable children: Secondary | ICD-10-CM | POA: Insufficient documentation

## 2016-05-29 DIAGNOSIS — F320 Major depressive disorder, single episode, mild: Secondary | ICD-10-CM | POA: Insufficient documentation

## 2016-05-29 DIAGNOSIS — R413 Other amnesia: Secondary | ICD-10-CM | POA: Insufficient documentation

## 2016-05-30 ENCOUNTER — Other Ambulatory Visit: Payer: Commercial Managed Care - HMO

## 2016-05-30 DIAGNOSIS — Z794 Long term (current) use of insulin: Secondary | ICD-10-CM | POA: Diagnosis not present

## 2016-05-30 DIAGNOSIS — E782 Mixed hyperlipidemia: Secondary | ICD-10-CM

## 2016-05-30 DIAGNOSIS — E559 Vitamin D deficiency, unspecified: Secondary | ICD-10-CM | POA: Diagnosis not present

## 2016-05-30 DIAGNOSIS — E1149 Type 2 diabetes mellitus with other diabetic neurological complication: Secondary | ICD-10-CM | POA: Diagnosis not present

## 2016-05-30 LAB — COMPLETE METABOLIC PANEL WITH GFR
ALBUMIN: 3.3 g/dL — AB (ref 3.6–5.1)
ALK PHOS: 103 U/L (ref 33–130)
ALT: 16 U/L (ref 6–29)
AST: 15 U/L (ref 10–35)
BILIRUBIN TOTAL: 0.5 mg/dL (ref 0.2–1.2)
BUN: 33 mg/dL — ABNORMAL HIGH (ref 7–25)
CALCIUM: 8.9 mg/dL (ref 8.6–10.4)
CO2: 23 mmol/L (ref 20–31)
CREATININE: 3.51 mg/dL — AB (ref 0.60–0.93)
Chloride: 110 mmol/L (ref 98–110)
GFR, Est African American: 14 mL/min — ABNORMAL LOW (ref >=60)
GFR, Est Non African American: 13 mL/min — ABNORMAL LOW (ref >=60)
Glucose, Bld: 131 mg/dL — ABNORMAL HIGH (ref 65–99)
POTASSIUM: 5.2 mmol/L (ref 3.5–5.3)
Sodium: 141 mmol/L (ref 135–146)
TOTAL PROTEIN: 6 g/dL — AB (ref 6.1–8.1)

## 2016-05-30 LAB — LIPID PANEL
CHOLESTEROL: 218 mg/dL — AB (ref <200)
HDL: 55 mg/dL (ref >50)
LDL Cholesterol: 130 mg/dL — ABNORMAL HIGH (ref <100)
Total CHOL/HDL Ratio: 4 Ratio (ref <5.0)
Triglycerides: 163 mg/dL — ABNORMAL HIGH (ref <150)
VLDL: 33 mg/dL — ABNORMAL HIGH (ref <30)

## 2016-05-30 LAB — HEMOGLOBIN A1C
Hgb A1c MFr Bld: 5.5 % (ref <5.7)
MEAN PLASMA GLUCOSE: 111 mg/dL

## 2016-05-31 LAB — VITAMIN D 25 HYDROXY (VIT D DEFICIENCY, FRACTURES): VIT D 25 HYDROXY: 28 ng/mL — AB (ref 30–100)

## 2016-06-01 ENCOUNTER — Telehealth: Payer: Self-pay

## 2016-06-01 ENCOUNTER — Encounter: Payer: Self-pay | Admitting: Nurse Practitioner

## 2016-06-01 ENCOUNTER — Ambulatory Visit (INDEPENDENT_AMBULATORY_CARE_PROVIDER_SITE_OTHER): Payer: Medicare HMO | Admitting: Nurse Practitioner

## 2016-06-01 VITALS — BP 134/80 | HR 84 | Temp 97.5°F | Resp 19 | Ht 64.0 in | Wt 188.4 lb

## 2016-06-01 DIAGNOSIS — E1149 Type 2 diabetes mellitus with other diabetic neurological complication: Secondary | ICD-10-CM | POA: Diagnosis not present

## 2016-06-01 DIAGNOSIS — Z720 Tobacco use: Secondary | ICD-10-CM

## 2016-06-01 DIAGNOSIS — R413 Other amnesia: Secondary | ICD-10-CM

## 2016-06-01 DIAGNOSIS — G43009 Migraine without aura, not intractable, without status migrainosus: Secondary | ICD-10-CM | POA: Diagnosis not present

## 2016-06-01 DIAGNOSIS — I10 Essential (primary) hypertension: Secondary | ICD-10-CM | POA: Diagnosis not present

## 2016-06-01 DIAGNOSIS — Z794 Long term (current) use of insulin: Secondary | ICD-10-CM | POA: Diagnosis not present

## 2016-06-01 DIAGNOSIS — E782 Mixed hyperlipidemia: Secondary | ICD-10-CM

## 2016-06-01 MED ORDER — ROSUVASTATIN CALCIUM 10 MG PO TABS: 10.0000 mg | ORAL_TABLET | Freq: Every day | ORAL | 1 refills | Status: DC

## 2016-06-01 MED ORDER — LINAGLIPTIN 5 MG PO TABS: 5.0000 mg | ORAL_TABLET | Freq: Every day | ORAL | 1 refills | Status: DC

## 2016-06-01 MED ORDER — ATENOLOL 50 MG PO TABS: 50.0000 mg | ORAL_TABLET | Freq: Every day | ORAL | 0 refills | Status: DC

## 2016-06-01 MED ORDER — EVOLOCUMAB 140 MG/ML ~~LOC~~ SOSY: 1.0000 | PREFILLED_SYRINGE | SUBCUTANEOUS | 0 refills | Status: DC

## 2016-06-01 NOTE — Telephone Encounter (Signed)
I called Repatha Rep, Merry Proud, to see when he could come by to get patient stated on Repatha. He stated that he could come by tomorrow. Chrae stated that she would be in the office and be able to speak with Merry Proud when he gets her. Forms were completed with patient information and placed in filing cabinet in the folder for the 9th.

## 2016-06-01 NOTE — Progress Notes (Signed)
Careteam: Patient Care Team: [name], NP as PCP - General (Nurse Practitioner) Woodroe Mode (Inactive) as Diabetes Educator [name], MD as Consulting Physician (Ophthalmology)  Advanced Directive information Does Patient Have a Medical Advance Directive?: No, Would patient like information on creating a medical advance directive?: Yes (MAU/Ambulatory/Procedural Areas - Information given) (Pt has paperwork but has not completed it yet. )  Allergies  Allergen Reactions  . Lisinopril Other (See Comments)    Abnormal Kidney Function   . Morphine And Related     nauseated  . Pioglitazone     REACTION: Desquamation of skin of the palm  . Sulfonamide Derivatives     rash    Chief Complaint  Patient presents with  . Medical Management of Chronic Issues    3 month follow up for DM, chloesterol, blood pressure.     HPI: Patient is a 71 y.o. female with a history of memory loss, bipolar, DM, HTN, migraine headaches, CKD, and more seen in the office today for routine follow up.  SBP ranging from 120-160s-- mostly elevated over 140s at last visit atenolol was increased to 50 mg daily but appears she is taking 25 mg daily  A1c on recent labs of 5.5, does not report any hypoglycemic episodes but fasting blood sugars in the 60s Since last visit has followed with nephrologist and neurologist  Kidney function conts to decline, she does not wish to do dialysis.  Neurologist felt that memory loss was due to depression/bipolar, pt has not seen psychiatrist yet. Felt like she was asymptomatic from prior CVA. Goal is to control blood pressure, blood sugar and cholesterol for stroke prevention.    Review of Systems:  Review of Systems  Constitutional: Negative for activity change, appetite change, chills, fatigue and fever.  HENT: Negative for congestion, postnasal drip, rhinorrhea, sinus pain, sinus pressure, sneezing and sore throat.   Respiratory: Negative for cough,  chest tightness, shortness of breath and wheezing.   Cardiovascular: Negative for chest pain, palpitations and leg swelling.  Gastrointestinal: Negative for abdominal pain.  Endocrine: Negative for polydipsia, polyphagia and polyuria.  Musculoskeletal: Positive for arthralgias.       Right hip pain  Neurological: Negative for dizziness, syncope, light-headedness and headaches.    Past Medical History:  Diagnosis Date  . Anxiety   . Arthritis    lower back and knees   . Asthma    childhood  . Barrett's esophagus   . Depression   . Diabetes mellitus   . Dysphagia   . Eczema   . History of herpes zoster 02/2010   Recovered fully after period of acute herpetic neuralgia tx w/ gabapentin.   Marland Kitchen Hx MRSA infection 2009  . Hyperlipidemia   . Hypertension   . Neuromuscular disorder (HCC)    chronic pain  . Personal history of colonic polyps 10/17/2010   hyperplastic   Past Surgical History:  Procedure Laterality Date  . ABDOMINAL HYSTERECTOMY     unclear when  . CATARACT EXTRACTION Bilateral   . thigh surg     left side due to MRSA   Social History:   reports that she has been smoking Cigarettes.  She has a 10.00 pack-year smoking history. She has never used smokeless tobacco. She reports that she drinks about 1.2 oz of alcohol per week . She reports that she does not use drugs.  Family History  Problem Relation Age of Onset  . Hypertension Father   . Kidney disease  Father   . Diabetes Brother   . Colon cancer Neg Hx   . CAD Neg Hx     Medications: Patient's Medications  New Prescriptions   No medications on file  Previous Medications   ALPRAZOLAM (XANAX) 0.5 MG TABLET    TAKE 1 TABLET BY MOUTH TWICE DAILY IF NEEDED FOR ANXIETY   AMBULATORY NON FORMULARY MEDICATION    Trumetrix Lancets 33G Use to test blood sugar Twice daily. Dx: E11.49   AMBULATORY NON FORMULARY MEDICATION    Trumetrix Test Strips Use to test blood sugar twice daily. Dx E11.49   AMLODIPINE (NORVASC)  10 MG TABLET    Take 1 tablet (10 mg total) by mouth daily.   ASPIRIN EC 81 MG TABLET    Take 1 tablet (81 mg total) by mouth daily.   ATENOLOL (TENORMIN) 25 MG TABLET    take 1 tablet by mouth once daily for migraines   BLOOD GLUCOSE MONITORING SUPPL (TRUE METRIX AIR GLUCOSE METER) W/DEVICE KIT    CHECK BLOOD SUGAR TWICE DAILY   CHOLECALCIFEROL (VITAMIN D3) 5000 UNITS TABS    Take by mouth daily.   CLOBETASOL CREAM (TEMOVATE) 0.05 %    Apply 1 application topically at bedtime.    DICLOFENAC SODIUM (VOLTAREN) 1 % GEL    Apply 4 g topically 4 (four) times daily as needed.   GLUCOSE BLOOD (TRUE METRIX BLOOD GLUCOSE TEST) TEST STRIP    CHECK BLOOD SUGAR TWICE DAILY   HUMALOG MIX 75/25 KWIKPEN (75-25) 100 UNIT/ML KWIKPEN    INJECT 8 UNITS SUBCUTANEOUSLY TWICE DAILY (DISCARD AND BEGIN A NEW PEN AFTER 10 DAYS)   HYDROXYZINE (ATARAX/VISTARIL) 10 MG TABLET    Take 10 mg by mouth every 6 (six) hours as needed for itching.   INSULIN SYRINGE-NEEDLE U-100 (INSULIN SYRINGE .5CC/31GX5/16") 31G X 5/16" 0.5 ML MISC    1 each by Does not apply route 2 (two) times daily before a meal.   LATANOPROST (XALATAN) 0.005 % OPHTHALMIC SOLUTION    Place 1 drop into both eyes daily. Reported on 08/27/2015   LISINOPRIL (PRINIVIL,ZESTRIL) 40 MG TABLET    take 1 tablet by mouth once daily for blood pressure   LORATADINE (CLARITIN) 10 MG TABLET    Take 10 mg by mouth as needed.   OMEPRAZOLE (PRILOSEC) 40 MG CAPSULE    take 1 capsule by mouth once daily   PAROXETINE (PAXIL) 40 MG TABLET    take 1 tablet by mouth once daily   PRAVASTATIN (PRAVACHOL) 40 MG TABLET       ROSUVASTATIN (CRESTOR) 20 MG TABLET    Take 20 mg by mouth daily.   SUMATRIPTAN (IMITREX) 25 MG TABLET    1 tablet daily as needed for headache, May repeat in 2 hours if headache persists or recurs. Max of 2 tablets in 24 hours   TRAMADOL (ULTRAM) 50 MG TABLET    1 by mouth every 6 hours as needed   TRIAMCINOLONE CREAM (KENALOG) 0.1 %    Apply to affected area 2-3  times daily   TRUEPLUS LANCETS 33G MISC    CHECK BLOOD SUGAR TWICE DAILY   ZOLPIDEM (AMBIEN) 5 MG TABLET    Take 1 tablet (5 mg total) by mouth at bedtime as needed for sleep.  Modified Medications   No medications on file  Discontinued Medications   No medications on file     Physical Exam:  Vitals:   06/01/16 1013  BP: 134/80  Pulse: 84  Resp: 19  Temp: 97.5 F (36.4 C)  TempSrc: Oral  SpO2: 96%  Weight: 188 lb 6.4 oz (85.5 kg)  Height: _0  (1.626 m)   Body mass index is 32.34 kg/m.  Physical Exam  Constitutional: She is oriented to person, place, and time. She appears well-developed and well-nourished. No distress.  HENT:  Head: Normocephalic and atraumatic.  Right Ear: External ear normal.  Left Ear: External ear normal.  Eyes: Conjunctivae and EOM are normal. Pupils are equal, round, and reactive to light.  Cardiovascular: Normal rate, regular rhythm, normal heart sounds and intact distal pulses.   No murmur heard. Pulmonary/Chest: Effort normal and breath sounds normal. No respiratory distress. She has no wheezes. She exhibits no tenderness.  Musculoskeletal:  Right hip tenderness.  Neurological: She is alert and oriented to person, place, and time.  Skin: Skin is warm and dry.  Psychiatric: She has a normal mood and affect.    Labs reviewed: Basic Metabolic Panel:  Recent Labs  01/06/16 1207 01/13/16 1142 05/30/16 0910  NA 138 141 141  K 4.3 4.8 5.2  CL 107 111* 110  CO2 _1 GLUCOSE 41* 105* 131*  BUN 23 28* 33*  CREATININE 2.73* 2.89* 3.51*  CALCIUM 8.8 8.6 8.9  TSH 1.41  --   --    Liver Function Tests:  Recent Labs  01/06/16 1207 01/13/16 1142 05/30/16 0910  AST _2 ALT _3 ALKPHOS 130 122 103  BILITOT 0.5 0.3 0.5  PROT 6.1 5.9* 6.0*  ALBUMIN 3.2* 3.4* 3.3*   No results for input(s): LIPASE, AMYLASE in the last 8760 hours. No results for input(s): AMMONIA in the last 8760 hours. CBC:  Recent Labs   08/27/15 0937 01/06/16 1207  WBC 7.7 8.3  NEUTROABS 4.8 3,818  HGB  --  10.0*  HCT 33.7* 30.5*  MCV 86 87.4  PLT 278 295   Lipid Panel:  Recent Labs  08/27/15 0937 01/06/16 1207 05/30/16 0910  CHOL 270* 239* 218*  HDL 69 71 55  LDLCALC 165* 137* 130*  TRIG 182* 155* 163*  CHOLHDL 3.9 3.4 4.0   TSH:  Recent Labs  01/06/16 1207  TSH 1.41   A1C: Lab Results  Component Value Date   HGBA1C 5.5 05/30/2016     Assessment/Plan 1. Essential hypertension -remains uncontrolled, taking atenolol 25 mg daily  Despite 50 mg daily being ordered at last visit. Pt going to several pharmacies, MA called pharmacies on file to update medications.  -Continue diet modifications. Decrease fried and fatty foods. Avoid salt. Increase fluids.   2. 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 HB A1c= 5.5. Will stop insulin at this time due to risk of hypoglycemia. Start trajenta daily for glucose control.  -pt to cont to take blood sugars and record  3. Mixed hyperlipidemia -remains uncontrolled. Lipid panel shows elevated CHO=218, Trig=163, LDL=130.  Will decreased crestor to 10m due to impaired renal function.  Will add Repatha injectable for better conmtroll with her renal function.   4. Tobacco abuse -Ongoing, although reports decreased smoking to one cigarette a day.  -Encouraged to further cessation  6. Nonintractable migraine, unspecified migraine type - atenolol (TENORMIN) 50 MG tablet; Take 1 tablet (50 mg total) by mouth daily.  Dispense: 90 tablet; Refill: 0  7. Memory loss -pt does not bring medication to visit, memory loss thought to be due to depression, also with ETOH  abuse, advised to stop drinking wine but she is not willing to stop at this time.  - Ambulatory referral to [facility] to help with medication managment  Follow up in 3 weeks on blood pressure, blood sugar and  cholesterol  Jessina Marse K. Harle Battiest  [facility] [phone] 8 am - 5 pm) [phone] (after hours)

## 2016-06-01 NOTE — Patient Instructions (Addendum)
Make sure you are taking your Vit D daily Stop insulin today Tomorrow Start tradjenta 5 mg daily -- cont to take blood sugars twice daily-- bring to next visit  Increase atenolol to 50 mg daily- cont to take blood pressure and start recording heart rate-- bring to next viist  Decrease Crestor to 10 mg daily (because of kidney function)  We will start Repatha injection which will be given once every 2 weeks

## 2016-06-04 ENCOUNTER — Other Ambulatory Visit: Payer: Self-pay | Admitting: Nurse Practitioner

## 2016-06-07 ENCOUNTER — Other Ambulatory Visit: Payer: Self-pay

## 2016-06-07 NOTE — Telephone Encounter (Signed)
Received fax from [facility] [phone] and Repatha is APPROVED until 12/03/2016 Member ID#: [id number]

## 2016-06-07 NOTE — Patient Outreach (Addendum)
[facility]) Care Management  06/07/2016  [name] [birth year] [id number]  Telephonic Screening and Initial Assessment   Referral Date:  06/02/16 Source:  [name], NP, [facility] Senior Care Issue;  Patient needs assistance with medication management.  Patient has some memory loss and does not bring prescriptions to MD office appt's.   Subjective:   Patient reached and completed call.  Patient states she does not like to talk on the phone but will participate.   [address]  [place] [address] [phone])  Providers: PCP:  [name], NP, [facility] - last appt 06/01/16 Neurologist:  Dr. Ellouise Newer - last appt 05/23/16 Nephrologist: Insurance:  Salli Quarry  Psycho/Social: Patient lives in her home alone.  Patient hs a mother who is 74 and a brother in his 79's.  States she does not have a support system.  Mobility: Ambulates without difficulty.  Falls: more than 2 over the past year - no injuries.  Pain: none Anxiety / Depression: yes; states she has patience on a scale of 1-10 (5).  H/o memory loss  associated to depression/bipolar and Psychiatry recommended (per neurology assessment).  Safety: none Transportation: brother provides but only able to provide morning transportation.  Caregiver: none  Emergency Contact:  None; states she would have to call 911.  Advance Directive: patient has paperwork but has not completed.  DME:  BP cuff (prefers a talking BP reading but does not have this kind), eyeglasses. No scales.   Co-morbidities H/o DM, HTN,memory loss, bipolar, depression, migraine headaches, CKD.  H/o kidney function continues to decline but patient elects not to do dialysis.  Admissions: 0 ER visits: 0  HTN:  H/o patient did not increase Atenolol to 50 mg as ordered or prior visit.  Patient confirms she is now taking 50 mg tab.  States she gets nervous when she goes to the MD.  BP 134/80  06/01/2016 Weight 188 lb (85 kg) 06/01/2016 Height 64 in (163 cm) 06/01/2016 BMI 32.40 (Obese Class I) 06/01/2016  Lipid Panel completed 05/30/2016 HDL 55.000 05/30/2016 LDL 130.000 05/30/2016 Cholesterol, total 218.000 05/30/2016 Triglycerides 163.000 05/30/2016 A1C 5.500 05/30/2016 Glucose Random 131.000 05/30/2016  Medications:  Patient taking more than 15 medications  Co-pay cost issues: none  Prevnar (PCV13) 04/22/2015 Pneumovax (PP 01/12/2012 Flu Vaccine 01/06/2016 tDAP Vaccine 11/23/2008 H/o poly pharmacy.     Encounter Medications:  Outpatient Encounter Prescriptions as of 06/07/2016  Medication Sig  . ALPRAZolam (XANAX) 0.5 MG tablet TAKE 1 TABLET BY MOUTH TWICE DAILY IF NEEDED FOR ANXIETY  . AMBULATORY NON FORMULARY MEDICATION Trumetrix Lancets 33G Use to test blood sugar Twice daily. Dx: E11.49  . AMBULATORY NON FORMULARY MEDICATION Trumetrix Test Strips Use to test blood sugar twice daily. Dx E11.49  . amLODipine (NORVASC) 10 MG tablet Take 1 tablet (10 mg total) by mouth daily.  Marland Kitchen aspirin EC 81 MG tablet Take 1 tablet (81 mg total) by mouth daily.  Marland Kitchen atenolol (TENORMIN) 50 MG tablet Take 1 tablet (50 mg total) by mouth daily.  . Blood Glucose Monitoring Suppl (TRUE METRIX AIR GLUCOSE METER) w/Device KIT CHECK BLOOD SUGAR TWICE DAILY  . Cholecalciferol (VITAMIN D3) 5000 units TABS Take by mouth daily.  . clobetasol cream (TEMOVATE) 0.25 % Apply 1 application topically at bedtime.   . diclofenac sodium (VOLTAREN) 1 % GEL Apply 4 g topically 4 (four) times daily as needed.  . Evolocumab (REPATHA) 140 MG/ML SOSY Inject 1 Dose into the skin  every 14 (fourteen) days.  Marland Kitchen glucose blood (TRUE METRIX BLOOD GLUCOSE TEST) test strip CHECK BLOOD SUGAR TWICE DAILY  . HUMALOG MIX 75/25 KWIKPEN (75-25) 100 UNIT/ML Kwikpen INJECT 8 UNITS SUBCUTANEOUSLY TWICE DAILY (DISCARD AND BEGIN A NEW PEN AFTER 10 DAYS)  . hydrOXYzine (ATARAX/VISTARIL) 10 MG tablet Take 10 mg by mouth every 6 (six) hours as needed for  itching.  . Insulin Syringe-Needle U-100 (INSULIN SYRINGE .5CC/31GX5/16") 31G X 5/16" 0.5 ML MISC 1 each by Does not apply route 2 (two) times daily before a meal.  . latanoprost (XALATAN) 0.005 % ophthalmic solution Place 1 drop into both eyes daily. Reported on 08/27/2015  . linagliptin (TRADJENTA) 5 MG TABS tablet Take 1 tablet (5 mg total) by mouth daily.  Marland Kitchen loratadine (CLARITIN) 10 MG tablet Take 10 mg by mouth as needed.  Marland Kitchen omeprazole (PRILOSEC) 40 MG capsule take 1 capsule by mouth once daily  . PARoxetine (PAXIL) 40 MG tablet take 1 tablet by mouth once daily  . rosuvastatin (CRESTOR) 10 MG tablet Take 1 tablet (10 mg total) by mouth daily.  . SUMAtriptan (IMITREX) 25 MG tablet 1 tablet daily as needed for headache, May repeat in 2 hours if headache persists or recurs. Max of 2 tablets in 24 hours  . traMADol (ULTRAM) 50 MG tablet 1 by mouth every 6 hours as needed  . triamcinolone cream (KENALOG) 0.1 % Apply to affected area 2-3 times daily  . TRUEPLUS LANCETS 33G MISC CHECK BLOOD SUGAR TWICE DAILY  . zolpidem (AMBIEN) 5 MG tablet Take 1 tablet (5 mg total) by mouth at bedtime as needed for sleep.   No facility-administered encounter medications on file as of 06/07/2016.     Functional Status:  In your present state of health, do you have any difficulty performing the following activities: 06/07/2016 01/06/2016  Hearing? N N  Vision? N N  Difficulty concentrating or making decisions? Tempie Donning  Walking or climbing stairs? N N  Dressing or bathing? N N  Doing errands, shopping? Y N  Preparing Food and eating ? N -  Using the Toilet? N -  In the past six months, have you accidently leaked urine? N -  Do you have problems with loss of bowel control? N -  Managing your Medications? N -  Managing your Finances? N -  Housekeeping or managing your Housekeeping? N -  Some recent data might be hidden    Fall/Depression Screening: PHQ 2/9 Scores 06/07/2016 01/06/2016 01/06/2016 08/27/2015  09/07/2014 07/09/2014 05/14/2014  PHQ - 2 Score 0 _0 0 1 1  PHQ- 9 Score - _1 - - -    Fall Risk  06/07/2016 06/01/2016 05/23/2016 02/24/2016 01/13/2016  Falls in the past year? _2   Number falls in past yr: 2 or more 2 or more 2 or more 1 1  Injury with Fall? _3   Risk Factor Category  High Fall Risk - - - -  Risk for fall due to : History of fall(s);Medication side effect - - - -  Risk for fall due to (comments): - - - - -  Follow up Falls evaluation completed;Education provided;Falls prevention discussed - - - -    Preventives: Hearing: none Eyes: Opthalmologist: Dr. [name] Fitch last appt about one year ago - next appt 06/2016. Wears glasses  Colonoscopy 10/17/2010 Mammogram 09/13/2015  Plan:  Referral:  06/02/16 Screening, Initial Assessment: 06/07/16 Telephonic RN CM services:  06/07/16 Program:  HTN  06/07/16  HTN -RN CM encouraged to take education as ordered.  -RN CM provided education on symptoms of HBP and encouraged if patient takes med and gets BP under control that her anxiety feeling may also lessen as feelings of anxiety/agitation can be associated to HBP. -RN CM will continue to follow h/o poly pharmacy due to patient gave limited time for call today.  Advance Directives:  RN CM will continue to follow progress on completion.    [facility] Referral - on hold until patient agrees to referral. -RN CM will continue to assess h/o polypharmacy use and provide education and coaching.   RN CM advised in next [facility] scheduled contact call within next 30 days for monthly assessment and / or care coordination services as needed. RN CM advised to please notify MD of any changes in condition prior to scheduled appt's.   RN CM provided contact name and # [phone] or main office # [phone] and 24-hour nurse line # [phone].  RN CM confirmed patient is aware of 911 services for urgent emergency needs.  RN CM sent successful outreach letter and  [facility]  Introductory package. RN CM sent Physician Enrollment/Barriers Letter and Initial Assessment to Primary MD RN CM notified [place] Management Assistant: agreed to services/case opened.  [facility] CM Care Plan Problem One   Flowsheet Row Most Recent Value  Care Plan Problem One  HTN - uncontrolled.   Role Documenting the Problem One  Care Management Telephonic Coordinator  Care Plan for Problem One  Active  THN Long Term Goal (31-90 days)  Patient will report controlled and managed BP readings over the next 31-90 days.   Interventions for Problem One Long Term Goal  RN CM will provide education on HTN management over the next 31-90 days.   THN CM Short Term Goal #1 (0-30 days)  Patient will take BP medication as ordered over the next 30 days.   THN CM Short Term Goal #1 Start Date  06/07/16  Interventions for Short Term Goal #1  RN CM will continue to follow progress on medication self management skills over the next 30 days.     THN CM Care Plan Problem Two   Flowsheet Row Most Recent Value  Care Plan Problem Two  Medication non-adherence associated to possible memory issues.   Role Documenting the Problem Two  Care Management Telephonic Coordinator  Care Plan for Problem Two  Active  THN CM Short Term Goal #1 (0-30 days)  Patient will engage in discussion on memory and depression on next contact call over the next 30 days.   THN CM Short Term Goal #1 Start Date  06/07/16  Interventions for Short Term Goal #2   RN CM will continue to assess and provide coaching as needed over the next 30 days.     [facility] CM Care Plan Problem Three   Flowsheet Row Most Recent Value  Care Plan Problem Three  Advance Directives - none - has paper work but has not completed.   Role Documenting the Problem Three  Care Management Telephonic Coordinator  Care Plan for Problem Three  Active  THN CM Short Term Goal #1 (0-30 days)  Patient will discuss progress on Advance Directives over the next 30 days.   THN CM Short  Term Goal #1 Start Date  06/07/16  Interventions for Short Term Goal #1  RN CM will continue to assess and provide guidance as needed over the next 30 days.  Nathaneil Canary, BSN, RN, [place] Management Care Management Coordinator [phone] Direct [phone] Cell [phone] Office [phone] Fax [email or web address]

## 2016-06-13 DIAGNOSIS — Z961 Presence of intraocular lens: Secondary | ICD-10-CM | POA: Diagnosis not present

## 2016-06-13 DIAGNOSIS — H401132 Primary open-angle glaucoma, bilateral, moderate stage: Secondary | ICD-10-CM | POA: Diagnosis not present

## 2016-06-21 ENCOUNTER — Telehealth: Payer: Self-pay

## 2016-06-21 NOTE — Telephone Encounter (Signed)
I called patient to see if she started Repatha. Patient states she has not heard anything about Repatha since she left the office. Patient aware I will contact [place] to get a status update.  I called [facility] @ [phone] and was informed they reached out to patient on 06/14/16 wit no return call. Patient has a $8.30 co-pay, [facility] will contact patient again.

## 2016-06-21 NOTE — Telephone Encounter (Signed)
Patient called to inform me that [name] contacted her and she will mail them a money order for $8.35 tomorrow and they will mail her the medication

## 2016-06-21 NOTE — Telephone Encounter (Signed)
Received fax from [facility] and patient's copay is $8.35. [facility] will notify patient.

## 2016-06-22 DIAGNOSIS — E1129 Type 2 diabetes mellitus with other diabetic kidney complication: Secondary | ICD-10-CM | POA: Diagnosis not present

## 2016-06-22 DIAGNOSIS — N2581 Secondary hyperparathyroidism of renal origin: Secondary | ICD-10-CM | POA: Diagnosis not present

## 2016-06-22 DIAGNOSIS — Z6832 Body mass index (BMI) 32.0-32.9, adult: Secondary | ICD-10-CM | POA: Diagnosis not present

## 2016-06-22 DIAGNOSIS — N184 Chronic kidney disease, stage 4 (severe): Secondary | ICD-10-CM | POA: Diagnosis not present

## 2016-06-22 DIAGNOSIS — I129 Hypertensive chronic kidney disease with stage 1 through stage 4 chronic kidney disease, or unspecified chronic kidney disease: Secondary | ICD-10-CM | POA: Diagnosis not present

## 2016-06-22 DIAGNOSIS — Z720 Tobacco use: Secondary | ICD-10-CM | POA: Diagnosis not present

## 2016-06-22 DIAGNOSIS — D631 Anemia in chronic kidney disease: Secondary | ICD-10-CM | POA: Diagnosis not present

## 2016-06-22 DIAGNOSIS — N179 Acute kidney failure, unspecified: Secondary | ICD-10-CM | POA: Diagnosis not present

## 2016-06-26 ENCOUNTER — Ambulatory Visit (INDEPENDENT_AMBULATORY_CARE_PROVIDER_SITE_OTHER): Payer: Medicare HMO | Admitting: Nurse Practitioner

## 2016-06-26 ENCOUNTER — Encounter: Payer: Self-pay | Admitting: Nurse Practitioner

## 2016-06-26 VITALS — BP 122/90 | HR 68 | Temp 97.6°F | Resp 17 | Ht 64.0 in | Wt 184.2 lb

## 2016-06-26 DIAGNOSIS — E1149 Type 2 diabetes mellitus with other diabetic neurological complication: Secondary | ICD-10-CM

## 2016-06-26 DIAGNOSIS — E782 Mixed hyperlipidemia: Secondary | ICD-10-CM

## 2016-06-26 DIAGNOSIS — Z794 Long term (current) use of insulin: Secondary | ICD-10-CM | POA: Diagnosis not present

## 2016-06-26 DIAGNOSIS — I10 Essential (primary) hypertension: Secondary | ICD-10-CM

## 2016-06-26 DIAGNOSIS — K5904 Chronic idiopathic constipation: Secondary | ICD-10-CM | POA: Diagnosis not present

## 2016-06-26 MED ORDER — ZOLPIDEM TARTRATE 5 MG PO TABS: 5.0000 mg | ORAL_TABLET | Freq: Every evening | ORAL | 2 refills | Status: DC | PRN

## 2016-06-26 MED ORDER — LINAGLIPTIN 5 MG PO TABS: 5.0000 mg | ORAL_TABLET | Freq: Every day | ORAL | 3 refills | Status: DC

## 2016-06-26 NOTE — Patient Instructions (Addendum)
Start colace 100 mg daily for stool softener  May use miralax 17 gm daily as needed for laxative  Take blood pressure AFTER medication   Constipation, Adult Constipation is when a person has fewer bowel movements in a week than normal, has difficulty having a bowel movement, or has stools that are dry, hard, or larger than normal. Constipation may be caused by an underlying condition. It may become worse with age if a person takes certain medicines and does not take in enough fluids. Follow these instructions at home: Eating and drinking    Eat foods that have a lot of fiber, such as fresh fruits and vegetables, whole grains, and beans.  Limit foods that are high in fat, low in fiber, or overly processed, such as french fries, hamburgers, cookies, candies, and soda.  Drink enough fluid to keep your urine clear or pale yellow. General instructions   Exercise regularly or as told by your health care provider.  Go to the restroom when you have the urge to go. Do not hold it in.  Take over-the-counter and prescription medicines only as told by your health care provider. These include any fiber supplements.  Practice pelvic floor retraining exercises, such as deep breathing while relaxing the lower abdomen and pelvic floor relaxation during bowel movements.  Watch your condition for any changes.  Keep all follow-up visits as told by your health care provider. This is important. Contact a health care provider if:  You have pain that gets worse.  You have a fever.  You do not have a bowel movement after 4 days.  You vomit.  You are not hungry.  You lose weight.  You are bleeding from the anus.  You have thin, pencil-like stools. Get help right away if:  You have a fever and your symptoms suddenly get worse.  You leak stool or have blood in your stool.  Your abdomen is bloated.  You have severe pain in your abdomen.  You feel dizzy or you faint. This information is  not intended to replace advice given to you by your health care provider. Make sure you discuss any questions you have with your health care provider. Document Released: 01/07/2004 Document Revised: 10/29/2015 Document Reviewed: 09/29/2015 Elsevier Interactive Patient Education  2017 [employer].

## 2016-06-26 NOTE — Progress Notes (Signed)
Careteam: Patient Care Team: [name], NP as PCP - General (Nurse Practitioner) Woodroe Mode (Inactive) as Diabetes Educator [name], MD as Consulting Physician (Ophthalmology) Standley Brooking, RN as [place] Management  Advanced Directive information Does Patient Have a Medical Advance Directive?: No  Allergies  Allergen Reactions  . Lisinopril Other (See Comments)    Abnormal Kidney Function   . Morphine And Related     nauseated  . Pioglitazone     REACTION: Desquamation of skin of the palm  . Sulfonamide Derivatives     rash    Chief Complaint  Patient presents with  . Follow-up    Pt is being seen for 3 week follow up.      HPI: Patient is a 71 y.o. female seen in the office today for follow up on blood pressure. At last visit pt was not taking atenolol as prescribed. Called pharmacies to update prescription.  Pt now taking norvasc 10 mg daily and atenolol 50 mg daily  Has been taking blood pressure at home before she takes her medication so naturally they are elevated.  Has stopped taking insulin. Now taking tradjenta 5 mg daily  Blood sugars [phone] No hypoglycemic episodes  Was started on repatha and insurance is covering this but she has not picked up yet.      Review of Systems:  Review of Systems  Constitutional: Negative for activity change, appetite change, chills, fatigue and fever.  HENT: Negative for congestion, postnasal drip, rhinorrhea, sinus pain, sinus pressure, sneezing and sore throat.   Respiratory: Negative for cough, chest tightness, shortness of breath and wheezing.   Cardiovascular: Negative for chest pain, palpitations and leg swelling.  Gastrointestinal: Positive for constipation. Negative for abdominal pain.  Endocrine: Negative for polydipsia, polyphagia and polyuria.  Neurological: Negative for dizziness, syncope, light-headedness and headaches.    Past  Medical History:  Diagnosis Date  . Anxiety   . Arthritis    lower back and knees   . Asthma    childhood  . Barrett's esophagus   . Depression   . Diabetes mellitus   . Dysphagia   . Eczema   . History of herpes zoster 02/2010   Recovered fully after period of acute herpetic neuralgia tx w/ gabapentin.   Marland Kitchen Hx MRSA infection 2009  . Hyperlipidemia   . Hypertension   . Neuromuscular disorder (HCC)    chronic pain  . Personal history of colonic polyps 10/17/2010   hyperplastic   Past Surgical History:  Procedure Laterality Date  . ABDOMINAL HYSTERECTOMY     unclear when  . CATARACT EXTRACTION Bilateral   . thigh surg     left side due to MRSA   Social History:   reports that she has been smoking Cigarettes.  She has a 10.00 pack-year smoking history. She has never used smokeless tobacco. She reports that she drinks about 1.2 oz of alcohol per week . She reports that she does not use drugs.  Family History  Problem Relation Age of Onset  . Hypertension Father   . Kidney disease Father   . Diabetes Brother   . Colon cancer Neg Hx   . CAD Neg Hx     Medications: Patient's Medications  New Prescriptions   No medications on file  Previous Medications   ALPRAZOLAM (XANAX) 0.5 MG TABLET    TAKE 1 TABLET BY MOUTH TWICE DAILY IF NEEDED FOR ANXIETY   AMBULATORY NON FORMULARY MEDICATION  Trumetrix Test Strips Use to test blood sugar twice daily. Dx E11.49   AMLODIPINE (NORVASC) 10 MG TABLET    Take 1 tablet (10 mg total) by mouth daily.   ASPIRIN EC 81 MG TABLET    Take 1 tablet (81 mg total) by mouth daily.   ATENOLOL (TENORMIN) 50 MG TABLET    Take 1 tablet (50 mg total) by mouth daily.   BLOOD GLUCOSE MONITORING SUPPL (TRUE METRIX AIR GLUCOSE METER) W/DEVICE KIT    CHECK BLOOD SUGAR TWICE DAILY   CHOLECALCIFEROL (VITAMIN D3) 5000 UNITS TABS    Take by mouth daily.   CLOBETASOL CREAM (TEMOVATE) 0.05 %    Apply 1 application topically at bedtime.    DICLOFENAC SODIUM  (VOLTAREN) 1 % GEL    Apply 4 g topically 4 (four) times daily as needed.   EVOLOCUMAB (REPATHA) 140 MG/ML SOSY    Inject 1 Dose into the skin every 14 (fourteen) days.   GLUCOSE BLOOD (TRUE METRIX BLOOD GLUCOSE TEST) TEST STRIP    CHECK BLOOD SUGAR TWICE DAILY   HYDROXYZINE (ATARAX/VISTARIL) 10 MG TABLET    Take 10 mg by mouth every 6 (six) hours as needed for itching.   LATANOPROST (XALATAN) 0.005 % OPHTHALMIC SOLUTION    Place 1 drop into both eyes daily. Reported on 08/27/2015   LINAGLIPTIN (TRADJENTA) 5 MG TABS TABLET    Take 1 tablet (5 mg total) by mouth daily.   LORATADINE (CLARITIN) 10 MG TABLET    Take 10 mg by mouth as needed.   OMEPRAZOLE (PRILOSEC) 40 MG CAPSULE    take 1 capsule by mouth once daily   PAROXETINE (PAXIL) 40 MG TABLET    take 1 tablet by mouth once daily   ROSUVASTATIN (CRESTOR) 10 MG TABLET    Take 1 tablet (10 mg total) by mouth daily.   SUMATRIPTAN (IMITREX) 25 MG TABLET    1 tablet daily as needed for headache, May repeat in 2 hours if headache persists or recurs. Max of 2 tablets in 24 hours   TRAMADOL (ULTRAM) 50 MG TABLET    1 by mouth every 6 hours as needed   TRIAMCINOLONE CREAM (KENALOG) 0.1 %    Apply to affected area 2-3 times daily   TRUEPLUS LANCETS 33G MISC    CHECK BLOOD SUGAR TWICE DAILY   ZOLPIDEM (AMBIEN) 5 MG TABLET    Take 1 tablet (5 mg total) by mouth at bedtime as needed for sleep.  Modified Medications   No medications on file  Discontinued Medications   AMBULATORY NON FORMULARY MEDICATION    Trumetrix Lancets 33G Use to test blood sugar Twice daily. Dx: E11.49   HUMALOG MIX 75/25 KWIKPEN (75-25) 100 UNIT/ML KWIKPEN    INJECT 8 UNITS SUBCUTANEOUSLY TWICE DAILY (DISCARD AND BEGIN A NEW PEN AFTER 10 DAYS)   INSULIN SYRINGE-NEEDLE U-100 (INSULIN SYRINGE .5CC/31GX5/16") 31G X 5/16" 0.5 ML MISC    1 each by Does not apply route 2 (two) times daily before a meal.     Physical Exam:  Vitals:   06/26/16 0953 06/26/16 1014  BP: (!) 142/84 122/90    Pulse: 68   Resp: 17   Temp: 97.6 F (36.4 C)   TempSrc: Oral   SpO2: 97%   Weight: 184 lb 3.2 oz (83.6 kg)   Height: 5' 4" (1.626 m)    Body mass index is 31.62 kg/m.  Physical Exam  Constitutional: She is oriented to person, place, and time. She appears well-developed  and well-nourished. No distress.  HENT:  Head: Normocephalic and atraumatic.  Right Ear: External ear normal.  Left Ear: External ear normal.  Eyes: Conjunctivae and EOM are normal. Pupils are equal, round, and reactive to light.  Cardiovascular: Normal rate, regular rhythm and normal heart sounds.   No murmur heard. Pulmonary/Chest: Effort normal and breath sounds normal. No respiratory distress. She has no wheezes. She exhibits no tenderness.  Neurological: She is alert and oriented to person, place, and time.  Skin: Skin is warm and dry.  Psychiatric: She has a normal mood and affect.    Labs reviewed: Basic Metabolic Panel:  Recent Labs  01/06/16 1207 01/13/16 1142 05/30/16 0910  NA 138 141 141  K 4.3 4.8 5.2  CL 107 111* 110  CO2 _0 GLUCOSE 41* 105* 131*  BUN 23 28* 33*  CREATININE 2.73* 2.89* 3.51*  CALCIUM 8.8 8.6 8.9  TSH 1.41  --   --    Liver Function Tests:  Recent Labs  01/06/16 1207 01/13/16 1142 05/30/16 0910  AST _1 ALT _2 ALKPHOS 130 122 103  BILITOT 0.5 0.3 0.5  PROT 6.1 5.9* 6.0*  ALBUMIN 3.2* 3.4* 3.3*   No results for input(s): LIPASE, AMYLASE in the last 8760 hours. No results for input(s): AMMONIA in the last 8760 hours. CBC:  Recent Labs  08/27/15 0937 01/06/16 1207  WBC 7.7 8.3  NEUTROABS 4.8 3,818  HGB  --  10.0*  HCT 33.7* 30.5*  MCV 86 87.4  PLT 278 295   Lipid Panel:  Recent Labs  08/27/15 0937 01/06/16 1207 05/30/16 0910  CHOL 270* 239* 218*  HDL 69 71 55  LDLCALC 165* 137* 130*  TRIG 182* 155* 163*  CHOLHDL 3.9 3.4 4.0   TSH:  Recent Labs  01/06/16 1207  TSH 1.41   A1C: Lab Results  Component Value Date    HGBA1C 5.5 05/30/2016     Assessment/Plan 1. Essential hypertension Improved on recheck,educated to check blood pressure after she has taken medication. Low sodium diet.  -cont atenolol and novasc.  2. 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 Mixed hyperlipidemia -conts on crestor, repatha has been added and pt plans to start as soon as she receives medication - CMP with eGFR; Future - Lipid Panel; Future  4. Chronic idiopathic constipation -increase water intake -colace 100 mg by mouth daily for stool softener, can use miralax 17 gm daily as needed  Jessica K. Harle Battiest  [facility] [phone] 8 am - 5 pm) [phone] (after hours)

## 2016-06-27 ENCOUNTER — Other Ambulatory Visit: Payer: Self-pay | Admitting: Nurse Practitioner

## 2016-06-27 DIAGNOSIS — F418 Other specified anxiety disorders: Secondary | ICD-10-CM

## 2016-07-03 ENCOUNTER — Other Ambulatory Visit (HOSPITAL_COMMUNITY): Payer: Self-pay | Admitting: Nephrology

## 2016-07-03 DIAGNOSIS — N179 Acute kidney failure, unspecified: Secondary | ICD-10-CM

## 2016-07-03 DIAGNOSIS — N189 Chronic kidney disease, unspecified: Principal | ICD-10-CM

## 2016-07-03 DIAGNOSIS — Z992 Dependence on renal dialysis: Principal | ICD-10-CM

## 2016-07-05 ENCOUNTER — Ambulatory Visit: Payer: Self-pay

## 2016-07-06 ENCOUNTER — Ambulatory Visit: Payer: Self-pay

## 2016-07-07 ENCOUNTER — Other Ambulatory Visit: Payer: Self-pay

## 2016-07-07 NOTE — Patient Outreach (Signed)
[facility]) Care Management  07/07/2016  [name] September 22, 1945 [id number]  Telephonic Monthly Assessment   Referral Date:  06/02/16 Source:  [name], NP, [facility] Issue;  Patient needs assistance with medication management.  Patient has some memory loss and does not bring prescriptions to MD office appt's.   Screening, Initial Assessment: 06/07/16 Telephonic RN CM services:  06/07/16 Program:  HTN  06/07/16  [place] [address] [phone] (H) [phone] (M)  Outreach #1 to patient for telephonic monthly assessment.   H/o Patient does not like to talk on the phone but agreed to participate in [facility] program 06/07/16. Patient not reached.   RN CM left HIPAA compliant voice message with name and number for call back.   RN CM scheduled for next outreach call within one week.    Nathaneil Canary, BSN, RN, [place] Management Care Management Coordinator [phone] Direct [phone] Cell [phone] Office [phone] Fax [email or web address]

## 2016-07-10 ENCOUNTER — Ambulatory Visit (INDEPENDENT_AMBULATORY_CARE_PROVIDER_SITE_OTHER): Payer: Medicare HMO | Admitting: Nurse Practitioner

## 2016-07-10 ENCOUNTER — Other Ambulatory Visit: Payer: Self-pay

## 2016-07-10 ENCOUNTER — Encounter: Payer: Self-pay | Admitting: Nurse Practitioner

## 2016-07-10 VITALS — BP 162/96 | HR 73 | Temp 97.5°F | Resp 18 | Ht 64.0 in | Wt 182.6 lb

## 2016-07-10 DIAGNOSIS — T148XXA Other injury of unspecified body region, initial encounter: Secondary | ICD-10-CM | POA: Diagnosis not present

## 2016-07-10 DIAGNOSIS — E782 Mixed hyperlipidemia: Secondary | ICD-10-CM

## 2016-07-10 NOTE — Patient Outreach (Signed)
[facility]) Care Management  07/10/2016  [name] 1945/12/19 [id number]  Telephonic Screening and Initial Assessment   Referral Date:  06/02/16 Source:  [name], NP, [facility] Issue:  Patient needs assistance with medication management.  Patient has some memory loss and does not bring prescriptions to MD office appt's.   Subjective:   Patient reached and completed call.  H/o patient does not like to talk on the phone but will participate.   [address]  [place] [address] [phone])  Providers: PCP:  [name], NP, [facility] - last appt 07/10/16 Neurologist:  Dr. Ellouise Newer - last appt 05/23/16 Nephrologist: Insurance:  Salli Quarry  Psycho/Social: Patient lives in her home alone.  Patient hs a mother who is 87 and a brother in his 52's and does not have a support system.  Mobility: Ambulates without difficulty.  Falls: more than 2 over the past year - no injuries.  Pain: none Anxiety / Depression: yes; states she has patience on a scale of 1-10 (5).  H/o memory loss  associated to depression/bipolar and Psychiatry recommended (per neurology assessment).  Safety: none Transportation: brother provides but only able to provide morning transportation.  Caregiver: none  Emergency Contact:  None; states she would have to call 911.  Advance Directive: patient has paperwork but has not completed.  DME:  BP cuff (prefers a talking BP reading but does not have this kind), eyeglasses. No scales.   Co-morbidities DM, HTN,memory loss, bipolar, depression, migraine headaches, CKD.  H/o kidney function continues to decline but patient elects not to do dialysis.  Admissions: 0 ER visits: 0  HTN:  Patient states she just came from her MD appt and has been very rushed this morning.  BP elevated on office visit 162/96.  Patient states she has not taken her BP medication yet this morning because she came home  and then had to walk her dog.  States she gets nervous when she goes to the MD and was rushed.   BP  162/96 07/10/16 (MD office appt) 122/90 06/26/16 134/80 06/01/16  Weight  182 lb 07/10/16 184 lb 06/26/16 188 lb  06/01/16 189 lb 05/23/16  Lipid Panel completed 05/30/2016 HDL 55.000 05/30/2016 LDL 130.000 05/30/2016 Cholesterol, total 218.000 05/30/2016 Triglycerides 163.000 05/30/2016 A1C 5.500 05/30/2016 Glucose Random 131.000 05/30/2016  Medications:  Patient taking more than 15 medications  Co-pay cost issues: none  Prevnar (PCV13) 04/22/2015 Pneumovax (PP 01/12/2012 Flu Vaccine 01/06/2016 tDAP Vaccine 11/23/2008 H/o poly pharmacy.    Encounter Medications:  Outpatient Encounter Prescriptions as of 07/10/2016  Medication Sig  . ALPRAZolam (XANAX) 0.5 MG tablet take 1 tablet by mouth twice a day if needed for anxiety  . AMBULATORY NON FORMULARY MEDICATION Trumetrix Test Strips Use to test blood sugar twice daily. Dx E11.49  . aspirin EC 81 MG tablet Take 1 tablet (81 mg total) by mouth daily.  Marland Kitchen atenolol (TENORMIN) 50 MG tablet Take 1 tablet (50 mg total) by mouth daily.  . Blood Glucose Monitoring Suppl (TRUE METRIX AIR GLUCOSE METER) w/Device KIT CHECK BLOOD SUGAR TWICE DAILY  . Cholecalciferol (VITAMIN D3) 5000 units TABS Take by mouth daily.  . clobetasol cream (TEMOVATE) 8.76 % Apply 1 application topically at bedtime.   . Evolocumab (REPATHA) 140 MG/ML SOSY Inject 1 Dose into the skin every 14 (fourteen) days.  Marland Kitchen glucose blood (TRUE METRIX BLOOD GLUCOSE TEST) test strip CHECK BLOOD SUGAR TWICE DAILY  . hydrOXYzine (  ATARAX/VISTARIL) 10 MG tablet Take 10 mg by mouth every 6 (six) hours as needed for itching.  . latanoprost (XALATAN) 0.005 % ophthalmic solution Place 1 drop into both eyes daily. Reported on 08/27/2015  . linagliptin (TRADJENTA) 5 MG TABS tablet Take 1 tablet (5 mg total) by mouth daily.  Marland Kitchen loratadine (CLARITIN) 10 MG tablet Take 10 mg by mouth as needed.  Marland Kitchen omeprazole  (PRILOSEC) 40 MG capsule take 1 capsule by mouth once daily  . PARoxetine (PAXIL) 40 MG tablet take 1 tablet by mouth once daily  . rosuvastatin (CRESTOR) 10 MG tablet Take 1 tablet (10 mg total) by mouth daily.  . SUMAtriptan (IMITREX) 25 MG tablet 1 tablet daily as needed for headache, May repeat in 2 hours if headache persists or recurs. Max of 2 tablets in 24 hours  . traMADol (ULTRAM) 50 MG tablet 1 by mouth every 6 hours as needed  . triamcinolone cream (KENALOG) 0.1 % Apply to affected area 2-3 times daily  . TRUEPLUS LANCETS 33G MISC CHECK BLOOD SUGAR TWICE DAILY  . zolpidem (AMBIEN) 5 MG tablet Take 1 tablet (5 mg total) by mouth at bedtime as needed for sleep.  Marland Kitchen amLODipine (NORVASC) 10 MG tablet Take 1 tablet (10 mg total) by mouth daily. (Patient not taking: Reported on 07/10/2016)  . diclofenac sodium (VOLTAREN) 1 % GEL Apply 4 g topically 4 (four) times daily as needed. (Patient not taking: Reported on 07/10/2016)   No facility-administered encounter medications on file as of 07/10/2016.     Functional Status:  In your present state of health, do you have any difficulty performing the following activities: 06/07/2016 01/06/2016  Hearing? N N  Vision? N N  Difficulty concentrating or making decisions? Tempie Donning  Walking or climbing stairs? N N  Dressing or bathing? N N  Doing errands, shopping? Y N  Preparing Food and eating ? N -  Using the Toilet? N -  In the past six months, have you accidently leaked urine? N -  Do you have problems with loss of bowel control? N -  Managing your Medications? N -  Managing your Finances? N -  Housekeeping or managing your Housekeeping? N -  Some recent data might be hidden    Fall/Depression Screening: PHQ 2/9 Scores 06/07/2016 01/06/2016 01/06/2016 08/27/2015 09/07/2014 07/09/2014 05/14/2014  PHQ - 2 Score 0 3 2 3  0 1 1  PHQ- 9 Score - 8 10 14  - - -    Fall Risk  07/10/2016 06/26/2016 06/07/2016 06/01/2016 05/23/2016  Falls in the past year? No No Yes  Yes Yes  Number falls in past yr: - - 2 or more 2 or more 2 or more  Injury with Fall? - - No No No  Risk Factor Category  - - High Fall Risk - -  Risk for fall due to : - - History of fall(s);Medication side effect - -  Risk for fall due to (comments): - - - - -  Follow up - - Falls evaluation completed;Education provided;Falls prevention discussed - -    Preventives: Hearing: none Eyes: Opthalmologist: Dr. [name] Fitch last appt about one year ago - next appt 06/2016. Wears glasses  Colonoscopy 10/17/2010 Mammogram 09/13/2015  Assessment / Plan:  Referral:  06/02/16 Screening, Initial Assessment: 06/07/16 Telephonic RN CM services:  06/07/16 Program:  HTN  06/07/16  HTN -RN CM encouraged to take medications as prescribed and not to miss any doses.  -RN CM discussed the importance of taking BP  medication every day and at the same time of day.   -RN CM provided education on symptoms of HBP and encouraged if patient takes med and gets BP under control that her anxiety feeling may also lessen as feelings of anxiety/agitation can be associated to HBP. -RN CM discussed low salt diet and weight management; praised patient for decreased weight loss.  -RN CM encouraged to replace pickles with fresh fruit or vegetables.   [facility] Education (mailed 07/10/16 -Controlling Your Blood Pressure Through Lifestyle -High Blood Pressure (Hypertension):  Health Problems -High Blood Pressure (Hypertension):  Taking Your Blood Pressure -High Blood Pressure (Hypertension):  What You Can Do -High Blood Pressure Emergencies -High Blood Pressure In Adults -Medications For High Blood Pressure (Hypertension -Low Salt Diet -Dash Diet  -How To Read a Food Label Chart [facility] Calendar  Advance Directives:  Patient has paperwork but has not completed.  RN CM will continue to follow progress on completion.    [facility] Referral - on hold until patient agrees to referral. H/o polypharmacy use and provide education and  coaching.  RN CM will continue to assess   RN CM advised in next [facility] scheduled contact call within next 30 days for monthly assessment and / or care coordination services as needed. RN CM advised to please notify MD of any changes in condition prior to scheduled appt's.   RN CM provided contact name and # [phone] or main office # [phone] and 24-hour nurse line # [phone].  RN CM confirmed patient is aware of 911 services for urgent emergency needs.  Nathaneil Canary, BSN, RN, [place] Management Care Management Coordinator [phone] Direct [phone] Cell [phone] Office [phone] Fax [email or web address]

## 2016-07-10 NOTE — Progress Notes (Signed)
Careteam: Patient Care Team: [name], NP as PCP - General (Nurse Practitioner) Woodroe Mode (Inactive) as Diabetes Educator [name], MD as Consulting Physician (Ophthalmology) Standley Brooking, RN as [place] Management  Advanced Directive information Does Patient Have a Medical Advance Directive?: No  Allergies  Allergen Reactions  . Lisinopril Other (See Comments)    Abnormal Kidney Function   . Morphine And Related     nauseated  . Pioglitazone     REACTION: Desquamation of skin of the palm  . Sulfonamide Derivatives     rash    Chief Complaint  Patient presents with  . Acute Visit    Pt is being seen to discuss Repatha and due to stepping on the needle of a shirt pin about a week ago.      HPI: Patient is a 71 y.o. female seen in the office today due to questions about repatha administration. Pt has her medications delivered to her home.  Stepped on a button with a pin on the back on it over a week ago.  No pain or tenderness noted. No redness or drainage. Would like it to be looked at. No foreign body left in foot. Able to walk without problem.  Blood pressure up because she did not take any of her medication this morning.   Review of Systems:  Review of Systems  Constitutional: Negative for activity change, appetite change, chills, fatigue and fever.  HENT: Negative for congestion.   Respiratory: Negative for cough, chest tightness and shortness of breath.   Cardiovascular: Negative for chest pain, palpitations and leg swelling.  Endocrine: Negative for polydipsia, polyphagia and polyuria.  Skin: Negative for color change, pallor, rash and wound.  Neurological: Negative for dizziness, syncope, light-headedness and headaches.    Past Medical History:  Diagnosis Date  . Anxiety   . Arthritis    lower back and knees   . Asthma    childhood  . Barrett's esophagus   . Depression   . Diabetes mellitus   .  Dysphagia   . Eczema   . History of herpes zoster 02/2010   Recovered fully after period of acute herpetic neuralgia tx w/ gabapentin.   Marland Kitchen Hx MRSA infection 2009  . Hyperlipidemia   . Hypertension   . Neuromuscular disorder (HCC)    chronic pain  . Personal history of colonic polyps 10/17/2010   hyperplastic   Past Surgical History:  Procedure Laterality Date  . ABDOMINAL HYSTERECTOMY     unclear when  . CATARACT EXTRACTION Bilateral   . thigh surg     left side due to MRSA   Social History:   reports that she has been smoking Cigarettes.  She has a 10.00 pack-year smoking history. She has never used smokeless tobacco. She reports that she drinks about 1.2 oz of alcohol per week . She reports that she does not use drugs.  Family History  Problem Relation Age of Onset  . Hypertension Father   . Kidney disease Father   . Diabetes Brother   . Colon cancer Neg Hx   . CAD Neg Hx     Medications: Patient's Medications  New Prescriptions   No medications on file  Previous Medications   ALPRAZOLAM (XANAX) 0.5 MG TABLET    take 1 tablet by mouth twice a day if needed for anxiety   AMBULATORY NON FORMULARY MEDICATION    Trumetrix Test Strips Use to test blood sugar twice  daily. Dx E11.49   AMLODIPINE (NORVASC) 10 MG TABLET    Take 1 tablet (10 mg total) by mouth daily.   ASPIRIN EC 81 MG TABLET    Take 1 tablet (81 mg total) by mouth daily.   ATENOLOL (TENORMIN) 50 MG TABLET    Take 1 tablet (50 mg total) by mouth daily.   BLOOD GLUCOSE MONITORING SUPPL (TRUE METRIX AIR GLUCOSE METER) W/DEVICE KIT    CHECK BLOOD SUGAR TWICE DAILY   CHOLECALCIFEROL (VITAMIN D3) 5000 UNITS TABS    Take by mouth daily.   CLOBETASOL CREAM (TEMOVATE) 0.05 %    Apply 1 application topically at bedtime.    DICLOFENAC SODIUM (VOLTAREN) 1 % GEL    Apply 4 g topically 4 (four) times daily as needed.   EVOLOCUMAB (REPATHA) 140 MG/ML SOSY    Inject 1 Dose into the skin every 14 (fourteen) days.   GLUCOSE  BLOOD (TRUE METRIX BLOOD GLUCOSE TEST) TEST STRIP    CHECK BLOOD SUGAR TWICE DAILY   HYDROXYZINE (ATARAX/VISTARIL) 10 MG TABLET    Take 10 mg by mouth every 6 (six) hours as needed for itching.   LATANOPROST (XALATAN) 0.005 % OPHTHALMIC SOLUTION    Place 1 drop into both eyes daily. Reported on 08/27/2015   LINAGLIPTIN (TRADJENTA) 5 MG TABS TABLET    Take 1 tablet (5 mg total) by mouth daily.   LORATADINE (CLARITIN) 10 MG TABLET    Take 10 mg by mouth as needed.   OMEPRAZOLE (PRILOSEC) 40 MG CAPSULE    take 1 capsule by mouth once daily   PAROXETINE (PAXIL) 40 MG TABLET    take 1 tablet by mouth once daily   ROSUVASTATIN (CRESTOR) 10 MG TABLET    Take 1 tablet (10 mg total) by mouth daily.   SUMATRIPTAN (IMITREX) 25 MG TABLET    1 tablet daily as needed for headache, May repeat in 2 hours if headache persists or recurs. Max of 2 tablets in 24 hours   TRAMADOL (ULTRAM) 50 MG TABLET    1 by mouth every 6 hours as needed   TRIAMCINOLONE CREAM (KENALOG) 0.1 %    Apply to affected area 2-3 times daily   TRUEPLUS LANCETS 33G MISC    CHECK BLOOD SUGAR TWICE DAILY   ZOLPIDEM (AMBIEN) 5 MG TABLET    Take 1 tablet (5 mg total) by mouth at bedtime as needed for sleep.  Modified Medications   No medications on file  Discontinued Medications   No medications on file     Physical Exam:  Vitals:   07/10/16 0913  BP: (!) 162/96  Pulse: 73  Resp: 18  Temp: 97.5 F (36.4 C)  TempSrc: Oral  SpO2: 96%  Weight: 182 lb 9.6 oz (82.8 kg)  Height: '5\' 4"'$  (1.626 m)   Body mass index is 31.34 kg/m.  Physical Exam  Constitutional: She is oriented to person, place, and time. She appears well-developed and well-nourished. No distress.  Eyes: Conjunctivae and EOM are normal. Pupils are equal, round, and reactive to light.  Cardiovascular: Normal rate, regular rhythm and normal heart sounds.   No murmur heard. Pulmonary/Chest: Effort normal and breath sounds normal. No respiratory distress. She has no  wheezes.  Abdominal: Soft. Bowel sounds are normal.  Neurological: She is alert and oriented to person, place, and time.  Skin: Skin is warm and dry.  Small pinprick hole to plantar aspect of left foot. No warmth, drainage, erythema, pain noted.   Psychiatric: She  has a normal mood and affect.   Labs reviewed: Basic Metabolic Panel:  Recent Labs  01/06/16 1207 01/13/16 1142 05/30/16 0910  NA 138 141 141  K 4.3 4.8 5.2  CL 107 111* 110  CO2 _0 GLUCOSE 41* 105* 131*  BUN 23 28* 33*  CREATININE 2.73* 2.89* 3.51*  CALCIUM 8.8 8.6 8.9  TSH 1.41  --   --    Liver Function Tests:  Recent Labs  01/06/16 1207 01/13/16 1142 05/30/16 0910  AST _1 ALT _2 ALKPHOS 130 122 103  BILITOT 0.5 0.3 0.5  PROT 6.1 5.9* 6.0*  ALBUMIN 3.2* 3.4* 3.3*   No results for input(s): LIPASE, AMYLASE in the last 8760 hours. No results for input(s): AMMONIA in the last 8760 hours. CBC:  Recent Labs  08/27/15 0937 01/06/16 1207  WBC 7.7 8.3  NEUTROABS 4.8 3,818  HGB  --  10.0*  HCT 33.7* 30.5*  MCV 86 87.4  PLT 278 295   Lipid Panel:  Recent Labs  08/27/15 0937 01/06/16 1207 05/30/16 0910  CHOL 270* 239* 218*  HDL 69 71 55  LDLCALC 165* 137* 130*  TRIG 182* 155* 163*  CHOLHDL 3.9 3.4 4.0   TSH:  Recent Labs  01/06/16 1207  TSH 1.41   A1C: Lab Results  Component Value Date   HGBA1C 5.5 05/30/2016     Assessment/Plan 1. Mixed hyperlipidemia Instructed on administration for Repatha and medication given in office.   2. Puncture site Resolved, no signs of complications  Lakeidra Reliford K. Harle Battiest  [facility] [phone] 8 am - 5 pm) [phone] (after hours)

## 2016-07-10 NOTE — Patient Instructions (Addendum)
You took dose of REPATHA today in office on 3/19 NEXT DOSE of repatha is due April 2nd

## 2016-07-11 ENCOUNTER — Telehealth: Payer: Self-pay | Admitting: *Deleted

## 2016-07-11 NOTE — Telephone Encounter (Signed)
Patient called and left message on Clinical Intake line stating she just wanted to let you know that she stepped in a hole in her yard this morning.   I tried calling her back to make sure she was ok and if there were any injury. Left message on voicemail to return call.

## 2016-07-13 ENCOUNTER — Other Ambulatory Visit: Payer: Self-pay | Admitting: Radiology

## 2016-07-14 ENCOUNTER — Encounter (HOSPITAL_COMMUNITY): Payer: Self-pay

## 2016-07-14 ENCOUNTER — Ambulatory Visit (HOSPITAL_COMMUNITY)
Admission: RE | Admit: 2016-07-14 | Discharge: 2016-07-14 | Disposition: A | Discharge status: Home / Self Care | Payer: Medicare HMO | Source: Ambulatory Visit | Attending: Nephrology | Admitting: Nephrology

## 2016-07-14 DIAGNOSIS — N189 Chronic kidney disease, unspecified: Secondary | ICD-10-CM | POA: Diagnosis not present

## 2016-07-14 DIAGNOSIS — K2270 Barrett's esophagus without dysplasia: Secondary | ICD-10-CM | POA: Insufficient documentation

## 2016-07-14 DIAGNOSIS — Z8614 Personal history of Methicillin resistant Staphylococcus aureus infection: Secondary | ICD-10-CM | POA: Insufficient documentation

## 2016-07-14 DIAGNOSIS — E785 Hyperlipidemia, unspecified: Secondary | ICD-10-CM | POA: Insufficient documentation

## 2016-07-14 DIAGNOSIS — N179 Acute kidney failure, unspecified: Secondary | ICD-10-CM | POA: Insufficient documentation

## 2016-07-14 DIAGNOSIS — J84112 Idiopathic pulmonary fibrosis: Secondary | ICD-10-CM | POA: Diagnosis not present

## 2016-07-14 DIAGNOSIS — Z833 Family history of diabetes mellitus: Secondary | ICD-10-CM | POA: Insufficient documentation

## 2016-07-14 DIAGNOSIS — Z86010 Personal history of colonic polyps: Secondary | ICD-10-CM | POA: Diagnosis not present

## 2016-07-14 DIAGNOSIS — Z8619 Personal history of other infectious and parasitic diseases: Secondary | ICD-10-CM | POA: Insufficient documentation

## 2016-07-14 DIAGNOSIS — Z841 Family history of disorders of kidney and ureter: Secondary | ICD-10-CM | POA: Insufficient documentation

## 2016-07-14 DIAGNOSIS — Z8249 Family history of ischemic heart disease and other diseases of the circulatory system: Secondary | ICD-10-CM | POA: Insufficient documentation

## 2016-07-14 DIAGNOSIS — F17210 Nicotine dependence, cigarettes, uncomplicated: Secondary | ICD-10-CM | POA: Insufficient documentation

## 2016-07-14 DIAGNOSIS — Z9841 Cataract extraction status, right eye: Secondary | ICD-10-CM | POA: Diagnosis not present

## 2016-07-14 DIAGNOSIS — E1122 Type 2 diabetes mellitus with diabetic chronic kidney disease: Secondary | ICD-10-CM | POA: Insufficient documentation

## 2016-07-14 DIAGNOSIS — D631 Anemia in chronic kidney disease: Secondary | ICD-10-CM | POA: Insufficient documentation

## 2016-07-14 DIAGNOSIS — I701 Atherosclerosis of renal artery: Secondary | ICD-10-CM | POA: Diagnosis not present

## 2016-07-14 DIAGNOSIS — Q851 Tuberous sclerosis: Secondary | ICD-10-CM | POA: Diagnosis not present

## 2016-07-14 DIAGNOSIS — Z9842 Cataract extraction status, left eye: Secondary | ICD-10-CM | POA: Diagnosis not present

## 2016-07-14 DIAGNOSIS — N269 Renal sclerosis, unspecified: Secondary | ICD-10-CM | POA: Diagnosis not present

## 2016-07-14 DIAGNOSIS — I129 Hypertensive chronic kidney disease with stage 1 through stage 4 chronic kidney disease, or unspecified chronic kidney disease: Secondary | ICD-10-CM | POA: Diagnosis not present

## 2016-07-14 DIAGNOSIS — E1121 Type 2 diabetes mellitus with diabetic nephropathy: Secondary | ICD-10-CM | POA: Insufficient documentation

## 2016-07-14 DIAGNOSIS — Z7982 Long term (current) use of aspirin: Secondary | ICD-10-CM | POA: Diagnosis not present

## 2016-07-14 DIAGNOSIS — E1129 Type 2 diabetes mellitus with other diabetic kidney complication: Secondary | ICD-10-CM | POA: Diagnosis not present

## 2016-07-14 DIAGNOSIS — Z800 Family history of malignant neoplasm of digestive organs: Secondary | ICD-10-CM | POA: Insufficient documentation

## 2016-07-14 DIAGNOSIS — Z882 Allergy status to sulfonamides status: Secondary | ICD-10-CM | POA: Diagnosis not present

## 2016-07-14 DIAGNOSIS — Z90710 Acquired absence of both cervix and uterus: Secondary | ICD-10-CM | POA: Diagnosis not present

## 2016-07-14 DIAGNOSIS — N2581 Secondary hyperparathyroidism of renal origin: Secondary | ICD-10-CM | POA: Diagnosis not present

## 2016-07-14 DIAGNOSIS — N184 Chronic kidney disease, stage 4 (severe): Secondary | ICD-10-CM | POA: Insufficient documentation

## 2016-07-14 DIAGNOSIS — Z885 Allergy status to narcotic agent status: Secondary | ICD-10-CM | POA: Insufficient documentation

## 2016-07-14 DIAGNOSIS — Z888 Allergy status to other drugs, medicaments and biological substances status: Secondary | ICD-10-CM | POA: Insufficient documentation

## 2016-07-14 DIAGNOSIS — Z992 Dependence on renal dialysis: Secondary | ICD-10-CM

## 2016-07-14 LAB — GLUCOSE, CAPILLARY
GLUCOSE-CAPILLARY: 101 mg/dL — AB (ref 65–99)
Glucose-Capillary: 97 mg/dL (ref 65–99)

## 2016-07-14 LAB — CBC
HCT: 32.1 % — ABNORMAL LOW (ref 36.0–46.0)
Hemoglobin: 10.6 g/dL — ABNORMAL LOW (ref 12.0–15.0)
MCH: 28 pg (ref 26.0–34.0)
MCHC: 33 g/dL (ref 30.0–36.0)
MCV: 84.9 fL (ref 78.0–100.0)
PLATELETS: 214 10*3/uL (ref 150–400)
RBC: 3.78 MIL/uL — AB (ref 3.87–5.11)
RDW: 15.5 % (ref 11.5–15.5)
WBC: 6.7 10*3/uL (ref 4.0–10.5)

## 2016-07-14 LAB — PROTIME-INR
INR: 0.95
PROTHROMBIN TIME: 12.6 s (ref 11.4–15.2)

## 2016-07-14 LAB — APTT: APTT: 30 s (ref 24–36)

## 2016-07-14 MED ORDER — FENTANYL CITRATE (PF) 100 MCG/2ML IJ SOLN
INTRAMUSCULAR | Status: AC
  Filled 2016-07-14: qty 2

## 2016-07-14 MED ORDER — HYDROCODONE-ACETAMINOPHEN 5-325 MG PO TABS
ORAL_TABLET | ORAL | Status: AC
  Administered 2016-07-14: 1 via ORAL
  Filled 2016-07-14: qty 1

## 2016-07-14 MED ORDER — SODIUM CHLORIDE 0.9 % IV SOLN
INTRAVENOUS | Status: AC | PRN
  Administered 2016-07-14: 10 mL/h via INTRAVENOUS

## 2016-07-14 MED ORDER — FENTANYL CITRATE (PF) 100 MCG/2ML IJ SOLN
INTRAMUSCULAR | Status: AC | PRN
  Administered 2016-07-14: 25 ug via INTRAVENOUS
  Administered 2016-07-14: 50 ug via INTRAVENOUS

## 2016-07-14 MED ORDER — SODIUM CHLORIDE 0.9 % IV SOLN: INTRAVENOUS | Status: DC

## 2016-07-14 MED ORDER — HYDROCODONE-ACETAMINOPHEN 5-325 MG PO TABS
1.0000 | ORAL_TABLET | Freq: Once | ORAL | Status: AC
  Administered 2016-07-14: 1 via ORAL

## 2016-07-14 MED ORDER — HYDRALAZINE HCL 20 MG/ML IJ SOLN
10.0000 mg | Freq: Once | INTRAMUSCULAR | Status: AC
  Administered 2016-07-14: 10 mg via INTRAVENOUS

## 2016-07-14 MED ORDER — HYDRALAZINE HCL 20 MG/ML IJ SOLN
INTRAMUSCULAR | Status: AC
  Filled 2016-07-14: qty 1

## 2016-07-14 MED ORDER — HYDRALAZINE HCL 20 MG/ML IJ SOLN
INTRAMUSCULAR | Status: AC
  Administered 2016-07-14: 10 mg via INTRAVENOUS
  Filled 2016-07-14: qty 1

## 2016-07-14 MED ORDER — LIDOCAINE HCL 1 % IJ SOLN
INTRAMUSCULAR | Status: AC
  Filled 2016-07-14: qty 20

## 2016-07-14 MED ORDER — MIDAZOLAM HCL 2 MG/2ML IJ SOLN
INTRAMUSCULAR | Status: AC | PRN
  Administered 2016-07-14: 1 mg via INTRAVENOUS
  Administered 2016-07-14: 0.5 mg via INTRAVENOUS

## 2016-07-14 MED ORDER — HYDRALAZINE HCL 20 MG/ML IJ SOLN
INTRAMUSCULAR | Status: AC | PRN
  Administered 2016-07-14: 5 mg via INTRAVENOUS

## 2016-07-14 MED ORDER — MIDAZOLAM HCL 2 MG/2ML IJ SOLN
INTRAMUSCULAR | Status: AC
  Filled 2016-07-14: qty 2

## 2016-07-14 NOTE — Sedation Documentation (Signed)
Patient denies pain and is resting comfortably.  

## 2016-07-14 NOTE — Discharge Instructions (Signed)
Percutaneous Kidney Biopsy, Care After °This sheet gives you information about how to care for yourself after your procedure. Your health care provider may also give you more specific instructions. If you have problems or questions, contact your health care provider. °What can I expect after the procedure? °After the procedure, it is common to have: °· Pain or soreness near the area where the needle went through your skin (biopsy site). °· Bright pink or cloudy urine for 24 hours after the procedure. °Follow these instructions at home: °Activity  °· Return to your normal activities as told by your health care provider. Ask your health care provider what activities are safe for you. °· Do not drive for 24 hours if you were given a medicine to help you relax (sedative). °· Do not lift anything that is heavier than 10 lb (4.5 kg) until your health care provider tells you that it is safe. °· Avoid activities that take a lot of effort (are strenuous) until your health care provider approves. Most people will have to wait 2 weeks before returning to activities such as exercise or sexual intercourse. °General instructions  °· Take over-the-counter and prescription medicines only as told by your health care provider. °· You may eat and drink after your procedure. Follow instructions from your health care provider about eating or drinking restrictions. °· Check your biopsy site every day for signs of infection. Check for: °¨ More redness, swelling, or pain. °¨ More fluid or blood. °¨ Warmth. °¨ Pus or a bad smell. °· Keep all follow-up visits as told by your health care provider. This is important. °Contact a health care provider if: °· You have more redness, swelling, or pain around your biopsy site. °· You have more fluid or blood coming from your biopsy site. °· Your biopsy site feels warm to the touch. °· You have pus or a bad smell coming from your biopsy site. °· You have blood in your urine more than 24 hours after  your procedure. °Get help right away if: °· You have dark red or brown urine. °· You have a fever. °· You are unable to urinate. °· You feel burning when you urinate. °· You feel faint. °· You have severe pain in your abdomen or side. °This information is not intended to replace advice given to you by your health care provider. Make sure you discuss any questions you have with your health care provider. °Document Released: 12/11/2012 Document Revised: 01/21/2016 Document Reviewed: 01/21/2016 °Elsevier Interactive Patient Education © 2017 Elsevier Inc. ° °

## 2016-07-14 NOTE — Sedation Documentation (Signed)
Patient is resting comfortably. 

## 2016-07-14 NOTE — Procedures (Signed)
Interventional Radiology Procedure Note  Procedure: US guided core biopsy of LEFT kidney  Complications: None  Estimated Blood Loss: None  Recommendations: - Bedrest x 4 hrs - Monitor for signs of bleeding - Path pending  Signed,  Criselda Peaches, MD

## 2016-07-14 NOTE — H&P (Signed)
Chief Complaint: Patient was seen in consultation today for random renal biopsy at the request of [place]  Referring Physician(s): Coladonato,Joseph  Supervising Physician: Jacqulynn Cadet  Patient Status: [facility] - Out-pt  History of Present Illness: [name] is a 71 y.o. female   Progressive chronic renal disease HTN; DM +smoker Scheduled for random renal bx per Dr Servando Salina  Past Medical History:  Diagnosis Date  . Anxiety   . Arthritis    lower back and knees   . Asthma    childhood  . Barrett's esophagus   . Depression   . Diabetes mellitus   . Dysphagia   . Eczema   . History of herpes zoster 02/2010   Recovered fully after period of acute herpetic neuralgia tx w/ gabapentin.   Marland Kitchen Hx MRSA infection 2009  . Hyperlipidemia   . Hypertension   . Neuromuscular disorder (HCC)    chronic pain  . Personal history of colonic polyps 10/17/2010   hyperplastic    Past Surgical History:  Procedure Laterality Date  . ABDOMINAL HYSTERECTOMY     unclear when  . CATARACT EXTRACTION Bilateral   . thigh surg     left side due to MRSA    Allergies: Lisinopril; Morphine and related; Pioglitazone; and Sulfonamide derivatives  Medications: Prior to Admission medications   Medication Sig Start Date End Date Taking? Authorizing Provider  ALPRAZolam Duanne Moron) 0.5 MG tablet take 1 tablet by mouth twice a day if needed for anxiety 06/27/16  Yes [name], NP  aspirin EC 81 MG tablet Take 1 tablet (81 mg total) by mouth daily. 03/15/12  Yes Hoyt Koch, MD  atenolol (TENORMIN) 50 MG tablet Take 1 tablet (50 mg total) by mouth daily. 06/01/16  Yes [name], NP  Cholecalciferol (VITAMIN D3) 5000 units TABS Take by mouth daily.   Yes Historical Provider, MD  Evolocumab (REPATHA) 140 MG/ML SOSY Inject 1 Dose into the skin every 14 (fourteen) days. 06/01/16  Yes [name], NP  latanoprost (XALATAN) 0.005 % ophthalmic solution Place 1 drop  into both eyes daily. Reported on 08/27/2015 04/23/12  Yes Historical Provider, MD  linagliptin (TRADJENTA) 5 MG TABS tablet Take 1 tablet (5 mg total) by mouth daily. 06/26/16  Yes [name], NP  loratadine (CLARITIN) 10 MG tablet Take 10 mg by mouth as needed. 06/11/12  Yes Rosalia Hammers, MD  omeprazole (PRILOSEC) 40 MG capsule take 1 capsule by mouth once daily 06/05/16  Yes [name], NP  PARoxetine (PAXIL) 40 MG tablet take 1 tablet by mouth once daily 03/27/16  Yes Gildardo Cranker, DO  rosuvastatin (CRESTOR) 10 MG tablet Take 1 tablet (10 mg total) by mouth daily. 06/01/16  Yes [name], NP  SUMAtriptan (IMITREX) 25 MG tablet 1 tablet daily as needed for headache, May repeat in 2 hours if headache persists or recurs. Max of 2 tablets in 24 hours 07/09/15  Yes Gildardo Cranker, DO  zolpidem (AMBIEN) 5 MG tablet Take 1 tablet (5 mg total) by mouth at bedtime as needed for sleep. 06/26/16  Yes [name], NP  AMBULATORY NON FORMULARY MEDICATION Trumetrix Test Strips Use to test blood sugar twice daily. Dx E11.49 01/26/15   [name], NP  amLODipine (NORVASC) 10 MG tablet Take 1 tablet (10 mg total) by mouth daily. Patient not taking: Reported on 07/10/2016 01/07/16   [name], NP  Blood Glucose Monitoring Suppl (TRUE METRIX AIR GLUCOSE METER)  w/Device KIT CHECK BLOOD SUGAR TWICE DAILY 03/03/16   [name], NP  clobetasol cream (TEMOVATE) 6.14 % Apply 1 application topically at bedtime.  09/03/13   Historical Provider, MD  diclofenac sodium (VOLTAREN) 1 % GEL Apply 4 g topically 4 (four) times daily as needed. Patient not taking: Reported on 07/10/2016 06/03/15   [name], NP  glucose blood (TRUE METRIX BLOOD GLUCOSE TEST) test strip CHECK BLOOD SUGAR TWICE DAILY 03/22/16   [name], NP  hydrOXYzine (ATARAX/VISTARIL) 10 MG tablet Take 10 mg by mouth every 6 (six) hours as needed for itching.    Historical Provider, MD  traMADol Veatrice Bourbon) 50 MG  tablet 1 by mouth every 6 hours as needed 02/19/15   Gildardo Cranker, DO  triamcinolone cream (KENALOG) 0.1 % Apply to affected area 2-3 times daily 04/14/15   Historical Provider, MD  TRUEPLUS LANCETS 33G MISC CHECK BLOOD SUGAR TWICE DAILY 03/22/16   [name], NP     Family History  Problem Relation Age of Onset  . Hypertension Father   . Kidney disease Father   . Diabetes Brother   . Colon cancer Neg Hx   . CAD Neg Hx     Social History   Social History  . Marital status: Divorced    Spouse name: N/A  . Number of children: 0  . Years of education: N/A   Occupational History  . Retired [employer]   Social History Main Topics  . Smoking status: Current Every Day Smoker    Packs/day: 0.25    Years: 40.00    Types: Cigarettes  . Smokeless tobacco: Never Used     Comment: smokes 1 pack of cigerettes a week- off and on  . Alcohol use 1.2 oz/week    2 Standard drinks or equivalent per week     Comment: wine and beer- occasionally   . Drug use: No  . Sexual activity: No   Other Topics Concern  . None   Social History Narrative   Financial assistance approved for 100% discount at [facility] and has [facility] card per [employer]   02/10/2010      3 caffeine drinks daily       Review of Systems: A 12 point ROS discussed and pertinent positives are indicated in the HPI above.  All other systems are negative.  Review of Systems  Constitutional: Negative for activity change, appetite change, fatigue and fever.  Respiratory: Negative for cough and shortness of breath.   Cardiovascular: Negative for chest pain.  Gastrointestinal: Negative for abdominal pain.  Neurological: Negative for weakness.  Psychiatric/Behavioral: Negative for behavioral problems and confusion.    Vital Signs: BP (!) 174/94   Pulse 63   Temp 98.1 F (36.7 C) (Oral)   Resp 18   Ht _0  (1.651 m)   Wt 182 lb (82.6 kg)   SpO2 99%   BMI 30.29 kg/m   Physical Exam  Constitutional: She is  oriented to person, place, and time. She appears well-nourished.  Cardiovascular: Normal rate, regular rhythm and normal heart sounds.   Pulmonary/Chest: Effort normal and breath sounds normal.  Abdominal: Soft. Bowel sounds are normal. There is no tenderness.  Musculoskeletal: Normal range of motion.  Neurological: She is alert and oriented to person, place, and time.  Skin: Skin is warm and dry.  Psychiatric: She has a normal mood and affect. Her behavior is normal. Judgment and thought content normal.  Nursing note and vitals reviewed.  Mallampati Score:  MD Evaluation Airway: WNL Heart: WNL Abdomen: WNL Chest/ Lungs: WNL ASA  Classification: 3 Mallampati/Airway Score: One  Imaging: No results found.  Labs:  CBC:  Recent Labs  08/27/15 0937 01/06/16 1207  WBC 7.7 8.3  HGB  --  10.0*  HCT 33.7* 30.5*  PLT 278 295    COAGS: No results for input(s): INR, APTT in the last 8760 hours.  BMP:  Recent Labs  09/08/15 0859 01/06/16 1207 01/13/16 1142 05/30/16 0910  NA 148* 138 141 141  K 4.0 4.3 4.8 5.2  CL 113* 107 111* 110  CO2 _0 GLUCOSE 69 41* 105* 131*  BUN 20 23 28* 33*  CALCIUM 8.6* 8.8 8.6 8.9  CREATININE 1.97* 2.73* 2.89* 3.51*  GFRNONAA 25* 17* 16* 13*  GFRAA 29* 20* 18* 14*    LIVER FUNCTION TESTS:  Recent Labs  08/27/15 0937 01/06/16 1207 01/13/16 1142 05/30/16 0910  BILITOT 0.3 0.5 0.3 0.5  AST _1 ALT _2 ALKPHOS 145* 130 122 103  PROT 6.1 6.1 5.9* 6.0*  ALBUMIN 3.5* 3.2* 3.4* 3.3*    TUMOR MARKERS: No results for input(s): AFPTM, CEA, CA199, CHROMGRNA in the last 8760 hours.  Assessment and Plan:  CKD- unexplained progression For random renal biopsy Risks and Benefits discussed with the patient including, but not limited to bleeding, infection, damage to adjacent structures or low yield requiring additional tests. All of the patient's questions were answered, patient is agreeable to  proceed. Consent signed and in chart.   Thank you for this interesting consult.  I greatly enjoyed meeting [employer] and look forward to participating in their care.  A copy of this report was sent to the requesting provider on this date.  Electronically Signed: Monia Sabal A 07/14/2016, 6:44 AM   I spent a total of  30 Minutes   in face to face in clinical consultation, greater than 50% of which was counseling/coordinating care for random renal bx

## 2016-07-25 ENCOUNTER — Encounter (HOSPITAL_COMMUNITY): Payer: Self-pay

## 2016-07-26 ENCOUNTER — Telehealth: Payer: Self-pay | Admitting: *Deleted

## 2016-07-26 NOTE — Telephone Encounter (Signed)
Patient called and stated that she has to get her medication Repatha through [facility]) and it was going to be $10 and that is what she paid the first time for her injection. Today they called her and stated that her copay to get the Repatha refilled was going to be $300.00. Patient stated that she WILL NOT pay this for a medication and needs it changed to something more affordable. Please Advise.

## 2016-07-26 NOTE — Telephone Encounter (Signed)
Patient called and wanted to know when her next dose of Repatha was. I asked her when her last injection was and she stated when she was in our office last. OV was 07/10/16. Instructed her the next dose was to be 07/24/16. She stated that she will take it today 07/26/16. I instructed her to write the dates she takes the medication on a calendar and I gave her the next 2 dates and told her that it would be every 14 days. 08/09/2016 and 5/2/208 and so forth. She stated that she will write it down and go by that.

## 2016-07-26 NOTE — Telephone Encounter (Signed)
Please call Merry [facility] (rep) and see if there is something can be done regarding cost

## 2016-07-27 NOTE — Telephone Encounter (Signed)
I called [place] to get an explanation of how co-pay jumped from 10 dollars to 300. I was told that patient has to meet deductible.

## 2016-07-27 NOTE — Telephone Encounter (Signed)
Left message on voicemail for patient to return call when available    Reason for call, I have a patient assistance form that we can complete if patient wishes. Patient will need to answer the questions to qualify.

## 2016-07-31 NOTE — Telephone Encounter (Signed)
Left message with female to have patient return call when available.

## 2016-07-31 NOTE — Telephone Encounter (Signed)
Patient returned call, patient would like to proceed with [employer] Form   Provider information complete, then to be mailed to patient. Patient to complete her part and mail back or drop off, formed then to be faxed to [phone]

## 2016-08-01 NOTE — Telephone Encounter (Signed)
[name] filled out and signed form. Copy made and mailed original to patient.

## 2016-08-02 ENCOUNTER — Other Ambulatory Visit: Payer: Self-pay | Admitting: Internal Medicine

## 2016-08-02 ENCOUNTER — Other Ambulatory Visit: Payer: Self-pay | Admitting: Nurse Practitioner

## 2016-08-02 DIAGNOSIS — F418 Other specified anxiety disorders: Secondary | ICD-10-CM

## 2016-08-02 DIAGNOSIS — G43009 Migraine without aura, not intractable, without status migrainosus: Secondary | ICD-10-CM

## 2016-08-02 DIAGNOSIS — I10 Essential (primary) hypertension: Secondary | ICD-10-CM

## 2016-08-07 ENCOUNTER — Ambulatory Visit: Payer: Self-pay | Admitting: *Deleted

## 2016-08-07 ENCOUNTER — Ambulatory Visit: Payer: Commercial Managed Care - HMO

## 2016-08-08 ENCOUNTER — Other Ambulatory Visit: Payer: Self-pay | Admitting: *Deleted

## 2016-08-08 NOTE — Patient Outreach (Signed)
[place] [facility]) Care Management  08/08/2016  [name] [birth year] [id number]  Follow up telephone call to patient; left message on voice requesting call back.  Plan: Will follow up   Sherrin Daisy, RN BSN [place] Management Coordinator [facility] Care Management  [phone]

## 2016-08-11 ENCOUNTER — Other Ambulatory Visit: Payer: Self-pay | Admitting: *Deleted

## 2016-08-11 NOTE — Patient Outreach (Signed)
[facility]) Care Management  08/11/2016  [name] 11/10/1945 [id number]  Telephone call x 2 for follow up assessment; left message on voice mail requesting call back.  Plan: Will follow up.  Sherrin Daisy, RN BSN [place] Management Coordinator [facility] Care Management  [phone]

## 2016-08-16 ENCOUNTER — Other Ambulatory Visit: Payer: Self-pay | Admitting: *Deleted

## 2016-08-16 NOTE — Patient Outreach (Signed)
[place] [facility]) Care Management  08/16/2016  [name] 03-02-1946 [id number]  Telephone call x 3 for follow up.  Follow up call to patient; person answered call & advised that patient was not there & that they were  unable to take message.   Plan: Follow up. Geophysicist/field seismologist.  Sherrin Daisy, RN BSN [place] Management Coordinator [facility] Care Management  [phone]

## 2016-08-23 ENCOUNTER — Other Ambulatory Visit: Payer: Self-pay | Admitting: Nurse Practitioner

## 2016-08-23 DIAGNOSIS — I10 Essential (primary) hypertension: Secondary | ICD-10-CM

## 2016-08-29 DIAGNOSIS — N2581 Secondary hyperparathyroidism of renal origin: Secondary | ICD-10-CM | POA: Diagnosis not present

## 2016-08-29 DIAGNOSIS — Z6832 Body mass index (BMI) 32.0-32.9, adult: Secondary | ICD-10-CM | POA: Diagnosis not present

## 2016-08-29 DIAGNOSIS — E1129 Type 2 diabetes mellitus with other diabetic kidney complication: Secondary | ICD-10-CM | POA: Diagnosis not present

## 2016-08-29 DIAGNOSIS — I129 Hypertensive chronic kidney disease with stage 1 through stage 4 chronic kidney disease, or unspecified chronic kidney disease: Secondary | ICD-10-CM | POA: Diagnosis not present

## 2016-08-29 DIAGNOSIS — D631 Anemia in chronic kidney disease: Secondary | ICD-10-CM | POA: Diagnosis not present

## 2016-08-29 DIAGNOSIS — N179 Acute kidney failure, unspecified: Secondary | ICD-10-CM | POA: Diagnosis not present

## 2016-08-29 DIAGNOSIS — Z720 Tobacco use: Secondary | ICD-10-CM | POA: Diagnosis not present

## 2016-08-29 DIAGNOSIS — N185 Chronic kidney disease, stage 5: Secondary | ICD-10-CM | POA: Diagnosis not present

## 2016-08-30 ENCOUNTER — Encounter: Payer: Self-pay | Admitting: *Deleted

## 2016-08-30 ENCOUNTER — Other Ambulatory Visit: Payer: Self-pay | Admitting: *Deleted

## 2016-08-30 NOTE — Patient Outreach (Signed)
[place] [facility]) Care Management  08/30/2016  [name] [birth year] [id number]  No response from patient after 3 call attempts & outreach letter.  Care plan goals not met.   Plan; Send MD closure letter. Send patient closure letter. Send to care management assistant to close out.  Sherrin Daisy, RN BSN [place] Management Coordinator [facility] Care Management  [phone]

## 2016-09-13 DIAGNOSIS — Z1231 Encounter for screening mammogram for malignant neoplasm of breast: Secondary | ICD-10-CM | POA: Diagnosis not present

## 2016-09-13 LAB — HM MAMMOGRAPHY

## 2016-09-14 ENCOUNTER — Telehealth: Payer: Self-pay

## 2016-09-14 NOTE — Telephone Encounter (Signed)
I called patient to remind her that she has a bone density test ordered.   There was no answer and I could not leave a message due to no voicemail.

## 2016-09-15 ENCOUNTER — Encounter: Payer: Self-pay | Admitting: Gastroenterology

## 2016-09-19 NOTE — Telephone Encounter (Signed)
Called patient. No answer and I could not leave a message due to voicemail being full.

## 2016-09-20 ENCOUNTER — Other Ambulatory Visit (HOSPITAL_COMMUNITY): Payer: Self-pay | Admitting: *Deleted

## 2016-09-21 ENCOUNTER — Ambulatory Visit (HOSPITAL_COMMUNITY): Admission: RE | Admit: 2016-09-21 | Payer: Commercial Managed Care - HMO | Source: Ambulatory Visit

## 2016-09-22 ENCOUNTER — Other Ambulatory Visit: Payer: Self-pay | Admitting: Nurse Practitioner

## 2016-09-22 DIAGNOSIS — I10 Essential (primary) hypertension: Secondary | ICD-10-CM

## 2016-09-26 ENCOUNTER — Other Ambulatory Visit: Payer: Medicare HMO

## 2016-09-27 ENCOUNTER — Other Ambulatory Visit: Payer: Medicare HMO

## 2016-09-28 ENCOUNTER — Ambulatory Visit (INDEPENDENT_AMBULATORY_CARE_PROVIDER_SITE_OTHER): Payer: Medicare HMO | Admitting: Nurse Practitioner

## 2016-09-28 ENCOUNTER — Encounter: Payer: Self-pay | Admitting: Nurse Practitioner

## 2016-09-28 DIAGNOSIS — E1149 Type 2 diabetes mellitus with other diabetic neurological complication: Secondary | ICD-10-CM | POA: Diagnosis not present

## 2016-09-28 DIAGNOSIS — F418 Other specified anxiety disorders: Secondary | ICD-10-CM

## 2016-09-28 DIAGNOSIS — I10 Essential (primary) hypertension: Secondary | ICD-10-CM

## 2016-09-28 DIAGNOSIS — Z794 Long term (current) use of insulin: Secondary | ICD-10-CM | POA: Diagnosis not present

## 2016-09-28 DIAGNOSIS — E782 Mixed hyperlipidemia: Secondary | ICD-10-CM

## 2016-09-28 DIAGNOSIS — G43009 Migraine without aura, not intractable, without status migrainosus: Secondary | ICD-10-CM

## 2016-09-28 LAB — COMPLETE METABOLIC PANEL WITH GFR
ALT: 18 U/L (ref 6–29)
AST: 18 U/L (ref 10–35)
Albumin: 3.1 g/dL — ABNORMAL LOW (ref 3.6–5.1)
Alkaline Phosphatase: 88 U/L (ref 33–130)
BILIRUBIN TOTAL: 0.3 mg/dL (ref 0.2–1.2)
BUN: 50 mg/dL — AB (ref 7–25)
CO2: 18 mmol/L — AB (ref 20–31)
CREATININE: 6.33 mg/dL — AB (ref 0.60–0.93)
Calcium: 7.9 mg/dL — ABNORMAL LOW (ref 8.6–10.4)
Chloride: 115 mmol/L — ABNORMAL HIGH (ref 98–110)
GFR, Est African American: 7 mL/min — ABNORMAL LOW (ref >=60)
GFR, Est Non African American: 6 mL/min — ABNORMAL LOW (ref >=60)
GLUCOSE: 91 mg/dL (ref 65–99)
Potassium: 4.6 mmol/L (ref 3.5–5.3)
SODIUM: 144 mmol/L (ref 135–146)
Total Protein: 5.8 g/dL — ABNORMAL LOW (ref 6.1–8.1)

## 2016-09-28 LAB — LIPID PANEL
Cholesterol: 161 mg/dL (ref <200)
HDL: 62 mg/dL (ref >50)
LDL CALC: 77 mg/dL (ref <100)
Total CHOL/HDL Ratio: 2.6 Ratio (ref <5.0)
Triglycerides: 110 mg/dL (ref <150)
VLDL: 22 mg/dL (ref <30)

## 2016-09-28 MED ORDER — ROSUVASTATIN CALCIUM 10 MG PO TABS: 10.0000 mg | ORAL_TABLET | Freq: Every day | ORAL | 1 refills | Status: DC

## 2016-09-28 MED ORDER — ZOLPIDEM TARTRATE 5 MG PO TABS: 5.0000 mg | ORAL_TABLET | Freq: Every evening | ORAL | 1 refills | Status: DC | PRN

## 2016-09-28 MED ORDER — ALPRAZOLAM 0.5 MG PO TABS: ORAL_TABLET | ORAL | 0 refills | Status: DC

## 2016-09-28 MED ORDER — AMLODIPINE BESYLATE 10 MG PO TABS: 10.0000 mg | ORAL_TABLET | Freq: Every day | ORAL | 1 refills | Status: DC

## 2016-09-28 MED ORDER — ATENOLOL 50 MG PO TABS: 50.0000 mg | ORAL_TABLET | Freq: Every day | ORAL | 1 refills | Status: DC

## 2016-09-28 NOTE — Progress Notes (Signed)
Careteam: Patient Care Team: [name], NP as PCP - General (Nurse Practitioner) Woodroe Mode (Inactive) as Diabetes Educator [name], MD as Consulting Physician (Ophthalmology)   Allergies  Allergen Reactions  . Lisinopril Other (See Comments)    Abnormal Kidney Function   . Morphine And Related     nauseated  . Pioglitazone     REACTION: Desquamation of skin of the palm  . Sulfonamide Derivatives     rash    Chief Complaint  Patient presents with  . Medical Management of Chronic Issues    3 month follow-up, fasting labs completed today, due for MALB, and + fall risk   . Medication Refill    Refill Amlodipine, Atenolol, Ambien,Crestor and Alprazolam (90 day supply for maintenance mediations)   . Health Maintenance    Discuss Hep C Screening Test      HPI: Patient is a 71 y.o. female seen in the office today for routine follow up. Pt with hx of CKD, hyperlipidemia, DM, constipation. Did not get blood work prior to visit today but came in early to have it.  Bone density was done in May of 2017- osteopenia.  Has follow up with nephrologist in AM due to CKD, reports they are giving her information about dialysis and pt states "I am not going to take".  Mood has been up and down. A few months ago she reports she wanted to hurt her brother but this has not been recently. Reports her depression is worse due to her mother being on hospice and she gets agitated really quick. Only her and her brother.  Also has a cousin and a friend for support if needed   Not taking repatha stated it was too high. She was not going to take this because it was going to be over 300$; patient never followed up on cost after paperwork for assistance filled out.   Not taking atenolol, Rx ran out.  Review of Systems:  Review of Systems  Constitutional: Negative for chills, fever and weight loss.  HENT: Negative for tinnitus.   Respiratory: Negative for cough, sputum production  and shortness of breath.   Cardiovascular: Negative for chest pain, palpitations and leg swelling.  Gastrointestinal: Negative for abdominal pain, constipation, diarrhea and heartburn.  Genitourinary: Negative for dysuria, frequency and urgency.  Musculoskeletal: Negative for back pain, falls, joint pain and myalgias.  Skin: Negative.   Neurological: Negative for dizziness and headaches.  Psychiatric/Behavioral: Positive for depression. Negative for memory loss and suicidal ideas. The patient is nervous/anxious. The patient does not have insomnia.     Past Medical History:  Diagnosis Date  . Anxiety   . Arthritis    lower back and knees   . Asthma    childhood  . Barrett's esophagus   . Depression   . Diabetes mellitus   . Dysphagia   . Eczema   . History of herpes zoster 02/2010   Recovered fully after period of acute herpetic neuralgia tx w/ gabapentin.   Marland Kitchen Hx MRSA infection 2009  . Hyperlipidemia   . Hypertension   . Neuromuscular disorder (HCC)    chronic pain  . Personal history of colonic polyps 10/17/2010   hyperplastic   Past Surgical History:  Procedure Laterality Date  . ABDOMINAL HYSTERECTOMY     unclear when  . CATARACT EXTRACTION Bilateral   . thigh surg     left side due to MRSA   Social History:   reports that  she has been smoking Cigarettes.  She has a 10.00 pack-year smoking history. She has never used smokeless tobacco. She reports that she drinks about 1.2 oz of alcohol per week . She reports that she does not use drugs.  Family History  Problem Relation Age of Onset  . Hypertension Father   . Kidney disease Father   . Diabetes Brother   . Colon cancer Neg Hx   . CAD Neg Hx     Medications: Patient's Medications  New Prescriptions   No medications on file  Previous Medications   AMBULATORY NON FORMULARY MEDICATION    [employer]um[employer] Test Strips Use to test blood sugar twice daily. Dx E11.49   AMLODIPINE (NORVASC) 10 MG TABLET    Take 1 tablet (10  mg total) by mouth daily.   ASPIRIN EC 81 MG TABLET    Take 1 tablet (81 mg total) by mouth daily.   ATENOLOL (TENORMIN) 50 MG TABLET    Take 1 tablet (50 mg total) by mouth daily.   BLOOD GLUCOSE MONITORING SUPPL (TRUE METRIX AIR GLUCOSE METER) W/DEVICE KIT    CHECK BLOOD SUGAR TWICE DAILY   CHOLECALCIFEROL (VITAMIN D3) 5000 UNITS TABS    Take by mouth daily.   CLOBETASOL CREAM (TEMOVATE) 0.05 %    Apply 1 application topically at bedtime.    DICLOFENAC SODIUM (VOLTAREN) 1 % GEL    Apply 4 g topically 4 (four) times daily as needed.   EVOLOCUMAB (REPATHA) 140 MG/ML SOSY    Inject 1 Dose into the skin every 14 (fourteen) days.   GLUCOSE BLOOD (TRUE METRIX BLOOD GLUCOSE TEST) TEST STRIP    CHECK BLOOD SUGAR TWICE DAILY   HYDROXYZINE (ATARAX/VISTARIL) 10 MG TABLET    Take 10 mg by mouth every 6 (six) hours as needed for itching.   LATANOPROST (XALATAN) 0.005 % OPHTHALMIC SOLUTION    Place 1 drop into both eyes daily. Reported on 08/27/2015   LINAGLIPTIN (TRADJENTA) 5 MG TABS TABLET    Take 1 tablet (5 mg total) by mouth daily.   LORATADINE (CLARITIN) 10 MG TABLET    Take 10 mg by mouth as needed.   OMEPRAZOLE (PRILOSEC) 40 MG CAPSULE    take 1 capsule by mouth once daily   PAROXETINE (PAXIL) 40 MG TABLET    take 1 tablet by mouth once daily   ROSUVASTATIN (CRESTOR) 10 MG TABLET    Take 1 tablet (10 mg total) by mouth daily.   SUMATRIPTAN (IMITREX) 25 MG TABLET    1 tablet daily as needed for headache, May repeat in 2 hours if headache persists or recurs. Max of 2 tablets in 24 hours   TRAMADOL (ULTRAM) 50 MG TABLET    1 by mouth every 6 hours as needed   TRIAMCINOLONE CREAM (KENALOG) 0.1 %    Apply to affected area 2-3 times daily   TRUEPLUS LANCETS 33G MISC    CHECK BLOOD SUGAR TWICE DAILY  Modified Medications   Modified Medication Previous Medication   ALPRAZOLAM (XANAX) 0.5 MG TABLET ALPRAZolam (XANAX) 0.5 MG tablet      take 1 tablet by mouth twice a day if needed for anxiety    take 1 tablet  by mouth twice a day if needed for anxiety   ZOLPIDEM (AMBIEN) 5 MG TABLET zolpidem (AMBIEN) 5 MG tablet      Take 1 tablet (5 mg total) by mouth at bedtime as needed for sleep.    Take 1 tablet (5 mg total) by  mouth at bedtime as needed for sleep.  Discontinued Medications   ATENOLOL (TENORMIN) 25 MG TABLET    take 1 tablet by mouth once daily for migraines     Physical Exam:  Vitals:   09/28/16 0951  BP: (!) 158/90  Pulse: 60  Temp: 97.8 F (36.6 C)  TempSrc: Oral  SpO2: 97%  Weight: 177 lb (80.3 kg)  Height: 5' 5"  (1.651 m)   Body mass index is 29.45 kg/m.  Physical Exam  Constitutional: She is oriented to person, place, and time. She appears well-developed and well-nourished. No distress.  HENT:  Head: Normocephalic and atraumatic.  Eyes: Conjunctivae and EOM are normal. Pupils are equal, round, and reactive to light.  Cardiovascular: Normal rate, regular rhythm and normal heart sounds.   No murmur heard. Pulmonary/Chest: Effort normal and breath sounds normal. No respiratory distress. She has no wheezes. She exhibits no tenderness.  Abdominal: Soft. Bowel sounds are normal.  Neurological: She is alert and oriented to person, place, and time.  Skin: Skin is warm and dry.  Psychiatric: She has a normal mood and affect.    Labs reviewed: Basic Metabolic Panel:  Recent Labs  01/06/16 1207 01/13/16 1142 05/30/16 0910  NA 138 141 141  K 4.3 4.8 5.2  CL 107 111* 110  CO2 22 21 23   GLUCOSE 41* 105* 131*  BUN 23 28* 33*  CREATININE 2.73* 2.89* 3.51*  CALCIUM 8.8 8.6 8.9  TSH 1.41  --   --    Liver Function Tests:  Recent Labs  01/06/16 1207 01/13/16 1142 05/30/16 0910  AST 15 13 15   ALT 17 14 16   ALKPHOS 130 122 103  BILITOT 0.5 0.3 0.5  PROT 6.1 5.9* 6.0*  ALBUMIN 3.2* 3.4* 3.3*   No results for input(s): LIPASE, AMYLASE in the last 8760 hours. No results for input(s): AMMONIA in the last 8760 hours. CBC:  Recent Labs  01/06/16 1207  07/14/16 0559  WBC 8.3 6.7  NEUTROABS 3,818  --   HGB 10.0* 10.6*  HCT 30.5* 32.1*  MCV 87.4 84.9  PLT 295 214   Lipid Panel:  Recent Labs  01/06/16 1207 05/30/16 0910  CHOL 239* 218*  HDL 71 55  LDLCALC 137* 130*  TRIG 155* 163*  CHOLHDL 3.4 4.0   TSH:  Recent Labs  01/06/16 1207  TSH 1.41   A1C: Lab Results  Component Value Date   HGBA1C 5.5 05/30/2016     Assessment/Plan 1. 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 Mixed hyperlipidemia -rapatha too expensive. Will DC and to cont crestor. encouaraged lifestyle modifications.  - CMP with eGFR - Lipid Panel - rosuvastatin (CRESTOR) 10 MG tablet; Take 1 tablet (10 mg total) by mouth daily.  Dispense: 90 tablet; Refill: 1  3. Depression with anxiety Worse now that her mother is under hospice care, encouraged her to seek counseling through hospice services. conts on paxil  - ALPRAZolam (XANAX) 0.5 MG tablet; take 1 tablet by mouth twice a day if needed for anxiety  Dispense: 60 tablet; Refill: 0  4. Nonintractable migraine, unspecified migraine type Stable,  - atenolol (TENORMIN) 50 MG tablet; Take 1 tablet (50 mg total) by mouth daily.  Dispense: 90 tablet; Refill: 1  5. Essential hypertension -not controlled, pt not taking blood pressure medication as prescribed. Education on need for compliance.  Medication not adjusted since she has not been taking what is currently prescribed.  - atenolol (TENORMIN) 50 MG tablet; Take 1 tablet (50 mg total) by mouth daily.  Dispense: 90 tablet; Refill: 1  Follow up in 6 weeks for blood pressure [facility] Mell K. Harle Battiest  [facility] [phone] 8 am - 5 pm) [phone] (after hours)

## 2016-09-28 NOTE — Patient Instructions (Signed)
Please talk to hospice about grief counseling.  Refill provided for atenolol; make sure you take your medication for blood pressure   DASH Eating Plan DASH stands for "Dietary Approaches to Stop Hypertension." The DASH eating plan is a healthy eating plan that has been shown to reduce high blood pressure (hypertension). It may also reduce your risk for type 2 diabetes, heart disease, and stroke. The DASH eating plan may also help with weight loss. What are tips for following this plan? General guidelines  Avoid eating more than 2,300 mg (milligrams) of salt (sodium) a day. If you have hypertension, you may need to reduce your sodium intake to 1,500 mg a day.  Limit alcohol intake to no more than 1 drink a day for nonpregnant women and 2 drinks a day for men. One drink equals 12 oz of beer, 5 oz of wine, or 1 oz of hard liquor.  Work with your health care provider to maintain a healthy body weight or to lose weight. Ask what an ideal weight is for you.  Get at least 30 minutes of exercise that causes your heart to beat faster (aerobic exercise) most days of the week. Activities may include walking, swimming, or biking.  Work with your health care provider or diet and nutrition specialist (dietitian) to adjust your eating plan to your individual calorie needs. Reading food labels  Check food labels for the amount of sodium per serving. Choose foods with less than 5 percent of the Daily Value of sodium. Generally, foods with less than 300 mg of sodium per serving fit into this eating plan.  To find whole grains, look for the word "whole" as the first word in the ingredient list. Shopping  Buy products labeled as "low-sodium" or "no salt added."  Buy fresh foods. Avoid canned foods and premade or frozen meals. Cooking  Avoid adding salt when cooking. Use salt-free seasonings or herbs instead of table salt or sea salt. Check with your health care provider or pharmacist before using salt  substitutes.  Do not fry foods. Cook foods using healthy methods such as baking, boiling, grilling, and broiling instead.  Cook with heart-healthy oils, such as olive, canola, soybean, or sunflower oil. Meal planning   Eat a balanced diet that includes: ? 5 or more servings of fruits and vegetables each day. At each meal, try to fill half of your plate with fruits and vegetables. ? Up to 6-8 servings of whole grains each day. ? Less than 6 oz of lean meat, poultry, or fish each day. A 3-oz serving of meat is about the same size as a deck of cards. One egg equals 1 oz. ? 2 servings of low-fat dairy each day. ? A serving of nuts, seeds, or beans 5 times each week. ? Heart-healthy fats. Healthy fats called Omega-3 fatty acids are found in foods such as flaxseeds and coldwater fish, like sardines, salmon, and mackerel.  Limit how much you eat of the following: ? Canned or prepackaged foods. ? Food that is high in trans fat, such as fried foods. ? Food that is high in saturated fat, such as fatty meat. ? Sweets, desserts, sugary drinks, and other foods with added sugar. ? Full-fat dairy products.  Do not salt foods before eating.  Try to eat at least 2 vegetarian meals each week.  Eat more home-cooked food and less restaurant, buffet, and fast food.  When eating at a restaurant, ask that your food be prepared with less salt or  no salt, if possible. °What foods are recommended? °The items listed may not be a complete list. Talk with your dietitian about what dietary choices are best for you. °Grains °Whole-grain or whole-wheat bread. Whole-grain or whole-wheat pasta. Brown rice. Oatmeal. Quinoa. Bulgur. Whole-grain and low-sodium cereals. Pita bread. Low-fat, low-sodium crackers. Whole-wheat flour tortillas. °Vegetables °Fresh or frozen vegetables (raw, steamed, roasted, or grilled). Low-sodium or reduced-sodium tomato and vegetable juice. Low-sodium or reduced-sodium tomato sauce and tomato  paste. Low-sodium or reduced-sodium canned vegetables. °Fruits °All fresh, dried, or frozen fruit. Canned fruit in natural juice (without added sugar). °Meat and other protein foods °Skinless chicken or turkey. Ground chicken or turkey. Pork with fat trimmed off. Fish and seafood. Egg whites. Dried beans, peas, or lentils. Unsalted nuts, nut butters, and seeds. Unsalted canned beans. Lean cuts of beef with fat trimmed off. Low-sodium, lean deli meat. °Dairy °Low-fat (1%) or fat-free (skim) milk. Fat-free, low-fat, or reduced-fat cheeses. Nonfat, low-sodium ricotta or cottage cheese. Low-fat or nonfat yogurt. Low-fat, low-sodium cheese. °Fats and oils °Soft margarine without trans fats. Vegetable oil. Low-fat, reduced-fat, or light mayonnaise and salad dressings (reduced-sodium). Canola, safflower, olive, soybean, and sunflower oils. Avocado. °Seasoning and other foods °Herbs. Spices. Seasoning mixes without salt. Unsalted popcorn and pretzels. Fat-free sweets. °What foods are not recommended? °The items listed may not be a complete list. Talk with your dietitian about what dietary choices are best for you. °Grains °Baked goods made with fat, such as croissants, muffins, or some breads. Dry pasta or rice meal packs. °Vegetables °Creamed or fried vegetables. Vegetables in a cheese sauce. Regular canned vegetables (not low-sodium or reduced-sodium). Regular canned tomato sauce and paste (not low-sodium or reduced-sodium). Regular tomato and vegetable juice (not low-sodium or reduced-sodium). Pickles. Olives. °Fruits °Canned fruit in a light or heavy syrup. Fried fruit. Fruit in cream or butter sauce. °Meat and other protein foods °Fatty cuts of meat. Ribs. Fried meat. Bacon. Sausage. Bologna and other processed lunch meats. Salami. Fatback. Hotdogs. Bratwurst. Salted nuts and seeds. Canned beans with added salt. Canned or smoked fish. Whole eggs or egg yolks. Chicken or turkey with skin. °Dairy °Whole or 2% milk,  cream, and half-and-half. Whole or full-fat cream cheese. Whole-fat or sweetened yogurt. Full-fat cheese. Nondairy creamers. Whipped toppings. Processed cheese and cheese spreads. °Fats and oils °Butter. Stick margarine. Lard. Shortening. Ghee. Bacon fat. Tropical oils, such as coconut, palm kernel, or palm oil. °Seasoning and other foods °Salted popcorn and pretzels. Onion salt, garlic salt, seasoned salt, table salt, and sea salt. Worcestershire sauce. Tartar sauce. Barbecue sauce. Teriyaki sauce. Soy sauce, including reduced-sodium. Steak sauce. Canned and packaged gravies. Fish sauce. Oyster sauce. Cocktail sauce. Horseradish that you find on the shelf. Ketchup. Mustard. Meat flavorings and tenderizers. Bouillon cubes. Hot sauce and Tabasco sauce. Premade or packaged marinades. Premade or packaged taco seasonings. Relishes. Regular salad dressings. °Where to find more information: °· National Heart, Lung, and Blood Institute: www.nhlbi.nih.gov °· American Heart Association: www.heart.org °Summary °· The DASH eating plan is a healthy eating plan that has been shown to reduce high blood pressure (hypertension). It may also reduce your risk for type 2 diabetes, heart disease, and stroke. °· With the DASH eating plan, you should limit salt (sodium) intake to 2,300 mg a day. If you have hypertension, you may need to reduce your sodium intake to 1,500 mg a day. °· When on the DASH eating plan, aim to eat more fresh fruits and vegetables, whole grains, lean proteins, low-fat dairy, and   heart-healthy fats.  Work with your health care provider or diet and nutrition specialist (dietitian) to adjust your eating plan to your individual calorie needs. This information is not intended to replace advice given to you by your health care provider. Make sure you discuss any questions you have with your health care provider. Document Released: 03/30/2011 Document Revised: 04/03/2016 Document Reviewed: 04/03/2016 Elsevier  Interactive Patient Education  2017 [employer].

## 2016-09-29 ENCOUNTER — Other Ambulatory Visit: Payer: Self-pay | Admitting: Nurse Practitioner

## 2016-09-29 DIAGNOSIS — D631 Anemia in chronic kidney disease: Secondary | ICD-10-CM | POA: Diagnosis not present

## 2016-09-29 DIAGNOSIS — I120 Hypertensive chronic kidney disease with stage 5 chronic kidney disease or end stage renal disease: Secondary | ICD-10-CM | POA: Diagnosis not present

## 2016-09-29 DIAGNOSIS — N179 Acute kidney failure, unspecified: Secondary | ICD-10-CM | POA: Diagnosis not present

## 2016-09-29 DIAGNOSIS — N2581 Secondary hyperparathyroidism of renal origin: Secondary | ICD-10-CM | POA: Diagnosis not present

## 2016-09-29 DIAGNOSIS — N185 Chronic kidney disease, stage 5: Secondary | ICD-10-CM | POA: Diagnosis not present

## 2016-09-29 DIAGNOSIS — Z720 Tobacco use: Secondary | ICD-10-CM | POA: Diagnosis not present

## 2016-09-29 DIAGNOSIS — Z6832 Body mass index (BMI) 32.0-32.9, adult: Secondary | ICD-10-CM | POA: Diagnosis not present

## 2016-09-29 DIAGNOSIS — E1129 Type 2 diabetes mellitus with other diabetic kidney complication: Secondary | ICD-10-CM | POA: Diagnosis not present

## 2016-09-29 LAB — HEMOGLOBIN A1C
HEMOGLOBIN A1C: 5.6 % (ref <5.7)
Mean Plasma Glucose: 114 mg/dL

## 2016-09-29 MED ORDER — AMLODIPINE BESYLATE 10 MG PO TABS: 10.0000 mg | ORAL_TABLET | Freq: Every day | ORAL | 0 refills | Status: DC

## 2016-09-29 MED ORDER — ATENOLOL 25 MG PO TABS: 25.0000 mg | ORAL_TABLET | Freq: Every day | ORAL | 0 refills | Status: DC

## 2016-10-05 ENCOUNTER — Ambulatory Visit (HOSPITAL_COMMUNITY)
Admission: RE | Admit: 2016-10-05 | Discharge: 2016-10-05 | Disposition: A | Discharge status: Home / Self Care | Payer: Medicare HMO | Source: Ambulatory Visit | Attending: Nephrology | Admitting: Nephrology

## 2016-10-05 DIAGNOSIS — D631 Anemia in chronic kidney disease: Secondary | ICD-10-CM | POA: Diagnosis not present

## 2016-10-05 DIAGNOSIS — N184 Chronic kidney disease, stage 4 (severe): Secondary | ICD-10-CM | POA: Diagnosis not present

## 2016-10-05 MED ORDER — SODIUM CHLORIDE 0.9 % IV SOLN
510.0000 mg | INTRAVENOUS | Status: DC
  Administered 2016-10-05: 510 mg via INTRAVENOUS
  Filled 2016-10-05: qty 17

## 2016-10-05 NOTE — Discharge Instructions (Signed)

## 2016-10-11 ENCOUNTER — Ambulatory Visit (HOSPITAL_COMMUNITY)
Admission: RE | Admit: 2016-10-11 | Discharge: 2016-10-11 | Disposition: A | Discharge status: Home / Self Care | Payer: Medicare HMO | Source: Ambulatory Visit | Attending: Nephrology | Admitting: Nephrology

## 2016-10-11 DIAGNOSIS — D631 Anemia in chronic kidney disease: Secondary | ICD-10-CM | POA: Diagnosis not present

## 2016-10-11 DIAGNOSIS — N184 Chronic kidney disease, stage 4 (severe): Secondary | ICD-10-CM | POA: Diagnosis not present

## 2016-10-11 MED ORDER — SODIUM CHLORIDE 0.9 % IV SOLN
510.0000 mg | INTRAVENOUS | Status: AC
  Administered 2016-10-11: 10:00:00 510 mg via INTRAVENOUS
  Filled 2016-10-11: qty 17

## 2016-10-12 ENCOUNTER — Encounter (HOSPITAL_COMMUNITY): Payer: Commercial Managed Care - HMO

## 2016-10-22 ENCOUNTER — Other Ambulatory Visit: Payer: Self-pay | Admitting: Nurse Practitioner

## 2016-10-22 DIAGNOSIS — I10 Essential (primary) hypertension: Secondary | ICD-10-CM

## 2016-10-28 ENCOUNTER — Other Ambulatory Visit: Payer: Self-pay | Admitting: Internal Medicine

## 2016-10-28 ENCOUNTER — Other Ambulatory Visit: Payer: Self-pay | Admitting: Nurse Practitioner

## 2016-10-28 DIAGNOSIS — E785 Hyperlipidemia, unspecified: Secondary | ICD-10-CM

## 2016-10-28 DIAGNOSIS — F418 Other specified anxiety disorders: Secondary | ICD-10-CM

## 2016-11-09 ENCOUNTER — Ambulatory Visit: Payer: Medicare HMO | Admitting: Nurse Practitioner

## 2016-11-21 ENCOUNTER — Other Ambulatory Visit: Payer: Self-pay | Admitting: Nurse Practitioner

## 2016-11-21 DIAGNOSIS — E785 Hyperlipidemia, unspecified: Secondary | ICD-10-CM

## 2016-11-29 DIAGNOSIS — I120 Hypertensive chronic kidney disease with stage 5 chronic kidney disease or end stage renal disease: Secondary | ICD-10-CM | POA: Diagnosis not present

## 2016-11-29 DIAGNOSIS — N179 Acute kidney failure, unspecified: Secondary | ICD-10-CM | POA: Diagnosis not present

## 2016-11-29 DIAGNOSIS — N2581 Secondary hyperparathyroidism of renal origin: Secondary | ICD-10-CM | POA: Diagnosis not present

## 2016-11-29 DIAGNOSIS — Z6832 Body mass index (BMI) 32.0-32.9, adult: Secondary | ICD-10-CM | POA: Diagnosis not present

## 2016-11-29 DIAGNOSIS — Z720 Tobacco use: Secondary | ICD-10-CM | POA: Diagnosis not present

## 2016-11-29 DIAGNOSIS — N185 Chronic kidney disease, stage 5: Secondary | ICD-10-CM | POA: Diagnosis not present

## 2016-11-29 DIAGNOSIS — D631 Anemia in chronic kidney disease: Secondary | ICD-10-CM | POA: Diagnosis not present

## 2016-11-29 DIAGNOSIS — E1129 Type 2 diabetes mellitus with other diabetic kidney complication: Secondary | ICD-10-CM | POA: Diagnosis not present

## 2016-12-02 ENCOUNTER — Emergency Department (HOSPITAL_COMMUNITY): Payer: Medicare Other

## 2016-12-02 ENCOUNTER — Inpatient Hospital Stay (HOSPITAL_COMMUNITY): Payer: Medicare Other

## 2016-12-02 ENCOUNTER — Inpatient Hospital Stay (HOSPITAL_COMMUNITY)
Admission: EM | Admit: 2016-12-02 | Discharge: 2016-12-03 | DRG: 065 | Disposition: A | Discharge status: Home Health | Payer: Medicare Other | Attending: Internal Medicine | Admitting: Internal Medicine

## 2016-12-02 ENCOUNTER — Encounter (HOSPITAL_COMMUNITY): Payer: Self-pay | Admitting: Emergency Medicine

## 2016-12-02 DIAGNOSIS — R41841 Cognitive communication deficit: Secondary | ICD-10-CM | POA: Diagnosis not present

## 2016-12-02 DIAGNOSIS — G4700 Insomnia, unspecified: Secondary | ICD-10-CM | POA: Diagnosis present

## 2016-12-02 DIAGNOSIS — F419 Anxiety disorder, unspecified: Secondary | ICD-10-CM | POA: Diagnosis not present

## 2016-12-02 DIAGNOSIS — Z794 Long term (current) use of insulin: Secondary | ICD-10-CM

## 2016-12-02 DIAGNOSIS — I672 Cerebral atherosclerosis: Secondary | ICD-10-CM | POA: Diagnosis not present

## 2016-12-02 DIAGNOSIS — R260 Ataxic gait: Secondary | ICD-10-CM | POA: Diagnosis present

## 2016-12-02 DIAGNOSIS — E1122 Type 2 diabetes mellitus with diabetic chronic kidney disease: Secondary | ICD-10-CM | POA: Diagnosis present

## 2016-12-02 DIAGNOSIS — Z882 Allergy status to sulfonamides status: Secondary | ICD-10-CM

## 2016-12-02 DIAGNOSIS — I120 Hypertensive chronic kidney disease with stage 5 chronic kidney disease or end stage renal disease: Secondary | ICD-10-CM | POA: Diagnosis not present

## 2016-12-02 DIAGNOSIS — Z8673 Personal history of transient ischemic attack (TIA), and cerebral infarction without residual deficits: Secondary | ICD-10-CM

## 2016-12-02 DIAGNOSIS — Z7682 Awaiting organ transplant status: Secondary | ICD-10-CM

## 2016-12-02 DIAGNOSIS — Z9841 Cataract extraction status, right eye: Secondary | ICD-10-CM

## 2016-12-02 DIAGNOSIS — Z9842 Cataract extraction status, left eye: Secondary | ICD-10-CM

## 2016-12-02 DIAGNOSIS — Z86010 Personal history of colonic polyps: Secondary | ICD-10-CM | POA: Diagnosis not present

## 2016-12-02 DIAGNOSIS — N185 Chronic kidney disease, stage 5: Secondary | ICD-10-CM

## 2016-12-02 DIAGNOSIS — Z7982 Long term (current) use of aspirin: Secondary | ICD-10-CM

## 2016-12-02 DIAGNOSIS — I340 Nonrheumatic mitral (valve) insufficiency: Secondary | ICD-10-CM | POA: Diagnosis not present

## 2016-12-02 DIAGNOSIS — R9082 White matter disease, unspecified: Secondary | ICD-10-CM | POA: Diagnosis present

## 2016-12-02 DIAGNOSIS — E785 Hyperlipidemia, unspecified: Secondary | ICD-10-CM | POA: Diagnosis present

## 2016-12-02 DIAGNOSIS — G8929 Other chronic pain: Secondary | ICD-10-CM | POA: Diagnosis not present

## 2016-12-02 DIAGNOSIS — K449 Diaphragmatic hernia without obstruction or gangrene: Secondary | ICD-10-CM | POA: Diagnosis not present

## 2016-12-02 DIAGNOSIS — Z885 Allergy status to narcotic agent status: Secondary | ICD-10-CM

## 2016-12-02 DIAGNOSIS — G709 Myoneural disorder, unspecified: Secondary | ICD-10-CM | POA: Diagnosis present

## 2016-12-02 DIAGNOSIS — I639 Cerebral infarction, unspecified: Secondary | ICD-10-CM | POA: Diagnosis present

## 2016-12-02 DIAGNOSIS — M47816 Spondylosis without myelopathy or radiculopathy, lumbar region: Secondary | ICD-10-CM | POA: Diagnosis not present

## 2016-12-02 DIAGNOSIS — N189 Chronic kidney disease, unspecified: Secondary | ICD-10-CM | POA: Diagnosis present

## 2016-12-02 DIAGNOSIS — I10 Essential (primary) hypertension: Secondary | ICD-10-CM | POA: Diagnosis present

## 2016-12-02 DIAGNOSIS — Z841 Family history of disorders of kidney and ureter: Secondary | ICD-10-CM

## 2016-12-02 DIAGNOSIS — K219 Gastro-esophageal reflux disease without esophagitis: Secondary | ICD-10-CM | POA: Diagnosis not present

## 2016-12-02 DIAGNOSIS — I6389 Other cerebral infarction: Secondary | ICD-10-CM

## 2016-12-02 DIAGNOSIS — F329 Major depressive disorder, single episode, unspecified: Secondary | ICD-10-CM | POA: Diagnosis present

## 2016-12-02 DIAGNOSIS — F17210 Nicotine dependence, cigarettes, uncomplicated: Secondary | ICD-10-CM | POA: Diagnosis present

## 2016-12-02 DIAGNOSIS — Z8614 Personal history of Methicillin resistant Staphylococcus aureus infection: Secondary | ICD-10-CM | POA: Diagnosis not present

## 2016-12-02 DIAGNOSIS — M170 Bilateral primary osteoarthritis of knee: Secondary | ICD-10-CM | POA: Diagnosis not present

## 2016-12-02 DIAGNOSIS — Z7984 Long term (current) use of oral hypoglycemic drugs: Secondary | ICD-10-CM

## 2016-12-02 DIAGNOSIS — Z79899 Other long term (current) drug therapy: Secondary | ICD-10-CM

## 2016-12-02 DIAGNOSIS — G3189 Other specified degenerative diseases of nervous system: Secondary | ICD-10-CM | POA: Diagnosis present

## 2016-12-02 DIAGNOSIS — Z90710 Acquired absence of both cervix and uterus: Secondary | ICD-10-CM

## 2016-12-02 DIAGNOSIS — Z833 Family history of diabetes mellitus: Secondary | ICD-10-CM

## 2016-12-02 DIAGNOSIS — I63512 Cerebral infarction due to unspecified occlusion or stenosis of left middle cerebral artery: Principal | ICD-10-CM | POA: Diagnosis present

## 2016-12-02 DIAGNOSIS — Z8249 Family history of ischemic heart disease and other diseases of the circulatory system: Secondary | ICD-10-CM

## 2016-12-02 DIAGNOSIS — Z888 Allergy status to other drugs, medicaments and biological substances status: Secondary | ICD-10-CM

## 2016-12-02 DIAGNOSIS — L309 Dermatitis, unspecified: Secondary | ICD-10-CM | POA: Diagnosis present

## 2016-12-02 DIAGNOSIS — R4182 Altered mental status, unspecified: Secondary | ICD-10-CM | POA: Diagnosis present

## 2016-12-02 DIAGNOSIS — E1149 Type 2 diabetes mellitus with other diabetic neurological complication: Secondary | ICD-10-CM

## 2016-12-02 LAB — DIFFERENTIAL
Basophils Absolute: 0 10*3/uL (ref 0.0–0.1)
Basophils Relative: 0 %
Eosinophils Absolute: 0.6 10*3/uL (ref 0.0–0.7)
Eosinophils Relative: 7 %
LYMPHS PCT: 30 %
Lymphs Abs: 2.3 10*3/uL (ref 0.7–4.0)
MONO ABS: 0.4 10*3/uL (ref 0.1–1.0)
MONOS PCT: 5 %
NEUTROS ABS: 4.4 10*3/uL (ref 1.7–7.7)
Neutrophils Relative %: 58 %

## 2016-12-02 LAB — CBC
HCT: 27.9 % — ABNORMAL LOW (ref 36.0–46.0)
Hemoglobin: 9.3 g/dL — ABNORMAL LOW (ref 12.0–15.0)
MCH: 28.9 pg (ref 26.0–34.0)
MCHC: 33.3 g/dL (ref 30.0–36.0)
MCV: 86.6 fL (ref 78.0–100.0)
PLATELETS: 237 10*3/uL (ref 150–400)
RBC: 3.22 MIL/uL — ABNORMAL LOW (ref 3.87–5.11)
RDW: 15.4 % (ref 11.5–15.5)
WBC: 7.7 10*3/uL (ref 4.0–10.5)

## 2016-12-02 LAB — I-STAT CHEM 8, ED
BUN: 62 mg/dL — AB (ref 6–20)
CREATININE: 8.8 mg/dL — AB (ref 0.44–1.00)
Calcium, Ion: 1.24 mmol/L (ref 1.15–1.40)
Chloride: 113 mmol/L — ABNORMAL HIGH (ref 101–111)
Glucose, Bld: 89 mg/dL (ref 65–99)
HEMATOCRIT: 26 % — AB (ref 36.0–46.0)
Hemoglobin: 8.8 g/dL — ABNORMAL LOW (ref 12.0–15.0)
POTASSIUM: 4.6 mmol/L (ref 3.5–5.1)
Sodium: 143 mmol/L (ref 135–145)
TCO2: 20 mmol/L (ref 0–100)

## 2016-12-02 LAB — GLUCOSE, CAPILLARY
GLUCOSE-CAPILLARY: 113 mg/dL — AB (ref 65–99)
Glucose-Capillary: 72 mg/dL (ref 65–99)

## 2016-12-02 LAB — I-STAT TROPONIN, ED: Troponin i, poc: 0 ng/mL (ref 0.00–0.08)

## 2016-12-02 LAB — COMPREHENSIVE METABOLIC PANEL
ALK PHOS: 104 U/L (ref 38–126)
ALT: 14 U/L (ref 14–54)
AST: 15 U/L (ref 15–41)
Albumin: 3.1 g/dL — ABNORMAL LOW (ref 3.5–5.0)
Anion gap: 9 (ref 5–15)
BILIRUBIN TOTAL: 0.5 mg/dL (ref 0.3–1.2)
BUN: 58 mg/dL — AB (ref 6–20)
CHLORIDE: 113 mmol/L — AB (ref 101–111)
CO2: 18 mmol/L — ABNORMAL LOW (ref 22–32)
CREATININE: 8.42 mg/dL — AB (ref 0.44–1.00)
Calcium: 8.8 mg/dL — ABNORMAL LOW (ref 8.9–10.3)
GFR calc Af Amer: 5 mL/min — ABNORMAL LOW (ref >60)
GFR, EST NON AFRICAN AMERICAN: 4 mL/min — AB (ref >60)
Glucose, Bld: 100 mg/dL — ABNORMAL HIGH (ref 65–99)
Potassium: 4.6 mmol/L (ref 3.5–5.1)
Sodium: 140 mmol/L (ref 135–145)
Total Protein: 6.2 g/dL — ABNORMAL LOW (ref 6.5–8.1)

## 2016-12-02 LAB — PROTIME-INR
INR: 0.98
Prothrombin Time: 13 seconds (ref 11.4–15.2)

## 2016-12-02 LAB — PHOSPHORUS: Phosphorus: 6.8 mg/dL — ABNORMAL HIGH (ref 2.5–4.6)

## 2016-12-02 LAB — CBG MONITORING, ED: Glucose-Capillary: 88 mg/dL (ref 65–99)

## 2016-12-02 LAB — APTT: aPTT: 33 seconds (ref 24–36)

## 2016-12-02 MED ORDER — INSULIN ASPART 100 UNIT/ML ~~LOC~~ SOLN: 0.0000 [IU] | Freq: Three times a day (TID) | SUBCUTANEOUS | Status: DC

## 2016-12-02 MED ORDER — DICLOFENAC SODIUM 1 % TD GEL
4.0000 g | Freq: Four times a day (QID) | TRANSDERMAL | Status: DC | PRN
  Filled 2016-12-02: qty 100

## 2016-12-02 MED ORDER — ACETAMINOPHEN 650 MG RE SUPP: 650.0000 mg | RECTAL | Status: DC | PRN

## 2016-12-02 MED ORDER — ACETAMINOPHEN 325 MG PO TABS: 650.0000 mg | ORAL_TABLET | ORAL | Status: DC | PRN

## 2016-12-02 MED ORDER — AMLODIPINE BESYLATE 10 MG PO TABS
10.0000 mg | ORAL_TABLET | Freq: Every day | ORAL | Status: DC
  Administered 2016-12-02 – 2016-12-03 (×2): 10 mg via ORAL
  Filled 2016-12-02 (×2): qty 1

## 2016-12-02 MED ORDER — LATANOPROST 0.005 % OP SOLN
1.0000 [drp] | Freq: Every day | OPHTHALMIC | Status: DC
  Administered 2016-12-02: 1 [drp] via OPHTHALMIC
  Filled 2016-12-02: qty 2.5

## 2016-12-02 MED ORDER — ROSUVASTATIN CALCIUM 5 MG PO TABS
10.0000 mg | ORAL_TABLET | Freq: Every day | ORAL | Status: DC
  Administered 2016-12-02: 10 mg via ORAL
  Filled 2016-12-02: qty 2

## 2016-12-02 MED ORDER — ASPIRIN EC 81 MG PO TBEC
81.0000 mg | DELAYED_RELEASE_TABLET | Freq: Every day | ORAL | Status: DC
  Administered 2016-12-03: 81 mg via ORAL
  Filled 2016-12-02: qty 1

## 2016-12-02 MED ORDER — NICOTINE 21 MG/24HR TD PT24
21.0000 mg | MEDICATED_PATCH | Freq: Every day | TRANSDERMAL | Status: DC
  Administered 2016-12-03: 21 mg via TRANSDERMAL
  Filled 2016-12-02: qty 1

## 2016-12-02 MED ORDER — ACETAMINOPHEN 160 MG/5ML PO SOLN: 650.0000 mg | ORAL | Status: DC | PRN

## 2016-12-02 MED ORDER — ZOLPIDEM TARTRATE 5 MG PO TABS: 5.0000 mg | ORAL_TABLET | Freq: Every evening | ORAL | Status: DC | PRN

## 2016-12-02 MED ORDER — ALPRAZOLAM 0.5 MG PO TABS: 0.5000 mg | ORAL_TABLET | Freq: Two times a day (BID) | ORAL | Status: DC | PRN

## 2016-12-02 MED ORDER — HEPARIN SODIUM (PORCINE) 5000 UNIT/ML IJ SOLN
5000.0000 [IU] | Freq: Three times a day (TID) | INTRAMUSCULAR | Status: DC
  Administered 2016-12-02 – 2016-12-03 (×3): 5000 [IU] via SUBCUTANEOUS
  Filled 2016-12-02 (×3): qty 1

## 2016-12-02 MED ORDER — SENNOSIDES-DOCUSATE SODIUM 8.6-50 MG PO TABS: 1.0000 | ORAL_TABLET | Freq: Every evening | ORAL | Status: DC | PRN

## 2016-12-02 MED ORDER — LORATADINE 10 MG PO TABS
10.0000 mg | ORAL_TABLET | Freq: Every day | ORAL | Status: DC
  Administered 2016-12-02 – 2016-12-03 (×2): 10 mg via ORAL
  Filled 2016-12-02 (×2): qty 1

## 2016-12-02 MED ORDER — CALCIUM CARBONATE ANTACID 500 MG PO CHEW
1.0000 | CHEWABLE_TABLET | Freq: Three times a day (TID) | ORAL | Status: DC
  Administered 2016-12-03 (×2): 200 mg via ORAL
  Filled 2016-12-02 (×3): qty 1

## 2016-12-02 MED ORDER — STROKE: EARLY STAGES OF RECOVERY BOOK: Freq: Once | Status: DC

## 2016-12-02 MED ORDER — PAROXETINE HCL 20 MG PO TABS
40.0000 mg | ORAL_TABLET | Freq: Every day | ORAL | Status: DC
  Administered 2016-12-03: 40 mg via ORAL
  Filled 2016-12-02 (×2): qty 2

## 2016-12-02 MED ORDER — INSULIN ASPART 100 UNIT/ML ~~LOC~~ SOLN: 0.0000 [IU] | Freq: Every day | SUBCUTANEOUS | Status: DC

## 2016-12-02 MED ORDER — ATENOLOL 25 MG PO TABS
25.0000 mg | ORAL_TABLET | Freq: Every day | ORAL | Status: DC
  Administered 2016-12-02 – 2016-12-03 (×2): 25 mg via ORAL
  Filled 2016-12-02 (×2): qty 1

## 2016-12-02 MED ORDER — PANTOPRAZOLE SODIUM 40 MG PO TBEC
40.0000 mg | DELAYED_RELEASE_TABLET | Freq: Every day | ORAL | Status: DC
  Administered 2016-12-03: 40 mg via ORAL
  Filled 2016-12-02: qty 1

## 2016-12-02 NOTE — Progress Notes (Signed)
Patient arrived to unit.  Assisted into bed.  Alert, oriented.  No complaints of pain or discomfort.

## 2016-12-02 NOTE — ED Notes (Signed)
Attempted to call x 2 to [facility]. Informed receiving nurse is in a room, and will call back in 5 minutes for report.

## 2016-12-02 NOTE — ED Provider Notes (Signed)
[facility] DEPT Provider Note   CSN: [id number] Arrival date & time: 12/02/16  0815     History   Chief Complaint Chief Complaint  Patient presents with  . Altered Mental Status    HPI [name] is a 71 y.o. female hx of depression, HL, HTN, CK D here presenting with confusion, trouble walking. Last normal was 2 PM yesterday the patient states that she went to bed around 9 PM and felt fine. This morning she woke around 8 AM and felt a little dizzy. Patient was able to walk to her brother's house and he noticed that she was confused and had trouble getting up the stairs. Denies any slurred speech or any focal weakness or numbness. Patient was noted to be alert and oriented 1 in triage. Patient had previous stroke. Patient has a history of CKD and is on the list for kidney transplant, no currently on dialysis.   The history is provided by the patient.    Past Medical History:  Diagnosis Date  . Anxiety   . Arthritis    lower back and knees   . Asthma    childhood  . Barrett's esophagus   . Depression   . Diabetes mellitus   . Dysphagia   . Eczema   . History of herpes zoster 02/2010   Recovered fully after period of acute herpetic neuralgia tx w/ gabapentin.   Marland Kitchen Hx MRSA infection 2009  . Hyperlipidemia   . Hypertension   . Neuromuscular disorder (HCC)    chronic pain  . Personal history of colonic polyps 10/17/2010   hyperplastic    Patient Active Problem List   Diagnosis Date Noted  . Memory changes 05/29/2016  . Stroke syndrome ([place]) 05/29/2016  . Mild single current episode of major depressive disorder ([place]) 05/29/2016  . Osteoarthritis of right knee 02/14/2016  . Current smoker 01/06/2016  . Headache 06/24/2015  . Migraine 06/24/2015  . Chronic knee pain 10/13/2014  . Eczema   . Arthritis   . Anxiety   . Iron deficiency anemia due to chronic blood loss 09/05/2013  . Microalbuminuria 09/05/2013  . Toe pain, left 08/05/2013  . Lipoma of back  07/31/2013  . Tobacco abuse 10/18/2012  . Chronic kidney disease 06/11/2012  . Dermatitis 04/30/2012  . Barrett esophagus 11/02/2010  . GERD (gastroesophageal reflux disease) with acute nausea  09/29/2010  . 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 HERPES ZOSTER 03/25/2010  . Hyperlipidemia 10/30/2008  . Depression 07/16/2008  . INSOMNIA UNSPECIFIED 12/19/2007  . Essential hypertension 11/04/2007    Past Surgical History:  Procedure Laterality Date  . ABDOMINAL HYSTERECTOMY     unclear when  . CATARACT EXTRACTION Bilateral   . thigh surg     left side due to MRSA    OB History    No data available       Home Medications    Prior to Admission medications   Medication Sig Start Date End Date Taking? Authorizing Provider  ALPRAZolam Duanne Moron) 0.5 MG tablet take 1 tablet by mouth twice a day if needed for anxiety 09/28/16   [name], NP  AMBULATORY NON FORMULARY MEDICATION Trum[employer] Test Strips Use to test blood sugar twice daily. Dx E11.49 01/26/15   [name], NP  amLODipine (NORVASC) 10 MG tablet Take 1 tablet (10 mg total) by mouth daily. 09/29/16   [name], NP  aspirin EC 81 MG tablet Take 1 tablet (81  mg total) by mouth daily. 03/15/12   Hoyt Koch, MD  atenolol (TENORMIN) 25 MG tablet Take 1 tablet (25 mg total) by mouth daily. 09/29/16   [name], NP  Blood Glucose Monitoring Suppl (TRUE METRIX AIR GLUCOSE METER) w/Device KIT CHECK BLOOD SUGAR TWICE DAILY 03/03/16   [name], NP  Cholecalciferol (VITAMIN D3) 5000 units TABS Take by mouth daily.    [provider]  clobetasol cream (TEMOVATE) 1.69 % Apply 1 application topically at bedtime.  09/03/13   [provider]  diclofenac sodium (VOLTAREN) 1 % GEL Apply 4 g topically 4 (four) times daily as needed. 06/03/15   [name], NP  glucose blood (TRUE METRIX BLOOD GLUCOSE TEST) test strip CHECK  BLOOD SUGAR TWICE DAILY 03/22/16   [name], NP  hydrOXYzine (ATARAX/VISTARIL) 10 MG tablet Take 10 mg by mouth every 6 (six) hours as needed for itching.    [provider]  latanoprost (XALATAN) 0.005 % ophthalmic solution Place 1 drop into both eyes daily. Reported on 08/27/2015 04/23/12   [provider]  linagliptin (TRADJENTA) 5 MG TABS tablet Take 1 tablet (5 mg total) by mouth daily. 06/26/16   [name], NP  loratadine (CLARITIN) 10 MG tablet Take 10 mg by mouth as needed. 06/11/12   Rosalia Hammers, MD  omeprazole (PRILOSEC) 40 MG capsule take 1 capsule by mouth once daily 06/05/16   [name], NP  PARoxetine (PAXIL) 40 MG tablet take 1 tablet by mouth once daily 10/30/16   [name], NP  rosuvastatin (CRESTOR) 10 MG tablet Take 1 tablet (10 mg total) by mouth daily. 09/28/16   [name], NP  SUMAtriptan (IMITREX) 25 MG tablet 1 tablet daily as needed for headache, May repeat in 2 hours if headache persists or recurs. Max of 2 tablets in 24 hours 07/09/15   Gildardo Cranker, DO  traMADol Veatrice Bourbon) 50 MG tablet 1 by mouth every 6 hours as needed 02/19/15   Gildardo Cranker, DO  triamcinolone cream (KENALOG) 0.1 % Apply to affected area 2-3 times daily 04/14/15   [provider]  TRUEPLUS LANCETS 33G MISC CHECK BLOOD SUGAR TWICE DAILY 03/22/16   [name], NP  zolpidem (AMBIEN) 5 MG tablet Take 1 tablet (5 mg total) by mouth at bedtime as needed for sleep. 09/28/16   [name], NP    Family History Family History  Problem Relation Age of Onset  . Hypertension Father   . Kidney disease Father   . Diabetes Brother   . Colon cancer Neg Hx   . CAD Neg Hx     Social History Social History  Substance Use Topics  . Smoking status: Current Every Day Smoker    Packs/day: 0.25    Years: 40.00    Types: Cigarettes  . Smokeless tobacco: Never Used     Comment: smokes 1 pack of cigerettes a week- off and on  .  Alcohol use 1.2 oz/week    2 Standard drinks or equivalent per week     Comment: wine and beer- occasionally      Allergies   Lisinopril; Morphine and related; Pioglitazone; and Sulfonamide derivatives   Review of Systems Review of Systems  Neurological: Positive for dizziness.  All other systems reviewed and are negative.    Physical Exam Updated Vital Signs BP (!) 145/89   Pulse 64   Temp 98.1 F (36.7 C) (Oral)   Resp (!) 22  SpO2 99%   Physical Exam  Constitutional:  Chronically ill   HENT:  Head: Normocephalic.  Eyes: Pupils are equal, round, and reactive to light.  Neck: Normal range of motion. Neck supple.  Cardiovascular: Normal rate, regular rhythm and normal heart sounds.   Pulmonary/Chest: Effort normal and breath sounds normal. No respiratory distress. She has no wheezes.  Abdominal: Soft. Bowel sounds are normal. She exhibits no distension. There is no tenderness.  Musculoskeletal: Normal range of motion.  Neurological: She is alert.  A & o x 2. CN 2- 12 intact. No obvious facial droop. Nl strength throughout. Nl finger to nose. Slow, shuffling gait, requires 2 people assist   Skin: Skin is warm.  Psychiatric: She has a normal mood and affect.  Nursing note and vitals reviewed.    ED Treatments / Results  Labs (all labs ordered are listed, but only abnormal results are displayed) Labs Reviewed  I-STAT CHEM 8, ED - Abnormal; Notable for the following:       Result Value   Chloride 113 (*)    BUN 62 (*)    Creatinine, Ser 8.80 (*)    Hemoglobin 8.8 (*)    HCT 26.0 (*)    All other components within normal limits  COMPREHENSIVE METABOLIC PANEL  CBC  PROTIME-INR  APTT  DIFFERENTIAL  CBG MONITORING, ED  I-STAT TROPONIN, ED  CBG MONITORING, ED    EKG  EKG Interpretation  Date/Time:  Saturday December 02 2016 08:38:06 EDT Ventricular Rate:  68 PR Interval:  160 QRS Duration: 78 QT Interval:  400 QTC Calculation: 425 R Axis:   24 Text  Interpretation:  Normal sinus rhythm Nonspecific ST and T wave abnormality Abnormal ECG No previous ECGs available Confirmed by Wandra Arthurs [phone]) on 12/02/2016 8:51:14 AM       Radiology No results found.  Procedures Procedures (including critical care time)  Medications Ordered in ED Medications - No data to display   Initial Impression / Assessment and Plan / ED Course  I have reviewed the triage vital signs and the nursing notes.  Pertinent labs & imaging results that were available during my care of the patient were reviewed by me and considered in my medical decision making (see chart for details).     IZEL HOCHBERG is a 71 y.o. female here with dizziness, trouble walking. Outside TPA window. Talked to Dr. Leonel Ramsay at 9 am who doesn't think that she is an interventional candidate either so no code stroke recommended. Will get labs, CT head, MRI brain.   1:46 PM MRI brain showed multiple strokes. Dr. Leonel Ramsay to see patient and recommend admission. She got dressed and I told her to get back in to the gown. Passed swallow eval. Will admit for stroke workup.      Final Clinical Impressions(s) / ED Diagnoses   Final diagnoses:  None    New Prescriptions New Prescriptions   No medications on file     Drenda Freeze, MD 12/02/16 1348

## 2016-12-02 NOTE — Progress Notes (Signed)
Family member, Cherene Julian  Called to pick up medications, stated the he cannot pick up medications, but will come in the morning.

## 2016-12-02 NOTE — ED Notes (Signed)
Patient removed all monitoring devices (blood pressure, pulse ox, cardiac monitoring leads), gown. Dressed and sitting in chair. Reports that she is "ready to go" and "tell the doctor to hurry up." Patient unwilling to have monitoring devices reapplied. Will notify EDP.

## 2016-12-02 NOTE — ED Notes (Signed)
Patient undressed. Placed in gown. Leads, blood pressure cuff, pulse ox reapplied. Patient placed back in bed. Discussed with patient the importance of being monitored. Patient verbalized understanding and agreement not to remove devices again.

## 2016-12-02 NOTE — ED Triage Notes (Signed)
Pt to ER brought by brother after having found her standing on the porch of the home, lives alone and fully functional, states she was very confused, pt is alert to self only at this time. States gait was slightly unsteady this morning. Brother states LSN yesterday at 2pm. States the only hx he is aware of is CKD. VSS at triage.

## 2016-12-02 NOTE — Consult Note (Signed)
Requesting Physician: Dr. Darl Householder    Chief Complaint: AMS  History obtained from:  Patient and Chart   HPI:                                                                                                                                      [name] is an 71 y.o. female with a past medical history significant for CVA, depression, hyperlipidemia, hypertension, chronic pain, CKD presents to [facility] with confusion. She states that she woke up this morning around 0800 and felt dizzy. She walked to her brother's house earlier than normal where he noticed that she was confused, speaking about events yesterday as if they happened today, and had some difficulty walking up the stairs. Her brother brought her to [facility] for further evaluation and treatment.  On my visit, [name] was fully clothed and sitting in the bedside chair. She had removed all monitoring devices and wanted to go home.  I discussed with her and her brother the MRI findings and stated that we would like to admit her for a stroke workup, to which she was amenable. She stated that she "just get[s] agitated sometimes". She stated that she felt "good". She was amenable to further questioning and exam, although much of her conversation was wandering and unfocused.  At baseline, [name] is independent with her ADLs. She denies headache, visual disturbance, nausea/vomiting. Minimal dizziness. She has a slow, ataxic gait, which she states has been the case for a "long time", but her brother does not acknowledge this as true. She also endorses some trouble with peripheral vision.   Pertinent Imaging: CT head 12/02/16: No acute process. Small vessel disease. Right posterior parietal and left parietoccipital infarcts. MR brain 12/02/16: Three punctate areas of acute nonhemorrhagic infarction in the posterior left MCA territory involving the left frontal and parietal lobes. Remote lacunar infarcts of the cerebellum and basal ganglia bilaterally.  Atrophy and white matter disease.  Date last known well: Date: 12/01/2016 Time last known well: Time: 14:00  tPA Given: No: OOW  Modified Rankin Score: 1  Stroke Risk Factors - hyperlipidemia, hypertension and smoking   Past Medical History:  Diagnosis Date  . Anxiety   . Arthritis    lower back and knees   . Asthma    childhood  . Barrett's esophagus   . Depression   . Diabetes mellitus   . Dysphagia   . Eczema   . History of herpes zoster 02/2010   Recovered fully after period of acute herpetic neuralgia tx w/ gabapentin.   Marland Kitchen Hx MRSA infection 2009  . Hyperlipidemia   . Hypertension   . Neuromuscular disorder (HCC)    chronic pain  . Personal history of colonic polyps 10/17/2010   hyperplastic    Past Surgical History:  Procedure Laterality Date  . ABDOMINAL HYSTERECTOMY     unclear when  . CATARACT EXTRACTION Bilateral   .  thigh surg     left side due to MRSA    Family History  Problem Relation Age of Onset  . Hypertension Father   . Kidney disease Father   . Diabetes Brother   . Colon cancer Neg Hx   . CAD Neg Hx      reports that she has been smoking Cigarettes.  She has a 10.00 pack-year smoking history. She has never used smokeless tobacco. She reports that she drinks about 1.2 oz of alcohol per week . She reports that she does not use drugs.  Allergies  Allergen Reactions  . Lisinopril Other (See Comments)    Abnormal Kidney Function   . Morphine And Related     nauseated  . Pioglitazone     REACTION: Desquamation of skin of the palm  . Sulfonamide Derivatives     rash    Medications:                                                                                                                       No outpatient prescriptions have been marked as taking for the 12/02/16 encounter [facility] Encounter).    Review Of Systems:                                                                                                           History  obtained from the patient  General: Negative for chills, fever Psychological: Negative for any known behavioral disorder Ophthalmic: Negative for blurry or double vision, loss of vision in any field or eye pain ENT: Negative for epistaxis, nasal discharge, oral lesions, sore throat, abrupt loss of hearing, tinnitus or vertigo Respiratory: Negative for shortness of breath or wheezing Cardiovascular: Negative for chest pain Gastrointestinal: Negative for abdominal pain Musculoskeletal: Negative for joint swelling or muscular weakness Neurological: As noted in HPI Dermatological: Negative for rash or skin changes, numbness or tingling  Blood pressure (!) 156/85, pulse (!) 57, temperature 98.1 F (36.7 C), temperature source Oral, resp. rate 19, SpO2 100 %.   Physical Examination:  General: WDWN female.  HEENT:  Normocephalic, no lesions, without obvious abnormality.  Normal external eye and conjunctiva.  Exophthalmos bilaterally. Normal external ears. Normal external nose, mucus membranes and septum.  Normal pharynx. Cardiovascular: Pulses palpable throughout   Pulmonary: Unlabored breathing Abdomen: Soft Extremities: no joint deformities, effusion, or inflammation and no edema Musculoskeletal: no joint tenderness, deformity or swelling  Skin: warm and dry, no hyperpigmentation, vitiligo, or suspicious lesions  Neurological Examination:                                                                                               Mental Status: [name] is alert, oriented to hospital and year.  Speech fluent without evidence of aphasia. Able to follow simple commands without difficulty when guided. Cranial Nerves: II: Visual fields grossly normal, pupils are equal, round, sluggishly reactive to light - cataract surgery approximately one year ago. III,IV, VI: Ptosis not present,  extra-ocular muscle movements intact bilaterally V,VII: Smile and eyebrow raise is symmetric. Facial light touch and pinprick sensation intact bilaterally VIII: Hearing grossly intact IX,X: Uvula and palate rise symmetrically XI: SCM and bilateral shoulder shrug strength 5/5 XII: Midline tongue extension Motor: Right :     Upper extremity   5/5   Left:     Upper extremity   5/5          Lower extremity   5/5    Lower extremity   5/5 Pronator drift not present Sensory: Pinprick and light touch intact throughout, bilaterally Deep Tendon Reflexes: 1+ and symmetric in the BUE. 3+ and symmetric throughout BLE - not extinguishable - I suspect that this was her kicking. Plantars: Right: downgoing   Left: downgoing Cerebellar: Finger-to-nose test without evidence of dysmetria or ataxia. Heel-to-shin test executed within normal limits. Gait: Deferred  Lab Results: Basic Metabolic Panel:  Recent Labs Lab 12/02/16 0835 12/02/16 0916  NA 140 143  K 4.6 4.6  CL 113* 113*  CO2 18*  --   GLUCOSE 100* 89  BUN 58* 62*  CREATININE 8.42* 8.80*  CALCIUM 8.8*  --     Liver Function Tests:  Recent Labs Lab 12/02/16 0835  AST 15  ALT 14  ALKPHOS 104  BILITOT 0.5  PROT 6.2*  ALBUMIN 3.1*   No results for input(s): LIPASE, AMYLASE in the last 168 hours. No results for input(s): AMMONIA in the last 168 hours.  CBC:  Recent Labs Lab 12/02/16 0835 12/02/16 0916  WBC 7.7  --   NEUTROABS 4.4  --   HGB 9.3* 8.8*  HCT 27.9* 26.0*  MCV 86.6  --   PLT 237  --     Cardiac Enzymes: No results for input(s): CKTOTAL, CKMB, CKMBINDEX, TROPONINI in the last 168 hours.  Lipid Panel: No results for input(s): CHOL, TRIG, HDL, CHOLHDL, VLDL, LDLCALC in the last 168 hours.  CBG:  Recent Labs Lab 12/02/16 4696  EXBMWU 13    Microbiology: Results for orders placed or performed in visit on 24/40/10  Helicobacter pylori screen-biopsy     Status: None   Collection Time: 10/19/10  1:48 PM  Result Value Ref Range Status   UREASE Negative Negative Final    Coagulation Studies:  Recent Labs  12/02/16 0835  LABPROT 13.0  INR 0.98    Imaging: Ct Head Wo Contrast  Result Date: 12/02/2016 CLINICAL DATA:  Unsteady gait.  Confusion. EXAM: CT HEAD WITHOUT CONTRAST TECHNIQUE: Contiguous axial images were obtained from the base of the skull through the vertex without intravenous contrast. COMPARISON:  01/19/2016 FINDINGS: Brain: Old infarcts involving the right posterior parietal (image 26, series 4) and left parietoccipital (image 22) lobes. Scattered periventricular hypodensities compatible with microvascular ischemic disease. No CT evidence of acute large territory infarct. No intraparenchymal or extra-axial mass or hemorrhage. Unchanged size and configuration of the ventricles and the basilar cisterns. No midline shift. Vascular: Intracranial atherosclerosis. Skull: No displaced calvarial fracture. Sinuses/Orbits: There is underpneumatization of the right frontal sinus. The remaining paranasal sinuses and mastoid air cells are normally aerated. No air-fluid levels. Post cataract surgery. Other: Regional soft tissues appear normal. IMPRESSION: 1. No acute intracranial process. 2. Similar findings of microvascular ischemic disease and pole right posterior parietal and left parietoccipital infarcts. Electronically Signed   By: Sandi Mariscal M.D.   On: 12/02/2016 10:32   Mr Brain Wo Contrast  Result Date: 12/02/2016 CLINICAL DATA:  TIA. Altered level of consciousness. Unsteady gait. Confusion. Last seen normal at 2 p.m. yesterday. EXAM: MRI HEAD WITHOUT CONTRAST TECHNIQUE: Multiplanar, multiecho pulse sequences of the brain and surrounding structures were obtained without intravenous contrast. COMPARISON:  CT head without contrast from the same day. MRI brain 03/05/2016. The FINDINGS: Brain: 3 punctate foci of restricted diffusion are present in the posterior left MCA territory. There is 1  cortical and 1 subcortical lesion in the posterior left parietal lobe. A single subcortical focus is present subjacent to the left precentral gyrus. T2 signal changes are associated with these areas. More remote encephalomalacia is present in the posterior left parietal and occipital lobe. High posterior right parietal encephalomalacia is chronic as well. Moderate generalized atrophy is present. Remote ischemic changes are evident within the basal ganglia and thalami bilaterally. Mild periventricular white matter changes are noted bilaterally. No acute hemorrhage or mass lesion is present. Internal auditory canals are normal bilaterally. The brainstem demonstrates white matter changes in the central pons. Remote lacunar infarcts in the cerebellum bilaterally are stable. Vascular: Flow is present in the major intracranial arteries. Skull and upper cervical spine: The skullbase is within normal limits. Midline sagittal structures are unremarkable. Sinuses/Orbits: The paranasal sinuses and mastoid air cells are clear. Bilateral lens replacements are present. Globes and orbits are otherwise unremarkable. IMPRESSION: 1. Three punctate areas of acute nonhemorrhagic infarction in the posterior left MCA territory involving the left frontal and parietal lobes. 2. Areas of remote encephalomalacia are again noted within the posterior left parietal and occipital lobe aunt high posterior right parietal lobe. 3. Advanced atrophy and white matter disease. 4. Remote lacunar infarcts of the cerebellum and basal ganglia bilaterally. Electronically Signed   By: San Morelle M.D.   On: 12/02/2016 13:07     Assessment and plan per attending neurologist.  Lupita Raider PA-C [facility] [phone]  12/02/2016, 1:38 PM  I have seen the patient reviewed the above note.  Assessment and Plan:   71 year old female with worsening gait and some altered mental status. I do wonder if the altered mental status  has a metabolic component, given the small size of the strokes but this could be contributing. Certainly, I think that it  could be responsible for her worsening gait unsteadiness. They are all in the distribution of the left MCA, possibly artery to artery embolus, or embolus that broke up.  1. HgbA1c, fasting lipid panel 2. Frequent neuro checks 3. Echocardiogram 4. Carotid dopplers given renal dysfunction 5. Prophylactic therapy-Antiplatelet med: Aspirin - dose 325mg  PO or 300mg  PR 6. Risk factor modification 7. Telemetry monitoring 8. PT consult, OT consult, Speech consult 9. MR angiogram head 10 please page stroke NP  Or  PA  Or MD  from 8am -4 pm as this patient will be followed by the stroke team at this point.   You can look them up on [email or web address]    Roland Rack, MD [facility] [phone]  If 7pm- 7am, please page neurology on call as listed in [place].

## 2016-12-02 NOTE — H&P (Addendum)
History and Physical    [name] JKD:[id number] DOB: April 01, 1946 DOA: 12/02/2016  PCP: [name], NP  Patient coming from: home  I have personally briefly reviewed patient's old medical records in [place]  Chief Complaint: confusion/stroke symptoms  HPI: [name] is [name] 71 y.o. female with medical history significant of stroke, chronic kidney disease, GERD, hypertension, hyperlipidemia who is presenting with confusion and difficulty walking. The history is obtained from the patient, but she was Maverik Foot poor historian due to her confusion. Patient's brother had left and unable to reach him by phone for collateral.  Per chart review the patient was last normal around 2 PM yesterday. When she woke up this morning she felt Peityn Payton little dizzy and when she got her brother's house he noticed that she was confused and had gait issues.  He brought her to the ED. She denies any slurred speech, numbness, weakness. She can tell me that she is in the hospital, but is not up to date. I was able unable to contact her brother for collateral information. Of note she has Tamma Brigandi history of kidney disease and is on the list for kidney transplant.   ED Course: the ED discussed with neurology, TPA not given as outside the window. labs, ct head, and mr brain ordered.  Review of Systems: As per HPI otherwise 10 point review of systems negative.  Review of Systems  Constitutional: Negative for chills and fever.  Eyes: Negative.   Respiratory: Negative for shortness of breath.   Cardiovascular: Negative for chest pain and leg swelling.  Gastrointestinal: Negative for abdominal pain, nausea and vomiting.  Genitourinary: Negative.   Musculoskeletal: Positive for joint pain.  Skin: Positive for rash.  Neurological: Positive for dizziness.  Psychiatric/Behavioral:       Confusion    Past Medical History:  Diagnosis Date  . Anxiety   . Arthritis    lower back and knees   . Asthma    childhood  .  Barrett's esophagus   . Depression   . Diabetes mellitus   . Dysphagia   . Eczema   . History of herpes zoster 02/2010   Recovered fully after period of acute herpetic neuralgia tx w/ gabapentin.   [name] Kitchen Hx MRSA infection 2009  . Hyperlipidemia   . Hypertension   . Neuromuscular disorder (HCC)    chronic pain  . Personal history of colonic polyps 10/17/2010   hyperplastic    Past Surgical History:  Procedure Laterality Date  . ABDOMINAL HYSTERECTOMY     unclear when  . CATARACT EXTRACTION Bilateral   . thigh surg     left side due to MRSA     reports that she has been smoking Cigarettes.  She has [name] 10.00 pack-year smoking history. She has never used smokeless tobacco. She reports that she drinks about 1.2 oz of alcohol per week . She reports that she does not use drugs.  Allergies  Allergen Reactions  . Lisinopril Other (See Comments)    Abnormal Kidney Function   . Morphine And Related     nauseated  . Pioglitazone     REACTION: Desquamation of skin of the palm  . Sulfonamide Derivatives     rash    Family History  Problem Relation Age of Onset  . Hypertension Father   . Kidney disease Father   . Diabetes Brother   . Colon cancer Neg Hx   . CAD Neg Hx    Prior to  Admission medications   Medication Sig Start Date End Date Taking? Authorizing Provider  ALPRAZolam Duanne Moron) 0.5 MG tablet take 1 tablet by mouth twice Ileah Falkenstein day if needed for anxiety 09/28/16   [name], NP  AMBULATORY NON FORMULARY MEDICATION [employer] Test Strips Use to test blood sugar twice daily. Dx E11.49 01/26/15   [name], NP  amLODipine (NORVASC) 10 MG tablet Take 1 tablet (10 mg total) by mouth daily. 09/29/16   [name], NP  aspirin EC 81 MG tablet Take 1 tablet (81 mg total) by mouth daily. 03/15/12   Hoyt Koch, MD  atenolol (TENORMIN) 25 MG tablet Take 1 tablet (25 mg total) by mouth daily. 09/29/16   [name], NP  Blood Glucose Monitoring Suppl (TRUE  METRIX AIR GLUCOSE METER) w/Device KIT CHECK BLOOD SUGAR TWICE DAILY 03/03/16   [name], NP  Cholecalciferol (VITAMIN D3) 5000 units TABS Take by mouth daily.    [provider]  clobetasol cream (TEMOVATE) 9.73 % Apply 1 application topically at bedtime.  09/03/13   [provider]  diclofenac sodium (VOLTAREN) 1 % GEL Apply 4 g topically 4 (four) times daily as needed. 06/03/15   [name], NP  glucose blood (TRUE METRIX BLOOD GLUCOSE TEST) test strip CHECK BLOOD SUGAR TWICE DAILY 03/22/16   [name], NP  hydrOXYzine (ATARAX/VISTARIL) 10 MG tablet Take 10 mg by mouth every 6 (six) hours as needed for itching.    [provider]  latanoprost (XALATAN) 0.005 % ophthalmic solution Place 1 drop into both eyes daily. Reported on 08/27/2015 04/23/12   [provider]  linagliptin (TRADJENTA) 5 MG TABS tablet Take 1 tablet (5 mg total) by mouth daily. 06/26/16   [name], NP  loratadine (CLARITIN) 10 MG tablet Take 10 mg by mouth as needed. 06/11/12   Rosalia Hammers, MD  omeprazole (PRILOSEC) 40 MG capsule take 1 capsule by mouth once daily 06/05/16   [name], NP  PARoxetine (PAXIL) 40 MG tablet take 1 tablet by mouth once daily 10/30/16   [name], NP  rosuvastatin (CRESTOR) 10 MG tablet Take 1 tablet (10 mg total) by mouth daily. 09/28/16   [name], NP  SUMAtriptan (IMITREX) 25 MG tablet 1 tablet daily as needed for headache, May repeat in 2 hours if headache persists or recurs. Max of 2 tablets in 24 hours 07/09/15   Gildardo Cranker, DO  traMADol Veatrice Bourbon) 50 MG tablet 1 by mouth every 6 hours as needed 02/19/15   Gildardo Cranker, DO  triamcinolone cream (KENALOG) 0.1 % Apply to affected area 2-3 times daily 04/14/15   [provider]  TRUEPLUS LANCETS 33G MISC CHECK BLOOD SUGAR TWICE DAILY 03/22/16   [name], NP  zolpidem (AMBIEN) 5 MG tablet Take 1 tablet (5 mg total) by mouth at bedtime as  needed for sleep. 09/28/16   [name], NP    Physical Exam: Vitals:   12/02/16 1200 12/02/16 1449 12/02/16 1530 12/02/16 1653  BP: (!) 156/85 (!) 174/100 (!) 180/94 (!) 187/107  Pulse: (!) 57 (!) 54 (!) 56 (!) 55  Resp: _0 Temp:    98.6 F (37 C)  TempSrc:    Oral  SpO2: 100% 99% 99% 100%  Weight:    78.8 kg (173 lb 11.6 oz)  Height:    _1  (1.651 m)    Constitutional: NAD, calm, comfortable Vitals:  12/02/16 1200 12/02/16 1449 12/02/16 1530 12/02/16 1653  BP: (!) 156/85 (!) 174/100 (!) 180/94 (!) 187/107  Pulse: (!) 57 (!) 54 (!) 56 (!) 55  Resp: _0 Temp:    98.6 F (37 C)  TempSrc:    Oral  SpO2: 100% 99% 99% 100%  Weight:    78.8 kg (173 lb 11.6 oz)  Height:    _1  (1.651 m)   Eyes: PERRL, lids and conjunctivae normal ENMT: Mucous membranes are moist. Posterior pharynx clear of any exudate or lesions.Normal dentition.  Neck: normal, supple, no masses, no thyromegaly Respiratory: clear to auscultation bilaterally, no wheezing, no crackles. Normal respiratory effort. No accessory muscle use.  Cardiovascular: Regular rate and rhythm, no murmurs / rubs / gallops. No extremity edema. 2+ pedal pulses. No carotid bruits.  Abdomen: no tenderness, no masses palpated. No hepatosplenomegaly. Bowel sounds positive.  Musculoskeletal: no clubbing / cyanosis. No joint deformity upper and lower extremities. Good ROM, no contractures. Normal muscle tone.  Skin: no rashes, lesions, ulcers. No induration Neurologic: CN 2-12 intact. Sensation intact,  Strength 5/5 in all 4.  Stumbles when walking. Psychiatric: [name]&Ox2, wandering conversation, diffucult to keep on track   Labs on Admission: I have personally reviewed following labs and imaging studies  CBC:  Recent Labs Lab 12/02/16 0835 12/02/16 0916  WBC 7.7  --   NEUTROABS 4.4  --   HGB 9.3* 8.8*  HCT 27.9* 26.0*  MCV 86.6  --   PLT 237  --    Basic Metabolic Panel:  Recent Labs Lab  12/02/16 0835 12/02/16 0916  NA 140 143  K 4.6 4.6  CL 113* 113*  CO2 18*  --   GLUCOSE 100* 89  BUN 58* 62*  CREATININE 8.42* 8.80*  CALCIUM 8.8*  --   PHOS 6.8*  --    GFR: Estimated Creatinine Clearance: 6.2 mL/min ([name]) (by C-G formula based on SCr of 8.8 mg/dL (H)). Liver Function Tests:  Recent Labs Lab 12/02/16 0835  AST 15  ALT 14  ALKPHOS 104  BILITOT 0.5  PROT 6.2*  ALBUMIN 3.1*   No results for input(s): LIPASE, AMYLASE in the last 168 hours. No results for input(s): AMMONIA in the last 168 hours. Coagulation Profile:  Recent Labs Lab 12/02/16 0835  INR 0.98   Cardiac Enzymes: No results for input(s): CKTOTAL, CKMB, CKMBINDEX, TROPONINI in the last 168 hours. BNP (last 3 results) No results for input(s): PROBNP in the last 8760 hours. HbA1C: No results for input(s): HGBA1C in the last 72 hours. CBG:  Recent Labs Lab 12/02/16 0837  GLUCAP 88   Lipid Profile: No results for input(s): CHOL, HDL, LDLCALC, TRIG, CHOLHDL, LDLDIRECT in the last 72 hours. Thyroid Function Tests: No results for input(s): TSH, T4TOTAL, FREET4, T3FREE, THYROIDAB in the last 72 hours. Anemia Panel: No results for input(s): VITAMINB12, FOLATE, FERRITIN, TIBC, IRON, RETICCTPCT in the last 72 hours. Urine analysis:    Component Value Date/Time   COLORURINE YELLOW 11/21/2012 1438   APPEARANCEUR CLEAR 11/21/2012 1438   LABSPEC 1.027 11/21/2012 1438   PHURINE 5.0 11/21/2012 1438   GLUCOSEU NEG 11/21/2012 1438   HGBUR NEG 11/21/2012 1438   BILIRUBINUR SMALL ([name]) 11/21/2012 1438   KETONESUR TRACE ([name]) 11/21/2012 1438   PROTEINUR > 300 ([name]) 11/21/2012 1438   UROBILINOGEN 0.2 11/21/2012 1438   NITRITE NEG 11/21/2012 1438   LEUKOCYTESUR MOD ([name]) 11/21/2012 1438    Radiological Exams on Admission: Ct Head Wo Contrast  Result Date: 12/02/2016 CLINICAL DATA:  Unsteady gait.  Confusion. EXAM: CT HEAD WITHOUT CONTRAST TECHNIQUE: Contiguous axial images were obtained from the base  of the skull through the vertex without intravenous contrast. COMPARISON:  01/19/2016 FINDINGS: Brain: Old infarcts involving the right posterior parietal (image 26, series 4) and left parietoccipital (image 22) lobes. Scattered periventricular hypodensities compatible with microvascular ischemic disease. No CT evidence of acute large territory infarct. No intraparenchymal or extra-axial mass or hemorrhage. Unchanged size and configuration of the ventricles and the basilar cisterns. No midline shift. Vascular: Intracranial atherosclerosis. Skull: No displaced calvarial fracture. Sinuses/Orbits: There is underpneumatization of the right frontal sinus. The remaining paranasal sinuses and mastoid air cells are normally aerated. No air-fluid levels. Post cataract surgery. Other: Regional soft tissues appear normal. IMPRESSION: 1. No acute intracranial process. 2. Similar findings of microvascular ischemic disease and pole right posterior parietal and left parietoccipital infarcts. Electronically Signed   By: Sandi Mariscal M.D.   On: 12/02/2016 10:32   Mr Brain Wo Contrast  Result Date: 12/02/2016 CLINICAL DATA:  TIA. Altered level of consciousness. Unsteady gait. Confusion. Last seen normal at 2 p.m. yesterday. EXAM: MRI HEAD WITHOUT CONTRAST TECHNIQUE: Multiplanar, multiecho pulse sequences of the brain and surrounding structures were obtained without intravenous contrast. COMPARISON:  CT head without contrast from the same day. MRI brain 03/05/2016. The FINDINGS: Brain: 3 punctate foci of restricted diffusion are present in the posterior left MCA territory. There is 1 cortical and 1 subcortical lesion in the posterior left parietal lobe. Landree Fernholz single subcortical focus is present subjacent to the left precentral gyrus. T2 signal changes are associated with these areas. More remote encephalomalacia is present in the posterior left parietal and occipital lobe. High posterior right parietal encephalomalacia is chronic as  well. Moderate generalized atrophy is present. Remote ischemic changes are evident within the basal ganglia and thalami bilaterally. Mild periventricular white matter changes are noted bilaterally. No acute hemorrhage or mass lesion is present. Internal auditory canals are normal bilaterally. The brainstem demonstrates white matter changes in the central pons. Remote lacunar infarcts in the cerebellum bilaterally are stable. Vascular: Flow is present in the major intracranial arteries. Skull and upper cervical spine: The skullbase is within normal limits. Midline sagittal structures are unremarkable. Sinuses/Orbits: The paranasal sinuses and mastoid air cells are clear. Bilateral lens replacements are present. Globes and orbits are otherwise unremarkable. IMPRESSION: 1. Three punctate areas of acute nonhemorrhagic infarction in the posterior left MCA territory involving the left frontal and parietal lobes. 2. Areas of remote encephalomalacia are again noted within the posterior left parietal and occipital lobe aunt high posterior right parietal lobe. 3. Advanced atrophy and white matter disease. 4. Remote lacunar infarcts of the cerebellum and basal ganglia bilaterally. Electronically Signed   By: San Morelle M.D.   On: 12/02/2016 13:07    EKG: Independently reviewed. Appears similar to prior. Normal sinus rhythm. Normal intervals. T-wave flattening in leads V4 through V6 as well as lead 3.  Assessment/Plan Principal Problem:   Stroke (cerebrum) (HCC) Active Problems:   Essential hypertension   GERD (gastroesophageal reflux disease) with acute nausea    DM (diabetes mellitus) type II controlled, neurological manifestation (HCC)   Chronic kidney disease  Nonhemorrhagic Stroke:  MRI with nonhemorrhagic infarction in L MCA territory with L frontal and parietal lobes.  Out of tpa window.  She's had MRI, will follow up with carotid dopplers, echo, telemetry, PT/OT/Speech.  Continue  ASA/rosuvastatin.  My exam with gait instability and  confusion.  Neurology has been consulted and is following.    CKD Stage 5 (CrCl 8): following with nephrology outpatient, no acute dialysis indication.  Follow up I/O's, daily BMP.  Low suspicion that uremia is contributing to confusion with findings c/w acute stroke.  Will follow up phosphorus, with low Ca, likely needs phos binder.   Will need outpatient follow up given worsening Cr.   HTN: continue amlodipine and atenolol  GERD: prilosec, pharm substitute protonix  T2DM: holding tradjenta.  SSI.   Anxiety/Depression/Insomnia: xanax/zolpidem cotinued  Nicotine dependence: patch  Passed ED swallow per ED note, diabetic diet ordered  DVT prophylaxis: heparin with CKD  Code Status: full  Family Communication: Unable to reach brother by phone  Disposition Plan: tele, 8/13 likely d/c Consults called: neurology, discussed with Solon [place]  Admission status: inpatient    Laylah Riga Melven Sartorius MD [facility] Pager [phone]  If 7PM-7AM, please contact night-coverage [email or web address] Password [facility]  12/02/2016, 5:32 PM

## 2016-12-02 NOTE — ED Notes (Signed)
Attempted to call report x 1 to 5 [place].

## 2016-12-03 ENCOUNTER — Inpatient Hospital Stay (HOSPITAL_COMMUNITY): Payer: Medicare Other

## 2016-12-03 ENCOUNTER — Inpatient Hospital Stay (HOSPITAL_BASED_OUTPATIENT_CLINIC_OR_DEPARTMENT_OTHER): Payer: Medicare Other

## 2016-12-03 DIAGNOSIS — N185 Chronic kidney disease, stage 5: Secondary | ICD-10-CM

## 2016-12-03 DIAGNOSIS — I638 Other cerebral infarction: Secondary | ICD-10-CM | POA: Diagnosis not present

## 2016-12-03 DIAGNOSIS — F419 Anxiety disorder, unspecified: Secondary | ICD-10-CM | POA: Diagnosis not present

## 2016-12-03 DIAGNOSIS — E1122 Type 2 diabetes mellitus with diabetic chronic kidney disease: Secondary | ICD-10-CM | POA: Diagnosis not present

## 2016-12-03 DIAGNOSIS — I639 Cerebral infarction, unspecified: Secondary | ICD-10-CM | POA: Diagnosis not present

## 2016-12-03 DIAGNOSIS — K449 Diaphragmatic hernia without obstruction or gangrene: Secondary | ICD-10-CM | POA: Diagnosis not present

## 2016-12-03 DIAGNOSIS — I10 Essential (primary) hypertension: Secondary | ICD-10-CM | POA: Diagnosis not present

## 2016-12-03 DIAGNOSIS — F329 Major depressive disorder, single episode, unspecified: Secondary | ICD-10-CM | POA: Diagnosis not present

## 2016-12-03 DIAGNOSIS — K219 Gastro-esophageal reflux disease without esophagitis: Secondary | ICD-10-CM | POA: Diagnosis not present

## 2016-12-03 DIAGNOSIS — Z794 Long term (current) use of insulin: Secondary | ICD-10-CM | POA: Diagnosis not present

## 2016-12-03 DIAGNOSIS — E1149 Type 2 diabetes mellitus with other diabetic neurological complication: Secondary | ICD-10-CM

## 2016-12-03 DIAGNOSIS — R4182 Altered mental status, unspecified: Secondary | ICD-10-CM | POA: Diagnosis not present

## 2016-12-03 DIAGNOSIS — I340 Nonrheumatic mitral (valve) insufficiency: Secondary | ICD-10-CM | POA: Diagnosis not present

## 2016-12-03 DIAGNOSIS — R41841 Cognitive communication deficit: Secondary | ICD-10-CM | POA: Diagnosis not present

## 2016-12-03 DIAGNOSIS — M47816 Spondylosis without myelopathy or radiculopathy, lumbar region: Secondary | ICD-10-CM | POA: Diagnosis not present

## 2016-12-03 DIAGNOSIS — I63512 Cerebral infarction due to unspecified occlusion or stenosis of left middle cerebral artery: Secondary | ICD-10-CM | POA: Diagnosis not present

## 2016-12-03 DIAGNOSIS — I120 Hypertensive chronic kidney disease with stage 5 chronic kidney disease or end stage renal disease: Secondary | ICD-10-CM | POA: Diagnosis not present

## 2016-12-03 LAB — COMPREHENSIVE METABOLIC PANEL
ALBUMIN: 3.1 g/dL — AB (ref 3.5–5.0)
ALT: 13 U/L — ABNORMAL LOW (ref 14–54)
AST: 13 U/L — AB (ref 15–41)
Alkaline Phosphatase: 102 U/L (ref 38–126)
Anion gap: 8 (ref 5–15)
BILIRUBIN TOTAL: 0.6 mg/dL (ref 0.3–1.2)
BUN: 58 mg/dL — ABNORMAL HIGH (ref 6–20)
CO2: 21 mmol/L — ABNORMAL LOW (ref 22–32)
Calcium: 8.8 mg/dL — ABNORMAL LOW (ref 8.9–10.3)
Chloride: 111 mmol/L (ref 101–111)
Creatinine, Ser: 7.99 mg/dL — ABNORMAL HIGH (ref 0.44–1.00)
GFR calc Af Amer: 5 mL/min — ABNORMAL LOW (ref >60)
GFR calc non Af Amer: 5 mL/min — ABNORMAL LOW (ref >60)
GLUCOSE: 95 mg/dL (ref 65–99)
POTASSIUM: 4.7 mmol/L (ref 3.5–5.1)
Sodium: 140 mmol/L (ref 135–145)
TOTAL PROTEIN: 6.3 g/dL — AB (ref 6.5–8.1)

## 2016-12-03 LAB — LIPID PANEL
CHOLESTEROL: 252 mg/dL — AB (ref 0–200)
HDL: 45 mg/dL (ref >40)
LDL Cholesterol: 151 mg/dL — ABNORMAL HIGH (ref 0–99)
Total CHOL/HDL Ratio: 5.6 RATIO
Triglycerides: 281 mg/dL — ABNORMAL HIGH (ref <150)
VLDL: 56 mg/dL — AB (ref 0–40)

## 2016-12-03 LAB — GLUCOSE, CAPILLARY
GLUCOSE-CAPILLARY: 86 mg/dL (ref 65–99)
Glucose-Capillary: 95 mg/dL (ref 65–99)
Glucose-Capillary: 94 mg/dL (ref 65–99)

## 2016-12-03 LAB — ECHOCARDIOGRAM COMPLETE
Height: 65 in
WEIGHTICAEL: 2779.56 [oz_av]

## 2016-12-03 MED ORDER — ASPIRIN EC 325 MG PO TBEC: 325.0000 mg | DELAYED_RELEASE_TABLET | Freq: Every day | ORAL | Status: DC

## 2016-12-03 MED ORDER — ROSUVASTATIN CALCIUM 5 MG PO TABS: 10.0000 mg | ORAL_TABLET | Freq: Every day | ORAL | Status: DC

## 2016-12-03 MED ORDER — EZETIMIBE 10 MG PO TABS: 10.0000 mg | ORAL_TABLET | Freq: Every day | ORAL | 11 refills | Status: DC

## 2016-12-03 MED ORDER — CLOPIDOGREL BISULFATE 75 MG PO TABS: 75.0000 mg | ORAL_TABLET | Freq: Every day | ORAL | Status: DC

## 2016-12-03 MED ORDER — ROSUVASTATIN CALCIUM 20 MG PO TABS: 40.0000 mg | ORAL_TABLET | Freq: Every day | ORAL | Status: DC

## 2016-12-03 MED ORDER — ASPIRIN 325 MG PO TBEC: 325.0000 mg | DELAYED_RELEASE_TABLET | Freq: Every day | ORAL | 0 refills | Status: DC

## 2016-12-03 MED ORDER — CLOPIDOGREL BISULFATE 75 MG PO TABS: 75.0000 mg | ORAL_TABLET | Freq: Every day | ORAL | 0 refills | Status: DC

## 2016-12-03 MED ORDER — CLOPIDOGREL BISULFATE 75 MG PO TABS
300.0000 mg | ORAL_TABLET | Freq: Once | ORAL | Status: AC
  Administered 2016-12-03: 300 mg via ORAL
  Filled 2016-12-03: qty 4

## 2016-12-03 NOTE — Evaluation (Signed)
Physical Therapy Evaluation Patient Details Name: [name] MRN: [id number] DOB: March 16, 1946 Today's Date: 12/03/2016   History of Present Illness  Pt is a 71 y.o. female admitted 12/02/16 with confusion and difficulty walking. MRI showed "Three punctate areas of acute nonhemorrhagic infarction in the posterior left MCA territory involving the left frontal and parietal lobes.    PMH:  CVA, CKD, HTN, HLD, anxiety, depression, arthritis, DM  Clinical Impression  Patient presents with problems listed below.  Will benefit from acute PT to maximize functional mobility prior to discharge.  Patient with decreased balance impacting gait/safety.  Recommend f/u HHPT at d/c for continued therapy.    Follow Up Recommendations Home health PT;Supervision - Intermittent    Equipment Recommendations  3in1 (PT)    Recommendations for Other Services       Precautions / Restrictions Precautions Precautions: Fall Restrictions Weight Bearing Restrictions: No      Mobility  Bed Mobility Overal bed mobility: Independent                Transfers Overall transfer level: Needs assistance Equipment used: None Transfers: Sit to/from Stand Sit to Stand: Supervision         General transfer comment: Assist for safety.  Ambulation/Gait Ambulation/Gait assistance: Min guard Ambulation Distance (Feet): 60 Feet Assistive device: None Gait Pattern/deviations: Step-through pattern;Decreased stride length;Shuffle;Staggering left;Drifts right/left Gait velocity: decreased Gait velocity interpretation: Below normal speed for age/gender General Gait Details: Patient with slow, unsteady gait.  Reaching for objects in room for support.  Patient with loss of balance during turn, requiring assist to maintain balance.  Stairs            Wheelchair Mobility    Modified Rankin (Stroke Patients Only) Modified Rankin (Stroke Patients Only) Pre-Morbid Rankin Score: No significant  disability Modified Rankin: Moderately severe disability     Balance Overall balance assessment: Needs assistance         Standing balance support: No upper extremity supported Standing balance-Leahy Scale: Fair                               Pertinent Vitals/Pain Pain Assessment: No/denies pain    Home Living Family/patient expects to be discharged to:: Private residence Living Arrangements: Alone Available Help at Discharge: Family;Friend(s);Available PRN/intermittently (Brother lives nearby) Type of Home: House Home Access: Stairs to enter Entrance Stairs-Rails: None Entrance Stairs-Number of Steps: 5 Home Layout: One level Home Equipment: Environmental consultant - 2 wheels (Walking stick)      Prior Function Level of Independence: Independent with assistive device(s)         Comments: Uses walking stick outside of home.  Brother provides transportation.     Hand Dominance   Dominant Hand: Right    Extremity/Trunk Assessment   Upper Extremity Assessment Upper Extremity Assessment: Defer to OT evaluation    Lower Extremity Assessment Lower Extremity Assessment: Generalized weakness       Communication   Communication: No difficulties (Soft speech)  Cognition Arousal/Alertness: Awake/alert Behavior During Therapy: Anxious Overall Cognitive Status: No family/caregiver present to determine baseline cognitive functioning                                 General Comments: Difficulty following commands. Decreased short-term memory.      General Comments      Exercises     Assessment/Plan    PT  Assessment Patient needs continued PT services  PT Problem List Decreased strength;Decreased activity tolerance;Decreased balance;Decreased mobility;Decreased cognition       PT Treatment Interventions DME instruction;Gait training;Stair training;Functional mobility training;Therapeutic activities;Therapeutic exercise;Balance training;Cognitive  remediation;Patient/family education    PT Goals (Current goals can be found in the Care Plan section)  Acute Rehab PT Goals Patient Stated Goal: To get better PT Goal Formulation: With patient Time For Goal Achievement: 12/10/16 Potential to Achieve Goals: Good    Frequency Min 4X/week   Barriers to discharge Decreased caregiver support Lives alone    Co-evaluation               AM-PAC PT "6 Clicks" Daily Activity  Outcome Measure Difficulty turning over in bed (including adjusting bedclothes, sheets and blankets)?: None Difficulty moving from lying on back to sitting on the side of the bed? : None Difficulty sitting down on and standing up from a chair with arms (e.g., wheelchair, bedside commode, etc,.)?: None Help needed moving to and from a bed to chair (including a wheelchair)?: A Little Help needed walking in hospital room?: A Little Help needed climbing 3-5 steps with a railing? : A Little 6 Click Score: 21    End of Session Equipment Utilized During Treatment: Gait belt Activity Tolerance: Patient limited by fatigue Patient left: in bed;with call bell/phone within reach;with bed alarm set Nurse Communication: Mobility status PT Visit Diagnosis: Unsteadiness on feet (R26.81);Other abnormalities of gait and mobility (R26.89);Other symptoms and signs involving the nervous system (R29.898)    Time: 1610-9604 PT Time Calculation (min) (ACUTE ONLY): 21 min   Charges:   PT Evaluation $PT Eval Moderate Complexity: 1 Mod     PT G Codes:   PT G-Codes **NOT FOR INPATIENT CLASS** Functional Assessment Tool Used: AM-PAC 6 Clicks Basic Mobility Functional Limitation: Mobility: Walking and moving around Mobility: Walking and Moving Around Current Status (V4098): At least 20 percent but less than 40 percent impaired, limited or restricted Mobility: Walking and Moving Around Goal Status [phone]): At least 1 percent but less than 20 percent impaired, limited or restricted     Carita Pian. Sanjuana Kava, [facility] Acute Rehab Services Pager [place] 12/03/2016, 6:53 PM

## 2016-12-03 NOTE — Progress Notes (Signed)
VASCULAR LAB PRELIMINARY  PRELIMINARY  PRELIMINARY  PRELIMINARY  Carotid duplex completed.    Preliminary report:  1-39% ICA plaquing. Vertebral artery flow is antegrade.   Sarin [place]un[place], RVT 12/03/2016, 4:15 PM

## 2016-12-03 NOTE — Evaluation (Signed)
Speech Language Pathology Evaluation Patient Details Name: MADELIS FLANNIGAN MRN: [id number] DOB: 08-18-1945 Today's Date: 12/03/2016 Time:  -     Problem List:  Patient Active Problem List   Diagnosis Date Noted  . Stroke (cerebrum) (HCC) 12/02/2016  . Memory changes 05/29/2016  . Stroke syndrome (HCC) 05/29/2016  . Mild single current episode of major depressive disorder (HCC) 05/29/2016  . Osteoarthritis of right knee 02/14/2016  . Current smoker 01/06/2016  . Headache 06/24/2015  . Migraine 06/24/2015  . Chronic knee pain 10/13/2014  . Eczema   . Arthritis   . Anxiety   . Iron deficiency anemia due to chronic blood loss 09/05/2013  . Microalbuminuria 09/05/2013  . Toe pain, left 08/05/2013  . Lipoma of back 07/31/2013  . Tobacco abuse 10/18/2012  . Chronic kidney disease 06/11/2012  . Dermatitis 04/30/2012  . Barrett esophagus 11/02/2010  . GERD (gastroesophageal reflux disease) with acute nausea  09/29/2010  . DM (diabetes mellitus) type II controlled, neurological manifestation (HCC) 09/29/2010  . HERPES ZOSTER 03/25/2010  . Hyperlipidemia 10/30/2008  . Depression 07/16/2008  . INSOMNIA UNSPECIFIED 12/19/2007  . Essential hypertension 11/04/2007   Past Medical History:  Past Medical History:  Diagnosis Date  . Anxiety   . Arthritis    lower back and knees   . Asthma    childhood  . Barrett's esophagus   . Depression   . Diabetes mellitus   . Dysphagia   . Eczema   . History of herpes zoster 02/2010   Recovered fully after period of acute herpetic neuralgia tx w/ gabapentin.   Marland Kitchen Hx MRSA infection 2009  . Hyperlipidemia   . Hypertension   . Neuromuscular disorder (HCC)    chronic pain  . Personal history of colonic polyps 10/17/2010   hyperplastic   Past Surgical History:  Past Surgical History:  Procedure Laterality Date  . ABDOMINAL HYSTERECTOMY     unclear when  . CATARACT EXTRACTION Bilateral   . thigh surg     left side due to MRSA   HPI:   Pt is a 71 y.o. female with PMH of stroke, chronic kidney disease, GERD, hypertension, hyperlipidemia who is presenting with confusion and difficulty walking. MRI showed "Three punctate areas of acute nonhemorrhagic infarction in the posterior left MCA territory involving the left frontal and parietal lobes. Areas of remote encephalomalacia are again noted within the  posterior left parietal and occipital lobe aunt high posterior right parietal lobe. Advanced atrophy and white matter disease."   Assessment / Plan / Recommendation Clinical Impression  Pt currently with mild cognitive deficits; pt reports feeling that this has improved from previous date but still "not at 100%". Pt showed decreased awareness of deficits, poor short-term memory (only recalled 1 out of 3 words after 5-minute delay), disorientation to time, decreased ability to follow multi-step/ complex directions. Pt reports having some baseline deficits with short-term memory. Motor speech/ expressive language skills seem WFL, although the pt is tangential in conversation. Pt would benefit from continued speech therapy while in acute care to monitor progress/ determine baseline and make further recommendations; pt may benefit from home health SLP for increased independence and safety at home.     SLP Assessment  SLP Recommendation/Assessment: Patient needs continued Speech Lanaguage Pathology Services SLP Visit Diagnosis: Cognitive communication deficit (R41.841)    Follow Up Recommendations  Home health SLP    Frequency and Duration min 1 x/week  1 week      SLP Evaluation Cognition  Overall Cognitive Status: No family/caregiver present to determine baseline cognitive functioning Arousal/Alertness: Awake/alert Orientation Level: Oriented to person;Oriented to place;Oriented to situation;Disoriented to time Attention: Selective Selective Attention: Appears intact Memory: Impaired Memory Impairment: Decreased recall of new  information;Decreased short term memory Decreased Short Term Memory: Verbal basic Awareness: Impaired Awareness Impairment: Anticipatory impairment Safety/Judgment:  (? decreased awareness of deficits)       Comprehension  Auditory Comprehension Overall Auditory Comprehension: Appears within functional limits for tasks assessed Yes/No Questions: Within Functional Limits Commands: Impaired Two Step Basic Commands: 75-100% accurate Multistep Basic Commands: 50-74% accurate Conversation: Complex    Expression Expression Primary Mode of Expression: Verbal Verbal Expression Overall Verbal Expression: Appears within functional limits for tasks assessed   Oral / Motor  Oral Motor/Sensory Function Overall Oral Motor/Sensory Function: Within functional limits Motor Speech Overall Motor Speech: Appears within functional limits for tasks assessed (some mumbling/ low vocal intensity- suspect baseline) Intelligibility: Intelligible   GO                    Jaquaveon Bilal Cecille Aver, MA, CCC-SLP 12/03/2016, 11:53 AM  [phone]

## 2016-12-03 NOTE — Progress Notes (Signed)
STROKE TEAM PROGRESS NOTE   HISTORY OF PRESENT ILLNESS (per record) [name] is a 71 y.o. female, pt of [name] MD at [facility],  with a past medical history significant for CVA, depression, hyperlipidemia, hypertension, chronic pain, CKD presents to [facility] with confusion. She states that she woke up this morning around 0800 and felt dizzy. She walked to her brother's house earlier than normal where he noticed that she was confused, speaking about events yesterday as if they happened today, and had some difficulty walking up the stairs. Her brother brought her to [facility] for further evaluation and treatment.  On my visit, [name] was fully clothed and sitting in the bedside chair. She had removed all monitoring devices and wanted to go home.  I discussed with her and her brother the MRI findings and stated that we would like to admit her for a stroke workup, to which she was amenable. She stated that she "just get[s] agitated sometimes". She stated that she felt "good". She was amenable to further questioning and exam, although much of her conversation was wandering and unfocused.  At baseline, [name] is independent with her ADLs. She denies headache, visual disturbance, nausea/vomiting. Minimal dizziness. She has a slow, ataxic gait, which she states has been the case for a "long time", but her brother does not acknowledge this as true. She also endorses some trouble with peripheral vision.   Pertinent Imaging: CT head 12/02/16: No acute process. Small vessel disease. Right posterior parietal and left parietoccipital infarcts. MR brain 12/02/16: Three punctate areas of acute nonhemorrhagic infarction in the posterior left MCA territory involving the left frontal and parietal lobes. Remote lacunar infarcts of the cerebellum and basal ganglia bilaterally. Atrophy and white matter disease.  Date last known well: Date: 12/01/2016 Time last known well: Time: 14:00  tPA  Given: No: OOW   SUBJECTIVE (INTERVAL HISTORY) No family present. The pt is alert and without complaints.   OBJECTIVE Temp:  [97.6 F (36.4 C)-98.6 F (37 C)] 98 F (36.7 C) (08/12 1029) Pulse Rate:  [53-68] 64 (08/12 1029) Cardiac Rhythm: Normal sinus rhythm (08/12 0700) Resp:  [13-20] 20 (08/12 1029) BP: (105-187)/(63-107) 154/81 (08/12 1029) SpO2:  [94 %-100 %] 100 % (08/12 1029) Weight:  [173 lb 11.6 oz (78.8 kg)] 173 lb 11.6 oz (78.8 kg) (08/11 1653)  CBC:   Recent Labs Lab 12/02/16 0835 12/02/16 0916  WBC 7.7  --   NEUTROABS 4.4  --   HGB 9.3* 8.8*  HCT 27.9* 26.0*  MCV 86.6  --   PLT 237  --     Basic Metabolic Panel:   Recent Labs Lab 12/02/16 0835 12/02/16 0916 12/03/16 0625  NA 140 143 140  K 4.6 4.6 4.7  CL 113* 113* 111  CO2 18*  --  21*  GLUCOSE 100* 89 95  BUN 58* 62* 58*  CREATININE 8.42* 8.80* 7.99*  CALCIUM 8.8*  --  8.8*  PHOS 6.8*  --   --     Lipid Panel:     Component Value Date/Time   CHOL 252 (H) 12/03/2016 0625   CHOL 270 (H) 08/27/2015 0937   TRIG 281 (H) 12/03/2016 0625   HDL 45 12/03/2016 0625   HDL 69 08/27/2015 0937   CHOLHDL 5.6 12/03/2016 0625   VLDL 56 (H) 12/03/2016 0625   LDLCALC 151 (H) 12/03/2016 0625   LDLCALC 165 (H) 08/27/2015 0937   HgbA1c:  Lab Results  Component Value Date  HGBA1C 5.6 09/28/2016   Urine Drug Screen: No results found for: LABOPIA, COCAINSCRNUR, LABBENZ, AMPHETMU, THCU, LABBARB  Alcohol Level No results found for: [place]  Dg Chest 2 View 12/02/2016 1. No acute cardiopulmonary process seen.  2. Moderate hiatal hernia noted.    Ct Head Wo Contrast 12/02/2016 1. No acute intracranial process.  2. Similar findings of microvascular ischemic disease and pole right posterior parietal and left parietoccipital infarcts.    Mr Jodene Nam Head Wo Contrast 12/03/2016 1. Left proximal M2 superior division short segment of mild stenosis.  2. Patent circle of Willis. No large vessel  occlusion, aneurysm, or high-grade stenosis is identified.   Mr Brain Wo Contrast 12/02/2016 1. Three punctate areas of acute nonhemorrhagic infarction in the posterior left MCA territory involving the left frontal and parietal lobes.  2. Areas of remote encephalomalacia are again noted within the posterior left parietal and occipital lobe aunt high posterior right parietal lobe.  3. Advanced atrophy and white matter disease.  4. Remote lacunar infarcts of the cerebellum and basal ganglia bilaterally.   Transthoracic echocardiogram 12/03/2016 Study Conclusions - Left ventricle: The cavity size was normal. Wall thickness was   increased in a pattern of severe LVH. Systolic function was   normal. The estimated ejection fraction was in the range of 60%   to 65%. Wall motion was normal; there were no regional wall   motion abnormalities. Doppler parameters are consistent with   abnormal left ventricular relaxation (grade 1 diastolic   dysfunction). - Mitral valve: There was mild regurgitation. - Left atrium: The atrium was mildly dilated. - Pulmonary arteries: PA peak pressure: 32 mm Hg (S). Impressions: - Normal LV systolic function; mild diastolic dysfunction; severe   LVH; mild MR; mild LAE.    PHYSICAL EXAM  General: Not in distress, cooperative Neck:  No bruit  CVS: pulse-normal rate and rhythm RS: breathing comfortably Extremities: normal   Neuro: MS: Alert, oriented, follows commands, depressed affect and poor effort/concentration CN: pupils equal and reactive,  EOMI, face symmetric, tongue midline, normal sensation over face, Motor: 5/5 strength in all 4 extremities Reflexes: 3+ bilaterally over R patella, ankle. 2+ over left patella, ankle. Bilateral plantars: extensor Coordination: normal Gait: not tested      ASSESSMENT/PLAN [name] is a 71 y.o. female with history of CVA, depression, tobacco use, diabetes mellitus, hyperlipidemia, hypertension,  chronic pain, CKD presenting with confusio, dizziness, and difficulty ambulating. She did not receive IV t-PA due to late presentation.  Stroke:  Left MCA territory, left frontal, and left parietal lobe infarcts - 2/2 ICAD vs embolic  CT head - No acute intracranial process.   MRI head - Three punctate areas of acute nonhemorrhagic infarction in the posterior left MCA territory involving the left frontal and parietal lobes.   MRA head - Left proximal M2 superior division short segment of mild stenosis.   Carotid Doppler -  1-39% ICA plaquing. Vertebral artery flow is antegrade.   2D Echo - EF 60-65%. No cardiac source of emboli identified.  LDL - 151  HgbA1c - pending  VTE prophylaxis - subcutaneous heparin Diet heart healthy/carb modified Room service appropriate? Yes; Fluid consistency: Thin  aspirin 81 mg daily prior to admission, now on aspirin 325 mg daily and Plavix 75 mg daily  Patient counseled to be compliant with her antithrombotic medications  Ongoing aggressive stroke risk factor management  Therapy recommendations:  pending  Disposition: Pending  Hypertension  Stable  Permissive hypertension (  OK if < 220/120) but gradually normalize in 5-7 days  Long-term BP goal normotensive  Hyperlipidemia  Home meds: Crestor 40 mg daily resumed in hospital  LDL 151, goal < 70  Consider increasing Crestor dose ( ? was taking Pravachol instead) - verify compliance  Continue statin at discharge  Diabetes  HgbA1c pending, goal < 7.0  Unc / Controlled  Other Stroke Risk Factors  Advanced age  Cigarette smoker - advised to stop smoking  ETOH use, advised to drink no more than 1 drink per day  Obesity, Body mass index is 28.91 kg/m., recommend weight loss, diet and exercise as appropriate   Hx stroke/TIA - by imaging    Other Active Problems  Anemia - 8.8 / 26.0  Chronic kidney disease - 22 / 7.99   PLAN  Dual antiplatelet therapy  recommended  Need to clarify medications pt. was taking PTA.  Hospital day # 1  I have seen and examined the patient. I have reviewed the chart, imaging and have gone over the plan and agree as above.  He shouldn't has multiple vascular risk factors presents with punctate infarcts in the left MCA territory. Vascular imaging shows a stenotic M2 branch of the left MCA. Carotid ultrasound is still pending. Mechanism is likely athero embolic versus cardioembolic. I have started her on dual antiplatelets after Plavix load a strong chance/point protocols for her minor stroke/TIA. I would ideally keep her on dual antiplatelets for 90 days if carotid ultrasound and telemetry is negative. We should also consider discharging the patient on a Holter monitor to assess for paroxysmal A. Fib. .The patient should also be on a high dose statin  The meantime the patient will need to be assessed by PT/OT to see if she can be safe at home. The patient lives alone.I was  approached by the pharmacy technician and she is concerned the patient does not know what medication she is taking and has dual prescriptions.    Karena Addison Emmilynn Marut MD [facility] [phone]  If 7pm to 7am, please call on call as listed on AMION.  To contact Stroke Continuity provider, please refer to [email or web address] After hours, contact General Neurology

## 2016-12-03 NOTE — Discharge Instructions (Signed)
Your cholesterol is high. Take a low cholesterol diet. Zetia has been added.   Please take all your medications with you for your next visit with your Primary MD. Please request your Primary MD to go over all hospital test results at the follow up. Please ask your Primary MD to get all Hospital records sent to his/her office.  If you experience worsening of your admission symptoms, develop shortness of breath, chest pain, suicidal or homicidal thoughts or a life threatening emergency, you must seek medical attention immediately by calling 911 or calling your MD.  Dennis Bast must read the complete instructions/literature along with all the possible adverse reactions/side effects for all the medicines you take including new medications that have been prescribed to you. Take new medicines after you have completely understood and accpet all the possible adverse reactions/side effects.   Do not drive when taking pain medications or sedatives.    Do not take more than prescribed Pain, Sleep and Anxiety Medications  If you have smoked or chewed Tobacco in the last 2 yrs please stop. Stop any regular alcohol and or recreational drug use.  Wear Seat belts while driving.

## 2016-12-03 NOTE — Progress Notes (Signed)
  Echocardiogram 2D Echocardiogram has been performed.  [name] 12/03/2016, 10:32 AM

## 2016-12-03 NOTE — Discharge Summary (Signed)
Physician Discharge Summary  [name] KGY:[id number] DOB: 1945-06-24 DOA: 12/02/2016  PCP: [name], NP  Admit date: 12/02/2016 Discharge date: 12/03/2016  Admitted From: home Disposition:  home   Recommendations for Outpatient Follow-up:  1. HHPT/ OT  Home Health:  yes  Discharge Condition:  stable   CODE STATUS:  Full code   Consultations:  neurology    Discharge Diagnoses:  Principal Problem:   Stroke (cerebrum) ([place]) Active Problems:   Essential hypertension   GERD (gastroesophageal reflux disease) with acute nausea    DM (diabetes mellitus) type II controlled, neurological manifestation (HCC)   CKD (chronic kidney disease) stage 5, GFR less than 15 ml/min (HCC)   CVA (cerebral vascular accident) ([place])   Nicotine abuse   Subjective: Asking if she can go home. States her symptoms have resolved and she has no new symptoms.   Brief Summary:  [name] is a 71 y.o. female with medical history significant of stroke, chronic kidney disease, GERD, hypertension, hyperlipidemia who is presenting with confusion and difficulty walking.  Per chart review the patient was last normal around 2 PM yesterday. When she woke up this morning she felt a little dizzy and when she got her brother's house he noticed that she was confused and had gait issues.  He brought her to the ED. MRI showed ischemic infarcts.    Left front and parietal infarcts (3) in MCA territory  -with gait imbalance and confusion- no dizziness at this time. Does not appear confused on exam.    CHOL 252 (H)  TRIG 281 (H)  HDL 45  CHOLHDL 5.6   - cont statin that she is already taking- add Zetia - Hb 1c 5.6 Carotid ultrasound>1-39% ICA plaquing. Vertebral artery flow is antegrade.  2 D ECHO> Normal LV systolic function; mild diastolic dysfunction; severe LVH; mild MR; mild LAE. - anticoagulation: previously on 81 mg ASA, Neuro recommends ASA 325 and Plavix 75 mg  Nicotine abuse - advised  to stop smoking- she tells me that she smokes 1pack every 2 wks  CKD 5 - outpt f/u  HTN - Amlodipine, Lisinopril and Atenolol  DM2 - Trajendta      Discharge Instructions  Discharge Instructions    Diet - low sodium heart healthy    Complete by:  As directed    Renal diet   Diet Carb Modified    Complete by:  As directed    Increase activity slowly    Complete by:  As directed      Allergies as of 12/03/2016      Reactions   Lisinopril Other (See Comments)   Abnormal Kidney Function    Morphine And Related Nausea And Vomiting   Pioglitazone Other (See Comments)   REACTION: Desquamation of skin of the palm   Sulfonamide Derivatives Rash      Medication List    STOP taking these medications   rosuvastatin 10 MG tablet Commonly known as:  CRESTOR   traMADol 50 MG tablet Commonly known as:  ULTRAM     TAKE these medications   ALPRAZolam 0.5 MG tablet Commonly known as:  XANAX take 1 tablet by mouth twice a day if needed for anxiety What changed:  how much to take  how to take this  when to take this  reasons to take this  additional instructions   AMBULATORY NON FORMULARY MEDICATION Trumetrix Test Strips Use to test blood sugar twice daily. Dx E11.49   amLODipine 10 MG  tablet Commonly known as:  NORVASC Take 1 tablet (10 mg total) by mouth daily.   aspirin 325 MG EC tablet Take 1 tablet (325 mg total) by mouth daily. What changed:  medication strength  how much to take   atenolol 25 MG tablet Commonly known as:  TENORMIN Take 1 tablet (25 mg total) by mouth daily. What changed:  Another medication with the same name was removed. Continue taking this medication, and follow the directions you see here.   clopidogrel 75 MG tablet Commonly known as:  PLAVIX Take 1 tablet (75 mg total) by mouth daily.   cromolyn 4 % ophthalmic solution Commonly known as:  OPTICROM Place 1 drop into both eyes 2 (two) times daily.   diclofenac sodium 1 %  Gel Commonly known as:  VOLTAREN Apply 4 g topically 4 (four) times daily as needed. What changed:  reasons to take this   ezetimibe 10 MG tablet Commonly known as:  ZETIA Take 1 tablet (10 mg total) by mouth daily.   glucose blood test strip Commonly known as:  TRUE METRIX BLOOD GLUCOSE TEST CHECK BLOOD SUGAR TWICE DAILY   latanoprost 0.005 % ophthalmic solution Commonly known as:  XALATAN Place 1 drop into both eyes at bedtime. Reported on 08/27/2015   linagliptin 5 MG Tabs tablet Commonly known as:  TRADJENTA Take 1 tablet (5 mg total) by mouth daily.   lisinopril 40 MG tablet Commonly known as:  PRINIVIL,ZESTRIL Take 40 mg by mouth daily.   loratadine 10 MG tablet Commonly known as:  CLARITIN Take 10 mg by mouth daily as needed for allergies.   omeprazole 40 MG capsule Commonly known as:  PRILOSEC take 1 capsule by mouth once daily What changed:  See the new instructions.   OVER THE COUNTER MEDICATION Take by mouth See admin instructions. Energizing Iron: Take 2 softgels by mouth once a day   PARoxetine 40 MG tablet Commonly known as:  PAXIL take 1 tablet by mouth once daily What changed:  See the new instructions.   pravastatin 40 MG tablet Commonly known as:  PRAVACHOL Take 40 mg by mouth at bedtime.   SUMAtriptan 25 MG tablet Commonly known as:  IMITREX 1 tablet daily as needed for headache, May repeat in 2 hours if headache persists or recurs. Max of 2 tablets in 24 hours   triamcinolone cream 0.1 % Commonly known as:  KENALOG Apply 1 application topically 3 (three) times daily as needed (rash).   TRUE METRIX AIR GLUCOSE METER w/Device Kit CHECK BLOOD SUGAR TWICE DAILY   TRUEPLUS LANCETS 33G Misc CHECK BLOOD SUGAR TWICE DAILY   Vitamin D3 5000 units Tabs Take 5,000 Units by mouth daily.   zolpidem 5 MG tablet Commonly known as:  AMBIEN Take 1 tablet (5 mg total) by mouth at bedtime as needed for sleep.       Allergies  Allergen Reactions   . Lisinopril Other (See Comments)    Abnormal Kidney Function   . Morphine And Related Nausea And Vomiting  . Pioglitazone Other (See Comments)    REACTION: Desquamation of skin of the palm  . Sulfonamide Derivatives Rash     Procedures/Studies: 2 D ECHO  - Normal LV systolic function; mild diastolic dysfunction; severe   LVH; mild MR; mild LAE.   Dg Chest 2 View  Result Date: 12/02/2016 CLINICAL DATA:  Status post CVA.  Initial encounter. EXAM: CHEST  2 VIEW COMPARISON:  Chest radiograph performed 09/25/2011, and CT of the chest  performed 01/19/2016 FINDINGS: The lungs are well-aerated and clear. There is no evidence of focal opacification, pleural effusion or pneumothorax. The heart is borderline normal in size. No acute osseous abnormalities are seen. A moderate hiatal hernia is noted. IMPRESSION: 1. No acute cardiopulmonary process seen. 2. Moderate hiatal hernia noted. Electronically Signed   By: Garald Balding M.D.   On: 12/02/2016 23:59   Ct Head Wo Contrast  Result Date: 12/02/2016 CLINICAL DATA:  Unsteady gait.  Confusion. EXAM: CT HEAD WITHOUT CONTRAST TECHNIQUE: Contiguous axial images were obtained from the base of the skull through the vertex without intravenous contrast. COMPARISON:  01/19/2016 FINDINGS: Brain: Old infarcts involving the right posterior parietal (image 26, series 4) and left parietoccipital (image 22) lobes. Scattered periventricular hypodensities compatible with microvascular ischemic disease. No CT evidence of acute large territory infarct. No intraparenchymal or extra-axial mass or hemorrhage. Unchanged size and configuration of the ventricles and the basilar cisterns. No midline shift. Vascular: Intracranial atherosclerosis. Skull: No displaced calvarial fracture. Sinuses/Orbits: There is underpneumatization of the right frontal sinus. The remaining paranasal sinuses and mastoid air cells are normally aerated. No air-fluid levels. Post cataract surgery.  Other: Regional soft tissues appear normal. IMPRESSION: 1. No acute intracranial process. 2. Similar findings of microvascular ischemic disease and pole right posterior parietal and left parietoccipital infarcts. Electronically Signed   By: Sandi Mariscal M.D.   On: 12/02/2016 10:32   Mr Jodene Nam Head Wo Contrast  Result Date: 12/03/2016 CLINICAL DATA:  71 y/o  F; acute ischemic stroke. EXAM: MRA HEAD WITHOUT CONTRAST TECHNIQUE: Angiographic images of the Circle of Willis were obtained using MRA technique without intravenous contrast. COMPARISON:  12/02/2016 MRI of the head. FINDINGS: Anterior circulation: No large vessel occlusion, aneurysm, or high-grade stenosis is identified. Left proximal M2 superior division short segment of mild stenosis (series 354 image 9). Prominent left inferolateral trunk infundibulum. Posterior circulation: No large vessel occlusion, aneurysm, or high-grade stenosis is identified. Anatomic variant: Large left A1, large anterior communicating artery, and small right A1, normal variant. Right fetal PCA. Patent diminutive left posterior communicating artery. IMPRESSION: 1. Left proximal M2 superior division short segment of mild stenosis. 2. Patent circle of Willis. No large vessel occlusion, aneurysm, or high-grade stenosis is identified. Electronically Signed   By: Kristine Garbe M.D.   On: 12/03/2016 06:44   Mr Brain Wo Contrast  Result Date: 12/02/2016 CLINICAL DATA:  TIA. Altered level of consciousness. Unsteady gait. Confusion. Last seen normal at 2 p.m. yesterday. EXAM: MRI HEAD WITHOUT CONTRAST TECHNIQUE: Multiplanar, multiecho pulse sequences of the brain and surrounding structures were obtained without intravenous contrast. COMPARISON:  CT head without contrast from the same day. MRI brain 03/05/2016. The FINDINGS: Brain: 3 punctate foci of restricted diffusion are present in the posterior left MCA territory. There is 1 cortical and 1 subcortical lesion in the posterior  left parietal lobe. A single subcortical focus is present subjacent to the left precentral gyrus. T2 signal changes are associated with these areas. More remote encephalomalacia is present in the posterior left parietal and occipital lobe. High posterior right parietal encephalomalacia is chronic as well. Moderate generalized atrophy is present. Remote ischemic changes are evident within the basal ganglia and thalami bilaterally. Mild periventricular white matter changes are noted bilaterally. No acute hemorrhage or mass lesion is present. Internal auditory canals are normal bilaterally. The brainstem demonstrates white matter changes in the central pons. Remote lacunar infarcts in the cerebellum bilaterally are stable. Vascular: Flow is present in the major  intracranial arteries. Skull and upper cervical spine: The skullbase is within normal limits. Midline sagittal structures are unremarkable. Sinuses/Orbits: The paranasal sinuses and mastoid air cells are clear. Bilateral lens replacements are present. Globes and orbits are otherwise unremarkable. IMPRESSION: 1. Three punctate areas of acute nonhemorrhagic infarction in the posterior left MCA territory involving the left frontal and parietal lobes. 2. Areas of remote encephalomalacia are again noted within the posterior left parietal and occipital lobe aunt high posterior right parietal lobe. 3. Advanced atrophy and white matter disease. 4. Remote lacunar infarcts of the cerebellum and basal ganglia bilaterally. Electronically Signed   By: San Morelle M.D.   On: 12/02/2016 13:07       Discharge Exam: Vitals:   12/03/16 0630 12/03/16 1029  BP: (!) 163/91 (!) 154/81  Pulse: 61 64  Resp:  20  Temp: 97.6 F (36.4 C) 98 F (36.7 C)  SpO2:  100%   Vitals:   12/03/16 0130 12/03/16 0332 12/03/16 0630 12/03/16 1029  BP: (!) 156/78 135/63 (!) 163/91 (!) 154/81  Pulse: 64 (!) 58 61 64  Resp: _0 Temp: 97.6 F (36.4 C) 97.8 F (36.6 C)  97.6 F (36.4 C) 98 F (36.7 C)  TempSrc: Oral Oral Oral Oral  SpO2: 95% 95%  100%  Weight:      Height:        General: Pt is alert, awake, not in acute distress Cardiovascular: RRR, S1/S2 +, no rubs, no gallops Respiratory: CTA bilaterally, no wheezing, no rhonchi Abdominal: Soft, NT, ND, bowel sounds + Extremities: no edema, no cyanosis    The results of significant diagnostics from this hospitalization (including imaging, microbiology, ancillary and laboratory) are listed below for reference.     Microbiology: No results found for this or any previous visit (from the past 240 hour(s)).   Labs: BNP (last 3 results) No results for input(s): BNP in the last 8760 hours. Basic Metabolic Panel:  Recent Labs Lab 12/02/16 0835 12/02/16 0916 12/03/16 0625  NA 140 143 140  K 4.6 4.6 4.7  CL 113* 113* 111  CO2 18*  --  21*  GLUCOSE 100* 89 95  BUN 58* 62* 58*  CREATININE 8.42* 8.80* 7.99*  CALCIUM 8.8*  --  8.8*  PHOS 6.8*  --   --    Liver Function Tests:  Recent Labs Lab 12/02/16 0835 12/03/16 0625  AST 15 13*  ALT 14 13*  ALKPHOS 104 102  BILITOT 0.5 0.6  PROT 6.2* 6.3*  ALBUMIN 3.1* 3.1*   No results for input(s): LIPASE, AMYLASE in the last 168 hours. No results for input(s): AMMONIA in the last 168 hours. CBC:  Recent Labs Lab 12/02/16 0835 12/02/16 0916  WBC 7.7  --   NEUTROABS 4.4  --   HGB 9.3* 8.8*  HCT 27.9* 26.0*  MCV 86.6  --   PLT 237  --    Cardiac Enzymes: No results for input(s): CKTOTAL, CKMB, CKMBINDEX, TROPONINI in the last 168 hours. BNP: Invalid input(s): POCBNP CBG:  Recent Labs Lab 12/02/16 1836 12/02/16 2152 12/03/16 0646 12/03/16 1134 12/03/16 1634  GLUCAP 72 113* 86 95 94   D-Dimer No results for input(s): DDIMER in the last 72 hours. Hgb A1c No results for input(s): HGBA1C in the last 72 hours. Lipid Profile  Recent Labs  12/03/16 0625  CHOL 252*  HDL 45  LDLCALC 151*  TRIG 281*  CHOLHDL 5.6    Thyroid function studies No results for  input(s): TSH, T4TOTAL, T3FREE, THYROIDAB in the last 72 hours.  Invalid input(s): FREET3 Anemia work up No results for input(s): VITAMINB12, FOLATE, FERRITIN, TIBC, IRON, RETICCTPCT in the last 72 hours. Urinalysis    Component Value Date/Time   COLORURINE YELLOW 11/21/2012 1438   APPEARANCEUR CLEAR 11/21/2012 1438   LABSPEC 1.027 11/21/2012 1438   PHURINE 5.0 11/21/2012 1438   GLUCOSEU NEG 11/21/2012 1438   HGBUR NEG 11/21/2012 1438   BILIRUBINUR SMALL (A) 11/21/2012 1438   KETONESUR TRACE (A) 11/21/2012 1438   PROTEINUR > 300 (A) 11/21/2012 1438   UROBILINOGEN 0.2 11/21/2012 1438   NITRITE NEG 11/21/2012 1438   LEUKOCYTESUR MOD (A) 11/21/2012 1438   Sepsis Labs Invalid input(s): PROCALCITONIN,  WBC,  LACTICIDVEN Microbiology No results found for this or any previous visit (from the past 240 hour(s)).   Time coordinating discharge: Over 30 minutes  SIGNED:   Debbe Odea, MD  [facility] 12/03/2016, 6:35 PM Pager   If 7PM-7AM, please contact night-coverage [email or web address] Password [email or web address]

## 2016-12-03 NOTE — Progress Notes (Addendum)
OT Cancellation   12/03/16 1100  OT Visit Information  Last OT Received On 12/03/16  Reason Eval/Treat Not Completed Patient at procedure or test/ unavailable - echo  [facility], OT/L  [phone] 12/03/2016

## 2016-12-03 NOTE — Progress Notes (Signed)
Pt off unit to MRA.

## 2016-12-04 ENCOUNTER — Other Ambulatory Visit: Payer: Self-pay | Admitting: Nurse Practitioner

## 2016-12-04 ENCOUNTER — Telehealth: Payer: Self-pay

## 2016-12-04 ENCOUNTER — Other Ambulatory Visit: Payer: Self-pay

## 2016-12-04 DIAGNOSIS — Z01810 Encounter for preprocedural cardiovascular examination: Secondary | ICD-10-CM

## 2016-12-04 DIAGNOSIS — N185 Chronic kidney disease, stage 5: Secondary | ICD-10-CM

## 2016-12-04 DIAGNOSIS — F418 Other specified anxiety disorders: Secondary | ICD-10-CM

## 2016-12-04 LAB — VAS US CAROTID
LCCADSYS: -65 cm/s
LEFT ECA DIAS: -7 cm/s
LEFT VERTEBRAL DIAS: 16 cm/s
LICADDIAS: -30 cm/s
LICADSYS: -96 cm/s
LICAPDIAS: -22 cm/s
Left CCA dist dias: -21 cm/s
Left CCA prox dias: 13 cm/s
Left CCA prox sys: 60 cm/s
Left ICA prox sys: -78 cm/s
RIGHT ECA DIAS: -16 cm/s
RIGHT VERTEBRAL DIAS: -19 cm/s
Right CCA prox dias: -20 cm/s
Right CCA prox sys: -78 cm/s
Right cca dist sys: -60 cm/s

## 2016-12-04 LAB — HEMOGLOBIN A1C
HEMOGLOBIN A1C: 5.2 % (ref 4.8–5.6)
MEAN PLASMA GLUCOSE: 103 mg/dL

## 2016-12-04 NOTE — Telephone Encounter (Signed)
I have made the 1st attempt to contact the patient or family member in charge, in order to follow up from recently being discharged from the hospital. No answer or voicemail set up, I will make another attempt at a different time.

## 2016-12-04 NOTE — Progress Notes (Signed)
12/04/2016 at 1405: able to reach patient at brothers number. CM inquired about [facility] PT/OT and provided her a list of agencies. She selected [facility]. Drew notified and accepted the referral. Pt c/o rash to body. She also seemed a little confused about her home medications. CM encouraged her to asked her brother for assist with her meds and to call her PCP for an appointment to look at rash and go over meds. Pt in agreement and able to recall PCP number.  CM asked HH to please check home meds with d/c paperwork and go over with patient also.

## 2016-12-04 NOTE — Progress Notes (Signed)
12/04/2016 1147am: received phone call from RN that patient was d/ced yesterday without her 3 in 1. Upon review of her chart patient did not have an order for a 3 in 1 but she does have orders for [facility] PT/OT. CM attempted to contact patient and unable to leave message. CM called both numbers for patients brother and unable to leave message. CM will attempt to reach patient or her brother again this pm.

## 2016-12-05 NOTE — Telephone Encounter (Signed)
I have made the 2nd attempt to contact the patient or family member in charge, in order to follow up from recently being discharged from the hospital.No answer with no VM set up,  but I will make another attempt at a different time.

## 2016-12-06 ENCOUNTER — Ambulatory Visit (INDEPENDENT_AMBULATORY_CARE_PROVIDER_SITE_OTHER): Payer: Medicare HMO | Admitting: Nurse Practitioner

## 2016-12-06 ENCOUNTER — Encounter: Payer: Self-pay | Admitting: Nurse Practitioner

## 2016-12-06 VITALS — BP 148/86 | HR 74 | Temp 97.5°F | Resp 18 | Ht 65.0 in | Wt 173.8 lb

## 2016-12-06 DIAGNOSIS — E1149 Type 2 diabetes mellitus with other diabetic neurological complication: Secondary | ICD-10-CM

## 2016-12-06 DIAGNOSIS — Z720 Tobacco use: Secondary | ICD-10-CM | POA: Diagnosis not present

## 2016-12-06 DIAGNOSIS — I639 Cerebral infarction, unspecified: Secondary | ICD-10-CM

## 2016-12-06 DIAGNOSIS — Z794 Long term (current) use of insulin: Secondary | ICD-10-CM

## 2016-12-06 DIAGNOSIS — N185 Chronic kidney disease, stage 5: Secondary | ICD-10-CM | POA: Diagnosis not present

## 2016-12-06 DIAGNOSIS — F418 Other specified anxiety disorders: Secondary | ICD-10-CM | POA: Diagnosis not present

## 2016-12-06 DIAGNOSIS — E782 Mixed hyperlipidemia: Secondary | ICD-10-CM | POA: Diagnosis not present

## 2016-12-06 MED ORDER — PANTOPRAZOLE SODIUM 40 MG PO TBEC
40.0000 mg | DELAYED_RELEASE_TABLET | Freq: Every day | ORAL | 3 refills | Status: DC
Start: 2016-12-06 — End: 2017-01-02

## 2016-12-06 MED ORDER — PRAVASTATIN SODIUM 40 MG PO TABS: 40.0000 mg | ORAL_TABLET | Freq: Every day | ORAL | 0 refills | Status: DC

## 2016-12-06 MED ORDER — EZETIMIBE 10 MG PO TABS: 10.0000 mg | ORAL_TABLET | Freq: Every day | ORAL | 0 refills | Status: DC

## 2016-12-06 MED ORDER — CLOPIDOGREL BISULFATE 75 MG PO TABS: 75.0000 mg | ORAL_TABLET | Freq: Every day | ORAL | 0 refills | Status: DC

## 2016-12-06 NOTE — Patient Instructions (Addendum)
Make sure you are taking ASA 325 mg with plavix 75 mg daily  ASA 325 mg is over the counter Add zetia to medication and make sure you are taking pravastatin  -- this helps your cholesterol preventing future stroke.   Stop Omeprazole (prilosec) - notify if having increase in acid reflux/indigestion

## 2016-12-06 NOTE — Progress Notes (Signed)
Careteam: Patient Care Team: [name], NP as PCP - General (Nurse Practitioner) Woodroe Mode (Inactive) as Diabetes Educator [name], MD as Consulting Physician (Ophthalmology) [name], MD as Consulting Physician (Nephrology)  Advanced Directive information Does Patient Have a Medical Advance Directive?: No  Allergies  Allergen Reactions  . Lisinopril Other (See Comments)    Abnormal Kidney Function   . Morphine And Related Nausea And Vomiting  . Pioglitazone Other (See Comments)    REACTION: Desquamation of skin of the palm  . Sulfonamide Derivatives Rash    Chief Complaint  Patient presents with  . Transitions Of Care    Pt is being seen after stay at [facility] 8/11 to 8/12 due to stroke. Pt is requesting help with managing which medications she is to be taking.      HPI: Patient is a 71 y.o. female seen in the office today to follow up hospitalization. Reports she does not feel as "stupid" has she did on Friday. medical history significant of stroke, chronic kidney disease, GERD, hypertension, hyperlipidemia who is presenting with confusion and difficulty walking. MRI showed ischemic infarct. Left front and parietal infarcts (3) in MCA territory per hospitalization.  Carotid ultrasound>1-39% ICA plaquing. Vertebral artery flow is antegrade.  2 D ECHO> Normal LV systolic function; mild diastolic dysfunction; severeLVH; mild MR; mild LAE.  Neurology recommended ASA 325 mg and plavix 75 mg States her mother and father died within the last year. She has had a lot going on.  Reports she is bipolar "or so they say" Following with nephrologist and getting set up for HD.  Using xanax at night.  Denies any deficits from CVA.   Has not been on tradjenta for several month- 3-4, A1c 5.2 on 12/04/16  Had not started any of the medication from the hospital because she was unsure why. She reports she has just been very overwhelmed with the recent passing  of her mom and the fact she has stage 5 kidney disease and is on the transplant list. Also not sure if she wants to do dialysis. Following with nephrology.   Review of Systems:  Review of Systems  Constitutional: Negative for chills, fever and weight loss.  HENT: Negative for tinnitus.   Respiratory: Negative for cough, sputum production and shortness of breath.   Cardiovascular: Negative for chest pain, palpitations and leg swelling.  Gastrointestinal: Negative for abdominal pain, constipation, diarrhea and heartburn.  Genitourinary: Negative for dysuria, frequency and urgency.  Musculoskeletal: Negative for back pain, falls, joint pain and myalgias.  Skin: Negative.   Neurological: Negative for dizziness and headaches.  Psychiatric/Behavioral: Positive for depression. Negative for suicidal ideas. The patient is nervous/anxious.     Past Medical History:  Diagnosis Date  . Anxiety   . Arthritis    lower back and knees   . Asthma    childhood  . Barrett's esophagus   . Depression   . Diabetes mellitus   . Dysphagia   . Eczema   . History of herpes zoster 02/2010   Recovered fully after period of acute herpetic neuralgia tx w/ gabapentin.   Marland Kitchen Hx MRSA infection 2009  . Hyperlipidemia   . Hypertension   . Neuromuscular disorder (HCC)    chronic pain  . Personal history of colonic polyps 10/17/2010   hyperplastic   Past Surgical History:  Procedure Laterality Date  . ABDOMINAL HYSTERECTOMY     unclear when  . CATARACT EXTRACTION Bilateral   . thigh  surg     left side due to MRSA   Social History:   reports that she has been smoking Cigarettes.  She has a 10.00 pack-year smoking history. She has never used smokeless tobacco. She reports that she drinks about 1.2 oz of alcohol per week . She reports that she does not use drugs.  Family History  Problem Relation Age of Onset  . Hypertension Father   . Kidney disease Father   . Diabetes Brother   . Colon cancer Neg Hx     . CAD Neg Hx     Medications: Patient's Medications  New Prescriptions   No medications on file  Previous Medications   ALPRAZOLAM (XANAX) 0.5 MG TABLET    TAKE 1 TABLET BY MOUTH TWICE DAILY IF NEEDED FOR ANXIETY   AMBULATORY NON FORMULARY MEDICATION    Trumetrix Test Strips Use to test blood sugar twice daily. Dx E11.49   AMLODIPINE (NORVASC) 10 MG TABLET    Take 1 tablet (10 mg total) by mouth daily.   ASPIRIN EC 325 MG EC TABLET    Take 1 tablet (325 mg total) by mouth daily.   ATENOLOL (TENORMIN) 25 MG TABLET    Take 1 tablet (25 mg total) by mouth daily.   BLOOD GLUCOSE MONITORING SUPPL (TRUE METRIX AIR GLUCOSE METER) W/DEVICE KIT    CHECK BLOOD SUGAR TWICE DAILY   CHOLECALCIFEROL (VITAMIN D3) 5000 UNITS TABS    Take 5,000 Units by mouth daily.    CLOPIDOGREL (PLAVIX) 75 MG TABLET    Take 1 tablet (75 mg total) by mouth daily.   CROMOLYN (OPTICROM) 4 % OPHTHALMIC SOLUTION    Place 1 drop into both eyes 2 (two) times daily.    DICLOFENAC SODIUM (VOLTAREN) 1 % GEL    Apply 4 g topically 4 (four) times daily as needed.   EZETIMIBE (ZETIA) 10 MG TABLET    Take 1 tablet (10 mg total) by mouth daily.   GLUCOSE BLOOD (TRUE METRIX BLOOD GLUCOSE TEST) TEST STRIP    CHECK BLOOD SUGAR TWICE DAILY   LATANOPROST (XALATAN) 0.005 % OPHTHALMIC SOLUTION    Place 1 drop into both eyes at bedtime. Reported on 08/27/2015   LISINOPRIL (PRINIVIL,ZESTRIL) 40 MG TABLET    Take 40 mg by mouth daily.   LORATADINE (CLARITIN) 10 MG TABLET    Take 10 mg by mouth daily as needed for allergies.    OMEPRAZOLE (PRILOSEC) 40 MG CAPSULE    take 1 capsule by mouth once daily   OVER THE COUNTER MEDICATION    Take by mouth See admin instructions. Energizing Iron: Take 2 softgels by mouth once a day   PAROXETINE (PAXIL) 40 MG TABLET    take 1 tablet by mouth once daily   PRAVASTATIN (PRAVACHOL) 40 MG TABLET    Take 40 mg by mouth at bedtime.    SUMATRIPTAN (IMITREX) 25 MG TABLET    1 tablet daily as needed for headache,  May repeat in 2 hours if headache persists or recurs. Max of 2 tablets in 24 hours   TRIAMCINOLONE CREAM (KENALOG) 0.1 %    Apply 1 application topically 3 (three) times daily as needed (rash).    TRUEPLUS LANCETS 33G MISC    CHECK BLOOD SUGAR TWICE DAILY   ZOLPIDEM (AMBIEN) 5 MG TABLET    Take 1 tablet (5 mg total) by mouth at bedtime as needed for sleep.  Modified Medications   No medications on file  Discontinued Medications  LINAGLIPTIN (TRADJENTA) 5 MG TABS TABLET    Take 1 tablet (5 mg total) by mouth daily.     Physical Exam:  Vitals:   12/06/16 1103  BP: (!) 148/86  Pulse: 74  Resp: 18  Temp: (!) 97.5 F (36.4 C)  TempSrc: Oral  SpO2: 98%  Weight: 173 lb 12.8 oz (78.8 kg)  Height: _0  (1.651 m)   Body mass index is 28.92 kg/m.  Physical Exam  Constitutional: She is oriented to person, place, and time. She appears well-developed and well-nourished. No distress.  HENT:  Head: Normocephalic and atraumatic.  Eyes: Pupils are equal, round, and reactive to light. Conjunctivae and EOM are normal.  Cardiovascular: Normal rate, regular rhythm and normal heart sounds.   No murmur heard. Pulmonary/Chest: Effort normal and breath sounds normal. No respiratory distress. She has no wheezes. She exhibits no tenderness.  Abdominal: Soft. Bowel sounds are normal.  Neurological: She is alert and oriented to person, place, and time.  Skin: Skin is warm and dry.  Psychiatric: Her affect is blunt.    Labs reviewed: Basic Metabolic Panel:  Recent Labs  01/06/16 1207  09/28/16 0903 12/02/16 0835 12/02/16 0916 12/03/16 0625  NA 138  < > 144 140 143 140  K 4.3  < > 4.6 4.6 4.6 4.7  CL 107  < > 115* 113* 113* 111  CO2 22  < > 18* 18*  --  21*  GLUCOSE 41*  < > 91 100* 89 95  BUN 23  < > 50* 58* 62* 58*  CREATININE 2.73*  < > 6.33* 8.42* 8.80* 7.99*  CALCIUM 8.8  < > 7.9* 8.8*  --  8.8*  PHOS  --   --   --  6.8*  --   --   TSH 1.41  --   --   --   --   --   < > = values  in this interval not displayed. Liver Function Tests:  Recent Labs  09/28/16 0903 12/02/16 0835 12/03/16 0625  AST 18 15 13*  ALT 18 14 13*  ALKPHOS 88 104 102  BILITOT 0.3 0.5 0.6  PROT 5.8* 6.2* 6.3*  ALBUMIN 3.1* 3.1* 3.1*   No results for input(s): LIPASE, AMYLASE in the last 8760 hours. No results for input(s): AMMONIA in the last 8760 hours. CBC:  Recent Labs  01/06/16 1207 07/14/16 0559 12/02/16 0835 12/02/16 0916  WBC 8.3 6.7 7.7  --   NEUTROABS 3,818  --  4.4  --   HGB 10.0* 10.6* 9.3* 8.8*  HCT 30.5* 32.1* 27.9* 26.0*  MCV 87.4 84.9 86.6  --   PLT 295 214 237  --    Lipid Panel:  Recent Labs  05/30/16 0910 09/28/16 0903 12/03/16 0625  CHOL 218* 161 252*  HDL 55 62 45  LDLCALC 130* 77 151*  TRIG 163* 110 281*  CHOLHDL 4.0 2.6 5.6   TSH:  Recent Labs  01/06/16 1207  TSH 1.41   A1C: Lab Results  Component Value Date   HGBA1C 5.2 12/03/2016     Assessment/Plan 1. Cerebrovascular accident (CVA), unspecified mechanism ([place]) Doing well since she has been out of the hospital, went over medication in detail and educated on need.  Encouraged smoking cessation.  - pravastatin (PRAVACHOL) 40 MG tablet; Take 1 tablet (40 mg total) by mouth at bedtime.  Dispense: 90 tablet; Refill: 0 - clopidogrel (PLAVIX) 75 MG tablet; Take 1 tablet (75 mg total) by mouth daily.  Dispense: 90 tablet; Refill: 0 - ezetimibe (ZETIA) 10 MG tablet; Take 1 tablet (10 mg total) by mouth daily.  Dispense: 90 tablet; Refill: 0 - Ambulatory referral to Neurology  2. 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 Will remove from medication list and pt to cont with diet modifications.   3. Mixed hyperlipidemia -to cont with pravastatin and zetia with dietary changes  4. Depression with anxiety situational worse due to mother recently passing  and all of her health issues however she feels like she is coping effectively and it will get better with time.   5. Tobacco abuse Smoking cessation encouraged  6. Stage 5 chronic kidney disease not on chronic dialysis [facility]) Following with nephrology, remains on transplant list, undecided about HD  Next appt:  As scheduled.  Carlos American. Harle Battiest  [facility] [phone] 8 am - 5 pm) [phone] (after hours)

## 2016-12-07 ENCOUNTER — Telehealth: Payer: Self-pay

## 2016-12-07 DIAGNOSIS — E1122 Type 2 diabetes mellitus with diabetic chronic kidney disease: Secondary | ICD-10-CM | POA: Diagnosis not present

## 2016-12-07 DIAGNOSIS — Z7982 Long term (current) use of aspirin: Secondary | ICD-10-CM | POA: Diagnosis not present

## 2016-12-07 DIAGNOSIS — I69398 Other sequelae of cerebral infarction: Secondary | ICD-10-CM | POA: Diagnosis not present

## 2016-12-07 DIAGNOSIS — N185 Chronic kidney disease, stage 5: Secondary | ICD-10-CM | POA: Diagnosis not present

## 2016-12-07 DIAGNOSIS — Z7902 Long term (current) use of antithrombotics/antiplatelets: Secondary | ICD-10-CM | POA: Diagnosis not present

## 2016-12-07 DIAGNOSIS — Z720 Tobacco use: Secondary | ICD-10-CM | POA: Diagnosis not present

## 2016-12-07 DIAGNOSIS — I120 Hypertensive chronic kidney disease with stage 5 chronic kidney disease or end stage renal disease: Secondary | ICD-10-CM | POA: Diagnosis not present

## 2016-12-07 DIAGNOSIS — R2681 Unsteadiness on feet: Secondary | ICD-10-CM | POA: Diagnosis not present

## 2016-12-07 NOTE — Telephone Encounter (Signed)
I have made the 2nd and final attempt to contact the patient or family member in charge, in order to follow up from recently being discharged from the hospital. Her VM box is not set up.

## 2016-12-07 NOTE — Telephone Encounter (Signed)
A call was received from [facility] requesting verbal orders for home health PT.   Verbal orders were given for the following: 1 week 1 and 2 weeks 4   For gait training, home exercise program and home safety.

## 2016-12-13 ENCOUNTER — Ambulatory Visit (INDEPENDENT_AMBULATORY_CARE_PROVIDER_SITE_OTHER): Payer: Medicare HMO | Admitting: Neurology

## 2016-12-13 ENCOUNTER — Encounter: Payer: Self-pay | Admitting: Neurology

## 2016-12-13 DIAGNOSIS — R413 Other amnesia: Secondary | ICD-10-CM | POA: Diagnosis not present

## 2016-12-13 DIAGNOSIS — R2681 Unsteadiness on feet: Secondary | ICD-10-CM | POA: Diagnosis not present

## 2016-12-13 DIAGNOSIS — Z7902 Long term (current) use of antithrombotics/antiplatelets: Secondary | ICD-10-CM | POA: Diagnosis not present

## 2016-12-13 DIAGNOSIS — F419 Anxiety disorder, unspecified: Secondary | ICD-10-CM

## 2016-12-13 DIAGNOSIS — I69398 Other sequelae of cerebral infarction: Secondary | ICD-10-CM | POA: Diagnosis not present

## 2016-12-13 DIAGNOSIS — I6389 Other cerebral infarction: Secondary | ICD-10-CM

## 2016-12-13 DIAGNOSIS — N185 Chronic kidney disease, stage 5: Secondary | ICD-10-CM | POA: Diagnosis not present

## 2016-12-13 DIAGNOSIS — I638 Other cerebral infarction: Secondary | ICD-10-CM

## 2016-12-13 DIAGNOSIS — E1122 Type 2 diabetes mellitus with diabetic chronic kidney disease: Secondary | ICD-10-CM | POA: Diagnosis not present

## 2016-12-13 DIAGNOSIS — Z720 Tobacco use: Secondary | ICD-10-CM | POA: Diagnosis not present

## 2016-12-13 DIAGNOSIS — Z7982 Long term (current) use of aspirin: Secondary | ICD-10-CM | POA: Diagnosis not present

## 2016-12-13 DIAGNOSIS — I120 Hypertensive chronic kidney disease with stage 5 chronic kidney disease or end stage renal disease: Secondary | ICD-10-CM | POA: Diagnosis not present

## 2016-12-13 NOTE — Progress Notes (Signed)
NEUROLOGY FOLLOW UP OFFICE NOTE  [name] [id number] 11-Oct-1945  HISTORY OF PRESENT ILLNESS: I had the pleasure of seeing [name] in follow-up in the neurology clinic on 12/13/2016.  The patient was last seen on 7 months ago for memory loss. MMSE in January 2018 was 29/30. She is alone in the office today. Records and images were personally reviewed where available. Since her last visit, she has been admitted for a stroke last 12/02/16. She woke up feeling dizzy, walked to her brother's house who noted that she was confused, speaking of events from yesterday as if they happened that day. She had some difficulty walking up stairs. He brought her to [facility] where she was noted to have wandering confused conversation. MRI brain showed three punctate areas of acute infarcts in the posterior left MCA territory involving the left frontal and parietal lobes. There were remote lacunar infarcts in the cerebellum and basal ganglia bilaterally. There was chronic microvascular disease. MRA showed a short segment of mild stenosis in the left proximal M2 superior division. Carotid dopplers no significant stenosis, echocardiogram showed EF of 60-65%, mildly dilated left atrium. Lipid panel showed an LDL of 151, HbA1c 5.2. She was discharged home on dual antiplatelet therapy with aspirin and Plavix, she is on pravastatin 45m daily. She reports doing well since hospital stay. She feels her memory is pretty good sometimes, and sometimes it is not. She forgets her medications sometimes, "I take so many." Her brother is in charge of finances. She does not drive. She has PT coming to her home and they tell her the right side is different, she does not notice any weakness herself. She denies any focal numbness/tingling. She keeps repeating "my nerves are bad" and that she is impatient, "things get on my nerves real easy." She feels she needs a higher dose of Xanax. She states "I'm crazy, or so they say." She reports  difficulties adjusting with both her parents passing away within a year, her mother passed away last M06/21/2024 She states it is taking her a long time to adjust, she finds herself staring into space, missing her mother. She reports that she knows she does not look at people when they talk. She has had a few falls walking her dog, last fall was in the winter.   HPI 05/23/2016: This is a 71yo RH woman with a history of hypertension, hyperlipidemia, diabetes, anxiety,who presented for evaluation of worsening memory. She feels she forgets things sometimes. She lives alone and reports missing a telephone bill last month due to a tendency to just throw a bill away. She occasionally forgets her medication or feels tired of taking them. She does not cook and does not drive. She states her brother aggravates her so she tends to be by herself more, he lives close by. She endorses finishing a bottle of wine or half a bottle of wine, and states "you are just talking to the wall when you tell me to stop drinking." When she is warned by her physicians about the drinking, she feels that they can find something wrong with her, "I know I'm crazy and I have bipolar." She has a short fuse and has not yet seen a psychiatrist. She states she does not have a whole lot of patience. She has headaches around 2-3 times a day, triggered by aggravation. Headaches start in the base of her neck or on the right side, no associated nausea/vomiting, vision changes. The pain can last all day.  She takes Atenolol for the migraines, which she feels helps sometimes. She has occasional dizziness. She denies any diplopia, dysarthria/dysphagia, neck pain, focal numbness/tingling/weakness. She has occasional low back pain and constipation. She states she "does a lot of falling." She denies any prior history of stroke. I personally reviewed MRI brain done 03/05/2016 which showed a punctate acute/subacute small vessel infarction within the right basal ganglia.  There is chronic small vessel disease in the brainstem, cerebellum, thalami, basal ganglia, and cerebral hemispheric white matter. There are old cortical and subcortical infarctions at the right parietal vertex and left posterior parietal region. Hemosiderin deposition seen in the region of old cortical infarctions  PAST MEDICAL HISTORY: Past Medical History:  Diagnosis Date  . Anxiety   . Arthritis    lower back and knees   . Asthma    childhood  . Barrett's esophagus   . Depression   . Diabetes mellitus   . Dysphagia   . Eczema   . History of herpes zoster 02/2010   Recovered fully after period of acute herpetic neuralgia tx w/ gabapentin.   Marland Kitchen Hx MRSA infection 2009  . Hyperlipidemia   . Hypertension   . Neuromuscular disorder (HCC)    chronic pain  . Personal history of colonic polyps 10/17/2010   hyperplastic    MEDICATIONS: Current Outpatient Prescriptions on File Prior to Visit  Medication Sig Dispense Refill  . ALPRAZolam (XANAX) 0.5 MG tablet TAKE 1 TABLET BY MOUTH TWICE DAILY IF NEEDED FOR ANXIETY 60 tablet 0  . AMBULATORY NON FORMULARY MEDICATION Trumetrix Test Strips Use to test blood sugar twice daily. Dx E11.49 200 each 3  . amLODipine (NORVASC) 10 MG tablet Take 1 tablet (10 mg total) by mouth daily. 30 tablet 0  . aspirin EC 325 MG EC tablet Take 1 tablet (325 mg total) by mouth daily. 30 tablet 0  . atenolol (TENORMIN) 25 MG tablet Take 1 tablet (25 mg total) by mouth daily. 30 tablet 0  . Blood Glucose Monitoring Suppl (TRUE METRIX AIR GLUCOSE METER) w/Device KIT CHECK BLOOD SUGAR TWICE DAILY 1 kit 0  . Cholecalciferol (VITAMIN D3) 5000 units TABS Take 5,000 Units by mouth daily.     . clopidogrel (PLAVIX) 75 MG tablet Take 1 tablet (75 mg total) by mouth daily. 90 tablet 0  . cromolyn (OPTICROM) 4 % ophthalmic solution Place 1 drop into both eyes 2 (two) times daily.   0  . diclofenac sodium (VOLTAREN) 1 % GEL Apply 4 g topically 4 (four) times daily as needed.  100 g 0  . ezetimibe (ZETIA) 10 MG tablet Take 1 tablet (10 mg total) by mouth daily. 90 tablet 0  . glucose blood (TRUE METRIX BLOOD GLUCOSE TEST) test strip CHECK BLOOD SUGAR TWICE DAILY 200 each 3  . latanoprost (XALATAN) 0.005 % ophthalmic solution Place 1 drop into both eyes at bedtime. Reported on 08/27/2015    . lisinopril (PRINIVIL,ZESTRIL) 40 MG tablet Take 40 mg by mouth daily.    Marland Kitchen loratadine (CLARITIN) 10 MG tablet Take 10 mg by mouth daily as needed for allergies.     Marland Kitchen OVER THE COUNTER MEDICATION Take by mouth See admin instructions. Energizing Iron: Take 2 softgels by mouth once a day    . pantoprazole (PROTONIX) 40 MG tablet Take 1 tablet (40 mg total) by mouth daily. 30 tablet 3  . PARoxetine (PAXIL) 40 MG tablet take 1 tablet by mouth once daily 30 tablet 6  . pravastatin (PRAVACHOL) 40 MG  tablet Take 1 tablet (40 mg total) by mouth at bedtime. 90 tablet 0  . SUMAtriptan (IMITREX) 25 MG tablet 1 tablet daily as needed for headache, May repeat in 2 hours if headache persists or recurs. Max of 2 tablets in 24 hours 40 tablet 0  . triamcinolone cream (KENALOG) 0.1 % Apply 1 application topically 3 (three) times daily as needed (rash).   0  . TRUEPLUS LANCETS 33G MISC CHECK BLOOD SUGAR TWICE DAILY 200 each 3  . zolpidem (AMBIEN) 5 MG tablet Take 1 tablet (5 mg total) by mouth at bedtime as needed for sleep. 30 tablet 1   No current facility-administered medications on file prior to visit.     ALLERGIES: Allergies  Allergen Reactions  . Lisinopril Other (See Comments)    Abnormal Kidney Function   . Morphine And Related Nausea And Vomiting  . Pioglitazone Other (See Comments)    REACTION: Desquamation of skin of the palm  . Sulfonamide Derivatives Rash    FAMILY HISTORY: Family History  Problem Relation Age of Onset  . Hypertension Father   . Kidney disease Father   . Diabetes Brother   . Colon cancer Neg Hx   . CAD Neg Hx     SOCIAL HISTORY: Social History    Social History  . Marital status: Divorced    Spouse name: N/A  . Number of children: 0  . Years of education: N/A   Occupational History  . Retired [employer]   Social History Main Topics  . Smoking status: Current Every Day Smoker    Packs/day: 0.25    Years: 40.00    Types: Cigarettes  . Smokeless tobacco: Never Used     Comment: smokes 1 pack of cigerettes a week- off and on  . Alcohol use 1.2 oz/week    2 Standard drinks or equivalent per week     Comment: wine and beer- occasionally   . Drug use: No  . Sexual activity: No   Other Topics Concern  . Not on file   Social History Narrative   Financial assistance approved for 100% discount at [employer] and has [facility] card per [employer]   02/10/2010      3 caffeine drinks daily     REVIEW OF SYSTEMS: Constitutional: No fevers, chills, or sweats, no generalized fatigue, change in appetite Eyes: No visual changes, double vision, eye pain Ear, nose and throat: No hearing loss, ear pain, nasal congestion, sore throat Cardiovascular: No chest pain, palpitations Respiratory:  No shortness of breath at rest or with exertion, wheezes GastrointestinaI: No nausea, vomiting, diarrhea, abdominal pain, fecal incontinence Genitourinary:  No dysuria, urinary retention or frequency Musculoskeletal:  No neck pain, back pain Integumentary: No rash, pruritus, skin lesions Neurological: as above Psychiatric: + depression, insomnia, anxiety Endocrine: No palpitations, fatigue, diaphoresis, mood swings, change in appetite, change in weight, increased thirst Hematologic/Lymphatic:  No anemia, purpura, petechiae. Allergic/Immunologic: no itchy/runny eyes, nasal congestion, recent allergic reactions, rashes  PHYSICAL EXAM: There were no vitals filed for this visit. General: No acute distress Head:  Normocephalic/atraumatic Neck: supple, no paraspinal tenderness, full range of motion Heart:  Regular rate and rhythm Lungs:  Clear to  auscultation bilaterally Back: No paraspinal tenderness Skin/Extremities: No rash, no edema Neurological Exam: alert and oriented to person, place, and time. No aphasia or dysarthria. Fund of knowledge is appropriate.  Recent and remote memory are intact.  Attention and concentration are normal.    Able to name objects and repeat  phrases. CDT 4/5 MMSE - Mini Mental State Exam 12/13/2016 05/23/2016 01/06/2016  Orientation to time 5 5 4   Orientation to Place 5 5 5   Registration 3 3 3   Attention/ Calculation 5 5 5   Recall 1 2 2   Language- name 2 objects 2 2 2   Language- repeat 1 1 1   Language- follow 3 step command 3 3 2   Language- read & follow direction 1 1 1   Write a sentence 1 1 1   Copy design 1 1 0  Total score 28 29 26    Cranial nerves: Pupils equal, round, reactive to light.  Extraocular movements intact with no nystagmus. Visual fields full. Facial sensation intact. No facial asymmetry. Tongue, uvula, palate midline.  Motor: Bulk and tone normal, muscle strength 5/5 throughout with no pronator drift.  Sensation to light touch, temperature and vibration intact.  No extinction to double simultaneous stimulation.  Deep tendon reflexes brisk +2 throughout, negative Hoffman sign, no ankle clonus, toes downgoing.  Finger to nose testing intact.  Gait slow and cautious, no ataxia, mild difficulty with tandem walk but able.  IMPRESSION: This is a 71 yo RH woman with a history of hypertension, hyperlipidemia, diabetes, anxiety, who initially presented with worsening memory. She presents today after hospitalization last 12/02/2016 for left MCA strokes from intracranial stenosis. She has had prior strokes due to small vessel disease. She reports poor compliance to medications, she forgets them and feels she is taking too many. She is agreeable to having home health come to help with medication management. Her MMSE today is normal, 28/30. On her prior visit, we had discussed how memory changes are most  likely due to underlying psychiatric issues. With recurrent strokes and microvascular disease, vascular cognitive impairment may also be playing a role. The importance of control of vascular risk factors and medication compliance were discussed with her today. We also discussed mood issues, she is reporting more anxiety and higher dose of Xanax, she is agreeable to referral to Psychiatry and therapy. She knows to go to the ER immediately for any change in symptoms and will follow-up in 1 year or earlier if needed.   Thank you for allowing me to participate in her care.  Please do not hesitate to call for any questions or concerns.  The duration of this appointment visit was 25 minutes of face-to-face time with the patient.  Greater than 50% of this time was spent in counseling, explanation of diagnosis, planning of further management, and coordination of care.   Ellouise Newer, M.D.   CC: [name], NP

## 2016-12-13 NOTE — Patient Instructions (Addendum)
1. Refer to [facility] nurse to help with medication management 2. Refer to Behavioral Medicine for Psychiatry and therapy for depression and anxiety  We have sent a referral to [facility] for psychiatry and therap.  Please call [phone] to schedule your first appointment.   3. Continue aspirin and Plavix daily, continue control of blood pressure, cholesterol, diabetes 4. Follow-up in 1 year, call for any changes

## 2016-12-15 DIAGNOSIS — E1122 Type 2 diabetes mellitus with diabetic chronic kidney disease: Secondary | ICD-10-CM | POA: Diagnosis not present

## 2016-12-15 DIAGNOSIS — I69398 Other sequelae of cerebral infarction: Secondary | ICD-10-CM | POA: Diagnosis not present

## 2016-12-15 DIAGNOSIS — R2681 Unsteadiness on feet: Secondary | ICD-10-CM | POA: Diagnosis not present

## 2016-12-15 DIAGNOSIS — Z7902 Long term (current) use of antithrombotics/antiplatelets: Secondary | ICD-10-CM | POA: Diagnosis not present

## 2016-12-15 DIAGNOSIS — Z7982 Long term (current) use of aspirin: Secondary | ICD-10-CM | POA: Diagnosis not present

## 2016-12-15 DIAGNOSIS — N185 Chronic kidney disease, stage 5: Secondary | ICD-10-CM | POA: Diagnosis not present

## 2016-12-15 DIAGNOSIS — I120 Hypertensive chronic kidney disease with stage 5 chronic kidney disease or end stage renal disease: Secondary | ICD-10-CM | POA: Diagnosis not present

## 2016-12-15 DIAGNOSIS — Z720 Tobacco use: Secondary | ICD-10-CM | POA: Diagnosis not present

## 2016-12-18 ENCOUNTER — Other Ambulatory Visit (HOSPITAL_COMMUNITY): Payer: Self-pay | Admitting: *Deleted

## 2016-12-19 ENCOUNTER — Encounter (HOSPITAL_COMMUNITY)
Admission: RE | Admit: 2016-12-19 | Discharge: 2016-12-19 | Disposition: A | Discharge status: Home / Self Care | Payer: Medicare HMO | Source: Ambulatory Visit | Attending: Nephrology | Admitting: Nephrology

## 2016-12-19 DIAGNOSIS — N185 Chronic kidney disease, stage 5: Secondary | ICD-10-CM | POA: Insufficient documentation

## 2016-12-19 DIAGNOSIS — D631 Anemia in chronic kidney disease: Secondary | ICD-10-CM | POA: Diagnosis not present

## 2016-12-19 LAB — POCT HEMOGLOBIN-HEMACUE: Hemoglobin: 8.9 g/dL — ABNORMAL LOW (ref 12.0–15.0)

## 2016-12-19 MED ORDER — DARBEPOETIN ALFA 60 MCG/0.3ML IJ SOSY
PREFILLED_SYRINGE | INTRAMUSCULAR | Status: AC
Start: 2016-12-19 — End: 2016-12-19
  Administered 2016-12-19: 60 ug via SUBCUTANEOUS
  Filled 2016-12-19: qty 0.3

## 2016-12-19 MED ORDER — DARBEPOETIN ALFA 60 MCG/0.3ML IJ SOSY
60.0000 ug | PREFILLED_SYRINGE | INTRAMUSCULAR | Status: DC
  Administered 2016-12-19: 60 ug via SUBCUTANEOUS

## 2016-12-19 NOTE — Discharge Instructions (Signed)

## 2016-12-20 DIAGNOSIS — Z720 Tobacco use: Secondary | ICD-10-CM | POA: Diagnosis not present

## 2016-12-20 DIAGNOSIS — N185 Chronic kidney disease, stage 5: Secondary | ICD-10-CM | POA: Diagnosis not present

## 2016-12-20 DIAGNOSIS — Z7982 Long term (current) use of aspirin: Secondary | ICD-10-CM | POA: Diagnosis not present

## 2016-12-20 DIAGNOSIS — Z7902 Long term (current) use of antithrombotics/antiplatelets: Secondary | ICD-10-CM | POA: Diagnosis not present

## 2016-12-20 DIAGNOSIS — I69398 Other sequelae of cerebral infarction: Secondary | ICD-10-CM | POA: Diagnosis not present

## 2016-12-20 DIAGNOSIS — E1122 Type 2 diabetes mellitus with diabetic chronic kidney disease: Secondary | ICD-10-CM | POA: Diagnosis not present

## 2016-12-20 DIAGNOSIS — I120 Hypertensive chronic kidney disease with stage 5 chronic kidney disease or end stage renal disease: Secondary | ICD-10-CM | POA: Diagnosis not present

## 2016-12-20 DIAGNOSIS — R2681 Unsteadiness on feet: Secondary | ICD-10-CM | POA: Diagnosis not present

## 2016-12-27 ENCOUNTER — Other Ambulatory Visit: Payer: Self-pay | Admitting: Internal Medicine

## 2016-12-27 ENCOUNTER — Telehealth: Payer: Self-pay | Admitting: *Deleted

## 2016-12-27 DIAGNOSIS — Z720 Tobacco use: Secondary | ICD-10-CM | POA: Diagnosis not present

## 2016-12-27 DIAGNOSIS — I10 Essential (primary) hypertension: Secondary | ICD-10-CM

## 2016-12-27 DIAGNOSIS — I69398 Other sequelae of cerebral infarction: Secondary | ICD-10-CM | POA: Diagnosis not present

## 2016-12-27 DIAGNOSIS — N185 Chronic kidney disease, stage 5: Secondary | ICD-10-CM | POA: Diagnosis not present

## 2016-12-27 DIAGNOSIS — Z7982 Long term (current) use of aspirin: Secondary | ICD-10-CM | POA: Diagnosis not present

## 2016-12-27 DIAGNOSIS — Z7902 Long term (current) use of antithrombotics/antiplatelets: Secondary | ICD-10-CM | POA: Diagnosis not present

## 2016-12-27 DIAGNOSIS — I120 Hypertensive chronic kidney disease with stage 5 chronic kidney disease or end stage renal disease: Secondary | ICD-10-CM | POA: Diagnosis not present

## 2016-12-27 DIAGNOSIS — E1122 Type 2 diabetes mellitus with diabetic chronic kidney disease: Secondary | ICD-10-CM | POA: Diagnosis not present

## 2016-12-27 DIAGNOSIS — R2681 Unsteadiness on feet: Secondary | ICD-10-CM | POA: Diagnosis not present

## 2016-12-27 MED ORDER — CLONIDINE HCL 0.2 MG PO TABS: 0.2000 mg | ORAL_TABLET | Freq: Every day | ORAL | 0 refills | Status: DC | PRN

## 2016-12-27 NOTE — Progress Notes (Signed)
I recommend she get clonidine 0.2mg  tablets (sent to pharmacy) and take one of those, then recheck bp in 1 hour. If still elevated, call back.  If new stroke symptoms or chest pain develop, she should proceed to the ED.   Kippy Gohman L. Brantley Naser, D.O. [facility] [address], [place] [address] Cell Phone (Mon-Fri 8am-5pm):  [phone] On Call:  [phone] & follow prompts after 5pm & weekends Office Phone:  [phone] Office Fax:  [phone]

## 2016-12-27 NOTE — Telephone Encounter (Signed)
Reed, Tiffany L, DO at 12/27/2016 11:29 AM   Status: Signed    I recommend she get clonidine 0.2mg  tablets (sent to pharmacy) and take one of those, then recheck bp in 1 hour. If still elevated, call back.  If new stroke symptoms or chest pain develop, she should proceed to the ED.   Tiffany L. Reed, D.O. [facility] [address], [place] [address] Cell Phone (Mon-Fri 8am-5pm):  [phone] On Call:  [phone] & follow prompts after 5pm & weekends Office Phone:  [phone] Office Fax:  [phone]       Tried calling patient times 3 and phone just rings.  I Called [place] Nurse, [facility] and she stated that she will let patient know and will be sure she understands directions.

## 2016-12-27 NOTE — Telephone Encounter (Signed)
Moneat with [facility] called and stated that patient's Blood Pressure is 198/100 and patient just got out of hospital from having a stroke. Stated that patient is taking blood pressure medications as directed. No stresses except for her Deep Rosaura Carpenter going out and having to replace it. Pulse 72, O2 96% and Temp 98.2. No available appointments for today. Please Advise.   Patient called c/o high blood pressure  1. What are your blood pressure readings? 198/100  2. When were the above readings checked? This morning   3 Any associated symptoms like headache, dizziness, chest pain, shortness of breath, weakness or numbness of leg/arm, or speech changes? no  4. Did you take your blood pressure medications (if patient on b/p medication, check medication list)? Yes  5. Did you take them at least a hour prior to checking your blood pressure? Yes  6. Have you been watching your salt in your diet? Yes  7. Have you eaten any salty foods like breakfast meats, ham, soups, canned foods, or snacks? States no, watches what she eats.   I will forward your response to your provider and call you with instructions, if your symptoms persist or progress seek medical attention at urgent care or the emergency room.

## 2016-12-27 NOTE — Telephone Encounter (Signed)
See note in chart to take clonidine.

## 2016-12-29 DIAGNOSIS — Z720 Tobacco use: Secondary | ICD-10-CM | POA: Diagnosis not present

## 2016-12-29 DIAGNOSIS — Z7982 Long term (current) use of aspirin: Secondary | ICD-10-CM | POA: Diagnosis not present

## 2016-12-29 DIAGNOSIS — N185 Chronic kidney disease, stage 5: Secondary | ICD-10-CM | POA: Diagnosis not present

## 2016-12-29 DIAGNOSIS — E1122 Type 2 diabetes mellitus with diabetic chronic kidney disease: Secondary | ICD-10-CM | POA: Diagnosis not present

## 2016-12-29 DIAGNOSIS — R2681 Unsteadiness on feet: Secondary | ICD-10-CM | POA: Diagnosis not present

## 2016-12-29 DIAGNOSIS — I69398 Other sequelae of cerebral infarction: Secondary | ICD-10-CM | POA: Diagnosis not present

## 2016-12-29 DIAGNOSIS — Z7902 Long term (current) use of antithrombotics/antiplatelets: Secondary | ICD-10-CM | POA: Diagnosis not present

## 2016-12-29 DIAGNOSIS — I120 Hypertensive chronic kidney disease with stage 5 chronic kidney disease or end stage renal disease: Secondary | ICD-10-CM | POA: Diagnosis not present

## 2017-01-01 ENCOUNTER — Encounter (HOSPITAL_COMMUNITY): Payer: Self-pay | Admitting: *Deleted

## 2017-01-01 ENCOUNTER — Emergency Department (HOSPITAL_COMMUNITY)
Admission: EM | Admit: 2017-01-01 | Discharge: 2017-01-01 | Disposition: A | Discharge status: Home / Self Care | Payer: Medicare HMO | Attending: Emergency Medicine | Admitting: Emergency Medicine

## 2017-01-01 ENCOUNTER — Emergency Department (HOSPITAL_COMMUNITY): Payer: Medicare HMO

## 2017-01-01 DIAGNOSIS — J45909 Unspecified asthma, uncomplicated: Secondary | ICD-10-CM | POA: Insufficient documentation

## 2017-01-01 DIAGNOSIS — F17210 Nicotine dependence, cigarettes, uncomplicated: Secondary | ICD-10-CM | POA: Diagnosis not present

## 2017-01-01 DIAGNOSIS — E1149 Type 2 diabetes mellitus with other diabetic neurological complication: Secondary | ICD-10-CM | POA: Diagnosis not present

## 2017-01-01 DIAGNOSIS — Z7902 Long term (current) use of antithrombotics/antiplatelets: Secondary | ICD-10-CM | POA: Diagnosis not present

## 2017-01-01 DIAGNOSIS — I10 Essential (primary) hypertension: Secondary | ICD-10-CM

## 2017-01-01 DIAGNOSIS — Z79899 Other long term (current) drug therapy: Secondary | ICD-10-CM | POA: Diagnosis not present

## 2017-01-01 DIAGNOSIS — N185 Chronic kidney disease, stage 5: Secondary | ICD-10-CM | POA: Insufficient documentation

## 2017-01-01 DIAGNOSIS — Z7982 Long term (current) use of aspirin: Secondary | ICD-10-CM | POA: Diagnosis not present

## 2017-01-01 DIAGNOSIS — I120 Hypertensive chronic kidney disease with stage 5 chronic kidney disease or end stage renal disease: Secondary | ICD-10-CM | POA: Diagnosis not present

## 2017-01-01 DIAGNOSIS — R51 Headache: Secondary | ICD-10-CM | POA: Diagnosis not present

## 2017-01-01 NOTE — ED Notes (Signed)
Patient transported to CT 

## 2017-01-01 NOTE — ED Notes (Signed)
Got patient into a gown on the monitor patient is resting with call bell in reach 

## 2017-01-01 NOTE — ED Provider Notes (Signed)
[facility] DEPT Provider Note   CSN: [id number] Arrival date & time: 01/01/17  0845     History   Chief Complaint Chief Complaint  Patient presents with  . Hypertension    HPI [name] is a 71 y.o. female.  71yo F w/ PMH including CVA, HTN, T2DM, GERD who p/w hypertension. The patient was hospitalized earlier in August for a stroke. She was discharged on medications and recently was set up with home health to assist with her medications. She has had trouble because she takes so many medications and reports that her blood pressure has been elevated for the past several days. Her PCP called in clonidine on 8/5 and she has been taking this medication once daily; it has helped some but her BP always rises again. She took clonidine this morning. She reports mild headache that is intermittent. She denies any associated vision changes, focal weakness, speech problems, chest pain, or breathing problems.   The history is provided by the patient.  Hypertension     Past Medical History:  Diagnosis Date  . Anxiety   . Arthritis    lower back and knees   . Asthma    childhood  . Barrett's esophagus   . Depression   . Diabetes mellitus   . Dysphagia   . Eczema   . History of herpes zoster 02/2010   Recovered fully after period of acute herpetic neuralgia tx w/ gabapentin.   Marland Kitchen Hx MRSA infection 2009  . Hyperlipidemia   . Hypertension   . Neuromuscular disorder (HCC)    chronic pain  . Personal history of colonic polyps 10/17/2010   hyperplastic    Patient Active Problem List   Diagnosis Date Noted  . CKD (chronic kidney disease) stage 5, GFR less than 15 ml/min (HCC) 12/03/2016  . CVA (cerebral vascular accident) ([place]) 12/03/2016  . Stroke (cerebrum) ([place]) 12/02/2016  . Memory changes 05/29/2016  . Stroke syndrome ([place]) 05/29/2016  . Mild single current episode of major depressive disorder ([place]) 05/29/2016  . Osteoarthritis of right knee 02/14/2016  . Current  smoker 01/06/2016  . Headache 06/24/2015  . Migraine 06/24/2015  . Chronic knee pain 10/13/2014  . Eczema   . Arthritis   . Anxiety   . Iron deficiency anemia due to chronic blood loss 09/05/2013  . Microalbuminuria 09/05/2013  . Toe pain, left 08/05/2013  . Lipoma of back 07/31/2013  . Tobacco abuse 10/18/2012  . Dermatitis 04/30/2012  . Barrett esophagus 11/02/2010  . GERD (gastroesophageal reflux disease) with acute nausea  09/29/2010  . DM (diabetes mellitus) type II controlled, neurological manifestation ([place]) 09/29/2010  . HERPES ZOSTER 03/25/2010  . Hyperlipidemia 10/30/2008  . Depression 07/16/2008  . INSOMNIA UNSPECIFIED 12/19/2007  . Essential hypertension 11/04/2007    Past Surgical History:  Procedure Laterality Date  . ABDOMINAL HYSTERECTOMY     unclear when  . CATARACT EXTRACTION Bilateral   . thigh surg     left side due to MRSA    OB History    No data available       Home Medications    Prior to Admission medications   Medication Sig Start Date End Date Taking? Authorizing Provider  ALPRAZolam Duanne Moron) 0.5 MG tablet TAKE 1 TABLET BY MOUTH TWICE DAILY IF NEEDED FOR ANXIETY 12/04/16   [name], NP  AMBULATORY NON FORMULARY MEDICATION Trum[employer] Test Strips Use to test blood sugar twice daily. Dx E11.49 01/26/15   [name], NP  amLODipine (  NORVASC) 10 MG tablet Take 1 tablet (10 mg total) by mouth daily. 09/29/16   [name], NP  aspirin EC 325 MG EC tablet Take 1 tablet (325 mg total) by mouth daily. 12/04/16   Debbe Odea, MD  atenolol (TENORMIN) 25 MG tablet Take 1 tablet (25 mg total) by mouth daily. 09/29/16   [name], NP  Blood Glucose Monitoring Suppl (TRUE METRIX AIR GLUCOSE METER) w/Device KIT CHECK BLOOD SUGAR TWICE DAILY 03/03/16   [name], NP  Cholecalciferol (VITAMIN D3) 5000 units TABS Take 5,000 Units by mouth daily.     [provider]  cloNIDine (CATAPRES) 0.2 MG tablet Take 1 tablet  (0.2 mg total) by mouth daily as needed. For bp over 170/90 12/27/16   Reed, Tiffany L, DO  clopidogrel (PLAVIX) 75 MG tablet Take 1 tablet (75 mg total) by mouth daily. 12/06/16   [name], NP  cromolyn (OPTICROM) 4 % ophthalmic solution Place 1 drop into both eyes 2 (two) times daily.  10/16/16   [provider]  diclofenac sodium (VOLTAREN) 1 % GEL Apply 4 g topically 4 (four) times daily as needed. 06/03/15   [name], NP  ezetimibe (ZETIA) 10 MG tablet Take 1 tablet (10 mg total) by mouth daily. 12/06/16 12/06/17  [name], NP  glucose blood (TRUE METRIX BLOOD GLUCOSE TEST) test strip CHECK BLOOD SUGAR TWICE DAILY 03/22/16   [name], NP  latanoprost (XALATAN) 0.005 % ophthalmic solution Place 1 drop into both eyes at bedtime. Reported on 08/27/2015 04/23/12   [provider]  lisinopril (PRINIVIL,ZESTRIL) 40 MG tablet Take 40 mg by mouth daily.    [provider]  loratadine (CLARITIN) 10 MG tablet Take 10 mg by mouth daily as needed for allergies.  06/11/12   Rosalia Hammers, MD  OVER THE COUNTER MEDICATION Take by mouth See admin instructions. Energizing Iron: Take 2 softgels by mouth once a day    [provider]  pantoprazole (PROTONIX) 40 MG tablet Take 1 tablet (40 mg total) by mouth daily. 12/06/16   [name], NP  PARoxetine (PAXIL) 40 MG tablet take 1 tablet by mouth once daily 10/30/16   [name], NP  pravastatin (PRAVACHOL) 40 MG tablet Take 1 tablet (40 mg total) by mouth at bedtime. 12/06/16   [name], NP  SUMAtriptan (IMITREX) 25 MG tablet 1 tablet daily as needed for headache, May repeat in 2 hours if headache persists or recurs. Max of 2 tablets in 24 hours 07/09/15   Gildardo Cranker, DO  triamcinolone cream (KENALOG) 0.1 % Apply 1 application topically 3 (three) times daily as needed (rash).  04/14/15   [provider]  TRUEPLUS LANCETS 33G MISC CHECK BLOOD SUGAR TWICE DAILY  03/22/16   [name], NP  zolpidem (AMBIEN) 5 MG tablet Take 1 tablet (5 mg total) by mouth at bedtime as needed for sleep. 09/28/16   [name], NP    Family History Family History  Problem Relation Age of Onset  . Hypertension Father   . Kidney disease Father   . Diabetes Brother   . Colon cancer Neg Hx   . CAD Neg Hx     Social History Social History  Substance Use Topics  . Smoking status: Current Every Day Smoker    Packs/day: 0.25    Years: 40.00    Types: Cigarettes  . Smokeless tobacco: Never Used     Comment:  smokes 1 pack of cigerettes a week- off and on  . Alcohol use 1.2 oz/week    2 Standard drinks or equivalent per week     Comment: wine and beer- occasionally      Allergies   Lisinopril; Morphine and related; Pioglitazone; and Sulfonamide derivatives   Review of Systems Review of Systems All other systems reviewed and are negative except that which was mentioned in HPI   Physical Exam Updated Vital Signs BP (!) 181/86   Pulse (!) 49   Temp 97.6 F (36.4 C)   Resp 20   Ht _0  (1.651 m)   Wt 78.5 kg (173 lb)   SpO2 100%   BMI 28.79 kg/m   Physical Exam  Constitutional: She is oriented to person, place, and time. She appears well-developed and well-nourished. No distress.  Awake, alert  HENT:  Head: Normocephalic and atraumatic.  Eyes: Pupils are equal, round, and reactive to light. Conjunctivae and EOM are normal.  Neck: Neck supple.  Cardiovascular: Normal rate, regular rhythm and normal heart sounds.   No murmur heard. Pulmonary/Chest: Effort normal and breath sounds normal. No respiratory distress.  Abdominal: Soft. Bowel sounds are normal. She exhibits no distension. There is no tenderness.  Musculoskeletal: She exhibits no edema.  Neurological: She is alert and oriented to person, place, and time. She has normal reflexes. No cranial nerve deficit. She exhibits normal muscle tone.  Fluent speech, normal finger-to-nose  testing, negative pronator drift, no clonus 5/5 strength and normal sensation x all 4 extremities  Skin: Skin is warm and dry.  Psychiatric: She has a normal mood and affect. Thought content normal.  Nursing note and vitals reviewed.    ED Treatments / Results  Labs (all labs ordered are listed, but only abnormal results are displayed) Labs Reviewed - No data to display  EKG  EKG Interpretation None       Radiology No results found.  Procedures Procedures (including critical care time)  Medications Ordered in ED Medications - No data to display   Initial Impression / Assessment and Plan / ED Course  I have reviewed the triage vital signs and the nursing notes.  Pertinent imaging results that were available during my care of the patient were reviewed by me and considered in my medical decision making (see chart for details).     PT w/ persistent hypertension, PCP called in clonidine several days ago but She has continued to have problems with high blood pressure. She was comfortable on exam, vital signs notable for hypertension, 696E to 952W systolic over 41L to 24M diastolic. She had a reassuring neurologic exam and denied any severe headache or neurologic changes to suggest bleed. I did obtain a head CT as she has had some memory problems recently and is not a good historian. Head CT negative acute. She pulled out her weekly pill bottles and all of her pills are in disarray, with some medications such as omeprazole repeated multiple times in the slot for the same day. I therefore did not let her take any of these medications here as it was unclear which medications she was supposed to take out what time. I have strongly emphasized the importance of allowing her nurse to sort out these medications for her and to follow up as soon as possible with her primary care doctor for ongoing discussion of blood pressure management. Extensively reviewed return precautions with her including  sudden onset of severe headache, new neurologic symptoms, chest pain, or  shortness of breath. Patient voiced understanding of plan and was discharged in satisfactory condition.  Final Clinical Impressions(s) / ED Diagnoses   Final diagnoses:  None    New Prescriptions New Prescriptions   No medications on file     Deshawna Mcneece, Wenda Overland, MD 01/01/17 1215

## 2017-01-01 NOTE — Discharge Instructions (Signed)
IT IS VERY IMPORTANT FOR YOU TO HAVE YOUR NURSE AIDE SORT OUT YOUR MEDICATIONS TO MAKE SURE YOU HAVE THEM PLANNED CORRECTLY FOR THE WEEK. CONTACT YOUR PRIMARY CARE PROVIDER TO FOLLOW UP AS SOON AS POSSIBLE.

## 2017-01-01 NOTE — ED Triage Notes (Signed)
Pt states bp of 171/105 x 3 days.  States headache. Pressure 149/94.

## 2017-01-02 ENCOUNTER — Ambulatory Visit (INDEPENDENT_AMBULATORY_CARE_PROVIDER_SITE_OTHER): Payer: Medicare HMO | Admitting: Nurse Practitioner

## 2017-01-02 ENCOUNTER — Encounter: Payer: Self-pay | Admitting: Nurse Practitioner

## 2017-01-02 VITALS — BP 188/100 | HR 98 | Temp 98.1°F | Resp 17 | Ht 65.0 in | Wt 177.0 lb

## 2017-01-02 DIAGNOSIS — Z7902 Long term (current) use of antithrombotics/antiplatelets: Secondary | ICD-10-CM | POA: Diagnosis not present

## 2017-01-02 DIAGNOSIS — G43009 Migraine without aura, not intractable, without status migrainosus: Secondary | ICD-10-CM

## 2017-01-02 DIAGNOSIS — I639 Cerebral infarction, unspecified: Secondary | ICD-10-CM | POA: Diagnosis not present

## 2017-01-02 DIAGNOSIS — E782 Mixed hyperlipidemia: Secondary | ICD-10-CM

## 2017-01-02 DIAGNOSIS — Z794 Long term (current) use of insulin: Secondary | ICD-10-CM | POA: Diagnosis not present

## 2017-01-02 DIAGNOSIS — I120 Hypertensive chronic kidney disease with stage 5 chronic kidney disease or end stage renal disease: Secondary | ICD-10-CM | POA: Diagnosis not present

## 2017-01-02 DIAGNOSIS — F418 Other specified anxiety disorders: Secondary | ICD-10-CM

## 2017-01-02 DIAGNOSIS — N185 Chronic kidney disease, stage 5: Secondary | ICD-10-CM

## 2017-01-02 DIAGNOSIS — I10 Essential (primary) hypertension: Secondary | ICD-10-CM | POA: Diagnosis not present

## 2017-01-02 DIAGNOSIS — Z7982 Long term (current) use of aspirin: Secondary | ICD-10-CM | POA: Diagnosis not present

## 2017-01-02 DIAGNOSIS — E1149 Type 2 diabetes mellitus with other diabetic neurological complication: Secondary | ICD-10-CM | POA: Diagnosis not present

## 2017-01-02 DIAGNOSIS — E559 Vitamin D deficiency, unspecified: Secondary | ICD-10-CM

## 2017-01-02 DIAGNOSIS — I69398 Other sequelae of cerebral infarction: Secondary | ICD-10-CM | POA: Diagnosis not present

## 2017-01-02 DIAGNOSIS — G4700 Insomnia, unspecified: Secondary | ICD-10-CM

## 2017-01-02 DIAGNOSIS — E1122 Type 2 diabetes mellitus with diabetic chronic kidney disease: Secondary | ICD-10-CM | POA: Diagnosis not present

## 2017-01-02 DIAGNOSIS — R2681 Unsteadiness on feet: Secondary | ICD-10-CM | POA: Diagnosis not present

## 2017-01-02 DIAGNOSIS — Z720 Tobacco use: Secondary | ICD-10-CM | POA: Diagnosis not present

## 2017-01-02 MED ORDER — ASPIRIN EC 325 MG PO TBEC: 325.0000 mg | DELAYED_RELEASE_TABLET | Freq: Every day | ORAL | 6 refills | Status: DC

## 2017-01-02 MED ORDER — CLOPIDOGREL BISULFATE 75 MG PO TABS: 75.0000 mg | ORAL_TABLET | Freq: Every day | ORAL | 6 refills | Status: DC

## 2017-01-02 MED ORDER — ALPRAZOLAM 0.5 MG PO TABS: ORAL_TABLET | ORAL | 2 refills | Status: DC

## 2017-01-02 MED ORDER — VITAMIN D3 125 MCG (5000 UT) PO TABS: 5000.0000 [IU] | ORAL_TABLET | Freq: Every day | ORAL | 6 refills | Status: DC

## 2017-01-02 MED ORDER — EZETIMIBE 10 MG PO TABS: 10.0000 mg | ORAL_TABLET | Freq: Every day | ORAL | 6 refills | Status: DC

## 2017-01-02 MED ORDER — SUMATRIPTAN SUCCINATE 25 MG PO TABS: ORAL_TABLET | ORAL | 0 refills | Status: DC

## 2017-01-02 MED ORDER — MELATONIN 3 MG PO TABS: ORAL_TABLET | ORAL | 0 refills | Status: DC

## 2017-01-02 MED ORDER — CLONIDINE HCL 0.2 MG PO TABS: 0.2000 mg | ORAL_TABLET | Freq: Every day | ORAL | 3 refills | Status: DC

## 2017-01-02 MED ORDER — PAROXETINE HCL 40 MG PO TABS: 40.0000 mg | ORAL_TABLET | Freq: Every day | ORAL | 6 refills | Status: DC

## 2017-01-02 MED ORDER — ASPIRIN EC 325 MG PO TBEC: 325.0000 mg | DELAYED_RELEASE_TABLET | Freq: Every day | ORAL | 0 refills | Status: DC

## 2017-01-02 MED ORDER — ATENOLOL 25 MG PO TABS: 25.0000 mg | ORAL_TABLET | Freq: Every day | ORAL | 6 refills | Status: DC

## 2017-01-02 MED ORDER — CLONIDINE HCL 0.2 MG PO TABS: 0.2000 mg | ORAL_TABLET | Freq: Two times a day (BID) | ORAL | 6 refills | Status: DC

## 2017-01-02 MED ORDER — CLONIDINE HCL 0.2 MG PO TABS: 0.2000 mg | ORAL_TABLET | Freq: Two times a day (BID) | ORAL | 3 refills | Status: DC

## 2017-01-02 MED ORDER — PRAVASTATIN SODIUM 40 MG PO TABS: 40.0000 mg | ORAL_TABLET | Freq: Every day | ORAL | 6 refills | Status: DC

## 2017-01-02 MED ORDER — AMLODIPINE BESYLATE 10 MG PO TABS: 10.0000 mg | ORAL_TABLET | Freq: Every day | ORAL | 6 refills | Status: DC

## 2017-01-02 NOTE — Patient Instructions (Addendum)
There has been significant changes to your medication list! Please review and take medication as prescribed.   NO benadryl Will stop Ambien at this time May use Over the counter melatonin 6 mg by mouth at bedtime for sleep.   Will start the clonidine 0.2 mg by mouth twice daily for blood pressure.

## 2017-01-02 NOTE — Progress Notes (Signed)
Careteam: Patient Care Team: [name], NP as PCP - General (Nurse Practitioner) Woodroe Mode (Inactive) as Diabetes Educator [name], MD as Consulting Physician (Ophthalmology) [name], MD as Consulting Physician (Nephrology)  Advanced Directive information Does Patient Have a Medical Advance Directive?: No  Allergies  Allergen Reactions  . Lisinopril Other (See Comments)    Abnormal Kidney Function   . Morphine And Related Nausea And Vomiting  . Pioglitazone Other (See Comments)    REACTION: Desquamation of skin of the palm  . Sulfonamide Derivatives Rash    Chief Complaint  Patient presents with  . Acute Visit    Pt is being seen for elevated blood pressure. Pt reports having some confusion about her medications. She states that she is on too many medictions and cannot keep them straight. Pt was seen at ED yesterday for blood pressure.      HPI: Patient is a 71 y.o. female seen in the office today due to persist high blood pressure.  Went to ED yesterday. Due to hypertension.  States that she has been taking Norvasc and atenolol which she has taken out of the bottle. She states she took these this morning.  Gets very confused over her medication. Says she has too many medication and does not want to take them.  Went through medications, she has 2 pill boxes full of medication. Unsure what is in them.  Went through with pill identifier app- taking phentermine which is not on her medication list.  Also does not remember that anyone prescribed her this.  Has 2 pharmacies that she is getting medications prescribed through.  Has omeprazole in pill organizer multiple times a day.  Taking iron 4 times daily but on medication list it is BID.  NOT taking tradjenta, has not taken anything for diabetes in over 3 months. Last A1c taken after she was off medication Nephrologist told her she was anemic so she started taking iron but because it was  recommended.  No acid reflux, not taking her omeprazole even tho it is in all of her pill box at every slot.  States she does not use Voltaren gel.  Not using Claritin, allergies have no really been an issue- then states she uses benadryl but not used in the last 2 weeks.   Having a migraine headache every day. Will take ASA for this. Not using imitrex  154/84 Review of Systems:  Review of Systems  Constitutional: Negative for chills, fever and weight loss.  HENT: Negative for tinnitus.   Respiratory: Negative for cough, sputum production and shortness of breath.   Cardiovascular: Negative for chest pain, palpitations and leg swelling.  Gastrointestinal: Negative for abdominal pain, constipation, diarrhea and heartburn.  Genitourinary: Negative for dysuria, frequency and urgency.  Musculoskeletal: Negative for back pain, falls, joint pain and myalgias.  Skin: Negative.   Neurological: Negative for dizziness and headaches.  Psychiatric/Behavioral: Positive for depression. Negative for suicidal ideas. The patient is nervous/anxious and has insomnia.     Past Medical History:  Diagnosis Date  . Anxiety   . Arthritis    lower back and knees   . Asthma    childhood  . Barrett's esophagus   . Depression   . Diabetes mellitus   . Dysphagia   . Eczema   . History of herpes zoster 02/2010   Recovered fully after period of acute herpetic neuralgia tx w/ gabapentin.   Marland Kitchen Hx MRSA infection 2009  . Hyperlipidemia   .  Hypertension   . Neuromuscular disorder (HCC)    chronic pain  . Personal history of colonic polyps 10/17/2010   hyperplastic   Past Surgical History:  Procedure Laterality Date  . ABDOMINAL HYSTERECTOMY     unclear when  . CATARACT EXTRACTION Bilateral   . thigh surg     left side due to MRSA   Social History:   reports that she has been smoking Cigarettes.  She has a 10.00 pack-year smoking history. She has never used smokeless tobacco. She reports that she drinks  about 1.2 oz of alcohol per week . She reports that she does not use drugs.  Family History  Problem Relation Age of Onset  . Hypertension Father   . Kidney disease Father   . Diabetes Brother   . Colon cancer Neg Hx   . CAD Neg Hx     Medications: Patient's Medications  New Prescriptions   No medications on file  Previous Medications   ALPRAZOLAM (XANAX) 0.5 MG TABLET    TAKE 1 TABLET BY MOUTH TWICE DAILY IF NEEDED FOR ANXIETY   AMBULATORY NON FORMULARY MEDICATION    [employer] Test Strips Use to test blood sugar twice daily. Dx E11.49   AMLODIPINE (NORVASC) 10 MG TABLET    Take 1 tablet (10 mg total) by mouth daily.   ASPIRIN EC 81 MG TABLET    Take 81 mg by mouth daily.   ATENOLOL (TENORMIN) 25 MG TABLET    Take 1 tablet (25 mg total) by mouth daily.   BLOOD GLUCOSE MONITORING SUPPL (TRUE METRIX AIR GLUCOSE METER) W/DEVICE KIT    CHECK BLOOD SUGAR TWICE DAILY   CHOLECALCIFEROL (VITAMIN D3) 5000 UNITS TABS    Take 5,000 Units by mouth daily.    CLONIDINE (CATAPRES) 0.2 MG TABLET    Take 1 tablet (0.2 mg total) by mouth daily as needed. For bp over 170/90   CLOPIDOGREL (PLAVIX) 75 MG TABLET    Take 1 tablet (75 mg total) by mouth daily.   CROMOLYN (OPTICROM) 4 % OPHTHALMIC SOLUTION    Place 1 drop into both eyes 2 (two) times daily.    DICLOFENAC SODIUM (VOLTAREN) 1 % GEL    Apply 4 g topically 4 (four) times daily as needed.   EZETIMIBE (ZETIA) 10 MG TABLET    Take 1 tablet (10 mg total) by mouth daily.   GLUCOSE BLOOD (TRUE METRIX BLOOD GLUCOSE TEST) TEST STRIP    CHECK BLOOD SUGAR TWICE DAILY   LATANOPROST (XALATAN) 0.005 % OPHTHALMIC SOLUTION    Place 1 drop into both eyes at bedtime. Reported on 08/27/2015   LISINOPRIL (PRINIVIL,ZESTRIL) 40 MG TABLET    Take 40 mg by mouth daily.   LORATADINE (CLARITIN) 10 MG TABLET    Take 10 mg by mouth daily as needed for allergies.    OMEPRAZOLE (PRILOSEC) 40 MG CAPSULE    Take 40 mg by mouth daily.   OVER THE COUNTER MEDICATION    Take by  mouth See admin instructions. Energizing Iron: Take 2 softgels by mouth once a day   PANTOPRAZOLE (PROTONIX) 40 MG TABLET    Take 1 tablet (40 mg total) by mouth daily.   PAROXETINE (PAXIL) 40 MG TABLET    take 1 tablet by mouth once daily   PRAVASTATIN (PRAVACHOL) 40 MG TABLET    Take 1 tablet (40 mg total) by mouth at bedtime.   SUMATRIPTAN (IMITREX) 25 MG TABLET    1 tablet daily as needed for headache,  May repeat in 2 hours if headache persists or recurs. Max of 2 tablets in 24 hours   TRIAMCINOLONE CREAM (KENALOG) 0.1 %    Apply 1 application topically 3 (three) times daily as needed (rash).    TRUEPLUS LANCETS 33G MISC    CHECK BLOOD SUGAR TWICE DAILY   ZOLPIDEM (AMBIEN) 5 MG TABLET    Take 1 tablet (5 mg total) by mouth at bedtime as needed for sleep.  Modified Medications   No medications on file  Discontinued Medications   ASPIRIN EC 325 MG EC TABLET    Take 1 tablet (325 mg total) by mouth daily.     Physical Exam:  Vitals:   01/02/17 0849  BP: (!) 188/100  Pulse: 98  Resp: 17  Temp: 98.1 F (36.7 C)  TempSrc: Oral  SpO2: 96%  Weight: 177 lb (80.3 kg)  Height: 5' 5"  (1.651 m)   Body mass index is 29.45 kg/m.  Physical Exam  Constitutional: She is oriented to person, place, and time. She appears well-developed and well-nourished. No distress.  HENT:  Head: Normocephalic and atraumatic.  Eyes: Pupils are equal, round, and reactive to light. Conjunctivae and EOM are normal.  Cardiovascular: Normal rate, regular rhythm and normal heart sounds.   No murmur heard. Pulmonary/Chest: Effort normal and breath sounds normal. No respiratory distress. She has no wheezes.  Abdominal: Soft. Bowel sounds are normal.  Musculoskeletal: She exhibits no edema.  Neurological: She is alert and oriented to person, place, and time.  Skin: Skin is warm and dry.  Psychiatric: Her behavior is normal. Judgment and thought content normal. Her affect is blunt.    Labs reviewed: Basic  Metabolic Panel:  Recent Labs  01/06/16 1207  09/28/16 0903 12/02/16 0835 12/02/16 0916 12/03/16 0625  NA 138  < > 144 140 143 140  K 4.3  < > 4.6 4.6 4.6 4.7  CL 107  < > 115* 113* 113* 111  CO2 22  < > 18* 18*  --  21*  GLUCOSE 41*  < > 91 100* 89 95  BUN 23  < > 50* 58* 62* 58*  CREATININE 2.73*  < > 6.33* 8.42* 8.80* 7.99*  CALCIUM 8.8  < > 7.9* 8.8*  --  8.8*  PHOS  --   --   --  6.8*  --   --   TSH 1.41  --   --   --   --   --   < > = values in this interval not displayed. Liver Function Tests:  Recent Labs  09/28/16 0903 12/02/16 0835 12/03/16 0625  AST 18 15 13*  ALT 18 14 13*  ALKPHOS 88 104 102  BILITOT 0.3 0.5 0.6  PROT 5.8* 6.2* 6.3*  ALBUMIN 3.1* 3.1* 3.1*   No results for input(s): LIPASE, AMYLASE in the last 8760 hours. No results for input(s): AMMONIA in the last 8760 hours. CBC:  Recent Labs  01/06/16 1207 07/14/16 0559 12/02/16 0835 12/02/16 0916 12/19/16 1221  WBC 8.3 6.7 7.7  --   --   NEUTROABS 3,818  --  4.4  --   --   HGB 10.0* 10.6* 9.3* 8.8* 8.9*  HCT 30.5* 32.1* 27.9* 26.0*  --   MCV 87.4 84.9 86.6  --   --   PLT 295 214 237  --   --    Lipid Panel:  Recent Labs  05/30/16 0910 09/28/16 0903 12/03/16 0625  CHOL 218* 161 252*  HDL 55  62 45  LDLCALC 130* 77 151*  TRIG 163* 110 281*  CHOLHDL 4.0 2.6 5.6   TSH:  Recent Labs  01/06/16 1207  TSH 1.41   A1C: Lab Results  Component Value Date   HGBA1C 5.2 12/03/2016     Assessment/Plan 1. Cerebrovascular accident (CVA), unspecified mechanism ([place]) -encouraged compliance with ASA 325 mg (taking 81 mg) and plavix. -took doses in office today.   2. 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 Will dc test strips, lancets etc off medication list to help "clean it up" she is not taking her blood sugars at this time.   3.  Mixed hyperlipidemia LDL elevated on last check, suspect she has not been taking her pravastatin or  Ezetimibe as prescribed, educated on importance of this esp with her hx of CVA  4. Depression with anxiety Encouraged to see psychiatrist however she has not done so, will cont current regimen at this time.   5. Stage 5 chronic kidney disease not on chronic dialysis [facility]) Following with nephrologist at this time.   6. Essential hypertension Not controlled- she has confusion over her medication but knows her blood pressure medication pretty well. States that her lisinopril was stopped (which it was due to CKD by nephrologist), states that she is still taking atenolol and Norvasc which she took this morning. Reports that she has actually been taking the clonidine daily most days which was prescribed to her PRN. Did not take this medication this morning.  -will start clonidine 0.2 mg by mouth twice daily routine due to elevated blood pressure.  -blood pressure improved to 154/84 on recheck.   7. Nonintractable migraine, unspecified migraine type Worse now as blood pressure is not controlled. Has not needed her Imitrex states that the ASA generally helps.   8. Insomnia, unspecified type To not take ambien, question if this is causing increase in confusion.  May use melatonin as needed OTC  9. Vitamin D deficiency conts on Vit D 5,000 daily    Medication list was not correct- there was duplicate medication, medication she was not taking and OTC that pt was taking. Time spent with pt was over 1 hour going through medication list and making changes. Her pill boxes are clearly wrong but she is not taking medication from this.  Had all of her medication [facility] to [facility] who will drop off her medication in a prefilled pill packet.  Pt will bring this pill pack with her to next visit next week. pts up to date medication list below.   Next appt: as scheduled on 01/08/17 Carlos American. [place], [facility] [phone] 8 am - 5 pm) [phone] (after hours)  Allergies as of 01/02/2017      Reactions   Lisinopril Other (See Comments)   Abnormal Kidney Function    Morphine And Related Nausea And Vomiting   Pioglitazone Other (See Comments)   REACTION: Desquamation of skin of the palm   Sulfonamide Derivatives Rash      Medication List       Accurate as of 01/02/17  2:16 PM. Always use your most recent med list.          ALPRAZolam 0.5 MG tablet Commonly known as:  XANAX TAKE 1 TABLET BY MOUTH TWICE DAILY IF NEEDED FOR ANXIETY   amLODipine 10 MG  tablet Commonly known as:  NORVASC Take 1 tablet (10 mg total) by mouth daily.   aspirin EC 325 MG tablet Take 1 tablet (325 mg total) by mouth daily.   atenolol 25 MG tablet Commonly known as:  TENORMIN Take 1 tablet (25 mg total) by mouth daily.   cloNIDine 0.2 MG tablet Commonly known as:  CATAPRES Take 1 tablet (0.2 mg total) by mouth 2 (two) times daily.   clopidogrel 75 MG tablet Commonly known as:  PLAVIX Take 1 tablet (75 mg total) by mouth daily.   cromolyn 4 % ophthalmic solution Commonly known as:  OPTICROM Place 1 drop into both eyes 2 (two) times daily.   ezetimibe 10 MG tablet Commonly known as:  ZETIA Take 1 tablet (10 mg total) by mouth daily.   latanoprost 0.005 % ophthalmic solution Commonly known as:  XALATAN Place 1 drop into both eyes at bedtime. Reported on 08/27/2015   Melatonin 3 MG Tabs 6 mg by mouth as needed for sleep.   PARoxetine 40 MG tablet Commonly known as:  PAXIL Take 1 tablet (40 mg total) by mouth daily.   pravastatin 40 MG tablet Commonly known as:  PRAVACHOL Take 1 tablet (40 mg total) by mouth at bedtime.   SUMAtriptan 25 MG tablet Commonly known as:  IMITREX 1 tablet daily as needed for headache, May repeat in 2 hours if headache persists or recurs. Max of 2 tablets in 24 hours   triamcinolone cream  0.1 % Commonly known as:  KENALOG Apply 1 application topically 3 (three) times daily as needed (rash).   Vitamin D3 5000 units Tabs Take 1 tablet (5,000 Units total) by mouth daily.            Discharge Care Instructions        Start     Ordered   01/02/17 0000  amLODipine (NORVASC) 10 MG tablet  Daily    Comments:  DOSE INCREASE   01/02/17 1016   01/02/17 0000  atenolol (TENORMIN) 25 MG tablet  Daily     01/02/17 1016   01/02/17 0000  Cholecalciferol (VITAMIN D3) 5000 units TABS  Daily     01/02/17 1016   01/02/17 0000  aspirin EC 325 MG tablet  Daily     01/02/17 1016   01/02/17 0000  cloNIDine (CATAPRES) 0.2 MG tablet  2 times daily     01/02/17 1016   01/02/17 0000  clopidogrel (PLAVIX) 75 MG tablet  Daily     01/02/17 1016   01/02/17 0000  ezetimibe (ZETIA) 10 MG tablet  Daily     01/02/17 1016   01/02/17 0000  PARoxetine (PAXIL) 40 MG tablet  Daily     01/02/17 1016   01/02/17 0000  pravastatin (PRAVACHOL) 40 MG tablet  Daily at bedtime     01/02/17 1016   01/02/17 0000  ALPRAZolam (XANAX) 0.5 MG tablet     01/02/17 1017   01/02/17 0000  SUMAtriptan (IMITREX) 25 MG tablet     01/02/17 1018   01/02/17 0000  Melatonin 3 MG TABS    Question:  Supervising Provider  Answer:  Mariea Clonts, TIFFANY L   01/02/17 1031

## 2017-01-05 DIAGNOSIS — Z720 Tobacco use: Secondary | ICD-10-CM | POA: Diagnosis not present

## 2017-01-05 DIAGNOSIS — N185 Chronic kidney disease, stage 5: Secondary | ICD-10-CM | POA: Diagnosis not present

## 2017-01-05 DIAGNOSIS — I120 Hypertensive chronic kidney disease with stage 5 chronic kidney disease or end stage renal disease: Secondary | ICD-10-CM | POA: Diagnosis not present

## 2017-01-05 DIAGNOSIS — I69398 Other sequelae of cerebral infarction: Secondary | ICD-10-CM | POA: Diagnosis not present

## 2017-01-05 DIAGNOSIS — Z7902 Long term (current) use of antithrombotics/antiplatelets: Secondary | ICD-10-CM | POA: Diagnosis not present

## 2017-01-05 DIAGNOSIS — E1122 Type 2 diabetes mellitus with diabetic chronic kidney disease: Secondary | ICD-10-CM | POA: Diagnosis not present

## 2017-01-05 DIAGNOSIS — R2681 Unsteadiness on feet: Secondary | ICD-10-CM | POA: Diagnosis not present

## 2017-01-05 DIAGNOSIS — Z7982 Long term (current) use of aspirin: Secondary | ICD-10-CM | POA: Diagnosis not present

## 2017-01-08 ENCOUNTER — Ambulatory Visit (INDEPENDENT_AMBULATORY_CARE_PROVIDER_SITE_OTHER): Payer: Medicare HMO

## 2017-01-08 ENCOUNTER — Ambulatory Visit (INDEPENDENT_AMBULATORY_CARE_PROVIDER_SITE_OTHER): Payer: Medicare HMO | Admitting: Nurse Practitioner

## 2017-01-08 ENCOUNTER — Encounter: Payer: Self-pay | Admitting: Nurse Practitioner

## 2017-01-08 VITALS — BP 130/78 | HR 53 | Temp 97.7°F | Ht 65.0 in | Wt 175.8 lb

## 2017-01-08 VITALS — BP 130/78 | HR 61 | Temp 97.7°F | Resp 17 | Ht 65.0 in | Wt 175.8 lb

## 2017-01-08 DIAGNOSIS — F418 Other specified anxiety disorders: Secondary | ICD-10-CM | POA: Diagnosis not present

## 2017-01-08 DIAGNOSIS — G43009 Migraine without aura, not intractable, without status migrainosus: Secondary | ICD-10-CM | POA: Diagnosis not present

## 2017-01-08 DIAGNOSIS — N185 Chronic kidney disease, stage 5: Secondary | ICD-10-CM

## 2017-01-08 DIAGNOSIS — Z23 Encounter for immunization: Secondary | ICD-10-CM

## 2017-01-08 DIAGNOSIS — I10 Essential (primary) hypertension: Secondary | ICD-10-CM | POA: Diagnosis not present

## 2017-01-08 DIAGNOSIS — Z135 Encounter for screening for eye and ear disorders: Secondary | ICD-10-CM

## 2017-01-08 DIAGNOSIS — Z0000 Encounter for general adult medical examination without abnormal findings: Secondary | ICD-10-CM

## 2017-01-08 DIAGNOSIS — I639 Cerebral infarction, unspecified: Secondary | ICD-10-CM | POA: Diagnosis not present

## 2017-01-08 NOTE — Patient Instructions (Addendum)
[name] , Thank you for taking time to come for your Medicare Wellness Visit. I appreciate your ongoing commitment to your health goals. Please review the following plan we discussed and let me know if I can assist you in the future.   Screening recommendations/referrals: Colonoscopy up to date. Due 10/16/2020 Mammogram up to date. Due 09/14/2018 Bone Density up to date Recommended yearly ophthalmology/optometry visit for glaucoma screening and checkup Recommended yearly dental visit for hygiene and checkup  Vaccinations: Influenza vaccine given Pneumococcal vaccine up to date.  Tdap vaccine up to date. Due 11/24/2018 Shingles vaccine due, if you decide to get this vaccine we will send prescription to your pharmacy  Advanced directives: Advance directive discussed with you today. I have provided a copy for you to complete at home and have notarized. Once this is complete please bring a copy in to our office so we can scan it into your chart.   Conditions/risks identified: None  Next appointment: Deborah Mustache, NP 01/08/17 @ 10:45am   Preventive Care 71 Years and Older, Female Preventive care refers to lifestyle choices and visits with your health care provider that can promote health and wellness. What does preventive care include?  A yearly physical exam. This is also called an annual well check.  Dental exams once or twice a year.  Routine eye exams. Ask your health care provider how often you should have your eyes checked.  Personal lifestyle choices, including:  Daily care of your teeth and gums.  Regular physical activity.  Eating a healthy diet.  Avoiding tobacco and drug use.  Limiting alcohol use.  Practicing safe sex.  Taking low-dose aspirin every day.  Taking vitamin and mineral supplements as recommended by your health care provider. What happens during an annual well check? The services and screenings done by your health care provider during your  annual well check will depend on your age, overall health, lifestyle risk factors, and family history of disease. Counseling  Your health care provider may ask you questions about your:  Alcohol use.  Tobacco use.  Drug use.  Emotional well-being.  Home and relationship well-being.  Sexual activity.  Eating habits.  History of falls.  Memory and ability to understand (cognition).  Work and work Statistician.  Reproductive health. Screening  You may have the following tests or measurements:  Height, weight, and BMI.  Blood pressure.  Lipid and cholesterol levels. These may be checked every 5 years, or more frequently if you are over 59 years old.  Skin check.  Lung cancer screening. You may have this screening every year starting at age 25 if you have a 30-pack-year history of smoking and currently smoke or have quit within the past 15 years.  Fecal occult blood test (FOBT) of the stool. You may have this test every year starting at age 50.  Flexible sigmoidoscopy or colonoscopy. You may have a sigmoidoscopy every 5 years or a colonoscopy every 10 years starting at age 10.  Hepatitis C blood test.  Hepatitis B blood test.  Sexually transmitted disease (STD) testing.  Diabetes screening. This is done by checking your blood sugar (glucose) after you have not eaten for a while (fasting). You may have this done every 1-3 years.  Bone density scan. This is done to screen for osteoporosis. You may have this done starting at age 25.  Mammogram. This may be done every 1-2 years. Talk to your health care provider about how often you should have regular mammograms. Talk  with your health care provider about your test results, treatment options, and if necessary, the need for more tests. Vaccines  Your health care provider may recommend certain vaccines, such as:  Influenza vaccine. This is recommended every year.  Tetanus, diphtheria, and acellular pertussis (Tdap, Td)  vaccine. You may need a Td booster every 10 years.  Zoster vaccine. You may need this after age 21.  Pneumococcal 13-valent conjugate (PCV13) vaccine. One dose is recommended after age 44.  Pneumococcal polysaccharide (PPSV23) vaccine. One dose is recommended after age 27. Talk to your health care provider about which screenings and vaccines you need and how often you need them. This information is not intended to replace advice given to you by your health care provider. Make sure you discuss any questions you have with your health care provider. Document Released: 05/07/2015 Document Revised: 12/29/2015 Document Reviewed: 02/09/2015 Elsevier Interactive Patient Education  2017 [place] Prevention in the Home Falls can cause injuries. They can happen to people of all ages. There are many things you can do to make your home safe and to help prevent falls. What can I do on the outside of my home?  Regularly fix the edges of walkways and driveways and fix any cracks.  Remove anything that might make you trip as you walk through a door, such as a raised step or threshold.  Trim any bushes or trees on the path to your home.  Use bright outdoor lighting.  Clear any walking paths of anything that might make someone trip, such as rocks or tools.  Regularly check to see if handrails are loose or broken. Make sure that both sides of any steps have handrails.  Any raised decks and porches should have guardrails on the edges.  Have any leaves, snow, or ice cleared regularly.  Use sand or salt on walking paths during winter.  Clean up any spills in your garage right away. This includes oil or grease spills. What can I do in the bathroom?  Use night lights.  Install grab bars by the toilet and in the tub and shower. Do not use towel bars as grab bars.  Use non-skid mats or decals in the tub or shower.  If you need to sit down in the shower, use a plastic, non-slip  stool.  Keep the floor dry. Clean up any water that spills on the floor as soon as it happens.  Remove soap buildup in the tub or shower regularly.  Attach bath mats securely with double-sided non-slip rug tape.  Do not have throw rugs and other things on the floor that can make you trip. What can I do in the bedroom?  Use night lights.  Make sure that you have a light by your bed that is easy to reach.  Do not use any sheets or blankets that are too big for your bed. They should not hang down onto the floor.  Have a firm chair that has side arms. You can use this for support while you get dressed.  Do not have throw rugs and other things on the floor that can make you trip. What can I do in the kitchen?  Clean up any spills right away.  Avoid walking on wet floors.  Keep items that you use a lot in easy-to-reach places.  If you need to reach something above you, use a strong step stool that has a grab bar.  Keep electrical cords out of the way.  Do  not use floor polish or wax that makes floors slippery. If you must use wax, use non-skid floor wax.  Do not have throw rugs and other things on the floor that can make you trip. What can I do with my stairs?  Do not leave any items on the stairs.  Make sure that there are handrails on both sides of the stairs and use them. Fix handrails that are broken or loose. Make sure that handrails are as long as the stairways.  Check any carpeting to make sure that it is firmly attached to the stairs. Fix any carpet that is loose or worn.  Avoid having throw rugs at the top or bottom of the stairs. If you do have throw rugs, attach them to the floor with carpet tape.  Make sure that you have a light switch at the top of the stairs and the bottom of the stairs. If you do not have them, ask someone to add them for you. What else can I do to help prevent falls?  Wear shoes that:  Do not have high heels.  Have rubber bottoms.  Are  comfortable and fit you well.  Are closed at the toe. Do not wear sandals.  If you use a stepladder:  Make sure that it is fully opened. Do not climb a closed stepladder.  Make sure that both sides of the stepladder are locked into place.  Ask someone to hold it for you, if possible.  Clearly mark and make sure that you can see:  Any grab bars or handrails.  First and last steps.  Where the edge of each step is.  Use tools that help you move around (mobility aids) if they are needed. These include:  Canes.  Walkers.  Scooters.  Crutches.  Turn on the lights when you go into a dark area. Replace any light bulbs as soon as they burn out.  Set up your furniture so you have a clear path. Avoid moving your furniture around.  If any of your floors are uneven, fix them.  If there are any pets around you, be aware of where they are.  Review your medicines with your doctor. Some medicines can make you feel dizzy. This can increase your chance of falling. Ask your doctor what other things that you can do to help prevent falls. This information is not intended to replace advice given to you by your health care provider. Make sure you discuss any questions you have with your health care provider. Document Released: 02/04/2009 Document Revised: 09/16/2015 Document Reviewed: 05/15/2014 Elsevier Interactive Patient Education  2017 [employer].

## 2017-01-08 NOTE — Patient Instructions (Addendum)
Make sure you go to your appt with psychiatrist   Follow up in 2 weeks and bring all medication with you at time of visit

## 2017-01-08 NOTE — Progress Notes (Signed)
Provider: [name], NP  Patient Care Team: [name], NP as PCP - General (Nurse Practitioner) Woodroe Mode (Inactive) as Diabetes Educator [name], MD as Consulting Physician (Ophthalmology) [name], MD as Consulting Physician (Nephrology)  Extended Emergency Contact Information Primary Emergency Contact: [facility] Address: [address]          [place], [place] [address] Johnnette Litter of [place] Phone: [phone] Mobile Phone: [phone] Relation: Brother Allergies  Allergen Reactions  . Lisinopril Other (See Comments)    Abnormal Kidney Function   . Morphine And Related Nausea And Vomiting  . Pioglitazone Other (See Comments)    REACTION: Desquamation of skin of the palm  . Sulfonamide Derivatives Rash   Code Status: DNR Goals of Care: Advanced Directive information Advanced Directives 01/08/2017  Does Patient Have a Medical Advance Directive? No  Does patient want to make changes to medical advance directive? -  Would patient like information on creating a medical advance directive? -     Chief Complaint  Patient presents with  . Medical Management of Chronic Issues    Pt is being seen for a complete physical and management of chronic medical conditions.     HPI: Patient is a 71 y.o. female seen in today for an annual wellness exam.   Has not taken medication routinely, has not gotten pill packs from pharmacy but took all of her blood pressure medication this morning. Has not taken any medication except blood pressure medication.  Reports she is having "one of those days" has a headache.  Still following with nephrologist- goes again next month on the 10th.  Does not wish to do HD but may have to, does not wish to die from her renal disease.  States she has appt with psychiatrist next week for depression/agaition- neurologist also had strongly recommended follow up with psych due to her  depression/agitation  Depression screen [facility] 2/9 01/08/2017 06/07/2016 01/06/2016 01/06/2016 08/27/2015  Decreased Interest 1 0 - 0 1  Down, Depressed, Hopeless 1 0 3 2 2   PHQ - 2 Score 2 0 3 2 3   Altered sleeping 0 - 2 3 1   Tired, decreased energy 1 - 2 2 2   Change in appetite 0 - 0 0 2  Feeling bad or failure about yourself  0 - 1 0 1  Trouble concentrating 1 - 0 1 2  Moving slowly or fidgety/restless 1 - 0 1 0  Suicidal thoughts 0 - 0 1 3  PHQ-9 Score 5 - 8 10 14   Difficult doing work/chores Somewhat difficult - - Very difficult Somewhat difficult  Some recent data might be hidden    Fall Risk  01/08/2017 01/02/2017 12/06/2016 09/28/2016 07/10/2016  Falls in the past year? Yes Yes No Yes No  Number falls in past yr: 2 or more 2 or more - 2 or more -  Injury with Fall? No Yes - No -  Risk Factor Category  - - - - -  Risk for fall due to : - - - - -  Risk for fall due to: Comment - - - - -  Follow up - - - - -   MMSE - Mini Mental State Exam 01/08/2017 12/13/2016 05/23/2016 01/06/2016  Orientation to time 5 5 5 4   Orientation to Place 5 5 5 5   Registration 3 3 3 3   Attention/ Calculation 5 5 5 5   Recall 1 1 2 2   Language- name 2 objects 2 2 2  2  Language- repeat 1 1 1 1   Language- follow 3 step command 3 3 3 2   Language- read & follow direction 1 1 1 1   Write a sentence 1 1 1 1   Copy design 0 1 1 0  Total score 27 28 29 26      Health Maintenance  Topic Date Due  . FOOT EXAM  01/05/2017  . OPHTHALMOLOGY EXAM  01/05/2017  . HEMOGLOBIN A1C  03/05/2017  . LIPID PANEL  12/03/2017  . MAMMOGRAM  09/14/2018  . TETANUS/TDAP  11/24/2018  . COLONOSCOPY  10/16/2020  . INFLUENZA VACCINE  Completed  . DEXA SCAN  Completed  . Hepatitis C Screening  Completed  . PNA vac Low Risk Adult  Completed     Past Medical History:  Diagnosis Date  . Anxiety   . Arthritis    lower back and knees   . Asthma    childhood  . Barrett's esophagus   . Depression   . Diabetes mellitus   .  Dysphagia   . Eczema   . History of herpes zoster 02/2010   Recovered fully after period of acute herpetic neuralgia tx w/ gabapentin.   Marland Kitchen Hx MRSA infection 2009  . Hyperlipidemia   . Hypertension   . Neuromuscular disorder (HCC)    chronic pain  . Personal history of colonic polyps 10/17/2010   hyperplastic    Past Surgical History:  Procedure Laterality Date  . ABDOMINAL HYSTERECTOMY     unclear when  . CATARACT EXTRACTION Bilateral   . thigh surg     left side due to MRSA    Social History   Social History  . Marital status: Divorced    Spouse name: N/A  . Number of children: 0  . Years of education: N/A   Occupational History  . Retired [employer]   Social History Main Topics  . Smoking status: Current Every Day Smoker    Packs/day: 1.00    Years: 40.00    Types: Cigarettes  . Smokeless tobacco: Never Used     Comment: smokes 1 pack of cigerettes a week- off and on  . Alcohol use 1.2 oz/week    2 Standard drinks or equivalent per week     Comment: wine and beer- occasionally   . Drug use: No  . Sexual activity: No   Other Topics Concern  . None   Social History Narrative   Financial assistance approved for 100% discount at [facility] and has [facility] card per [employer]   02/10/2010      3 caffeine drinks daily     Family History  Problem Relation Age of Onset  . Hypertension Father   . Kidney disease Father   . Diabetes Brother   . Colon cancer Neg Hx   . CAD Neg Hx     Review of Systems:  Review of Systems  Constitutional: Negative for activity change, appetite change, chills, fatigue and fever.  HENT: Negative for congestion.   Respiratory: Negative for cough, chest tightness and shortness of breath.   Cardiovascular: Negative for chest pain, palpitations and leg swelling.  Endocrine: Negative for polydipsia, polyphagia and polyuria.  Skin: Negative for color change, pallor, rash and wound.  Neurological: Positive for headaches (has them off  and on, worse when blood pressure up. ). Negative for dizziness, syncope and light-headedness.  Psychiatric/Behavioral: Positive for agitation and confusion. Negative for sleep disturbance and suicidal ideas.     Allergies as of 01/08/2017  Reactions   Lisinopril Other (See Comments)   Abnormal Kidney Function    Morphine And Related Nausea And Vomiting   Pioglitazone Other (See Comments)   REACTION: Desquamation of skin of the palm   Sulfonamide Derivatives Rash      Medication List       Accurate as of 01/08/17 10:54 AM. Always use your most recent med list.          ALPRAZolam 0.5 MG tablet Commonly known as:  XANAX TAKE 1 TABLET BY MOUTH TWICE DAILY IF NEEDED FOR ANXIETY   amLODipine 10 MG tablet Commonly known as:  NORVASC Take 1 tablet (10 mg total) by mouth daily.   aspirin EC 325 MG tablet Take 1 tablet (325 mg total) by mouth daily.   atenolol 25 MG tablet Commonly known as:  TENORMIN Take 1 tablet (25 mg total) by mouth daily.   cloNIDine 0.2 MG tablet Commonly known as:  CATAPRES Take 1 tablet (0.2 mg total) by mouth 2 (two) times daily.   clopidogrel 75 MG tablet Commonly known as:  PLAVIX Take 1 tablet (75 mg total) by mouth daily.   cromolyn 4 % ophthalmic solution Commonly known as:  OPTICROM Place 1 drop into both eyes 2 (two) times daily.   ezetimibe 10 MG tablet Commonly known as:  ZETIA Take 1 tablet (10 mg total) by mouth daily.   latanoprost 0.005 % ophthalmic solution Commonly known as:  XALATAN Place 1 drop into both eyes at bedtime. Reported on 08/27/2015   Melatonin 3 MG Tabs 6 mg by mouth as needed for sleep.   PARoxetine 40 MG tablet Commonly known as:  PAXIL Take 1 tablet (40 mg total) by mouth daily.   pravastatin 40 MG tablet Commonly known as:  PRAVACHOL Take 1 tablet (40 mg total) by mouth at bedtime.   SUMAtriptan 25 MG tablet Commonly known as:  IMITREX 1 tablet daily as needed for headache, May repeat in 2 hours  if headache persists or recurs. Max of 2 tablets in 24 hours   triamcinolone cream 0.1 % Commonly known as:  KENALOG Apply 1 application topically 3 (three) times daily as needed (rash).   Vitamin D3 5000 units Tabs Take 1 tablet (5,000 Units total) by mouth daily.         Physical Exam: Vitals:   01/08/17 1040  BP: 130/78  Pulse: 61  Resp: 17  Temp: 97.7 F (36.5 C)  TempSrc: Oral  SpO2: 96%  Weight: 175 lb 12.8 oz (79.7 kg)  Height: 5\' 5"  (1.651 m)   Body mass index is 29.25 kg/m. Physical Exam  Constitutional: She is oriented to person, place, and time. She appears well-developed and well-nourished. No distress.  HENT:  Head: Normocephalic and atraumatic.  Right Ear: External ear normal.  Left Ear: External ear normal.  Nose: Nose normal.  Mouth/Throat: Oropharynx is clear and moist. No oropharyngeal exudate.  Eyes: Pupils are equal, round, and reactive to light. Conjunctivae and EOM are normal.  Neck: Normal range of motion. Neck supple.  Cardiovascular: Normal rate, regular rhythm and normal heart sounds.   No murmur heard. Pulmonary/Chest: Effort normal and breath sounds normal. No respiratory distress. She has no wheezes.  Abdominal: Soft. Bowel sounds are normal. She exhibits no distension.  Musculoskeletal: Normal range of motion. She exhibits no edema or tenderness.  Neurological: She is alert and oriented to person, place, and time. She has normal reflexes.  Normal monofilament   Skin: Skin is warm and  dry.  Psychiatric: Her behavior is normal. Judgment and thought content normal. Her affect is blunt.    Labs reviewed: Basic Metabolic Panel:  Recent Labs  09/28/16 0903 12/02/16 0835 12/02/16 0916 12/03/16 0625  NA 144 140 143 140  K 4.6 4.6 4.6 4.7  CL 115* 113* 113* 111  CO2 18* 18*  --  21*  GLUCOSE 91 100* 89 95  BUN 50* 58* 62* 58*  CREATININE 6.33* 8.42* 8.80* 7.99*  CALCIUM 7.9* 8.8*  --  8.8*  PHOS  --  6.8*  --   --    Liver  Function Tests:  Recent Labs  09/28/16 0903 12/02/16 0835 12/03/16 0625  AST 18 15 13*  ALT 18 14 13*  ALKPHOS 88 104 102  BILITOT 0.3 0.5 0.6  PROT 5.8* 6.2* 6.3*  ALBUMIN 3.1* 3.1* 3.1*   No results for input(s): LIPASE, AMYLASE in the last 8760 hours. No results for input(s): AMMONIA in the last 8760 hours. CBC:  Recent Labs  07/14/16 0559 12/02/16 0835 12/02/16 0916 12/19/16 1221  WBC 6.7 7.7  --   --   NEUTROABS  --  4.4  --   --   HGB 10.6* 9.3* 8.8* 8.9*  HCT 32.1* 27.9* 26.0*  --   MCV 84.9 86.6  --   --   PLT 214 237  --   --    Lipid Panel:  Recent Labs  05/30/16 0910 09/28/16 0903 12/03/16 0625  CHOL 218* 161 252*  HDL 55 62 45  LDLCALC 130* 77 151*  TRIG 163* 110 281*  CHOLHDL 4.0 2.6 5.6   Lab Results  Component Value Date   HGBA1C 5.2 12/03/2016    Procedures: Ct Head Wo Contrast  Result Date: 01/01/2017 CLINICAL DATA:  Headache and hypertension. EXAM: CT HEAD WITHOUT CONTRAST TECHNIQUE: Contiguous axial images were obtained from the base of the skull through the vertex without intravenous contrast. COMPARISON:  Head CT December 02, 2016 and brain MRI December 02, 2016 FINDINGS: Brain: There is age related volume loss, stable. There is no intracranial mass, hemorrhage, extra-axial fluid collection, or midline shift. There is evidence of a prior infarct in the superior posterior left parietal lobe. There is evidence of an infarct at the posterior gray- white compartment junction of the mid right parietal lobe extending to the periphery in this area. There is evidence of a prior small infarct in the anterior left thalamus. There is mild patchy small vessel disease in the centra semiovale bilaterally. No acute infarct is demonstrable on this examination. Vascular: There is no hyperdense vessel. There is calcification in each carotid siphon region. Skull: The bony calvarium appears intact. Sinuses/Orbits: There is mucosal thickening in several ethmoid air  cells bilaterally. Other visualized paranasal sinuses are clear. Visualized orbits appear symmetric bilaterally. Other: Mastoid air cells are clear. IMPRESSION: 1. Age related volume loss. Prior bilateral parietal lobe infarcts, stable. Small right thalamus infarct, not acute. Mild periventricular small vessel disease. No acute infarct evident. 2. No demonstrable intracranial mass hemorrhage, or extra-axial fluid collection. 3.  Foci of arterial vascular calcification. 4.  Mucosal thickening in several ethmoid air cells bilaterally. Electronically Signed   By: Lowella Grip III M.D.   On: 01/01/2017 11:19    Assessment/Plan 1. Wellness examination No new issues today. Pt request to be DNR, paperwork done.  The patient was counseled regarding the appropriate use of alcohol, regular self-examination of the breasts on a monthly basis, prevention of dental and periodontal disease,  diet, regular sustained exercise for at least 30 minutes 5 times per week, routine screening interval for mammogram as recommended by the [place] and ACOG, importance of regular PAP smears, smoking cessation, tobacco use,  and recommended schedule for GI hemoccult testing, colonoscopy, cholesterol, thyroid and diabetes screening. -pt still not taking medication routinely, spoke with pharmacy and verified insurance and plan to deliver medication soon  2. Cerebrovascular accident (CVA), unspecified mechanism ([place]) -stable, following with neurologist, encouraged compliance with asa and plavix.  3. Depression with anxiety On paxil however not controlled, has appt with psychiatrist next week.   4. Stage 5 chronic kidney disease not on chronic dialysis [facility]) Following with nephrologist. Does not wish to do HD however reports she will to avoid dying from renal disease   5. Essential hypertension Improved on current regimen, clonidine 0.2 mg by mouth twice daily routinely added at last visit   6. Nonintractable  migraine, unspecified migraine type Stable, headache worse when blood pressure not controlled.    Next appt: 2 weeks, to bring medication  Aaran Enberg K. Harle Battiest  [facility] [phone] 8 am - 5 pm) [phone] (after hours)

## 2017-01-08 NOTE — Progress Notes (Signed)
Subjective:   [name] is a 71 y.o. female who presents for Medicare Annual (Subsequent) preventive examination.  Last AWV-01/05/2017    Objective:     Vitals: BP 130/78 (BP Location: Left Arm, Patient Position: Sitting)   Pulse (!) 53   Temp 97.7 F (36.5 C) (Oral)   Ht 5\' 5"  (1.651 m)   Wt 175 lb 12.8 oz (79.7 kg)   SpO2 93%   BMI 29.25 kg/m   Body mass index is 29.25 kg/m.   Tobacco History  Smoking Status  . Current Every Day Smoker  . Packs/day: 1.00  . Years: 40.00  . Types: Cigarettes  Smokeless Tobacco  . Never Used    Comment: smokes 1 pack of cigerettes a week- off and on     Ready to quit: Not Answered Counseling given: Not Answered   Past Medical History:  Diagnosis Date  . Anxiety   . Arthritis    lower back and knees   . Asthma    childhood  . Barrett's esophagus   . Depression   . Diabetes mellitus   . Dysphagia   . Eczema   . History of herpes zoster 02/2010   Recovered fully after period of acute herpetic neuralgia tx w/ gabapentin.   Marland Kitchen Hx MRSA infection 2009  . Hyperlipidemia   . Hypertension   . Neuromuscular disorder (HCC)    chronic pain  . Personal history of colonic polyps 10/17/2010   hyperplastic   Past Surgical History:  Procedure Laterality Date  . ABDOMINAL HYSTERECTOMY     unclear when  . CATARACT EXTRACTION Bilateral   . thigh surg     left side due to MRSA   Family History  Problem Relation Age of Onset  . Hypertension Father   . Kidney disease Father   . Diabetes Brother   . Colon cancer Neg Hx   . CAD Neg Hx    History  Sexual Activity  . Sexual activity: No    Outpatient Encounter Prescriptions as of 01/08/2017  Medication Sig  . ALPRAZolam (XANAX) 0.5 MG tablet TAKE 1 TABLET BY MOUTH TWICE DAILY IF NEEDED FOR ANXIETY  . amLODipine (NORVASC) 10 MG tablet Take 1 tablet (10 mg total) by mouth daily.  Marland Kitchen aspirin EC 325 MG tablet Take 1 tablet (325 mg total) by mouth daily.  Marland Kitchen atenolol (TENORMIN)  25 MG tablet Take 1 tablet (25 mg total) by mouth daily.  . Cholecalciferol (VITAMIN D3) 5000 units TABS Take 1 tablet (5,000 Units total) by mouth daily.  . cloNIDine (CATAPRES) 0.2 MG tablet Take 1 tablet (0.2 mg total) by mouth 2 (two) times daily.  . clopidogrel (PLAVIX) 75 MG tablet Take 1 tablet (75 mg total) by mouth daily.  Marland Kitchen latanoprost (XALATAN) 0.005 % ophthalmic solution Place 1 drop into both eyes at bedtime. Reported on 08/27/2015  . PARoxetine (PAXIL) 40 MG tablet Take 1 tablet (40 mg total) by mouth daily.  . SUMAtriptan (IMITREX) 25 MG tablet 1 tablet daily as needed for headache, May repeat in 2 hours if headache persists or recurs. Max of 2 tablets in 24 hours  . triamcinolone cream (KENALOG) 0.1 % Apply 1 application topically 3 (three) times daily as needed (rash).   . cromolyn (OPTICROM) 4 % ophthalmic solution Place 1 drop into both eyes 2 (two) times daily.   Marland Kitchen ezetimibe (ZETIA) 10 MG tablet Take 1 tablet (10 mg total) by mouth daily. (Patient not taking: Reported on 01/08/2017)  .  Melatonin 3 MG TABS 6 mg by mouth as needed for sleep. (Patient not taking: Reported on 01/08/2017)  . pravastatin (PRAVACHOL) 40 MG tablet Take 1 tablet (40 mg total) by mouth at bedtime. (Patient not taking: Reported on 01/08/2017)   No facility-administered encounter medications on file as of 01/08/2017.     Activities of Daily Living In your present state of health, do you have any difficulty performing the following activities: 01/08/2017 10/11/2016  Hearing? N N  Vision? N N  Difficulty concentrating or making decisions? Y N  Walking or climbing stairs? N N  Dressing or bathing? N N  Doing errands, shopping? N -  Preparing Food and eating ? N -  Using the Toilet? N -  In the past six months, have you accidently leaked urine? N -  Do you have problems with loss of bowel control? N -  Managing your Medications? Y -  Managing your Finances? N -  Housekeeping or managing your Housekeeping? N  -  Some recent data might be hidden    Patient Care Team: [name], NP as PCP - General (Nurse Practitioner) Woodroe Mode (Inactive) as Diabetes Educator [name], MD as Consulting Physician (Ophthalmology) [name], MD as Consulting Physician (Nephrology)    Assessment:     Exercise Activities and Dietary recommendations Current Exercise Habits: Home exercise routine, Time (Minutes): 30, Frequency (Times/Week): 7, Weekly Exercise (Minutes/Week): 210, Intensity: Mild, Exercise limited by: None identified  Goals    . Blood Pressure < 140/90    . HEMOGLOBIN A1C < 7.0    . Increase water intake- especially when blood sugars are high    . LDL CALC < 100      Fall Risk Fall Risk  01/08/2017 01/02/2017 12/06/2016 09/28/2016 07/10/2016  Falls in the past year? Yes Yes No Yes No  Number falls in past yr: 2 or more 2 or more - 2 or more -  Injury with Fall? No Yes - No -  Risk Factor Category  - - - - -  Risk for fall due to : - - - - -  Risk for fall due to: Comment - - - - -  Follow up - - - - -   Depression Screen PHQ 2/9 Scores 01/08/2017 06/07/2016 01/06/2016 01/06/2016  PHQ - 2 Score 2 0 3 2  PHQ- 9 Score 5 - 8 10     Cognitive Function MMSE - Mini Mental State Exam 01/08/2017 12/13/2016 05/23/2016 01/06/2016  Orientation to time 5 5 5 4   Orientation to Place 5 5 5 5   Registration 3 3 3 3   Attention/ Calculation 0 5 5 5   Recall 1 1 2 2   Language- name 2 objects 2 2 2 2   Language- repeat 1 1 1 1   Language- follow 3 step command 3 3 3 2   Language- read & follow direction 1 1 1 1   Write a sentence 1 1 1 1   Copy design 0 1 1 0  Total score 22 28 29 26         Immunization History  Administered Date(s) Administered  . Influenza Split 02/03/2011, 01/12/2012  . Influenza Whole 01/07/2010  . Influenza, High Dose Seasonal PF 01/08/2017  . Influenza,inj,Quad PF,6+ Mos 01/03/2013, 02/12/2014, 01/15/2015, 01/06/2016  . Pneumococcal Conjugate-13  04/22/2015  . Pneumococcal Polysaccharide-23 01/12/2012  . Td 11/23/2008  . Zoster 04/24/2014   Screening Tests Health Maintenance  Topic Date Due  . FOOT EXAM  01/05/2017  .  OPHTHALMOLOGY EXAM  01/05/2017  . HEMOGLOBIN A1C  03/05/2017  . LIPID PANEL  12/03/2017  . MAMMOGRAM  09/14/2018  . TETANUS/TDAP  11/24/2018  . COLONOSCOPY  10/16/2020  . INFLUENZA VACCINE  Completed  . DEXA SCAN  Completed  . Hepatitis C Screening  Completed  . PNA vac Low Risk Adult  Completed      Plan:  I have personally reviewed and addressed the Medicare Annual Wellness questionnaire and have noted the following in the patient's chart:  A. Medical and social history B. Use of alcohol, tobacco or illicit drugs  C. Current medications and supplements D. Functional ability and status E.  Nutritional status F.  Physical activity G. Advance directives H. List of other physicians I.  Hospitalizations, surgeries, and ER visits in previous 12 months J.  [place] to include hearing, vision, cognitive, depression L. Referrals and appointments - none  In addition, I have reviewed and discussed with patient certain preventive protocols, quality metrics, and best practice recommendations. A written personalized care plan for preventive services as well as general preventive health recommendations were provided to patient.  See attached scanned questionnaire for additional information.   Signed,   Rich Reining, RN Nurse Health Advisor   Quick Notes   Health Maintenance: Flu vaccine given. Eye exam referral sent. Foot exam due     Abnormal Screen: PHQ-9:5 MMSE 22/30. Did not pass clock drawing.     Patient Concerns: none     Nurse Concerns: Depression

## 2017-01-11 ENCOUNTER — Telehealth: Payer: Self-pay | Admitting: *Deleted

## 2017-01-11 ENCOUNTER — Other Ambulatory Visit: Payer: Self-pay | Admitting: Nurse Practitioner

## 2017-01-11 DIAGNOSIS — Z122 Encounter for screening for malignant neoplasm of respiratory organs: Secondary | ICD-10-CM

## 2017-01-11 NOTE — Telephone Encounter (Signed)
-----   Message from [name], NP sent at 01/11/2017 10:29 AM EDT ----- Please call pt and let her know we have ordered her yearly screening lung CT since she is a smoker, also had nodules on last screening and yearly scans were recommended. Pending insurance and then they will schedule.  ----- Message ----- From: [name], NP Sent: 01/11/2017 To: [name], NP  Order CT screening lung- nodules found but not suspicious recommend 1 year follow up

## 2017-01-11 NOTE — Telephone Encounter (Signed)
Called patient to inform her about CT screening order for lung nodules follow up. I didn't get an answer to the phone call will try again.

## 2017-01-16 ENCOUNTER — Encounter (HOSPITAL_COMMUNITY)
Admission: RE | Admit: 2017-01-16 | Discharge: 2017-01-16 | Disposition: A | Discharge status: Home / Self Care | Payer: Medicare HMO | Source: Ambulatory Visit | Attending: Nephrology | Admitting: Nephrology

## 2017-01-16 ENCOUNTER — Encounter (HOSPITAL_COMMUNITY): Payer: Self-pay

## 2017-01-16 DIAGNOSIS — N185 Chronic kidney disease, stage 5: Secondary | ICD-10-CM | POA: Diagnosis not present

## 2017-01-16 DIAGNOSIS — D631 Anemia in chronic kidney disease: Secondary | ICD-10-CM | POA: Insufficient documentation

## 2017-01-16 LAB — RENAL FUNCTION PANEL
Albumin: 3.1 g/dL — ABNORMAL LOW (ref 3.5–5.0)
Anion gap: 8 (ref 5–15)
BUN: 52 mg/dL — AB (ref 6–20)
CHLORIDE: 111 mmol/L (ref 101–111)
CO2: 19 mmol/L — ABNORMAL LOW (ref 22–32)
CREATININE: 7.83 mg/dL — AB (ref 0.44–1.00)
Calcium: 8.4 mg/dL — ABNORMAL LOW (ref 8.9–10.3)
GFR calc Af Amer: 5 mL/min — ABNORMAL LOW (ref >60)
GFR, EST NON AFRICAN AMERICAN: 5 mL/min — AB (ref >60)
Glucose, Bld: 111 mg/dL — ABNORMAL HIGH (ref 65–99)
Phosphorus: 7.3 mg/dL — ABNORMAL HIGH (ref 2.5–4.6)
Potassium: 5.3 mmol/L — ABNORMAL HIGH (ref 3.5–5.1)
SODIUM: 138 mmol/L (ref 135–145)

## 2017-01-16 LAB — CBC
HCT: 29.2 % — ABNORMAL LOW (ref 36.0–46.0)
Hemoglobin: 9.7 g/dL — ABNORMAL LOW (ref 12.0–15.0)
MCH: 29.3 pg (ref 26.0–34.0)
MCHC: 33.2 g/dL (ref 30.0–36.0)
MCV: 88.2 fL (ref 78.0–100.0)
PLATELETS: 217 10*3/uL (ref 150–400)
RBC: 3.31 MIL/uL — ABNORMAL LOW (ref 3.87–5.11)
RDW: 14.9 % (ref 11.5–15.5)
WBC: 5.9 10*3/uL (ref 4.0–10.5)

## 2017-01-16 MED ORDER — DARBEPOETIN ALFA 60 MCG/0.3ML IJ SOSY
PREFILLED_SYRINGE | INTRAMUSCULAR | Status: AC
  Filled 2017-01-16: qty 0.3

## 2017-01-16 MED ORDER — DARBEPOETIN ALFA 60 MCG/0.3ML IJ SOSY
60.0000 ug | PREFILLED_SYRINGE | INTRAMUSCULAR | Status: DC
  Administered 2017-01-16: 10:00:00 60 ug via SUBCUTANEOUS

## 2017-01-18 ENCOUNTER — Encounter: Payer: Self-pay | Admitting: *Deleted

## 2017-01-22 ENCOUNTER — Encounter: Payer: Medicare HMO | Admitting: Surgery

## 2017-01-22 ENCOUNTER — Ambulatory Visit (HOSPITAL_COMMUNITY): Payer: Medicare HMO | Attending: Surgery

## 2017-01-22 ENCOUNTER — Ambulatory Visit (HOSPITAL_COMMUNITY): Admission: RE | Admit: 2017-01-22 | Payer: Medicare HMO | Source: Ambulatory Visit

## 2017-01-31 ENCOUNTER — Encounter: Payer: Self-pay | Admitting: Vascular Surgery

## 2017-01-31 DIAGNOSIS — D631 Anemia in chronic kidney disease: Secondary | ICD-10-CM | POA: Diagnosis not present

## 2017-01-31 DIAGNOSIS — I63512 Cerebral infarction due to unspecified occlusion or stenosis of left middle cerebral artery: Secondary | ICD-10-CM | POA: Diagnosis not present

## 2017-01-31 DIAGNOSIS — N2581 Secondary hyperparathyroidism of renal origin: Secondary | ICD-10-CM | POA: Diagnosis not present

## 2017-01-31 DIAGNOSIS — E1129 Type 2 diabetes mellitus with other diabetic kidney complication: Secondary | ICD-10-CM | POA: Diagnosis not present

## 2017-01-31 DIAGNOSIS — I120 Hypertensive chronic kidney disease with stage 5 chronic kidney disease or end stage renal disease: Secondary | ICD-10-CM | POA: Diagnosis not present

## 2017-01-31 DIAGNOSIS — Z720 Tobacco use: Secondary | ICD-10-CM | POA: Diagnosis not present

## 2017-01-31 DIAGNOSIS — Z6832 Body mass index (BMI) 32.0-32.9, adult: Secondary | ICD-10-CM | POA: Diagnosis not present

## 2017-01-31 DIAGNOSIS — N179 Acute kidney failure, unspecified: Secondary | ICD-10-CM | POA: Diagnosis not present

## 2017-01-31 DIAGNOSIS — N185 Chronic kidney disease, stage 5: Secondary | ICD-10-CM | POA: Diagnosis not present

## 2017-02-01 ENCOUNTER — Other Ambulatory Visit: Payer: Self-pay | Admitting: *Deleted

## 2017-02-01 ENCOUNTER — Ambulatory Visit (INDEPENDENT_AMBULATORY_CARE_PROVIDER_SITE_OTHER): Payer: Medicare HMO | Admitting: Vascular Surgery

## 2017-02-01 ENCOUNTER — Ambulatory Visit (HOSPITAL_COMMUNITY)
Admission: RE | Admit: 2017-02-01 | Discharge: 2017-02-01 | Disposition: A | Discharge status: Home / Self Care | Payer: Medicare HMO | Source: Ambulatory Visit | Attending: Vascular Surgery | Admitting: Vascular Surgery

## 2017-02-01 ENCOUNTER — Encounter: Payer: Self-pay | Admitting: *Deleted

## 2017-02-01 ENCOUNTER — Encounter: Payer: Self-pay | Admitting: Vascular Surgery

## 2017-02-01 ENCOUNTER — Ambulatory Visit (INDEPENDENT_AMBULATORY_CARE_PROVIDER_SITE_OTHER)
Admission: RE | Admit: 2017-02-01 | Discharge: 2017-02-01 | Disposition: A | Discharge status: Home / Self Care | Payer: Medicare HMO | Source: Ambulatory Visit | Attending: Surgery | Admitting: Surgery

## 2017-02-01 VITALS — BP 92/69 | HR 60 | Temp 97.1°F | Resp 16 | Ht 65.0 in | Wt 163.0 lb

## 2017-02-01 DIAGNOSIS — Z01810 Encounter for preprocedural cardiovascular examination: Secondary | ICD-10-CM | POA: Diagnosis not present

## 2017-02-01 DIAGNOSIS — N185 Chronic kidney disease, stage 5: Secondary | ICD-10-CM

## 2017-02-01 NOTE — Progress Notes (Signed)
Referring Physician: Dr Arty Baumgartner  Patient name: [name] MRN: [id number] DOB: 1945-07-08 Sex: female  REASON FOR CONSULT: Hemodialysis access, CKD 5  HPI: [name] is a 71 y.o. 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 She has not had any prior access procedures. She is right-handed. She denies any numbness or tingling in her hands. Other medical problems include anxiety, depression, diabetes, hypertension hyperlipidemia all of which are currently stable.  Past Medical History:  Diagnosis Date  . Anxiety   . Arthritis    lower back and knees   . Asthma    childhood  . Barrett's esophagus   . Depression   . Diabetes mellitus   . Dysphagia   . Eczema   . History of herpes zoster 02/2010   Recovered fully after period of acute herpetic neuralgia tx w/ gabapentin.   Marland Kitchen Hx MRSA infection 2009  . Hyperlipidemia   . Hypertension   . Neuromuscular disorder (HCC)    chronic pain  . Personal history of colonic polyps 10/17/2010   hyperplastic   Past Surgical History:  Procedure Laterality Date  . ABDOMINAL HYSTERECTOMY     unclear when  . CATARACT EXTRACTION Bilateral   . thigh surg     left side due to MRSA    Family History  Problem Relation Age of Onset  . Hypertension Father   . Kidney disease Father   . Diabetes Brother   . Colon cancer Neg Hx   . CAD Neg Hx     SOCIAL HISTORY: Social History   Social History  . Marital status: Divorced    Spouse name: N/A  . Number of children: 0  . Years of education: N/A   Occupational History  . Retired [employer]   Social History Main Topics  . Smoking status: Current Every Day Smoker    Packs/day: 1.00    Years: 40.00    Types: Cigarettes  . Smokeless tobacco: Never Used     Comment: smokes 1 pack of cigerettes a week- off and on  . Alcohol use 1.2 oz/week    2 Standard drinks or equivalent per week     Comment: wine and beer- occasionally   . Drug use: No  . Sexual  activity: No   Other Topics Concern  . Not on file   Social History Narrative   Financial assistance approved for 100% discount at [facility] and has [facility] card per Bonna Gains   02/10/2010      3 caffeine drinks daily     Allergies  Allergen Reactions  . Lisinopril Other (See Comments)    Abnormal Kidney Function   . Morphine And Related Nausea And Vomiting  . Pioglitazone Other (See Comments)    REACTION: Desquamation of skin of the palm  . Sulfonamide Derivatives Rash    Current Outpatient Prescriptions  Medication Sig Dispense Refill  . ALPRAZolam (XANAX) 0.5 MG tablet TAKE 1 TABLET BY MOUTH TWICE DAILY IF NEEDED FOR ANXIETY 60 tablet 2  . amLODipine (NORVASC) 10 MG tablet Take 1 tablet (10 mg total) by mouth daily. 30 tablet 6  . aspirin EC 325 MG tablet Take 1 tablet (325 mg total) by mouth daily. 30 tablet 6  . atenolol (TENORMIN) 25 MG tablet Take 1 tablet (25 mg total) by mouth daily. 30 tablet 6  . Cholecalciferol (VITAMIN D3) 5000 units TABS Take 1 tablet (5,000 Units total) by mouth daily. 30 tablet 6  .  cloNIDine (CATAPRES) 0.2 MG tablet Take 1 tablet (0.2 mg total) by mouth 2 (two) times daily. 60 tablet 6  . clopidogrel (PLAVIX) 75 MG tablet Take 1 tablet (75 mg total) by mouth daily. 30 tablet 6  . cromolyn (OPTICROM) 4 % ophthalmic solution Place 1 drop into both eyes 2 (two) times daily.   0  . ezetimibe (ZETIA) 10 MG tablet Take 1 tablet (10 mg total) by mouth daily. 30 tablet 6  . latanoprost (XALATAN) 0.005 % ophthalmic solution Place 1 drop into both eyes at bedtime. Reported on 08/27/2015    . Melatonin 3 MG TABS 6 mg by mouth as needed for sleep.  0  . PARoxetine (PAXIL) 40 MG tablet Take 1 tablet (40 mg total) by mouth daily. 30 tablet 6  . pravastatin (PRAVACHOL) 40 MG tablet Take 1 tablet (40 mg total) by mouth at bedtime. 30 tablet 6  . SUMAtriptan (IMITREX) 25 MG tablet 1 tablet daily as needed for headache, May repeat in 2 hours if headache persists or  recurs. Max of 2 tablets in 24 hours 30 tablet 0  . triamcinolone cream (KENALOG) 0.1 % Apply 1 application topically 3 (three) times daily as needed (rash).   0   No current facility-administered medications for this visit.     ROS:   General:  No weight loss, Fever, chills  HEENT: No recent headaches, no nasal bleeding, no visual changes, no sore throat  Neurologic: No dizziness, blackouts, seizures. No recent symptoms of stroke or mini- stroke. No recent episodes of slurred speech, or temporary blindness.  Cardiac: No recent episodes of chest pain/pressure, no shortness of breath at rest.  No shortness of breath with exertion.  Denies history of atrial fibrillation or irregular heartbeat  Vascular: No history of rest pain in feet.  No history of claudication.  No history of non-healing ulcer, No history of DVT   Pulmonary: No home oxygen, no productive cough, no hemoptysis,  No asthma or wheezing  Musculoskeletal:  [ ]  Arthritis, [ ]  Low back pain,  [ ]  Joint pain  Hematologic:No history of hypercoagulable state.  No history of easy bleeding.  No history of anemia  Gastrointestinal: No hematochezia or melena,  No gastroesophageal reflux, no trouble swallowing  Urinary: [X]  chronic Kidney disease, [ ]  on HD - [ ]  MWF or [ ]  TTHS, [ ]  Burning with urination, [ ]  Frequent urination, [ ]  Difficulty urinating;   Skin: No rashes  Psychological: No history of anxiety,  No history of depression   Physical Examination  Vitals:   02/01/17 1130  BP: 92/69  Pulse: 60  Resp: 16  Temp: (!) 97.1 F (36.2 C)  TempSrc: Oral  SpO2: 97%  Weight: 163 lb (73.9 kg)  Height: 5\' 5"  (1.651 m)    Body mass index is 27.12 kg/m.  General:  Alert and oriented, no acute distress HEENT: Normal Neck: No bruit or JVD Pulmonary: Clear to auscultation bilaterally Cardiac: Regular Rate and Rhythm without murmur Abdomen: Soft, non-tender, non-distended, no mass Skin: No rash Extremity Pulses:   2+ radial, brachial pulses bilaterally Musculoskeletal: No deformity or edema  Neurologic: Upper and lower extremity motor 5/5 and symmetric  DATA:  Patient had a vein mapping ultrasound as well as an upper extremity arterial duplex today which I reviewed and interpreted. The cephalic vein was small in the forearm bilaterally. The radial artery was also small in the forearm bilaterally. The brachial artery was 3-3-1/2 mm. The cephalic vein and  basilic veins in the upper arms were both less than 3 mm.  ASSESSMENT:  Patient is CKD 5 and needs long-term hemodialysis access. She has small poor quality veins bilaterally. I discussed with her the possibility of placement of a left brachiocephalic AV fistula but also discussed with her that she may require graft. Risks benefits possible, indications and procedure details including but not limited to bleeding infection ischemic steal graft thrombosis requirement for other future procedures non-maturation of fistula were all discussed with the patient today. She understands and agrees to proceed.   PLAN: Left brachiocephalic AV fistula versus AV graft 02/13/2017   Ruta Hinds, MD [facility] [place] Office: [phone] Pager: [phone]

## 2017-02-06 ENCOUNTER — Ambulatory Visit: Payer: Self-pay

## 2017-02-12 ENCOUNTER — Encounter (HOSPITAL_COMMUNITY): Payer: Self-pay | Admitting: *Deleted

## 2017-02-12 NOTE — Anesthesia Preprocedure Evaluation (Addendum)
Anesthesia Evaluation  Patient identified by MRN, date of birth, ID band Patient awake    Reviewed: Allergy & Precautions, NPO status , Patient's Chart, lab work & pertinent test results  History of Anesthesia Complications Negative for: history of anesthetic complications  Airway Mallampati: II  TM Distance: >3 FB Neck ROM: Full    Dental no notable dental hx. (+) Dental Advisory Given   Pulmonary asthma , Current Smoker,    Pulmonary exam normal        Cardiovascular hypertension, Pt. on home beta blockers Normal cardiovascular exam     Neuro/Psych PSYCHIATRIC DISORDERS Anxiety Depression CVA negative neurological ROS     GI/Hepatic Neg liver ROS, GERD  ,  Endo/Other  diabetes  Renal/GU CRFRenal disease     Musculoskeletal negative musculoskeletal ROS (+)   Abdominal   Peds  Hematology negative hematology ROS (+)   Anesthesia Other Findings Day of surgery medications reviewed with the patient.  Reproductive/Obstetrics                            Anesthesia Physical Anesthesia Plan  ASA: III  Anesthesia Plan: General   Post-op Pain Management:    Induction: Intravenous  PONV Risk Score and Plan: 2 and Ondansetron, Dexamethasone and Treatment may vary due to age or medical condition  Airway Management Planned: LMA  Additional Equipment:   Intra-op Plan:   Post-operative Plan: Extubation in OR  Informed Consent: I have reviewed the patients History and Physical, chart, labs and discussed the procedure including the risks, benefits and alternatives for the proposed anesthesia with the patient or authorized representative who has indicated his/her understanding and acceptance.   Dental advisory given  Plan Discussed with: CRNA, Anesthesiologist and Surgeon  Anesthesia Plan Comments:        Anesthesia Quick Evaluation

## 2017-02-13 ENCOUNTER — Encounter (HOSPITAL_COMMUNITY): Payer: Self-pay

## 2017-02-13 ENCOUNTER — Ambulatory Visit (HOSPITAL_COMMUNITY)
Admission: RE | Admit: 2017-02-13 | Discharge: 2017-02-13 | Disposition: A | Discharge status: Home / Self Care | Payer: Medicare HMO | Source: Ambulatory Visit | Attending: Vascular Surgery | Admitting: Vascular Surgery

## 2017-02-13 ENCOUNTER — Ambulatory Visit (HOSPITAL_COMMUNITY): Payer: Medicare HMO | Admitting: Anesthesiology

## 2017-02-13 ENCOUNTER — Encounter (HOSPITAL_COMMUNITY)
Admission: RE | Disposition: A | Discharge status: Home / Self Care | Payer: Self-pay | Source: Ambulatory Visit | Attending: Vascular Surgery

## 2017-02-13 ENCOUNTER — Ambulatory Visit (HOSPITAL_COMMUNITY): Admission: RE | Admit: 2017-02-13 | Payer: Medicare HMO | Source: Ambulatory Visit | Admitting: Vascular Surgery

## 2017-02-13 ENCOUNTER — Telehealth: Payer: Self-pay | Admitting: Vascular Surgery

## 2017-02-13 ENCOUNTER — Ambulatory Visit (HOSPITAL_COMMUNITY): Payer: Medicare HMO | Admitting: Psychiatry

## 2017-02-13 DIAGNOSIS — E785 Hyperlipidemia, unspecified: Secondary | ICD-10-CM | POA: Diagnosis not present

## 2017-02-13 DIAGNOSIS — Z7902 Long term (current) use of antithrombotics/antiplatelets: Secondary | ICD-10-CM | POA: Insufficient documentation

## 2017-02-13 DIAGNOSIS — Z882 Allergy status to sulfonamides status: Secondary | ICD-10-CM | POA: Insufficient documentation

## 2017-02-13 DIAGNOSIS — I120 Hypertensive chronic kidney disease with stage 5 chronic kidney disease or end stage renal disease: Secondary | ICD-10-CM | POA: Diagnosis not present

## 2017-02-13 DIAGNOSIS — F17210 Nicotine dependence, cigarettes, uncomplicated: Secondary | ICD-10-CM | POA: Insufficient documentation

## 2017-02-13 DIAGNOSIS — M479 Spondylosis, unspecified: Secondary | ICD-10-CM | POA: Diagnosis not present

## 2017-02-13 DIAGNOSIS — M170 Bilateral primary osteoarthritis of knee: Secondary | ICD-10-CM | POA: Diagnosis not present

## 2017-02-13 DIAGNOSIS — J45909 Unspecified asthma, uncomplicated: Secondary | ICD-10-CM | POA: Insufficient documentation

## 2017-02-13 DIAGNOSIS — Z7982 Long term (current) use of aspirin: Secondary | ICD-10-CM | POA: Diagnosis not present

## 2017-02-13 DIAGNOSIS — G8929 Other chronic pain: Secondary | ICD-10-CM | POA: Insufficient documentation

## 2017-02-13 DIAGNOSIS — E1122 Type 2 diabetes mellitus with diabetic chronic kidney disease: Secondary | ICD-10-CM | POA: Diagnosis not present

## 2017-02-13 DIAGNOSIS — F329 Major depressive disorder, single episode, unspecified: Secondary | ICD-10-CM | POA: Diagnosis not present

## 2017-02-13 DIAGNOSIS — N186 End stage renal disease: Secondary | ICD-10-CM | POA: Insufficient documentation

## 2017-02-13 DIAGNOSIS — N185 Chronic kidney disease, stage 5: Secondary | ICD-10-CM | POA: Diagnosis not present

## 2017-02-13 DIAGNOSIS — K219 Gastro-esophageal reflux disease without esophagitis: Secondary | ICD-10-CM | POA: Diagnosis not present

## 2017-02-13 DIAGNOSIS — F419 Anxiety disorder, unspecified: Secondary | ICD-10-CM | POA: Insufficient documentation

## 2017-02-13 DIAGNOSIS — Z8614 Personal history of Methicillin resistant Staphylococcus aureus infection: Secondary | ICD-10-CM | POA: Insufficient documentation

## 2017-02-13 DIAGNOSIS — E119 Type 2 diabetes mellitus without complications: Secondary | ICD-10-CM | POA: Diagnosis not present

## 2017-02-13 HISTORY — PX: AV FISTULA PLACEMENT: SHX1204

## 2017-02-13 LAB — POCT I-STAT 4, (NA,K, GLUC, HGB,HCT)
GLUCOSE: 105 mg/dL — AB (ref 65–99)
HCT: 28 % — ABNORMAL LOW (ref 36.0–46.0)
Hemoglobin: 9.5 g/dL — ABNORMAL LOW (ref 12.0–15.0)
Potassium: 5.3 mmol/L — ABNORMAL HIGH (ref 3.5–5.1)
Sodium: 142 mmol/L (ref 135–145)

## 2017-02-13 LAB — SURGICAL PCR SCREEN
MRSA, PCR: NEGATIVE
STAPHYLOCOCCUS AUREUS: POSITIVE — AB

## 2017-02-13 LAB — GLUCOSE, CAPILLARY: Glucose-Capillary: 110 mg/dL — ABNORMAL HIGH (ref 65–99)

## 2017-02-13 SURGERY — ARTERIOVENOUS (AV) FISTULA CREATION
Anesthesia: Monitor Anesthesia Care | Site: Arm Upper | Laterality: Left

## 2017-02-13 MED ORDER — CHLORHEXIDINE GLUCONATE 4 % EX LIQD: 60.0000 mL | Freq: Once | CUTANEOUS | Status: DC

## 2017-02-13 MED ORDER — EPHEDRINE 5 MG/ML INJ
INTRAVENOUS | Status: AC
  Filled 2017-02-13: qty 10

## 2017-02-13 MED ORDER — GLYCOPYRROLATE 0.2 MG/ML IV SOSY
PREFILLED_SYRINGE | INTRAVENOUS | Status: DC | PRN
  Administered 2017-02-13: .2 mg via INTRAVENOUS

## 2017-02-13 MED ORDER — PROPOFOL 500 MG/50ML IV EMUL
INTRAVENOUS | Status: DC | PRN
  Administered 2017-02-13: 75 ug/kg/min via INTRAVENOUS

## 2017-02-13 MED ORDER — LIDOCAINE HCL (PF) 1 % IJ SOLN
INTRAMUSCULAR | Status: DC | PRN
  Administered 2017-02-13: 30 mL

## 2017-02-13 MED ORDER — HEPARIN SODIUM (PORCINE) 1000 UNIT/ML IJ SOLN
INTRAMUSCULAR | Status: AC
  Filled 2017-02-13: qty 1

## 2017-02-13 MED ORDER — DEXTROSE 5 % IV SOLN
1.5000 g | INTRAVENOUS | Status: AC
  Administered 2017-02-13: 1.5 g via INTRAVENOUS

## 2017-02-13 MED ORDER — DEXAMETHASONE SODIUM PHOSPHATE 10 MG/ML IJ SOLN
INTRAMUSCULAR | Status: AC
  Filled 2017-02-13: qty 1

## 2017-02-13 MED ORDER — MIDAZOLAM HCL 2 MG/2ML IJ SOLN
INTRAMUSCULAR | Status: AC
  Filled 2017-02-13: qty 2

## 2017-02-13 MED ORDER — MIDAZOLAM HCL 5 MG/5ML IJ SOLN
INTRAMUSCULAR | Status: DC | PRN
  Administered 2017-02-13: 1 mg via INTRAVENOUS

## 2017-02-13 MED ORDER — LIDOCAINE 2% (20 MG/ML) 5 ML SYRINGE
INTRAMUSCULAR | Status: AC
  Filled 2017-02-13: qty 5

## 2017-02-13 MED ORDER — SODIUM CHLORIDE 0.9 % IV SOLN
INTRAVENOUS | Status: DC | PRN
  Administered 2017-02-13 (×2): via INTRAVENOUS

## 2017-02-13 MED ORDER — 0.9 % SODIUM CHLORIDE (POUR BTL) OPTIME
TOPICAL | Status: DC | PRN
  Administered 2017-02-13: 1000 mL

## 2017-02-13 MED ORDER — PROMETHAZINE HCL 25 MG/ML IJ SOLN: 6.2500 mg | INTRAMUSCULAR | Status: DC | PRN

## 2017-02-13 MED ORDER — HEMOSTATIC AGENTS (NO CHARGE) OPTIME
TOPICAL | Status: DC | PRN
  Administered 2017-02-13: 1 via TOPICAL

## 2017-02-13 MED ORDER — ATENOLOL 25 MG PO TABS
25.0000 mg | ORAL_TABLET | Freq: Once | ORAL | Status: AC
  Administered 2017-02-13: 25 mg via ORAL
  Filled 2017-02-13: qty 1

## 2017-02-13 MED ORDER — PROPOFOL 10 MG/ML IV BOLUS
INTRAVENOUS | Status: DC | PRN
Start: 2017-02-13 — End: 2017-02-13
  Administered 2017-02-13: 150 mg via INTRAVENOUS
  Administered 2017-02-13: 20 mg via INTRAVENOUS

## 2017-02-13 MED ORDER — PROTAMINE SULFATE 10 MG/ML IV SOLN
INTRAVENOUS | Status: DC | PRN
  Administered 2017-02-13 (×2): 10 mg via INTRAVENOUS

## 2017-02-13 MED ORDER — FENTANYL CITRATE (PF) 250 MCG/5ML IJ SOLN
INTRAMUSCULAR | Status: AC
  Filled 2017-02-13: qty 5

## 2017-02-13 MED ORDER — DEXTROSE 5 % IV SOLN
INTRAVENOUS | Status: AC
  Filled 2017-02-13: qty 1.5

## 2017-02-13 MED ORDER — ONDANSETRON HCL 4 MG/2ML IJ SOLN
INTRAMUSCULAR | Status: DC | PRN
  Administered 2017-02-13: 4 mg via INTRAVENOUS

## 2017-02-13 MED ORDER — DEXAMETHASONE SODIUM PHOSPHATE 10 MG/ML IJ SOLN
INTRAMUSCULAR | Status: DC | PRN
  Administered 2017-02-13: 10 mg via INTRAVENOUS

## 2017-02-13 MED ORDER — ONDANSETRON HCL 4 MG/2ML IJ SOLN
INTRAMUSCULAR | Status: AC
  Filled 2017-02-13: qty 2

## 2017-02-13 MED ORDER — SODIUM CHLORIDE 0.9 % IV SOLN
INTRAVENOUS | Status: DC | PRN
  Administered 2017-02-13: 07:00:00

## 2017-02-13 MED ORDER — OXYCODONE-ACETAMINOPHEN 5-325 MG PO TABS: 1.0000 | ORAL_TABLET | Freq: Four times a day (QID) | ORAL | 0 refills | Status: DC | PRN

## 2017-02-13 MED ORDER — PROTAMINE SULFATE 10 MG/ML IV SOLN
INTRAVENOUS | Status: AC
  Filled 2017-02-13: qty 5

## 2017-02-13 MED ORDER — PROPOFOL 10 MG/ML IV BOLUS
INTRAVENOUS | Status: AC
  Filled 2017-02-13: qty 20

## 2017-02-13 MED ORDER — SODIUM CHLORIDE 0.9 % IV SOLN: INTRAVENOUS | Status: DC

## 2017-02-13 MED ORDER — LIDOCAINE 2% (20 MG/ML) 5 ML SYRINGE
INTRAMUSCULAR | Status: DC | PRN
  Administered 2017-02-13 (×2): 40 mg via INTRAVENOUS

## 2017-02-13 MED ORDER — HYDROMORPHONE HCL 1 MG/ML IJ SOLN: 0.2500 mg | INTRAMUSCULAR | Status: DC | PRN

## 2017-02-13 MED ORDER — HEPARIN SODIUM (PORCINE) 1000 UNIT/ML IJ SOLN
INTRAMUSCULAR | Status: DC | PRN
  Administered 2017-02-13: 5000 [IU] via INTRAVENOUS

## 2017-02-13 MED ORDER — EPHEDRINE SULFATE-NACL 50-0.9 MG/10ML-% IV SOSY
PREFILLED_SYRINGE | INTRAVENOUS | Status: DC | PRN
  Administered 2017-02-13 (×3): 10 mg via INTRAVENOUS

## 2017-02-13 MED ORDER — FENTANYL CITRATE (PF) 100 MCG/2ML IJ SOLN
INTRAMUSCULAR | Status: DC | PRN
  Administered 2017-02-13: 25 ug via INTRAVENOUS

## 2017-02-13 MED ORDER — LIDOCAINE HCL (PF) 1 % IJ SOLN
INTRAMUSCULAR | Status: AC
  Filled 2017-02-13: qty 30

## 2017-02-13 MED ORDER — PHENYLEPHRINE HCL 10 MG/ML IJ SOLN
INTRAVENOUS | Status: DC | PRN
  Administered 2017-02-13: 25 ug/min via INTRAVENOUS

## 2017-02-13 SURGICAL SUPPLY — 38 items
ADH SKN CLS APL DERMABOND .7 (GAUZE/BANDAGES/DRESSINGS) ×1
AGENT HMST SPONGE THK3/8 (HEMOSTASIS) ×1
ARMBAND PINK RESTRICT EXTREMIT (MISCELLANEOUS) ×4 IMPLANT
CANISTER SUCT 3000ML PPV (MISCELLANEOUS) ×2 IMPLANT
CANNULA VESSEL 3MM 2 BLNT TIP (CANNULA) ×2 IMPLANT
CLIP VESOCCLUDE MED 6/CT (CLIP) ×2 IMPLANT
CLIP VESOCCLUDE SM WIDE 6/CT (CLIP) ×2 IMPLANT
COVER PROBE W GEL 5X96 (DRAPES) IMPLANT
DECANTER SPIKE VIAL GLASS SM (MISCELLANEOUS) ×2 IMPLANT
DERMABOND ADVANCED (GAUZE/BANDAGES/DRESSINGS) ×1
DERMABOND ADVANCED .7 DNX12 (GAUZE/BANDAGES/DRESSINGS) ×1 IMPLANT
DRAIN PENROSE 1/4X12 LTX STRL (WOUND CARE) ×2 IMPLANT
ELECT REM PT RETURN 9FT ADLT (ELECTROSURGICAL) ×2
ELECTRODE REM PT RTRN 9FT ADLT (ELECTROSURGICAL) ×1 IMPLANT
GLOVE BIO SURGEON STRL SZ 6.5 (GLOVE) ×3 IMPLANT
GLOVE BIO SURGEON STRL SZ7.5 (GLOVE) ×2 IMPLANT
GLOVE BIO SURGEON STRL SZ8 (GLOVE) ×1 IMPLANT
GLOVE BIOGEL PI IND STRL 6.5 (GLOVE) IMPLANT
GLOVE BIOGEL PI INDICATOR 6.5 (GLOVE) ×2
GOWN STRL REUS W/ TWL LRG LVL3 (GOWN DISPOSABLE) ×3 IMPLANT
GOWN STRL REUS W/TWL LRG LVL3 (GOWN DISPOSABLE) ×6
GRAFT GORETEX STRT 4-7X45 (Vascular Products) ×1 IMPLANT
HEMOSTAT SPONGE AVITENE ULTRA (HEMOSTASIS) ×1 IMPLANT
KIT BASIN OR (CUSTOM PROCEDURE TRAY) ×2 IMPLANT
KIT ROOM TURNOVER OR (KITS) ×2 IMPLANT
LOOP VESSEL MINI RED (MISCELLANEOUS) ×1 IMPLANT
NS IRRIG 1000ML POUR BTL (IV SOLUTION) ×2 IMPLANT
PACK CV ACCESS (CUSTOM PROCEDURE TRAY) ×2 IMPLANT
PAD ARMBOARD 7.5X6 YLW CONV (MISCELLANEOUS) ×4 IMPLANT
SPONGE SURGIFOAM ABS GEL 100 (HEMOSTASIS) IMPLANT
SUT PROLENE 6 0 BV (SUTURE) ×6 IMPLANT
SUT PROLENE 7 0 BV 1 (SUTURE) ×2 IMPLANT
SUT VIC AB 3-0 SH 27 (SUTURE) ×4
SUT VIC AB 3-0 SH 27X BRD (SUTURE) ×1 IMPLANT
SUT VIC AB 4-0 PS2 18 (SUTURE) ×1 IMPLANT
SUT VICRYL 4-0 PS2 18IN ABS (SUTURE) ×2 IMPLANT
UNDERPAD 30X30 (UNDERPADS AND DIAPERS) ×2 IMPLANT
WATER STERILE IRR 1000ML POUR (IV SOLUTION) ×2 IMPLANT

## 2017-02-13 NOTE — H&P (View-Only) (Signed)
Referring Physician: Dr Arty Baumgartner  Patient name: [name] MRN: [id number] DOB: 1945-12-07 Sex: female  REASON FOR CONSULT: Hemodialysis access, CKD 5  HPI: [name] is a 71 y.o. 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 She has not had any prior access procedures. She is right-handed. She denies any numbness or tingling in her hands. Other medical problems include anxiety, depression, diabetes, hypertension hyperlipidemia all of which are currently stable.  Past Medical History:  Diagnosis Date  . Anxiety   . Arthritis    lower back and knees   . Asthma    childhood  . Barrett's esophagus   . Depression   . Diabetes mellitus   . Dysphagia   . Eczema   . History of herpes zoster 02/2010   Recovered fully after period of acute herpetic neuralgia tx w/ gabapentin.   Marland Kitchen Hx MRSA infection 2009  . Hyperlipidemia   . Hypertension   . Neuromuscular disorder (HCC)    chronic pain  . Personal history of colonic polyps 10/17/2010   hyperplastic   Past Surgical History:  Procedure Laterality Date  . ABDOMINAL HYSTERECTOMY     unclear when  . CATARACT EXTRACTION Bilateral   . thigh surg     left side due to MRSA    Family History  Problem Relation Age of Onset  . Hypertension Father   . Kidney disease Father   . Diabetes Brother   . Colon cancer Neg Hx   . CAD Neg Hx     SOCIAL HISTORY: Social History   Social History  . Marital status: Divorced    Spouse name: N/A  . Number of children: 0  . Years of education: N/A   Occupational History  . Retired [employer]   Social History Main Topics  . Smoking status: Current Every Day Smoker    Packs/day: 1.00    Years: 40.00    Types: Cigarettes  . Smokeless tobacco: Never Used     Comment: smokes 1 pack of cigerettes a week- off and on  . Alcohol use 1.2 oz/week    2 Standard drinks or equivalent per week     Comment: wine and beer- occasionally   . Drug use: No  . Sexual  activity: No   Other Topics Concern  . Not on file   Social History Narrative   Financial assistance approved for 100% discount at [facility] and has [facility] card per Bonna Gains   02/10/2010      3 caffeine drinks daily     Allergies  Allergen Reactions  . Lisinopril Other (See Comments)    Abnormal Kidney Function   . Morphine And Related Nausea And Vomiting  . Pioglitazone Other (See Comments)    REACTION: Desquamation of skin of the palm  . Sulfonamide Derivatives Rash    Current Outpatient Prescriptions  Medication Sig Dispense Refill  . ALPRAZolam (XANAX) 0.5 MG tablet TAKE 1 TABLET BY MOUTH TWICE DAILY IF NEEDED FOR ANXIETY 60 tablet 2  . amLODipine (NORVASC) 10 MG tablet Take 1 tablet (10 mg total) by mouth daily. 30 tablet 6  . aspirin EC 325 MG tablet Take 1 tablet (325 mg total) by mouth daily. 30 tablet 6  . atenolol (TENORMIN) 25 MG tablet Take 1 tablet (25 mg total) by mouth daily. 30 tablet 6  . Cholecalciferol (VITAMIN D3) 5000 units TABS Take 1 tablet (5,000 Units total) by mouth daily. 30 tablet 6  .  cloNIDine (CATAPRES) 0.2 MG tablet Take 1 tablet (0.2 mg total) by mouth 2 (two) times daily. 60 tablet 6  . clopidogrel (PLAVIX) 75 MG tablet Take 1 tablet (75 mg total) by mouth daily. 30 tablet 6  . cromolyn (OPTICROM) 4 % ophthalmic solution Place 1 drop into both eyes 2 (two) times daily.   0  . ezetimibe (ZETIA) 10 MG tablet Take 1 tablet (10 mg total) by mouth daily. 30 tablet 6  . latanoprost (XALATAN) 0.005 % ophthalmic solution Place 1 drop into both eyes at bedtime. Reported on 08/27/2015    . Melatonin 3 MG TABS 6 mg by mouth as needed for sleep.  0  . PARoxetine (PAXIL) 40 MG tablet Take 1 tablet (40 mg total) by mouth daily. 30 tablet 6  . pravastatin (PRAVACHOL) 40 MG tablet Take 1 tablet (40 mg total) by mouth at bedtime. 30 tablet 6  . SUMAtriptan (IMITREX) 25 MG tablet 1 tablet daily as needed for headache, May repeat in 2 hours if headache persists or  recurs. Max of 2 tablets in 24 hours 30 tablet 0  . triamcinolone cream (KENALOG) 0.1 % Apply 1 application topically 3 (three) times daily as needed (rash).   0   No current facility-administered medications for this visit.     ROS:   General:  No weight loss, Fever, chills  HEENT: No recent headaches, no nasal bleeding, no visual changes, no sore throat  Neurologic: No dizziness, blackouts, seizures. No recent symptoms of stroke or mini- stroke. No recent episodes of slurred speech, or temporary blindness.  Cardiac: No recent episodes of chest pain/pressure, no shortness of breath at rest.  No shortness of breath with exertion.  Denies history of atrial fibrillation or irregular heartbeat  Vascular: No history of rest pain in feet.  No history of claudication.  No history of non-healing ulcer, No history of DVT   Pulmonary: No home oxygen, no productive cough, no hemoptysis,  No asthma or wheezing  Musculoskeletal:  [ ]  Arthritis, [ ]  Low back pain,  [ ]  Joint pain  Hematologic:No history of hypercoagulable state.  No history of easy bleeding.  No history of anemia  Gastrointestinal: No hematochezia or melena,  No gastroesophageal reflux, no trouble swallowing  Urinary: [X]  chronic Kidney disease, [ ]  on HD - [ ]  MWF or [ ]  TTHS, [ ]  Burning with urination, [ ]  Frequent urination, [ ]  Difficulty urinating;   Skin: No rashes  Psychological: No history of anxiety,  No history of depression   Physical Examination  Vitals:   02/01/17 1130  BP: 92/69  Pulse: 60  Resp: 16  Temp: (!) 97.1 F (36.2 C)  TempSrc: Oral  SpO2: 97%  Weight: 163 lb (73.9 kg)  Height: 5\' 5"  (1.651 m)    Body mass index is 27.12 kg/m.  General:  Alert and oriented, no acute distress HEENT: Normal Neck: No bruit or JVD Pulmonary: Clear to auscultation bilaterally Cardiac: Regular Rate and Rhythm without murmur Abdomen: Soft, non-tender, non-distended, no mass Skin: No rash Extremity Pulses:   2+ radial, brachial pulses bilaterally Musculoskeletal: No deformity or edema  Neurologic: Upper and lower extremity motor 5/5 and symmetric  DATA:  Patient had a vein mapping ultrasound as well as an upper extremity arterial duplex today which I reviewed and interpreted. The cephalic vein was small in the forearm bilaterally. The radial artery was also small in the forearm bilaterally. The brachial artery was 3-3-1/2 mm. The cephalic vein and  basilic veins in the upper arms were both less than 3 mm.  ASSESSMENT:  Patient is CKD 5 and needs long-term hemodialysis access. She has small poor quality veins bilaterally. I discussed with her the possibility of placement of a left brachiocephalic AV fistula but also discussed with her that she may require graft. Risks benefits possible, indications and procedure details including but not limited to bleeding infection ischemic steal graft thrombosis requirement for other future procedures non-maturation of fistula were all discussed with the patient today. She understands and agrees to proceed.   PLAN: Left brachiocephalic AV fistula versus AV graft 02/13/2017   Ruta Hinds, MD [facility] [place] Office: [phone] Pager: [phone]

## 2017-02-13 NOTE — Anesthesia Procedure Notes (Signed)
Procedure Name: MAC Date/Time: 02/13/2017 7:40 AM Performed by: Candis Shine Pre-anesthesia Checklist: Patient identified, Emergency Drugs available, Suction available, Patient being monitored and Timeout performed Patient Re-evaluated:Patient Re-evaluated prior to induction Oxygen Delivery Method: Simple face mask Dental Injury: Teeth and Oropharynx as per pre-operative assessment

## 2017-02-13 NOTE — Anesthesia Procedure Notes (Signed)
Procedure Name: LMA Insertion Date/Time: 02/13/2017 7:52 AM Performed by: Candis Shine Pre-anesthesia Checklist: Patient identified, Emergency Drugs available, Suction available and Patient being monitored Patient Re-evaluated:Patient Re-evaluated prior to induction Oxygen Delivery Method: Circle System Utilized Preoxygenation: Pre-oxygenation with 100% oxygen Induction Type: IV induction Ventilation: Mask ventilation without difficulty LMA: LMA inserted LMA Size: 4.0 Number of attempts: 1 Placement Confirmation: positive ETCO2 Tube secured with: Tape Dental Injury: Teeth and Oropharynx as per pre-operative assessment

## 2017-02-13 NOTE — Interval H&P Note (Signed)
History and Physical Interval Note:  02/13/2017 7:30 AM  [name]  has presented today for surgery, with the diagnosis of chronic kidney disease  The various methods of treatment have been discussed with the patient and family. After consideration of risks, benefits and other options for treatment, the patient has consented to  Procedure(s): ARTERIOVENOUS (AV) FISTULA CREATION VERSUS GRAFT INSERTION LEFT ARM (Left) as a surgical intervention .  The patient's history has been reviewed, patient examined, no change in status, stable for surgery.  I have reviewed the patient's chart and labs.  Questions were answered to the patient's satisfaction.     Ruta Hinds

## 2017-02-13 NOTE — Telephone Encounter (Signed)
-----   Message from Mena Goes, RN sent at 02/13/2017 10:13 AM EDT ----- Regarding: 2-3 weeks    ----- Message ----- From: Natividad Brood Sent: 02/13/2017   9:42 AM To: Vvs Charge Pool  S/p LUA AVG.  F/u with Dr. Oneida Alar in 2-3 weeks.  Thanks!

## 2017-02-13 NOTE — Anesthesia Postprocedure Evaluation (Signed)
Anesthesia Post Note  Patient: [name]  Procedure(s) Performed: ARTERIOVENOUS (AV) GORE-TEX STRETCH GRAFT INSERTION INTO LEFT ARM (Left Arm Upper)     Patient location during evaluation: PACU Anesthesia Type: General Level of consciousness: sedated Pain management: pain level controlled Vital Signs Assessment: post-procedure vital signs reviewed and stable Respiratory status: spontaneous breathing and respiratory function stable Cardiovascular status: stable Postop Assessment: no apparent nausea or vomiting Anesthetic complications: no    Last Vitals:  Vitals:   02/13/17 1045 02/13/17 1104  BP:  (!) 150/81  Pulse: 80 80  Resp: (!) 25 20  Temp:    SpO2: 96% 98%    Last Pain:  Vitals:   02/13/17 0546  TempSrc: Oral                 Wynema Garoutte DANIEL

## 2017-02-13 NOTE — Transfer of Care (Signed)
Immediate Anesthesia Transfer of Care Note  Patient: [name]  Procedure(s) Performed: ARTERIOVENOUS (AV) GORE-TEX STRETCH GRAFT INSERTION INTO LEFT ARM (Left Arm Upper)  Patient Location: PACU  Anesthesia Type:General  Level of Consciousness: awake and alert   Airway & Oxygen Therapy: Patient Spontanous Breathing and Patient connected to nasal cannula oxygen  Post-op Assessment: Report given to RN and Post -op Vital signs reviewed and stable  Post vital signs: Reviewed and stable  Last Vitals:  Vitals:   02/13/17 0546  BP: (!) 170/86  Pulse: (!) 55  Temp: (!) 36.4 C  SpO2: 100%    Last Pain:  Vitals:   02/13/17 0546  TempSrc: Oral         Complications: No apparent anesthesia complications

## 2017-02-13 NOTE — Telephone Encounter (Signed)
Sched appt 03/01/17 at 3:00. Pt's # has no vm, mailed appt letter through regular mail.

## 2017-02-13 NOTE — Op Note (Signed)
Procedure: Left Upper Arm AV graft  Preop: ESRD  Postop: ESRD  Asst: Samantha Rhyne PA-C  Anesthesia: General  Findings:4-7 mm PTFE end to side to deep brachial vein   Procedure Details: The left upper extremity was prepped and draped in usual sterile fashion.  A longitudinal incision was then made near the antecubital crease the left arm.  There were no suitable antecubital veins for outflow.   The incision was carried into the subcutaneous tissues down to level of the brachial artery.  Next the brachial artery was dissected free in the medial portion incision. The artery was  3 mm in diameter. The vessel loops were placed proximal and distal to the planned site of arteriotomy. At this point, a longitudinal incision was made in the axilla and carried through the subcutaneous tissues and fascia to expose the axillary vein.  The nerves were protected.  The vein was approximately 3 mm in diameter.  There were 3 veins total and each was about the same diameter. Next, a subcutaneous tunnel was created connecting the upper arm to the lower arm incision in an arcing configuration over the biceps muscle.  A 4-7 mm PTFE graft was then brought through this subcutaneous tunnel. The patient was given 5000 units of intravenous heparin. After appropriate circulation time, the vessel loops were used to control the artery. A longitudinal opening was made in the right brachial artery.  The 4 mm end of the graft was beveled and sewn end to side to the artery using a 6 0 prolene.  At completion of the anastomosis the artery was forward bled, backbled and thoroughly flushed.  The anastomosis was secured, vessel loops were released and there was palpable pulse in the graft.  The graft was clamped just above the arterial anastomosis with a fistula clamp. The graft was then pulled taut to length at the axillary incision.  The axillary vein was controlled with a fine bulldog clamp in the upper axilla.  The vein was opened  longitudinally.  The distal end of the graft was then beveled and sewn end to side to the vein using a running 6 0 prolene.  Just prior to completion of the anastomosis, everything was forward bled, back bled and thoroughly flushed.  The anastomosis was secured and the fistula clamp removed from the proximal graft.  A thrill was immediately palpable in the graft. The patient was given 50 mg of protamine to assist with hemostasis as well as avitene.  After hemostasis was obtained, the subcutaneous tissues were reapproximated using a running 3-0 Vicryl suture. The skin was then closed with a 4 0 Vicryl subcuticular stitch. Dermabond was applied to the skin incisions.  The patient tolerated the procedure well and there were no complications.  Instrument sponge and needle count was correct at the end of the case.  The patient was taken to the recovery room in stable condition. The patient had a palpable radial pulse at the end of the case.  Ruta Hinds, MD [facility] [place] Office: [phone] Pager: [phone]

## 2017-02-13 NOTE — Discharge Instructions (Signed)
° °  [facility]  Discharge Instructions  AV Fistula or Graft Surgery for Dialysis Access  Please refer to the following instructions for your post-procedure care. Your surgeon or physician assistant will discuss any changes with you.  Activity  You may drive the day following your surgery, if you are comfortable and no longer taking prescription pain medication. Resume full activity as the soreness in your incision resolves.  Bathing/Showering  You may shower after you go home. Keep your incision dry for 48 hours. Do not soak in a bathtub, hot tub, or swim until the incision heals completely. You may not shower if you have a hemodialysis catheter.  Incision Care  Clean your incision with mild soap and water after 48 hours. Pat the area dry with a clean towel. You do not need a bandage unless otherwise instructed. Do not apply any ointments or creams to your incision. You may have skin glue on your incision. Do not peel it off. It will come off on its own in about one week. Your arm may swell a bit after surgery. To reduce swelling use pillows to elevate your arm so it is above your heart. Your doctor will tell you if you need to lightly wrap your arm with an ACE bandage.  Diet  Resume your normal diet. There are not special food restrictions following this procedure. In order to heal from your surgery, it is CRITICAL to get adequate nutrition. Your body requires vitamins, minerals, and protein. Vegetables are the best source of vitamins and minerals. Vegetables also provide the perfect balance of protein. Processed food has little nutritional value, so try to avoid this.  Medications  Resume taking all of your medications. If your incision is causing pain, you may take over-the counter pain relievers such as acetaminophen (Tylenol). If you were prescribed a stronger pain medication, please be aware these medications can cause nausea and constipation. Prevent  nausea by taking the medication with a snack or meal. Avoid constipation by drinking plenty of fluids and eating foods with high amount of fiber, such as fruits, vegetables, and grains. Do not take Tylenol if you are taking prescription pain medications.  Follow up Your surgeon may want to see you in the office following your access surgery. If so, this will be arranged at the time of your surgery.  Please call us immediately for any of the following conditions:  Increased pain, redness, drainage (pus) from your incision site Fever of 101 degrees or higher Severe or worsening pain at your incision site Hand pain or numbness.  Reduce your risk of vascular disease:  Stop smoking. If you would like help, call [facility] at 1-800-QUIT-NOW (450)712-4548) or [place] [facility] [place] your cholesterol Maintain a desired weight Control your diabetes Keep your blood pressure down  Dialysis  It will take several weeks to several months for your new dialysis access to be ready for use. Your surgeon will determine when it is OK to use it. Your nephrologist will continue to direct your dialysis. You can continue to use your Permcath until your new access is ready for use.   02/13/2017 [name] [id number] 08-Jul-1945  Surgeon(s): Fields, Jessy Oto, MD  Procedure(s): ARTERIOVENOUS (AV) GORE-TEX STRETCH GRAFT INSERTION INTO LEFT ARM  x Do not stick graft for 4 weeks    If you have any questions, please call the office at [phone].

## 2017-02-14 ENCOUNTER — Encounter (HOSPITAL_COMMUNITY): Payer: Self-pay | Admitting: Vascular Surgery

## 2017-02-19 ENCOUNTER — Other Ambulatory Visit: Payer: Self-pay | Admitting: *Deleted

## 2017-02-19 DIAGNOSIS — I639 Cerebral infarction, unspecified: Secondary | ICD-10-CM

## 2017-02-19 MED ORDER — PRAVASTATIN SODIUM 40 MG PO TABS: 40.0000 mg | ORAL_TABLET | Freq: Every day | ORAL | 1 refills | Status: DC

## 2017-02-19 NOTE — Telephone Encounter (Signed)
[facility] [employer]

## 2017-02-21 ENCOUNTER — Encounter (HOSPITAL_COMMUNITY): Payer: Self-pay

## 2017-02-21 ENCOUNTER — Other Ambulatory Visit (HOSPITAL_COMMUNITY): Payer: Self-pay

## 2017-02-21 ENCOUNTER — Encounter: Payer: Self-pay | Admitting: Vascular Surgery

## 2017-03-01 ENCOUNTER — Ambulatory Visit (INDEPENDENT_AMBULATORY_CARE_PROVIDER_SITE_OTHER): Payer: Self-pay | Admitting: Vascular Surgery

## 2017-03-01 ENCOUNTER — Encounter: Payer: Self-pay | Admitting: Vascular Surgery

## 2017-03-01 VITALS — BP 123/77 | HR 57 | Temp 97.1°F | Resp 22 | Ht 65.0 in | Wt 161.0 lb

## 2017-03-01 DIAGNOSIS — N184 Chronic kidney disease, stage 4 (severe): Secondary | ICD-10-CM

## 2017-03-01 NOTE — Progress Notes (Signed)
Patient is a 71 year old female who returns today for follow-up after recent placement of a left upper arm AV graft. She denies any weakness in her left hand. She does have a lot of numbness and tingling in the first 2 digits of the left hand.  Physical exam:  Vitals:   03/01/17 1428  BP: 123/77  Pulse: (!) 57  Resp: (!) 22  Temp: (!) 97.1 F (36.2 C)  TempSrc: Oral  SpO2: 100%  Weight: 161 lb (73 kg)  Height: 5\' 5"  (1.651 m)    Left upper extremity: Well-healed antecubital and axillary incision, palpable thrill audible bruit and graft  Assessment: Patent left upper arm AV graft. It should be ready for cannulation in 2 weeks. The patient will follow-up with me on an as-needed basis.  Ruta Hinds, MD [facility] [place] Office: [phone] Pager: [phone]

## 2017-03-05 DIAGNOSIS — N2581 Secondary hyperparathyroidism of renal origin: Secondary | ICD-10-CM | POA: Diagnosis not present

## 2017-03-05 DIAGNOSIS — I120 Hypertensive chronic kidney disease with stage 5 chronic kidney disease or end stage renal disease: Secondary | ICD-10-CM | POA: Diagnosis not present

## 2017-03-05 DIAGNOSIS — N179 Acute kidney failure, unspecified: Secondary | ICD-10-CM | POA: Diagnosis not present

## 2017-03-05 DIAGNOSIS — N185 Chronic kidney disease, stage 5: Secondary | ICD-10-CM | POA: Diagnosis not present

## 2017-03-05 DIAGNOSIS — E785 Hyperlipidemia, unspecified: Secondary | ICD-10-CM | POA: Diagnosis not present

## 2017-03-05 DIAGNOSIS — W19XXXA Unspecified fall, initial encounter: Secondary | ICD-10-CM | POA: Diagnosis not present

## 2017-03-05 DIAGNOSIS — E1122 Type 2 diabetes mellitus with diabetic chronic kidney disease: Secondary | ICD-10-CM | POA: Diagnosis not present

## 2017-03-05 DIAGNOSIS — Z992 Dependence on renal dialysis: Secondary | ICD-10-CM | POA: Diagnosis not present

## 2017-03-05 DIAGNOSIS — D631 Anemia in chronic kidney disease: Secondary | ICD-10-CM | POA: Diagnosis not present

## 2017-03-06 ENCOUNTER — Other Ambulatory Visit: Payer: Self-pay | Admitting: *Deleted

## 2017-03-06 DIAGNOSIS — N185 Chronic kidney disease, stage 5: Secondary | ICD-10-CM | POA: Diagnosis not present

## 2017-03-06 DIAGNOSIS — F418 Other specified anxiety disorders: Secondary | ICD-10-CM

## 2017-03-06 MED ORDER — ALPRAZOLAM 0.5 MG PO TABS: ORAL_TABLET | ORAL | 1 refills | Status: DC

## 2017-03-06 NOTE — Telephone Encounter (Signed)
[facility] [employer]

## 2017-03-08 ENCOUNTER — Other Ambulatory Visit: Payer: Self-pay | Admitting: Nurse Practitioner

## 2017-03-08 DIAGNOSIS — F418 Other specified anxiety disorders: Secondary | ICD-10-CM

## 2017-03-08 DIAGNOSIS — G43009 Migraine without aura, not intractable, without status migrainosus: Secondary | ICD-10-CM

## 2017-03-09 ENCOUNTER — Other Ambulatory Visit: Payer: Self-pay

## 2017-03-09 ENCOUNTER — Encounter (HOSPITAL_COMMUNITY)
Admission: RE | Admit: 2017-03-09 | Discharge: 2017-03-09 | Disposition: A | Discharge status: Home / Self Care | Payer: Medicare HMO | Source: Ambulatory Visit | Attending: Nephrology | Admitting: Nephrology

## 2017-03-09 ENCOUNTER — Emergency Department (HOSPITAL_COMMUNITY): Payer: Medicare HMO

## 2017-03-09 ENCOUNTER — Encounter (HOSPITAL_COMMUNITY): Payer: Self-pay | Admitting: Emergency Medicine

## 2017-03-09 VITALS — BP 129/59 | HR 57

## 2017-03-09 DIAGNOSIS — J189 Pneumonia, unspecified organism: Secondary | ICD-10-CM | POA: Diagnosis present

## 2017-03-09 DIAGNOSIS — Z79891 Long term (current) use of opiate analgesic: Secondary | ICD-10-CM

## 2017-03-09 DIAGNOSIS — I129 Hypertensive chronic kidney disease with stage 1 through stage 4 chronic kidney disease, or unspecified chronic kidney disease: Secondary | ICD-10-CM | POA: Diagnosis present

## 2017-03-09 DIAGNOSIS — Z885 Allergy status to narcotic agent status: Secondary | ICD-10-CM

## 2017-03-09 DIAGNOSIS — Z8249 Family history of ischemic heart disease and other diseases of the circulatory system: Secondary | ICD-10-CM

## 2017-03-09 DIAGNOSIS — F419 Anxiety disorder, unspecified: Secondary | ICD-10-CM | POA: Diagnosis present

## 2017-03-09 DIAGNOSIS — Z8614 Personal history of Methicillin resistant Staphylococcus aureus infection: Secondary | ICD-10-CM | POA: Diagnosis not present

## 2017-03-09 DIAGNOSIS — I120 Hypertensive chronic kidney disease with stage 5 chronic kidney disease or end stage renal disease: Secondary | ICD-10-CM | POA: Diagnosis not present

## 2017-03-09 DIAGNOSIS — Z888 Allergy status to other drugs, medicaments and biological substances status: Secondary | ICD-10-CM

## 2017-03-09 DIAGNOSIS — Z86010 Personal history of colonic polyps: Secondary | ICD-10-CM

## 2017-03-09 DIAGNOSIS — W010XXA Fall on same level from slipping, tripping and stumbling without subsequent striking against object, initial encounter: Secondary | ICD-10-CM

## 2017-03-09 DIAGNOSIS — Z8673 Personal history of transient ischemic attack (TIA), and cerebral infarction without residual deficits: Secondary | ICD-10-CM

## 2017-03-09 DIAGNOSIS — R269 Unspecified abnormalities of gait and mobility: Secondary | ICD-10-CM | POA: Diagnosis not present

## 2017-03-09 DIAGNOSIS — K219 Gastro-esophageal reflux disease without esophagitis: Secondary | ICD-10-CM | POA: Diagnosis present

## 2017-03-09 DIAGNOSIS — M25561 Pain in right knee: Secondary | ICD-10-CM | POA: Diagnosis not present

## 2017-03-09 DIAGNOSIS — E782 Mixed hyperlipidemia: Secondary | ICD-10-CM | POA: Diagnosis not present

## 2017-03-09 DIAGNOSIS — N185 Chronic kidney disease, stage 5: Secondary | ICD-10-CM | POA: Insufficient documentation

## 2017-03-09 DIAGNOSIS — S2232XA Fracture of one rib, left side, initial encounter for closed fracture: Secondary | ICD-10-CM | POA: Diagnosis present

## 2017-03-09 DIAGNOSIS — Z720 Tobacco use: Secondary | ICD-10-CM | POA: Diagnosis not present

## 2017-03-09 DIAGNOSIS — E872 Acidosis: Secondary | ICD-10-CM | POA: Diagnosis not present

## 2017-03-09 DIAGNOSIS — G8929 Other chronic pain: Secondary | ICD-10-CM | POA: Diagnosis present

## 2017-03-09 DIAGNOSIS — D631 Anemia in chronic kidney disease: Secondary | ICD-10-CM | POA: Diagnosis not present

## 2017-03-09 DIAGNOSIS — F17210 Nicotine dependence, cigarettes, uncomplicated: Secondary | ICD-10-CM | POA: Diagnosis present

## 2017-03-09 DIAGNOSIS — R0902 Hypoxemia: Secondary | ICD-10-CM | POA: Diagnosis not present

## 2017-03-09 DIAGNOSIS — D500 Iron deficiency anemia secondary to blood loss (chronic): Secondary | ICD-10-CM | POA: Diagnosis present

## 2017-03-09 DIAGNOSIS — J9601 Acute respiratory failure with hypoxia: Secondary | ICD-10-CM | POA: Diagnosis present

## 2017-03-09 DIAGNOSIS — Z79899 Other long term (current) drug therapy: Secondary | ICD-10-CM

## 2017-03-09 DIAGNOSIS — F329 Major depressive disorder, single episode, unspecified: Secondary | ICD-10-CM | POA: Diagnosis present

## 2017-03-09 DIAGNOSIS — Z7982 Long term (current) use of aspirin: Secondary | ICD-10-CM | POA: Diagnosis not present

## 2017-03-09 DIAGNOSIS — Y93K1 Activity, walking an animal: Secondary | ICD-10-CM | POA: Diagnosis not present

## 2017-03-09 DIAGNOSIS — E8770 Fluid overload, unspecified: Secondary | ICD-10-CM | POA: Diagnosis not present

## 2017-03-09 DIAGNOSIS — E785 Hyperlipidemia, unspecified: Secondary | ICD-10-CM | POA: Diagnosis present

## 2017-03-09 DIAGNOSIS — E1149 Type 2 diabetes mellitus with other diabetic neurological complication: Secondary | ICD-10-CM | POA: Diagnosis not present

## 2017-03-09 DIAGNOSIS — J90 Pleural effusion, not elsewhere classified: Secondary | ICD-10-CM | POA: Diagnosis not present

## 2017-03-09 DIAGNOSIS — G4700 Insomnia, unspecified: Secondary | ICD-10-CM | POA: Diagnosis present

## 2017-03-09 DIAGNOSIS — Z794 Long term (current) use of insulin: Secondary | ICD-10-CM | POA: Diagnosis not present

## 2017-03-09 DIAGNOSIS — R0603 Acute respiratory distress: Secondary | ICD-10-CM | POA: Diagnosis not present

## 2017-03-09 DIAGNOSIS — E1122 Type 2 diabetes mellitus with diabetic chronic kidney disease: Secondary | ICD-10-CM | POA: Diagnosis present

## 2017-03-09 DIAGNOSIS — R34 Anuria and oliguria: Secondary | ICD-10-CM | POA: Diagnosis not present

## 2017-03-09 DIAGNOSIS — Z7902 Long term (current) use of antithrombotics/antiplatelets: Secondary | ICD-10-CM | POA: Diagnosis not present

## 2017-03-09 DIAGNOSIS — J181 Lobar pneumonia, unspecified organism: Principal | ICD-10-CM | POA: Diagnosis present

## 2017-03-09 DIAGNOSIS — I639 Cerebral infarction, unspecified: Secondary | ICD-10-CM | POA: Diagnosis not present

## 2017-03-09 DIAGNOSIS — D638 Anemia in other chronic diseases classified elsewhere: Secondary | ICD-10-CM | POA: Diagnosis present

## 2017-03-09 DIAGNOSIS — Z882 Allergy status to sulfonamides status: Secondary | ICD-10-CM

## 2017-03-09 DIAGNOSIS — R0602 Shortness of breath: Secondary | ICD-10-CM | POA: Diagnosis not present

## 2017-03-09 DIAGNOSIS — Z841 Family history of disorders of kidney and ureter: Secondary | ICD-10-CM

## 2017-03-09 DIAGNOSIS — I10 Essential (primary) hypertension: Secondary | ICD-10-CM | POA: Diagnosis not present

## 2017-03-09 LAB — IRON AND TIBC
Iron: 46 ug/dL (ref 28–170)
SATURATION RATIOS: 23 % (ref 10.4–31.8)
TIBC: 200 ug/dL — AB (ref 250–450)
UIBC: 154 ug/dL

## 2017-03-09 LAB — CBC WITH DIFFERENTIAL/PLATELET
BASOS PCT: 0 %
Basophils Absolute: 0 10*3/uL (ref 0.0–0.1)
EOS ABS: 1.1 10*3/uL — AB (ref 0.0–0.7)
EOS PCT: 10 %
HCT: 26.3 % — ABNORMAL LOW (ref 36.0–46.0)
HEMOGLOBIN: 8.4 g/dL — AB (ref 12.0–15.0)
Lymphocytes Relative: 29 %
Lymphs Abs: 3.2 10*3/uL (ref 0.7–4.0)
MCH: 28.8 pg (ref 26.0–34.0)
MCHC: 31.9 g/dL (ref 30.0–36.0)
MCV: 90.1 fL (ref 78.0–100.0)
Monocytes Absolute: 0.8 10*3/uL (ref 0.1–1.0)
Monocytes Relative: 7 %
NEUTROS PCT: 54 %
Neutro Abs: 5.9 10*3/uL (ref 1.7–7.7)
PLATELETS: 249 10*3/uL (ref 150–400)
RBC: 2.92 MIL/uL — AB (ref 3.87–5.11)
RDW: 16 % — ABNORMAL HIGH (ref 11.5–15.5)
WBC: 11 10*3/uL — AB (ref 4.0–10.5)

## 2017-03-09 LAB — RENAL FUNCTION PANEL
Albumin: 3 g/dL — ABNORMAL LOW (ref 3.5–5.0)
Anion gap: 8 (ref 5–15)
BUN: 55 mg/dL — AB (ref 6–20)
CHLORIDE: 117 mmol/L — AB (ref 101–111)
CO2: 16 mmol/L — AB (ref 22–32)
CREATININE: 6.5 mg/dL — AB (ref 0.44–1.00)
Calcium: 8 mg/dL — ABNORMAL LOW (ref 8.9–10.3)
GFR calc Af Amer: 7 mL/min — ABNORMAL LOW (ref >60)
GFR, EST NON AFRICAN AMERICAN: 6 mL/min — AB (ref >60)
GLUCOSE: 120 mg/dL — AB (ref 65–99)
Phosphorus: 5.4 mg/dL — ABNORMAL HIGH (ref 2.5–4.6)
Potassium: 5.2 mmol/L — ABNORMAL HIGH (ref 3.5–5.1)
Sodium: 141 mmol/L (ref 135–145)

## 2017-03-09 LAB — COMPREHENSIVE METABOLIC PANEL
ALK PHOS: 93 U/L (ref 38–126)
ALT: 19 U/L (ref 14–54)
AST: 19 U/L (ref 15–41)
Albumin: 3.1 g/dL — ABNORMAL LOW (ref 3.5–5.0)
Anion gap: 8 (ref 5–15)
BILIRUBIN TOTAL: 0.7 mg/dL (ref 0.3–1.2)
BUN: 55 mg/dL — AB (ref 6–20)
CALCIUM: 7.8 mg/dL — AB (ref 8.9–10.3)
CO2: 16 mmol/L — ABNORMAL LOW (ref 22–32)
Chloride: 119 mmol/L — ABNORMAL HIGH (ref 101–111)
Creatinine, Ser: 6.49 mg/dL — ABNORMAL HIGH (ref 0.44–1.00)
GFR, EST AFRICAN AMERICAN: 7 mL/min — AB (ref >60)
GFR, EST NON AFRICAN AMERICAN: 6 mL/min — AB (ref >60)
Glucose, Bld: 103 mg/dL — ABNORMAL HIGH (ref 65–99)
Potassium: 5.1 mmol/L (ref 3.5–5.1)
Sodium: 143 mmol/L (ref 135–145)
TOTAL PROTEIN: 6 g/dL — AB (ref 6.5–8.1)

## 2017-03-09 LAB — I-STAT CG4 LACTIC ACID, ED: LACTIC ACID, VENOUS: 1.28 mmol/L (ref 0.5–1.9)

## 2017-03-09 LAB — FERRITIN: FERRITIN: 292 ng/mL (ref 11–307)

## 2017-03-09 MED ORDER — ALBUTEROL SULFATE (2.5 MG/3ML) 0.083% IN NEBU
5.0000 mg | INHALATION_SOLUTION | Freq: Once | RESPIRATORY_TRACT | Status: AC
  Administered 2017-03-09: 5 mg via RESPIRATORY_TRACT
  Filled 2017-03-09: qty 6

## 2017-03-09 MED ORDER — DARBEPOETIN ALFA 60 MCG/0.3ML IJ SOSY
60.0000 ug | PREFILLED_SYRINGE | INTRAMUSCULAR | Status: DC
  Administered 2017-03-09: 60 ug via SUBCUTANEOUS

## 2017-03-09 MED ORDER — DARBEPOETIN ALFA 60 MCG/0.3ML IJ SOSY
PREFILLED_SYRINGE | INTRAMUSCULAR | Status: AC
  Administered 2017-03-09: 60 ug via SUBCUTANEOUS
  Filled 2017-03-09: qty 0.3

## 2017-03-09 NOTE — ED Triage Notes (Signed)
Patient arrived with complaint of feeling bad. Audible wheezing from bedside. Patient denies history of similar and is unsure of why she feels so bad. Difficulty speaking in full sentences. Initial O2 saturation was 85 on RA.

## 2017-03-10 ENCOUNTER — Other Ambulatory Visit: Payer: Self-pay

## 2017-03-10 ENCOUNTER — Encounter (HOSPITAL_COMMUNITY): Payer: Self-pay | Admitting: Physician Assistant

## 2017-03-10 ENCOUNTER — Inpatient Hospital Stay (HOSPITAL_COMMUNITY)
Admission: EM | Admit: 2017-03-10 | Discharge: 2017-03-16 | DRG: 193 | Disposition: A | Discharge status: Home Health | Payer: Medicare HMO | Attending: Internal Medicine | Admitting: Internal Medicine

## 2017-03-10 DIAGNOSIS — F419 Anxiety disorder, unspecified: Secondary | ICD-10-CM | POA: Diagnosis present

## 2017-03-10 DIAGNOSIS — N185 Chronic kidney disease, stage 5: Secondary | ICD-10-CM

## 2017-03-10 DIAGNOSIS — E782 Mixed hyperlipidemia: Secondary | ICD-10-CM

## 2017-03-10 DIAGNOSIS — E1149 Type 2 diabetes mellitus with other diabetic neurological complication: Secondary | ICD-10-CM

## 2017-03-10 DIAGNOSIS — I10 Essential (primary) hypertension: Secondary | ICD-10-CM

## 2017-03-10 DIAGNOSIS — F329 Major depressive disorder, single episode, unspecified: Secondary | ICD-10-CM | POA: Diagnosis present

## 2017-03-10 DIAGNOSIS — J189 Pneumonia, unspecified organism: Secondary | ICD-10-CM

## 2017-03-10 DIAGNOSIS — F32A Depression, unspecified: Secondary | ICD-10-CM | POA: Diagnosis present

## 2017-03-10 DIAGNOSIS — G8929 Other chronic pain: Secondary | ICD-10-CM | POA: Diagnosis present

## 2017-03-10 DIAGNOSIS — D500 Iron deficiency anemia secondary to blood loss (chronic): Secondary | ICD-10-CM | POA: Diagnosis present

## 2017-03-10 DIAGNOSIS — R0600 Dyspnea, unspecified: Secondary | ICD-10-CM

## 2017-03-10 DIAGNOSIS — I639 Cerebral infarction, unspecified: Secondary | ICD-10-CM | POA: Diagnosis present

## 2017-03-10 DIAGNOSIS — K219 Gastro-esophageal reflux disease without esophagitis: Secondary | ICD-10-CM | POA: Diagnosis present

## 2017-03-10 DIAGNOSIS — J181 Lobar pneumonia, unspecified organism: Secondary | ICD-10-CM

## 2017-03-10 DIAGNOSIS — Z794 Long term (current) use of insulin: Secondary | ICD-10-CM

## 2017-03-10 DIAGNOSIS — Z720 Tobacco use: Secondary | ICD-10-CM | POA: Diagnosis present

## 2017-03-10 DIAGNOSIS — E785 Hyperlipidemia, unspecified: Secondary | ICD-10-CM | POA: Diagnosis present

## 2017-03-10 DIAGNOSIS — F17200 Nicotine dependence, unspecified, uncomplicated: Secondary | ICD-10-CM | POA: Diagnosis present

## 2017-03-10 DIAGNOSIS — M25569 Pain in unspecified knee: Secondary | ICD-10-CM

## 2017-03-10 DIAGNOSIS — G4700 Insomnia, unspecified: Secondary | ICD-10-CM | POA: Diagnosis present

## 2017-03-10 DIAGNOSIS — R0902 Hypoxemia: Secondary | ICD-10-CM

## 2017-03-10 HISTORY — DX: Chronic kidney disease, unspecified: N18.9

## 2017-03-10 HISTORY — DX: Anemia, unspecified: D64.9

## 2017-03-10 LAB — LACTIC ACID, PLASMA
LACTIC ACID, VENOUS: 1.2 mmol/L (ref 0.5–1.9)
Lactic Acid, Venous: 1.2 mmol/L (ref 0.5–1.9)

## 2017-03-10 LAB — GLUCOSE, CAPILLARY
GLUCOSE-CAPILLARY: 87 mg/dL (ref 65–99)
GLUCOSE-CAPILLARY: 101 mg/dL — AB (ref 65–99)
Glucose-Capillary: 104 mg/dL — ABNORMAL HIGH (ref 65–99)

## 2017-03-10 LAB — PROCALCITONIN: Procalcitonin: 0.1 ng/mL

## 2017-03-10 LAB — PTH, INTACT AND CALCIUM
CALCIUM TOTAL (PTH): 7.9 mg/dL — AB (ref 8.7–10.3)
PTH: 415 pg/mL — AB (ref 15–65)

## 2017-03-10 LAB — HEMOGLOBIN A1C
Hgb A1c MFr Bld: 5.7 % — ABNORMAL HIGH (ref 4.8–5.6)
Mean Plasma Glucose: 116.89 mg/dL

## 2017-03-10 MED ORDER — ALBUTEROL SULFATE (2.5 MG/3ML) 0.083% IN NEBU: 5.0000 mg | INHALATION_SOLUTION | RESPIRATORY_TRACT | Status: DC | PRN

## 2017-03-10 MED ORDER — INSULIN ASPART 100 UNIT/ML ~~LOC~~ SOLN
0.0000 [IU] | Freq: Three times a day (TID) | SUBCUTANEOUS | Status: DC
  Administered 2017-03-12 – 2017-03-15 (×4): 1 [IU] via SUBCUTANEOUS
  Administered 2017-03-15: 2 [IU] via SUBCUTANEOUS
  Administered 2017-03-16: 1 [IU] via SUBCUTANEOUS

## 2017-03-10 MED ORDER — ALPRAZOLAM 0.5 MG PO TABS
0.5000 mg | ORAL_TABLET | Freq: Two times a day (BID) | ORAL | Status: DC | PRN
Start: 2017-03-10 — End: 2017-03-16

## 2017-03-10 MED ORDER — PAROXETINE HCL 20 MG PO TABS
40.0000 mg | ORAL_TABLET | Freq: Every day | ORAL | Status: DC
Start: 2017-03-10 — End: 2017-03-10

## 2017-03-10 MED ORDER — LEVOFLOXACIN IN D5W 500 MG/100ML IV SOLN
500.0000 mg | INTRAVENOUS | Status: DC
  Administered 2017-03-12: 500 mg via INTRAVENOUS
  Filled 2017-03-10: qty 100

## 2017-03-10 MED ORDER — ASPIRIN EC 325 MG PO TBEC
325.0000 mg | DELAYED_RELEASE_TABLET | Freq: Every day | ORAL | Status: DC
  Administered 2017-03-10 – 2017-03-16 (×7): 325 mg via ORAL
  Filled 2017-03-10 (×7): qty 1

## 2017-03-10 MED ORDER — CLOPIDOGREL BISULFATE 75 MG PO TABS
75.0000 mg | ORAL_TABLET | Freq: Every day | ORAL | Status: DC
  Administered 2017-03-10 – 2017-03-16 (×7): 75 mg via ORAL
  Filled 2017-03-10 (×7): qty 1

## 2017-03-10 MED ORDER — EZETIMIBE 10 MG PO TABS
10.0000 mg | ORAL_TABLET | Freq: Every day | ORAL | Status: DC
  Administered 2017-03-10 – 2017-03-16 (×7): 10 mg via ORAL
  Filled 2017-03-10 (×7): qty 1

## 2017-03-10 MED ORDER — ATENOLOL 25 MG PO TABS
25.0000 mg | ORAL_TABLET | Freq: Every day | ORAL | Status: DC
  Administered 2017-03-10 – 2017-03-16 (×7): 25 mg via ORAL
  Filled 2017-03-10 (×7): qty 1

## 2017-03-10 MED ORDER — MELATONIN 3 MG PO TABS: 6.0000 mg | ORAL_TABLET | Freq: Every evening | ORAL | Status: DC | PRN

## 2017-03-10 MED ORDER — PANTOPRAZOLE SODIUM 40 MG PO TBEC
40.0000 mg | DELAYED_RELEASE_TABLET | Freq: Every day | ORAL | Status: DC
  Administered 2017-03-10 – 2017-03-16 (×7): 40 mg via ORAL
  Filled 2017-03-10 (×7): qty 1

## 2017-03-10 MED ORDER — BISACODYL 5 MG PO TBEC
5.0000 mg | DELAYED_RELEASE_TABLET | Freq: Every day | ORAL | Status: DC | PRN
  Administered 2017-03-11: 5 mg via ORAL
  Filled 2017-03-10: qty 1

## 2017-03-10 MED ORDER — GUAIFENESIN ER 600 MG PO TB12: 600.0000 mg | ORAL_TABLET | Freq: Two times a day (BID) | ORAL | Status: DC | PRN

## 2017-03-10 MED ORDER — PRAVASTATIN SODIUM 40 MG PO TABS
40.0000 mg | ORAL_TABLET | Freq: Every day | ORAL | Status: DC
  Administered 2017-03-10 – 2017-03-15 (×6): 40 mg via ORAL
  Filled 2017-03-10 (×6): qty 1

## 2017-03-10 MED ORDER — KETOROLAC TROMETHAMINE 15 MG/ML IJ SOLN: 15.0000 mg | Freq: Four times a day (QID) | INTRAMUSCULAR | Status: AC | PRN

## 2017-03-10 MED ORDER — SUMATRIPTAN SUCCINATE 25 MG PO TABS: 25.0000 mg | ORAL_TABLET | Freq: Every day | ORAL | Status: DC | PRN

## 2017-03-10 MED ORDER — AMLODIPINE BESYLATE 10 MG PO TABS
10.0000 mg | ORAL_TABLET | Freq: Every day | ORAL | Status: DC
  Administered 2017-03-10 – 2017-03-16 (×7): 10 mg via ORAL
  Filled 2017-03-10 (×7): qty 1

## 2017-03-10 MED ORDER — PAROXETINE HCL 20 MG PO TABS
40.0000 mg | ORAL_TABLET | Freq: Every day | ORAL | Status: DC
  Administered 2017-03-10 – 2017-03-16 (×7): 40 mg via ORAL
  Filled 2017-03-10 (×7): qty 2

## 2017-03-10 MED ORDER — HEPARIN SODIUM (PORCINE) 5000 UNIT/ML IJ SOLN
5000.0000 [IU] | Freq: Three times a day (TID) | INTRAMUSCULAR | Status: DC
  Administered 2017-03-10 – 2017-03-16 (×19): 5000 [IU] via SUBCUTANEOUS
  Filled 2017-03-10 (×15): qty 1

## 2017-03-10 MED ORDER — CLONIDINE HCL 0.2 MG PO TABS
0.2000 mg | ORAL_TABLET | Freq: Two times a day (BID) | ORAL | Status: DC
  Administered 2017-03-10 – 2017-03-16 (×13): 0.2 mg via ORAL
  Filled 2017-03-10 (×13): qty 1

## 2017-03-10 MED ORDER — VITAMIN D 1000 UNITS PO TABS
5000.0000 [IU] | ORAL_TABLET | Freq: Every day | ORAL | Status: DC
  Administered 2017-03-10 – 2017-03-16 (×7): 5000 [IU] via ORAL
  Filled 2017-03-10 (×7): qty 5

## 2017-03-10 MED ORDER — IPRATROPIUM-ALBUTEROL 0.5-2.5 (3) MG/3ML IN SOLN
3.0000 mL | Freq: Four times a day (QID) | RESPIRATORY_TRACT | Status: DC | PRN
  Administered 2017-03-10: 3 mL via RESPIRATORY_TRACT
  Filled 2017-03-10: qty 3

## 2017-03-10 MED ORDER — LATANOPROST 0.005 % OP SOLN
1.0000 [drp] | Freq: Every day | OPHTHALMIC | Status: DC
  Administered 2017-03-10 – 2017-03-15 (×6): 1 [drp] via OPHTHALMIC
  Filled 2017-03-10: qty 2.5

## 2017-03-10 MED ORDER — LEVOFLOXACIN IN D5W 500 MG/100ML IV SOLN
500.0000 mg | Freq: Once | INTRAVENOUS | Status: AC
Start: 2017-03-10 — End: 2017-03-10
  Administered 2017-03-10: 500 mg via INTRAVENOUS
  Filled 2017-03-10: qty 100

## 2017-03-10 NOTE — H&P (Signed)
History and Physical    [name] XBM:[id number] DOB: 01/07/1946 DOA: 03/10/2017   PCP: [name], NP   Patient coming from:  Home    Chief Complaint: Shortness of breath  HPI: [name] is a 71 y.o. female with medical history significant for hypertension, hyperlipidemia, diabetes, CKD stage V, not yet on dialysis, presenting to the ED with increasing shortness of breath, and nonproductive cough.  This was preceded by a fall around November 7, landing on the left side, causing left-sided rib pain since that time, and hurting her when taking a deep breath.  At the time, did not hit her head or lose consciousness.  She denies any chest pain or palpitations.  She denies rhinorrhea or hemoptysis.  Denies subjective fever or chills.  She denies any sick contacts, or recent long distance travel.  Denies any abdominal pain. Has decreased appetite due to current symptoms.patient still making urine.  She denies any dysuria or gross hematuria.  Denies lower extremity swelling. No confusion was reported. Denies any vision changes, double vision or headaches.   ED Course:  BP (!) 145/70   Pulse (!) 58   Temp 97.7 F (36.5 C) (Oral)   Resp 17   Ht 5\' 5"  (1.651 m)   Wt 72.6 kg (160 lb)   SpO2 98%   BMI 26.63 kg/m   At the triage, the patient was hypoxic, given 2 L of oxygen, with improvement of her respiratory status.  Of note, the patient does not wear oxygen chronically. Chest x-ray was consistent with right lower lobe pneumonia. Levaquin IV was started. Potassium 5.1, creatinine 6.49 Lactic acid 1.28. White count 11 Hemoglobin 8.4, MCV 90.1, platelets 249. Last 2D echo in August 2018 showed EF 02-72%, normal systolic, grade 1 diastolic.  Review of Systems: As per HPI otherwise 10 point review of systems negative.   Past Medical History:  Diagnosis Date  . Anemia   . Anxiety   . Arthritis    lower back and knees   . Asthma    childhood  . Barrett's esophagus   .  Chronic kidney disease   . Depression   . Diabetes mellitus   . Dysphagia   . Eczema   . History of herpes zoster 02/2010   Recovered fully after period of acute herpetic neuralgia tx w/ gabapentin.   Marland Kitchen Hx MRSA infection 2009  . Hyperlipidemia   . Hypertension   . Neuromuscular disorder (HCC)    chronic pain  . Personal history of colonic polyps 10/17/2010   hyperplastic    Past Surgical History:  Procedure Laterality Date  . ABDOMINAL HYSTERECTOMY     unclear when  . ARTERIOVENOUS (AV) GORE-TEX STRETCH GRAFT INSERTION INTO LEFT ARM Left 02/13/2017   Performed by Elam Dutch, MD at [facility]  . CATARACT EXTRACTION Bilateral   . thigh surg     left side due to MRSA    Social History Social History   Socioeconomic History  . Marital status: Divorced    Spouse name: Not on file  . Number of children: 0  . Years of education: Not on file  . Highest education level: Not on file  Social Needs  . Financial resource strain: Not on file  . Food insecurity - worry: Not on file  . Food insecurity - inability: Not on file  . Transportation needs - medical: Not on file  . Transportation needs - non-medical: Not on file  Occupational History  .  Occupation: Retired    Fish farm manager: SELF-EMPLOYED  Tobacco Use  . Smoking status: Current Every Day Smoker    Packs/day: 1.00    Years: 40.00    Pack years: 40.00    Types: Cigarettes  . Smokeless tobacco: Never Used  . Tobacco comment: smokes 1 pack of cigerettes a week- off and on  Substance and Sexual Activity  . Alcohol use: Yes    Alcohol/week: 1.2 oz    Types: 2 Standard drinks or equivalent per week    Comment: wine and beer- occasionally   . Drug use: No  . Sexual activity: No  Other Topics Concern  . Not on file  Social History Narrative   Financial assistance approved for 100% discount at [facility] and has [facility] card per Bonna Gains   02/10/2010      3 caffeine drinks daily      Allergies  Allergen Reactions  .  Lisinopril Other (See Comments)    Abnormal Kidney Function   . Pioglitazone Other (See Comments)    REACTION: Desquamation of skin of the palm  . Morphine And Related Nausea And Vomiting  . Sulfonamide Derivatives Rash    Family History  Problem Relation Age of Onset  . Hypertension Father   . Kidney disease Father   . Diabetes Brother   . Colon cancer Neg Hx   . CAD Neg Hx       Prior to Admission medications   Medication Sig Start Date End Date Taking? Authorizing Provider  ALPRAZolam Duanne Moron) 0.5 MG tablet Take one tablet by mouth twice daily as needed for anxiety 03/06/17  Yes [name], NP  amLODipine (NORVASC) 10 MG tablet Take 1 tablet (10 mg total) by mouth daily. 01/02/17  Yes [name], NP  aspirin EC 325 MG tablet Take 1 tablet (325 mg total) by mouth daily. 01/02/17  Yes [name], NP  atenolol (TENORMIN) 25 MG tablet Take 1 tablet (25 mg total) by mouth daily. 01/02/17  Yes [name], NP  Cholecalciferol (VITAMIN D3) 5000 units TABS Take 1 tablet (5,000 Units total) by mouth daily. 01/02/17  Yes [name], NP  cloNIDine (CATAPRES) 0.2 MG tablet Take 1 tablet (0.2 mg total) by mouth 2 (two) times daily. 01/02/17  Yes [name], NP  clopidogrel (PLAVIX) 75 MG tablet Take 1 tablet (75 mg total) by mouth daily. 01/02/17  Yes [name], NP  ezetimibe (ZETIA) 10 MG tablet Take 1 tablet (10 mg total) by mouth daily. 01/02/17 01/02/18 Yes [name], NP  latanoprost (XALATAN) 0.005 % ophthalmic solution Place 1 drop into both eyes at bedtime. Reported on 08/27/2015 04/23/12  Yes [provider]  Melatonin 3 MG TABS 6 mg by mouth as needed for sleep. Patient taking differently: Take 6 mg by mouth at bedtime as needed (sleep).  01/02/17  Yes [name], NP  omeprazole (PRILOSEC) 40 MG capsule Take 40 mg by mouth daily. 01/22/17  Yes [provider]  oxyCODONE-acetaminophen (ROXICET) 5-325 MG tablet  Take 1 tablet by mouth every 6 (six) hours as needed for severe pain. 02/13/17  Yes Rhyne, Hulen Shouts, PA-C  PARoxetine (PAXIL) 40 MG tablet Take 1 tablet (40 mg total) by mouth daily. 01/02/17  Yes [name], NP  pravastatin (PRAVACHOL) 40 MG tablet Take 1 tablet (40 mg total) by mouth at bedtime. 02/19/17  Yes [name], NP  SUMAtriptan (IMITREX) 25 MG tablet 1 tablet daily as  needed for headache, May repeat in 2 hours if headache persists or recurs. Max of 2 tablets in 24 hours 01/02/17  Yes Eubanks, Carlos American, NP  triamcinolone cream (KENALOG) 0.1 % Apply 1 application topically 3 (three) times daily as needed (rash).  04/14/15  Yes [provider]    Physical Exam:  Vitals:   03/09/17 2353 03/10/17 0241 03/10/17 0521 03/10/17 0530  BP: (!) 143/65 (!) 142/63 (!) 156/75 (!) 145/70  Pulse: 68 72 64 (!) 58  Resp: 18 18 (!) 24 17  Temp:   97.7 F (36.5 C)   TempSrc:   Oral   SpO2: 98% 98% 97% 98%  Weight:      Height:       Constitutional: NAD, calm, uncomfortable due to respiratory symptoms, as well as left rib pain. Eyes: PERRL, lids and conjunctivae normal ENMT: Mucous membranes are moist, without exudate or lesions poor dentition, with several missing teeth Neck: normal, supple, no masses, no thyromegaly Respiratory: Decreased breath sounds on the right lower lobe, decreased respiratory effort due to pain in the left lower ribs, no wheezing or rhonchi.  No rails. Cardiovascular: Regular rate and rhythm, no murmurs, rubs or gallops. No extremity edema. 2+ pedal pulses. No carotid bruits.  Abdomen: Soft, non tender, No hepatosplenomegaly. Bowel sounds positive.  Musculoskeletal: no clubbing / cyanosis. Moves all extremities.  Tender to palpation in the left lower ribs. Skin: no jaundice, No lesions.  Neurologic: Sensation intact  Strength equal in all extremities Psychiatric:   Alert and oriented x 3. Normal mood.     Labs on Admission: I have personally  reviewed following labs and imaging studies  CBC: Recent Labs  Lab 03/09/17 2219  WBC 11.0*  NEUTROABS 5.9  HGB 8.4*  HCT 26.3*  MCV 90.1  PLT 703    Basic Metabolic Panel: Recent Labs  Lab 03/09/17 0857 03/09/17 2219  NA 141 143  K 5.2* 5.1  CL 117* 119*  CO2 16* 16*  GLUCOSE 120* 103*  BUN 55* 55*  CREATININE 6.50* 6.49*  CALCIUM 8.0*  7.9* 7.8*  PHOS 5.4*  --     GFR: Estimated Creatinine Clearance: 7.9 mL/min (A) (by C-G formula based on SCr of 6.49 mg/dL (H)).  Liver Function Tests: Recent Labs  Lab 03/09/17 0857 03/09/17 2219  AST  --  19  ALT  --  19  ALKPHOS  --  93  BILITOT  --  0.7  PROT  --  6.0*  ALBUMIN 3.0* 3.1*   No results for input(s): LIPASE, AMYLASE in the last 168 hours. No results for input(s): AMMONIA in the last 168 hours.  Coagulation Profile: No results for input(s): INR, PROTIME in the last 168 hours.  Cardiac Enzymes: No results for input(s): CKTOTAL, CKMB, CKMBINDEX, TROPONINI in the last 168 hours.  BNP (last 3 results) No results for input(s): PROBNP in the last 8760 hours.  HbA1C: No results for input(s): HGBA1C in the last 72 hours.  CBG: No results for input(s): GLUCAP in the last 168 hours.  Lipid Profile: No results for input(s): CHOL, HDL, LDLCALC, TRIG, CHOLHDL, LDLDIRECT in the last 72 hours.  Thyroid Function Tests: No results for input(s): TSH, T4TOTAL, FREET4, T3FREE, THYROIDAB in the last 72 hours.  Anemia Panel: Recent Labs    03/09/17 0857  FERRITIN 292  TIBC 200*  IRON 46    Urine analysis:    Component Value Date/Time   COLORURINE YELLOW 11/21/2012 [place] 11/21/2012  1438   LABSPEC 1.027 11/21/2012 1438   PHURINE 5.0 11/21/2012 1438   GLUCOSEU NEG 11/21/2012 1438   HGBUR NEG 11/21/2012 1438   BILIRUBINUR SMALL (A) 11/21/2012 1438   KETONESUR TRACE (A) 11/21/2012 1438   PROTEINUR > 300 (A) 11/21/2012 1438   UROBILINOGEN 0.2 11/21/2012 1438   NITRITE NEG 11/21/2012  1438   LEUKOCYTESUR MOD (A) 11/21/2012 1438    Sepsis Labs: @LABRCNTIP (procalcitonin:4,lacticidven:4) )No results found for this or any previous visit (from the past 240 hour(s)).   Radiological Exams on Admission: Dg Chest 2 View  Result Date: 03/09/2017 CLINICAL DATA:  71 year old female with shortness of breath. EXAM: CHEST  2 VIEW COMPARISON:  Chest radiograph dated 12/02/2016 FINDINGS: There is a small right and probable trace left pleural effusions. Patchy area of density in the right lower and middle lobes is concerning for pneumonia. Clinical correlation is recommended. There is no pneumothorax. Stable top-normal cardiac silhouette. Osteopenia with degenerative changes of the spine. Mild old lower thoracic compression deformities. No acute osseous pathology. Atherosclerotic calcification of the aorta noted. IMPRESSION: Consolidative changes of the right lung most consistent with pneumonia. Clinical correlation and follow-up recommended. Small bilateral pleural effusions. Electronically Signed   By: Anner Crete M.D.   On: 03/09/2017 23:03    EKG: Independently reviewed.  Assessment/Plan Active Problems:   Hyperlipidemia   Depression   Essential hypertension   INSOMNIA UNSPECIFIED   GERD (gastroesophageal reflux disease) with acute nausea    DM (diabetes mellitus) type II controlled, neurological manifestation (HCC)   Tobacco abuse   Iron deficiency anemia due to chronic blood loss   Anxiety   Chronic knee pain   CKD (chronic kidney disease) stage 5, GFR less than 15 ml/min (HCC)   CVA (cerebral vascular accident) ([place])   Pneumonia .  Acute Respiratory Failure with Hypoxia likely due to  CAP Presenting now with ongoing / progressive symptoms including none productive cough,At the triage, the patient was hypoxic, given 2 L of oxygen, with improvement of her respiratory status.  Of note, the patient does not wear oxygen chronically.  CXR today suggests right lower lobe and  right middle lobe infiltrate .  Also seen, small bilateral pleural effusions. WBC   Lactic acid 1.28 bicarb 16.  She is in no acute distress.  Received Levaquin IV, and albuterol nebulizer with some improvement of her symptoms  Admit to observation telemetry Oxygen   sputum cultures  IV Levaquin per pharmacy  Procalcitonin, lactic acid  Nebulizers as needed, with Duoneb q 6 prn  and Albuterol q 2 h prn for wheezing  Mucinex prn  Repeat CBC in am CXR in am   Left rib pain after fall 1 month ago, neg for fracture per X ray  Toradol every 6 hours as needed PT-OT Continue to monitor    Hypertension BP  145/70   Pulse  58  Continue home anti-hypertensive medications including atenolol, Norvasc  Hyperlipidemia Continue home Pravachol     Acute on Chronic kidney disease stage   baseline creatinine   6, current Cr 6.49, s/p R AVG, to start HD on 11/24 at [facility]  .K 5.1   Lab Results  Component Value Date   CREATININE 6.49 (H) 03/09/2017   CREATININE 6.50 (H) 03/09/2017   CREATININE 7.83 (H) 01/16/2017  Repeat CMET in am   Anemia of chronic disease Hemoglobin on admission n 8.4, no bleeding issues noted.  Baseline between 9 and 9.5.  The patient receives Aranesp as outpatient  on a regular basis.  Last anemia panel drawn on 03/09/2017, showing iron 46, with ferritin 292 suggesting chronic disease.  TIBC 200. Repeat CBC in am  No transfusion is indicated at this time  Type II Diabetes Current blood sugar level is 110  Lab Results  Component Value Date   HGBA1C 5.2 12/03/2016  Hgb A1C SSI  Depression  Continue Paxil   GERD, no acute symptoms Continue PPI  History of CVA, no acute issues  continue Plavix and ASA   Insomnia Continue Melatonin   DVT prophylaxis:  Heparin  Code Status:   Full   Family Communication:  Discussed with patient Disposition Plan: Expect patient to be discharged to home after condition improves Consults called:    None Admission  status:Tele obs   Sharene Butters, PA-C [facility]   03/10/2017, 7:29 AM

## 2017-03-10 NOTE — Progress Notes (Signed)
Pharmacy Antibiotic Note  [name] is a 71 y.o. female admitted on 03/10/2017 with pneumonia.  Pharmacy has been consulted for Levaquin dosing. WBC elevated at 11. Afebrile. Patient has a h/o CKD not yet on dialysis. Still making urine. SCr on admission is elevated at 6.49. Patient has already received a dose of Levaquin 500 mg IV in the ED.  Plan: -Start Levaquin 500 mg IV Q 48 hours on 11/19 -Monitor CBC, renal fx, cultures and clinical progress  Height: 5\' 5"  (165.1 cm) Weight: 160 lb (72.6 kg) IBW/kg (Calculated) : 57  Temp (24hrs), Avg:97.7 F (36.5 C), Min:97.7 F (36.5 C), Max:97.7 F (36.5 C)  Recent Labs  Lab 03/09/17 0857 03/09/17 2219 03/09/17 2231  WBC  --  11.0*  --   CREATININE 6.50* 6.49*  --   LATICACIDVEN  --   --  1.28    Estimated Creatinine Clearance: 7.9 mL/min (A) (by C-G formula based on SCr of 6.49 mg/dL (H)).    Allergies  Allergen Reactions  . Lisinopril Other (See Comments)    Abnormal Kidney Function   . Pioglitazone Other (See Comments)    REACTION: Desquamation of skin of the palm  . Morphine And Related Nausea And Vomiting  . Sulfonamide Derivatives Rash    Antimicrobials this admission: Levaquin 11/17 >>   Dose adjustments this admission: None   Microbiology results: 11/17 BCx:    Thank you for allowing pharmacy to be a part of this patient's care.  Albertina Parr, PharmD., BCPS Clinical Pharmacist Pager [phone]

## 2017-03-10 NOTE — Progress Notes (Signed)
PT Cancellation Note  Patient Details Name: [name] MRN: [id number] DOB: 12/09/1945   Cancelled Treatment:    Reason Eval/Treat Not Completed: Medical issues which prohibited therapy. Pt very SOB upon arrival with RN present. Pt's SPO2 fluctuating at 79-82%. Pt's RN paged RT. PT will continue to f/u with pt as available and appropriate.    [place] 03/10/2017, 3:28 PM

## 2017-03-10 NOTE — ED Notes (Signed)
Pt ambulatory to restroom; respirations labored after ambulating, sats 97% on room air

## 2017-03-10 NOTE — ED Provider Notes (Signed)
TIME SEEN: 5:27 AM  CHIEF COMPLAINT: Shortness of breath and cough  HPI: Patient is a 71 year old female with history of hypertension, hyperlipidemia, diabetes, chronic kidney disease not yet on dialysis who presents to the emergency department with SOB and cough.  Reports that she tripped and fell walking her dog on November 7 and landed on her left side and has had some left-sided rib pain since that time.  States it hurts to take a deep breath.  She did not hit her head or lose consciousness.  No neck or back pain.  States today shortness of breath became worse.  No fever.  Was hypoxic on room air in triage.  Does not wear oxygen chronically.  Has not yet on dialysis.  Still making urine.  ROS: See HPI Constitutional: no fever  Eyes: no drainage  ENT: no runny nose   Cardiovascular: Left-sided chest wall pain Resp:  SOB  GI: no vomiting GU: no dysuria Integumentary: no rash  Allergy: no hives  Musculoskeletal: no leg swelling  Neurological: no slurred speech ROS otherwise negative  PAST MEDICAL HISTORY/PAST SURGICAL HISTORY:  Past Medical History:  Diagnosis Date  . Anxiety   . Arthritis    lower back and knees   . Asthma    childhood  . Barrett's esophagus   . Depression   . Diabetes mellitus   . Dysphagia   . Eczema   . History of herpes zoster 02/2010   Recovered fully after period of acute herpetic neuralgia tx w/ gabapentin.   Marland Kitchen Hx MRSA infection 2009  . Hyperlipidemia   . Hypertension   . Neuromuscular disorder (HCC)    chronic pain  . Personal history of colonic polyps 10/17/2010   hyperplastic    MEDICATIONS:  Prior to Admission medications   Medication Sig Start Date End Date Taking? Authorizing Provider  ALPRAZolam Duanne Moron) 0.5 MG tablet Take one tablet by mouth twice daily as needed for anxiety 03/06/17   [name], NP  amLODipine (NORVASC) 10 MG tablet Take 1 tablet (10 mg total) by mouth daily. 01/02/17   [name], NP  aspirin EC 325 MG  tablet Take 1 tablet (325 mg total) by mouth daily. 01/02/17   [name], NP  atenolol (TENORMIN) 25 MG tablet Take 1 tablet (25 mg total) by mouth daily. 01/02/17   [name], NP  Cholecalciferol (VITAMIN D3) 5000 units TABS Take 1 tablet (5,000 Units total) by mouth daily. 01/02/17   [name], NP  cloNIDine (CATAPRES) 0.2 MG tablet Take 1 tablet (0.2 mg total) by mouth 2 (two) times daily. 01/02/17   [name], NP  clopidogrel (PLAVIX) 75 MG tablet Take 1 tablet (75 mg total) by mouth daily. 01/02/17   [name], NP  ezetimibe (ZETIA) 10 MG tablet Take 1 tablet (10 mg total) by mouth daily. 01/02/17 01/02/18  [name], NP  latanoprost (XALATAN) 0.005 % ophthalmic solution Place 1 drop into both eyes at bedtime. Reported on 08/27/2015 04/23/12   [provider]  Melatonin 3 MG TABS 6 mg by mouth as needed for sleep. Patient taking differently: Take 6 mg by mouth at bedtime as needed (sleep).  01/02/17   [name], NP  omeprazole (PRILOSEC) 40 MG capsule Take 40 mg by mouth daily. 01/22/17   [provider]  oxyCODONE-acetaminophen (ROXICET) 5-325 MG tablet Take 1 tablet by mouth every 6 (six) hours as needed for severe pain. 02/13/17   Rhyne,  Hulen Shouts, PA-C  PARoxetine (PAXIL) 40 MG tablet Take 1 tablet (40 mg total) by mouth daily. 01/02/17   [name], NP  pravastatin (PRAVACHOL) 40 MG tablet Take 1 tablet (40 mg total) by mouth at bedtime. 02/19/17   [name], NP  SUMAtriptan (IMITREX) 25 MG tablet 1 tablet daily as needed for headache, May repeat in 2 hours if headache persists or recurs. Max of 2 tablets in 24 hours 01/02/17   [name], NP  triamcinolone cream (KENALOG) 0.1 % Apply 1 application topically 3 (three) times daily as needed (rash).  04/14/15   [provider]    ALLERGIES:  Allergies  Allergen Reactions  . Lisinopril Other (See Comments)    Abnormal Kidney Function    . Pioglitazone Other (See Comments)    REACTION: Desquamation of skin of the palm  . Morphine And Related Nausea And Vomiting  . Sulfonamide Derivatives Rash    SOCIAL HISTORY:  Social History   Tobacco Use  . Smoking status: Current Every Day Smoker    Packs/day: 1.00    Years: 40.00    Pack years: 40.00    Types: Cigarettes  . Smokeless tobacco: Never Used  . Tobacco comment: smokes 1 pack of cigerettes a week- off and on  Substance Use Topics  . Alcohol use: Yes    Alcohol/week: 1.2 oz    Types: 2 Standard drinks or equivalent per week    Comment: wine and beer- occasionally     FAMILY HISTORY: Family History  Problem Relation Age of Onset  . Hypertension Father   . Kidney disease Father   . Diabetes Brother   . Colon cancer Neg Hx   . CAD Neg Hx     EXAM: BP (!) 156/75 (BP Location: Right Arm)   Pulse 64   Temp 97.7 F (36.5 C) (Oral)   Resp (!) 24   Ht 5\' 5"  (1.651 m)   Wt 72.6 kg (160 lb)   SpO2 97%   BMI 26.63 kg/m  CONSTITUTIONAL: Alert and oriented and responds appropriately to questions.  Elderly, afebrile, no distress HEAD: Normocephalic, atraumatic EYES: Conjunctivae clear, pupils appear equal, EOMI ENT: normal nose; moist mucous membranes NECK: Supple, no meningismus, no nuchal rigidity, no LAD; no midline spinal tenderness or step-off or deformity CARD: RRR; S1 and S2 appreciated; no murmurs, no clicks, no rubs, no gallops CHEST:  Chest wall is tender to palpation over the lateral left chest wall.  No crepitus, ecchymosis, erythema, warmth, rash or other lesions present.   RESP: Normal chest excursion without splinting or tachypnea; breath sounds clear and equal bilaterally; no wheezes, no rhonchi, no rales, no hypoxia or respiratory distress, speaking full sentences ABD/GI: Normal bowel sounds; non-distended; soft, non-tender, no rebound, no guarding, no peritoneal signs, no hepatosplenomegaly BACK:  The back appears normal and is non-tender to  palpation, there is no CVA tenderness, no midline spinal tenderness or step-off or deformity EXT: Normal ROM in all joints; non-tender to palpation; no edema; normal capillary refill; no cyanosis, no calf tenderness or swelling    SKIN: Normal color for age and race; warm; no rash NEURO: Moves all extremities equally PSYCH: The patient's mood and manner are appropriate. Grooming and personal hygiene are appropriate.  MEDICAL DECISION MAKING: Patient here with chest pain after a fall.  No rib fracture seen on x-ray.  Having worsening shortness of breath and cough.  Hypoxic in triage.  No hypoxia currently.  Chest x-ray shows  small bilateral pleural effusions and right-sided pneumonia.  Will give IV antibiotics obtain blood cultures.  I feel she will need admission.  She does not appear septic.  Lactate is normal.  Blood pressure normal.  PCP - Dewaine Oats.  ED PROGRESS: 5:50 AM Discussed patient's case with hospitalist, Dr. Hal Hope.  I have recommended admission and patient (and family if present) agree with this plan. Admitting physician will place admission orders.   I reviewed all nursing notes, vitals, pertinent previous records, EKGs, lab and urine results, imaging (as available).       Koral Thaden, Delice Bison, DO 03/10/17 [phone]

## 2017-03-11 ENCOUNTER — Other Ambulatory Visit: Payer: Self-pay

## 2017-03-11 ENCOUNTER — Observation Stay (HOSPITAL_COMMUNITY): Payer: Medicare HMO

## 2017-03-11 DIAGNOSIS — I639 Cerebral infarction, unspecified: Secondary | ICD-10-CM | POA: Diagnosis not present

## 2017-03-11 DIAGNOSIS — Z8614 Personal history of Methicillin resistant Staphylococcus aureus infection: Secondary | ICD-10-CM | POA: Diagnosis not present

## 2017-03-11 DIAGNOSIS — E785 Hyperlipidemia, unspecified: Secondary | ICD-10-CM | POA: Diagnosis present

## 2017-03-11 DIAGNOSIS — E782 Mixed hyperlipidemia: Secondary | ICD-10-CM | POA: Diagnosis not present

## 2017-03-11 DIAGNOSIS — Z79891 Long term (current) use of opiate analgesic: Secondary | ICD-10-CM | POA: Diagnosis not present

## 2017-03-11 DIAGNOSIS — J181 Lobar pneumonia, unspecified organism: Secondary | ICD-10-CM | POA: Diagnosis present

## 2017-03-11 DIAGNOSIS — J9601 Acute respiratory failure with hypoxia: Secondary | ICD-10-CM | POA: Diagnosis present

## 2017-03-11 DIAGNOSIS — I10 Essential (primary) hypertension: Secondary | ICD-10-CM | POA: Diagnosis not present

## 2017-03-11 DIAGNOSIS — S2232XA Fracture of one rib, left side, initial encounter for closed fracture: Secondary | ICD-10-CM | POA: Diagnosis present

## 2017-03-11 DIAGNOSIS — Z720 Tobacco use: Secondary | ICD-10-CM | POA: Diagnosis not present

## 2017-03-11 DIAGNOSIS — D638 Anemia in other chronic diseases classified elsewhere: Secondary | ICD-10-CM | POA: Diagnosis present

## 2017-03-11 DIAGNOSIS — F329 Major depressive disorder, single episode, unspecified: Secondary | ICD-10-CM | POA: Diagnosis present

## 2017-03-11 DIAGNOSIS — E1149 Type 2 diabetes mellitus with other diabetic neurological complication: Secondary | ICD-10-CM | POA: Diagnosis not present

## 2017-03-11 DIAGNOSIS — E872 Acidosis: Secondary | ICD-10-CM | POA: Diagnosis not present

## 2017-03-11 DIAGNOSIS — G8929 Other chronic pain: Secondary | ICD-10-CM | POA: Diagnosis present

## 2017-03-11 DIAGNOSIS — M25561 Pain in right knee: Secondary | ICD-10-CM | POA: Diagnosis not present

## 2017-03-11 DIAGNOSIS — Z7982 Long term (current) use of aspirin: Secondary | ICD-10-CM | POA: Diagnosis not present

## 2017-03-11 DIAGNOSIS — K219 Gastro-esophageal reflux disease without esophagitis: Secondary | ICD-10-CM | POA: Diagnosis present

## 2017-03-11 DIAGNOSIS — J189 Pneumonia, unspecified organism: Secondary | ICD-10-CM | POA: Diagnosis present

## 2017-03-11 DIAGNOSIS — Z882 Allergy status to sulfonamides status: Secondary | ICD-10-CM | POA: Diagnosis not present

## 2017-03-11 DIAGNOSIS — Z79899 Other long term (current) drug therapy: Secondary | ICD-10-CM | POA: Diagnosis not present

## 2017-03-11 DIAGNOSIS — Z885 Allergy status to narcotic agent status: Secondary | ICD-10-CM | POA: Diagnosis not present

## 2017-03-11 DIAGNOSIS — Z7902 Long term (current) use of antithrombotics/antiplatelets: Secondary | ICD-10-CM | POA: Diagnosis not present

## 2017-03-11 DIAGNOSIS — R0902 Hypoxemia: Secondary | ICD-10-CM | POA: Diagnosis not present

## 2017-03-11 DIAGNOSIS — Y93K1 Activity, walking an animal: Secondary | ICD-10-CM | POA: Diagnosis not present

## 2017-03-11 DIAGNOSIS — R34 Anuria and oliguria: Secondary | ICD-10-CM | POA: Diagnosis not present

## 2017-03-11 DIAGNOSIS — F419 Anxiety disorder, unspecified: Secondary | ICD-10-CM | POA: Diagnosis present

## 2017-03-11 DIAGNOSIS — Z794 Long term (current) use of insulin: Secondary | ICD-10-CM | POA: Diagnosis not present

## 2017-03-11 DIAGNOSIS — F17210 Nicotine dependence, cigarettes, uncomplicated: Secondary | ICD-10-CM | POA: Diagnosis present

## 2017-03-11 DIAGNOSIS — N185 Chronic kidney disease, stage 5: Secondary | ICD-10-CM | POA: Diagnosis present

## 2017-03-11 DIAGNOSIS — R0603 Acute respiratory distress: Secondary | ICD-10-CM | POA: Diagnosis not present

## 2017-03-11 DIAGNOSIS — E1122 Type 2 diabetes mellitus with diabetic chronic kidney disease: Secondary | ICD-10-CM | POA: Diagnosis present

## 2017-03-11 DIAGNOSIS — W010XXA Fall on same level from slipping, tripping and stumbling without subsequent striking against object, initial encounter: Secondary | ICD-10-CM | POA: Diagnosis not present

## 2017-03-11 DIAGNOSIS — D500 Iron deficiency anemia secondary to blood loss (chronic): Secondary | ICD-10-CM | POA: Diagnosis present

## 2017-03-11 DIAGNOSIS — I129 Hypertensive chronic kidney disease with stage 1 through stage 4 chronic kidney disease, or unspecified chronic kidney disease: Secondary | ICD-10-CM | POA: Diagnosis present

## 2017-03-11 DIAGNOSIS — G4700 Insomnia, unspecified: Secondary | ICD-10-CM | POA: Diagnosis present

## 2017-03-11 LAB — COMPREHENSIVE METABOLIC PANEL
ALT: 17 U/L (ref 14–54)
ANION GAP: 8 (ref 5–15)
AST: 15 U/L (ref 15–41)
Albumin: 2.8 g/dL — ABNORMAL LOW (ref 3.5–5.0)
Alkaline Phosphatase: 87 U/L (ref 38–126)
BILIRUBIN TOTAL: 0.5 mg/dL (ref 0.3–1.2)
BUN: 54 mg/dL — ABNORMAL HIGH (ref 6–20)
CALCIUM: 8.2 mg/dL — AB (ref 8.9–10.3)
CO2: 15 mmol/L — ABNORMAL LOW (ref 22–32)
Chloride: 120 mmol/L — ABNORMAL HIGH (ref 101–111)
Creatinine, Ser: 6.42 mg/dL — ABNORMAL HIGH (ref 0.44–1.00)
GFR calc Af Amer: 7 mL/min — ABNORMAL LOW (ref >60)
GFR calc non Af Amer: 6 mL/min — ABNORMAL LOW (ref >60)
Glucose, Bld: 129 mg/dL — ABNORMAL HIGH (ref 65–99)
POTASSIUM: 5.5 mmol/L — AB (ref 3.5–5.1)
Sodium: 143 mmol/L (ref 135–145)
TOTAL PROTEIN: 5.8 g/dL — AB (ref 6.5–8.1)

## 2017-03-11 LAB — GLUCOSE, CAPILLARY
GLUCOSE-CAPILLARY: 113 mg/dL — AB (ref 65–99)
GLUCOSE-CAPILLARY: 118 mg/dL — AB (ref 65–99)
GLUCOSE-CAPILLARY: 100 mg/dL — AB (ref 65–99)
Glucose-Capillary: 111 mg/dL — ABNORMAL HIGH (ref 65–99)

## 2017-03-11 LAB — CBC
HEMATOCRIT: 25.9 % — AB (ref 36.0–46.0)
Hemoglobin: 8.2 g/dL — ABNORMAL LOW (ref 12.0–15.0)
MCH: 28.3 pg (ref 26.0–34.0)
MCHC: 31.7 g/dL (ref 30.0–36.0)
MCV: 89.3 fL (ref 78.0–100.0)
Platelets: 228 10*3/uL (ref 150–400)
RBC: 2.9 MIL/uL — ABNORMAL LOW (ref 3.87–5.11)
RDW: 16 % — AB (ref 11.5–15.5)
WBC: 10.5 10*3/uL (ref 4.0–10.5)

## 2017-03-11 MED ORDER — FUROSEMIDE 10 MG/ML IJ SOLN
80.0000 mg | Freq: Four times a day (QID) | INTRAMUSCULAR | Status: AC
  Administered 2017-03-11 – 2017-03-12 (×2): 80 mg via INTRAVENOUS
  Filled 2017-03-11 (×2): qty 8

## 2017-03-11 MED ORDER — SODIUM POLYSTYRENE SULFONATE 15 GM/60ML PO SUSP
30.0000 g | Freq: Once | ORAL | Status: AC
  Administered 2017-03-11: 30 g via ORAL
  Filled 2017-03-11: qty 120

## 2017-03-11 NOTE — Progress Notes (Signed)
PROGRESS NOTE    [name]  DZH:[id number] DOB: [birth year] DOA: 03/10/2017 PCP: [name], NP  Brief Narrative:  71 y.o. female with medical history significant for hypertension, hyperlipidemia, diabetes, CKD stage V, not yet on dialysis, presenting to the ED with increasing shortness of breath, and nonproductive cough.  This was preceded by a fall around November 7, landing on the left side, causing left-sided rib pain since that time, and hurting her when taking a deep breath.  At the time, did not hit her head or lose consciousness.  She denies any chest pain or palpitations.  She denies rhinorrhea or hemoptysis.  Denies subjective fever or chills.  She denies any sick contacts, or recent long distance travel.  Denies any abdominal pain. Has decreased appetite due to current symptoms.patient still making urine.  She denies any dysuria or gross hematuria.  Denies lower extremity swelling. No confusion was reported. Denies any vision changes, double vision or headaches.       Assessment & Plan:   Active Problems:   Hyperlipidemia   Depression   Essential hypertension   INSOMNIA UNSPECIFIED   GERD (gastroesophageal reflux disease) with acute nausea    DM (diabetes mellitus) type II controlled, neurological manifestation (HCC)   Tobacco abuse   Iron deficiency anemia due to chronic blood loss   Anxiety   Chronic knee pain   CKD (chronic kidney disease) stage 5, GFR less than 15 ml/min (HCC)   CVA (cerebral vascular accident) ([place])   Pneumonia Acute respiratory failure with hypoxia secondary to community-acquired pneumonia.  Chest x-ray shows a right middle lobe and right lower lobe infiltrates.  Patient was hypoxic upon arrival to the emergency room placed on 2 L of oxygen.  And the saturation came up about 92%.  Patient also had small bilateral pleural effusion.  She was started on IV antibiotics and nebulizers.   end-stage renal disease patient is started on dialysis 11  /24.  Patient followed by [facility].  Patient has AV graft to the left upper extremity.  Potassium today is elevated at 5.3 will give 1 dose of Kayexalate.  Will let nephrology service now patient is admitted to the hospital.  No indications for acute dialysis needed at this time.  History of fall and left rib fracture treat symptomatically.  3 of hypertension continue atenolol and Norvasc.  Hyperlipidemia continue Pravachol.  History of CVA continue aspirin and Plavix  Anemia of chronic disease monitor hemoglobin  Type 2 diabetes patient carries a history of diabetes but not on any medications at home.  Currently on SSI.  Depression on Paxil  GERD on PPI     DVT prophylaxis: Heparin  code Status: Full Family Communication: No family available  disposition Plan DC home or skilled nursing facility once patient is stable.  Consultants:  Will consult nephrology service. Procedures: None Antimicrobials: Levofloxacin Subjective: Denies chest pain shortness of breath.  Objective: Resting in bed in no acute distress Vitals:   03/10/17 1800 03/10/17 2209 03/11/17 0420 03/11/17 1000  BP: 135/68 134/63 (!) 178/83 (!) 149/73  Pulse: (!) 58 68 76 63  Resp: 16 16 16 18   Temp: 98 F (36.7 C) 98 F (36.7 C) 98.9 F (37.2 C) 98 F (36.7 C)  TempSrc: Oral Oral Oral Oral  SpO2: 98% 95% 90% 91%  Weight:      Height:        Intake/Output Summary (Last 24 hours) at 03/11/2017 1029 Last data filed at 03/11/2017 0900  Gross per 24 hour  Intake 600 ml  Output 0 ml  Net 600 ml   Filed Weights   03/09/17 2212 03/10/17 1015  Weight: 72.6 kg (160 lb) 75 kg (165 lb 5.5 oz)    Examination:  General exam: Appears calm and comfortable  Respiratory system: Clear to auscultation. Respiratory effort normal. Cardiovascular system: S1 & S2 heard, RRR. No JVD, murmurs, rubs, gallops or clicks. No pedal edema. Gastrointestinal system: Abdomen is nondistended, soft and nontender.  No organomegaly or masses felt. Normal bowel sounds heard. Central nervous system: Alert and oriented. No focal neurological deficits. Extremities: Symmetric 5 x 5 power. Skin: No rashes, lesions or ulcers Psychiatry: Judgement and insight appear normal. Mood & affect appropriate.     Data Reviewed: I have personally reviewed following labs and imaging studies  CBC: Recent Labs  Lab 03/09/17 2219 03/11/17 0327  WBC 11.0* 10.5  NEUTROABS 5.9  --   HGB 8.4* 8.2*  HCT 26.3* 25.9*  MCV 90.1 89.3  PLT 249 458   Basic Metabolic Panel: Recent Labs  Lab 03/09/17 0857 03/09/17 2219 03/11/17 0327  NA 141 143 143  K 5.2* 5.1 5.5*  CL 117* 119* 120*  CO2 16* 16* 15*  GLUCOSE 120* 103* 129*  BUN 55* 55* 54*  CREATININE 6.50* 6.49* 6.42*  CALCIUM 8.0*  7.9* 7.8* 8.2*  PHOS 5.4*  --   --    GFR: Estimated Creatinine Clearance: 8.1 mL/min (A) (by C-G formula based on SCr of 6.42 mg/dL (H)). Liver Function Tests: Recent Labs  Lab 03/09/17 0857 03/09/17 2219 03/11/17 0327  AST  --  19 15  ALT  --  19 17  ALKPHOS  --  93 87  BILITOT  --  0.7 0.5  PROT  --  6.0* 5.8*  ALBUMIN 3.0* 3.1* 2.8*   No results for input(s): LIPASE, AMYLASE in the last 168 hours. No results for input(s): AMMONIA in the last 168 hours. Coagulation Profile: No results for input(s): INR, PROTIME in the last 168 hours. Cardiac Enzymes: No results for input(s): CKTOTAL, CKMB, CKMBINDEX, TROPONINI in the last 168 hours. BNP (last 3 results) No results for input(s): PROBNP in the last 8760 hours. HbA1C: Recent Labs    03/10/17 0758  HGBA1C 5.7*   CBG: Recent Labs  Lab 03/10/17 1131 03/10/17 1652 03/10/17 2206 03/11/17 0746  GLUCAP 87 101* 104* 113*   Lipid Profile: No results for input(s): CHOL, HDL, LDLCALC, TRIG, CHOLHDL, LDLDIRECT in the last 72 hours. Thyroid Function Tests: No results for input(s): TSH, T4TOTAL, FREET4, T3FREE, THYROIDAB in the last 72 hours. Anemia Panel: Recent  Labs    03/09/17 0857  FERRITIN 292  TIBC 200*  IRON 46   Sepsis Labs: Recent Labs  Lab 03/09/17 2231 03/10/17 1045  PROCALCITON  --  0.10  LATICACIDVEN 1.28 1.2  1.2    No results found for this or any previous visit (from the past 240 hour(s)).       Radiology Studies: Dg Chest 2 View  Result Date: 03/11/2017 CLINICAL DATA:  Pneumonia. EXAM: CHEST  2 VIEW COMPARISON:  Chest x-ray dated March 09, 2017. FINDINGS: The patient is rotated to the right. The cardiomediastinal silhouette is unchanged. Patchy consolidation in the right middle and lower lobes with adjacent small pleural effusion, similar to prior study. Trace left pleural effusion is unchanged. No pneumothorax. No acute osseous abnormality. IMPRESSION: Right middle and lower lobe pneumonia with adjacent small pleural effusion, similar to prior study. Electronically  Signed   By: Titus Dubin M.D.   On: 03/11/2017 09:06   Dg Chest 2 View  Result Date: 03/09/2017 CLINICAL DATA:  71 year old female with shortness of breath. EXAM: CHEST  2 VIEW COMPARISON:  Chest radiograph dated 12/02/2016 FINDINGS: There is a small right and probable trace left pleural effusions. Patchy area of density in the right lower and middle lobes is concerning for pneumonia. Clinical correlation is recommended. There is no pneumothorax. Stable top-normal cardiac silhouette. Osteopenia with degenerative changes of the spine. Mild old lower thoracic compression deformities. No acute osseous pathology. Atherosclerotic calcification of the aorta noted. IMPRESSION: Consolidative changes of the right lung most consistent with pneumonia. Clinical correlation and follow-up recommended. Small bilateral pleural effusions. Electronically Signed   By: Anner Crete M.D.   On: 03/09/2017 23:03        Scheduled Meds: . amLODipine  10 mg Oral Daily  . aspirin EC  325 mg Oral Daily  . atenolol  25 mg Oral Daily  . cholecalciferol  5,000 Units Oral  Daily  . cloNIDine  0.2 mg Oral BID  . clopidogrel  75 mg Oral Daily  . ezetimibe  10 mg Oral Daily  . heparin  5,000 Units Subcutaneous Q8H  . insulin aspart  0-9 Units Subcutaneous TID WC  . latanoprost  1 drop Both Eyes QHS  . pantoprazole  40 mg Oral Daily  . PARoxetine  40 mg Oral Daily  . pravastatin  40 mg Oral QHS   Continuous Infusions: . [START ON 03/12/2017] levofloxacin (LEVAQUIN) IV       LOS: 0 days    Georgette Shell, MD [facility]  If 7PM-7AM, please contact night-coverage [email or web address] Password [email or web address] 03/11/2017, 10:29 AM

## 2017-03-11 NOTE — Evaluation (Signed)
Physical Therapy Evaluation Patient Details Name: [name] MRN: [id number] DOB: [birth year] Today's Date: 03/11/2017   History of Present Illness  [name] is a 71 y.o. female with medical history significant for hypertension, hyperlipidemia, diabetes, CKD stage V, not yet on dialysis, presented to the ED with increasing shortness of breath, and nonproductive cough. This was preceded by a fall around November 7 with rib pain.   Clinical Impression  Pt admitted with above diagnosis. Pt currently with functional limitations due to the deficits listed below (see PT Problem List). On eval, pt required supervision transfers and min guard assist ambulation 180 feet without AD. Pt fatigues quickly. SpO2 remained > 90% during mobility on RA. Pt instructed in pursed lip breathing as pt tends to display short, shallow breaths.  Pt will benefit from skilled PT to increase their independence and safety with mobility to allow discharge to the venue listed below.       Follow Up Recommendations Home health PT;Supervision - Intermittent    Equipment Recommendations  None recommended by PT    Recommendations for Other Services       Precautions / Restrictions Precautions Precautions: Fall;Other (comment) Precaution Comments: watch sats      Mobility  Bed Mobility Overal bed mobility: Modified Independent                Transfers Overall transfer level: Needs assistance Equipment used: None Transfers: Sit to/from Stand;Stand Pivot Transfers Sit to Stand: Supervision Stand pivot transfers: Supervision       General transfer comment: supervision for safety only, increased time to stabilize initial standing balance  Ambulation/Gait Ambulation/Gait assistance: Min guard Ambulation Distance (Feet): 180 Feet Assistive device: None Gait Pattern/deviations: Step-through pattern;Decreased stride length;Shuffle Gait velocity: decreased Gait velocity interpretation: Below  normal speed for age/gender General Gait Details: Pt ambulated on RA with SpO2 remaining >90%. HR 73. Pt fatigues quickly. Standing rest break x 1 during ambulation.   Stairs            Wheelchair Mobility    Modified Rankin (Stroke Patients Only)       Balance Overall balance assessment: Needs assistance Sitting-balance support: No upper extremity supported Sitting balance-Leahy Scale: Good     Standing balance support: No upper extremity supported;During functional activity Standing balance-Leahy Scale: Fair                               Pertinent Vitals/Pain Pain Assessment: No/denies pain    Home Living Family/patient expects to be discharged to:: Private residence Living Arrangements: Alone Available Help at Discharge: Family;Friend(s);Available PRN/intermittently Type of Home: House Home Access: Stairs to enter Entrance Stairs-Rails: None Entrance Stairs-Number of Steps: 4 Home Layout: One level Home Equipment: Cane - single point      Prior Function Level of Independence: Independent with assistive device(s)         Comments: Uses cane outside of house. Brother provides transportation.      Hand Dominance   Dominant Hand: Right    Extremity/Trunk Assessment   Upper Extremity Assessment Upper Extremity Assessment: Defer to OT evaluation    Lower Extremity Assessment Lower Extremity Assessment: Generalized weakness    Cervical / Trunk Assessment Cervical / Trunk Assessment: Normal  Communication   Communication: No difficulties  Cognition Arousal/Alertness: Awake/alert Behavior During Therapy: WFL for tasks assessed/performed Overall Cognitive Status: Within Functional Limits for tasks assessed  General Comments      Exercises     Assessment/Plan    PT Assessment Patient needs continued PT services  PT Problem List Decreased strength;Decreased  mobility;Cardiopulmonary status limiting activity;Decreased activity tolerance;Decreased balance       PT Treatment Interventions Therapeutic activities;Gait training;Therapeutic exercise;Patient/family education;Stair training;Balance training;Functional mobility training    PT Goals (Current goals can be found in the Care Plan section)  Acute Rehab PT Goals Patient Stated Goal: feel better PT Goal Formulation: With patient Time For Goal Achievement: 03/18/17 Potential to Achieve Goals: Good    Frequency Min 3X/week   Barriers to discharge        Co-evaluation               AM-PAC PT "6 Clicks" Daily Activity  Outcome Measure Difficulty turning over in bed (including adjusting bedclothes, sheets and blankets)?: None Difficulty moving from lying on back to sitting on the side of the bed? : None Difficulty sitting down on and standing up from a chair with arms (e.g., wheelchair, bedside commode, etc,.)?: A Little Help needed moving to and from a bed to chair (including a wheelchair)?: None Help needed walking in hospital room?: A Little Help needed climbing 3-5 steps with a railing? : A Little 6 Click Score: 21    End of Session Equipment Utilized During Treatment: Gait belt Activity Tolerance: Patient tolerated treatment well Patient left: in bed;with call bell/phone within reach;with bed alarm set Nurse Communication: Mobility status PT Visit Diagnosis: Difficulty in walking, not elsewhere classified (R26.2);Muscle weakness (generalized) (M62.81)    Time: 5885-0277 PT Time Calculation (min) (ACUTE ONLY): 11 min   Charges:   PT Evaluation $PT Eval Low Complexity: 1 Low     PT G Codes:        Lorrin Goodell, PT  Office # [phone] Pager [phone]   Lorriane Shire 03/11/2017, 11:19 AM

## 2017-03-11 NOTE — Progress Notes (Signed)
OT Cancellation Note  Patient Details Name: [name] MRN: [id number] DOB: 1945-11-22   Cancelled Treatment:    Reason Eval/Treat Not Completed: Patient declined, no reason specified. Pt is too tired and asked OT to come back another time. OT will check again tomorrow. Please bring energy conservation education.  [place] 03/11/2017, 4:57 PM  Hulda Humphrey OTR/L [phone]

## 2017-03-11 NOTE — Consult Note (Signed)
Renal Service Consult Note [place] 03/11/2017 [name] Requesting Physician: Dr Rodena Piety  Reason for Consult:  CKD patient with  HPI: The patient is a 71 y.o. year-old who presented to ED on 11/17 after falling at home (tripped over the dog) and hitting her L side.  She had L sided CP. In the ED she was hypoxemic and CXR showed large RLL infiltrates c/w PNA.  No fever, not on home O2.  She has known CKD f/b Dr Marval Regal and just had LUA AV graft placed on 02/13/17 by Dr Oneida Alar. Asked to see for CKD IV.   Initial CXR on 11/16 showed RLL densities.  Repeat CXR today shows same RLL changes w/ what looks like new CHF pattern.  Pt denies sig SOB or orthopena.  Not on lasix at home, doesn't know her meds.    +smoker, lives alone. Has had ok appetite, occ nausea, vomited once recently. No fevers.   ROS  denies CP  no joint pain   no HA  no blurry vision  no rash   Past Medical History  Past Medical History:  Diagnosis Date  . Anemia   . Anxiety   . Arthritis    lower back and knees   . Asthma    childhood  . Barrett's esophagus   . Chronic kidney disease   . Depression   . Diabetes mellitus   . Dysphagia   . Eczema   . History of herpes zoster 02/2010   Recovered fully after period of acute herpetic neuralgia tx w/ gabapentin.   Marland Kitchen Hx MRSA infection 2009  . Hyperlipidemia   . Hypertension   . Neuromuscular disorder (HCC)    chronic pain  . Personal history of colonic polyps 10/17/2010   hyperplastic   Past Surgical History  Past Surgical History:  Procedure Laterality Date  . ABDOMINAL HYSTERECTOMY     unclear when  . ARTERIOVENOUS (AV) GORE-TEX STRETCH GRAFT INSERTION INTO LEFT ARM Left 02/13/2017   Performed by Elam Dutch, MD at [facility]  . CATARACT EXTRACTION Bilateral   . thigh surg     left side due to MRSA   Family History  Family History  Problem Relation Age of Onset  . Hypertension Father   . Kidney  disease Father   . Diabetes Brother   . Colon cancer Neg Hx   . CAD Neg Hx    Social History  reports that she has been smoking cigarettes.  She has a 40.00 pack-year smoking history. she has never used smokeless tobacco. She reports that she drinks about 1.2 oz of alcohol per week. She reports that she does not use drugs. Allergies  Allergies  Allergen Reactions  . Lisinopril Other (See Comments)    Abnormal Kidney Function   . Pioglitazone Other (See Comments)    REACTION: Desquamation of skin of the palm  . Morphine And Related Nausea And Vomiting  . Sulfonamide Derivatives Rash   Home medications Prior to Admission medications   Medication Sig Start Date End Date Taking? Authorizing Provider  ALPRAZolam Duanne Moron) 0.5 MG tablet Take one tablet by mouth twice daily as needed for anxiety 03/06/17  Yes [name], NP  amLODipine (NORVASC) 10 MG tablet Take 1 tablet (10 mg total) by mouth daily. 01/02/17  Yes [name], NP  aspirin EC 325 MG tablet Take 1 tablet (325 mg total) by mouth daily. 01/02/17  Yes [name], NP  atenolol (  TENORMIN) 25 MG tablet Take 1 tablet (25 mg total) by mouth daily. 01/02/17  Yes [name], NP  Cholecalciferol (VITAMIN D3) 5000 units TABS Take 1 tablet (5,000 Units total) by mouth daily. 01/02/17  Yes [name], NP  cloNIDine (CATAPRES) 0.2 MG tablet Take 1 tablet (0.2 mg total) by mouth 2 (two) times daily. 01/02/17  Yes [name], NP  clopidogrel (PLAVIX) 75 MG tablet Take 1 tablet (75 mg total) by mouth daily. 01/02/17  Yes [name], NP  ezetimibe (ZETIA) 10 MG tablet Take 1 tablet (10 mg total) by mouth daily. 01/02/17 01/02/18 Yes [name], NP  latanoprost (XALATAN) 0.005 % ophthalmic solution Place 1 drop into both eyes at bedtime. Reported on 08/27/2015 04/23/12  Yes [provider]  Melatonin 3 MG TABS 6 mg by mouth as needed for sleep. Patient taking differently: Take 6 mg by  mouth at bedtime as needed (sleep).  01/02/17  Yes [name], NP  omeprazole (PRILOSEC) 40 MG capsule Take 40 mg by mouth daily. 01/22/17  Yes [provider]  oxyCODONE-acetaminophen (ROXICET) 5-325 MG tablet Take 1 tablet by mouth every 6 (six) hours as needed for severe pain. 02/13/17  Yes Rhyne, Hulen Shouts, PA-C  PARoxetine (PAXIL) 40 MG tablet Take 1 tablet (40 mg total) by mouth daily. 01/02/17  Yes [name], NP  pravastatin (PRAVACHOL) 40 MG tablet Take 1 tablet (40 mg total) by mouth at bedtime. 02/19/17  Yes [name], NP  SUMAtriptan (IMITREX) 25 MG tablet 1 tablet daily as needed for headache, May repeat in 2 hours if headache persists or recurs. Max of 2 tablets in 24 hours 01/02/17  Yes Eubanks, Carlos American, NP  triamcinolone cream (KENALOG) 0.1 % Apply 1 application topically 3 (three) times daily as needed (rash).  04/14/15  Yes [provider]   Liver Function Tests Recent Labs  Lab 03/09/17 0857 03/09/17 2219 03/11/17 0327  AST  --  19 15  ALT  --  19 17  ALKPHOS  --  93 87  BILITOT  --  0.7 0.5  PROT  --  6.0* 5.8*  ALBUMIN 3.0* 3.1* 2.8*   No results for input(s): LIPASE, AMYLASE in the last 168 hours. CBC Recent Labs  Lab 03/09/17 2219 03/11/17 0327  WBC 11.0* 10.5  NEUTROABS 5.9  --   HGB 8.4* 8.2*  HCT 26.3* 25.9*  MCV 90.1 89.3  PLT 249 629   Basic Metabolic Panel Recent Labs  Lab 03/09/17 0857 03/09/17 2219 03/11/17 0327  NA 141 143 143  K 5.2* 5.1 5.5*  CL 117* 119* 120*  CO2 16* 16* 15*  GLUCOSE 120* 103* 129*  BUN 55* 55* 54*  CREATININE 6.50* 6.49* 6.42*  CALCIUM 8.0*  7.9* 7.8* 8.2*  PHOS 5.4*  --   --    Iron/TIBC/Ferritin/ %Sat    Component Value Date/Time   IRON 46 03/09/2017 0857   TIBC 200 (L) 03/09/2017 0857   FERRITIN 292 03/09/2017 0857   IRONPCTSAT 23 03/09/2017 0857   IRONPCTSAT 53 09/05/2013 1100    Vitals:   03/10/17 2209 03/11/17 0420 03/11/17 1000 03/11/17 1716  BP: 134/63  (!) 178/83 (!) 149/73 (!) 161/58  Pulse: 68 76 63 68  Resp: 16 16 18 18   Temp: 98 F (36.7 C) 98.9 F (37.2 C) 98 F (36.7 C) 98 F (36.7 C)  TempSrc: Oral Oral Oral Oral  SpO2: 95% 90% 91% 96%  Weight:  Height:       Exam Gen chronic ill appearing, no distress No rash, cyanosis or gangrene Sclera anicteric, throat clear  +JVD , no bruits Chest dec'd R base, bibasilar soft rales RRR no MRG Abd soft ntnd no mass or ascites +bs GU defer MS no joint effusions or deformity Ext 1+ bilat pretib edema / no wounds or ulcers Neuro is alert, Ox 3 , nf  Na 143  K 5.5  BUN 54  Cr 6.42  CA 8.2  Alb 2.8  Hb 8.2    Impression: 1. CKD stage V - eGFR very low, pt's not grossly uremic but looks tired.  She is reluctant to start HD.  Had an AVG placed on 10/23 which would be ok to use now.  She will think about it.  2. Volume - looks wet on today's film, up 2kg since admit. Will start IV lasix.  3. HTN - on BB/ norvasc/ clonidine at home. BP's up some. Diurese.  4. 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  ECHO normal, some carotid plaquing. Neuro rec'd asa and plavix.    Plan - as above.   Kelly Splinter MD [employer] pager [phone]   03/11/2017, 7:45 PM

## 2017-03-12 ENCOUNTER — Encounter: Payer: Self-pay | Admitting: *Deleted

## 2017-03-12 DIAGNOSIS — R0902 Hypoxemia: Secondary | ICD-10-CM

## 2017-03-12 LAB — POCT HEMOGLOBIN-HEMACUE: HEMOGLOBIN: 8.1 g/dL — AB (ref 12.0–15.0)

## 2017-03-12 LAB — BASIC METABOLIC PANEL
Anion gap: 10 (ref 5–15)
BUN: 50 mg/dL — AB (ref 6–20)
CO2: 16 mmol/L — ABNORMAL LOW (ref 22–32)
CREATININE: 6.67 mg/dL — AB (ref 0.44–1.00)
Calcium: 8.2 mg/dL — ABNORMAL LOW (ref 8.9–10.3)
Chloride: 117 mmol/L — ABNORMAL HIGH (ref 101–111)
GFR calc Af Amer: 6 mL/min — ABNORMAL LOW (ref >60)
GFR, EST NON AFRICAN AMERICAN: 6 mL/min — AB (ref >60)
GLUCOSE: 131 mg/dL — AB (ref 65–99)
POTASSIUM: 4.1 mmol/L (ref 3.5–5.1)
Sodium: 143 mmol/L (ref 135–145)

## 2017-03-12 LAB — GLUCOSE, CAPILLARY
GLUCOSE-CAPILLARY: 101 mg/dL — AB (ref 65–99)
GLUCOSE-CAPILLARY: 98 mg/dL (ref 65–99)
Glucose-Capillary: 92 mg/dL (ref 65–99)
Glucose-Capillary: 126 mg/dL — ABNORMAL HIGH (ref 65–99)
Glucose-Capillary: 96 mg/dL (ref 65–99)

## 2017-03-12 LAB — CG4 I-STAT (LACTIC ACID): Lactic Acid, Venous: 0.81 mmol/L (ref 0.5–1.9)

## 2017-03-12 MED ORDER — DARBEPOETIN ALFA 100 MCG/0.5ML IJ SOSY
100.0000 ug | PREFILLED_SYRINGE | INTRAMUSCULAR | Status: DC
  Administered 2017-03-12: 100 ug via SUBCUTANEOUS
  Filled 2017-03-12 (×2): qty 0.5

## 2017-03-12 NOTE — Evaluation (Signed)
Occupational Therapy Evaluation Patient Details Name: [name] MRN: [id number] DOB: 04-26-1945 Today's Date: 03/12/2017    History of Present Illness [name] is a 71 y.o. female with medical history significant for hypertension, hyperlipidemia, diabetes, CKD stage V, not yet on dialysis, presented to the ED with increasing shortness of breath, and nonproductive cough. This was preceded by a fall around November 7 with rib pain.    Clinical Impression   Pt admitted with above. She demonstrates the below listed deficits and will benefit from continued OT to maximize safety and independence with BADLs.  Pt presents to OT with generalized weakness, and decreased activity tolerance - pt fatigues fairly quickly with activity with DOE 3/4.  She requires frequent rest breaks.  She lives alone with intermittent assist available from family.  She was mod I with ADLs PTA.  Will follow acutely.          Follow Up Recommendations  No OT follow up;Supervision - Intermittent    Equipment Recommendations  None recommended by OT    Recommendations for Other Services       Precautions / Restrictions Precautions Precautions: Fall;Other (comment) Precaution Comments: watch sats      Mobility Bed Mobility Overal bed mobility: Modified Independent                Transfers Overall transfer level: Needs assistance Equipment used: None Transfers: Sit to/from Stand;Stand Pivot Transfers Sit to Stand: Supervision Stand pivot transfers: Supervision            Balance Overall balance assessment: Needs assistance Sitting-balance support: No upper extremity supported Sitting balance-Leahy Scale: Good     Standing balance support: No upper extremity supported;During functional activity Standing balance-Leahy Scale: Fair                             ADL either performed or assessed with clinical judgement   ADL Overall ADL's : Needs  assistance/impaired Eating/Feeding: Independent   Grooming: Wash/dry hands;Wash/dry face;Oral care;Brushing hair;Supervision/safety;Standing   Upper Body Bathing: Set up;Sitting   Lower Body Bathing: Supervison/ safety;Sit to/from stand   Upper Body Dressing : Supervision/safety;Sitting   Lower Body Dressing: Supervision/safety;Sit to/from stand   Toilet Transfer: Supervision/safety;Ambulation;Comfort height toilet;Grab bars   Toileting- Clothing Manipulation and Hygiene: Supervision/safety;Sit to/from stand       Functional mobility during ADLs: Supervision/safety General ADL Comments: Pt fatigues quickly requiring frequent rest breaks      Vision         Perception     Praxis      Pertinent Vitals/Pain Pain Assessment: No/denies pain     Hand Dominance Right   Extremity/Trunk Assessment Upper Extremity Assessment Upper Extremity Assessment: Generalized weakness   Lower Extremity Assessment Lower Extremity Assessment: Defer to PT evaluation   Cervical / Trunk Assessment Cervical / Trunk Assessment: Normal   Communication Communication Communication: No difficulties   Cognition Arousal/Alertness: Awake/alert Behavior During Therapy: WFL for tasks assessed/performed Overall Cognitive Status: Within Functional Limits for tasks assessed                                     General Comments  Pt with DOE 2/4     Exercises     Shoulder Instructions      Home Living Family/patient expects to be discharged to:: Private residence Living Arrangements: Alone Available Help at Discharge:  Family;Friend(s);Available PRN/intermittently Type of Home: House Home Access: Stairs to enter [employer] of Steps: 4 Entrance Stairs-Rails: None Home Layout: One level     Bathroom Shower/Tub: Teacher, early years/pre: Standard     Home Equipment: Cane - single point          Prior Functioning/Environment Level of  Independence: Independent with assistive device(s)        Comments: Pt takes tub bathes.  Uses cane outside of house - walks her dog several miles/day. Brother provides transportation.         OT Problem List: Decreased strength;Decreased activity tolerance;Impaired balance (sitting and/or standing);Cardiopulmonary status limiting activity      OT Treatment/Interventions: Self-care/ADL training;Therapeutic exercise;DME and/or AE instruction;Therapeutic activities;Energy conservation    OT Goals(Current goals can be found in the care plan section) Acute Rehab OT Goals Patient Stated Goal: to get stronger  OT Goal Formulation: With patient Time For Goal Achievement: 03/26/17 Potential to Achieve Goals: Good ADL Goals Pt Will Perform Grooming: with modified independence;standing Pt Will Perform Upper Body Bathing: with modified independence;standing;sitting Pt Will Perform Lower Body Bathing: with modified independence;sit to/from stand Pt Will Perform Upper Body Dressing: with modified independence;sitting Pt Will Perform Lower Body Dressing: with modified independence;sit to/from stand Pt Will Transfer to Toilet: with modified independence;ambulating;regular height toilet;grab bars Pt Will Perform Toileting - Clothing Manipulation and hygiene: with modified independence;sit to/from stand Pt Will Perform Tub/Shower Transfer: Tub transfer;with supervision;ambulating;grab bars Additional ADL Goal #1: Pt will be independent with energy conservation techniques during ADLs  OT Frequency: Min 2X/week   Barriers to D/C:            Co-evaluation              AM-PAC PT "6 Clicks" Daily Activity     Outcome Measure Help from another person eating meals?: None Help from another person taking care of personal grooming?: A Little Help from another person toileting, which includes using toliet, bedpan, or urinal?: A Little Help from another person bathing (including washing, rinsing,  drying)?: A Little Help from another person to put on and taking off regular upper body clothing?: A Little Help from another person to put on and taking off regular lower body clothing?: A Little 6 Click Score: 19   End of Session Nurse Communication: Mobility status  Activity Tolerance: Patient limited by fatigue Patient left: in bed;with call bell/phone within reach;with bed alarm set  OT Visit Diagnosis: Unsteadiness on feet (R26.81)                Time: 1442-1510 OT Time Calculation (min): 28 min Charges:  OT General Charges $OT Visit: 1 Visit OT Evaluation $OT Eval Low Complexity: 1 Low OT Treatments $Therapeutic Activity: 8-22 mins G-Codes:     [facility], OTR/L [phone]   Lucille Passy M 03/12/2017, 8:39 PM

## 2017-03-12 NOTE — Progress Notes (Signed)
PROGRESS NOTE    AYANO DOUTHITT  DEY:[id number] DOB: March 16, 1946 DOA: 03/10/2017 PCP: [name], NP  Brief Narrative: 71 y.o.femalewith medical history significant forhypertension, hyperlipidemia, diabetes, CKD stage V, not yet on dialysis, presenting to the ED with increasing shortness of breath, and nonproductive cough. This was preceded by a fall around November 7, landing on the left side, causing left-sided rib pain since that time, and hurting her when taking a deep breath. At the time, did not hit her head or lose consciousness. She denies any chest pain or palpitations. She denies rhinorrhea or hemoptysis. Denies subjective fever or chills. She denies any sick contacts, or recent long distance travel. Denies any abdominal pain. Has decreased appetite due to current symptoms.patient still making urine. She denies any dysuria or gross hematuria. Denies lower extremity swelling. No confusion was reported. Denies any vision    Assessment & Plan:   Active Problems:   Hyperlipidemia   Depression   Essential hypertension   INSOMNIA UNSPECIFIED   GERD (gastroesophageal reflux disease) with acute nausea    DM (diabetes mellitus) type II controlled, neurological manifestation (HCC)   Tobacco abuse   Iron deficiency anemia due to chronic blood loss   Anxiety   Chronic knee pain   CKD (chronic kidney disease) stage 5, GFR less than 15 ml/min (HCC)   CVA (cerebral vascular accident) ([place])   Pneumonia  Acute respiratory failure with hypoxia secondary to community-acquired pneumonia.  Chest x-ray shows a right middle lobe and right lower lobe infiltrates.  Patient was hypoxic upon arrival to the emergency room placed on 2 L of oxygen.  And the saturation came up about 92%.  Patient also had small bilateral pleural effusion.  She was started on IV antibiotics and nebulizers.   end-stage renal disease patient is started on dialysis 11 /24.  Patient followed by [facility].  Patient has AV graft to the left upper extremity.  Potassium today is elevated at 5.3 will give 1 dose of Kayexalate.  Appreciate nephrology input patient started on Lasix yesterday.  History of fall and left rib fracture treat symptomatically.  hypertension continue atenolol and Norvasc.  Hyperlipidemia continue Pravachol.  History of CVA continue aspirin and Plavix  Anemia of chronic disease monitor hemoglobin  Type 2 diabetes patient carries a history of diabetes but not on any medications at home.  Currently on SSI.  Depression on Paxil    DVT prophylaxis: Heparin Code Status: Full code Family Communication: No family available patient lives alone her brother lives opposite her house. Disposition Plan: Plan discharge once IV antibiotics are done patient reports that she does not want to start dialysis unless it is absolutely necessary.   Consultants: Nephrology None procedures: Antimicrobials: Levofloxacin  Subjective feeling better  Objective: Sitting up in bed trying to eat breakfast asking for more salt Vitals:   03/11/17 2115 03/12/17 0429 03/12/17 0743 03/12/17 0830  BP: (!) 152/72 (!) 151/62 (!) 158/66 (!) 156/64  Pulse: 67 68 62 62  Resp: 15 15 16 16   Temp: 98.7 F (37.1 C) 100.1 F (37.8 C) 98.2 F (36.8 C) 98.2 F (36.8 C)  TempSrc:   Oral Oral  SpO2: 93% 98% 94% 98%  Weight: 75 kg (165 lb 5.5 oz)   75 kg (165 lb 5.5 oz)  Height:        Intake/Output Summary (Last 24 hours) at 03/12/2017 1133 Last data filed at 03/12/2017 3149 Gross per 24 hour  Intake 580 ml  Output  0 ml  Net 580 ml   Filed Weights   03/10/17 1015 03/11/17 2115 03/12/17 0830  Weight: 75 kg (165 lb 5.5 oz) 75 kg (165 lb 5.5 oz) 75 kg (165 lb 5.5 oz)    Examination:  General exam: Appears calm and comfortable  Respiratory system: Clear to auscultation. Respiratory effort normal.  Decreased breath sounds at the bases Cardiovascular system: S1 & S2 heard, RRR.  No JVD, murmurs, rubs, gallops or clicks. No pedal edema. Gastrointestinal system: Abdomen is nondistended, soft and nontender. No organomegaly or masses felt. Normal bowel sounds heard. Central nervous system: Alert and oriented. No focal neurological deficits. Extremities: Symmetric 5 x 5 power. Skin: No rashes, lesions or ulcers Psychiatry: Judgement and insight appear normal. Mood & affect appropriate.     Data Reviewed: I have personally reviewed following labs and imaging studies  CBC: Recent Labs  Lab 03/09/17 2219 03/11/17 0327  WBC 11.0* 10.5  NEUTROABS 5.9  --   HGB 8.4* 8.2*  HCT 26.3* 25.9*  MCV 90.1 89.3  PLT 249 683   Basic Metabolic Panel: Recent Labs  Lab 03/09/17 0857 03/09/17 2219 03/11/17 0327 03/12/17 0455  NA 141 143 143 143  K 5.2* 5.1 5.5* 4.1  CL 117* 119* 120* 117*  CO2 16* 16* 15* 16*  GLUCOSE 120* 103* 129* 131*  BUN 55* 55* 54* 50*  CREATININE 6.50* 6.49* 6.42* 6.67*  CALCIUM 8.0*  7.9* 7.8* 8.2* 8.2*  PHOS 5.4*  --   --   --    GFR: Estimated Creatinine Clearance: 7.8 mL/min (A) (by C-G formula based on SCr of 6.67 mg/dL (H)). Liver Function Tests: Recent Labs  Lab 03/09/17 0857 03/09/17 2219 03/11/17 0327  AST  --  19 15  ALT  --  19 17  ALKPHOS  --  93 87  BILITOT  --  0.7 0.5  PROT  --  6.0* 5.8*  ALBUMIN 3.0* 3.1* 2.8*   No results for input(s): LIPASE, AMYLASE in the last 168 hours. No results for input(s): AMMONIA in the last 168 hours. Coagulation Profile: No results for input(s): INR, PROTIME in the last 168 hours. Cardiac Enzymes: No results for input(s): CKTOTAL, CKMB, CKMBINDEX, TROPONINI in the last 168 hours. BNP (last 3 results) No results for input(s): PROBNP in the last 8760 hours. HbA1C: Recent Labs    03/10/17 0758  HGBA1C 5.7*   CBG: Recent Labs  Lab 03/11/17 0746 03/11/17 1137 03/11/17 1648 03/11/17 2132 03/12/17 0748  GLUCAP 113* 118* 100* 111* 126*   Lipid Profile: No results for  input(s): CHOL, HDL, LDLCALC, TRIG, CHOLHDL, LDLDIRECT in the last 72 hours. Thyroid Function Tests: No results for input(s): TSH, T4TOTAL, FREET4, T3FREE, THYROIDAB in the last 72 hours. Anemia Panel: No results for input(s): VITAMINB12, FOLATE, FERRITIN, TIBC, IRON, RETICCTPCT in the last 72 hours. Sepsis Labs: Recent Labs  Lab 03/09/17 2231 03/10/17 0613 03/10/17 1045  PROCALCITON  --   --  0.10  LATICACIDVEN 1.28 0.81 1.2  1.2    Recent Results (from the past 240 hour(s))  Blood culture (routine x 2)     Status: None (Preliminary result)   Collection Time: 03/10/17  5:46 AM  Result Value Ref Range Status   Specimen Description BLOOD RIGHT ARM  Final   Special Requests   Final    BOTTLES DRAWN AEROBIC AND ANAEROBIC Blood Culture results may not be optimal due to an excessive volume of blood received in culture bottles   Culture NO  GROWTH 2 DAYS  Final   Report Status PENDING  Incomplete  Blood culture (routine x 2)     Status: None (Preliminary result)   Collection Time: 03/10/17  6:05 AM  Result Value Ref Range Status   Specimen Description BLOOD RIGHT ARM  Final   Special Requests IN PEDIATRIC BOTTLE Blood Culture adequate volume  Final   Culture NO GROWTH 2 DAYS  Final   Report Status PENDING  Incomplete         Radiology Studies: Dg Chest 2 View  Result Date: 03/11/2017 CLINICAL DATA:  Pneumonia. EXAM: CHEST  2 VIEW COMPARISON:  Chest x-ray dated March 09, 2017. FINDINGS: The patient is rotated to the right. The cardiomediastinal silhouette is unchanged. Patchy consolidation in the right middle and lower lobes with adjacent small pleural effusion, similar to prior study. Trace left pleural effusion is unchanged. No pneumothorax. No acute osseous abnormality. IMPRESSION: Right middle and lower lobe pneumonia with adjacent small pleural effusion, similar to prior study. Electronically Signed   By: Titus Dubin M.D.   On: 03/11/2017 09:06        Scheduled  Meds: . amLODipine  10 mg Oral Daily  . aspirin EC  325 mg Oral Daily  . atenolol  25 mg Oral Daily  . cholecalciferol  5,000 Units Oral Daily  . cloNIDine  0.2 mg Oral BID  . clopidogrel  75 mg Oral Daily  . ezetimibe  10 mg Oral Daily  . heparin  5,000 Units Subcutaneous Q8H  . insulin aspart  0-9 Units Subcutaneous TID WC  . latanoprost  1 drop Both Eyes QHS  . pantoprazole  40 mg Oral Daily  . PARoxetine  40 mg Oral Daily  . pravastatin  40 mg Oral QHS   Continuous Infusions: . levofloxacin (LEVAQUIN) IV Stopped (03/12/17 9150)     LOS: 1 day      Georgette Shell, MD [facility]  If 7PM-7AM, please contact night-coverage [email or web address] Password [email or web address] 03/12/2017, 11:33 AM

## 2017-03-12 NOTE — Progress Notes (Signed)
Physical Therapy Treatment Patient Details Name: [name] MRN: [id number] DOB: 1945-06-14 Today's Date: 03/12/2017    History of Present Illness [name] is a 71 y.o. female with medical history significant for hypertension, hyperlipidemia, diabetes, CKD stage V, not yet on dialysis, presented to the ED with increasing shortness of breath, and nonproductive cough. This was preceded by a fall around November 7 with rib pain.     PT Comments    Patient mobilizing well, no physical assist or assistive device required. Remains limited by decreased activity tolerance. Current POC remains appropriate.   Follow Up Recommendations  Home health PT;Supervision - Intermittent     Equipment Recommendations  None recommended by PT    Recommendations for Other Services       Precautions / Restrictions Precautions Precautions: Fall;Other (comment) Precaution Comments: watch sats    Mobility  Bed Mobility Overal bed mobility: Modified Independent                Transfers Overall transfer level: Needs assistance Equipment used: None Transfers: Sit to/from Stand;Stand Pivot Transfers Sit to Stand: Supervision Stand pivot transfers: Supervision       General transfer comment: supervision for safety only, increased time to stabilize initial standing balance  Ambulation/Gait Ambulation/Gait assistance: Min guard Ambulation Distance (Feet): 210 Feet Assistive device: None Gait Pattern/deviations: Step-through pattern;Decreased stride length;Shuffle Gait velocity: decreased Gait velocity interpretation: Below normal speed for age/gender General Gait Details: ambualte don room air, saturations >90% but patient with 3/4 DOE upon completion. approximately 2 minutes seated rest break to recover   Stairs            Wheelchair Mobility    Modified Rankin (Stroke Patients Only)       Balance Overall balance assessment: Needs assistance Sitting-balance  support: No upper extremity supported Sitting balance-Leahy Scale: Good     Standing balance support: No upper extremity supported;During functional activity Standing balance-Leahy Scale: Fair                              Cognition Arousal/Alertness: Awake/alert Behavior During Therapy: WFL for tasks assessed/performed Overall Cognitive Status: Within Functional Limits for tasks assessed                                        Exercises      General Comments        Pertinent Vitals/Pain Pain Assessment: No/denies pain    Home Living                      Prior Function            PT Goals (current goals can now be found in the care plan section) Acute Rehab PT Goals Patient Stated Goal: feel better PT Goal Formulation: With patient Time For Goal Achievement: 03/18/17 Potential to Achieve Goals: Good Progress towards PT goals: Progressing toward goals    Frequency    Min 3X/week      PT Plan Current plan remains appropriate    Co-evaluation              AM-PAC PT "6 Clicks" Daily Activity  Outcome Measure  Difficulty turning over in bed (including adjusting bedclothes, sheets and blankets)?: None Difficulty moving from lying on back to sitting on the side of the bed? : None  Difficulty sitting down on and standing up from a chair with arms (e.g., wheelchair, bedside commode, etc,.)?: A Little Help needed moving to and from a bed to chair (including a wheelchair)?: None Help needed walking in hospital room?: A Little Help needed climbing 3-5 steps with a railing? : A Little 6 Click Score: 21    End of Session Equipment Utilized During Treatment: Gait belt Activity Tolerance: Patient tolerated treatment well Patient left: in bed;with call bell/phone within reach;with bed alarm set Nurse Communication: Mobility status PT Visit Diagnosis: Difficulty in walking, not elsewhere classified (R26.2);Muscle weakness  (generalized) (M62.81)     Time: 7616-0737 PT Time Calculation (min) (ACUTE ONLY): 14 min  Charges:  $Gait Training: 8-22 mins                    G Codes:       Alben Deeds, PT DPT  Board Certified Neurologic Specialist [place] 03/12/2017, 8:46 AM

## 2017-03-12 NOTE — Progress Notes (Signed)
[facility] Rounding Note  Subjective/Interval History:  Worried about her dog Says she need to "get it placed" because not sure can handle Won't quite commit to starting HD but close  Objective Vital signs in last 24 hours: Vitals:   03/12/17 0429 03/12/17 0743 03/12/17 0830 03/12/17 1647  BP: (!) 151/62 (!) 158/66 (!) 156/64 (!) 151/71  Pulse: 68 62 62 (!) 55  Resp: 15 16 16 16   Temp: 100.1 F (37.8 C) 98.2 F (36.8 C) 98.2 F (36.8 C) 98.3 F (36.8 C)  TempSrc:  Oral Oral Oral  SpO2: 98% 94% 98% 97%  Weight:   75 kg (165 lb 5.5 oz)   Height:       Weight change: 0 kg (0 lb)  Intake/Output Summary (Last 24 hours) at 03/12/2017 1701 Last data filed at 03/12/2017 1400 Gross per 24 hour  Intake 220 ml  Output 400 ml  Net -180 ml   Physical Exam:  Blood pressure (!) 151/71, pulse (!) 55, temperature 98.3 F (36.8 C), temperature source Oral, resp. rate 16, height 5' 5"  (1.651 m), weight 75 kg (165 lb 5.5 oz), SpO2 97 %.  Gen chronic ill appearing, no distress Sclera anicteric, throat clear  +JVD , no bruits Chest anteriorly coarse BS RRR no MRG No rub Abd soft ntnd no mass or ascites Large pannus Ext trace bilat pretib edema Neuro is alert, Ox 3 No asterixus  Recent Labs  Lab 03/09/17 0857 03/09/17 2219 03/11/17 0327 03/12/17 0455  NA 141 143 143 143  K 5.2* 5.1 5.5* 4.1  CL 117* 119* 120* 117*  CO2 16* 16* 15* 16*  GLUCOSE 120* 103* 129* 131*  BUN 55* 55* 54* 50*  CREATININE 6.50* 6.49* 6.42* 6.67*  CALCIUM 8.0*  7.9* 7.8* 8.2* 8.2*  PHOS 5.4*  --   --   --     Recent Labs  Lab 03/09/17 0857 03/09/17 2219 03/11/17 0327  AST  --  19 15  ALT  --  19 17  ALKPHOS  --  93 87  BILITOT  --  0.7 0.5  PROT  --  6.0* 5.8*  ALBUMIN 3.0* 3.1* 2.8*    Recent Labs  Lab 03/09/17 0908 03/09/17 2219 03/11/17 0327  WBC  --  11.0* 10.5  NEUTROABS  --  5.9  --   HGB 8.1* 8.4* 8.2*  HCT  --  26.3* 25.9*  MCV  --  90.1 89.3  PLT  --  249 228   Recent Labs   Lab 03/11/17 1137 03/11/17 1648 03/11/17 2132 03/12/17 0748 03/12/17 1153  GLUCAP 118* 100* 111* 126* 101*    Recent Labs  Lab 03/09/17 0857  IRON 46  TIBC 200*  FERRITIN 292   Studies/Results: Dg Chest 2 View  Result Date: 03/11/2017 CLINICAL DATA:  Pneumonia. EXAM: CHEST  2 VIEW COMPARISON:  Chest x-ray dated March 09, 2017. FINDINGS: The patient is rotated to the right. The cardiomediastinal silhouette is unchanged. Patchy consolidation in the right middle and lower lobes with adjacent small pleural effusion, similar to prior study. Trace left pleural effusion is unchanged. No pneumothorax. No acute osseous abnormality. IMPRESSION: Right middle and lower lobe pneumonia with adjacent small pleural effusion, similar to prior study. Electronically Signed   By: Titus Dubin M.D.   On: 03/11/2017 09:06   Medications: . levofloxacin (LEVAQUIN) IV Stopped (03/12/17 3832)   . amLODipine  10 mg Oral Daily  . aspirin EC  325 mg Oral Daily  . atenolol  25 mg Oral Daily  . cholecalciferol  5,000 Units Oral Daily  . cloNIDine  0.2 mg Oral BID  . clopidogrel  75 mg Oral Daily  . ezetimibe  10 mg Oral Daily  . heparin  5,000 Units Subcutaneous Q8H  . insulin aspart  0-9 Units Subcutaneous TID WC  . latanoprost  1 drop Both Eyes QHS  . pantoprazole  40 mg Oral Daily  . PARoxetine  40 mg Oral Daily  . pravastatin  40 mg Oral QHS   . levofloxacin [facility]) IV Stopped (03/12/17 5396)   Background: 71 y.o. year-old who presented to ED on 11/17 after falling at home (tripped over the dog) and hitting her L side. L sided CP. In the ED was hypoxemic and CXR large RLL infiltrates c/w PNA.  No fever, not on home O2.  She has known CKD f/b Dr Marval Regal and just had LUA AV graft placed on 02/13/17 by Dr Oneida Alar. Asked to see for CKD IV.   Impression: 1. CKD stage V - eGFR very low, not grossly uremic and reluctant to start HD.  Had AVG placed on 10/23 which would be ok to use now if  needed. Had 2 doses of IV lasix 80, only 400 recorded out. Volume fair. Still will not commit to start of HD but I suspect will with a little more discussion.  2. PNA - RML/RLL - levaquin 3. HTN - on BB/ norvasc/ clonidine at home. BP's up some. Diurese.  4. 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  ECHO normal, some carotid plaquing. Neuro rec'd asa and plavix.   5. Anemia - start [place], MD [facility] [phone] pager 03/12/2017, 5:01 PM

## 2017-03-12 NOTE — Consult Note (Addendum)
   [facility] CM Inpatient Consult   03/12/2017  WANDY BOSSLER [birth year] [id number]    Patient screened for [place] Management services.  Explained [facility] Care Management services to Ms. Manner. She is agreeable and [facility] Care Management written consent obtained and packet provided.  Ms. Dearmond states she lives alone but her brother lives across the street. Her brother, Marella Bile assists with transportation to appointments as well. She will be new to outpatient HD. She states she thinks she will be going to [facility] dialysis center. Confirmed Primary Care Provider is [name]. Confirmed best contact number as her brother at [phone]. States she never has her phone on her. Reports she takes her medications as prescribed.  Goes to [facility] on [address].   Notification sent to Primary Care office contact person to make aware [place] Management to follow post discharge.   Asked Ms. Madan about her following up with her psychiatrist for depression. She states she has not made an appointment yet but plans to.  Will make referral to [facility] due to high risk for readmission. Will make referral to [facility] LCSW for follow up for depression as well. Ms. Blaize has [facility] Medicare. She has history for HLD, DM, GERD, CVA, CKD stage V.  Made inpatient RNCM aware of above notes.    Marthenia Rolling, MSN-Ed, RN,BSN [facility] [phone]

## 2017-03-13 DIAGNOSIS — F419 Anxiety disorder, unspecified: Secondary | ICD-10-CM

## 2017-03-13 LAB — RENAL FUNCTION PANEL
ALBUMIN: 2.6 g/dL — AB (ref 3.5–5.0)
ANION GAP: 9 (ref 5–15)
BUN: 51 mg/dL — ABNORMAL HIGH (ref 6–20)
CALCIUM: 8.1 mg/dL — AB (ref 8.9–10.3)
CO2: 18 mmol/L — ABNORMAL LOW (ref 22–32)
CREATININE: 6.93 mg/dL — AB (ref 0.44–1.00)
Chloride: 115 mmol/L — ABNORMAL HIGH (ref 101–111)
GFR, EST AFRICAN AMERICAN: 6 mL/min — AB (ref >60)
GFR, EST NON AFRICAN AMERICAN: 5 mL/min — AB (ref >60)
Glucose, Bld: 118 mg/dL — ABNORMAL HIGH (ref 65–99)
PHOSPHORUS: 5.5 mg/dL — AB (ref 2.5–4.6)
Potassium: 4 mmol/L (ref 3.5–5.1)
SODIUM: 142 mmol/L (ref 135–145)

## 2017-03-13 LAB — GLUCOSE, CAPILLARY
GLUCOSE-CAPILLARY: 131 mg/dL — AB (ref 65–99)
GLUCOSE-CAPILLARY: 176 mg/dL — AB (ref 65–99)
Glucose-Capillary: 113 mg/dL — ABNORMAL HIGH (ref 65–99)
Glucose-Capillary: 100 mg/dL — ABNORMAL HIGH (ref 65–99)

## 2017-03-13 LAB — CBC
HCT: 24.4 % — ABNORMAL LOW (ref 36.0–46.0)
HEMOGLOBIN: 7.8 g/dL — AB (ref 12.0–15.0)
MCH: 28.3 pg (ref 26.0–34.0)
MCHC: 32 g/dL (ref 30.0–36.0)
MCV: 88.4 fL (ref 78.0–100.0)
PLATELETS: 215 10*3/uL (ref 150–400)
RBC: 2.76 MIL/uL — AB (ref 3.87–5.11)
RDW: 15.6 % — ABNORMAL HIGH (ref 11.5–15.5)
WBC: 7 10*3/uL (ref 4.0–10.5)

## 2017-03-13 MED ORDER — LEVOFLOXACIN 500 MG PO TABS
500.0000 mg | ORAL_TABLET | ORAL | Status: AC
  Administered 2017-03-14: 500 mg via ORAL
  Filled 2017-03-13: qty 1

## 2017-03-13 MED ORDER — BISACODYL 5 MG PO TBEC: 5.0000 mg | DELAYED_RELEASE_TABLET | Freq: Every day | ORAL | Status: DC | PRN

## 2017-03-13 MED ORDER — POLYETHYLENE GLYCOL 3350 17 G PO PACK: 17.0000 g | PACK | Freq: Every day | ORAL | Status: DC | PRN

## 2017-03-13 MED ORDER — POLYETHYLENE GLYCOL 3350 17 G PO PACK
17.0000 g | PACK | Freq: Every day | ORAL | Status: DC
  Administered 2017-03-13 – 2017-03-16 (×4): 17 g via ORAL
  Filled 2017-03-13 (×4): qty 1

## 2017-03-13 MED ORDER — SODIUM BICARBONATE 650 MG PO TABS
650.0000 mg | ORAL_TABLET | Freq: Three times a day (TID) | ORAL | Status: DC
  Administered 2017-03-13 – 2017-03-16 (×9): 650 mg via ORAL
  Filled 2017-03-13 (×9): qty 1

## 2017-03-13 NOTE — Progress Notes (Signed)
PROGRESS NOTE    [name]  GYJ:[id number] DOB: 28-Jul-1945 DOA: 03/10/2017 PCP: [name], NP  Brief Narrative:71 y.o.femalewith medical history significant forhypertension, hyperlipidemia, diabetes, CKD stage V, not yet on dialysis, presenting to the ED with increasing shortness of breath, and nonproductive cough. This was preceded by a fall around November 7, landing on the left side, causing left-sided rib pain since that time, and hurting her when taking a deep breath. At the time, did not hit her head or lose consciousness. She denies any chest pain or palpitations. She denies rhinorrhea or hemoptysis. Denies subjective fever or chills. She denies any sick contacts, or recent long distance travel. Denies any abdominal pain. Has decreased appetite due to current symptoms.patient still making urine. She denies any dysuria or gross hematuria. Denies lower extremity swelling. No confusion was reported. Denies any vision changes.     Assessment & Plan:   Active Problems:   Hyperlipidemia   Depression   Essential hypertension   INSOMNIA UNSPECIFIED   GERD (gastroesophageal reflux disease) with acute nausea    DM (diabetes mellitus) type II controlled, neurological manifestation (HCC)   Tobacco abuse   Iron deficiency anemia due to chronic blood loss   Anxiety   Chronic knee pain   CKD (chronic kidney disease) stage 5, GFR less than 15 ml/min (HCC)   CVA (cerebral vascular accident) ([place])   Pneumonia   Hypoxia    Acute respiratory failure with hypoxia secondary to community-acquired pneumonia. Chest x-ray shows a right middle lobe and right lower lobe infiltrates. Patient was hypoxic upon arrival to the emergency room placed on 2 L of oxygen. And the saturation came up about 92%. Patient also had small bilateral pleural effusion. She was started on IV levoquin and nebulizers.last dose of levoquin 11/21.  end-stage renal disease patient is started on  dialysis 11 /24.Patient followed by [facility]. Patient has AV graft to the left upper extremity. History of fall and left rib fracture treat symptomatically.  hypertension continue atenolol and Norvasc.  Hyperlipidemia continue Pravachol.  History of CVA continue aspirin and Plavix  Anemia of chronic disease monitor hemoglobin  Type 2 diabetes patient carries a history of diabetes but not on any medications at home. Currently on SSI.  Hemoglobin A1c 5.7 this admission.     DVT prophylaxis: Heparin Code Status: Full code Family Communication: None  disposition Plan: tbd Consultants:  Nephrology Procedures:none Antimicrobials:levoquin  Subjective:no new complaints,says cough and breathing better. Objective:resting in bed  Vitals:   03/12/17 1647 03/12/17 2127 03/13/17 0411 03/13/17 0757  BP: (!) 151/71 (!) 146/69 (!) 157/73 (!) 157/75  Pulse: (!) 55 65 61 (!) 57  Resp: 16 16 12 16   Temp: 98.3 F (36.8 C) 98.3 F (36.8 C) 98.4 F (36.9 C) 98 F (36.7 C)  TempSrc: Oral   Oral  SpO2: 97% 97% 93% 100%  Weight:  73 kg (161 lb)    Height:        Intake/Output Summary (Last 24 hours) at 03/13/2017 1324 Last data filed at 03/13/2017 0945 Gross per 24 hour  Intake 340 ml  Output 400 ml  Net -60 ml   Filed Weights   03/11/17 2115 03/12/17 0830 03/12/17 2127  Weight: 75 kg (165 lb 5.5 oz) 75 kg (165 lb 5.5 oz) 73 kg (161 lb)    Examination:  General exam: Appears calm and comfortable  Respiratory system: Clear to auscultation. Respiratory effort normal. Cardiovascular system: S1 & S2 heard, RRR. No JVD,  murmurs, rubs, gallops or clicks. No pedal edema. Gastrointestinal system: Abdomen is nondistended, soft and nontender. No organomegaly or masses felt. Normal bowel sounds heard. Central nervous system: Alert and oriented. No focal neurological deficits. Extremities: Symmetric 5 x 5 power. Skin: No rashes, lesions or ulcers Psychiatry: Judgement and  insight appear normal. Mood & affect appropriate.     Data Reviewed: I have personally reviewed following labs and imaging studies  CBC: Recent Labs  Lab 03/09/17 0908 03/09/17 2219 03/11/17 0327 03/13/17 0600  WBC  --  11.0* 10.5 7.0  NEUTROABS  --  5.9  --   --   HGB 8.1* 8.4* 8.2* 7.8*  HCT  --  26.3* 25.9* 24.4*  MCV  --  90.1 89.3 88.4  PLT  --  249 228 130   Basic Metabolic Panel: Recent Labs  Lab 03/09/17 0857 03/09/17 2219 03/11/17 0327 03/12/17 0455 03/13/17 0600  NA 141 143 143 143 142  K 5.2* 5.1 5.5* 4.1 4.0  CL 117* 119* 120* 117* 115*  CO2 16* 16* 15* 16* 18*  GLUCOSE 120* 103* 129* 131* 118*  BUN 55* 55* 54* 50* 51*  CREATININE 6.50* 6.49* 6.42* 6.67* 6.93*  CALCIUM 8.0*  7.9* 7.8* 8.2* 8.2* 8.1*  PHOS 5.4*  --   --   --  5.5*   GFR: Estimated Creatinine Clearance: 7.5 mL/min (A) (by C-G formula based on SCr of 6.93 mg/dL (H)). Liver Function Tests: Recent Labs  Lab 03/09/17 0857 03/09/17 2219 03/11/17 0327 03/13/17 0600  AST  --  19 15  --   ALT  --  19 17  --   ALKPHOS  --  93 87  --   BILITOT  --  0.7 0.5  --   PROT  --  6.0* 5.8*  --   ALBUMIN 3.0* 3.1* 2.8* 2.6*   No results for input(s): LIPASE, AMYLASE in the last 168 hours. No results for input(s): AMMONIA in the last 168 hours. Coagulation Profile: No results for input(s): INR, PROTIME in the last 168 hours. Cardiac Enzymes: No results for input(s): CKTOTAL, CKMB, CKMBINDEX, TROPONINI in the last 168 hours. BNP (last 3 results) No results for input(s): PROBNP in the last 8760 hours. HbA1C: No results for input(s): HGBA1C in the last 72 hours. CBG: Recent Labs  Lab 03/12/17 1153 03/12/17 1644 03/12/17 2124 03/13/17 0742 03/13/17 1145  GLUCAP 101* 96 98 113* 131*   Lipid Profile: No results for input(s): CHOL, HDL, LDLCALC, TRIG, CHOLHDL, LDLDIRECT in the last 72 hours. Thyroid Function Tests: No results for input(s): TSH, T4TOTAL, FREET4, T3FREE, THYROIDAB in the  last 72 hours. Anemia Panel: No results for input(s): VITAMINB12, FOLATE, FERRITIN, TIBC, IRON, RETICCTPCT in the last 72 hours. Sepsis Labs: Recent Labs  Lab 03/09/17 2231 03/10/17 0613 03/10/17 1045  PROCALCITON  --   --  0.10  LATICACIDVEN 1.28 0.81 1.2  1.2    Recent Results (from the past 240 hour(s))  Blood culture (routine x 2)     Status: None (Preliminary result)   Collection Time: 03/10/17  5:46 AM  Result Value Ref Range Status   Specimen Description BLOOD RIGHT ARM  Final   Special Requests   Final    BOTTLES DRAWN AEROBIC AND ANAEROBIC Blood Culture results may not be optimal due to an excessive volume of blood received in culture bottles   Culture NO GROWTH 3 DAYS  Final   Report Status PENDING  Incomplete  Blood culture (routine x 2)  Status: None (Preliminary result)   Collection Time: 03/10/17  6:05 AM  Result Value Ref Range Status   Specimen Description BLOOD RIGHT ARM  Final   Special Requests IN PEDIATRIC BOTTLE Blood Culture adequate volume  Final   Culture NO GROWTH 3 DAYS  Final   Report Status PENDING  Incomplete         Radiology Studies: No results found.      Scheduled Meds: . amLODipine  10 mg Oral Daily  . aspirin EC  325 mg Oral Daily  . atenolol  25 mg Oral Daily  . cholecalciferol  5,000 Units Oral Daily  . cloNIDine  0.2 mg Oral BID  . clopidogrel  75 mg Oral Daily  . darbepoetin (ARANESP) injection - NON-DIALYSIS  100 mcg Subcutaneous Q Mon-1800  . ezetimibe  10 mg Oral Daily  . heparin  5,000 Units Subcutaneous Q8H  . insulin aspart  0-9 Units Subcutaneous TID WC  . latanoprost  1 drop Both Eyes QHS  . [START ON 03/14/2017] levofloxacin  500 mg Oral Q48H  . pantoprazole  40 mg Oral Daily  . PARoxetine  40 mg Oral Daily  . pravastatin  40 mg Oral QHS   Continuous Infusions:   LOS: 2 days       Georgette Shell, MD [facility]  If 7PM-7AM, please contact night-coverage [email or web address] Password  [email or web address] 03/13/2017, 1:24 PM

## 2017-03-13 NOTE — Progress Notes (Addendum)
[facility] Rounding Note  Subjective/Interval History:   Can't get her to commit to HD Wants to go home first and get things settled there with her dog  Denies SOB, nausea Appetite OK  Objective Vital signs in last 24 hours: Vitals:   03/12/17 1647 03/12/17 2127 03/13/17 0411 03/13/17 0757  BP: (!) 151/71 (!) 146/69 (!) 157/73 (!) 157/75  Pulse: (!) 55 65 61 (!) 57  Resp: _0 Temp: 98.3 F (36.8 C) 98.3 F (36.8 C) 98.4 F (36.9 C) 98 F (36.7 C)  TempSrc: Oral   Oral  SpO2: 97% 97% 93% 100%  Weight:  73 kg (161 lb)    Height:       Weight change: 0 kg (0 lb)  Intake/Output Summary (Last 24 hours) at 03/13/2017 1325 Last data filed at 03/13/2017 0945 Gross per 24 hour  Intake 340 ml  Output 400 ml  Net -60 ml   Physical Exam:  Blood pressure (!) 157/75, pulse (!) 57, temperature 98 F (36.7 C), temperature source Oral, resp. rate 16, height _1  (1.651 m), weight 73 kg (161 lb), SpO2 100 %.  Lying in bed, NAD +JVD approx 6 cm , no bruits Chest anteriorly coarse BS RRR no MRG No rub Abd soft ntnd no mass or ascites Large pannus Ext trace bilat pretib edema Neuro is alert, Ox 3 No asterixus Left upper arm AVG incisions healed, + bruit (10/23)   Recent Labs  Lab 03/09/17 0857 03/09/17 2219 03/11/17 0327 03/12/17 0455 03/13/17 0600  NA 141 143 143 143 142  K 5.2* 5.1 5.5* 4.1 4.0  CL 117* 119* 120* 117* 115*  CO2 16* 16* 15* 16* 18*  GLUCOSE 120* 103* 129* 131* 118*  BUN 55* 55* 54* 50* 51*  CREATININE 6.50* 6.49* 6.42* 6.67* 6.93*  CALCIUM 8.0*  7.9* 7.8* 8.2* 8.2* 8.1*  PHOS 5.4*  --   --   --  5.5*    Recent Labs  Lab 03/09/17 2219 03/11/17 0327 03/13/17 0600  AST 19 15  --   ALT 19 17  --   ALKPHOS 93 87  --   BILITOT 0.7 0.5  --   PROT 6.0* 5.8*  --   ALBUMIN 3.1* 2.8* 2.6*    Recent Labs  Lab 03/09/17 0908 03/09/17 2219 03/11/17 0327 03/13/17 0600  WBC  --  11.0* 10.5 7.0  NEUTROABS  --  5.9  --   --   HGB 8.1* 8.4* 8.2*  7.8*  HCT  --  26.3* 25.9* 24.4*  MCV  --  90.1 89.3 88.4  PLT  --  249 228 215   Recent Labs  Lab 03/12/17 1153 03/12/17 1644 03/12/17 2124 03/13/17 0742 03/13/17 1145  GLUCAP 101* 96 98 113* 131*    Recent Labs  Lab 03/09/17 0857  IRON 46  TIBC 200*  FERRITIN 292   Medications:  . amLODipine  10 mg Oral Daily  . aspirin EC  325 mg Oral Daily  . atenolol  25 mg Oral Daily  . cholecalciferol  5,000 Units Oral Daily  . cloNIDine  0.2 mg Oral BID  . clopidogrel  75 mg Oral Daily  . darbepoetin (ARANESP) injection - NON-DIALYSIS  100 mcg Subcutaneous Q Mon-1800  . ezetimibe  10 mg Oral Daily  . heparin  5,000 Units Subcutaneous Q8H  . insulin aspart  0-9 Units Subcutaneous TID WC  . latanoprost  1 drop Both Eyes QHS  . [START ON 03/14/2017]  levofloxacin  500 mg Oral Q48H  . pantoprazole  40 mg Oral Daily  . PARoxetine  40 mg Oral Daily  . pravastatin  40 mg Oral QHS      Background: 71 y.o. year-old who presented to ED on 11/17 after falling at home (tripped over the dog) and hitting her L side. L sided CP. In the ED was hypoxemic and CXR large RLL infiltrates c/w PNA.  No fever, not on home O2.  She has known CKD f/b Dr Marval Regal and just had LUA AV graft placed on 02/13/17 by Dr Oneida Alar. Asked to see for CKD V.   Impression: 1. CKD stage V - eGFR very low, not grossly uremic and still reluctant/declines to start HD. AVG placed 10/23 looks OK for use. Wants to "get things straightened out at home first" and unless "1 foot in the grave and the other one on a banana peel" won't start yet 2. PNA - RML/RLL - levaquin through 11/23 3. HTN - on atenolol/ norvasc/ clonidine at home. 4. 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 ECHO normal, some carotid plaquing. Neuro rec'd asa and plavix.   5. Anemia - started ESA 100 mcg Aranesp dosed 11/19. TSat 23% Fe 46 11/16. No IV Fe with active PNA 6. Metabolic acidosis - starting oral Na bicarb  since pt not agreeing to start HD 7. Pt does have appt with Dr. Marval Regal at [facility] Dec 6 at 7:45 AM in the event that she continues to refuse to start HD  Jamal Maes, MD [facility] [phone] pager 03/13/2017, 1:25 PM

## 2017-03-13 NOTE — Progress Notes (Signed)
Pharmacy Antibiotic Note  [name] is a 72 y.o. female admitted on 03/10/2017 with pneumonia.  Pharmacy has been consulted for Levaquin dosing.  Today is day #4 of antibiotic and patient is clinically improving.  Will change to PO.  Plan: Levaquin 500mg  PO q48h.  Typical length of therapy for pneumonia is 5-7 days.  Consider stop date of 11/21 - 11/23 depending on patient progress. Pharmacy will sign off.  Height: 5\' 5"  (165.1 cm) Weight: 161 lb (73 kg) IBW/kg (Calculated) : 57  Temp (24hrs), Avg:98.3 F (36.8 C), Min:98 F (36.7 C), Max:98.4 F (36.9 C)  Recent Labs  Lab 03/09/17 0857 03/09/17 2219 03/09/17 2231 03/10/17 0613 03/10/17 1045 03/11/17 0327 03/12/17 0455 03/13/17 0600  WBC  --  11.0*  --   --   --  10.5  --  7.0  CREATININE 6.50* 6.49*  --   --   --  6.42* 6.67* 6.93*  LATICACIDVEN  --   --  1.28 0.81 1.2  1.2  --   --   --     Estimated Creatinine Clearance: 7.5 mL/min (A) (by C-G formula based on SCr of 6.93 mg/dL (H)).    Allergies  Allergen Reactions  . Lisinopril Other (See Comments)    Abnormal Kidney Function   . Pioglitazone Other (See Comments)    REACTION: Desquamation of skin of the palm  . Morphine And Related Nausea And Vomiting  . Sulfonamide Derivatives Rash    Antimicrobials this admission: Levaquin 11/17 >>  Dose adjustments this admission:  Microbiology results: 11/17 BCx ngtd  Thank you for allowing pharmacy to be a part of this patient's care.  [employer], Pharm.D., BCPS Clinical Pharmacist Pager: [phone] Clinical phone for 03/13/2017 from 8:30-4:00 is [phone]. After 4pm, please call Main Rx [phone]) for assistance. 03/13/2017 11:35 AM

## 2017-03-13 NOTE — Progress Notes (Signed)
Occupational Therapy Treatment Patient Details Name: [name] MRN: [id number] DOB: 09/15/1945 Today's Date: 03/13/2017    History of present illness [name] is a 71 y.o. female with medical history significant for hypertension, hyperlipidemia, diabetes, CKD stage V, not yet on dialysis, presented to the ED with increasing shortness of breath, and nonproductive cough. This was preceded by a fall around November 7 with rib pain.    OT comments  Pt demonstrates improved activity tolerance this date - she demonstrates DOE 1/4, and did not require rest breaks.  Able to complete ADLs with supervision.  Will follow.   Follow Up Recommendations  No OT follow up;Supervision - Intermittent    Equipment Recommendations  None recommended by OT    Recommendations for Other Services      Precautions / Restrictions Precautions Precautions: Fall;Other (comment) Precaution Comments: watch sats       Mobility Bed Mobility Overal bed mobility: Modified Independent                Transfers Overall transfer level: Needs assistance Equipment used: None Transfers: Sit to/from Stand;Stand Pivot Transfers Sit to Stand: Modified independent (Device/Increase time) Stand pivot transfers: Modified independent (Device/Increase time)            Balance Overall balance assessment: Needs assistance Sitting-balance support: No upper extremity supported Sitting balance-Leahy Scale: Good     Standing balance support: No upper extremity supported;During functional activity Standing balance-Leahy Scale: Fair                             ADL either performed or assessed with clinical judgement   ADL Overall ADL's : Needs assistance/impaired Eating/Feeding: Independent   Grooming: Wash/dry hands;Wash/dry face;Oral care;Brushing hair;Supervision/safety;Standing                   Toilet Transfer: Supervision/safety;Ambulation;Comfort height toilet;Grab bars    Toileting- Clothing Manipulation and Hygiene: Supervision/safety;Sit to/from stand       Functional mobility during ADLs: Supervision/safety General ADL Comments: Pt requiring fewer rest breaks today      Vision       Perception     Praxis      Cognition Arousal/Alertness: Awake/alert Behavior During Therapy: WFL for tasks assessed/performed Overall Cognitive Status: Within Functional Limits for tasks assessed                                          Exercises     Shoulder Instructions       General Comments DOE 1/4 today     Pertinent Vitals/ Pain          Home Living                                          Prior Functioning/Environment              Frequency  Min 2X/week        Progress Toward Goals  OT Goals(current goals can now be found in the care plan section)  Progress towards OT goals: Progressing toward goals     Plan Discharge plan remains appropriate    Co-evaluation                 AM-PAC  PT "6 Clicks" Daily Activity     Outcome Measure   Help from another person eating meals?: None Help from another person taking care of personal grooming?: A Little Help from another person toileting, which includes using toliet, bedpan, or urinal?: A Little Help from another person bathing (including washing, rinsing, drying)?: A Little Help from another person to put on and taking off regular upper body clothing?: A Little Help from another person to put on and taking off regular lower body clothing?: A Little 6 Click Score: 19    End of Session    OT Visit Diagnosis: Unsteadiness on feet (R26.81)   Activity Tolerance Patient tolerated treatment well   Patient Left in bed;with call bell/phone within reach;with bed alarm set   Nurse Communication Mobility status        Time: 5003-7048 OT Time Calculation (min): 15 min  Charges: OT General Charges $OT Visit: 1 Visit OT  Treatments $Therapeutic Activity: 8-22 mins  [facility], OTR/L [phone]    Lucille Passy M 03/13/2017, 2:36 PM

## 2017-03-13 NOTE — Care Management Note (Signed)
Case Management Note  Patient Details  Name: [name] MRN: [id number] Date of Birth: Oct 21, 1945  Subjective/Objective:      CM following for progression and d/c planning. Pt lives in a house , alone with two dogs, a large retriever/beagle mix and a small dog. She states that she normally walks the larger dog 3x per day for some distance ( up to 2 miles per day, per pt). She also states that this dog has caused her to fall several time and she realizes that she must find another home for him. Pt is mourning the loss of this dog and repeatedly states that "they won't let me keep him". This CM attempted to explain to this pt that no one can make this decision for her, however she may have a more serious injury if this dog continues to make her fall. Pt was encouraged to continue to walk , she will not be told to not walk but she needs to be aware of the risk with her dog.  Pt lives across the street from her brother, she lost both of her elderly parents this year ( both in their 43s, her father was on HD). She states that her brother could provide transportation if she stated HD in the community.  Pt has no other family support, however does have neighbors/friends.              Action/Plan: 03/13/2017 Met with pt and discussed needs as described above. Will follow and arrange [place] or DME if need identified.   Expected Discharge Date:                  Expected Discharge Plan:  Home/Self Care  In-House Referral:  NA  Discharge planning Services  CM Consult  Post Acute Care Choice:  NA Choice offered to:  NA  DME Arranged:    DME Agency:     HH Arranged:    HH Agency:     Status of Service:  In process, will continue to follow  If discussed at Long Length of Stay Meetings, dates discussed:    Additional Comments:  Adron Bene, RN 03/13/2017, 12:09 PM

## 2017-03-14 ENCOUNTER — Inpatient Hospital Stay (HOSPITAL_COMMUNITY): Payer: Medicare HMO

## 2017-03-14 DIAGNOSIS — M25561 Pain in right knee: Secondary | ICD-10-CM

## 2017-03-14 DIAGNOSIS — R0603 Acute respiratory distress: Secondary | ICD-10-CM

## 2017-03-14 DIAGNOSIS — D500 Iron deficiency anemia secondary to blood loss (chronic): Secondary | ICD-10-CM

## 2017-03-14 DIAGNOSIS — G8929 Other chronic pain: Secondary | ICD-10-CM

## 2017-03-14 LAB — GLUCOSE, CAPILLARY
GLUCOSE-CAPILLARY: 133 mg/dL — AB (ref 65–99)
GLUCOSE-CAPILLARY: 97 mg/dL (ref 65–99)
GLUCOSE-CAPILLARY: 99 mg/dL (ref 65–99)
GLUCOSE-CAPILLARY: 127 mg/dL — AB (ref 65–99)

## 2017-03-14 LAB — RENAL FUNCTION PANEL
ALBUMIN: 2.4 g/dL — AB (ref 3.5–5.0)
ANION GAP: 9 (ref 5–15)
BUN: 51 mg/dL — ABNORMAL HIGH (ref 6–20)
CO2: 19 mmol/L — ABNORMAL LOW (ref 22–32)
Calcium: 8 mg/dL — ABNORMAL LOW (ref 8.9–10.3)
Chloride: 112 mmol/L — ABNORMAL HIGH (ref 101–111)
Creatinine, Ser: 6.76 mg/dL — ABNORMAL HIGH (ref 0.44–1.00)
GFR calc Af Amer: 6 mL/min — ABNORMAL LOW (ref >60)
GFR calc non Af Amer: 5 mL/min — ABNORMAL LOW (ref >60)
Glucose, Bld: 127 mg/dL — ABNORMAL HIGH (ref 65–99)
Phosphorus: 5.2 mg/dL — ABNORMAL HIGH (ref 2.5–4.6)
Potassium: 4 mmol/L (ref 3.5–5.1)
Sodium: 140 mmol/L (ref 135–145)

## 2017-03-14 MED ORDER — LIDOCAINE 2% (20 MG/ML) 5 ML SYRINGE
INTRAMUSCULAR | Status: AC
  Filled 2017-03-14: qty 10

## 2017-03-14 NOTE — Progress Notes (Signed)
Patient presented to IR for possible thoracentesis.   Limited US Chest performed; results dictated separately.  Survey for fluid shows only a small/trace amount which is not amenable to thoracentesis.   Notified ordering MD.   Brynda Greathouse, MS RD PA-C 3:55 PM

## 2017-03-14 NOTE — Progress Notes (Addendum)
PROGRESS NOTE    [name]  CVE:[id number] DOB: 09/13/1945 DOA: 03/10/2017 PCP: [name], NP  Brief Narrative:71 y.o.femalewith medical history significant forhypertension, hyperlipidemia, diabetes, CKD stage V, not yet on dialysis, presenting to the ED with increasing shortness of breath, and nonproductive cough. This was preceded by a fall around November 7, landing on the left side, causing left-sided rib pain since that time, and hurting her when taking a deep breath. At the time, did not hit her head or lose consciousness. She denies any chest pain or palpitations. She denies rhinorrhea or hemoptysis. Denies subjective fever or chills. She denies any sick contacts, or recent long distance travel. Denies any abdominal pain. Has decreased appetite due to current symptoms.patient still making urine. She denies any dysuria or gross hematuria. Denies lower extremity swelling. No confusion was reported. Denies any vision changes.   Pt seen and examined at her bedside. She reports persistent dyspnea worse when she ambulates associated with a productive cough and clear to white mucus. Denies chest pain or palpitations. cxr ordered and reviewed with increase in pulmonary vascularity and right pleural effusion. Thoracenteseis (R) ordered.    Assessment & Plan:   Active Problems:   Hyperlipidemia   Depression   Essential hypertension   INSOMNIA UNSPECIFIED   GERD (gastroesophageal reflux disease) with acute nausea    DM (diabetes mellitus) type II controlled, neurological manifestation (HCC)   Tobacco abuse   Iron deficiency anemia due to chronic blood loss   Anxiety   Chronic knee pain   CKD (chronic kidney disease) stage 5, GFR less than 15 ml/min (HCC)   CVA (cerebral vascular accident) ([place])   Pneumonia   Hypoxia    Acute respiratory failure with hypoxia secondary to community-acquired pneumonia, prsent on admission.  -Chest x-ray 03/14/17 shows increased  pulm vascularity and right pleural effusion -hypoxic upon arrival to the emergency room placed on 2 L of oxygen -O2 supplementation to maintain O2 sat 92% or greater -wean off O2 supplement as tolerated -not on O2 at home -IV levaquin and nebulizers -Right thoracentesis ordered  Anuric in the setting of CKD stage V -nephrology following. +1300 fluid balance -avoid hypotension-Not hypotensive -possible dialysis 03/17/17 -AV graft to the left upper extremity  History of fall and left rib fracture  -treat symptomatically -PT to evaluate and treat   hypertension  -continue atenolol and Norvasc.  Hyperlipidemia  -continue Pravachol, zetia.  History of CVA  -continue aspirin and Plavix  Anemia of chronic disease  -stable -CBC am  Type 2 diabetes  -Currently on SSI -hypoglycemic protocol -Hemoglobin A1c 5.7  Depression/anxiety -paroxetine  GERD -protonix  DVT prophylaxis: Heparin sq 5000 U TID Code Status: Full code Family Communication: No family members at bedside disposition Plan: will stay another midnight to continue diuresing and treatment for CAP, present on admission Consultants:  Nephrology Procedures:none Antimicrobials:levaquin   Vitals:   03/13/17 0757 03/13/17 1628 03/13/17 2036 03/14/17 0539  BP: (!) 157/75 (!) 142/56 134/64 (!) 147/75  Pulse: (!) 57 (!) 57 (!) 54 (!) 59  Resp: 16 17 18 18   Temp: 98 F (36.7 C) 97.7 F (36.5 C) 97.7 F (36.5 C) 98 F (36.7 C)  TempSrc: Oral Oral Oral Oral  SpO2: 100% 98% 99% 97%  Weight:   73.1 kg (161 lb 3.2 oz)   Height:        Intake/Output Summary (Last 24 hours) at 03/14/2017 0755 Last data filed at 03/14/2017 0200 Gross per 24 hour  Intake  340 ml  Output 0 ml  Net 340 ml   Filed Weights   03/12/17 0830 03/12/17 2127 03/13/17 2036  Weight: 75 kg (165 lb 5.5 oz) 73 kg (161 lb) 73.1 kg (161 lb 3.2 oz)    Examination:  General exam: 5 AAF wd wn NAD. A&o x3 Respiratory system: Clear to  auscultation. Respiratory effort normal. Cardiovascular system: S1 & S2 heard, RRR. No JVD, murmurs, rubs, gallops or clicks. No pedal edema. Gastrointestinal system: Abdomen is nondistended, soft and nontender. No organomegaly or masses felt. Normal bowel sounds heard. Central nervous system: Alert and oriented. No focal neurological deficits. Extremities: Symmetric 5 x 5 power. Skin: No rashes, lesions or ulcers Psychiatry: Judgement and insight appear normal. Mood & affect appropriate.     Data Reviewed: I have personally reviewed following labs and imaging studies  CBC: Recent Labs  Lab 03/09/17 0908 03/09/17 2219 03/11/17 0327 03/13/17 0600  WBC  --  11.0* 10.5 7.0  NEUTROABS  --  5.9  --   --   HGB 8.1* 8.4* 8.2* 7.8*  HCT  --  26.3* 25.9* 24.4*  MCV  --  90.1 89.3 88.4  PLT  --  249 228 130   Basic Metabolic Panel: Recent Labs  Lab 03/09/17 0857 03/09/17 2219 03/11/17 0327 03/12/17 0455 03/13/17 0600 03/14/17 0554  NA 141 143 143 143 142 140  K 5.2* 5.1 5.5* 4.1 4.0 4.0  CL 117* 119* 120* 117* 115* 112*  CO2 16* 16* 15* 16* 18* 19*  GLUCOSE 120* 103* 129* 131* 118* 127*  BUN 55* 55* 54* 50* 51* 51*  CREATININE 6.50* 6.49* 6.42* 6.67* 6.93* 6.76*  CALCIUM 8.0*  7.9* 7.8* 8.2* 8.2* 8.1* 8.0*  PHOS 5.4*  --   --   --  5.5* 5.2*   GFR: Estimated Creatinine Clearance: 7.6 mL/min (A) (by C-G formula based on SCr of 6.76 mg/dL (H)). Liver Function Tests: Recent Labs  Lab 03/09/17 0857 03/09/17 2219 03/11/17 0327 03/13/17 0600 03/14/17 0554  AST  --  19 15  --   --   ALT  --  19 17  --   --   ALKPHOS  --  93 87  --   --   BILITOT  --  0.7 0.5  --   --   PROT  --  6.0* 5.8*  --   --   ALBUMIN 3.0* 3.1* 2.8* 2.6* 2.4*   No results for input(s): LIPASE, AMYLASE in the last 168 hours. No results for input(s): AMMONIA in the last 168 hours. Coagulation Profile: No results for input(s): INR, PROTIME in the last 168 hours. Cardiac Enzymes: No results for  input(s): CKTOTAL, CKMB, CKMBINDEX, TROPONINI in the last 168 hours. BNP (last 3 results) No results for input(s): PROBNP in the last 8760 hours. HbA1C: No results for input(s): HGBA1C in the last 72 hours. CBG: Recent Labs  Lab 03/12/17 2124 03/13/17 0742 03/13/17 1145 03/13/17 1625 03/13/17 2035  GLUCAP 98 113* 131* 100* 176*   Lipid Profile: No results for input(s): CHOL, HDL, LDLCALC, TRIG, CHOLHDL, LDLDIRECT in the last 72 hours. Thyroid Function Tests: No results for input(s): TSH, T4TOTAL, FREET4, T3FREE, THYROIDAB in the last 72 hours. Anemia Panel: No results for input(s): VITAMINB12, FOLATE, FERRITIN, TIBC, IRON, RETICCTPCT in the last 72 hours. Sepsis Labs: Recent Labs  Lab 03/09/17 2231 03/10/17 0613 03/10/17 1045  PROCALCITON  --   --  0.10  LATICACIDVEN 1.28 0.81 1.2  1.2  Recent Results (from the past 240 hour(s))  Blood culture (routine x 2)     Status: None (Preliminary result)   Collection Time: 03/10/17  5:46 AM  Result Value Ref Range Status   Specimen Description BLOOD RIGHT ARM  Final   Special Requests   Final    BOTTLES DRAWN AEROBIC AND ANAEROBIC Blood Culture results may not be optimal due to an excessive volume of blood received in culture bottles   Culture NO GROWTH 3 DAYS  Final   Report Status PENDING  Incomplete  Blood culture (routine x 2)     Status: None (Preliminary result)   Collection Time: 03/10/17  6:05 AM  Result Value Ref Range Status   Specimen Description BLOOD RIGHT ARM  Final   Special Requests IN PEDIATRIC BOTTLE Blood Culture adequate volume  Final   Culture NO GROWTH 3 DAYS  Final   Report Status PENDING  Incomplete         Radiology Studies: No results found.      Scheduled Meds: . amLODipine  10 mg Oral Daily  . aspirin EC  325 mg Oral Daily  . atenolol  25 mg Oral Daily  . cholecalciferol  5,000 Units Oral Daily  . cloNIDine  0.2 mg Oral BID  . clopidogrel  75 mg Oral Daily  . darbepoetin  (ARANESP) injection - NON-DIALYSIS  100 mcg Subcutaneous Q Mon-1800  . ezetimibe  10 mg Oral Daily  . heparin  5,000 Units Subcutaneous Q8H  . insulin aspart  0-9 Units Subcutaneous TID WC  . latanoprost  1 drop Both Eyes QHS  . levofloxacin  500 mg Oral Q48H  . pantoprazole  40 mg Oral Daily  . PARoxetine  40 mg Oral Daily  . polyethylene glycol  17 g Oral Daily  . pravastatin  40 mg Oral QHS  . sodium bicarbonate  650 mg Oral TID   Continuous Infusions:   LOS: 3 days       Kayleen Memos, MD [facility]  If 7PM-7AM, please contact night-coverage [email or web address] Password [facility] 03/14/2017, 7:55 AM

## 2017-03-14 NOTE — Progress Notes (Signed)
Physical Therapy Treatment Patient Details Name: [name] MRN: [id number] DOB: 1945/10/05 Today's Date: 03/14/2017    History of Present Illness [name] is a 71 y.o. female with medical history significant for hypertension, hyperlipidemia, diabetes, CKD stage V, not yet on dialysis, presented to the ED with increasing shortness of breath, and nonproductive cough. This was preceded by a fall around November 7 with rib pain.     PT Comments    Pt making steady progress towards achieving her current functional mobility goals. Pt would continue to benefit from skilled physical therapy services at this time while admitted and after d/c to address the below listed limitations in order to improve overall safety and independence with functional mobility.    Follow Up Recommendations  Home health PT;Supervision - Intermittent     Equipment Recommendations  None recommended by PT    Recommendations for Other Services       Precautions / Restrictions Precautions Precautions: Fall;Other (comment) Precaution Comments: watch sats    Mobility  Bed Mobility Overal bed mobility: Modified Independent                Transfers Overall transfer level: Needs assistance Equipment used: None Transfers: Sit to/from Stand Sit to Stand: Supervision         General transfer comment: supervision for safety only, increased time to stabilize initial standing balance  Ambulation/Gait Ambulation/Gait assistance: Supervision Ambulation Distance (Feet): 250 Feet Assistive device: Rolling walker (2 wheeled) Gait Pattern/deviations: Step-through pattern;Decreased stride length Gait velocity: decreased Gait velocity interpretation: Below normal speed for age/gender General Gait Details: pt ambulated on RA with SPO2 maintaining >92% throughout; no instability or LOB, supervision for safety   Stairs            Wheelchair Mobility    Modified Rankin (Stroke Patients  Only)       Balance Overall balance assessment: Needs assistance Sitting-balance support: No upper extremity supported Sitting balance-Leahy Scale: Good     Standing balance support: During functional activity Standing balance-Leahy Scale: Fair                              Cognition Arousal/Alertness: Awake/alert Behavior During Therapy: WFL for tasks assessed/performed Overall Cognitive Status: Within Functional Limits for tasks assessed                                        Exercises      General Comments        Pertinent Vitals/Pain Pain Assessment: No/denies pain    Home Living                      Prior Function            PT Goals (current goals can now be found in the care plan section) Acute Rehab PT Goals PT Goal Formulation: With patient Time For Goal Achievement: 03/18/17 Potential to Achieve Goals: Good Progress towards PT goals: Progressing toward goals    Frequency    Min 3X/week      PT Plan Current plan remains appropriate    Co-evaluation              AM-PAC PT "6 Clicks" Daily Activity  Outcome Measure  Difficulty turning over in bed (including adjusting bedclothes, sheets and blankets)?: None Difficulty moving from lying  on back to sitting on the side of the bed? : None Difficulty sitting down on and standing up from a chair with arms (e.g., wheelchair, bedside commode, etc,.)?: A Little Help needed moving to and from a bed to chair (including a wheelchair)?: None Help needed walking in hospital room?: A Little Help needed climbing 3-5 steps with a railing? : A Little 6 Click Score: 21    End of Session   Activity Tolerance: Patient tolerated treatment well Patient left: in bed;with call bell/phone within reach;with bed alarm set Nurse Communication: Mobility status PT Visit Diagnosis: Difficulty in walking, not elsewhere classified (R26.2);Muscle weakness (generalized) (M62.81)      Time: 3335-4562 PT Time Calculation (min) (ACUTE ONLY): 11 min  Charges:  $Gait Training: 8-22 mins                    G Codes:       [place], [place], [place] 03/14/2017, 10:30 AM

## 2017-03-14 NOTE — Progress Notes (Signed)
[facility] Rounding Note Subjective/Interval History:   Refuses to start dialysis now Says after her December appt upcoming with Dr. Marval Regal will agree CXR today unchanged  Objective Vital signs in last 24 hours: Vitals:   03/13/17 1628 03/13/17 2036 03/14/17 0539 03/14/17 0927  BP: (!) 142/56 134/64 (!) 147/75 (!) 143/71  Pulse: (!) 57 (!) 54 (!) 59 62  Resp: _0 Temp: 97.7 F (36.5 C) 97.7 F (36.5 C) 98 F (36.7 C) (!) 97.5 F (36.4 C)  TempSrc: Oral Oral Oral Oral  SpO2: 98% 99% 97% 100%  Weight:  73.1 kg (161 lb 3.2 oz)    Height:       Weight change: -1.88 kg (-2.3 oz)  Intake/Output Summary (Last 24 hours) at 03/14/2017 1225 Last data filed at 03/14/2017 1000 Gross per 24 hour  Intake 180 ml  Output 0 ml  Net 180 ml   Physical Exam:  Blood pressure (!) 143/71, pulse 62, temperature (!) 97.5 F (36.4 C), temperature source Oral, resp. rate 17, height 5' 5" (1.651 m), weight 73.1 kg (161 lb 3.2 oz), SpO2 100 %.  Lying in bed, NAD +JVD approx 6 cm , no bruits Chest anteriorly coarse BS RRR no MRG No rub Abd soft ntnd no mass or ascites Large pannus Ext trace bilat pretib edema Neuro is alert, Ox 3 No asterixus Left upper arm AVG incisions healed, + bruit (10/23)   Recent Labs  Lab 03/09/17 0857 03/09/17 2219 03/11/17 0327 03/12/17 0455 03/13/17 0600 03/14/17 0554  NA 141 143 143 143 142 140  K 5.2* 5.1 5.5* 4.1 4.0 4.0  CL 117* 119* 120* 117* 115* 112*  CO2 16* 16* 15* 16* 18* 19*  GLUCOSE 120* 103* 129* 131* 118* 127*  BUN 55* 55* 54* 50* 51* 51*  CREATININE 6.50* 6.49* 6.42* 6.67* 6.93* 6.76*  CALCIUM 8.0*  7.9* 7.8* 8.2* 8.2* 8.1* 8.0*  PHOS 5.4*  --   --   --  5.5* 5.2*    Recent Labs  Lab 03/09/17 2219 03/11/17 0327 03/13/17 0600 03/14/17 0554  AST 19 15  --   --   ALT 19 17  --   --   ALKPHOS 93 87  --   --   BILITOT 0.7 0.5  --   --   PROT 6.0* 5.8*  --   --   ALBUMIN 3.1* 2.8* 2.6* 2.4*    Recent Labs  Lab  03/09/17 0908 03/09/17 2219 03/11/17 0327 03/13/17 0600  WBC  --  11.0* 10.5 7.0  NEUTROABS  --  5.9  --   --   HGB 8.1* 8.4* 8.2* 7.8*  HCT  --  26.3* 25.9* 24.4*  MCV  --  90.1 89.3 88.4  PLT  --  249 228 215   Recent Labs  Lab 03/13/17 1145 03/13/17 1625 03/13/17 2035 03/14/17 0807 03/14/17 1157  GLUCAP 131* 100* 176* 133* 97    Recent Labs  Lab 03/09/17 0857  IRON 46  TIBC 200*  FERRITIN 292   Medications:  . amLODipine  10 mg Oral Daily  . aspirin EC  325 mg Oral Daily  . atenolol  25 mg Oral Daily  . cholecalciferol  5,000 Units Oral Daily  . cloNIDine  0.2 mg Oral BID  . clopidogrel  75 mg Oral Daily  . darbepoetin (ARANESP) injection - NON-DIALYSIS  100 mcg Subcutaneous Q Mon-1800  . ezetimibe  10 mg Oral Daily  . heparin  5,000 Units  Subcutaneous Q8H  . insulin aspart  0-9 Units Subcutaneous TID WC  . latanoprost  1 drop Both Eyes QHS  . pantoprazole  40 mg Oral Daily  . PARoxetine  40 mg Oral Daily  . polyethylene glycol  17 g Oral Daily  . pravastatin  40 mg Oral QHS  . sodium bicarbonate  650 mg Oral TID      Background: 71 y.o. year-old who presented to ED on 11/17 after falling at home (tripped over the dog) and hitting her L side. L sided CP. In the ED was hypoxemic and CXR large RLL infiltrates c/w PNA.  No fever, not on home O2.  She has known CKD f/b Dr Coladonato and just had LUA AV graft placed on 02/13/17 by Dr Fields. Asked to see for CKD V.   Impression: 1. CKD stage V - eGFR very low, not grossly uremic and still reluctant/declines to start HD. AVG placed 10/23 OK for use. Wants to "get things straightened out at home first" and unless "1 foot in the grave and the other one on a banana peel" won't start yet. Says after appt with Dr. Coladonato Dec 6 she will agree to start. 2. PNA - RML/RLL - levaquin through 11/23 3. HTN - on atenolol/ norvasc/ clonidine at home. 4. Recent CVA admit Aug 2018 - left frontal and parietal infarcts, MCA  territory. ECHO normal, some carotid plaquing. Neuro rec'd asa and plavix.   5. Anemia - started ESA 100 mcg Aranesp dosed 11/19. TSat 23% Fe 46 11/16. No IV Fe with active PNA 6. Metabolic acidosis - starting oral Na bicarb since pt not agreeing to start HD 7. Pt does have appt with Dr. Coladonato at [facility] Dec 6 at 7:45 AM and plans to keep that.   Renal will sign off - pt currently unwilling to start HD. No urgent indications. Anticipate will agree to start after her Dec 6 outpt appt. No other recommendations at this time.    , MD [facility] [phone] pager 03/14/2017, 12:25 PM 

## 2017-03-15 DIAGNOSIS — G4700 Insomnia, unspecified: Secondary | ICD-10-CM

## 2017-03-15 LAB — CBC
HCT: 23.4 % — ABNORMAL LOW (ref 36.0–46.0)
HEMOGLOBIN: 7.7 g/dL — AB (ref 12.0–15.0)
MCH: 28.8 pg (ref 26.0–34.0)
MCHC: 32.9 g/dL (ref 30.0–36.0)
MCV: 87.6 fL (ref 78.0–100.0)
PLATELETS: 240 10*3/uL (ref 150–400)
RBC: 2.67 MIL/uL — ABNORMAL LOW (ref 3.87–5.11)
RDW: 15.3 % (ref 11.5–15.5)
WBC: 8.1 10*3/uL (ref 4.0–10.5)

## 2017-03-15 LAB — GLUCOSE, CAPILLARY
GLUCOSE-CAPILLARY: 132 mg/dL — AB (ref 65–99)
GLUCOSE-CAPILLARY: 111 mg/dL — AB (ref 65–99)
Glucose-Capillary: 165 mg/dL — ABNORMAL HIGH (ref 65–99)
Glucose-Capillary: 120 mg/dL — ABNORMAL HIGH (ref 65–99)

## 2017-03-15 LAB — BASIC METABOLIC PANEL
Anion gap: 8 (ref 5–15)
BUN: 52 mg/dL — AB (ref 6–20)
CALCIUM: 7.9 mg/dL — AB (ref 8.9–10.3)
CHLORIDE: 114 mmol/L — AB (ref 101–111)
CO2: 19 mmol/L — ABNORMAL LOW (ref 22–32)
CREATININE: 6.82 mg/dL — AB (ref 0.44–1.00)
GFR, EST AFRICAN AMERICAN: 6 mL/min — AB (ref >60)
GFR, EST NON AFRICAN AMERICAN: 5 mL/min — AB (ref >60)
Glucose, Bld: 133 mg/dL — ABNORMAL HIGH (ref 65–99)
Potassium: 4.4 mmol/L (ref 3.5–5.1)
SODIUM: 141 mmol/L (ref 135–145)

## 2017-03-15 LAB — CULTURE, BLOOD (ROUTINE X 2)
Culture: NO GROWTH
Culture: NO GROWTH
SPECIAL REQUESTS: ADEQUATE

## 2017-03-15 MED ORDER — BISACODYL 5 MG PO TBEC
5.0000 mg | DELAYED_RELEASE_TABLET | Freq: Once | ORAL | Status: AC
  Administered 2017-03-15: 5 mg via ORAL
  Filled 2017-03-15: qty 1

## 2017-03-15 NOTE — Progress Notes (Signed)
PROGRESS NOTE    [name]  ZOX:[id number] DOB: 1945-10-24 DOA: 03/10/2017 PCP: Sharon Seller, NP   Chief Complaint  Patient presents with  . Shortness of Breath    Brief Narrative:  HPI On 03/10/2017 by Ms. Marlowe Kays, PA [name] is a 71 y.o. female with medical history significant for hypertension, hyperlipidemia, diabetes, CKD stage V, not yet on dialysis, presenting to the ED with increasing shortness of breath, and nonproductive cough.  This was preceded by a fall around November 7, landing on the left side, causing left-sided rib pain since that time, and hurting her when taking a deep breath.  At the time, did not hit her head or lose consciousness.  She denies any chest pain or palpitations.  She denies rhinorrhea or hemoptysis.  Denies subjective fever or chills.  She denies any sick contacts, or recent long distance travel.  Denies any abdominal pain. Has decreased appetite due to current symptoms.patient still making urine.  She denies any dysuria or gross hematuria.  Denies lower extremity swelling. No confusion was reported. Denies any vision changes, double vision or headaches.   Assessment & Plan   Acute respiratory failure with hypoxia secondary to community-acquired pneumonia -Chest x-ray on 03/14/2017 showed increased pulmonary vascularity with right pleural effusion -Patient was noted to be hypoxic in the emergency department was placed on 2 L of oxygen -Continue supplemental oxygen to maintain saturation above 92% -Continue nebulous treatments, Levaquin -Right thoracentesis was ordered however not enough fluid for collection  Chronic kidney disease, stage V -Nephrology consulted and appreciated -Patient refuses to start dialysis at this point, will continue to think about it and was to discuss with Dr. Arrie Aran- has an appointment on 03/29/2017 at 7:45 AM -AVG placed 02/13/2017 -Does not appear to be grossly uremic at this time  History of left  rib fracture and fall -PT consulted, recommended HH -Continue to treat symptomatically  Essential hypertension -Continue atenolol, amlodipine, clonidine  Recent CVA -In August 2018 -Continue aspirin, Plavix, statin  Hyperlipidemia -Continue statin, Zetia  Anemia of chronic disease -Hemoglobin remained stable, continue to monitor CBC  GERD -Continue PPI  Depression -Continue paroxetine  DVT Prophylaxis  Heparin  Code Status: Full  Family Communication: None at bedside  Disposition Plan: Admitted. Possible discharge to home in 24-48 hours pending improvement in breathing  Consultants Nephrology  Procedures  None  Antibiotics   Anti-infectives (From admission, onward)   Start     Dose/Rate Route Frequency Ordered Stop   03/14/17 0800  levofloxacin (LEVAQUIN) tablet 500 mg     500 mg Oral Every 48 hours 03/13/17 1137 03/14/17 0834   03/12/17 0600  levofloxacin (LEVAQUIN) IVPB 500 mg  Status:  Discontinued     500 mg 100 mL/hr over 60 Minutes Intravenous Every 48 hours 03/10/17 0752 03/13/17 1137   03/10/17 0545  levofloxacin (LEVAQUIN) IVPB 500 mg     500 mg 100 mL/hr over 60 Minutes Intravenous  Once 03/10/17 0532 03/10/17 0720      Subjective:   [employer] seen and examined today.  Feels her breathing has improved mildly, continues to have cough and shortness of breath with exertion. Denies chest pain, abdominal pain, nausea vomiting, diarrhea or constipation.   Objective:   Vitals:   03/14/17 1732 03/14/17 2027 03/15/17 0629 03/15/17 1016  BP: (!) 144/66 (!) 144/73 140/65 (!) 155/78  Pulse: (!) 59 (!) 59 64 (!) 58  Resp: 18 18 18 18   Temp: 98.1 F (36.7  C) 98.5 F (36.9 C) 98.1 F (36.7 C) 97.9 F (36.6 C)  TempSrc: Oral Oral Oral Oral  SpO2: 98% 98% 99% 100%  Weight:  73.3 kg (161 lb 9.6 oz)    Height:        Intake/Output Summary (Last 24 hours) at 03/15/2017 1300 Last data filed at 03/15/2017 0958 Gross per 24 hour  Intake 622 ml    Output 0 ml  Net 622 ml   Filed Weights   03/12/17 2127 03/13/17 2036 03/14/17 2027  Weight: 73 kg (161 lb) 73.1 kg (161 lb 3.2 oz) 73.3 kg (161 lb 9.6 oz)    Exam  General: Well developed, well nourished, NAD, appears stated age  HEENT: NCAT, mucous membranes moist.   Cardiovascular: S1 S2 auscultated, no rubs, murmurs or gallops. Regular rate and rhythm.  Respiratory: Clear to auscultation bilaterally with equal chest rise  Abdomen: Soft, obese, nontender, nondistended, + bowel sounds  Extremities: warm dry without cyanosis clubbing. Trace lower extremity edema  Neuro: AAOx3, nonfocal  Psych: Normal affect and demeanor   Data Reviewed: I have personally reviewed following labs and imaging studies  CBC: Recent Labs  Lab 03/09/17 0908 03/09/17 2219 03/11/17 0327 03/13/17 0600 03/15/17 0308  WBC  --  11.0* 10.5 7.0 8.1  NEUTROABS  --  5.9  --   --   --   HGB 8.1* 8.4* 8.2* 7.8* 7.7*  HCT  --  26.3* 25.9* 24.4* 23.4*  MCV  --  90.1 89.3 88.4 87.6  PLT  --  249 228 215 240   Basic Metabolic Panel: Recent Labs  Lab 03/09/17 0857  03/11/17 0327 03/12/17 0455 03/13/17 0600 03/14/17 0554 03/15/17 0308  NA 141   < > 143 143 142 140 141  K 5.2*   < > 5.5* 4.1 4.0 4.0 4.4  CL 117*   < > 120* 117* 115* 112* 114*  CO2 16*   < > 15* 16* 18* 19* 19*  GLUCOSE 120*   < > 129* 131* 118* 127* 133*  BUN 55*   < > 54* 50* 51* 51* 52*  CREATININE 6.50*   < > 6.42* 6.67* 6.93* 6.76* 6.82*  CALCIUM 8.0*  7.9*   < > 8.2* 8.2* 8.1* 8.0* 7.9*  PHOS 5.4*  --   --   --  5.5* 5.2*  --    < > = values in this interval not displayed.   GFR: Estimated Creatinine Clearance: 7.6 mL/min (A) (by C-G formula based on SCr of 6.82 mg/dL (H)). Liver Function Tests: Recent Labs  Lab 03/09/17 0857 03/09/17 2219 03/11/17 0327 03/13/17 0600 03/14/17 0554  AST  --  19 15  --   --   ALT  --  19 17  --   --   ALKPHOS  --  93 87  --   --   BILITOT  --  0.7 0.5  --   --   PROT  --   6.0* 5.8*  --   --   ALBUMIN 3.0* 3.1* 2.8* 2.6* 2.4*   No results for input(s): LIPASE, AMYLASE in the last 168 hours. No results for input(s): AMMONIA in the last 168 hours. Coagulation Profile: No results for input(s): INR, PROTIME in the last 168 hours. Cardiac Enzymes: No results for input(s): CKTOTAL, CKMB, CKMBINDEX, TROPONINI in the last 168 hours. BNP (last 3 results) No results for input(s): PROBNP in the last 8760 hours. HbA1C: No results for input(s): HGBA1C in the last  72 hours. CBG: Recent Labs  Lab 03/14/17 1157 03/14/17 1702 03/14/17 2025 03/15/17 0831 03/15/17 1208  GLUCAP 97 99 127* 132* 165*   Lipid Profile: No results for input(s): CHOL, HDL, LDLCALC, TRIG, CHOLHDL, LDLDIRECT in the last 72 hours. Thyroid Function Tests: No results for input(s): TSH, T4TOTAL, FREET4, T3FREE, THYROIDAB in the last 72 hours. Anemia Panel: No results for input(s): VITAMINB12, FOLATE, FERRITIN, TIBC, IRON, RETICCTPCT in the last 72 hours. Urine analysis:    Component Value Date/Time   COLORURINE YELLOW 11/21/2012 1438   APPEARANCEUR CLEAR 11/21/2012 1438   LABSPEC 1.027 11/21/2012 1438   PHURINE 5.0 11/21/2012 1438   GLUCOSEU NEG 11/21/2012 1438   HGBUR NEG 11/21/2012 1438   BILIRUBINUR SMALL (A) 11/21/2012 1438   KETONESUR TRACE (A) 11/21/2012 1438   PROTEINUR > 300 (A) 11/21/2012 1438   UROBILINOGEN 0.2 11/21/2012 1438   NITRITE NEG 11/21/2012 1438   LEUKOCYTESUR MOD (A) 11/21/2012 1438   Sepsis Labs: @LABRCNTIP (procalcitonin:4,lacticidven:4)  ) Recent Results (from the past 240 hour(s))  Blood culture (routine x 2)     Status: None (Preliminary result)   Collection Time: 03/10/17  5:46 AM  Result Value Ref Range Status   Specimen Description BLOOD RIGHT ARM  Final   Special Requests   Final    BOTTLES DRAWN AEROBIC AND ANAEROBIC Blood Culture results may not be optimal due to an excessive volume of blood received in culture bottles   Culture NO GROWTH 4 DAYS   Final   Report Status PENDING  Incomplete  Blood culture (routine x 2)     Status: None (Preliminary result)   Collection Time: 03/10/17  6:05 AM  Result Value Ref Range Status   Specimen Description BLOOD RIGHT ARM  Final   Special Requests IN PEDIATRIC BOTTLE Blood Culture adequate volume  Final   Culture NO GROWTH 4 DAYS  Final   Report Status PENDING  Incomplete      Radiology Studies: Dg Chest Port 1 View  Result Date: 03/14/2017 CLINICAL DATA:  Hypoxia EXAM: PORTABLE CHEST 1 VIEW COMPARISON:  03/11/2017 FINDINGS: Right basilar and right middle lobe airspace disease with small right effusion unchanged. Left lung remains clear. Negative for heart failure. IMPRESSION: Right basilar airspace disease and right effusion unchanged. No new findings. Electronically Signed   By: Marlan Palau M.D.   On: 03/14/2017 09:33   Ir US Chest  Result Date: 03/14/2017 CLINICAL DATA:  71 year old female with a possible right-sided pleural effusion. EXAM: CHEST ULTRASOUND COMPARISON:  None. FINDINGS: The right chest was interrogated with ultrasound. There is a very small right-sided pleural effusion. The volume of fluid is insufficient to allow for thoracentesis. IMPRESSION: Small right-sided pleural effusion. Insufficient fluid volume for thoracentesis. Electronically Signed   By: Malachy Moan M.D.   On: 03/14/2017 16:44     Scheduled Meds: . amLODipine  10 mg Oral Daily  . aspirin EC  325 mg Oral Daily  . atenolol  25 mg Oral Daily  . cholecalciferol  5,000 Units Oral Daily  . cloNIDine  0.2 mg Oral BID  . clopidogrel  75 mg Oral Daily  . darbepoetin (ARANESP) injection - NON-DIALYSIS  100 mcg Subcutaneous Q Mon-1800  . ezetimibe  10 mg Oral Daily  . heparin  5,000 Units Subcutaneous Q8H  . insulin aspart  0-9 Units Subcutaneous TID WC  . latanoprost  1 drop Both Eyes QHS  . pantoprazole  40 mg Oral Daily  . PARoxetine  40 mg Oral Daily  .  polyethylene glycol  17 g Oral Daily  .  pravastatin  40 mg Oral QHS  . sodium bicarbonate  650 mg Oral TID   Continuous Infusions:   LOS: 4 days   Time Spent in minutes   30 minutes  Anadelia Kintz D.O. on 03/15/2017 at 1:00 PM  Between 7am to 7pm - Pager - [phone]  After 7pm go to [email or web address] - password [email or web address]  And look for the night coverage person covering for me after hours  [facility] Office  [phone]

## 2017-03-16 DIAGNOSIS — I639 Cerebral infarction, unspecified: Secondary | ICD-10-CM

## 2017-03-16 DIAGNOSIS — E782 Mixed hyperlipidemia: Secondary | ICD-10-CM

## 2017-03-16 DIAGNOSIS — Z794 Long term (current) use of insulin: Secondary | ICD-10-CM

## 2017-03-16 DIAGNOSIS — E1149 Type 2 diabetes mellitus with other diabetic neurological complication: Secondary | ICD-10-CM

## 2017-03-16 DIAGNOSIS — J181 Lobar pneumonia, unspecified organism: Principal | ICD-10-CM

## 2017-03-16 DIAGNOSIS — K219 Gastro-esophageal reflux disease without esophagitis: Secondary | ICD-10-CM

## 2017-03-16 DIAGNOSIS — Z720 Tobacco use: Secondary | ICD-10-CM

## 2017-03-16 DIAGNOSIS — F329 Major depressive disorder, single episode, unspecified: Secondary | ICD-10-CM

## 2017-03-16 DIAGNOSIS — N185 Chronic kidney disease, stage 5: Secondary | ICD-10-CM

## 2017-03-16 DIAGNOSIS — I10 Essential (primary) hypertension: Secondary | ICD-10-CM

## 2017-03-16 LAB — GLUCOSE, CAPILLARY
GLUCOSE-CAPILLARY: 105 mg/dL — AB (ref 65–99)
Glucose-Capillary: 125 mg/dL — ABNORMAL HIGH (ref 65–99)

## 2017-03-16 MED ORDER — SODIUM BICARBONATE 650 MG PO TABS: 650.0000 mg | ORAL_TABLET | Freq: Three times a day (TID) | ORAL | 0 refills | Status: DC

## 2017-03-16 MED ORDER — LEVOFLOXACIN 500 MG PO TABS
500.0000 mg | ORAL_TABLET | ORAL | Status: DC
  Administered 2017-03-16: 500 mg via ORAL
  Filled 2017-03-16: qty 1

## 2017-03-16 NOTE — Care Management Note (Signed)
Case Management Note  Patient Details  Name: [name] MRN: [id number] Date of Birth: 16-Jul-1945  Subjective/Objective:    CM following for progression and d/c planning.                 Action/Plan: 03/16/2017 Pt for d/c to home , pt offered list of [facility] agencies, declined to select an agency, this CM notified [facility] of pt needs. Pt requesting a walker, order entered and to be delivered to room by [facility].   Expected Discharge Date:  03/16/17               Expected Discharge Plan:  [place]  In-House Referral:  NA  Discharge planning Services  CM Consult  Post Acute Care Choice:  NA, Durable Medical Equipment, Home Health Choice offered to:  NA, Patient  DME Arranged:  Walker rolling DME Agency:  [place]:  PT [facility] Agency:  [place]  Status of Service:  Completed, signed off  If discussed at [place] of Stay Meetings, dates discussed:    Additional Comments:  Adron Bene, RN 03/16/2017, 2:04 PM

## 2017-03-16 NOTE — Progress Notes (Signed)
Physical Therapy Treatment Patient Details Name: [name] MRN: [id number] DOB: 03/05/1946 Today's Date: 03/16/2017    History of Present Illness [name] is a 71 y.o. female with medical history significant for hypertension, hyperlipidemia, diabetes, CKD stage V, not yet on dialysis, presented to the ED with increasing shortness of breath, and nonproductive cough. This was preceded by a fall around November 7 with rib pain.     PT Comments    Patient continued to progress with mobility.  Feel patient safe for d/c home from PT standpoint with cane when indoors and RW when outdoors/unfamiliar settings.  Patient will continue to benefit from PT in the HH/OP setting to progress balance and gait to progress to cane only.     Follow Up Recommendations  Home health PT;Supervision - Intermittent     Equipment Recommendations  Rolling walker with 5" wheels;Cane    Recommendations for Other Services       Precautions / Restrictions Precautions Precautions: Fall;Other (comment)    Mobility  Bed Mobility Overal bed mobility: Independent                Transfers Overall transfer level: Modified independent Equipment used: Rolling walker (2 wheeled) Transfers: Sit to/from Stand Sit to Stand: Modified independent (Device/Increase time)         General transfer comment: tends to move quickly which puts her at risk for falls  Ambulation/Gait Ambulation/Gait assistance: Supervision Ambulation Distance (Feet): 125 Feet Assistive device: None(125' with RW with modified indep) Gait Pattern/deviations: Step-through pattern     General Gait Details: patient ambulated with RW with modified independence.  With no assistive device, patient more supervision.  Discussed with patient to use RW when leaving home or when not feeling well, but that she could ambulate with cane in home.   Stairs            Wheelchair Mobility    Modified Rankin (Stroke Patients  Only)       Balance Overall balance assessment: Needs assistance Sitting-balance support: No upper extremity supported;Feet supported Sitting balance-Leahy Scale: Good     Standing balance support: No upper extremity supported;During functional activity Standing balance-Leahy Scale: Fair Standing balance comment: able to withstand mild perturbation in standing, but not moderate challenges.                            Cognition Arousal/Alertness: Awake/alert Behavior During Therapy: WFL for tasks assessed/performed Overall Cognitive Status: Within Functional Limits for tasks assessed                                        Exercises      General Comments        Pertinent Vitals/Pain Pain Assessment: No/denies pain    Home Living                      Prior Function            PT Goals (current goals can now be found in the care plan section) Progress towards PT goals: Progressing toward goals    Frequency    Min 3X/week      PT Plan Current plan remains appropriate    Co-evaluation              AM-PAC PT "6 Clicks" Daily Activity  Outcome Measure  Difficulty turning over in bed (including adjusting bedclothes, sheets and blankets)?: None Difficulty moving from lying on back to sitting on the side of the bed? : None Difficulty sitting down on and standing up from a chair with arms (e.g., wheelchair, bedside commode, etc,.)?: None Help needed moving to and from a bed to chair (including a wheelchair)?: None Help needed walking in hospital room?: A Little Help needed climbing 3-5 steps with a railing? : A Little 6 Click Score: 22    End of Session   Activity Tolerance: Patient tolerated treatment well Patient left: in bed;with call bell/phone within reach   PT Visit Diagnosis: Difficulty in walking, not elsewhere classified (R26.2);Muscle weakness (generalized) (M62.81)     Time: 1959-7471 PT Time Calculation  (min) (ACUTE ONLY): 12 min  Charges:  $Gait Training: 8-22 mins                    G Codes:       2017/03/26 [place], PT [phone]     Shanna Cisco March 26, 2017, 12:16 PM

## 2017-03-16 NOTE — Progress Notes (Signed)
Occupational Therapy Treatment Patient Details Name: [name] MRN: [id number] DOB: 1945/11/24 Today's Date: 03/16/2017    History of present illness [name] is a 71 y.o. female with medical history significant for hypertension, hyperlipidemia, diabetes, CKD stage V, not yet on dialysis, presented to the ED with increasing shortness of breath, and nonproductive cough. This was preceded by a fall around November 7 with rib pain.    OT comments  Pt progressing towards acute OT goals. Focus of session was education on energy conservation strategies for managing ADLs. Pt received sidelying position in bed having just received an enema. Did not mobilize this session. Pt reports she feels she tolerated walking in the halls early well. D/c plan remains appropriate.    Follow Up Recommendations  No OT follow up;Supervision - Intermittent    Equipment Recommendations  None recommended by OT    Recommendations for Other Services      Precautions / Restrictions Precautions Precautions: Fall;Other (comment) Precaution Comments: watch sats       Mobility Bed Mobility Overal bed mobility: Independent                Transfers Overall transfer level: Modified independent Equipment used: Rolling walker (2 wheeled) Transfers: Sit to/from Stand Sit to Stand: Modified independent (Device/Increase time)         General transfer comment: tends to move quickly which puts her at risk for falls    Balance Overall balance assessment: Needs assistance Sitting-balance support: No upper extremity supported;Feet supported Sitting balance-Leahy Scale: Good     Standing balance support: No upper extremity supported;During functional activity Standing balance-Leahy Scale: Fair Standing balance comment: able to withstand mild perturbation in standing, but not moderate challenges.                           ADL either performed or assessed with clinical judgement    ADL Overall ADL's : Needs assistance/impaired                                       General ADL Comments: Educated on energy conservation strategies for managing ADL tasks at home. Provided handout.      Vision       Perception     Praxis      Cognition Arousal/Alertness: Awake/alert Behavior During Therapy: WFL for tasks assessed/performed Overall Cognitive Status: Within Functional Limits for tasks assessed                                          Exercises     Shoulder Instructions       General Comments Pt in sidelying position having just received enema.     Pertinent Vitals/ Pain       Pain Assessment: No/denies pain  Home Living                                          Prior Functioning/Environment              Frequency  Min 2X/week        Progress Toward Goals  OT Goals(current goals can now be found in the care  plan section)  Progress towards OT goals: Progressing toward goals  Acute Rehab OT Goals Patient Stated Goal: to get stronger  OT Goal Formulation: With patient Time For Goal Achievement: 03/26/17 Potential to Achieve Goals: Good ADL Goals Pt Will Perform Grooming: with modified independence;standing Pt Will Perform Upper Body Bathing: with modified independence;standing;sitting Pt Will Perform Lower Body Bathing: with modified independence;sit to/from stand Pt Will Perform Upper Body Dressing: with modified independence;sitting Pt Will Perform Lower Body Dressing: with modified independence;sit to/from stand Pt Will Transfer to Toilet: with modified independence;ambulating;regular height toilet;grab bars Pt Will Perform Toileting - Clothing Manipulation and hygiene: with modified independence;sit to/from stand Pt Will Perform Tub/Shower Transfer: Tub transfer;with supervision;ambulating;grab bars Additional ADL Goal #1: Pt will be independent with energy conservation techniques  during ADLs  Plan Discharge plan remains appropriate    Co-evaluation                 AM-PAC PT "6 Clicks" Daily Activity     Outcome Measure   Help from another person eating meals?: None Help from another person taking care of personal grooming?: A Little Help from another person toileting, which includes using toliet, bedpan, or urinal?: A Little Help from another person bathing (including washing, rinsing, drying)?: A Little Help from another person to put on and taking off regular upper body clothing?: A Little Help from another person to put on and taking off regular lower body clothing?: A Little 6 Click Score: 19    End of Session    OT Visit Diagnosis: Unsteadiness on feet (R26.81)   Activity Tolerance     Patient Left in bed;with call bell/phone within reach   Nurse Communication          Time: 0092-3300 OT Time Calculation (min): 9 min  Charges: OT General Charges $OT Visit: 1 Visit OT Treatments $Self Care/Home Management : 8-22 mins    Hortencia Pilar 03/16/2017, 1:26 PM

## 2017-03-16 NOTE — Discharge Summary (Signed)
Discharge Summary  [name] STM:[id number] DOB: April 05, 1946  PCP: Deborah Chandler, NP  Admit date: 03/10/2017 Discharge date: 03/16/2017  Time spent: <30 minutes  Recommendations for Outpatient Follow-up:  1. PCP 2. Nephrology, already has appointment scheduled  Discharge Diagnoses:  Active Hospital Problems   Diagnosis Date Noted  . Hypoxia   . Pneumonia 03/10/2017  . CKD (chronic kidney disease) stage 5, GFR less than 15 ml/min (HCC) 12/03/2016  . CVA (cerebral vascular accident) ([place]) 12/03/2016  . Chronic knee pain 10/13/2014  . Anxiety   . Iron deficiency anemia due to chronic blood loss 09/05/2013  . Tobacco abuse 10/18/2012  . GERD (gastroesophageal reflux disease) with acute nausea  09/29/2010  . DM (diabetes mellitus) type II controlled, neurological manifestation ([place]) 09/29/2010  . Hyperlipidemia 10/30/2008  . Depression 07/16/2008  . INSOMNIA UNSPECIFIED 12/19/2007  . Essential hypertension 11/04/2007    Resolved Hospital Problems  No resolved problems to display.    Discharge Condition: Stable  Diet recommendation: Heart healthy, renal diet  Vitals:   03/16/17 0439 03/16/17 0847  BP: (!) 151/67 (!) 142/73  Pulse: 60 (!) 57  Resp: 18 18  Temp: 98.4 F (36.9 C) 98.2 F (36.8 C)  SpO2: 99% 100%    History of present illness:  Deborah L Battenis a 71 y.o.femalewith medical history significant forhypertension, hyperlipidemia, diabetes, CKD stage V, not yet on dialysis, presenting to the ED with increasing shortness of breath, and nonproductive cough. This was preceded by a fall around November 7, landing on the left side, causing left-sided rib pain since that time, and hurting her when taking a deep breath. At the time, did not hit her head or lose consciousness. She denies any chest pain or palpitations. She denies rhinorrhea or hemoptysis. Denies subjective fever or chills. She denies any sick contacts, or recent long distance travel.  Denies any abdominal pain. Has decreased appetite due to current symptoms. Patient still making urine. She denies any dysuria or gross hematuria. Denies lower extremity swelling. No confusion was reported. Denies any vision changes, double vision or headaches.   Today, patient reported feeling better overall.  Denied any chest pain, worsening shortness of breath, cough, nausea/vomiting, abdominal pain, fever/chills, dizziness.  Patient stable to be discharged home  Hospital Course:  Active Problems:   Hyperlipidemia   Depression   Essential hypertension   INSOMNIA UNSPECIFIED   GERD (gastroesophageal reflux disease) with acute nausea    DM (diabetes mellitus) type II controlled, neurological manifestation (HCC)   Tobacco abuse   Iron deficiency anemia due to chronic blood loss   Anxiety   Chronic knee pain   CKD (chronic kidney disease) stage 5, GFR less than 15 ml/min (HCC)   CVA (cerebral vascular accident) ([place])   Pneumonia   Hypoxia  Acute respiratory failure with hypoxia secondary to community-acquired pneumonia Resolved -Chest x-ray on 03/14/2017 showed increased pulmonary vascularity with normal right pleural effusion -Patient was noted to be hypoxic in the emergency department was placed on 2 L of oxygen -No longer requiring oxygen -Status post 7 days of Levaquin -Right thoracentesis was ordered however not enough fluid for collection -Patient stable for discharge  Chronic kidney disease, stage V -Nephrology consulted and appreciated -Patient refuses to start dialysis at this point, will continue to think about it and was to discuss with [name]- has an appointment on 03/29/2017 at 7:45 AM -AVG placed 02/13/2017 -Does not appear to be grossly uremic at this time Follow-up with nephrology as outpatient  History of left rib fracture and fall -PT consulted, recommended HH  Essential hypertension -Continue atenolol, amlodipine, clonidine PCP to continue to  monitor  Recent CVA -In August 2018 -Continue aspirin, Plavix, statin  Hyperlipidemia -Continue statin, Zetia  Anemia of chronic disease -Hemoglobin remained stable PCP to continue to monitor  GERD -Continue PPI  Depression -Continue paroxetine    Procedures:  None  Consultations:  Nephrology  Discharge Exam: BP (!) 142/73 (BP Location: Right Arm)   Pulse (!) 57   Temp 98.2 F (36.8 C) (Oral)   Resp 18   Ht 5\' 5"  (1.651 m)   Wt 73.3 kg (161 lb 9.6 oz)   SpO2 100%   BMI 26.89 kg/m   General: Alert, awake, oriented x3, not in any distress Cardiovascular: S1-S2 present, no added heart sounds Respiratory: Chest clear bilaterally  Discharge Instructions You were cared for by a hospitalist during your hospital stay. If you have any questions about your discharge medications or the care you received while you were in the hospital after you are discharged, you can call the unit and asked to speak with the hospitalist on call if the hospitalist that took care of you is not available. Once you are discharged, your primary care physician will handle any further medical issues. Please note that NO REFILLS for any discharge medications will be authorized once you are discharged, as it is imperative that you return to your primary care physician (or establish a relationship with a primary care physician if you do not have one) for your aftercare needs so that they can reassess your need for medications and monitor your lab values.  Discharge Instructions    AMB Referral to [facility] Management   Complete by:  As directed    Please assign [employer] member to [place] LCSW due to high risk for readmit. Written consent obtained. Will be new to HD. Multiple hospitalizations. [facility] LCSW for follow up for depression. Contact brother if unable to contact patient post discharge [name] [phone]. Please call with questions. Deborah Rolling, MSN-Ed, RN,BSN [facility] [phone]   Reason for consult:  Please assign to [facility] LCSW and [facility] RNCM   Diagnoses of:   Diabetes Kidney Failure     Expected date of contact:  1-3 days (reserved for hospital discharges)   Diet - low sodium heart healthy   Complete by:  As directed    Increase activity slowly   Complete by:  As directed      Allergies as of 03/16/2017      Reactions   Lisinopril Other (See Comments)   Abnormal Kidney Function    Pioglitazone Other (See Comments)   REACTION: Desquamation of skin of the palm   Morphine And Related Nausea And Vomiting   Sulfonamide Derivatives Rash      Medication List    TAKE these medications   ALPRAZolam 0.5 MG tablet Commonly known as:  XANAX Take one tablet by mouth twice daily as needed for anxiety   amLODipine 10 MG tablet Commonly known as:  NORVASC Take 1 tablet (10 mg total) by mouth daily.   aspirin EC 325 MG tablet Take 1 tablet (325 mg total) by mouth daily.   atenolol 25 MG tablet Commonly known as:  TENORMIN Take 1 tablet (25 mg total) by mouth daily.   cloNIDine 0.2 MG tablet Commonly known as:  CATAPRES Take 1 tablet (0.2 mg total) by mouth 2 (two) times daily.  clopidogrel 75 MG tablet Commonly known as:  PLAVIX Take 1 tablet (75 mg total) by mouth daily.   ezetimibe 10 MG tablet Commonly known as:  ZETIA Take 1 tablet (10 mg total) by mouth daily.   latanoprost 0.005 % ophthalmic solution Commonly known as:  XALATAN Place 1 drop into both eyes at bedtime. Reported on 08/27/2015   Melatonin 3 MG Tabs 6 mg by mouth as needed for sleep. What changed:    how much to take  how to take this  when to take this  reasons to take this  additional instructions   omeprazole 40 MG capsule Commonly known as:  PRILOSEC Take 40 mg by mouth daily.   oxyCODONE-acetaminophen 5-325 MG tablet Commonly known as:  ROXICET Take 1 tablet by mouth every 6 (six) hours as needed for severe pain.     PARoxetine 40 MG tablet Commonly known as:  PAXIL Take 1 tablet (40 mg total) by mouth daily.   pravastatin 40 MG tablet Commonly known as:  PRAVACHOL Take 1 tablet (40 mg total) by mouth at bedtime.   sodium bicarbonate 650 MG tablet Take 1 tablet (650 mg total) by mouth 3 (three) times daily.   SUMAtriptan 25 MG tablet Commonly known as:  IMITREX 1 tablet daily as needed for headache, May repeat in 2 hours if headache persists or recurs. Max of 2 tablets in 24 hours   triamcinolone cream 0.1 % Commonly known as:  KENALOG Apply 1 application topically 3 (three) times daily as needed (rash).   Vitamin D3 5000 units Tabs Take 1 tablet (5,000 Units total) by mouth daily.      Allergies  Allergen Reactions  . Lisinopril Other (See Comments)    Abnormal Kidney Function   . Pioglitazone Other (See Comments)    REACTION: Desquamation of skin of the palm  . Morphine And Related Nausea And Vomiting  . Sulfonamide Derivatives Rash   Follow-up Information    Deborah Chandler, NP. Schedule an appointment as soon as possible for a visit in 1 week(s).   Specialty:  Geriatric Medicine Contact information: [address] [place] [address] [phone]            The results of significant diagnostics from this hospitalization (including imaging, microbiology, ancillary and laboratory) are listed below for reference.    Significant Diagnostic Studies: Dg Chest 2 View  Result Date: 03/11/2017 CLINICAL DATA:  Pneumonia. EXAM: CHEST  2 VIEW COMPARISON:  Chest x-ray dated March 09, 2017. FINDINGS: The patient is rotated to the right. The cardiomediastinal silhouette is unchanged. Patchy consolidation in the right middle and lower lobes with adjacent small pleural effusion, similar to prior study. Trace left pleural effusion is unchanged. No pneumothorax. No acute osseous abnormality. IMPRESSION: Right middle and lower lobe pneumonia with adjacent small pleural effusion,  similar to prior study. Electronically Signed   By: Titus Dubin M.D.   On: 03/11/2017 09:06   Dg Chest 2 View  Result Date: 03/09/2017 CLINICAL DATA:  71 year old female with shortness of breath. EXAM: CHEST  2 VIEW COMPARISON:  Chest radiograph dated 12/02/2016 FINDINGS: There is a small right and probable trace left pleural effusions. Patchy area of density in the right lower and middle lobes is concerning for pneumonia. Clinical correlation is recommended. There is no pneumothorax. Stable top-normal cardiac silhouette. Osteopenia with degenerative changes of the spine. Mild old lower thoracic compression deformities. No acute osseous pathology. Atherosclerotic calcification of the aorta noted. IMPRESSION: Consolidative changes  of the right lung most consistent with pneumonia. Clinical correlation and follow-up recommended. Small bilateral pleural effusions. Electronically Signed   By: Anner Crete M.D.   On: 03/09/2017 23:03   Dg Chest Port 1 View  Result Date: 03/14/2017 CLINICAL DATA:  Hypoxia EXAM: PORTABLE CHEST 1 VIEW COMPARISON:  03/11/2017 FINDINGS: Right basilar and right middle lobe airspace disease with small right effusion unchanged. Left lung remains clear. Negative for heart failure. IMPRESSION: Right basilar airspace disease and right effusion unchanged. No new findings. Electronically Signed   By: Franchot Gallo M.D.   On: 03/14/2017 09:33   Ir US Chest  Result Date: 03/14/2017 CLINICAL DATA:  71 year old female with a possible right-sided pleural effusion. EXAM: CHEST ULTRASOUND COMPARISON:  None. FINDINGS: The right chest was interrogated with ultrasound. There is a very small right-sided pleural effusion. The volume of fluid is insufficient to allow for thoracentesis. IMPRESSION: Small right-sided pleural effusion. Insufficient fluid volume for thoracentesis. Electronically Signed   By: Jacqulynn Cadet M.D.   On: 03/14/2017 16:44    Microbiology: Recent Results (from  the past 240 hour(s))  Blood culture (routine x 2)     Status: None   Collection Time: 03/10/17  5:46 AM  Result Value Ref Range Status   Specimen Description BLOOD RIGHT ARM  Final   Special Requests   Final    BOTTLES DRAWN AEROBIC AND ANAEROBIC Blood Culture results may not be optimal due to an excessive volume of blood received in culture bottles   Culture NO GROWTH 5 DAYS  Final   Report Status 03/15/2017 FINAL  Final  Blood culture (routine x 2)     Status: None   Collection Time: 03/10/17  6:05 AM  Result Value Ref Range Status   Specimen Description BLOOD RIGHT ARM  Final   Special Requests IN PEDIATRIC BOTTLE Blood Culture adequate volume  Final   Culture NO GROWTH 5 DAYS  Final   Report Status 03/15/2017 FINAL  Final     Labs: Basic Metabolic Panel: Recent Labs  Lab 03/11/17 0327 03/12/17 0455 03/13/17 0600 03/14/17 0554 03/15/17 0308  NA 143 143 142 140 141  K 5.5* 4.1 4.0 4.0 4.4  CL 120* 117* 115* 112* 114*  CO2 15* 16* 18* 19* 19*  GLUCOSE 129* 131* 118* 127* 133*  BUN 54* 50* 51* 51* 52*  CREATININE 6.42* 6.67* 6.93* 6.76* 6.82*  CALCIUM 8.2* 8.2* 8.1* 8.0* 7.9*  PHOS  --   --  5.5* 5.2*  --    Liver Function Tests: Recent Labs  Lab 03/09/17 2219 03/11/17 0327 03/13/17 0600 03/14/17 0554  AST 19 15  --   --   ALT 19 17  --   --   ALKPHOS 93 87  --   --   BILITOT 0.7 0.5  --   --   PROT 6.0* 5.8*  --   --   ALBUMIN 3.1* 2.8* 2.6* 2.4*   No results for input(s): LIPASE, AMYLASE in the last 168 hours. No results for input(s): AMMONIA in the last 168 hours. CBC: Recent Labs  Lab 03/09/17 2219 03/11/17 0327 03/13/17 0600 03/15/17 0308  WBC 11.0* 10.5 7.0 8.1  NEUTROABS 5.9  --   --   --   HGB 8.4* 8.2* 7.8* 7.7*  HCT 26.3* 25.9* 24.4* 23.4*  MCV 90.1 89.3 88.4 87.6  PLT 249 228 215 240   Cardiac Enzymes: No results for input(s): CKTOTAL, CKMB, CKMBINDEX, TROPONINI in the last 168 hours. BNP:  BNP (last 3 results) No results for input(s):  BNP in the last 8760 hours.  ProBNP (last 3 results) No results for input(s): PROBNP in the last 8760 hours.  CBG: Recent Labs  Lab 03/15/17 1208 03/15/17 1655 03/15/17 2219 03/16/17 0759 03/16/17 1146  GLUCAP 165* 120* 111* 125* 105*       Signed:  Alma Friendly, MD [facility] 03/16/2017, 8:38 PM

## 2017-03-19 ENCOUNTER — Other Ambulatory Visit: Payer: Self-pay | Admitting: *Deleted

## 2017-03-19 ENCOUNTER — Other Ambulatory Visit: Payer: Self-pay | Admitting: Licensed Clinical Social Worker

## 2017-03-19 ENCOUNTER — Telehealth: Payer: Self-pay

## 2017-03-19 DIAGNOSIS — N183 Chronic kidney disease, stage 3 (moderate): Secondary | ICD-10-CM | POA: Diagnosis not present

## 2017-03-19 DIAGNOSIS — R1310 Dysphagia, unspecified: Secondary | ICD-10-CM | POA: Diagnosis not present

## 2017-03-19 DIAGNOSIS — N185 Chronic kidney disease, stage 5: Secondary | ICD-10-CM | POA: Diagnosis not present

## 2017-03-19 DIAGNOSIS — I120 Hypertensive chronic kidney disease with stage 5 chronic kidney disease or end stage renal disease: Secondary | ICD-10-CM | POA: Diagnosis not present

## 2017-03-19 DIAGNOSIS — K2270 Barrett's esophagus without dysplasia: Secondary | ICD-10-CM | POA: Diagnosis not present

## 2017-03-19 DIAGNOSIS — F329 Major depressive disorder, single episode, unspecified: Secondary | ICD-10-CM | POA: Diagnosis not present

## 2017-03-19 DIAGNOSIS — R0781 Pleurodynia: Secondary | ICD-10-CM | POA: Diagnosis not present

## 2017-03-19 DIAGNOSIS — J189 Pneumonia, unspecified organism: Secondary | ICD-10-CM | POA: Diagnosis not present

## 2017-03-19 DIAGNOSIS — E1122 Type 2 diabetes mellitus with diabetic chronic kidney disease: Secondary | ICD-10-CM | POA: Diagnosis not present

## 2017-03-19 DIAGNOSIS — D631 Anemia in chronic kidney disease: Secondary | ICD-10-CM | POA: Diagnosis not present

## 2017-03-19 NOTE — Patient Outreach (Signed)
[facility]) Care Management  03/19/2017  [name] [birth year] [id number]   Referral received from hospital liaison requesting contact with member for transition of care.  Member admitted on 11/17 for pneumonia, discharged 11/23.  Per chart, she also has history of hypertension, stroke, GERD, diabetes, hyperlipidemia, and depression.  Call placed to member to initiate transition of care, no answer.  Unable to leave a message as no voicemail noted.  Will make another attempt to contact tomorrow.  [name], [place], MSN [place] [phone]

## 2017-03-19 NOTE — Patient Outreach (Signed)
[facility]) Care Management  03/19/2017  [name] [birth year] [id number]  Assessment-CSW completed initial outreach attempt today after receiving referral for mental health resource assistance. CSW unable to reach patient successfully. CSW was unable to leave a voice message as well as phone continuously rang.  Plan-CSW will await return call or complete an additional outreach if needed within one week.  Eula Fried, BSW, MSW, [email or web address] Phone: [phone] Fax: [phone]

## 2017-03-19 NOTE — Telephone Encounter (Signed)
I have made the 1st attempt to contact the patient or family member in charge, in order to follow up from recently being discharged from the hospital. I left a message on voicemail but I will make another attempt at a different time.  

## 2017-03-20 ENCOUNTER — Other Ambulatory Visit: Payer: Self-pay | Admitting: *Deleted

## 2017-03-20 ENCOUNTER — Other Ambulatory Visit: Payer: Self-pay | Admitting: Licensed Clinical Social Worker

## 2017-03-20 NOTE — Patient Outreach (Signed)
[facility]) Care Management  03/20/2017  [name] 12-10-1945 [id number]   2nd attempt to initiate transition of care unsuccessful, unable to leave voice message.  Will make 3rd attempt tomorrow.  [name], [place], MSN [place] [phone]

## 2017-03-20 NOTE — Patient Outreach (Signed)
[facility]) Care Management  03/20/2017  CRISTELA STALDER 1946-02-02 [id number]  Assessment-CSW completed second outreach attempt today. CSW unable to reach patient successfully or leave a voice message. [facility] RNCM and CSW will continue to make outreach attempts in order to establish contact.   Plan-CSW will await return call or complete third and final outreach if needed.  Eula Fried, BSW, MSW, [email or web address] Phone: [phone] Fax: [phone]

## 2017-03-20 NOTE — Telephone Encounter (Signed)
Transition Care Management Follow-Up Telephone Call   Date discharged and where: [facility] on 03/16/17  How have you been since you were released from the hospital? Doing better, has already seen therapist once and plans to see them again on Thursday. Still has some soreness on her left side due to the fall.   Any patient concerns? None  Items Reviewed:   Meds: verified  Allergies: verified  Dietary Changes Reviewed: verified  Functional Questionnaire:  Independent-I Dependent-D  ADLs:   Dressing- I    Eating- I   Maintaining continence- I   Transferring- I   Transportation- D   Meal Prep- I   Managing Meds- I  Confirmed importance and Date/Time of follow-up visits scheduled: NONE- pt advised to call and schedule ASAP. Pt states she will call this afternoon to set up an apt.   Confirmed with patient if condition worsens to call PCP or go to the Emergency Dept. Patient was given office number and encouraged to call back with questions or concerns: YES

## 2017-03-21 ENCOUNTER — Other Ambulatory Visit: Payer: Self-pay | Admitting: *Deleted

## 2017-03-21 ENCOUNTER — Encounter: Payer: Self-pay | Admitting: Nurse Practitioner

## 2017-03-21 ENCOUNTER — Ambulatory Visit: Payer: Medicare HMO | Admitting: Nurse Practitioner

## 2017-03-21 ENCOUNTER — Other Ambulatory Visit: Payer: Self-pay | Admitting: Licensed Clinical Social Worker

## 2017-03-21 VITALS — BP 124/62 | HR 62 | Temp 98.1°F | Resp 17 | Ht 65.0 in | Wt 164.0 lb

## 2017-03-21 DIAGNOSIS — J181 Lobar pneumonia, unspecified organism: Secondary | ICD-10-CM

## 2017-03-21 DIAGNOSIS — L309 Dermatitis, unspecified: Secondary | ICD-10-CM | POA: Diagnosis not present

## 2017-03-21 DIAGNOSIS — Z794 Long term (current) use of insulin: Secondary | ICD-10-CM

## 2017-03-21 DIAGNOSIS — K5901 Slow transit constipation: Secondary | ICD-10-CM | POA: Diagnosis not present

## 2017-03-21 DIAGNOSIS — R0781 Pleurodynia: Secondary | ICD-10-CM

## 2017-03-21 DIAGNOSIS — J189 Pneumonia, unspecified organism: Secondary | ICD-10-CM

## 2017-03-21 DIAGNOSIS — E1149 Type 2 diabetes mellitus with other diabetic neurological complication: Secondary | ICD-10-CM | POA: Diagnosis not present

## 2017-03-21 DIAGNOSIS — N185 Chronic kidney disease, stage 5: Secondary | ICD-10-CM

## 2017-03-21 MED ORDER — CRISABOROLE 2 % EX OINT: 1.0000 "application " | TOPICAL_OINTMENT | Freq: Two times a day (BID) | CUTANEOUS | Status: DC

## 2017-03-21 NOTE — Patient Instructions (Addendum)
  To use Colace 100 mg by mouth daily with miralax 17 gm daily for constipation.   To use heat 2-3 times daily for right side pain.  To use asper cream with lidocaine after you apply heat  To use Eucrisa twice daily to affected area on leg to see if this helps symptoms.

## 2017-03-21 NOTE — Progress Notes (Signed)
Careteam: Patient Care Team: [name], NP as PCP - General (Nurse Practitioner) Wood[place] Mode (Inactive) as Diabetes Educator [name], MD as Consulting Physician (Ophthalmology) [name], MD as Consulting Physician (Nephrology) [name], RN as [place], [place], LCSW as [place] Management (Licensed Clinical Social Worker)   Allergies  Allergen Reactions  . Lisinopril Other (See Comments)    Abnormal Kidney Function   . Pioglitazone Other (See Comments)    REACTION: Desquamation of skin of the palm  . Morphine And Related Nausea And Vomiting  . Sulfonamide Derivatives Rash    Chief Complaint  Patient presents with  . Transitions Of Care    Pt is being seen after a hospital stay at [facility] 11/17 to 11/23 due to pneumonia. Pt reports some ongoing coughing, slight SOB with exertion. Pt also reports no BM since discharge from hospital.   . Falls    Pt had a fall on to walking stick before hosptial stay. pt reports she is still having pain in left side, back, and hip.      HPI: Patient is a 71 y.o. female seen in the office today for hospital follow up. Pt with a medical history significant forhypertension, hyperlipidemia, diabetes, CKD stage V, not yet on dialysis, presenting to the ED with increasing shortness of breath, and nonproductive cough. She was diagnosised with pneumonia. She was placed on 2L o2 and placed on 7 days of levaquin, a right thoracentesis was ordered due to pleural effusion but not enough fluid to collect. Pt had improved at time of discharge.  Reports breathing is "better than it was"  No shortness of breath or cough.  Reports she needs a laxative has not had a bowel movement since Monday.  Still having pain on left side after fall. Rib cage hurts the most.  No taking any medication for this. Has tried tylenol but did not do anything.  Reports pain is a 5/10. Reports  she had an old hydrocodone/apap which helped some. Reports pain is about the same from time of fall which was the first of November.  Reports her kidneys are "stable" goes to see Dr Marval Regal on 03/29/17 to see what is next for her. Starting to retain fluid in her feet and legs. conts to urinate.  Increase itchiness to left lower leg.  Review of Systems:  Review of Systems  Constitutional: Negative for chills, fever and weight loss.  HENT: Negative for tinnitus.   Respiratory: Negative for cough, sputum production, shortness of breath and wheezing.   Cardiovascular: Negative for chest pain, palpitations and leg swelling.  Gastrointestinal: Positive for constipation. Negative for abdominal pain, diarrhea and heartburn.  Genitourinary: Negative for dysuria, frequency and urgency.  Musculoskeletal: Positive for falls and myalgias. Negative for back pain and joint pain.       Pain on left side of rib cage  Skin: Negative.   Neurological: Negative for dizziness and headaches.  Psychiatric/Behavioral: Positive for depression. Negative for suicidal ideas. The patient is nervous/anxious and has insomnia.     Past Medical History:  Diagnosis Date  . Anemia   . Anxiety   . Arthritis    lower back and knees   . Asthma    childhood  . Barrett's esophagus   . Chronic kidney disease   . Depression   . Diabetes mellitus   . Dysphagia   . Eczema   . History of herpes zoster 02/2010  Recovered fully after period of acute herpetic neuralgia tx w/ gabapentin.   Marland Kitchen Hx MRSA infection 2009  . Hyperlipidemia   . Hypertension   . Neuromuscular disorder (HCC)    chronic pain  . Personal history of colonic polyps 10/17/2010   hyperplastic   Past Surgical History:  Procedure Laterality Date  . ABDOMINAL HYSTERECTOMY     unclear when  . AV FISTULA PLACEMENT Left 02/13/2017   Procedure: ARTERIOVENOUS (AV) GORE-TEX STRETCH GRAFT INSERTION INTO LEFT ARM;  Surgeon: Elam Dutch, MD;  Location:  [place];  Service: Vascular;  Laterality: Left;  . CATARACT EXTRACTION Bilateral   . thigh surg     left side due to MRSA   Social History:   reports that she has been smoking cigarettes.  She has a 40.00 pack-year smoking history. she has never used smokeless tobacco. She reports that she drinks about 1.2 oz of alcohol per week. She reports that she does not use drugs.  Family History  Problem Relation Age of Onset  . Hypertension Father   . Kidney disease Father   . Diabetes Brother   . Colon cancer Neg Hx   . CAD Neg Hx     Medications:   Medication List        Accurate as of 03/21/17  2:34 PM. Always use your most recent med list.          ALPRAZolam 0.5 MG tablet Commonly known as:  XANAX Take one tablet by mouth twice daily as needed for anxiety   amLODipine 10 MG tablet Commonly known as:  NORVASC Take 1 tablet (10 mg total) by mouth daily.   aspirin EC 325 MG tablet Take 1 tablet (325 mg total) by mouth daily.   atenolol 25 MG tablet Commonly known as:  TENORMIN Take 1 tablet (25 mg total) by mouth daily.   cloNIDine 0.2 MG tablet Commonly known as:  CATAPRES Take 1 tablet (0.2 mg total) by mouth 2 (two) times daily.   clopidogrel 75 MG tablet Commonly known as:  PLAVIX Take 1 tablet (75 mg total) by mouth daily.   ezetimibe 10 MG tablet Commonly known as:  ZETIA Take 1 tablet (10 mg total) by mouth daily.   latanoprost 0.005 % ophthalmic solution Commonly known as:  XALATAN   Melatonin 3 MG Tabs 6 mg by mouth as needed for sleep.   omeprazole 40 MG capsule Commonly known as:  PRILOSEC   oxyCODONE-acetaminophen 5-325 MG tablet Commonly known as:  ROXICET Take 1 tablet by mouth every 6 (six) hours as needed for severe pain.   PARoxetine 40 MG tablet Commonly known as:  PAXIL Take 1 tablet (40 mg total) by mouth daily.   pravastatin 40 MG tablet Commonly known as:  PRAVACHOL Take 1 tablet (40 mg total) by mouth at bedtime.   sodium  bicarbonate 650 MG tablet Take 1 tablet (650 mg total) by mouth 3 (three) times daily.   SUMAtriptan 25 MG tablet Commonly known as:  IMITREX 1 tablet daily as needed for headache, May repeat in 2 hours if headache persists or recurs. Max of 2 tablets in 24 hours   triamcinolone cream 0.1 % Commonly known as:  KENALOG   Vitamin D3 5000 units Tabs Take 1 tablet (5,000 Units total) by mouth daily.        Physical Exam:  Vitals:   03/21/17 1400  BP: 124/62  Pulse: 62  Resp: 17  Temp: 98.1 F (36.7 C)  TempSrc:  Oral  SpO2: 96%  Weight: 164 lb (74.4 kg)  Height: 5\' 5"  (1.651 m)   Body mass index is 27.29 kg/m.  Physical Exam  Constitutional: She is oriented to person, place, and time. She appears well-developed and well-nourished. No distress.  HENT:  Head: Normocephalic and atraumatic.  Eyes: Conjunctivae and EOM are normal. Pupils are equal, round, and reactive to light.  Cardiovascular: Normal rate, regular rhythm and normal heart sounds.  No murmur heard. Pulmonary/Chest: Effort normal and breath sounds normal. No respiratory distress. She has no wheezes. She exhibits no tenderness.  Abdominal: Soft. Bowel sounds are normal.  Musculoskeletal: She exhibits edema (2+bilaterally).       Arms: Tenderness to ribs on left side   Neurological: She is alert and oriented to person, place, and time.  Skin: Skin is warm and dry.  Psychiatric: Her affect is blunt. Cognition and memory are impaired.    Labs reviewed: Basic Metabolic Panel: Recent Labs    03/09/17 0857  03/13/17 0600 03/14/17 0554 03/15/17 0308  NA 141   < > 142 140 141  K 5.2*   < > 4.0 4.0 4.4  CL 117*   < > 115* 112* 114*  CO2 16*   < > 18* 19* 19*  GLUCOSE 120*   < > 118* 127* 133*  BUN 55*   < > 51* 51* 52*  CREATININE 6.50*   < > 6.93* 6.76* 6.82*  CALCIUM 8.0*  7.9*   < > 8.1* 8.0* 7.9*  PHOS 5.4*  --  5.5* 5.2*  --    < > = values in this interval not displayed.   Liver Function  Tests: Recent Labs    12/03/16 0625  03/09/17 2219 03/11/17 0327 03/13/17 0600 03/14/17 0554  AST 13*  --  19 15  --   --   ALT 13*  --  19 17  --   --   ALKPHOS 102  --  93 87  --   --   BILITOT 0.6  --  0.7 0.5  --   --   PROT 6.3*  --  6.0* 5.8*  --   --   ALBUMIN 3.1*   < > 3.1* 2.8* 2.6* 2.4*   < > = values in this interval not displayed.   No results for input(s): LIPASE, AMYLASE in the last 8760 hours. No results for input(s): AMMONIA in the last 8760 hours. CBC: Recent Labs    12/02/16 0835  03/09/17 2219 03/11/17 0327 03/13/17 0600 03/15/17 0308  WBC 7.7   < > 11.0* 10.5 7.0 8.1  NEUTROABS 4.4  --  5.9  --   --   --   HGB 9.3*   < > 8.4* 8.2* 7.8* 7.7*  HCT 27.9*   < > 26.3* 25.9* 24.4* 23.4*  MCV 86.6   < > 90.1 89.3 88.4 87.6  PLT 237   < > 249 228 215 240   < > = values in this interval not displayed.   Lipid Panel: Recent Labs    05/30/16 0910 09/28/16 0903 12/03/16 0625  CHOL 218* 161 252*  HDL 55 62 45  LDLCALC 130* 77 151*  TRIG 163* 110 281*  CHOLHDL 4.0 2.6 5.6   TSH: No results for input(s): TSH in the last 8760 hours. A1C: Lab Results  Component Value Date   HGBA1C 5.7 (H) 03/10/2017   Dg Chest 2 View  Result Date: 03/11/2017 CLINICAL DATA:  Pneumonia. EXAM: CHEST  2 VIEW COMPARISON:  Chest x-ray dated March 09, 2017. FINDINGS: The patient is rotated to the right. The cardiomediastinal silhouette is unchanged. Patchy consolidation in the right middle and lower lobes with adjacent small pleural effusion, similar to prior study. Trace left pleural effusion is unchanged. No pneumothorax. No acute osseous abnormality. IMPRESSION: Right middle and lower lobe pneumonia with adjacent small pleural effusion, similar to prior study. Electronically Signed   By: Titus Dubin M.D.   On: 03/11/2017 09:06    Assessment/Plan 1. Slow transit constipation Encouraged to start colace with miralax daily   2. 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 Eczema, unspecified type - Crisaborole (EUCRiSA) 2 % OINT; Apply 1 application topically 2 (two) times daily.   4. CKD (chronic kidney disease) stage 5, GFR less than 15 ml/min [facility]) Following with neurology (Dr Verneda Skill) on 03/29/17, AVG placed on 02/13/17  5. Pneumonia of right middle lobe due to infectious organism [facility]) Has completed Levaquin, without ongoing symptoms.   6. Rib pain Ongoing from fall, reports topical asper cream with lidocaine has been beneficial. Discouraged use of narcotics due to side effects. To use heat to affected area 3 times daily followed by cream.   Next appt: 3 months  Yedidya Duddy K. Harle Battiest  [facility] [phone] 8 am - 5 pm) [phone] (after hours)

## 2017-03-21 NOTE — Patient Outreach (Signed)
[facility] [facility]) Care Management  03/21/2017  SHAKEIRA RHEE 12-Jan-1946 [id number]   3rd attempt made to contact member at listed number.  After several rings, female answering phone state member is not available but provides a number where member can be reached ([phone]).  Call then placed to member at provided number.  Identity verified.  This care manager introduced self and purpose of call.  [facility] care management services explained.  She state that she has been doing "alright" since her discharge.  She does not readily accept [facility] involvement, state she has follow up with her primary MD today, appointment with therapist tomorrow.  Request to consider and have this care manager follow up on Friday.  Will follow up on Friday regarding decision to be involved with [facility].  [name], MSN, RN [facility] Care Management  [facility] Manager [phone]

## 2017-03-21 NOTE — Patient Outreach (Signed)
[place] [facility]) Care Management  03/21/2017  [name] April 07, 1946 [id number]  Assessment- CSW completed third and final outreach call to patient. Phone call continuously rang and CSW was unable to leave a voice message. CSW has been unable to reach patient successfully. CSW will mail outreach barrier letter out to patient at this time and wait 10 business days to hear back from patient before closing case.  Plan-CSW will update [facility] RNCM. CSW will await 10 business days to hear back from patient before closing case.  Eula Fried, BSW, MSW, [email or web address] Phone: [phone] Fax: [phone]

## 2017-03-23 ENCOUNTER — Other Ambulatory Visit: Payer: Self-pay | Admitting: *Deleted

## 2017-03-23 DIAGNOSIS — R1310 Dysphagia, unspecified: Secondary | ICD-10-CM | POA: Diagnosis not present

## 2017-03-23 DIAGNOSIS — D631 Anemia in chronic kidney disease: Secondary | ICD-10-CM | POA: Diagnosis not present

## 2017-03-23 DIAGNOSIS — E1122 Type 2 diabetes mellitus with diabetic chronic kidney disease: Secondary | ICD-10-CM | POA: Diagnosis not present

## 2017-03-23 DIAGNOSIS — N185 Chronic kidney disease, stage 5: Secondary | ICD-10-CM | POA: Diagnosis not present

## 2017-03-23 DIAGNOSIS — K2270 Barrett's esophagus without dysplasia: Secondary | ICD-10-CM | POA: Diagnosis not present

## 2017-03-23 DIAGNOSIS — R0781 Pleurodynia: Secondary | ICD-10-CM | POA: Diagnosis not present

## 2017-03-23 DIAGNOSIS — I120 Hypertensive chronic kidney disease with stage 5 chronic kidney disease or end stage renal disease: Secondary | ICD-10-CM | POA: Diagnosis not present

## 2017-03-23 DIAGNOSIS — F329 Major depressive disorder, single episode, unspecified: Secondary | ICD-10-CM | POA: Diagnosis not present

## 2017-03-23 DIAGNOSIS — J189 Pneumonia, unspecified organism: Secondary | ICD-10-CM | POA: Diagnosis not present

## 2017-03-23 NOTE — Patient Outreach (Signed)
[place] [facility]) Care Management  03/23/2017  [name] November 16, 1945 [id number]   Follow up call placed to member after her appointment with primary MD on Wednesday.  She state that she is doing much better and MD did not have any concerns.  She is still reluctant to engage with [facility], has appointment with kidney specialist next week, request another call after that appointment to follow up on engagement.  Will follow up next week, if unable to engage at that time will close case.  [name], MSN, RN [facility] Care Management  [place] Manager [phone]

## 2017-03-26 DIAGNOSIS — N185 Chronic kidney disease, stage 5: Secondary | ICD-10-CM | POA: Diagnosis not present

## 2017-03-26 DIAGNOSIS — R0781 Pleurodynia: Secondary | ICD-10-CM | POA: Diagnosis not present

## 2017-03-26 DIAGNOSIS — F329 Major depressive disorder, single episode, unspecified: Secondary | ICD-10-CM | POA: Diagnosis not present

## 2017-03-26 DIAGNOSIS — R1310 Dysphagia, unspecified: Secondary | ICD-10-CM | POA: Diagnosis not present

## 2017-03-26 DIAGNOSIS — J189 Pneumonia, unspecified organism: Secondary | ICD-10-CM | POA: Diagnosis not present

## 2017-03-26 DIAGNOSIS — K2270 Barrett's esophagus without dysplasia: Secondary | ICD-10-CM | POA: Diagnosis not present

## 2017-03-26 DIAGNOSIS — E1122 Type 2 diabetes mellitus with diabetic chronic kidney disease: Secondary | ICD-10-CM | POA: Diagnosis not present

## 2017-03-26 DIAGNOSIS — D631 Anemia in chronic kidney disease: Secondary | ICD-10-CM | POA: Diagnosis not present

## 2017-03-26 DIAGNOSIS — I120 Hypertensive chronic kidney disease with stage 5 chronic kidney disease or end stage renal disease: Secondary | ICD-10-CM | POA: Diagnosis not present

## 2017-03-29 DIAGNOSIS — N185 Chronic kidney disease, stage 5: Secondary | ICD-10-CM | POA: Diagnosis not present

## 2017-03-29 DIAGNOSIS — I129 Hypertensive chronic kidney disease with stage 1 through stage 4 chronic kidney disease, or unspecified chronic kidney disease: Secondary | ICD-10-CM | POA: Diagnosis not present

## 2017-03-29 DIAGNOSIS — N2581 Secondary hyperparathyroidism of renal origin: Secondary | ICD-10-CM | POA: Diagnosis not present

## 2017-03-29 DIAGNOSIS — J189 Pneumonia, unspecified organism: Secondary | ICD-10-CM | POA: Diagnosis not present

## 2017-03-29 DIAGNOSIS — R1310 Dysphagia, unspecified: Secondary | ICD-10-CM | POA: Diagnosis not present

## 2017-03-29 DIAGNOSIS — E1122 Type 2 diabetes mellitus with diabetic chronic kidney disease: Secondary | ICD-10-CM | POA: Diagnosis not present

## 2017-03-29 DIAGNOSIS — K2270 Barrett's esophagus without dysplasia: Secondary | ICD-10-CM | POA: Diagnosis not present

## 2017-03-29 DIAGNOSIS — I120 Hypertensive chronic kidney disease with stage 5 chronic kidney disease or end stage renal disease: Secondary | ICD-10-CM | POA: Diagnosis not present

## 2017-03-29 DIAGNOSIS — D631 Anemia in chronic kidney disease: Secondary | ICD-10-CM | POA: Diagnosis not present

## 2017-03-29 DIAGNOSIS — I63512 Cerebral infarction due to unspecified occlusion or stenosis of left middle cerebral artery: Secondary | ICD-10-CM | POA: Diagnosis not present

## 2017-03-29 DIAGNOSIS — Z6832 Body mass index (BMI) 32.0-32.9, adult: Secondary | ICD-10-CM | POA: Diagnosis not present

## 2017-03-29 DIAGNOSIS — N179 Acute kidney failure, unspecified: Secondary | ICD-10-CM | POA: Diagnosis not present

## 2017-03-29 DIAGNOSIS — Z992 Dependence on renal dialysis: Secondary | ICD-10-CM | POA: Diagnosis not present

## 2017-03-29 DIAGNOSIS — R0781 Pleurodynia: Secondary | ICD-10-CM | POA: Diagnosis not present

## 2017-03-29 DIAGNOSIS — F329 Major depressive disorder, single episode, unspecified: Secondary | ICD-10-CM | POA: Diagnosis not present

## 2017-03-30 ENCOUNTER — Other Ambulatory Visit: Payer: Self-pay | Admitting: *Deleted

## 2017-03-30 ENCOUNTER — Encounter: Payer: Self-pay | Admitting: *Deleted

## 2017-03-30 NOTE — Patient Outreach (Signed)
[facility]) Care Management  03/30/2017  [name] 02/14/1946 [id number]   Another attempt made (5th attempt total) to contact member to engage in [facility] care management services, no answer.  Unable to leave a message.  Will send outreach letter, if no response in 10 business days will close case.  [name], MSN, RN [facility] Care Management  [facility] Manager [phone]

## 2017-04-04 ENCOUNTER — Other Ambulatory Visit: Payer: Self-pay | Admitting: *Deleted

## 2017-04-04 DIAGNOSIS — I120 Hypertensive chronic kidney disease with stage 5 chronic kidney disease or end stage renal disease: Secondary | ICD-10-CM | POA: Diagnosis not present

## 2017-04-04 DIAGNOSIS — J189 Pneumonia, unspecified organism: Secondary | ICD-10-CM | POA: Diagnosis not present

## 2017-04-04 DIAGNOSIS — K2270 Barrett's esophagus without dysplasia: Secondary | ICD-10-CM | POA: Diagnosis not present

## 2017-04-04 DIAGNOSIS — N185 Chronic kidney disease, stage 5: Secondary | ICD-10-CM | POA: Diagnosis not present

## 2017-04-04 DIAGNOSIS — F329 Major depressive disorder, single episode, unspecified: Secondary | ICD-10-CM | POA: Diagnosis not present

## 2017-04-04 DIAGNOSIS — E1122 Type 2 diabetes mellitus with diabetic chronic kidney disease: Secondary | ICD-10-CM | POA: Diagnosis not present

## 2017-04-04 DIAGNOSIS — R0781 Pleurodynia: Secondary | ICD-10-CM | POA: Diagnosis not present

## 2017-04-04 DIAGNOSIS — R1310 Dysphagia, unspecified: Secondary | ICD-10-CM | POA: Diagnosis not present

## 2017-04-04 DIAGNOSIS — D631 Anemia in chronic kidney disease: Secondary | ICD-10-CM | POA: Diagnosis not present

## 2017-04-04 MED ORDER — OMEPRAZOLE 40 MG PO CPDR: 40.0000 mg | DELAYED_RELEASE_CAPSULE | Freq: Every day | ORAL | 1 refills | Status: DC

## 2017-04-04 NOTE — Telephone Encounter (Signed)
[facility] [employer]

## 2017-04-05 ENCOUNTER — Other Ambulatory Visit (HOSPITAL_COMMUNITY): Payer: Self-pay | Admitting: *Deleted

## 2017-04-05 ENCOUNTER — Telehealth: Payer: Self-pay

## 2017-04-05 NOTE — Telephone Encounter (Signed)
Patient called to see if she could get a medication for a rash that she has had for since her last visit. She stated that the [place] that she was given at last appointment did not work. She states that rash is itchy and located on arms, legs, and buttocks.   Pt would also like a refill of Ambien. This medication was discontinued on 12/02/16 when pt was discharged from [facility]. Please advise if this medication can be refilled.

## 2017-04-05 NOTE — Telephone Encounter (Signed)
I spoke with patient and she verbalized understanding. She stated that she would make an appointment if the rash did not get any better.

## 2017-04-05 NOTE — Telephone Encounter (Signed)
Due to her medical issues she can not be on Ambien. To make sure she is taking her melatonin every evening for sleep.  Also if the eucrisa is not helping rash has she tried triamcinolone cream (KENALOG) 0.1 % which she also has for rash. If she has used this and still without improvement lets get her in to the office so I can evaluate.

## 2017-04-06 ENCOUNTER — Encounter (HOSPITAL_COMMUNITY): Payer: Self-pay

## 2017-04-06 ENCOUNTER — Other Ambulatory Visit: Payer: Self-pay | Admitting: Licensed Clinical Social Worker

## 2017-04-06 DIAGNOSIS — N185 Chronic kidney disease, stage 5: Secondary | ICD-10-CM | POA: Diagnosis not present

## 2017-04-06 DIAGNOSIS — D631 Anemia in chronic kidney disease: Secondary | ICD-10-CM | POA: Diagnosis not present

## 2017-04-06 DIAGNOSIS — R0781 Pleurodynia: Secondary | ICD-10-CM | POA: Diagnosis not present

## 2017-04-06 DIAGNOSIS — I120 Hypertensive chronic kidney disease with stage 5 chronic kidney disease or end stage renal disease: Secondary | ICD-10-CM | POA: Diagnosis not present

## 2017-04-06 DIAGNOSIS — F329 Major depressive disorder, single episode, unspecified: Secondary | ICD-10-CM | POA: Diagnosis not present

## 2017-04-06 DIAGNOSIS — K2270 Barrett's esophagus without dysplasia: Secondary | ICD-10-CM | POA: Diagnosis not present

## 2017-04-06 DIAGNOSIS — R1310 Dysphagia, unspecified: Secondary | ICD-10-CM | POA: Diagnosis not present

## 2017-04-06 DIAGNOSIS — E1122 Type 2 diabetes mellitus with diabetic chronic kidney disease: Secondary | ICD-10-CM | POA: Diagnosis not present

## 2017-04-06 DIAGNOSIS — J189 Pneumonia, unspecified organism: Secondary | ICD-10-CM | POA: Diagnosis not present

## 2017-04-06 NOTE — Patient Outreach (Signed)
[facility]) Care Management  04/06/2017  LAKYNN HALVORSEN 03-14-1946 [id number]  Assessment- CSW has been unable to establish contact with patient. [facility] RNCM has spoke with patient but patient has been very hesitant to engage in [place]. CSW has completed three outreach calls and sent outreach barrier letter to patient but has not heard back. CSW will complete social work discharge at this time.  CSW updated [facility] RNCM through in basket staff message.  Plan-CSW will discharge patient from caseload at this time.  Eula Fried, BSW, MSW, [email or web address] Phone: [phone] Fax: [phone]

## 2017-04-09 DIAGNOSIS — F329 Major depressive disorder, single episode, unspecified: Secondary | ICD-10-CM | POA: Diagnosis not present

## 2017-04-09 DIAGNOSIS — E1122 Type 2 diabetes mellitus with diabetic chronic kidney disease: Secondary | ICD-10-CM | POA: Diagnosis not present

## 2017-04-09 DIAGNOSIS — K2270 Barrett's esophagus without dysplasia: Secondary | ICD-10-CM | POA: Diagnosis not present

## 2017-04-09 DIAGNOSIS — J189 Pneumonia, unspecified organism: Secondary | ICD-10-CM | POA: Diagnosis not present

## 2017-04-09 DIAGNOSIS — D631 Anemia in chronic kidney disease: Secondary | ICD-10-CM | POA: Diagnosis not present

## 2017-04-09 DIAGNOSIS — R1310 Dysphagia, unspecified: Secondary | ICD-10-CM | POA: Diagnosis not present

## 2017-04-09 DIAGNOSIS — N185 Chronic kidney disease, stage 5: Secondary | ICD-10-CM | POA: Diagnosis not present

## 2017-04-09 DIAGNOSIS — R0781 Pleurodynia: Secondary | ICD-10-CM | POA: Diagnosis not present

## 2017-04-09 DIAGNOSIS — I120 Hypertensive chronic kidney disease with stage 5 chronic kidney disease or end stage renal disease: Secondary | ICD-10-CM | POA: Diagnosis not present

## 2017-04-10 ENCOUNTER — Ambulatory Visit: Payer: Medicare HMO | Admitting: Nurse Practitioner

## 2017-04-10 ENCOUNTER — Encounter: Payer: Self-pay | Admitting: Nurse Practitioner

## 2017-04-10 VITALS — BP 120/78 | HR 66 | Temp 97.2°F | Resp 20 | Ht 65.0 in | Wt 168.4 lb

## 2017-04-10 DIAGNOSIS — L309 Dermatitis, unspecified: Secondary | ICD-10-CM | POA: Diagnosis not present

## 2017-04-10 MED ORDER — METHYLPREDNISOLONE ACETATE 40 MG/ML IJ SUSP
40.0000 mg | Freq: Once | INTRAMUSCULAR | Status: AC
  Administered 2017-04-10: 40 mg via INTRAMUSCULAR

## 2017-04-10 MED ORDER — PREDNISONE 10 MG (21) PO TBPK: ORAL_TABLET | ORAL | 0 refills | Status: DC

## 2017-04-10 NOTE — Patient Instructions (Addendum)
Injection given today To take prednisone dose pack as well- start this tomorrow  To use gold bond anti-itch lotion To cut finger nails and try not to scratch areas

## 2017-04-10 NOTE — Progress Notes (Signed)
Careteam: Patient Care Team: [name], NP as PCP - General (Nurse Practitioner) Woodroe Mode (Inactive) as Diabetes Educator [name], MD as Consulting Physician (Ophthalmology) [name], MD as Consulting Physician (Nephrology) [name], RN as [place] Management  Advanced Directive information    Allergies  Allergen Reactions  . Lisinopril Other (See Comments)    Abnormal Kidney Function   . Pioglitazone Other (See Comments)    REACTION: Desquamation of skin of the palm  . Morphine And Related Nausea And Vomiting  . Sulfonamide Derivatives Rash    Chief Complaint  Patient presents with  . Acute Visit    Patients c/o rash has gotten worse  now on whole body     HPI: Patient is a 71 y.o. female seen in the office today due to persistent itchy patches on bilateral legs, back and arms.  No itching to wrist or between fingers.  Has used triamcinolone 0.1% until it ran out- reports she used it all day long for 4 days until it ran out.  No pain just very uncomfortable due to itch.  Used eucrasia but it did not help as well Has long finger nails and conts to scratch areas and now with excoriation.   Review of Systems:  Review of Systems  Constitutional: Negative for chills, fever and malaise/fatigue.  Respiratory: Negative for cough and shortness of breath.   Cardiovascular: Negative for chest pain and leg swelling.  Skin: Positive for itching and rash.  Neurological: Negative for weakness.    Past Medical History:  Diagnosis Date  . Anemia   . Anxiety   . Arthritis    lower back and knees   . Asthma    childhood  . Barrett's esophagus   . Chronic kidney disease   . Depression   . Diabetes mellitus   . Dysphagia   . Eczema   . History of herpes zoster 02/2010   Recovered fully after period of acute herpetic neuralgia tx w/ gabapentin.   Marland Kitchen Hx MRSA infection 2009  . Hyperlipidemia   . Hypertension   .  Neuromuscular disorder (HCC)    chronic pain  . Personal history of colonic polyps 10/17/2010   hyperplastic   Past Surgical History:  Procedure Laterality Date  . ABDOMINAL HYSTERECTOMY     unclear when  . AV FISTULA PLACEMENT Left 02/13/2017   Procedure: ARTERIOVENOUS (AV) GORE-TEX STRETCH GRAFT INSERTION INTO LEFT ARM;  Surgeon: Elam Dutch, MD;  Location: [place];  Service: Vascular;  Laterality: Left;  . CATARACT EXTRACTION Bilateral   . thigh surg     left side due to MRSA   Social History:   reports that she has been smoking cigarettes.  She has a 40.00 pack-year smoking history. she has never used smokeless tobacco. She reports that she drinks about 1.2 oz of alcohol per week. She reports that she does not use drugs.  Family History  Problem Relation Age of Onset  . Hypertension Father   . Kidney disease Father   . Diabetes Brother   . Colon cancer Neg Hx   . CAD Neg Hx     Medications:   Medication List        Accurate as of 04/10/17  3:49 PM. Always use your most recent med list.          ALPRAZolam 0.5 MG tablet Commonly known as:  XANAX Take one tablet by mouth twice daily as needed for anxiety  amLODipine 10 MG tablet Commonly known as:  NORVASC Take 1 tablet (10 mg total) by mouth daily.   aspirin EC 325 MG tablet Take 1 tablet (325 mg total) by mouth daily.   atenolol 25 MG tablet Commonly known as:  TENORMIN Take 1 tablet (25 mg total) by mouth daily.   cloNIDine 0.2 MG tablet Commonly known as:  CATAPRES Take 1 tablet (0.2 mg total) by mouth 2 (two) times daily.   clopidogrel 75 MG tablet Commonly known as:  PLAVIX Take 1 tablet (75 mg total) by mouth daily.   Crisaborole 2 % Oint Commonly known as:  EUCRISA Apply 1 application topically 2 (two) times daily.   ezetimibe 10 MG tablet Commonly known as:  ZETIA Take 1 tablet (10 mg total) by mouth daily.   latanoprost 0.005 % ophthalmic solution Commonly known as:  XALATAN     Melatonin 3 MG Tabs 6 mg by mouth as needed for sleep.   omeprazole 40 MG capsule Commonly known as:  PRILOSEC Take 1 capsule (40 mg total) by mouth daily.   oxyCODONE-acetaminophen 5-325 MG tablet Commonly known as:  ROXICET Take 1 tablet by mouth every 6 (six) hours as needed for severe pain.   PARoxetine 40 MG tablet Commonly known as:  PAXIL Take 1 tablet (40 mg total) by mouth daily.   pravastatin 40 MG tablet Commonly known as:  PRAVACHOL Take 1 tablet (40 mg total) by mouth at bedtime.   sodium bicarbonate 650 MG tablet Take 1 tablet (650 mg total) by mouth 3 (three) times daily.   SUMAtriptan 25 MG tablet Commonly known as:  IMITREX 1 tablet daily as needed for headache, May repeat in 2 hours if headache persists or recurs. Max of 2 tablets in 24 hours   triamcinolone cream 0.1 % Commonly known as:  KENALOG   Vitamin D3 5000 units Tabs Take 1 tablet (5,000 Units total) by mouth daily.        Physical Exam:  Vitals:   04/10/17 1535  BP: 120/78  Pulse: 66  Resp: 20  Temp: (!) 97.2 F (36.2 C)  TempSrc: Oral  SpO2: 98%  Weight: 168 lb 6.4 oz (76.4 kg)  Height: 5\' 5"  (1.651 m)   Body mass index is 28.02 kg/m.  Physical Exam  Constitutional: She appears well-developed and well-nourished.  HENT:  Head: Normocephalic and atraumatic.  Right Ear: External ear normal.  Left Ear: External ear normal.  Eyes: Pupils are equal, round, and reactive to light.  Cardiovascular: Normal rate and regular rhythm.  Pulmonary/Chest: Effort normal and breath sounds normal.  Skin: Rash noted. There is erythema.  Patches noted to left lower extremity, right arm (at elbow) and back with raised red bumps. excoriation noted. No signs of infection noted.     Labs reviewed: Basic Metabolic Panel: Recent Labs    03/09/17 0857  03/13/17 0600 03/14/17 0554 03/15/17 0308  NA 141   < > 142 140 141  K 5.2*   < > 4.0 4.0 4.4  CL 117*   < > 115* 112* 114*  CO2 16*   < >  18* 19* 19*  GLUCOSE 120*   < > 118* 127* 133*  BUN 55*   < > 51* 51* 52*  CREATININE 6.50*   < > 6.93* 6.76* 6.82*  CALCIUM 8.0*  7.9*   < > 8.1* 8.0* 7.9*  PHOS 5.4*  --  5.5* 5.2*  --    < > = values in this  interval not displayed.   Liver Function Tests: Recent Labs    12/03/16 0625  03/09/17 2219 03/11/17 0327 03/13/17 0600 03/14/17 0554  AST 13*  --  19 15  --   --   ALT 13*  --  19 17  --   --   ALKPHOS 102  --  93 87  --   --   BILITOT 0.6  --  0.7 0.5  --   --   PROT 6.3*  --  6.0* 5.8*  --   --   ALBUMIN 3.1*   < > 3.1* 2.8* 2.6* 2.4*   < > = values in this interval not displayed.   No results for input(s): LIPASE, AMYLASE in the last 8760 hours. No results for input(s): AMMONIA in the last 8760 hours. CBC: Recent Labs    12/02/16 0835  03/09/17 2219 03/11/17 0327 03/13/17 0600 03/15/17 0308  WBC 7.7   < > 11.0* 10.5 7.0 8.1  NEUTROABS 4.4  --  5.9  --   --   --   HGB 9.3*   < > 8.4* 8.2* 7.8* 7.7*  HCT 27.9*   < > 26.3* 25.9* 24.4* 23.4*  MCV 86.6   < > 90.1 89.3 88.4 87.6  PLT 237   < > 249 228 215 240   < > = values in this interval not displayed.   Lipid Panel: Recent Labs    05/30/16 0910 09/28/16 0903 12/03/16 0625  CHOL 218* 161 252*  HDL 55 62 45  LDLCALC 130* 77 151*  TRIG 163* 110 281*  CHOLHDL 4.0 2.6 5.6   TSH: No results for input(s): TSH in the last 8760 hours. A1C: Lab Results  Component Value Date   HGBA1C 5.7 (H) 03/10/2017     Assessment/Plan 1. Acute eczema -encouraged to cut finger nails and avoiding scratching area -to use gold bond anti-itch -discussed with Dr Eulas Post, Depo-medrol 40 mg IM given in office with prednisone taper to start tomorrow due to severity. - predniSONE (STERAPRED UNI-PAK 21 TAB) 10 MG (21) TBPK tablet; Use as directed  Dispense: 21 tablet; Refill: 0 -will need dermatology referral if no improvement.   Next appt: as scheduled, sooner if needed Jaceon Heiberger K. Harle Battiest  [facility] [phone] 8 am - 5 pm) [phone] (after hours)

## 2017-04-11 DIAGNOSIS — E875 Hyperkalemia: Secondary | ICD-10-CM | POA: Diagnosis not present

## 2017-04-13 ENCOUNTER — Other Ambulatory Visit: Payer: Self-pay | Admitting: *Deleted

## 2017-04-13 ENCOUNTER — Telehealth: Payer: Self-pay | Admitting: *Deleted

## 2017-04-13 ENCOUNTER — Encounter: Payer: Self-pay | Admitting: *Deleted

## 2017-04-13 DIAGNOSIS — R1310 Dysphagia, unspecified: Secondary | ICD-10-CM | POA: Diagnosis not present

## 2017-04-13 DIAGNOSIS — D631 Anemia in chronic kidney disease: Secondary | ICD-10-CM | POA: Diagnosis not present

## 2017-04-13 DIAGNOSIS — N185 Chronic kidney disease, stage 5: Secondary | ICD-10-CM | POA: Diagnosis not present

## 2017-04-13 DIAGNOSIS — F329 Major depressive disorder, single episode, unspecified: Secondary | ICD-10-CM | POA: Diagnosis not present

## 2017-04-13 DIAGNOSIS — I120 Hypertensive chronic kidney disease with stage 5 chronic kidney disease or end stage renal disease: Secondary | ICD-10-CM | POA: Diagnosis not present

## 2017-04-13 DIAGNOSIS — J189 Pneumonia, unspecified organism: Secondary | ICD-10-CM | POA: Diagnosis not present

## 2017-04-13 DIAGNOSIS — R0781 Pleurodynia: Secondary | ICD-10-CM | POA: Diagnosis not present

## 2017-04-13 DIAGNOSIS — E1122 Type 2 diabetes mellitus with diabetic chronic kidney disease: Secondary | ICD-10-CM | POA: Diagnosis not present

## 2017-04-13 DIAGNOSIS — K2270 Barrett's esophagus without dysplasia: Secondary | ICD-10-CM | POA: Diagnosis not present

## 2017-04-13 NOTE — Telephone Encounter (Signed)
Noted, she has ESRD and needs HD, she needs to apply TED hose and elevate legs when tolerated. Also to avoid sodium

## 2017-04-13 NOTE — Patient Outreach (Signed)
[facility]) Care Management  04/13/2017  [name] 01/07/1946 [id number]   Unsuccessful outreach letter sent to member on 12/7, no response.  Will close case at this time.  Will notify care management assistant and primary MD of case closure.   [name], [place], MSN [place] [phone]

## 2017-04-13 NOTE — Telephone Encounter (Signed)
Amber with [facility] called and stated that patient has edema in her feet times 1 week. Patient is taking Furosemide but the swelling is still there. Nurse just calling to let you know. No other symptoms or concerns.

## 2017-04-13 NOTE — Telephone Encounter (Signed)
Amber notified and agreed. She will relay to patient.

## 2017-04-16 DIAGNOSIS — E875 Hyperkalemia: Secondary | ICD-10-CM | POA: Diagnosis not present

## 2017-04-18 DIAGNOSIS — R1310 Dysphagia, unspecified: Secondary | ICD-10-CM | POA: Diagnosis not present

## 2017-04-18 DIAGNOSIS — F329 Major depressive disorder, single episode, unspecified: Secondary | ICD-10-CM | POA: Diagnosis not present

## 2017-04-18 DIAGNOSIS — K2270 Barrett's esophagus without dysplasia: Secondary | ICD-10-CM | POA: Diagnosis not present

## 2017-04-18 DIAGNOSIS — D631 Anemia in chronic kidney disease: Secondary | ICD-10-CM | POA: Diagnosis not present

## 2017-04-18 DIAGNOSIS — N185 Chronic kidney disease, stage 5: Secondary | ICD-10-CM | POA: Diagnosis not present

## 2017-04-18 DIAGNOSIS — I120 Hypertensive chronic kidney disease with stage 5 chronic kidney disease or end stage renal disease: Secondary | ICD-10-CM | POA: Diagnosis not present

## 2017-04-18 DIAGNOSIS — R0781 Pleurodynia: Secondary | ICD-10-CM | POA: Diagnosis not present

## 2017-04-18 DIAGNOSIS — E1122 Type 2 diabetes mellitus with diabetic chronic kidney disease: Secondary | ICD-10-CM | POA: Diagnosis not present

## 2017-04-18 DIAGNOSIS — J189 Pneumonia, unspecified organism: Secondary | ICD-10-CM | POA: Diagnosis not present

## 2017-04-20 DIAGNOSIS — F329 Major depressive disorder, single episode, unspecified: Secondary | ICD-10-CM | POA: Diagnosis not present

## 2017-04-20 DIAGNOSIS — D631 Anemia in chronic kidney disease: Secondary | ICD-10-CM | POA: Diagnosis not present

## 2017-04-20 DIAGNOSIS — E1122 Type 2 diabetes mellitus with diabetic chronic kidney disease: Secondary | ICD-10-CM | POA: Diagnosis not present

## 2017-04-20 DIAGNOSIS — K2270 Barrett's esophagus without dysplasia: Secondary | ICD-10-CM | POA: Diagnosis not present

## 2017-04-20 DIAGNOSIS — R1310 Dysphagia, unspecified: Secondary | ICD-10-CM | POA: Diagnosis not present

## 2017-04-20 DIAGNOSIS — R0781 Pleurodynia: Secondary | ICD-10-CM | POA: Diagnosis not present

## 2017-04-20 DIAGNOSIS — J189 Pneumonia, unspecified organism: Secondary | ICD-10-CM | POA: Diagnosis not present

## 2017-04-20 DIAGNOSIS — I120 Hypertensive chronic kidney disease with stage 5 chronic kidney disease or end stage renal disease: Secondary | ICD-10-CM | POA: Diagnosis not present

## 2017-04-20 DIAGNOSIS — N185 Chronic kidney disease, stage 5: Secondary | ICD-10-CM | POA: Diagnosis not present

## 2017-04-23 DIAGNOSIS — I120 Hypertensive chronic kidney disease with stage 5 chronic kidney disease or end stage renal disease: Secondary | ICD-10-CM | POA: Diagnosis not present

## 2017-04-23 DIAGNOSIS — R0781 Pleurodynia: Secondary | ICD-10-CM | POA: Diagnosis not present

## 2017-04-23 DIAGNOSIS — D631 Anemia in chronic kidney disease: Secondary | ICD-10-CM | POA: Diagnosis not present

## 2017-04-23 DIAGNOSIS — K2270 Barrett's esophagus without dysplasia: Secondary | ICD-10-CM | POA: Diagnosis not present

## 2017-04-23 DIAGNOSIS — N185 Chronic kidney disease, stage 5: Secondary | ICD-10-CM | POA: Diagnosis not present

## 2017-04-23 DIAGNOSIS — F329 Major depressive disorder, single episode, unspecified: Secondary | ICD-10-CM | POA: Diagnosis not present

## 2017-04-23 DIAGNOSIS — J189 Pneumonia, unspecified organism: Secondary | ICD-10-CM | POA: Diagnosis not present

## 2017-04-23 DIAGNOSIS — R1310 Dysphagia, unspecified: Secondary | ICD-10-CM | POA: Diagnosis not present

## 2017-04-23 DIAGNOSIS — E1122 Type 2 diabetes mellitus with diabetic chronic kidney disease: Secondary | ICD-10-CM | POA: Diagnosis not present

## 2017-04-25 DIAGNOSIS — F329 Major depressive disorder, single episode, unspecified: Secondary | ICD-10-CM | POA: Diagnosis not present

## 2017-04-25 DIAGNOSIS — D631 Anemia in chronic kidney disease: Secondary | ICD-10-CM | POA: Diagnosis not present

## 2017-04-25 DIAGNOSIS — R0781 Pleurodynia: Secondary | ICD-10-CM | POA: Diagnosis not present

## 2017-04-25 DIAGNOSIS — I120 Hypertensive chronic kidney disease with stage 5 chronic kidney disease or end stage renal disease: Secondary | ICD-10-CM | POA: Diagnosis not present

## 2017-04-25 DIAGNOSIS — N185 Chronic kidney disease, stage 5: Secondary | ICD-10-CM | POA: Diagnosis not present

## 2017-04-25 DIAGNOSIS — E1122 Type 2 diabetes mellitus with diabetic chronic kidney disease: Secondary | ICD-10-CM | POA: Diagnosis not present

## 2017-04-25 DIAGNOSIS — J189 Pneumonia, unspecified organism: Secondary | ICD-10-CM | POA: Diagnosis not present

## 2017-04-25 DIAGNOSIS — K2270 Barrett's esophagus without dysplasia: Secondary | ICD-10-CM | POA: Diagnosis not present

## 2017-04-25 DIAGNOSIS — R1310 Dysphagia, unspecified: Secondary | ICD-10-CM | POA: Diagnosis not present

## 2017-04-26 DIAGNOSIS — R0781 Pleurodynia: Secondary | ICD-10-CM | POA: Diagnosis not present

## 2017-04-26 DIAGNOSIS — F329 Major depressive disorder, single episode, unspecified: Secondary | ICD-10-CM | POA: Diagnosis not present

## 2017-04-26 DIAGNOSIS — K2270 Barrett's esophagus without dysplasia: Secondary | ICD-10-CM | POA: Diagnosis not present

## 2017-04-26 DIAGNOSIS — R1310 Dysphagia, unspecified: Secondary | ICD-10-CM | POA: Diagnosis not present

## 2017-04-26 DIAGNOSIS — D631 Anemia in chronic kidney disease: Secondary | ICD-10-CM | POA: Diagnosis not present

## 2017-04-26 DIAGNOSIS — N185 Chronic kidney disease, stage 5: Secondary | ICD-10-CM | POA: Diagnosis not present

## 2017-04-26 DIAGNOSIS — I120 Hypertensive chronic kidney disease with stage 5 chronic kidney disease or end stage renal disease: Secondary | ICD-10-CM | POA: Diagnosis not present

## 2017-04-26 DIAGNOSIS — E1122 Type 2 diabetes mellitus with diabetic chronic kidney disease: Secondary | ICD-10-CM | POA: Diagnosis not present

## 2017-04-26 DIAGNOSIS — J189 Pneumonia, unspecified organism: Secondary | ICD-10-CM | POA: Diagnosis not present

## 2017-05-03 DIAGNOSIS — D631 Anemia in chronic kidney disease: Secondary | ICD-10-CM | POA: Diagnosis not present

## 2017-05-03 DIAGNOSIS — N2581 Secondary hyperparathyroidism of renal origin: Secondary | ICD-10-CM | POA: Diagnosis not present

## 2017-05-03 DIAGNOSIS — N185 Chronic kidney disease, stage 5: Secondary | ICD-10-CM | POA: Diagnosis not present

## 2017-05-03 DIAGNOSIS — E875 Hyperkalemia: Secondary | ICD-10-CM | POA: Diagnosis not present

## 2017-05-03 DIAGNOSIS — E1122 Type 2 diabetes mellitus with diabetic chronic kidney disease: Secondary | ICD-10-CM | POA: Diagnosis not present

## 2017-05-03 DIAGNOSIS — N189 Chronic kidney disease, unspecified: Secondary | ICD-10-CM | POA: Diagnosis not present

## 2017-05-03 DIAGNOSIS — N179 Acute kidney failure, unspecified: Secondary | ICD-10-CM | POA: Diagnosis not present

## 2017-05-03 DIAGNOSIS — Z992 Dependence on renal dialysis: Secondary | ICD-10-CM | POA: Diagnosis not present

## 2017-05-03 DIAGNOSIS — I129 Hypertensive chronic kidney disease with stage 1 through stage 4 chronic kidney disease, or unspecified chronic kidney disease: Secondary | ICD-10-CM | POA: Diagnosis not present

## 2017-05-03 DIAGNOSIS — Z720 Tobacco use: Secondary | ICD-10-CM | POA: Diagnosis not present

## 2017-05-05 ENCOUNTER — Emergency Department (HOSPITAL_COMMUNITY): Payer: Medicare HMO

## 2017-05-05 ENCOUNTER — Other Ambulatory Visit: Payer: Self-pay

## 2017-05-05 ENCOUNTER — Inpatient Hospital Stay (HOSPITAL_COMMUNITY)
Admission: EM | Admit: 2017-05-05 | Discharge: 2017-05-12 | DRG: 640 | Disposition: A | Discharge status: Home / Self Care | Payer: Medicare HMO | Attending: Internal Medicine | Admitting: Internal Medicine

## 2017-05-05 ENCOUNTER — Encounter (HOSPITAL_COMMUNITY): Payer: Self-pay | Admitting: Emergency Medicine

## 2017-05-05 DIAGNOSIS — E872 Acidosis: Secondary | ICD-10-CM | POA: Diagnosis not present

## 2017-05-05 DIAGNOSIS — R51 Headache: Secondary | ICD-10-CM | POA: Diagnosis not present

## 2017-05-05 DIAGNOSIS — Z833 Family history of diabetes mellitus: Secondary | ICD-10-CM

## 2017-05-05 DIAGNOSIS — Z90710 Acquired absence of both cervix and uterus: Secondary | ICD-10-CM

## 2017-05-05 DIAGNOSIS — R42 Dizziness and giddiness: Secondary | ICD-10-CM | POA: Diagnosis not present

## 2017-05-05 DIAGNOSIS — I120 Hypertensive chronic kidney disease with stage 5 chronic kidney disease or end stage renal disease: Secondary | ICD-10-CM | POA: Diagnosis not present

## 2017-05-05 DIAGNOSIS — J189 Pneumonia, unspecified organism: Secondary | ICD-10-CM | POA: Diagnosis not present

## 2017-05-05 DIAGNOSIS — Z8249 Family history of ischemic heart disease and other diseases of the circulatory system: Secondary | ICD-10-CM | POA: Diagnosis not present

## 2017-05-05 DIAGNOSIS — R0602 Shortness of breath: Secondary | ICD-10-CM | POA: Diagnosis not present

## 2017-05-05 DIAGNOSIS — Y95 Nosocomial condition: Secondary | ICD-10-CM | POA: Diagnosis present

## 2017-05-05 DIAGNOSIS — Z9841 Cataract extraction status, right eye: Secondary | ICD-10-CM

## 2017-05-05 DIAGNOSIS — E8770 Fluid overload, unspecified: Secondary | ICD-10-CM | POA: Diagnosis present

## 2017-05-05 DIAGNOSIS — E875 Hyperkalemia: Principal | ICD-10-CM | POA: Diagnosis present

## 2017-05-05 DIAGNOSIS — Z86010 Personal history of colonic polyps: Secondary | ICD-10-CM | POA: Diagnosis not present

## 2017-05-05 DIAGNOSIS — E785 Hyperlipidemia, unspecified: Secondary | ICD-10-CM | POA: Diagnosis present

## 2017-05-05 DIAGNOSIS — N185 Chronic kidney disease, stage 5: Secondary | ICD-10-CM | POA: Diagnosis not present

## 2017-05-05 DIAGNOSIS — Z9842 Cataract extraction status, left eye: Secondary | ICD-10-CM

## 2017-05-05 DIAGNOSIS — K219 Gastro-esophageal reflux disease without esophagitis: Secondary | ICD-10-CM | POA: Diagnosis present

## 2017-05-05 DIAGNOSIS — Z888 Allergy status to other drugs, medicaments and biological substances status: Secondary | ICD-10-CM

## 2017-05-05 DIAGNOSIS — L309 Dermatitis, unspecified: Secondary | ICD-10-CM | POA: Diagnosis present

## 2017-05-05 DIAGNOSIS — I10 Essential (primary) hypertension: Secondary | ICD-10-CM | POA: Diagnosis not present

## 2017-05-05 DIAGNOSIS — J810 Acute pulmonary edema: Secondary | ICD-10-CM | POA: Diagnosis not present

## 2017-05-05 DIAGNOSIS — E1151 Type 2 diabetes mellitus with diabetic peripheral angiopathy without gangrene: Secondary | ICD-10-CM | POA: Diagnosis present

## 2017-05-05 DIAGNOSIS — G8929 Other chronic pain: Secondary | ICD-10-CM | POA: Diagnosis present

## 2017-05-05 DIAGNOSIS — N186 End stage renal disease: Secondary | ICD-10-CM | POA: Diagnosis present

## 2017-05-05 DIAGNOSIS — Z841 Family history of disorders of kidney and ureter: Secondary | ICD-10-CM

## 2017-05-05 DIAGNOSIS — I160 Hypertensive urgency: Secondary | ICD-10-CM | POA: Diagnosis not present

## 2017-05-05 DIAGNOSIS — Z7982 Long term (current) use of aspirin: Secondary | ICD-10-CM | POA: Diagnosis not present

## 2017-05-05 DIAGNOSIS — Z8614 Personal history of Methicillin resistant Staphylococcus aureus infection: Secondary | ICD-10-CM | POA: Diagnosis not present

## 2017-05-05 DIAGNOSIS — J9601 Acute respiratory failure with hypoxia: Secondary | ICD-10-CM | POA: Diagnosis present

## 2017-05-05 DIAGNOSIS — Z992 Dependence on renal dialysis: Secondary | ICD-10-CM | POA: Diagnosis not present

## 2017-05-05 DIAGNOSIS — F419 Anxiety disorder, unspecified: Secondary | ICD-10-CM | POA: Diagnosis present

## 2017-05-05 DIAGNOSIS — Z882 Allergy status to sulfonamides status: Secondary | ICD-10-CM

## 2017-05-05 DIAGNOSIS — I1311 Hypertensive heart and chronic kidney disease without heart failure, with stage 5 chronic kidney disease, or end stage renal disease: Secondary | ICD-10-CM | POA: Diagnosis not present

## 2017-05-05 DIAGNOSIS — M1990 Unspecified osteoarthritis, unspecified site: Secondary | ICD-10-CM | POA: Diagnosis present

## 2017-05-05 DIAGNOSIS — Z8673 Personal history of transient ischemic attack (TIA), and cerebral infarction without residual deficits: Secondary | ICD-10-CM

## 2017-05-05 DIAGNOSIS — R05 Cough: Secondary | ICD-10-CM | POA: Diagnosis not present

## 2017-05-05 DIAGNOSIS — Z885 Allergy status to narcotic agent status: Secondary | ICD-10-CM

## 2017-05-05 DIAGNOSIS — E1122 Type 2 diabetes mellitus with diabetic chronic kidney disease: Secondary | ICD-10-CM | POA: Diagnosis not present

## 2017-05-05 DIAGNOSIS — N189 Chronic kidney disease, unspecified: Secondary | ICD-10-CM

## 2017-05-05 DIAGNOSIS — Z7902 Long term (current) use of antithrombotics/antiplatelets: Secondary | ICD-10-CM

## 2017-05-05 DIAGNOSIS — F17210 Nicotine dependence, cigarettes, uncomplicated: Secondary | ICD-10-CM | POA: Diagnosis present

## 2017-05-05 DIAGNOSIS — D631 Anemia in chronic kidney disease: Secondary | ICD-10-CM | POA: Diagnosis present

## 2017-05-05 DIAGNOSIS — R1310 Dysphagia, unspecified: Secondary | ICD-10-CM | POA: Diagnosis present

## 2017-05-05 LAB — CBC
HCT: 26 % — ABNORMAL LOW (ref 36.0–46.0)
Hemoglobin: 8.6 g/dL — ABNORMAL LOW (ref 12.0–15.0)
MCH: 29.4 pg (ref 26.0–34.0)
MCHC: 33.1 g/dL (ref 30.0–36.0)
MCV: 88.7 fL (ref 78.0–100.0)
PLATELETS: 168 10*3/uL (ref 150–400)
RBC: 2.93 MIL/uL — ABNORMAL LOW (ref 3.87–5.11)
RDW: 16.3 % — AB (ref 11.5–15.5)
WBC: 9.2 10*3/uL (ref 4.0–10.5)

## 2017-05-05 LAB — BASIC METABOLIC PANEL
Anion gap: 10 (ref 5–15)
BUN: 64 mg/dL — AB (ref 6–20)
CO2: 17 mmol/L — ABNORMAL LOW (ref 22–32)
CREATININE: 6.61 mg/dL — AB (ref 0.44–1.00)
Calcium: 8.4 mg/dL — ABNORMAL LOW (ref 8.9–10.3)
Chloride: 112 mmol/L — ABNORMAL HIGH (ref 101–111)
GFR, EST AFRICAN AMERICAN: 7 mL/min — AB (ref >60)
GFR, EST NON AFRICAN AMERICAN: 6 mL/min — AB (ref >60)
Glucose, Bld: 102 mg/dL — ABNORMAL HIGH (ref 65–99)
Potassium: 6 mmol/L — ABNORMAL HIGH (ref 3.5–5.1)
SODIUM: 139 mmol/L (ref 135–145)

## 2017-05-05 LAB — I-STAT TROPONIN, ED: Troponin i, poc: 0.03 ng/mL (ref 0.00–0.08)

## 2017-05-05 LAB — POTASSIUM: POTASSIUM: 7.5 mmol/L — AB (ref 3.5–5.1)

## 2017-05-05 MED ORDER — LATANOPROST 0.005 % OP SOLN
1.0000 [drp] | Freq: Every day | OPHTHALMIC | Status: DC
  Administered 2017-05-07 – 2017-05-12 (×5): 1 [drp] via OPHTHALMIC
  Filled 2017-05-05 (×2): qty 2.5

## 2017-05-05 MED ORDER — ASPIRIN EC 325 MG PO TBEC
325.0000 mg | DELAYED_RELEASE_TABLET | Freq: Every day | ORAL | Status: DC
  Administered 2017-05-06 – 2017-05-08 (×3): 325 mg via ORAL
  Filled 2017-05-05 (×4): qty 1

## 2017-05-05 MED ORDER — EZETIMIBE 10 MG PO TABS
10.0000 mg | ORAL_TABLET | Freq: Every day | ORAL | Status: DC
  Administered 2017-05-06 – 2017-05-12 (×7): 10 mg via ORAL
  Filled 2017-05-05 (×7): qty 1

## 2017-05-05 MED ORDER — SODIUM POLYSTYRENE SULFONATE 15 GM/60ML PO SUSP
30.0000 g | Freq: Once | ORAL | Status: AC
  Administered 2017-05-05: 30 g via ORAL
  Filled 2017-05-05: qty 120

## 2017-05-05 MED ORDER — DEXTROSE 5 % IV SOLN
1.0000 g | INTRAVENOUS | Status: DC
  Filled 2017-05-05: qty 1

## 2017-05-05 MED ORDER — ACETAMINOPHEN 325 MG PO TABS
650.0000 mg | ORAL_TABLET | ORAL | Status: DC | PRN
  Administered 2017-05-09: 650 mg via ORAL
  Filled 2017-05-05: qty 2

## 2017-05-05 MED ORDER — PRAVASTATIN SODIUM 40 MG PO TABS
40.0000 mg | ORAL_TABLET | Freq: Every day | ORAL | Status: DC
  Administered 2017-05-06 – 2017-05-11 (×6): 40 mg via ORAL
  Filled 2017-05-05 (×6): qty 1

## 2017-05-05 MED ORDER — ALBUTEROL SULFATE (2.5 MG/3ML) 0.083% IN NEBU
10.0000 mg | INHALATION_SOLUTION | Freq: Once | RESPIRATORY_TRACT | Status: AC
  Administered 2017-05-05: 10 mg via RESPIRATORY_TRACT
  Filled 2017-05-05: qty 12

## 2017-05-05 MED ORDER — PAROXETINE HCL 20 MG PO TABS
40.0000 mg | ORAL_TABLET | Freq: Every day | ORAL | Status: DC
  Administered 2017-05-06 – 2017-05-12 (×7): 40 mg via ORAL
  Filled 2017-05-05 (×7): qty 2

## 2017-05-05 MED ORDER — DEXTROSE 5 % IV SOLN
500.0000 mg | Freq: Once | INTRAVENOUS | Status: DC
  Filled 2017-05-05: qty 500

## 2017-05-05 MED ORDER — CLOPIDOGREL BISULFATE 75 MG PO TABS
75.0000 mg | ORAL_TABLET | Freq: Every day | ORAL | Status: DC
  Administered 2017-05-06 – 2017-05-12 (×7): 75 mg via ORAL
  Filled 2017-05-05 (×7): qty 1

## 2017-05-05 MED ORDER — CEFEPIME HCL 1 G IJ SOLR
500.0000 mg | INTRAMUSCULAR | Status: DC
  Administered 2017-05-06: 500 mg via INTRAVENOUS
  Filled 2017-05-05: qty 0.5

## 2017-05-05 MED ORDER — VANCOMYCIN HCL 10 G IV SOLR
1500.0000 mg | Freq: Once | INTRAVENOUS | Status: AC
  Administered 2017-05-06: 1500 mg via INTRAVENOUS
  Filled 2017-05-05: qty 1500

## 2017-05-05 MED ORDER — ONDANSETRON HCL 4 MG/2ML IJ SOLN: 4.0000 mg | Freq: Four times a day (QID) | INTRAMUSCULAR | Status: DC | PRN

## 2017-05-05 MED ORDER — SODIUM CHLORIDE 0.9 % IV SOLN: 250.0000 mL | INTRAVENOUS | Status: DC | PRN

## 2017-05-05 MED ORDER — INSULIN ASPART 100 UNIT/ML IV SOLN
10.0000 [IU] | Freq: Once | INTRAVENOUS | Status: AC
  Administered 2017-05-05: 10 [IU] via INTRAVENOUS
  Filled 2017-05-05: qty 0.1

## 2017-05-05 MED ORDER — AMLODIPINE BESYLATE 10 MG PO TABS
10.0000 mg | ORAL_TABLET | Freq: Every day | ORAL | Status: DC
  Administered 2017-05-06 – 2017-05-12 (×4): 10 mg via ORAL
  Filled 2017-05-05 (×2): qty 1
  Filled 2017-05-05: qty 2
  Filled 2017-05-05 (×5): qty 1

## 2017-05-05 MED ORDER — DEXTROSE 5 % IV SOLN: 2.0000 g | INTRAVENOUS | Status: DC

## 2017-05-05 MED ORDER — CLONIDINE HCL 0.2 MG PO TABS
0.2000 mg | ORAL_TABLET | Freq: Two times a day (BID) | ORAL | Status: DC
  Administered 2017-05-06 – 2017-05-12 (×11): 0.2 mg via ORAL
  Filled 2017-05-05 (×14): qty 1

## 2017-05-05 MED ORDER — DEXTROSE 50 % IV SOLN
1.0000 | Freq: Once | INTRAVENOUS | Status: AC
  Administered 2017-05-05: 50 mL via INTRAVENOUS
  Filled 2017-05-05: qty 50

## 2017-05-05 MED ORDER — VITAMIN D 1000 UNITS PO TABS
5000.0000 [IU] | ORAL_TABLET | Freq: Every day | ORAL | Status: DC
  Administered 2017-05-06 – 2017-05-12 (×7): 5000 [IU] via ORAL
  Filled 2017-05-05 (×7): qty 5

## 2017-05-05 MED ORDER — HEPARIN SODIUM (PORCINE) 5000 UNIT/ML IJ SOLN
5000.0000 [IU] | Freq: Three times a day (TID) | INTRAMUSCULAR | Status: DC
  Administered 2017-05-05 – 2017-05-12 (×18): 5000 [IU] via SUBCUTANEOUS
  Filled 2017-05-05 (×19): qty 1

## 2017-05-05 MED ORDER — SODIUM CHLORIDE 0.9% FLUSH
3.0000 mL | Freq: Two times a day (BID) | INTRAVENOUS | Status: DC
  Administered 2017-05-06 – 2017-05-11 (×12): 3 mL via INTRAVENOUS

## 2017-05-05 MED ORDER — AZITHROMYCIN 500 MG IV SOLR: 250.0000 mg | Freq: Once | INTRAVENOUS | Status: DC

## 2017-05-05 MED ORDER — FUROSEMIDE 10 MG/ML IJ SOLN
40.0000 mg | Freq: Two times a day (BID) | INTRAMUSCULAR | Status: DC
  Administered 2017-05-05 – 2017-05-06 (×2): 40 mg via INTRAVENOUS
  Filled 2017-05-05 (×2): qty 4

## 2017-05-05 MED ORDER — SODIUM BICARBONATE 650 MG PO TABS
650.0000 mg | ORAL_TABLET | Freq: Three times a day (TID) | ORAL | Status: DC
  Administered 2017-05-06 (×3): 650 mg via ORAL
  Filled 2017-05-05 (×4): qty 1

## 2017-05-05 MED ORDER — ATENOLOL 50 MG PO TABS
25.0000 mg | ORAL_TABLET | Freq: Every day | ORAL | Status: DC
  Administered 2017-05-06 – 2017-05-12 (×4): 25 mg via ORAL
  Filled 2017-05-05 (×8): qty 1

## 2017-05-05 MED ORDER — SODIUM CHLORIDE 0.9% FLUSH: 3.0000 mL | INTRAVENOUS | Status: DC | PRN

## 2017-05-05 MED ORDER — DEXTROSE 5 % IV SOLN
1.0000 g | Freq: Once | INTRAVENOUS | Status: DC
  Filled 2017-05-05: qty 10

## 2017-05-05 MED ORDER — FUROSEMIDE 10 MG/ML IJ SOLN
40.0000 mg | Freq: Once | INTRAMUSCULAR | Status: AC
  Administered 2017-05-05: 40 mg via INTRAVENOUS
  Filled 2017-05-05: qty 4

## 2017-05-05 MED ORDER — SODIUM BICARBONATE 8.4 % IV SOLN
50.0000 meq | Freq: Once | INTRAVENOUS | Status: AC
  Administered 2017-05-05: 50 meq via INTRAVENOUS
  Filled 2017-05-05: qty 50

## 2017-05-05 MED ORDER — CALCIUM GLUCONATE 10 % IV SOLN
1.0000 g | Freq: Once | INTRAVENOUS | Status: AC
  Administered 2017-05-05: 1 g via INTRAVENOUS
  Filled 2017-05-05: qty 10

## 2017-05-05 NOTE — ED Triage Notes (Addendum)
PT  Presents to ED with sudden onset SOB, speaking in broken up sentences in triage.  Pt states she started having a cough this morning.  No other associated symptoms including pain.  Pt c/o light-headedness earlier, but not now.  Patient has new fistula to left arm, supposed to start dialysis in the next week or two.

## 2017-05-05 NOTE — ED Notes (Signed)
No addl blood draw,  Pt moved to inpatient floor. 

## 2017-05-05 NOTE — ED Notes (Signed)
Pt returned from xray

## 2017-05-05 NOTE — Progress Notes (Signed)
Pharmacy Antibiotic Note  [name] is a 72 y.o. female admitted on 05/05/2017 with pneumonia.  Pharmacy has been consulted for vancomycin dosing.  Currently on azithromycin, cefepime and vancomycin for pneumonia. Renal function is elevated (Scr 6.61; CrCl 8 mL/min - NOT on HD). WBC is within normal level. Adjusted cefepime for renal function.  Plan: Changed cefepime 500 mg IV every 24 hours  Order vancomycin 1500 mg IV once  Monitor vancomycin level as indicated Monitor renal function, cx results, clinical picture   Height: 5\' 5"  (165.1 cm) Weight: 162 lb (73.5 kg) IBW/kg (Calculated) : 57  Temp (24hrs), Avg:97.8 F (36.6 C), Min:97.8 F (36.6 C), Max:97.8 F (36.6 C)  Recent Labs  Lab 05/05/17 1813  WBC 9.2  CREATININE 6.61*    Estimated Creatinine Clearance: 7.8 mL/min (A) (by C-G formula based on SCr of 6.61 mg/dL (H)).    Allergies  Allergen Reactions  . Lisinopril Other (See Comments)    Abnormal Kidney Function   . Pioglitazone Other (See Comments)    REACTION: Desquamation of skin of the palm  . Morphine And Related Nausea And Vomiting  . Sulfonamide Derivatives Rash    Antimicrobials this admission: Vancomycin 1/12 >>  Cefepime 1/12 >>  Azithromycin 1/12>>  Dose adjustments this admission: N/A  Microbiology results: 1/12 Sputum: sent   Thank you for allowing pharmacy to be a part of this patient's care.  Doylene Canard, PharmD Clinical Pharmacist  Pager: [phone] Phone: [phone] 05/05/2017 10:12 PM

## 2017-05-05 NOTE — H&P (Signed)
History and Physical    MARICA TRENTHAM NAT:[id number] DOB: Oct 14, 1945 DOA: 05/05/2017  PCP: [name], NP   Patient coming from: Home  Chief Complaint: Shortness of breath  HPI: [name] is a 72 y.o. 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  Patient reports that she had been in her usual state of health until this morning when she noticed dyspnea with exertion.  This worsened over the course of the day and became severe by the afternoon.  She now reports dyspnea with minimal exertion, improved with rest but only after 30 minutes or so.  She denies any recent fevers or chills, reports a nonproductive cough, denies chest pain or palpitations, and denies travel or sick contacts.  ED Course: Upon arrival to the ED, patient is found to be afebrile, saturating mid 80s on room air, tachypneic, hypertensive to 170/120, and with normal heart rate.  EKG features a normal sinus rhythm and chest x-ray is notable for mild interstitial edema, cardiomegaly, and patchy consolidation in the bilateral bases with pneumonia not excluded.  Chemistry panel reveals a potassium of 6.0, bicarbonate 17, BUN 64, and creatinine of 6.61, similar to priors.  CBC features a stable chronic normocytic anemia with hemoglobin of 8.6.  Troponin is within normal limits.  Patient was treated with 40 mg IV Lasix, 30 g of Kayexalate, Rocephin, and azithromycin in the ED.  She remains hemodynamically stable, dyspneic, but not in acute distress, and will be admitted to the stepdown unit for ongoing evaluation and management of hyperkalemia and hypervolemia, suspected secondary to chronic kidney disease.  Review of Systems:  All other systems reviewed and apart from HPI, are negative.  Past Medical History:  Diagnosis Date  .  Anemia   . Anxiety   . Arthritis    lower back and knees   . Asthma    childhood  . Barrett's esophagus   . Chronic kidney disease   . Depression   . Diabetes mellitus   . Dysphagia   . Eczema   . History of herpes zoster 02/2010   Recovered fully after period of acute herpetic neuralgia tx w/ gabapentin.   Marland Kitchen Hx MRSA infection 2009  . Hyperlipidemia   . Hypertension   . Neuromuscular disorder (HCC)    chronic pain  . Personal history of colonic polyps 10/17/2010   hyperplastic    Past Surgical History:  Procedure Laterality Date  . ABDOMINAL HYSTERECTOMY     unclear when  . AV FISTULA PLACEMENT Left 02/13/2017   Procedure: ARTERIOVENOUS (AV) GORE-TEX STRETCH GRAFT INSERTION INTO LEFT ARM;  Surgeon: Elam Dutch, MD;  Location: [facility];  Service: Vascular;  Laterality: Left;  . CATARACT EXTRACTION Bilateral   . thigh surg     left side due to MRSA     reports that she has been smoking cigarettes.  She has a 40.00 pack-year smoking history. she has never used smokeless tobacco. She reports that she drinks about 1.2 oz of alcohol per week. She reports that she does not use drugs.  Allergies  Allergen Reactions  . Lisinopril Other (See Comments)    Abnormal Kidney Function   . Pioglitazone Other (See Comments)    REACTION: Desquamation of skin of the palm  . Morphine And Related Nausea And Vomiting  . Sulfonamide Derivatives Rash  Family History  Problem Relation Age of Onset  . Hypertension Father   . Kidney disease Father   . Diabetes Brother   . Colon cancer Neg Hx   . CAD Neg Hx      Prior to Admission medications   Medication Sig Start Date End Date Taking? Authorizing Provider  amLODipine (NORVASC) 10 MG tablet Take 1 tablet (10 mg total) by mouth daily. 01/02/17  Yes [name], NP  aspirin EC 325 MG tablet Take 1 tablet (325 mg total) by mouth daily. 01/02/17  Yes [name], NP  atenolol (TENORMIN) 25 MG tablet Take 1 tablet (25 mg  total) by mouth daily. 01/02/17  Yes [name], NP  Cholecalciferol (VITAMIN D3) 5000 units TABS Take 1 tablet (5,000 Units total) by mouth daily. 01/02/17  Yes [name], NP  cloNIDine (CATAPRES) 0.2 MG tablet Take 1 tablet (0.2 mg total) by mouth 2 (two) times daily. 01/02/17  Yes [name], NP  clopidogrel (PLAVIX) 75 MG tablet Take 1 tablet (75 mg total) by mouth daily. 01/02/17  Yes [name], NP  ezetimibe (ZETIA) 10 MG tablet Take 1 tablet (10 mg total) by mouth daily. 01/02/17 01/02/18 Yes [name], NP  furosemide (LASIX) 40 MG tablet Take 40 mg by mouth 2 (two) times daily. 05/03/17  Yes [provider]  latanoprost (XALATAN) 0.005 % ophthalmic solution Place 1 drop into both eyes at bedtime. Reported on 08/27/2015 04/23/12  Yes [provider]  PARoxetine (PAXIL) 40 MG tablet Take 1 tablet (40 mg total) by mouth daily. 01/02/17  Yes [name], NP  pravastatin (PRAVACHOL) 40 MG tablet Take 1 tablet (40 mg total) by mouth at bedtime. 02/19/17  Yes [name], NP  SUMAtriptan (IMITREX) 25 MG tablet 1 tablet daily as needed for headache, May repeat in 2 hours if headache persists or recurs. Max of 2 tablets in 24 hours Patient taking differently: Take 25 mg by mouth See admin instructions. Take one tablet (25 mg) by mouth as needed for headache, May repeat in 2 hours if headache persists or recurs. Max of 2 tablets in 24 hours 01/02/17  Yes Eubanks, Carlos American, NP  triamcinolone cream (KENALOG) 0.1 % Apply 1 application topically 3 (three) times daily as needed (rash).  04/14/15  Yes [provider]  ALPRAZolam Duanne Moron) 0.5 MG tablet Take one tablet by mouth twice daily as needed for anxiety Patient not taking: Reported on 05/05/2017 03/06/17   [name], NP  Melatonin 3 MG TABS 6 mg by mouth as needed for sleep. Patient not taking: Reported on 05/05/2017 01/02/17   [name], NP  omeprazole (PRILOSEC)  40 MG capsule Take 1 capsule (40 mg total) by mouth daily. Patient not taking: Reported on 05/05/2017 04/04/17   [name], NP  sodium bicarbonate 650 MG tablet Take 1 tablet (650 mg total) by mouth 3 (three) times daily. Patient not taking: Reported on 05/05/2017 03/16/17   Alma Friendly, MD    Physical Exam: Vitals:   05/05/17 1801 05/05/17 1803 05/05/17 1915 05/05/17 1930  BP: (!) 168/121  (!) 148/100 (!) 160/75  Pulse: 70   61  Resp:   (!) 26 (!) 21  Temp: 97.8 F (36.6 C)     TempSrc: Oral     SpO2: 97% (!) 86%  100%      Constitutional:  Not in acute distress, tachypneic  Eyes: PERTLA, lids and conjunctivae  normal ENMT: Mucous membranes are moist. Posterior pharynx clear of any exudate or lesions.   Neck: normal, supple, no masses, no thyromegaly Respiratory: Rales bilaterally. Tachypnea, dyspnea with speech. No accessory muscle use.  Cardiovascular: S1 & S2 heard, regular rate and rhythm, no significant murmurs / rubs / gallops. 2+ pretibial edema bilaterally. Abdomen: No distension, no tenderness, no masses palpated. Bowel sounds normal.  Musculoskeletal: no clubbing / cyanosis. No joint deformity upper and lower extremities.  Skin: no significant rashes, lesions, ulcers. Warm, dry, well-perfused. Neurologic: CN 2-12 grossly intact. Sensation intact. Strength 5/5 in all 4 limbs.  Psychiatric: lert and oriented x 3. Calm, cooperative.     Labs on Admission: I have personally reviewed following labs and imaging studies  CBC: Recent Labs  Lab 05/05/17 1813  WBC 9.2  HGB 8.6*  HCT 26.0*  MCV 88.7  PLT 779   Basic Metabolic Panel: Recent Labs  Lab 05/05/17 1813  NA 139  K 6.0*  CL 112*  CO2 17*  GLUCOSE 102*  BUN 64*  CREATININE 6.61*  CALCIUM 8.4*   GFR: CrCl cannot be calculated (Unknown ideal weight.). Liver Function Tests: No results for input(s): AST, ALT, ALKPHOS, BILITOT, PROT, ALBUMIN in the last 168 hours. No results for  input(s): LIPASE, AMYLASE in the last 168 hours. No results for input(s): AMMONIA in the last 168 hours. Coagulation Profile: No results for input(s): INR, PROTIME in the last 168 hours. Cardiac Enzymes: No results for input(s): CKTOTAL, CKMB, CKMBINDEX, TROPONINI in the last 168 hours. BNP (last 3 results) No results for input(s): PROBNP in the last 8760 hours. HbA1C: No results for input(s): HGBA1C in the last 72 hours. CBG: No results for input(s): GLUCAP in the last 168 hours. Lipid Profile: No results for input(s): CHOL, HDL, LDLCALC, TRIG, CHOLHDL, LDLDIRECT in the last 72 hours. Thyroid Function Tests: No results for input(s): TSH, T4TOTAL, FREET4, T3FREE, THYROIDAB in the last 72 hours. Anemia Panel: No results for input(s): VITAMINB12, FOLATE, FERRITIN, TIBC, IRON, RETICCTPCT in the last 72 hours. Urine analysis:    Component Value Date/Time   COLORURINE YELLOW 11/21/2012 1438   APPEARANCEUR CLEAR 11/21/2012 1438   LABSPEC 1.027 11/21/2012 1438   PHURINE 5.0 11/21/2012 1438   GLUCOSEU NEG 11/21/2012 1438   HGBUR NEG 11/21/2012 1438   BILIRUBINUR SMALL (A) 11/21/2012 1438   KETONESUR TRACE (A) 11/21/2012 1438   PROTEINUR > 300 (A) 11/21/2012 1438   UROBILINOGEN 0.2 11/21/2012 1438   NITRITE NEG 11/21/2012 1438   LEUKOCYTESUR MOD (A) 11/21/2012 1438   Sepsis Labs: @LABRCNTIP (procalcitonin:4,lacticidven:4) )No results found for this or any previous visit (from the past 240 hour(s)).   Radiological Exams on Admission: Dg Chest 2 View  Result Date: 05/05/2017 CLINICAL DATA:  Shortness of breath and cough this morning EXAM: CHEST  2 VIEW COMPARISON:  March 14, 2017 FINDINGS: The heart size and mediastinal contours are stable. The heart size is enlarged. Patchy consolidation of bilateral lung bases are noted. There is a small right pleural effusion. There is mild interstitial edema. The visualized skeletal structures are stable. IMPRESSION: Patchy consolidation of  bilateral lung bases pneumonia is not excluded. Small right pleural effusion. Mild interstitial edema.  Cardiomegaly. Electronically Signed   By: Abelardo Diesel M.D.   On: 05/05/2017 19:48    EKG: Independently reviewed. Normal sinus rhythm.   Assessment/Plan  1. CKD stage V; hyperkalemia; metabolic acidosis  - Follows with nephrology and has fistula in LUE, but not yet started on HD  -  Presents with SOB secondary to hypervolemia, found to have mild acidosis, serum potassium of 6.0, and hypertensive urgency  - She reports fistula to be several months old  - Continue diuresis as discussed below  - Treat hyperkalemia with albuterol, insulin/dextrose, calcium, Lasix, bicarbonate - Continue cardiac monitoring, serial potassium levels  - If fails to clear potassium and volume with this plan, will likely need to start HD    2. Acute pulmonary edema; acute hypoxic respiratory failure   - Presents with SOB, becoming severe over the course of the day  - Saturating mid-80's on room air on arrival, develops distress with minimal exertion  - No fever, leukocytosis, or productive cough; CXR with pulm edema and bibasilar opacities  - She is prescribed Lasix 40 mg BID, but not sure if she is taking  - Given 40 mg IV Lasix in ED  - SLIV, fluid-restrict diet, follow daily wts and I/O's, continue diuresis with Lasix 40 mg IV q12h  3. ?HCAP - CXR with bibasilar opacities, pneumonia not excluded  - Suspect this reflects edema given absence of fever, leukocytosis, or productive cough  - Check sputum culture, strep pneumo antigen, and procalcitonin  - Given the patient's critical illness, will continue abx for now while following above studies and clinical course   4. Hypertension with hypertensive urgency  - DBP in 100's in ED  - Likely secondary to hypervolemia  - Continue diuresis, continue clonidine, Norvasc, atenolol   5. Anemia  - Hgb is 8.6 on admission  - This is stable relative to priors and  likely secondary to CKD  - No transfusion requirements anticipated this admission     DVT prophylaxis: sq heparin  Code Status: Full  Family Communication: Discussed with patient Disposition Plan: Admit to SDU  Consults called: None Admission status: Inpatient    Vianne Bulls, MD [facility] Pager [phone]  If 7PM-7AM, please contact night-coverage [email or web address] Password [facility]  05/05/2017, 8:34 PM

## 2017-05-05 NOTE — ED Provider Notes (Signed)
[facility] EMERGENCY DEPARTMENT Provider Note   CSN: [id number] Arrival date & time: 05/05/17  1753     History   Chief Complaint Chief Complaint  Patient presents with  . Shortness of Breath    HPI [name] is a 72 y.o. female.  HPI [name] is a 72 y.o. female with history of anemia, anxiety, diabetes, hypertension, end-stage kidney disease, presents to emergency department complaining of shortness of breath.  Patient states she was taking a nap, and woke up around 4 PM feeling short of breath.  She states that she felt slightly dizzy prior to her nap but did not have any chest pain or shortness of breath.  She denies any chest pain at this time.  She denies any cough or congestion.  She has not had any recent fever or illnesses.  She states she had similar episodes in the past like this and states she had fluid on her lungs.  She is currently seeing a nephrologist for end-stage renal disease and states that the plan is to start her on dialysis sometimes very soon.  She also states that she feels like her legs are more swollen than usual.  Past Medical History:  Diagnosis Date  . Anemia   . Anxiety   . Arthritis    lower back and knees   . Asthma    childhood  . Barrett's esophagus   . Chronic kidney disease   . Depression   . Diabetes mellitus   . Dysphagia   . Eczema   . History of herpes zoster 02/2010   Recovered fully after period of acute herpetic neuralgia tx w/ gabapentin.   Marland Kitchen Hx MRSA infection 2009  . Hyperlipidemia   . Hypertension   . Neuromuscular disorder (HCC)    chronic pain  . Personal history of colonic polyps 10/17/2010   hyperplastic    Patient Active Problem List   Diagnosis Date Noted  . Hypoxia   . Pneumonia 03/10/2017  . CKD (chronic kidney disease) stage 5, GFR less than 15 ml/min (HCC) 12/03/2016  . CVA (cerebral vascular accident) ([place]) 12/03/2016  . Stroke (cerebrum) ([place]) 12/02/2016  . Memory changes  05/29/2016  . Stroke syndrome 05/29/2016  . Mild single current episode of major depressive disorder ([place]) 05/29/2016  . Osteoarthritis of right knee 02/14/2016  . Current smoker 01/06/2016  . Headache 06/24/2015  . Migraine 06/24/2015  . Chronic knee pain 10/13/2014  . Eczema   . Arthritis   . Anxiety   . Iron deficiency anemia due to chronic blood loss 09/05/2013  . Microalbuminuria 09/05/2013  . Toe pain, left 08/05/2013  . Lipoma of back 07/31/2013  . Tobacco abuse 10/18/2012  . Dermatitis 04/30/2012  . Barrett esophagus 11/02/2010  . GERD (gastroesophageal reflux disease) with acute nausea  09/29/2010  . 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 HERPES ZOSTER 03/25/2010  . Hyperlipidemia 10/30/2008  . Depression 07/16/2008  . INSOMNIA UNSPECIFIED 12/19/2007  . Essential hypertension 11/04/2007    Past Surgical History:  Procedure Laterality Date  . ABDOMINAL HYSTERECTOMY     unclear when  . AV FISTULA PLACEMENT Left 02/13/2017   Procedure: ARTERIOVENOUS (AV) GORE-TEX STRETCH GRAFT INSERTION INTO LEFT ARM;  Surgeon: Elam Dutch, MD;  Location: [facility];  Service: Vascular;  Laterality: Left;  . CATARACT EXTRACTION Bilateral   . thigh surg     left side due to MRSA    OB History  No data available       Home Medications    Prior to Admission medications   Medication Sig Start Date End Date Taking? Authorizing Provider  ALPRAZolam Duanne Moron) 0.5 MG tablet Take one tablet by mouth twice daily as needed for anxiety 03/06/17   [name], NP  amLODipine (NORVASC) 10 MG tablet Take 1 tablet (10 mg total) by mouth daily. 01/02/17   [name], NP  aspirin EC 325 MG tablet Take 1 tablet (325 mg total) by mouth daily. 01/02/17   [name], NP  atenolol (TENORMIN) 25 MG tablet Take 1 tablet (25 mg total) by mouth daily. 01/02/17   [name], NP  Cholecalciferol (VITAMIN D3) 5000 units TABS  Take 1 tablet (5,000 Units total) by mouth daily. 01/02/17   [name], NP  cloNIDine (CATAPRES) 0.2 MG tablet Take 1 tablet (0.2 mg total) by mouth 2 (two) times daily. 01/02/17   [name], NP  clopidogrel (PLAVIX) 75 MG tablet Take 1 tablet (75 mg total) by mouth daily. 01/02/17   [name], NP  ezetimibe (ZETIA) 10 MG tablet Take 1 tablet (10 mg total) by mouth daily. 01/02/17 01/02/18  [name], NP  latanoprost (XALATAN) 0.005 % ophthalmic solution Place 1 drop into both eyes at bedtime. Reported on 08/27/2015 04/23/12   [provider]  Melatonin 3 MG TABS 6 mg by mouth as needed for sleep. 01/02/17   [name], NP  omeprazole (PRILOSEC) 40 MG capsule Take 1 capsule (40 mg total) by mouth daily. 04/04/17   [name], NP  oxyCODONE-acetaminophen (ROXICET) 5-325 MG tablet Take 1 tablet by mouth every 6 (six) hours as needed for severe pain. 02/13/17   Rhyne, Hulen Shouts, PA-C  PARoxetine (PAXIL) 40 MG tablet Take 1 tablet (40 mg total) by mouth daily. 01/02/17   [name], NP  pravastatin (PRAVACHOL) 40 MG tablet Take 1 tablet (40 mg total) by mouth at bedtime. 02/19/17   [name], NP  predniSONE (STERAPRED UNI-PAK 21 TAB) 10 MG (21) TBPK tablet Use as directed 04/10/17   [name], NP  sodium bicarbonate 650 MG tablet Take 1 tablet (650 mg total) by mouth 3 (three) times daily. 03/16/17   Alma Friendly, MD  SUMAtriptan (IMITREX) 25 MG tablet 1 tablet daily as needed for headache, May repeat in 2 hours if headache persists or recurs. Max of 2 tablets in 24 hours 01/02/17   [name], NP  triamcinolone cream (KENALOG) 0.1 % Apply 1 application topically 3 (three) times daily as needed (rash).  04/14/15   [provider]    Family History Family History  Problem Relation Age of Onset  . Hypertension Father   . Kidney disease Father   . Diabetes Brother   . Colon cancer Neg Hx   . CAD Neg  Hx     Social History Social History   Tobacco Use  . Smoking status: Current Every Day Smoker    Packs/day: 1.00    Years: 40.00    Pack years: 40.00    Types: Cigarettes  . Smokeless tobacco: Never Used  . Tobacco comment: smokes 1 pack of cigerettes a week- off and on  Substance Use Topics  . Alcohol use: Yes    Alcohol/week: 1.2 oz    Types: 2 Standard drinks or equivalent per week    Comment: wine and beer- occasionally   . Drug use: No  Allergies   Lisinopril; Pioglitazone; Morphine and related; and Sulfonamide derivatives   Review of Systems Review of Systems  Constitutional: Negative for chills and fever.  Respiratory: Positive for shortness of breath. Negative for cough and chest tightness.   Cardiovascular: Positive for leg swelling. Negative for chest pain and palpitations.  Gastrointestinal: Negative for abdominal pain, diarrhea, nausea and vomiting.  Genitourinary: Negative for dysuria, flank pain, pelvic pain, vaginal bleeding, vaginal discharge and vaginal pain.  Musculoskeletal: Negative for arthralgias, myalgias, neck pain and neck stiffness.  Skin: Negative for rash.  Neurological: Positive for weakness. Negative for dizziness and headaches.  All other systems reviewed and are negative.    Physical Exam Updated Vital Signs BP (!) 148/100   Pulse 70   Temp 97.8 F (36.6 C) (Oral)   Resp (!) 26   SpO2 (!) 86%   Physical Exam  Constitutional: She appears well-developed and well-nourished. No distress.  HENT:  Head: Normocephalic.  Eyes: Conjunctivae are normal.  Neck: Neck supple.  Cardiovascular: Normal rate, regular rhythm and normal heart sounds.  Pulmonary/Chest: Effort normal. No respiratory distress.  Decreased air movement bilaterally, Rales at bases  Abdominal: Soft. Bowel sounds are normal. She exhibits no distension. There is no tenderness. There is no rebound.  Musculoskeletal: She exhibits no edema.  3+ pitting edema  bilaterally up to the knees  Neurological: She is alert.  Skin: Skin is warm and dry.  Psychiatric: She has a normal mood and affect. Her behavior is normal.  Nursing note and vitals reviewed.    ED Treatments / Results  Labs (all labs ordered are listed, but only abnormal results are displayed) Labs Reviewed  BASIC METABOLIC PANEL - Abnormal; Notable for the following components:      Result Value   Potassium 6.0 (*)    Chloride 112 (*)    CO2 17 (*)    Glucose, Bld 102 (*)    BUN 64 (*)    Creatinine, Ser 6.61 (*)    Calcium 8.4 (*)    GFR calc non Af Amer 6 (*)    GFR calc Af Amer 7 (*)    All other components within normal limits  CBC - Abnormal; Notable for the following components:   RBC 2.93 (*)    Hemoglobin 8.6 (*)    HCT 26.0 (*)    RDW 16.3 (*)    All other components within normal limits  POTASSIUM  I-STAT TROPONIN, ED    EKG  EKG Interpretation  Date/Time:  Saturday May 05 2017 17:58:26 EST Ventricular Rate:  72 PR Interval:  160 QRS Duration: 82 QT Interval:  404 QTC Calculation: 442 R Axis:   -28 Text Interpretation:  Normal sinus rhythm Normal ECG No significant change since last tracing Confirmed by Gareth Morgan [phone]) on 05/05/2017 7:02:53 PM       Radiology No results found.  Procedures Procedures (including critical care time)  Medications Ordered in ED Medications  sodium polystyrene (KAYEXALATE) 15 GM/60ML suspension 30 g (not administered)  furosemide (LASIX) injection 40 mg (not administered)     Initial Impression / Assessment and Plan / ED Course  I have reviewed the triage vital signs and the nursing notes.  Pertinent labs & imaging results that were available during my care of the patient were reviewed by me and considered in my medical decision making (see chart for details).     Patient with end-stage renal disease, predialysis, have not had dialysis yet, here with shortness of breath  which started after her  waking up from her nap.  She states she slept on 2 pillows.  She denies any cough or congestion.  No fever.  No chest pain.  Decreased air movement bilaterally on her exam with some rales at bases.  Will check labs, EKG, chest x-ray.  7:42 PM Potassium is 6.0 today, creatinine is 6.61, appears to be close to her baseline.  Will treat hyperkalemia with Kayexalate and Lasix.  Her EKG does not show any peaked T waves or any other acute changes.  Chest x-ray still pending.  Patient's vital signs appear to be stable while at rest  Spoke with triad, will admit.    Vitals:   05/05/17 2202 05/05/17 2202 05/05/17 2352 05/06/17 0000  BP:    (!) 186/83  Pulse: 63   66  Resp: (!) 22   (!) 21  Temp:    97.7 F (36.5 C)  TempSrc:    Oral  SpO2: 99%  100% 100%  Weight:  73.5 kg (162 lb)  78.4 kg (172 lb 13.5 oz)  Height:  5\' 5"  (1.651 m)  5\' 5"  (1.651 m)     Final Clinical Impressions(s) / ED Diagnoses   Final diagnoses:  Shortness of breath  Community acquired pneumonia, unspecified laterality  Hyperkalemia    ED Discharge Orders    None       Jeannett Senior, PA-C 05/06/17 0118    Gareth Morgan, MD 05/06/17 1411

## 2017-05-06 ENCOUNTER — Other Ambulatory Visit: Payer: Self-pay

## 2017-05-06 LAB — STREP PNEUMONIAE URINARY ANTIGEN: STREP PNEUMO URINARY ANTIGEN: NEGATIVE

## 2017-05-06 LAB — PROCALCITONIN: Procalcitonin: <0.1 ng/mL

## 2017-05-06 LAB — MRSA PCR SCREENING: MRSA by PCR: NEGATIVE

## 2017-05-06 LAB — IRON AND TIBC
Iron: 32 ug/dL (ref 28–170)
SATURATION RATIOS: 13 % (ref 10.4–31.8)
TIBC: 242 ug/dL — ABNORMAL LOW (ref 250–450)
UIBC: 210 ug/dL

## 2017-05-06 LAB — BASIC METABOLIC PANEL
ANION GAP: 10 (ref 5–15)
BUN: 63 mg/dL — ABNORMAL HIGH (ref 6–20)
CHLORIDE: 114 mmol/L — AB (ref 101–111)
CO2: 21 mmol/L — ABNORMAL LOW (ref 22–32)
CREATININE: 6.81 mg/dL — AB (ref 0.44–1.00)
Calcium: 8.7 mg/dL — ABNORMAL LOW (ref 8.9–10.3)
GFR calc non Af Amer: 5 mL/min — ABNORMAL LOW (ref >60)
GFR, EST AFRICAN AMERICAN: 6 mL/min — AB (ref >60)
Glucose, Bld: 93 mg/dL (ref 65–99)
POTASSIUM: 5.1 mmol/L (ref 3.5–5.1)
SODIUM: 145 mmol/L (ref 135–145)

## 2017-05-06 LAB — POTASSIUM
POTASSIUM: 5 mmol/L (ref 3.5–5.1)
Potassium: 5 mmol/L (ref 3.5–5.1)

## 2017-05-06 LAB — PHOSPHORUS: PHOSPHORUS: 5.3 mg/dL — AB (ref 2.5–4.6)

## 2017-05-06 LAB — FERRITIN: Ferritin: 232 ng/mL (ref 11–307)

## 2017-05-06 MED ORDER — HYDRALAZINE HCL 20 MG/ML IJ SOLN
10.0000 mg | Freq: Four times a day (QID) | INTRAMUSCULAR | Status: DC | PRN
  Administered 2017-05-10 – 2017-05-11 (×3): 10 mg via INTRAVENOUS
  Filled 2017-05-06 (×4): qty 1

## 2017-05-06 MED ORDER — DEXTROSE 5 % IV SOLN
120.0000 mg | Freq: Three times a day (TID) | INTRAVENOUS | Status: AC
  Administered 2017-05-06: 120 mg via INTRAVENOUS
  Filled 2017-05-06: qty 12
  Filled 2017-05-06: qty 10
  Filled 2017-05-06: qty 12

## 2017-05-06 NOTE — Consult Note (Signed)
Renal Service Consult Note [facility]  [name] 05/06/2017 [name] Requesting Physician:  Dr Tawanna Solo  Reason for Consult:  Renal failure HPI: The patient is a 72 y.o. year-old with SOB, cough, having trouble finishing sentences due to dyspnea. Has an AVG in her arm placed in Dec 2018 and was admitted here in November 2018 and refused to start dialysis then in spite of vol overload and marked azotemia.  Asked to see for the same problems.    Pt says she came to hospital because she had "a spell where I couldn't breathe". Coughing , no CP or fevers, no n/v/d or abd pain.  Has AVG in her arm, no problems w/ that.    ROS  denies CP  no joint pain   no HA  no blurry vision  no rash  no diarrhea  no nausea/ vomiting    Past Medical History  Past Medical History:  Diagnosis Date  . Anemia   . Anxiety   . Arthritis    lower back and knees   . Asthma    childhood  . Barrett's esophagus   . Chronic kidney disease   . Depression   . Diabetes mellitus   . Dysphagia   . Eczema   . History of herpes zoster 02/2010   Recovered fully after period of acute herpetic neuralgia tx w/ gabapentin.   Marland Kitchen Hx MRSA infection 2009  . Hyperlipidemia   . Hypertension   . Neuromuscular disorder (HCC)    chronic pain  . Personal history of colonic polyps 10/17/2010   hyperplastic   Past Surgical History  Past Surgical History:  Procedure Laterality Date  . ABDOMINAL HYSTERECTOMY     unclear when  . AV FISTULA PLACEMENT Left 02/13/2017   Procedure: ARTERIOVENOUS (AV) GORE-TEX STRETCH GRAFT INSERTION INTO LEFT ARM;  Surgeon: Elam Dutch, MD;  Location: [facility];  Service: Vascular;  Laterality: Left;  . CATARACT EXTRACTION Bilateral   . thigh surg     left side due to MRSA   Family History  Family History  Problem Relation Age of Onset  . Hypertension Father   . Kidney disease Father   . Diabetes Brother   . Colon cancer Neg Hx   . CAD Neg Hx     Social History  reports that she has been smoking cigarettes.  She has a 40.00 pack-year smoking history. she has never used smokeless tobacco. She reports that she drinks about 1.2 oz of alcohol per week. She reports that she does not use drugs. Allergies  Allergies  Allergen Reactions  . Lisinopril Other (See Comments)    Abnormal Kidney Function   . Pioglitazone Other (See Comments)    REACTION: Desquamation of skin of the palm  . Morphine And Related Nausea And Vomiting  . Sulfonamide Derivatives Rash   Home medications Prior to Admission medications   Medication Sig Start Date End Date Taking? Authorizing Provider  amLODipine (NORVASC) 10 MG tablet Take 1 tablet (10 mg total) by mouth daily. 01/02/17  Yes [name], NP  aspirin EC 325 MG tablet Take 1 tablet (325 mg total) by mouth daily. 01/02/17  Yes [name], NP  atenolol (TENORMIN) 25 MG tablet Take 1 tablet (25 mg total) by mouth daily. 01/02/17  Yes [name], NP  Cholecalciferol (VITAMIN D3) 5000 units TABS Take 1 tablet (5,000 Units total) by mouth daily. 01/02/17  Yes [name], NP  cloNIDine (CATAPRES)  0.2 MG tablet Take 1 tablet (0.2 mg total) by mouth 2 (two) times daily. 01/02/17  Yes [name], NP  clopidogrel (PLAVIX) 75 MG tablet Take 1 tablet (75 mg total) by mouth daily. 01/02/17  Yes [name], NP  ezetimibe (ZETIA) 10 MG tablet Take 1 tablet (10 mg total) by mouth daily. 01/02/17 01/02/18 Yes [name], NP  furosemide (LASIX) 40 MG tablet Take 40 mg by mouth 2 (two) times daily. 05/03/17  Yes [provider]  latanoprost (XALATAN) 0.005 % ophthalmic solution Place 1 drop into both eyes at bedtime. Reported on 08/27/2015 04/23/12  Yes [provider]  PARoxetine (PAXIL) 40 MG tablet Take 1 tablet (40 mg total) by mouth daily. 01/02/17  Yes [name], NP  pravastatin (PRAVACHOL) 40 MG tablet Take 1 tablet (40 mg total) by mouth at  bedtime. 02/19/17  Yes [name], NP  SUMAtriptan (IMITREX) 25 MG tablet 1 tablet daily as needed for headache, May repeat in 2 hours if headache persists or recurs. Max of 2 tablets in 24 hours Patient taking differently: Take 25 mg by mouth See admin instructions. Take one tablet (25 mg) by mouth as needed for headache, May repeat in 2 hours if headache persists or recurs. Max of 2 tablets in 24 hours 01/02/17  Yes Eubanks, Carlos American, NP  triamcinolone cream (KENALOG) 0.1 % Apply 1 application topically 3 (three) times daily as needed (rash).  04/14/15  Yes [provider]  ALPRAZolam Duanne Moron) 0.5 MG tablet Take one tablet by mouth twice daily as needed for anxiety Patient not taking: Reported on 05/05/2017 03/06/17   [name], NP  omeprazole (PRILOSEC) 40 MG capsule Take 1 capsule (40 mg total) by mouth daily. Patient not taking: Reported on 05/05/2017 04/04/17   [name], NP  sodium bicarbonate 650 MG tablet Take 1 tablet (650 mg total) by mouth 3 (three) times daily. Patient not taking: Reported on 05/05/2017 03/16/17   Alma Friendly, MD   Liver Function Tests No results for input(s): AST, ALT, ALKPHOS, BILITOT, PROT, ALBUMIN in the last 168 hours. No results for input(s): LIPASE, AMYLASE in the last 168 hours. CBC Recent Labs  Lab 05/05/17 1813  WBC 9.2  HGB 8.6*  HCT 26.0*  MCV 88.7  PLT 127   Basic Metabolic Panel Recent Labs  Lab 05/05/17 1813 05/05/17 1949 05/06/17 0137 05/06/17 0557 05/06/17 1126  NA 139  --   --  145  --   K 6.0* 7.5* 5.0 5.1 5.0  CL 112*  --   --  114*  --   CO2 17*  --   --  21*  --   GLUCOSE 102*  --   --  93  --   BUN 64*  --   --  63*  --   CREATININE 6.61*  --   --  6.81*  --   CALCIUM 8.4*  --   --  8.7*  --    Iron/TIBC/Ferritin/ %Sat    Component Value Date/Time   IRON 46 03/09/2017 0857   TIBC 200 (L) 03/09/2017 0857   FERRITIN 292 03/09/2017 0857   IRONPCTSAT 23 03/09/2017 0857   IRONPCTSAT  53 09/05/2013 1100    Vitals:   05/06/17 0425 05/06/17 0746 05/06/17 0913 05/06/17 1144  BP:  (!) 174/98 (!) 114/99   Pulse:  77 72   Resp:  (!) 30    Temp:  (!) 97.5 F (36.4  C)  98.4 F (36.9 C)  TempSrc:  Oral  Oral  SpO2:  97%    Weight: 78.4 kg (172 lb 13.5 oz)     Height:       Exam Gen mild ^wob, no distress No rash, cyanosis or gangrene Sclera anicteric, throat clear  +JVD Chest bilateral basilar rales 1/3 up, no wheezing RRR no MRG Abd soft ntnd no mass or ascites +bs GU defer MS no joint effusions or deformity Ext 1-2 bilat LE pitting edema, no wounds or ulcers Neuro is alert, Ox 3 , nf, no asterixis   Home meds: -norvasc 10/ atenolol 25 qd/ clonidine 0.2 bid/ lasix 40 bid -ecasa 325/ vit D/ plavix 75/ zetia 10 qd/ paxil / statin/ xanax prn/ PPI/ Na bicarb  Date  Creat  eGFR  CKD 2009- 10 0.6- 1.0 2011  1.0- 1.4 2012- 2016 1.2- 1.9 27- 44  III/ IV 2017  2.0- 2.9 16- 21  IV Feb 2018 3.51  13  V Jun 2018 6.33  6  V Aug 2018 7.9- 8.01 Mar 2017 6.4- 6.8 5-6  V Jan 12  6.6 May 06 2017 6.8  5  V  CXR 1/12 >   Impression: 1. Advanced renal failure - stage V with vol overload, plan is for initiation of dialysis w/ first HD tomorrow. She has an AVG which is ready to use.  Have d/w pt and questions answered.  2. Vol overload - pulm edema, as above. Doubt HCAP.  Will ^ IV lasix for tonight.  3. HTN - cont meds 4. 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, phos   Plan - as above  Kelly Splinter MD [employer] pager [phone]   05/06/2017, 5:15 PM

## 2017-05-06 NOTE — Progress Notes (Signed)
PROGRESS NOTE    [name]  ZOX:[id number] DOB: Apr 29, 1945 DOA: 05/05/2017 PCP: Sharon Seller, NP   Brief Narrative: Patient is a 58 old female with past medical history of CKD stage V with fistula but not on dialysis yet, anxiety, hypertension, chronic anemia who presents to the emergency department for the evaluation of shortness of breath.  Patient was found to have pulmonary edema secondary to volume overload.  She was also found to be hypertensive.  Chest x-ray showed mild interstitial edema, cardiomegaly, patchy consolidation in the bilateral bases with pneumonia not excluded.  The patient was treated with IV Lasix.    Assessment & Plan:   Principal Problem:   Pulmonary edema, acute (HCC) Active Problems:   Essential hypertension   Barrett esophagus   Anxiety   CKD (chronic kidney disease) stage 5, GFR less than 15 ml/min (HCC)   Acute respiratory failure with hypoxia (HCC)   Hyperkalemia   Anemia due to chronic kidney disease   Hypertensive urgency   HCAP (healthcare-associated pneumonia)  CKD stage V: Presented with hyperkalemia and metabolic acidosis. She follows with nephrology as an outpatient.  She has fistula on the left upper extremity.  Not started on dialysis yet.  AV fistula was put about 2 years ago.  She is not oliguric. Was presented with shortness of breath secondary to volume overload and hyperkalemia with hypertensive urgency. Continue diuresis. Nephrology has been consulted.  Hyperkalemia: Given Kayexalate.  Potassium level this morning was 5.1.  We will continue to monitor.  Acute pulmonary edema/hypoxic respiratory failure: Presented with shortness of breath.  No saturating normally on room air.  Respiratory status stable.. Continue diuresis with IV Lasix. Continue fluid restriction, daily output/input measuring  Possible HCAP: Chest x-ray showed bibasilar opacities, pneumonia not excluded.  Patient is afebrile, no leukocytosis, no cough.   Will discontinue antibiotics.  Hypertensive urgency: Currently blood pressure stabilizing.  We will continue diuresis.  We will continue clonidine, amlodipine, atenolol.  Anemia: Likely chronic.  Most likely started with chronic kidney disease.  We will continue to monitor.      DVT prophylaxis: [place] heparin Code Status: Full Family Communication: None presented at the bedside Disposition Plan: Home   Consultants: Nephrology  Procedures: None  Antimicrobials: Was on vancomycin and Cefepime . Stopped now  Subjective:   Objective: Vitals:   05/06/17 0425 05/06/17 0746 05/06/17 0913 05/06/17 1144  BP:  (!) 174/98 (!) 114/99   Pulse:  77 72   Resp:  (!) 30    Temp:  (!) 97.5 F (36.4 C)  98.4 F (36.9 C)  TempSrc:  Oral  Oral  SpO2:  97%    Weight: 78.4 kg (172 lb 13.5 oz)     Height:        Intake/Output Summary (Last 24 hours) at 05/06/2017 1205 Last data filed at 05/06/2017 1148 Gross per 24 hour  Intake 753 ml  Output 700 ml  Net 53 ml   Filed Weights   05/05/17 2202 05/06/17 0000 05/06/17 0425  Weight: 73.5 kg (162 lb) 78.4 kg (172 lb 13.5 oz) 78.4 kg (172 lb 13.5 oz)    Examination:  General exam: Appears calm and comfortable ,Not in distress,obese Respiratory system: Bilateral mildly decreased air entry, normal vesicular breath sounds, no wheezes or crackles  Cardiovascular system: S1 & S2 heard, RRR. No JVD, murmurs, rubs, gallops or clicks. No pedal edema. Gastrointestinal system: Abdomen is nondistended, soft and nontender. No organomegaly or masses felt. Normal bowel sounds  heard. Central nervous system: Alert and oriented. No focal neurological deficits. Extremities: No edema, no clubbing ,no cyanosis, distal peripheral pulses palpable,AV fistula on the left arm Skin: No cyanosis,No pallor,No Rash,No Ulcer Psychiatry: Judgement and insight appear normal. Mood & affect appropriate.     Data Reviewed: I have personally reviewed following labs and  imaging studies  CBC: Recent Labs  Lab 05/05/17 1813  WBC 9.2  HGB 8.6*  HCT 26.0*  MCV 88.7  PLT 168   Basic Metabolic Panel: Recent Labs  Lab 05/05/17 1813 05/05/17 1949 05/06/17 0137 05/06/17 0557  NA 139  --   --  145  K 6.0* 7.5* 5.0 5.1  CL 112*  --   --  114*  CO2 17*  --   --  21*  GLUCOSE 102*  --   --  93  BUN 64*  --   --  63*  CREATININE 6.61*  --   --  6.81*  CALCIUM 8.4*  --   --  8.7*   GFR: Estimated Creatinine Clearance: 7.8 mL/min (A) (by C-G formula based on SCr of 6.81 mg/dL (H)). Liver Function Tests: No results for input(s): AST, ALT, ALKPHOS, BILITOT, PROT, ALBUMIN in the last 168 hours. No results for input(s): LIPASE, AMYLASE in the last 168 hours. No results for input(s): AMMONIA in the last 168 hours. Coagulation Profile: No results for input(s): INR, PROTIME in the last 168 hours. Cardiac Enzymes: No results for input(s): CKTOTAL, CKMB, CKMBINDEX, TROPONINI in the last 168 hours. BNP (last 3 results) No results for input(s): PROBNP in the last 8760 hours. HbA1C: No results for input(s): HGBA1C in the last 72 hours. CBG: No results for input(s): GLUCAP in the last 168 hours. Lipid Profile: No results for input(s): CHOL, HDL, LDLCALC, TRIG, CHOLHDL, LDLDIRECT in the last 72 hours. Thyroid Function Tests: No results for input(s): TSH, T4TOTAL, FREET4, T3FREE, THYROIDAB in the last 72 hours. Anemia Panel: No results for input(s): VITAMINB12, FOLATE, FERRITIN, TIBC, IRON, RETICCTPCT in the last 72 hours. Sepsis Labs: Recent Labs  Lab 05/06/17 0557  PROCALCITON <0.10    Recent Results (from the past 240 hour(s))  MRSA PCR Screening     Status: None   Collection Time: 05/05/17 11:59 PM  Result Value Ref Range Status   MRSA by PCR NEGATIVE NEGATIVE Final    Comment:        The GeneXpert MRSA Assay (FDA approved for NASAL specimens only), is one component of a comprehensive MRSA colonization surveillance program. It is  not intended to diagnose MRSA infection nor to guide or monitor treatment for MRSA infections.          Radiology Studies: Dg Chest 2 View  Result Date: 05/05/2017 CLINICAL DATA:  Shortness of breath and cough this morning EXAM: CHEST  2 VIEW COMPARISON:  March 14, 2017 FINDINGS: The heart size and mediastinal contours are stable. The heart size is enlarged. Patchy consolidation of bilateral lung bases are noted. There is a small right pleural effusion. There is mild interstitial edema. The visualized skeletal structures are stable. IMPRESSION: Patchy consolidation of bilateral lung bases pneumonia is not excluded. Small right pleural effusion. Mild interstitial edema.  Cardiomegaly. Electronically Signed   By: Sherian Rein M.D.   On: 05/05/2017 19:48        Scheduled Meds: . amLODipine  10 mg Oral Daily  . aspirin EC  325 mg Oral Daily  . atenolol  25 mg Oral Daily  . ceFEPime (MAXIPIME) IV  500 mg Intravenous Q24H  . cholecalciferol  5,000 Units Oral Daily  . cloNIDine  0.2 mg Oral BID  . clopidogrel  75 mg Oral Daily  . ezetimibe  10 mg Oral Daily  . furosemide  40 mg Intravenous Q12H  . heparin  5,000 Units Subcutaneous Q8H  . latanoprost  1 drop Both Eyes QHS  . PARoxetine  40 mg Oral Daily  . pravastatin  40 mg Oral q1800  . sodium bicarbonate  650 mg Oral TID  . sodium chloride flush  3 mL Intravenous Q12H   Continuous Infusions: . sodium chloride       LOS: 1 day    Time spent: 25 minutes    Kaiden Pech Salli Quarry, MD [facility] Pager [phone]  If 7PM-7AM, please contact night-coverage [email or web address] Password [facility] 05/06/2017, 12:05 PM

## 2017-05-07 ENCOUNTER — Telehealth: Payer: Self-pay

## 2017-05-07 LAB — BASIC METABOLIC PANEL
Anion gap: 10 (ref 5–15)
BUN: 59 mg/dL — AB (ref 6–20)
CALCIUM: 8.4 mg/dL — AB (ref 8.9–10.3)
CO2: 20 mmol/L — ABNORMAL LOW (ref 22–32)
Chloride: 111 mmol/L (ref 101–111)
Creatinine, Ser: 7.09 mg/dL — ABNORMAL HIGH (ref 0.44–1.00)
GFR, EST AFRICAN AMERICAN: 6 mL/min — AB (ref >60)
GFR, EST NON AFRICAN AMERICAN: 5 mL/min — AB (ref >60)
Glucose, Bld: 125 mg/dL — ABNORMAL HIGH (ref 65–99)
Potassium: 4.9 mmol/L (ref 3.5–5.1)
SODIUM: 141 mmol/L (ref 135–145)

## 2017-05-07 LAB — RENAL FUNCTION PANEL
Albumin: 2.7 g/dL — ABNORMAL LOW (ref 3.5–5.0)
Anion gap: 10 (ref 5–15)
BUN: 57 mg/dL — AB (ref 6–20)
CHLORIDE: 109 mmol/L (ref 101–111)
CO2: 20 mmol/L — AB (ref 22–32)
Calcium: 8.4 mg/dL — ABNORMAL LOW (ref 8.9–10.3)
Creatinine, Ser: 7.08 mg/dL — ABNORMAL HIGH (ref 0.44–1.00)
GFR calc Af Amer: 6 mL/min — ABNORMAL LOW (ref >60)
GFR calc non Af Amer: 5 mL/min — ABNORMAL LOW (ref >60)
Glucose, Bld: 220 mg/dL — ABNORMAL HIGH (ref 65–99)
Phosphorus: 5.3 mg/dL — ABNORMAL HIGH (ref 2.5–4.6)
Potassium: 4.6 mmol/L (ref 3.5–5.1)
SODIUM: 139 mmol/L (ref 135–145)

## 2017-05-07 LAB — CBC
HCT: 22.6 % — ABNORMAL LOW (ref 36.0–46.0)
Hemoglobin: 7.3 g/dL — ABNORMAL LOW (ref 12.0–15.0)
MCH: 28.5 pg (ref 26.0–34.0)
MCHC: 32.3 g/dL (ref 30.0–36.0)
MCV: 88.3 fL (ref 78.0–100.0)
PLATELETS: 134 10*3/uL — AB (ref 150–400)
RBC: 2.56 MIL/uL — ABNORMAL LOW (ref 3.87–5.11)
RDW: 16 % — AB (ref 11.5–15.5)
WBC: 6.4 10*3/uL (ref 4.0–10.5)

## 2017-05-07 LAB — HEPATITIS B SURFACE ANTIGEN
HEP B S AG: NEGATIVE
Hepatitis B Surface Ag: NEGATIVE

## 2017-05-07 LAB — ABO/RH: ABO/RH(D): B POS

## 2017-05-07 LAB — LEGIONELLA PNEUMOPHILA SEROGP 1 UR AG: L. pneumophila Serogp 1 Ur Ag: NEGATIVE

## 2017-05-07 LAB — GLUCOSE, CAPILLARY: Glucose-Capillary: 75 mg/dL (ref 65–99)

## 2017-05-07 LAB — PROCALCITONIN: PROCALCITONIN: 0.36 ng/mL

## 2017-05-07 LAB — PARATHYROID HORMONE, INTACT (NO CA): PTH: 150 pg/mL — ABNORMAL HIGH (ref 15–65)

## 2017-05-07 MED ORDER — LIDOCAINE-PRILOCAINE 2.5-2.5 % EX CREA
1.0000 "application " | TOPICAL_CREAM | CUTANEOUS | Status: DC | PRN
  Filled 2017-05-07: qty 5

## 2017-05-07 MED ORDER — PENTAFLUOROPROP-TETRAFLUOROETH EX AERO: 1.0000 "application " | INHALATION_SPRAY | CUTANEOUS | Status: DC | PRN

## 2017-05-07 MED ORDER — SODIUM CHLORIDE 0.9 % IV SOLN: 100.0000 mL | INTRAVENOUS | Status: DC | PRN

## 2017-05-07 MED ORDER — LIDOCAINE HCL (PF) 1 % IJ SOLN
5.0000 mL | INTRAMUSCULAR | Status: DC | PRN
  Filled 2017-05-07: qty 5

## 2017-05-07 MED ORDER — HEPARIN SODIUM (PORCINE) 1000 UNIT/ML DIALYSIS
1000.0000 [IU] | INTRAMUSCULAR | Status: DC | PRN
  Filled 2017-05-07: qty 1

## 2017-05-07 MED ORDER — SODIUM CHLORIDE 0.9 % IV SOLN: Freq: Once | INTRAVENOUS | Status: DC

## 2017-05-07 MED ORDER — ALTEPLASE 2 MG IJ SOLR
2.0000 mg | Freq: Once | INTRAMUSCULAR | Status: DC | PRN
  Filled 2017-05-07: qty 2

## 2017-05-07 NOTE — Progress Notes (Signed)
CSW received consult to set up outpatient dialysis for patient- social work unable to do this.  CSW called [facility]- they will initiate process once informe dto by nephrologist- Jay paged MD to inform.  CSW signing off  Jorge Ny, LCSW Clinical Social Worker [phone]

## 2017-05-07 NOTE — Progress Notes (Signed)
PROGRESS NOTE    [name]  JXB:[id number] DOB: 07-24-1945 DOA: 05/05/2017 PCP: Sharon Seller, NP   Brief Narrative: Patient is a 21 old female with past medical history of CKD stage V with fistula but not on dialysis yet, anxiety, hypertension, chronic anemia who presents to the emergency department for the evaluation of shortness of breath.  Patient was found to have pulmonary edema secondary to volume overload.  She was also found to be hypertensive.  Chest x-ray showed mild interstitial edema, cardiomegaly, patchy consolidation in the bilateral bases with pneumonia not excluded.  The patient was treated with IV Lasix. Nephrology has been consulted.  Patient is being started on dialysis    Assessment & Plan:   Principal Problem:   Pulmonary edema, acute (HCC) Active Problems:   Essential hypertension   Barrett esophagus   Anxiety   CKD (chronic kidney disease) stage 5, GFR less than 15 ml/min (HCC)   Acute respiratory failure with hypoxia (HCC)   Hyperkalemia   Anemia due to chronic kidney disease   Hypertensive urgency   HCAP (healthcare-associated pneumonia)  CKD stage V / ESRD : Started on dialysis her.  First dialysis on 05/07/17 . She presented with hyperkalemia and metabolic acidosis. She follows with nephrology as an outpatient.  She has fistula on the left upper extremity. Was not  started on dialysis yet.  AV fistula was put about 2 years ago.  She is not oliguric. Presented with shortness of breath secondary to volume overload and hyperkalemia with hypertensive urgency. Dialysis as per nephrology F/U PTH level.  Elevated phosphorus  Hyperkalemia: Resolved  Acute pulmonary edema/hypoxic respiratory failure: Resolved.Presented with shortness of breath.  No saturating normally on room air.  Respiratory status stable. Continue fluid restriction, daily output/input measuring  Possible HCAP: Chest x-ray showed bibasilar opacities.  Patient is afebrile, no  leukocytosis, no cough.  Discontinued antibiotics.  Hypertensive urgency: Currently blood pressure stabilizing.  Hopefully it will  improve with hemodialysis. we will continue clonidine, amlodipine, atenolol.  Anemia:  Most likely associated  with chronic kidney disease.  We will continue to monitor.  Might need erythropoietin as per nephrology.  Ferritin, iron normal.      DVT prophylaxis: [place] heparin Code Status: Full Family Communication: None presented at the bedside Disposition Plan: Home after outpatient dialysis center establishment.   Consultants: Nephrology  Procedures: None  Antimicrobials: Was on vancomycin and Cefepime . Stopped now  Subjective: Patient seen and examined at the bedside this morning.  Remains comfortable.She will be started on dialysis today  Objective: Vitals:   05/07/17 0300 05/07/17 0600 05/07/17 0820 05/07/17 1156  BP: (!) 146/71  (!) 147/78 (!) 182/86  Pulse: (!) 56  63 (!) 56  Resp: 14  19 15   Temp: 97.8 F (36.6 C)  97.9 F (36.6 C) 97.7 F (36.5 C)  TempSrc: Oral  Oral Oral  SpO2: 99%  100% 100%  Weight:  77.6 kg (171 lb 1.2 oz)    Height:        Intake/Output Summary (Last 24 hours) at 05/07/2017 1238 Last data filed at 05/07/2017 1138 Gross per 24 hour  Intake 902 ml  Output 1950 ml  Net -1048 ml   Filed Weights   05/06/17 0000 05/06/17 0425 05/07/17 0600  Weight: 78.4 kg (172 lb 13.5 oz) 78.4 kg (172 lb 13.5 oz) 77.6 kg (171 lb 1.2 oz)    Examination:  General exam: Appears calm and comfortable ,Not in distress,obese General exam: Appears  calm and comfortable ,Not in distress,average built Respiratory system: Bilateral equal air entry, normal vesicular breath sounds, no wheezes or crackles  Cardiovascular system: S1 & S2 heard, RRR. No JVD, murmurs, rubs, gallops or clicks. No pedal edema. Gastrointestinal system: Abdomen is nondistended, soft and nontender. No organomegaly or masses felt. Normal bowel sounds  heard. Central nervous system: Alert and oriented. No focal neurological deficits. Extremities: No edema, no clubbing ,no cyanosis, distal peripheral pulses palpable, AV fistula on the left arm Skin: No cyanosis,No pallor,No Rash,No Ulcer Psychiatry: Judgement and insight appear normal. Mood & affect appropriate.      Data Reviewed: I have personally reviewed following labs and imaging studies  CBC: Recent Labs  Lab 05/05/17 1813 05/07/17 0952  WBC 9.2 6.4  HGB 8.6* 7.3*  HCT 26.0* 22.6*  MCV 88.7 88.3  PLT 168 134*   Basic Metabolic Panel: Recent Labs  Lab 05/05/17 1813  05/06/17 0137 05/06/17 0557 05/06/17 1126 05/06/17 2000 05/07/17 0422 05/07/17 0952  NA 139  --   --  145  --   --  141 139  K 6.0*   < > 5.0 5.1 5.0  --  4.9 4.6  CL 112*  --   --  114*  --   --  111 109  CO2 17*  --   --  21*  --   --  20* 20*  GLUCOSE 102*  --   --  93  --   --  125* 220*  BUN 64*  --   --  63*  --   --  59* 57*  CREATININE 6.61*  --   --  6.81*  --   --  7.09* 7.08*  CALCIUM 8.4*  --   --  8.7*  --   --  8.4* 8.4*  PHOS  --   --   --   --   --  5.3*  --  5.3*   < > = values in this interval not displayed.   GFR: Estimated Creatinine Clearance: 7.5 mL/min (A) (by C-G formula based on SCr of 7.08 mg/dL (H)). Liver Function Tests: Recent Labs  Lab 05/07/17 0952  ALBUMIN 2.7*   No results for input(s): LIPASE, AMYLASE in the last 168 hours. No results for input(s): AMMONIA in the last 168 hours. Coagulation Profile: No results for input(s): INR, PROTIME in the last 168 hours. Cardiac Enzymes: No results for input(s): CKTOTAL, CKMB, CKMBINDEX, TROPONINI in the last 168 hours. BNP (last 3 results) No results for input(s): PROBNP in the last 8760 hours. HbA1C: No results for input(s): HGBA1C in the last 72 hours. CBG: Recent Labs  Lab 05/06/17 0001  GLUCAP 75   Lipid Profile: No results for input(s): CHOL, HDL, LDLCALC, TRIG, CHOLHDL, LDLDIRECT in the last 72  hours. Thyroid Function Tests: No results for input(s): TSH, T4TOTAL, FREET4, T3FREE, THYROIDAB in the last 72 hours. Anemia Panel: Recent Labs    05/06/17 2000  FERRITIN 232  TIBC 242*  IRON 32   Sepsis Labs: Recent Labs  Lab 05/06/17 0557 05/07/17 0422  PROCALCITON <0.10 0.36    Recent Results (from the past 240 hour(s))  MRSA PCR Screening     Status: None   Collection Time: 05/05/17 11:59 PM  Result Value Ref Range Status   MRSA by PCR NEGATIVE NEGATIVE Final    Comment:        The GeneXpert MRSA Assay (FDA approved for NASAL specimens only), is one component of a comprehensive  MRSA colonization surveillance program. It is not intended to diagnose MRSA infection nor to guide or monitor treatment for MRSA infections.          Radiology Studies: Dg Chest 2 View  Result Date: 05/05/2017 CLINICAL DATA:  Shortness of breath and cough this morning EXAM: CHEST  2 VIEW COMPARISON:  March 14, 2017 FINDINGS: The heart size and mediastinal contours are stable. The heart size is enlarged. Patchy consolidation of bilateral lung bases are noted. There is a small right pleural effusion. There is mild interstitial edema. The visualized skeletal structures are stable. IMPRESSION: Patchy consolidation of bilateral lung bases pneumonia is not excluded. Small right pleural effusion. Mild interstitial edema.  Cardiomegaly. Electronically Signed   By: Sherian Rein M.D.   On: 05/05/2017 19:48        Scheduled Meds: . amLODipine  10 mg Oral Daily  . aspirin EC  325 mg Oral Daily  . atenolol  25 mg Oral Daily  . cholecalciferol  5,000 Units Oral Daily  . cloNIDine  0.2 mg Oral BID  . clopidogrel  75 mg Oral Daily  . ezetimibe  10 mg Oral Daily  . heparin  5,000 Units Subcutaneous Q8H  . latanoprost  1 drop Both Eyes QHS  . PARoxetine  40 mg Oral Daily  . pravastatin  40 mg Oral q1800  . sodium chloride flush  3 mL Intravenous Q12H   Continuous Infusions: . sodium  chloride    . sodium chloride    . sodium chloride    . furosemide Stopped (05/06/17 2300)     LOS: 2 days    Time spent: 25 minutes    Latanya Hemmer Salli Quarry, MD [facility] Pager [phone]  If 7PM-7AM, please contact night-coverage [email or web address] Password [facility] 05/07/2017, 12:38 PM

## 2017-05-07 NOTE — Procedures (Signed)
Patient seen and examined on Hemodialysis. QB 250 mL/ min, UF goal 500 mL Treatment adjusted as needed.  Madelon Lips MD [facility] pgr [phone] 4:05 PM

## 2017-05-07 NOTE — Progress Notes (Signed)
Nephrology MD paged in regards to HD treatment - notes from yesterday indicate patient to begin treatment today but no orders at this time.

## 2017-05-07 NOTE — Progress Notes (Signed)
  [facility] Progress Note   Assessment/ Plan:   1. 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  She has an AVG which is ready to use.  Have d/w pt and questions answered.  2. Vol overload - pulm edema, as above. Doubt HCAP.  Has some increased UOP with IV Lasix.   3. HTN - cont meds 4. 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, phos   Subjective:    For HD #1 today   Objective:   BP (!) 147/78 (BP Location: Right Arm)   Pulse 63   Temp 97.9 F (36.6 C) (Oral)   Resp 19   Ht 5\' 5"  (1.651 m)   Wt 77.6 kg (171 lb 1.2 oz)   SpO2 100%   BMI 28.47 kg/m   Physical Exam: [id number] woman sitting in bed, mildly SOB CVS: RRR Resp: bialteral crackles 1/2 way up lung fields Abd:+ mildly distended Ext:1+ LE edema  Labs: BMET Recent Labs  Lab 05/05/17 1813 05/05/17 1949 05/06/17 0137 05/06/17 0557 05/06/17 1126 05/06/17 2000 05/07/17 0422  NA 139  --   --  145  --   --  141  K 6.0* 7.5* 5.0 5.1 5.0  --  4.9  CL 112*  --   --  114*  --   --  111  CO2 17*  --   --  21*  --   --  20*  GLUCOSE 102*  --   --  93  --   --  125*  BUN 64*  --   --  63*  --   --  59*  CREATININE 6.61*  --   --  6.81*  --   --  7.09*  CALCIUM 8.4*  --   --  8.7*  --   --  8.4*  PHOS  --   --   --   --   --  5.3*  --    CBC Recent Labs  Lab 05/05/17 1813  WBC 9.2  HGB 8.6*  HCT 26.0*  MCV 88.7  PLT 168    @IMGRELPRIORS @ Medications:    . amLODipine  10 mg Oral Daily  . aspirin EC  325 mg Oral Daily  . atenolol  25 mg Oral Daily  . cholecalciferol  5,000 Units Oral Daily  . cloNIDine  0.2 mg Oral BID  . clopidogrel  75 mg Oral Daily  . ezetimibe  10 mg Oral Daily  . heparin  5,000 Units Subcutaneous Q8H  . latanoprost  1 drop Both Eyes QHS  . PARoxetine  40 mg Oral Daily  . pravastatin  40 mg Oral q1800  . sodium chloride flush  3 mL Intravenous Q12H     Madelon Lips,  MD [facility] pgr [phone] 05/07/2017, 9:58 AM

## 2017-05-07 NOTE — Consult Note (Signed)
   [facility] CM Inpatient Consult   05/07/2017  ISEL SKUFCA 03-27-1946 [id number]  Referral received from inpatient RNCM, Neoma Laming, patient had been active with [facility] but there was difficulty maintaining contact per [facility] note. In the [place]. Primary Care Provider [name], NP.   Met with the patient at the bedside.  Patient states,"I was staying with my brother and I was hoping for them [[facility]] to call me there.  I will probably being staying with him again in [address] and his number is [phone]."  I rarely use my phone. Will assign patient for community follow up, new dialysis and transportation needs when appropriate. Will follow for progress. Patient was to receive her first dialysis.    For questions, please contact:  Natividad Brood, RN BSN [facility] Liaison  [phone] business mobile phone Toll free office [phone]

## 2017-05-07 NOTE — Telephone Encounter (Signed)
Sent notes to scheduling 

## 2017-05-08 LAB — HEPATITIS B CORE ANTIBODY, TOTAL: Hep B Core Total Ab: NEGATIVE

## 2017-05-08 LAB — CBC
HEMATOCRIT: 23.1 % — AB (ref 36.0–46.0)
HEMOGLOBIN: 7.5 g/dL — AB (ref 12.0–15.0)
MCH: 28.3 pg (ref 26.0–34.0)
MCHC: 32.5 g/dL (ref 30.0–36.0)
MCV: 87.2 fL (ref 78.0–100.0)
Platelets: 155 10*3/uL (ref 150–400)
RBC: 2.65 MIL/uL — ABNORMAL LOW (ref 3.87–5.11)
RDW: 15.5 % (ref 11.5–15.5)
WBC: 6.5 10*3/uL (ref 4.0–10.5)

## 2017-05-08 LAB — HEPATITIS B CORE ANTIBODY, IGM: HEP B C IGM: NEGATIVE

## 2017-05-08 LAB — BASIC METABOLIC PANEL
ANION GAP: 11 (ref 5–15)
BUN: 40 mg/dL — ABNORMAL HIGH (ref 6–20)
CALCIUM: 8.2 mg/dL — AB (ref 8.9–10.3)
CO2: 23 mmol/L (ref 22–32)
CREATININE: 5.75 mg/dL — AB (ref 0.44–1.00)
Chloride: 106 mmol/L (ref 101–111)
GFR, EST AFRICAN AMERICAN: 8 mL/min — AB (ref >60)
GFR, EST NON AFRICAN AMERICAN: 7 mL/min — AB (ref >60)
Glucose, Bld: 121 mg/dL — ABNORMAL HIGH (ref 65–99)
Potassium: 4.3 mmol/L (ref 3.5–5.1)
Sodium: 140 mmol/L (ref 135–145)

## 2017-05-08 LAB — HEPATITIS B SURFACE ANTIBODY,QUALITATIVE
Hep B S Ab: NONREACTIVE
Hep B S Ab: NONREACTIVE

## 2017-05-08 LAB — PREPARE RBC (CROSSMATCH)

## 2017-05-08 MED ORDER — DARBEPOETIN ALFA 200 MCG/0.4ML IJ SOSY: 200.0000 ug | PREFILLED_SYRINGE | INTRAMUSCULAR | Status: DC

## 2017-05-08 MED ORDER — SODIUM CHLORIDE 0.9 % IV SOLN: Freq: Once | INTRAVENOUS | Status: DC

## 2017-05-08 MED ORDER — SODIUM CHLORIDE 0.9 % IV SOLN
125.0000 mg | INTRAVENOUS | Status: DC
  Administered 2017-05-11: 125 mg via INTRAVENOUS
  Filled 2017-05-08 (×3): qty 10

## 2017-05-08 MED ORDER — ASPIRIN 81 MG PO CHEW
81.0000 mg | CHEWABLE_TABLET | Freq: Every day | ORAL | Status: DC
  Administered 2017-05-09 – 2017-05-12 (×4): 81 mg via ORAL
  Filled 2017-05-08 (×4): qty 1

## 2017-05-08 MED ORDER — SENNA 8.6 MG PO TABS
1.0000 | ORAL_TABLET | Freq: Every day | ORAL | Status: DC
  Administered 2017-05-08 – 2017-05-12 (×5): 8.6 mg via ORAL
  Filled 2017-05-08 (×5): qty 1

## 2017-05-08 MED ORDER — DOCUSATE SODIUM 100 MG PO CAPS
100.0000 mg | ORAL_CAPSULE | Freq: Two times a day (BID) | ORAL | Status: DC
  Administered 2017-05-08 – 2017-05-12 (×9): 100 mg via ORAL
  Filled 2017-05-08 (×9): qty 1

## 2017-05-08 NOTE — Progress Notes (Signed)
HD tx completed @ 1940 w/o problem, other than inc bp throughout tx, pt received one unit PRBC on HD tx w/ no s/s of reaction, UF goal met, blood rinsed back, report called to Kerman Passey, RN

## 2017-05-08 NOTE — Plan of Care (Signed)
Pt with no s/s infection at this time

## 2017-05-08 NOTE — Progress Notes (Signed)
HD tx initiated via 16G x2 by Kennon Rounds, RN w/o problem, pull/push/flush equally w/o problem per report, VSS w/ increased BP, I received pt at 1810, pt about to receive 1 unit PRBC on HD tx, will cont to monitor while on HD tx

## 2017-05-08 NOTE — Progress Notes (Signed)
1533 - Attempted report, accepting RN currently getting report from ED. 1553 - Attempted report, accepting RN receiving patient from ED. 1623 - Report given to [facility], RN - pt going to [phone], pt is in HD at the moment with all belongings and will go to 5W13 from HS when finished.

## 2017-05-08 NOTE — Progress Notes (Signed)
  [facility] Progress Note   Assessment/ Plan:   1. 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  CLIP in process.  Using AVG without issue.  Have d/w pt and questions answered.  2. Vol overload - pulm edema, as above. Doubt HCAP.  Has some increased UOP with IV Lasix.  Improved.   3. HTN - cont meds 4. 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  For HD #2 today.     Objective:   BP (!) 164/68 (BP Location: Right Arm)   Pulse (!) 57   Temp 98 F (36.7 C) (Oral)   Resp 12   Ht 5\' 5"  (1.651 m)   Wt 78 kg (171 lb 15.3 oz)   SpO2 98%   BMI 28.62 kg/m   Physical Exam: VZC:HYIFO woman sitting in bed, comfortable CVS: RRR Resp: normal WOB, crackles much improved Abd:+ mildly distended YDX:AJOIN LE edema, improved  Labs: BMET Recent Labs  Lab 05/05/17 1813 05/05/17 1949 05/06/17 0137 05/06/17 0557 05/06/17 1126 05/06/17 2000 05/07/17 0422 05/07/17 0952 05/08/17 0418  NA 139  --   --  145  --   --  141 139 140  K 6.0* 7.5* 5.0 5.1 5.0  --  4.9 4.6 4.3  CL 112*  --   --  114*  --   --  111 109 106  CO2 17*  --   --  21*  --   --  20* 20* 23  GLUCOSE 102*  --   --  93  --   --  125* 220* 121*  BUN 64*  --   --  63*  --   --  59* 57* 40*  CREATININE 6.61*  --   --  6.81*  --   --  7.09* 7.08* 5.75*  CALCIUM 8.4*  --   --  8.7*  --   --  8.4* 8.4* 8.2*  PHOS  --   --   --   --   --  5.3*  --  5.3*  --    CBC Recent Labs  Lab 05/05/17 1813 05/07/17 0952  WBC 9.2 6.4  HGB 8.6* 7.3*  HCT 26.0* 22.6*  MCV 88.7 88.3  PLT 168 134*    @IMGRELPRIORS @ Medications:    . amLODipine  10 mg Oral Daily  . aspirin EC  325 mg Oral Daily  . atenolol  25 mg Oral Daily  . cholecalciferol  5,000 Units Oral Daily  . cloNIDine  0.2 mg Oral BID  . clopidogrel  75 mg Oral Daily  . docusate sodium  100 mg  Oral BID  . ezetimibe  10 mg Oral Daily  . heparin  5,000 Units Subcutaneous Q8H  . latanoprost  1 drop Both Eyes QHS  . PARoxetine  40 mg Oral Daily  . pravastatin  40 mg Oral q1800  . senna  1 tablet Oral Daily  . sodium chloride flush  3 mL Intravenous Q12H     Madelon Lips, MD [facility] pgr [phone] 05/08/2017, 1:09 PM

## 2017-05-08 NOTE — Progress Notes (Signed)
PROGRESS NOTE    [name]  QMV:[id number] DOB: [birth year] DOA: 05/05/2017 PCP: Sharon Seller, NP   Brief Narrative: Patient is a 9 old female with past medical history of CKD stage V with fistula but not on dialysis yet, anxiety, hypertension, chronic anemia who presents to the emergency department for the evaluation of shortness of breath.  Patient was found to have pulmonary edema secondary to volume overload.  She was also found to be hypertensive.  Chest x-ray showed mild interstitial edema, cardiomegaly, patchy consolidation in the bilateral bases with pneumonia not excluded.  The patient was started on  IV Lasix.Nephrology was consulted.  Patient has been  started on dialysis.  She is waiting for final arrangement of outpatient dialysis center.    Assessment & Plan:   Principal Problem:   Pulmonary edema, acute (HCC) Active Problems:   Essential hypertension   Barrett esophagus   Anxiety   CKD (chronic kidney disease) stage 5, GFR less than 15 ml/min (HCC)   Acute respiratory failure with hypoxia (HCC)   Hyperkalemia   Anemia due to chronic kidney disease   Hypertensive urgency   HCAP (healthcare-associated pneumonia)  CKD stage V / ESRD : Started on dialysis her.  First dialysis on 05/07/17 . She presented with hyperkalemia and metabolic acidosis. She follows with nephrology as an outpatient.  She has fistula on the left upper extremity. Was not  started on dialysis yet.  AV fistula was put about 2 years ago.  She is not oliguric. Presented with shortness of breath secondary to volume overload and hyperkalemia with hypertensive urgency. PTH elevated at 150. Dialysis started  as per nephrology.  Outpatient dialysis placement is in process.  Hyperkalemia: Resolved  Acute pulmonary edema/hypoxic respiratory failure: Resolved.Presented with shortness of breath.  Now saturating normally on room air.  Respiratory status stable. Continue fluid restriction, daily  output/input measuring  Possible HCAP: Suspected on admission.Chest x-ray showed bibasilar opacities.  Patient is afebrile, no leukocytosis, no cough.  Discontinued antibiotics.  Hypertensive urgency: Currently blood pressure stabilizing.  Hopefully it will  improve with ongoing  hemodialysis. We will continue clonidine, amlodipine, atenolol.  Anemia:  Most likely associated  with chronic kidney disease.  We will continue to monitor.  Nephrology starting on ESA,IV iron.  Ferritin, iron normal.  H/O CVA/Hyperlipidemia: On aspirin and Plavix at home.  Aspirin dose reduced to 81 mg daily from 325 mg.  Continue statin.   DVT prophylaxis: [place] heparin Code Status: Full Family Communication: None presented at the bedside Disposition Plan: Home after outpatient dialysis center establishment.   Consultants: Nephrology  Procedures: None  Antimicrobials: Was on vancomycin and Cefepime . Stopped now  Subjective: Patient seen and examined the bedside this morning.  Remains comfortable.  Denies any shortness of breath.  Shee is undergoing  dialysis again today.  Objective: Vitals:   05/08/17 0000 05/08/17 0419 05/08/17 0800 05/08/17 1219  BP: (!) 155/66 (!) 161/72 (!) 161/75 (!) 164/68  Pulse: (!) 59 (!) 58 (!) 58 (!) 57  Resp: 16 16 16 12   Temp: 98.2 F (36.8 C) (!) 97.5 F (36.4 C) 98 F (36.7 C) 98 F (36.7 C)  TempSrc: Oral Oral Oral Oral  SpO2: 96% 100% 100% 98%  Weight:  78 kg (171 lb 15.3 oz)    Height:        Intake/Output Summary (Last 24 hours) at 05/08/2017 1444 Last data filed at 05/08/2017 1418 Gross per 24 hour  Intake 493 ml  Output  751 ml  Net -258 ml   Filed Weights   05/07/17 0600 05/07/17 1617 05/08/17 0419  Weight: 77.6 kg (171 lb 1.2 oz) 76.3 kg (168 lb 3.4 oz) 78 kg (171 lb 15.3 oz)    Examination:  General exam: Appears calm and comfortable ,Not in distress,obese Respiratory system: Bilateral equal air entry, normal vesicular breath sounds, no wheezes  or crackles  Cardiovascular system: S1 & S2 heard, RRR. No JVD, murmurs, rubs, gallops or clicks. No pedal edema. Gastrointestinal system: Abdomen is nondistended, soft and nontender. No organomegaly or masses felt. Normal bowel sounds heard. Central nervous system: Alert and oriented. No focal neurological deficits. Extremities: Trace edema, no clubbing ,no cyanosis, distal peripheral pulses palpable, AV fistula on the left arm Skin: No cyanosis,No pallor,No Rash,No Ulcer Psychiatry: Judgement and insight appear normal. Mood & affect appropriate.       Data Reviewed: I have personally reviewed following labs and imaging studies  CBC: Recent Labs  Lab 05/05/17 1813 05/07/17 0952  WBC 9.2 6.4  HGB 8.6* 7.3*  HCT 26.0* 22.6*  MCV 88.7 88.3  PLT 168 134*   Basic Metabolic Panel: Recent Labs  Lab 05/05/17 1813  05/06/17 0557 05/06/17 1126 05/06/17 2000 05/07/17 0422 05/07/17 0952 05/08/17 0418  NA 139  --  145  --   --  141 139 140  K 6.0*   < > 5.1 5.0  --  4.9 4.6 4.3  CL 112*  --  114*  --   --  111 109 106  CO2 17*  --  21*  --   --  20* 20* 23  GLUCOSE 102*  --  93  --   --  125* 220* 121*  BUN 64*  --  63*  --   --  59* 57* 40*  CREATININE 6.61*  --  6.81*  --   --  7.09* 7.08* 5.75*  CALCIUM 8.4*  --  8.7*  --   --  8.4* 8.4* 8.2*  PHOS  --   --   --   --  5.3*  --  5.3*  --    < > = values in this interval not displayed.   GFR: Estimated Creatinine Clearance: 9.3 mL/min (A) (by C-G formula based on SCr of 5.75 mg/dL (H)). Liver Function Tests: Recent Labs  Lab 05/07/17 0952  ALBUMIN 2.7*   No results for input(s): LIPASE, AMYLASE in the last 168 hours. No results for input(s): AMMONIA in the last 168 hours. Coagulation Profile: No results for input(s): INR, PROTIME in the last 168 hours. Cardiac Enzymes: No results for input(s): CKTOTAL, CKMB, CKMBINDEX, TROPONINI in the last 168 hours. BNP (last 3 results) No results for input(s): PROBNP in the last  8760 hours. HbA1C: No results for input(s): HGBA1C in the last 72 hours. CBG: Recent Labs  Lab 05/06/17 0001  GLUCAP 75   Lipid Profile: No results for input(s): CHOL, HDL, LDLCALC, TRIG, CHOLHDL, LDLDIRECT in the last 72 hours. Thyroid Function Tests: No results for input(s): TSH, T4TOTAL, FREET4, T3FREE, THYROIDAB in the last 72 hours. Anemia Panel: Recent Labs    05/06/17 2000  FERRITIN 232  TIBC 242*  IRON 32   Sepsis Labs: Recent Labs  Lab 05/06/17 0557 05/07/17 0422  PROCALCITON <0.10 0.36    Recent Results (from the past 240 hour(s))  MRSA PCR Screening     Status: None   Collection Time: 05/05/17 11:59 PM  Result Value Ref Range Status   MRSA  by PCR NEGATIVE NEGATIVE Final    Comment:        The GeneXpert MRSA Assay (FDA approved for NASAL specimens only), is one component of a comprehensive MRSA colonization surveillance program. It is not intended to diagnose MRSA infection nor to guide or monitor treatment for MRSA infections.          Radiology Studies: No results found.      Scheduled Meds: . amLODipine  10 mg Oral Daily  . aspirin EC  325 mg Oral Daily  . atenolol  25 mg Oral Daily  . cholecalciferol  5,000 Units Oral Daily  . cloNIDine  0.2 mg Oral BID  . clopidogrel  75 mg Oral Daily  . [START ON 05/15/2017] darbepoetin (ARANESP) injection - DIALYSIS  200 mcg Intravenous Q Tue-HD  . docusate sodium  100 mg Oral BID  . ezetimibe  10 mg Oral Daily  . heparin  5,000 Units Subcutaneous Q8H  . latanoprost  1 drop Both Eyes QHS  . PARoxetine  40 mg Oral Daily  . pravastatin  40 mg Oral q1800  . senna  1 tablet Oral Daily  . sodium chloride flush  3 mL Intravenous Q12H   Continuous Infusions: . sodium chloride    . sodium chloride    . sodium chloride    . sodium chloride    . [START ON 05/10/2017] ferric gluconate (FERRLECIT/NULECIT) IV       LOS: 3 days    Time spent: 25 minutes    Deshae Dickison Salli Quarry, MD [facility] Pager [phone]  If 7PM-7AM, please contact night-coverage [email or web address] Password [facility] 05/08/2017, 2:44 PM

## 2017-05-09 DIAGNOSIS — N185 Chronic kidney disease, stage 5: Secondary | ICD-10-CM

## 2017-05-09 LAB — BPAM RBC
BLOOD PRODUCT EXPIRATION DATE: 201902012359
ISSUE DATE / TIME: 201901151756
UNIT TYPE AND RH: 7300

## 2017-05-09 LAB — CBC WITH DIFFERENTIAL/PLATELET
BASOS PCT: 0 %
Basophils Absolute: 0 10*3/uL (ref 0.0–0.1)
EOS ABS: 0.3 10*3/uL (ref 0.0–0.7)
Eosinophils Relative: 5 %
HCT: 26.9 % — ABNORMAL LOW (ref 36.0–46.0)
HEMOGLOBIN: 9 g/dL — AB (ref 12.0–15.0)
LYMPHS ABS: 2.2 10*3/uL (ref 0.7–4.0)
Lymphocytes Relative: 30 %
MCH: 29.1 pg (ref 26.0–34.0)
MCHC: 33.5 g/dL (ref 30.0–36.0)
MCV: 87.1 fL (ref 78.0–100.0)
Monocytes Absolute: 0.4 10*3/uL (ref 0.1–1.0)
Monocytes Relative: 5 %
Neutro Abs: 4.6 10*3/uL (ref 1.7–7.7)
Neutrophils Relative %: 60 %
Platelets: 139 10*3/uL — ABNORMAL LOW (ref 150–400)
RBC: 3.09 MIL/uL — AB (ref 3.87–5.11)
RDW: 15.5 % (ref 11.5–15.5)
WBC: 7.5 10*3/uL (ref 4.0–10.5)

## 2017-05-09 LAB — BASIC METABOLIC PANEL
Anion gap: 11 (ref 5–15)
BUN: 24 mg/dL — ABNORMAL HIGH (ref 6–20)
CHLORIDE: 104 mmol/L (ref 101–111)
CO2: 24 mmol/L (ref 22–32)
CREATININE: 4.39 mg/dL — AB (ref 0.44–1.00)
Calcium: 8.6 mg/dL — ABNORMAL LOW (ref 8.9–10.3)
GFR calc non Af Amer: 9 mL/min — ABNORMAL LOW (ref >60)
GFR, EST AFRICAN AMERICAN: 11 mL/min — AB (ref >60)
Glucose, Bld: 121 mg/dL — ABNORMAL HIGH (ref 65–99)
Potassium: 4.3 mmol/L (ref 3.5–5.1)
Sodium: 139 mmol/L (ref 135–145)

## 2017-05-09 LAB — TYPE AND SCREEN
ABO/RH(D): B POS
Antibody Screen: NEGATIVE
Unit division: 0

## 2017-05-09 NOTE — Progress Notes (Signed)
[facility] Progress Note   Assessment/ Plan:   1. 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  CLIP in process.  Using AVG without issue.  Have d/w pt and questions answered.  2. Vol overload  Improving with HD 3. HTN - cont meds 4. Anemia of CKD - hgb 9.0, improving 5. MBD of CKD: PTH 150, phos 5.3 6. Dispo: CLIP pending   Subjective:    HD #3 today.  Pt reports being bored.    Objective:   BP (!) 191/96   Pulse (!) 57   Temp 98.4 F (36.9 C) (Oral)   Resp 16   Ht 5\' 5"  (1.651 m)   Wt 72.7 kg (160 lb 4.4 oz) Comment: standing weight  SpO2 100%   BMI 26.67 kg/m   Physical Exam: [id number]:[id number] woman sitting in bed, comfortable CVS: RRR Resp: normal WOB, clear lungs Abd: no abd distention TDD:UKGUR LE edema, improved  Labs: BMET Recent Labs  Lab 05/05/17 1813  05/06/17 0137 05/06/17 0557 05/06/17 1126 05/06/17 2000 05/07/17 0422 05/07/17 0952 05/08/17 0418 05/09/17 0434  NA 139  --   --  145  --   --  141 139 140 139  K 6.0*   < > 5.0 5.1 5.0  --  4.9 4.6 4.3 4.3  CL 112*  --   --  114*  --   --  111 109 106 104  CO2 17*  --   --  21*  --   --  20* 20* 23 24  GLUCOSE 102*  --   --  93  --   --  125* 220* 121* 121*  BUN 64*  --   --  63*  --   --  59* 57* 40* 24*  CREATININE 6.61*  --   --  6.81*  --   --  7.09* 7.08* 5.75* 4.39*  CALCIUM 8.4*  --   --  8.7*  --   --  8.4* 8.4* 8.2* 8.6*  PHOS  --   --   --   --   --  5.3*  --  5.3*  --   --    < > = values in this interval not displayed.   CBC Recent Labs  Lab 05/05/17 1813 05/07/17 0952 05/08/17 1729 05/09/17 0434  WBC 9.2 6.4 6.5 7.5  NEUTROABS  --   --   --  4.6  HGB 8.6* 7.3* 7.5* 9.0*  HCT 26.0* 22.6* 23.1* 26.9*  MCV 88.7 88.3 87.2 87.1  PLT 168 134* 155 139*    @IMGRELPRIORS @ Medications:    . amLODipine  10 mg Oral Daily  . aspirin  81 mg Oral Daily  . atenolol  25 mg Oral Daily  . cholecalciferol  5,000 Units Oral  Daily  . cloNIDine  0.2 mg Oral BID  . clopidogrel  75 mg Oral Daily  . [START ON 05/15/2017] darbepoetin (ARANESP) injection - DIALYSIS  200 mcg Intravenous Q Tue-HD  . docusate sodium  100 mg Oral BID  . ezetimibe  10 mg Oral Daily  . heparin  5,000 Units Subcutaneous Q8H  . latanoprost  1 drop Both Eyes QHS  . PARoxetine  40 mg Oral Daily  . pravastatin  40 mg Oral q1800  . senna  1 tablet Oral Daily  . sodium chloride flush  3 mL Intravenous Q12H     Madelon Lips, MD [facility] pgr [phone]  05/09/2017, 2:19 PM

## 2017-05-09 NOTE — Progress Notes (Signed)
PROGRESS NOTE    [name]  FAO:[id number] DOB: April 23, 1946 DOA: 05/05/2017 PCP: [name], NP   Brief Narrative: Patient is a 66 old female with past medical history of CKD stage V with fistula but not on dialysis yet, anxiety, hypertension, chronic anemia who presents to the emergency department for the evaluation of shortness of breath.  Patient was found to have pulmonary edema secondary to volume overload.  She was also found to be hypertensive.  Chest x-ray showed mild interstitial edema, cardiomegaly, patchy consolidation in the bilateral bases with pneumonia not excluded.  The patient was started on  IV Lasix.Nephrology was consulted.  Patient has been  started on dialysis.  She is waiting for final arrangement of outpatient dialysis center.    Assessment & Plan:   Principal Problem:   CKD (chronic kidney disease) stage 5, GFR less than 15 ml/min (HCC) Active Problems:   Essential hypertension   Pulmonary edema, acute (HCC)   Acute respiratory failure with hypoxia (HCC)   Hyperkalemia   Anemia due to chronic kidney disease   Hypertensive urgency  1-CKD stage V / ESRD : Started on dialysis her.  First dialysis on 05/07/17 . She presented with hyperkalemia and metabolic acidosis. She follows with nephrology as an outpatient.  She has fistula on the left upper extremity. Was not  started on dialysis yet.  AV fistula was put about 2 years ago.   Presented with shortness of breath secondary to volume overload and hyperkalemia with hypertensive urgency. PTH elevated at 150. Dialysis started  as per nephrology.  Outpatient dialysis placement is in process. Awaiting clip.  HD per nephrologist   Hyperkalemia: Resolved  Acute pulmonary edema/hypoxic respiratory failure: Resolved.Presented with shortness of breath.  Now saturating normally on room air.  Respiratory status stable. Continue fluid restriction, daily output/input measuring  Possible HCAP: Suspected on  admission.Chest x-ray showed bibasilar opacities.  Patient is afebrile, no leukocytosis, no cough.  Discontinued antibiotics.  Hypertensive urgency: Currently blood pressure stabilizing. We will continue clonidine, amlodipine, atenolol.  Anemia:  Most likely associated  with chronic kidney disease.   Nephrology starting on ESA,IV iron.  Ferritin, iron normal. Follow trend   H/O CVA/Hyperlipidemia: On aspirin and Plavix at home.  Aspirin dose reduced to 81 mg daily from 325 mg.  Continue statin.   DVT prophylaxis: [place] heparin Code Status: Full Family Communication: None presented at the bedside Disposition Plan: Home after outpatient dialysis center establishment.   Consultants: Nephrology  Procedures: None  Antimicrobials: Was on vancomycin and Cefepime . Stopped now  Subjective: No new complaints. Dyspnea improved.    Objective: Vitals:   05/09/17 1500 05/09/17 1530 05/09/17 1600 05/09/17 1613  BP: (!) 171/82 (!) 168/88 (!) 155/72 (!) 176/80  Pulse: (!) 58 (!) 59 67 63  Resp: 20 20 20 19   Temp:    98.4 F (36.9 C)  TempSrc:    Oral  SpO2:    100%  Weight:    73.2 kg (161 lb 6 oz)  Height:        Intake/Output Summary (Last 24 hours) at 05/09/2017 1658 Last data filed at 05/09/2017 1613 Gross per 24 hour  Intake 561 ml  Output 1506 ml  Net -945 ml   Filed Weights   05/08/17 2103 05/09/17 1229 05/09/17 1613  Weight: 74.1 kg (163 lb 5.8 oz) 77.2 kg (170 lb 3.1 oz) 73.2 kg (161 lb 6 oz)    Examination:  General exam: NAD Respiratory system:CTA Cardiovascular system:  S 1, S 2 RRR Gastrointestinal system: BS present, soft, nt Central nervous system: non focal.  Extremities: , trace edemaAV fistula on the left arm Skin: No cyanosis,No pallor,No Rash,No Ulcer    Data Reviewed: I have personally reviewed following labs and imaging studies  CBC: Recent Labs  Lab 05/05/17 1813 05/07/17 0952 05/08/17 1729 05/09/17 0434  WBC 9.2 6.4 6.5 7.5  NEUTROABS  --    --   --  4.6  HGB 8.6* 7.3* 7.5* 9.0*  HCT 26.0* 22.6* 23.1* 26.9*  MCV 88.7 88.3 87.2 87.1  PLT 168 134* 155 409*   Basic Metabolic Panel: Recent Labs  Lab 05/06/17 0557 05/06/17 1126 05/06/17 2000 05/07/17 0422 05/07/17 0952 05/08/17 0418 05/09/17 0434  NA 145  --   --  141 139 140 139  K 5.1 5.0  --  4.9 4.6 4.3 4.3  CL 114*  --   --  111 109 106 104  CO2 21*  --   --  20* 20* 23 24  GLUCOSE 93  --   --  125* 220* 121* 121*  BUN 63*  --   --  59* 57* 40* 24*  CREATININE 6.81*  --   --  7.09* 7.08* 5.75* 4.39*  CALCIUM 8.7*  --   --  8.4* 8.4* 8.2* 8.6*  PHOS  --   --  5.3*  --  5.3*  --   --    GFR: Estimated Creatinine Clearance: 11.8 mL/min (A) (by C-G formula based on SCr of 4.39 mg/dL (H)). Liver Function Tests: Recent Labs  Lab 05/07/17 0952  ALBUMIN 2.7*   No results for input(s): LIPASE, AMYLASE in the last 168 hours. No results for input(s): AMMONIA in the last 168 hours. Coagulation Profile: No results for input(s): INR, PROTIME in the last 168 hours. Cardiac Enzymes: No results for input(s): CKTOTAL, CKMB, CKMBINDEX, TROPONINI in the last 168 hours. BNP (last 3 results) No results for input(s): PROBNP in the last 8760 hours. HbA1C: No results for input(s): HGBA1C in the last 72 hours. CBG: Recent Labs  Lab 05/06/17 0001  GLUCAP 75   Lipid Profile: No results for input(s): CHOL, HDL, LDLCALC, TRIG, CHOLHDL, LDLDIRECT in the last 72 hours. Thyroid Function Tests: No results for input(s): TSH, T4TOTAL, FREET4, T3FREE, THYROIDAB in the last 72 hours. Anemia Panel: Recent Labs    05/06/17 2000  FERRITIN 232  TIBC 242*  IRON 32   Sepsis Labs: Recent Labs  Lab 05/06/17 0557 05/07/17 0422  PROCALCITON <0.10 0.36    Recent Results (from the past 240 hour(s))  MRSA PCR Screening     Status: None   Collection Time: 05/05/17 11:59 PM  Result Value Ref Range Status   MRSA by PCR NEGATIVE NEGATIVE Final    Comment:        The GeneXpert MRSA  Assay (FDA approved for NASAL specimens only), is one component of a comprehensive MRSA colonization surveillance program. It is not intended to diagnose MRSA infection nor to guide or monitor treatment for MRSA infections.          Radiology Studies: No results found.      Scheduled Meds: . amLODipine  10 mg Oral Daily  . aspirin  81 mg Oral Daily  . atenolol  25 mg Oral Daily  . cholecalciferol  5,000 Units Oral Daily  . cloNIDine  0.2 mg Oral BID  . clopidogrel  75 mg Oral Daily  . [START ON 05/15/2017] darbepoetin (ARANESP) injection -  DIALYSIS  200 mcg Intravenous Q Tue-HD  . docusate sodium  100 mg Oral BID  . ezetimibe  10 mg Oral Daily  . heparin  5,000 Units Subcutaneous Q8H  . latanoprost  1 drop Both Eyes QHS  . PARoxetine  40 mg Oral Daily  . pravastatin  40 mg Oral q1800  . senna  1 tablet Oral Daily  . sodium chloride flush  3 mL Intravenous Q12H   Continuous Infusions: . sodium chloride    . sodium chloride    . sodium chloride    . sodium chloride    . sodium chloride    . [START ON 05/10/2017] ferric gluconate (FERRLECIT/NULECIT) IV       LOS: 4 days    Time spent: 25 minutes    Elmarie Shiley, MD [facility] Pager [phone]  If 7PM-7AM, please contact night-coverage [email or web address] Password [facility] 05/09/2017, 4:58 PM

## 2017-05-09 NOTE — Care Management Important Message (Signed)
Important Message  Patient Details  Name: [name] MRN: [id number] Date of Birth: 03/09/1946   Medicare Important Message Given:  Yes    Alethea Terhaar [address] 05/09/2017, 3:44 PM

## 2017-05-10 ENCOUNTER — Inpatient Hospital Stay (HOSPITAL_COMMUNITY): Payer: Medicare HMO

## 2017-05-10 LAB — BASIC METABOLIC PANEL
ANION GAP: 13 (ref 5–15)
BUN: 21 mg/dL — AB (ref 6–20)
CALCIUM: 8.3 mg/dL — AB (ref 8.9–10.3)
CO2: 23 mmol/L (ref 22–32)
Chloride: 105 mmol/L (ref 101–111)
Creatinine, Ser: 3.9 mg/dL — ABNORMAL HIGH (ref 0.44–1.00)
GFR calc Af Amer: 12 mL/min — ABNORMAL LOW (ref >60)
GFR, EST NON AFRICAN AMERICAN: 11 mL/min — AB (ref >60)
GLUCOSE: 142 mg/dL — AB (ref 65–99)
Potassium: 4.2 mmol/L (ref 3.5–5.1)
Sodium: 141 mmol/L (ref 135–145)

## 2017-05-10 MED ORDER — ZOLPIDEM TARTRATE 5 MG PO TABS
2.5000 mg | ORAL_TABLET | Freq: Once | ORAL | Status: AC
  Administered 2017-05-10: 2.5 mg via ORAL
  Filled 2017-05-10: qty 1

## 2017-05-10 MED ORDER — HYDRALAZINE HCL 25 MG PO TABS
25.0000 mg | ORAL_TABLET | Freq: Three times a day (TID) | ORAL | Status: DC
  Administered 2017-05-10 – 2017-05-11 (×3): 25 mg via ORAL
  Filled 2017-05-10 (×4): qty 1

## 2017-05-10 MED ORDER — KETOROLAC TROMETHAMINE 15 MG/ML IJ SOLN
15.0000 mg | Freq: Once | INTRAMUSCULAR | Status: AC
  Administered 2017-05-10: 15 mg via INTRAVENOUS
  Filled 2017-05-10: qty 1

## 2017-05-10 MED ORDER — HYDRALAZINE HCL 20 MG/ML IJ SOLN
5.0000 mg | Freq: Once | INTRAMUSCULAR | Status: AC
  Administered 2017-05-10: 5 mg via INTRAVENOUS
  Filled 2017-05-10: qty 1

## 2017-05-10 MED ORDER — TRAMADOL HCL 50 MG PO TABS
50.0000 mg | ORAL_TABLET | Freq: Two times a day (BID) | ORAL | Status: DC | PRN
  Administered 2017-05-10 – 2017-05-11 (×2): 50 mg via ORAL
  Filled 2017-05-10 (×2): qty 1

## 2017-05-10 MED ORDER — HYDRALAZINE HCL 25 MG PO TABS: 25.0000 mg | ORAL_TABLET | Freq: Three times a day (TID) | ORAL | Status: DC

## 2017-05-10 MED ORDER — FLUTICASONE PROPIONATE 50 MCG/ACT NA SUSP
1.0000 | Freq: Every day | NASAL | Status: DC
  Administered 2017-05-10 – 2017-05-12 (×3): 1 via NASAL
  Filled 2017-05-10: qty 16

## 2017-05-10 NOTE — Progress Notes (Signed)
[facility] Progress Note   Assessment/ Plan:    1.  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  CLIP in process.  Using AVG without issue.  Next planned HD tomorrow 1/18.  2.  Vol overload  Improving with HD  3.  HTN - BP still a little elevated.  On norvasc, atenolol, clonidine, and hydralazine started today.   4.  Anemia of CKD - hgb 9.0, improving.  Doing a course of IV iron and Aranesp 200 mcg q Tuesday  5.  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  On vit D but not requiring VDRA or binders yet  6.  Dispo: CLIP pending    Subjective:    Tolerating initiation of HD well.  For HD tomorrow.  Still in process with CLIP.   Objective:   BP (!) 180/81   Pulse (!) 114   Temp 98.5 F (36.9 C) (Oral)   Resp 17   Ht 5\' 5"  (1.651 m)   Wt 74.9 kg (165 lb 2 oz)   SpO2 97%   BMI 27.48 kg/m   Physical Exam: ZOX:WRUEA woman sitting in bed, comfortable CVS: RRR Resp: normal WOB, clear lungs Abd: no abd distention [id number] LE edema, improved ACCESS: LUE AVG +T/B  Labs: BMET Recent Labs  Lab 05/05/17 1813  05/06/17 0557 05/06/17 1126 05/06/17 2000 05/07/17 0422 05/07/17 0952 05/08/17 0418 05/09/17 0434 05/10/17 0441  NA 139  --  145  --   --  141 139 140 139 141  K 6.0*   < > 5.1 5.0  --  4.9 4.6 4.3 4.3 4.2  CL 112*  --  114*  --   --  111 109 106 104 105  CO2 17*  --  21*  --   --  20* 20* 23 24 23   GLUCOSE 102*  --  93  --   --  125* 220* 121* 121* 142*  BUN 64*  --  63*  --   --  59* 57* 40* 24* 21*  CREATININE 6.61*  --  6.81*  --   --  7.09* 7.08* 5.75* 4.39* 3.90*  CALCIUM 8.4*  --  8.7*  --   --  8.4* 8.4* 8.2* 8.6* 8.3*  PHOS  --   --   --   --  5.3*  --  5.3*  --   --   --    < > = values in this interval not displayed.   CBC Recent Labs  Lab 05/05/17 1813 05/07/17 0952 05/08/17 1729 05/09/17 0434  WBC 9.2 6.4 6.5 7.5  NEUTROABS  --   --   --  4.6  HGB 8.6* 7.3* 7.5* 9.0*  HCT 26.0* 22.6* 23.1* 26.9*  MCV 88.7 88.3 87.2 87.1  PLT 168 134* 155  139*    @IMGRELPRIORS @ Medications:    . amLODipine  10 mg Oral Daily  . aspirin  81 mg Oral Daily  . atenolol  25 mg Oral Daily  . cholecalciferol  5,000 Units Oral Daily  . cloNIDine  0.2 mg Oral BID  . clopidogrel  75 mg Oral Daily  . [START ON 05/15/2017] darbepoetin (ARANESP) injection - DIALYSIS  200 mcg Intravenous Q Tue-HD  . docusate sodium  100 mg Oral BID  . ezetimibe  10 mg Oral Daily  . heparin  5,000 Units Subcutaneous Q8H  . hydrALAZINE  25 mg Oral Q8H  . latanoprost  1 drop Both Eyes QHS  . PARoxetine  40 mg Oral  Daily  . pravastatin  40 mg Oral q1800  . senna  1 tablet Oral Daily  . sodium chloride flush  3 mL Intravenous Q12H     Madelon Lips, MD [facility] pgr [phone] 05/10/2017, 12:41 PM

## 2017-05-10 NOTE — Progress Notes (Signed)
Pt's blood pressure 180/76 (manual), post IV hydralazine 10mg .  Paged provider Tylene Fantasia, who ordered 5mg  hydralazine IV.  Will continue to monitor.

## 2017-05-10 NOTE — Progress Notes (Signed)
PROGRESS NOTE    [name]  YKD:[id number] DOB: 10/17/1945 DOA: 05/05/2017 PCP: [name], NP   Brief Narrative: Patient is a 11 old female with past medical history of CKD stage V with fistula but not on dialysis yet, anxiety, hypertension, chronic anemia who presents to the emergency department for the evaluation of shortness of breath.  Patient was found to have pulmonary edema secondary to volume overload.  She was also found to be hypertensive.  Chest x-ray showed mild interstitial edema, cardiomegaly, patchy consolidation in the bilateral bases with pneumonia not excluded.  The patient was started on  IV Lasix.Nephrology was consulted.  Patient has been  started on dialysis.  She is waiting for final arrangement of outpatient dialysis center.    Assessment & Plan:   Principal Problem:   CKD (chronic kidney disease) stage 5, GFR less than 15 ml/min (HCC) Active Problems:   Essential hypertension   Pulmonary edema, acute (HCC)   Acute respiratory failure with hypoxia (HCC)   Hyperkalemia   Anemia due to chronic kidney disease   Hypertensive urgency  1-CKD stage V / ESRD : Started on dialysis her.  First dialysis on 05/07/17 . She presented with hyperkalemia and metabolic acidosis. She follows with nephrology as an outpatient.  She has fistula on the left upper extremity. Was not  started on dialysis yet.  AV fistula was put about 2 years ago.   Presented with shortness of breath secondary to volume overload and hyperkalemia with hypertensive urgency. PTH elevated at 150. Dialysis started  as per nephrology.  Outpatient dialysis placement is in process. Awaiting clip.  HD per nephrologist   Hyperkalemia: Resolved  Acute pulmonary edema/hypoxic respiratory failure: Resolved.Presented with shortness of breath.  Now saturating normally on room air.  Respiratory status stable. Continue fluid restriction, daily output/input measuring  Possible HCAP: Suspected on  admission.Chest x-ray showed bibasilar opacities.  Patient is afebrile, no leukocytosis, no cough.  Discontinued antibiotics.  Hypertensive urgency:  We will continue clonidine, amlodipine, atenolol. BP elevated. Will start oral hydralazine.   Anemia:  Most likely associated  with chronic kidney disease.   Nephrology starting on ESA,IV iron.  Ferritin, iron normal. Follow trend   H/O CVA/Hyperlipidemia: On aspirin and Plavix at home.  Aspirin dose reduced to 81 mg daily from 325 mg.  Continue statin.  Headaches;  Started oral hydralazine.  Check CT head.    DVT prophylaxis:  heparin Code Status: Full Family Communication: None presented at the bedside Disposition Plan: Home after outpatient dialysis center establishment.   Consultants: Nephrology  Procedures: None  Antimicrobials: Was on vancomycin and Cefepime . Stopped now  Subjective: She is complaining of headaches. She gets headaches occasionally once in a while, but not this severe and persistent.    Objective: Vitals:   05/10/17 0217 05/10/17 0545 05/10/17 0859 05/10/17 1047  BP: (!) 177/69 (!) 179/84 (!) 178/87 (!) 180/81  Pulse:  (!) 59 (!) 114   Resp:  17    Temp:  98.5 F (36.9 C)    TempSrc:  Oral    SpO2:  97%    Weight:  74.9 kg (165 lb 2 oz)    Height:        Intake/Output Summary (Last 24 hours) at 05/10/2017 1329 Last data filed at 05/10/2017 1200 Gross per 24 hour  Intake 100 ml  Output 1000 ml  Net -900 ml   Filed Weights   05/09/17 1229 05/09/17 1613 05/10/17 0545  Weight: 77.2  kg (170 lb 3.1 oz) 73.2 kg (161 lb 6 oz) 74.9 kg (165 lb 2 oz)    Examination:  General exam: NAD Respiratory system: CTA Cardiovascular system: S 1, S 2 RRR Gastrointestinal system: BS present, soft, nt Central nervous system: non focal.  Extremities: , trace edemaAV fistula on the left arm     Data Reviewed: I have personally reviewed following labs and imaging studies  CBC: Recent Labs  Lab  05/05/17 1813 05/07/17 0952 05/08/17 1729 05/09/17 0434  WBC 9.2 6.4 6.5 7.5  NEUTROABS  --   --   --  4.6  HGB 8.6* 7.3* 7.5* 9.0*  HCT 26.0* 22.6* 23.1* 26.9*  MCV 88.7 88.3 87.2 87.1  PLT 168 134* 155 824*   Basic Metabolic Panel: Recent Labs  Lab 05/06/17 2000 05/07/17 0422 05/07/17 0952 05/08/17 0418 05/09/17 0434 05/10/17 0441  NA  --  141 139 140 139 141  K  --  4.9 4.6 4.3 4.3 4.2  CL  --  111 109 106 104 105  CO2  --  20* 20* 23 24 23   GLUCOSE  --  125* 220* 121* 121* 142*  BUN  --  59* 57* 40* 24* 21*  CREATININE  --  7.09* 7.08* 5.75* 4.39* 3.90*  CALCIUM  --  8.4* 8.4* 8.2* 8.6* 8.3*  PHOS 5.3*  --  5.3*  --   --   --    GFR: Estimated Creatinine Clearance: 13.4 mL/min (A) (by C-G formula based on SCr of 3.9 mg/dL (H)). Liver Function Tests: Recent Labs  Lab 05/07/17 0952  ALBUMIN 2.7*   No results for input(s): LIPASE, AMYLASE in the last 168 hours. No results for input(s): AMMONIA in the last 168 hours. Coagulation Profile: No results for input(s): INR, PROTIME in the last 168 hours. Cardiac Enzymes: No results for input(s): CKTOTAL, CKMB, CKMBINDEX, TROPONINI in the last 168 hours. BNP (last 3 results) No results for input(s): PROBNP in the last 8760 hours. HbA1C: No results for input(s): HGBA1C in the last 72 hours. CBG: Recent Labs  Lab 05/06/17 0001  GLUCAP 75   Lipid Profile: No results for input(s): CHOL, HDL, LDLCALC, TRIG, CHOLHDL, LDLDIRECT in the last 72 hours. Thyroid Function Tests: No results for input(s): TSH, T4TOTAL, FREET4, T3FREE, THYROIDAB in the last 72 hours. Anemia Panel: No results for input(s): VITAMINB12, FOLATE, FERRITIN, TIBC, IRON, RETICCTPCT in the last 72 hours. Sepsis Labs: Recent Labs  Lab 05/06/17 0557 05/07/17 0422  PROCALCITON <0.10 0.36    Recent Results (from the past 240 hour(s))  MRSA PCR Screening     Status: None   Collection Time: 05/05/17 11:59 PM  Result Value Ref Range Status   MRSA by  PCR NEGATIVE NEGATIVE Final    Comment:        The GeneXpert MRSA Assay (FDA approved for NASAL specimens only), is one component of a comprehensive MRSA colonization surveillance program. It is not intended to diagnose MRSA infection nor to guide or monitor treatment for MRSA infections.          Radiology Studies: Ct Head Wo Contrast  Result Date: 05/10/2017 CLINICAL DATA:  Chronic headache EXAM: CT HEAD WITHOUT CONTRAST TECHNIQUE: Contiguous axial images were obtained from the base of the skull through the vertex without intravenous contrast. COMPARISON:  January 01, 2017 FINDINGS: Brain: Mild diffuse atrophy is stable. There is no intracranial mass, hemorrhage, extra-axial fluid collection, or midline shift. There is evidence of a prior infarct in the mid  left parietal lobe. There is a prior infarct in the posterosuperior right parietal lobe which extends to the posterior superior right centrum semiovale, stable. Elsewhere, there is small vessel disease throughout the centra semiovale in a patchy distribution. There is small vessel disease in each internal capsule. There is a tiny lacunar infarct in the posterior right lentiform nucleus. No acute infarct evident. Vascular: No hyperdense vessel. There is calcification in each carotid siphon. Skull: Bony calvarium appears intact. Sinuses/Orbits: There is mucosal thickening in several ethmoid air cells. There is mild mucosal thickening in each anterior sphenoid sinus. Other visualized paranasal sinuses are clear. Orbits appear symmetric bilaterally. Other: Mastoid air cells are clear. IMPRESSION: Stable atrophy. Prior infarcts in each parietal lobe. Patchy supratentorial small vessel disease elsewhere is stable. No acute infarct evident. No mass or hemorrhage. There are foci of arterial vascular calcification. There are foci of paranasal sinus disease. Electronically Signed   By: Lowella Grip III M.D.   On: 05/10/2017 13:02         Scheduled Meds: . amLODipine  10 mg Oral Daily  . aspirin  81 mg Oral Daily  . atenolol  25 mg Oral Daily  . cholecalciferol  5,000 Units Oral Daily  . cloNIDine  0.2 mg Oral BID  . clopidogrel  75 mg Oral Daily  . [START ON 05/15/2017] darbepoetin (ARANESP) injection - DIALYSIS  200 mcg Intravenous Q Tue-HD  . docusate sodium  100 mg Oral BID  . ezetimibe  10 mg Oral Daily  . heparin  5,000 Units Subcutaneous Q8H  . hydrALAZINE  25 mg Oral Q8H  . latanoprost  1 drop Both Eyes QHS  . PARoxetine  40 mg Oral Daily  . pravastatin  40 mg Oral q1800  . senna  1 tablet Oral Daily  . sodium chloride flush  3 mL Intravenous Q12H   Continuous Infusions: . sodium chloride    . sodium chloride    . sodium chloride    . sodium chloride    . sodium chloride    . ferric gluconate (FERRLECIT/NULECIT) IV       LOS: 5 days    Time spent: 25 minutes    Elmarie Shiley, MD [facility] Pager [phone]  If 7PM-7AM, please contact night-coverage [email or web address] Password [email or web address] 05/10/2017, 1:29 PM

## 2017-05-11 LAB — CBC
HCT: 27.5 % — ABNORMAL LOW (ref 36.0–46.0)
Hemoglobin: 8.9 g/dL — ABNORMAL LOW (ref 12.0–15.0)
MCH: 28.6 pg (ref 26.0–34.0)
MCHC: 32.4 g/dL (ref 30.0–36.0)
MCV: 88.4 fL (ref 78.0–100.0)
PLATELETS: 173 10*3/uL (ref 150–400)
RBC: 3.11 MIL/uL — ABNORMAL LOW (ref 3.87–5.11)
RDW: 15.4 % (ref 11.5–15.5)
WBC: 6.6 10*3/uL (ref 4.0–10.5)

## 2017-05-11 LAB — RENAL FUNCTION PANEL
ALBUMIN: 3.1 g/dL — AB (ref 3.5–5.0)
Anion gap: 11 (ref 5–15)
BUN: 24 mg/dL — AB (ref 6–20)
CO2: 22 mmol/L (ref 22–32)
Calcium: 8.7 mg/dL — ABNORMAL LOW (ref 8.9–10.3)
Chloride: 106 mmol/L (ref 101–111)
Creatinine, Ser: 5.39 mg/dL — ABNORMAL HIGH (ref 0.44–1.00)
GFR calc Af Amer: 8 mL/min — ABNORMAL LOW (ref >60)
GFR calc non Af Amer: 7 mL/min — ABNORMAL LOW (ref >60)
GLUCOSE: 186 mg/dL — AB (ref 65–99)
PHOSPHORUS: 4.4 mg/dL (ref 2.5–4.6)
Potassium: 3.9 mmol/L (ref 3.5–5.1)
SODIUM: 139 mmol/L (ref 135–145)

## 2017-05-11 MED ORDER — LABETALOL HCL 5 MG/ML IV SOLN
10.0000 mg | Freq: Once | INTRAVENOUS | Status: AC
  Administered 2017-05-11: 10 mg via INTRAVENOUS
  Filled 2017-05-11: qty 4

## 2017-05-11 MED ORDER — HYDRALAZINE HCL 20 MG/ML IJ SOLN
20.0000 mg | Freq: Once | INTRAMUSCULAR | Status: AC
  Administered 2017-05-11: 20 mg via INTRAVENOUS
  Filled 2017-05-11: qty 1

## 2017-05-11 MED ORDER — HYDRALAZINE HCL 50 MG PO TABS
50.0000 mg | ORAL_TABLET | Freq: Three times a day (TID) | ORAL | Status: DC
  Administered 2017-05-11 – 2017-05-12 (×3): 50 mg via ORAL
  Filled 2017-05-11 (×2): qty 1

## 2017-05-11 MED ORDER — CALCITRIOL 0.25 MCG PO CAPS: 0.2500 ug | ORAL_CAPSULE | ORAL | Status: DC

## 2017-05-11 NOTE — Progress Notes (Signed)
Recheck of BP at 0125 after administration of labetalol now 190/78 manual.  Paged provider C. Bodenheimer, who ordered 20mg  hydralazine IV.  Will continue to monitor.

## 2017-05-11 NOTE — Progress Notes (Signed)
Accepted at [facility] . 1st treatment Tuesday January 22,2019 at 11:00am .Schedule and chairtime Tuesday,Thursday,Saturday at 11:45 .2nd shift

## 2017-05-11 NOTE — Progress Notes (Signed)
BP recheck after 20mg  hydralazine:  159/75.  Will continue to monitor closely.

## 2017-05-11 NOTE — Progress Notes (Signed)
Pt found to have BP 177/72, post 10mg  IV hydralazine.  Paged provider C. Bodenheimer, who ordered labetalol 10mg  IV.  Will continue to monitor.

## 2017-05-11 NOTE — Progress Notes (Signed)
PROGRESS NOTE    [name]  WUX:[id number] DOB: 27-Apr-1945 DOA: 05/05/2017 PCP: [name], NP   Brief Narrative: Patient is a 20 old female with past medical history of CKD stage V with fistula but not on dialysis yet, anxiety, hypertension, chronic anemia who presents to the emergency department for the evaluation of shortness of breath.  Patient was found to have pulmonary edema secondary to volume overload.  She was also found to be hypertensive.  Chest x-ray showed mild interstitial edema, cardiomegaly, patchy consolidation in the bilateral bases with pneumonia not excluded.  The patient was started on  IV Lasix.Nephrology was consulted.  Patient has been  started on dialysis.  She is waiting for final arrangement of outpatient dialysis center.    Assessment & Plan:   Principal Problem:   CKD (chronic kidney disease) stage 5, GFR less than 15 ml/min (HCC) Active Problems:   Essential hypertension   Pulmonary edema, acute (HCC)   Acute respiratory failure with hypoxia (HCC)   Hyperkalemia   Anemia due to chronic kidney disease   Hypertensive urgency  1-CKD stage V / ESRD : Started on dialysis her.  First dialysis on 05/07/17 . She presented with hyperkalemia and metabolic acidosis. She follows with nephrology as an outpatient.  She has fistula on the left upper extremity. Was not  started on dialysis yet.  AV fistula was put about 2 years ago.   Presented with shortness of breath secondary to volume overload and hyperkalemia with hypertensive urgency. PTH elevated at 150. Dialysis started  as per nephrology.  Outpatient dialysis placement is in process. Accepted at [facility], to start Tuesday. Needs HD tomorrow Saturday  HD per nephrologist   Hyperkalemia: Resolved  Acute pulmonary edema/hypoxic respiratory failure: Resolved.Presented with shortness of breath.  Now saturating normally on room air.  Respiratory status stable. Continue fluid restriction,  daily output/input measuring  Possible HCAP: Suspected on admission.Chest x-ray showed bibasilar opacities.  Patient is afebrile, no leukocytosis, no cough.  Discontinued antibiotics.  Hypertensive urgency:  We will continue clonidine, amlodipine, atenolol. BP elevated. Started on oral hydralazine.   Anemia:  Most likely associated  with chronic kidney disease.   Nephrology starting on ESA,IV iron.  Ferritin, iron normal. Follow trend   H/O CVA/Hyperlipidemia: On aspirin and Plavix at home.  Aspirin dose reduced to 81 mg daily from 325 mg.  Continue statin.  Headaches;  Started oral hydralazine.  CT head negative for acute finding.  resolved   DVT prophylaxis: [place] heparin Code Status: Full Family Communication: None presented at the bedside Disposition Plan: Home after outpatient dialysis center establishment.   Consultants: Nephrology  Procedures: None  Antimicrobials: Was on vancomycin and Cefepime . Stopped now  Subjective: Seen during HD.  denies headaches, she wants to finished HD tx,    Objective: Vitals:   05/11/17 1230 05/11/17 1300 05/11/17 1330 05/11/17 1336  BP: 132/79 (!) 157/66 (!) 162/79 (!) 175/84  Pulse: 81 68 70 70  Resp: 20 18 (!) 24 20  Temp:      TempSrc:      SpO2:    99%  Weight:    71.1 kg (156 lb 12 oz)  Height:        Intake/Output Summary (Last 24 hours) at 05/11/2017 1422 Last data filed at 05/11/2017 1336 Gross per 24 hour  Intake 3 ml  Output 1031 ml  Net -1028 ml   Filed Weights   05/11/17 0616 05/11/17 0924 05/11/17 1336  Weight: 72.8 kg (160 lb 7.9 oz) 72.6 kg (160 lb 0.9 oz) 71.1 kg (156 lb 12 oz)    Examination:  General exam: NAD Respiratory system: CTA Cardiovascular system: S 1, S 2 RRR Gastrointestinal system: BS present, soft, nt Central nervous system: non focal.  Extremities: , trace edemaAV fistula on the left arm     Data Reviewed: I have personally reviewed following labs and imaging  studies  CBC: Recent Labs  Lab 05/05/17 1813 05/07/17 0952 05/08/17 1729 05/09/17 0434 05/11/17 0933  WBC 9.2 6.4 6.5 7.5 6.6  NEUTROABS  --   --   --  4.6  --   HGB 8.6* 7.3* 7.5* 9.0* 8.9*  HCT 26.0* 22.6* 23.1* 26.9* 27.5*  MCV 88.7 88.3 87.2 87.1 88.4  PLT 168 134* 155 139* 245   Basic Metabolic Panel: Recent Labs  Lab 05/06/17 2000  05/07/17 0952 05/08/17 0418 05/09/17 0434 05/10/17 0441 05/11/17 0933  NA  --    < > 139 140 139 141 139  K  --    < > 4.6 4.3 4.3 4.2 3.9  CL  --    < > 109 106 104 105 106  CO2  --    < > 20* 23 24 23 22   GLUCOSE  --    < > 220* 121* 121* 142* 186*  BUN  --    < > 57* 40* 24* 21* 24*  CREATININE  --    < > 7.08* 5.75* 4.39* 3.90* 5.39*  CALCIUM  --    < > 8.4* 8.2* 8.6* 8.3* 8.7*  PHOS 5.3*  --  5.3*  --   --   --  4.4   < > = values in this interval not displayed.   GFR: Estimated Creatinine Clearance: 9.5 mL/min (A) (by C-G formula based on SCr of 5.39 mg/dL (H)). Liver Function Tests: Recent Labs  Lab 05/07/17 0952 05/11/17 0933  ALBUMIN 2.7* 3.1*   No results for input(s): LIPASE, AMYLASE in the last 168 hours. No results for input(s): AMMONIA in the last 168 hours. Coagulation Profile: No results for input(s): INR, PROTIME in the last 168 hours. Cardiac Enzymes: No results for input(s): CKTOTAL, CKMB, CKMBINDEX, TROPONINI in the last 168 hours. BNP (last 3 results) No results for input(s): PROBNP in the last 8760 hours. HbA1C: No results for input(s): HGBA1C in the last 72 hours. CBG: Recent Labs  Lab 05/06/17 0001  GLUCAP 75   Lipid Profile: No results for input(s): CHOL, HDL, LDLCALC, TRIG, CHOLHDL, LDLDIRECT in the last 72 hours. Thyroid Function Tests: No results for input(s): TSH, T4TOTAL, FREET4, T3FREE, THYROIDAB in the last 72 hours. Anemia Panel: No results for input(s): VITAMINB12, FOLATE, FERRITIN, TIBC, IRON, RETICCTPCT in the last 72 hours. Sepsis Labs: Recent Labs  Lab 05/06/17 0557  05/07/17 0422  PROCALCITON <0.10 0.36    Recent Results (from the past 240 hour(s))  MRSA PCR Screening     Status: None   Collection Time: 05/05/17 11:59 PM  Result Value Ref Range Status   MRSA by PCR NEGATIVE NEGATIVE Final    Comment:        The GeneXpert MRSA Assay (FDA approved for NASAL specimens only), is one component of a comprehensive MRSA colonization surveillance program. It is not intended to diagnose MRSA infection nor to guide or monitor treatment for MRSA infections.          Radiology Studies: Ct Head Wo Contrast  Result Date: 05/10/2017 CLINICAL DATA:  Chronic headache EXAM: CT HEAD WITHOUT CONTRAST TECHNIQUE: Contiguous axial images were obtained from the base of the skull through the vertex without intravenous contrast. COMPARISON:  January 01, 2017 FINDINGS: Brain: Mild diffuse atrophy is stable. There is no intracranial mass, hemorrhage, extra-axial fluid collection, or midline shift. There is evidence of a prior infarct in the mid left parietal lobe. There is a prior infarct in the posterosuperior right parietal lobe which extends to the posterior superior right centrum semiovale, stable. Elsewhere, there is small vessel disease throughout the centra semiovale in a patchy distribution. There is small vessel disease in each internal capsule. There is a tiny lacunar infarct in the posterior right lentiform nucleus. No acute infarct evident. Vascular: No hyperdense vessel. There is calcification in each carotid siphon. Skull: Bony calvarium appears intact. Sinuses/Orbits: There is mucosal thickening in several ethmoid air cells. There is mild mucosal thickening in each anterior sphenoid sinus. Other visualized paranasal sinuses are clear. Orbits appear symmetric bilaterally. Other: Mastoid air cells are clear. IMPRESSION: Stable atrophy. Prior infarcts in each parietal lobe. Patchy supratentorial small vessel disease elsewhere is stable. No acute infarct evident.  No mass or hemorrhage. There are foci of arterial vascular calcification. There are foci of paranasal sinus disease. Electronically Signed   By: Lowella Grip III M.D.   On: 05/10/2017 13:02        Scheduled Meds: . amLODipine  10 mg Oral Daily  . aspirin  81 mg Oral Daily  . atenolol  25 mg Oral Daily  . [START ON 05/12/2017] calcitRIOL  0.25 mcg Oral Q T,Th,Sa-HD  . cholecalciferol  5,000 Units Oral Daily  . cloNIDine  0.2 mg Oral BID  . clopidogrel  75 mg Oral Daily  . [START ON 05/15/2017] darbepoetin (ARANESP) injection - DIALYSIS  200 mcg Intravenous Q Tue-HD  . docusate sodium  100 mg Oral BID  . ezetimibe  10 mg Oral Daily  . fluticasone  1 spray Each Nare Daily  . heparin  5,000 Units Subcutaneous Q8H  . hydrALAZINE  50 mg Oral Q8H  . latanoprost  1 drop Both Eyes QHS  . PARoxetine  40 mg Oral Daily  . pravastatin  40 mg Oral q1800  . senna  1 tablet Oral Daily  . sodium chloride flush  3 mL Intravenous Q12H   Continuous Infusions: . sodium chloride    . sodium chloride    . sodium chloride    . sodium chloride    . sodium chloride    . ferric gluconate (FERRLECIT/NULECIT) IV 125 mg (05/11/17 1320)     LOS: 6 days    Time spent: 25 minutes    Elmarie Shiley, MD [facility] Pager [phone]  If 7PM-7AM, please contact night-coverage [email or web address] Password [email or web address] 05/11/2017, 2:22 PM

## 2017-05-11 NOTE — Progress Notes (Signed)
[facility] Progress Note   Assessment/ Plan:    1.  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  CLIP'd to [employer], can start Tuesday.  Will do short HD tomorrow for first shift and after that can discharge.    2.  Vol overload  Improving with HD  3.  HTN - BP still a little elevated.  On norvasc, atenolol, clonidine, and hydralazine uptitrated today.  4.  Anemia of CKD - hgb 9.0, improving.  Doing a course of IV iron and Aranesp 200 mcg q Tuesday  5.  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  On vit D, will start low-dose calcitriol with HD (0.25 mcg TIW)  6.  Dispo: OK for d/c after short HD tomorrow.    Subjective:    HD #4 today.  Doing OK.  Having some cramps in the belly.  CLIP'd   Objective:   BP 132/79 (BP Location: Right Arm)   Pulse 81   Temp 98.7 F (37.1 C) (Oral)   Resp 20   Ht 5\' 5"  (1.651 m)   Wt 72.6 kg (160 lb 0.9 oz)   SpO2 98%   BMI 26.63 kg/m   Physical Exam: INO:MVEHM woman sitting in bed, comfortable CVS: RRR Resp: normal WOB, clear lungs Abd: no abd distention CNO:BSJGG LE edema, improved ACCESS: LUE AVG +T/B  Labs: BMET Recent Labs  Lab 05/06/17 0557 05/06/17 1126 05/06/17 2000 05/07/17 0422 05/07/17 8366 05/08/17 0418 05/09/17 0434 05/10/17 0441 05/11/17 0933  NA 145  --   --  141 139 140 139 141 139  K 5.1 5.0  --  4.9 4.6 4.3 4.3 4.2 3.9  CL 114*  --   --  111 109 106 104 105 106  CO2 21*  --   --  20* 20* 23 24 23 22   GLUCOSE 93  --   --  125* 220* 121* 121* 142* 186*  BUN 63*  --   --  59* 57* 40* 24* 21* 24*  CREATININE 6.81*  --   --  7.09* 7.08* 5.75* 4.39* 3.90* 5.39*  CALCIUM 8.7*  --   --  8.4* 8.4* 8.2* 8.6* 8.3* 8.7*  PHOS  --   --  5.3*  --  5.3*  --   --   --  4.4   CBC Recent Labs  Lab 05/07/17 0952 05/08/17 1729 05/09/17 0434 05/11/17 0933  WBC 6.4 6.5 7.5 6.6  NEUTROABS  --   --  4.6  --   HGB 7.3* 7.5* 9.0* 8.9*  HCT 22.6* 23.1* 26.9* 27.5*  MCV 88.3 87.2 87.1 88.4  PLT 134* 155 139* 173     @IMGRELPRIORS @ Medications:    . amLODipine  10 mg Oral Daily  . aspirin  81 mg Oral Daily  . atenolol  25 mg Oral Daily  . cholecalciferol  5,000 Units Oral Daily  . cloNIDine  0.2 mg Oral BID  . clopidogrel  75 mg Oral Daily  . [START ON 05/15/2017] darbepoetin (ARANESP) injection - DIALYSIS  200 mcg Intravenous Q Tue-HD  . docusate sodium  100 mg Oral BID  . ezetimibe  10 mg Oral Daily  . fluticasone  1 spray Each Nare Daily  . heparin  5,000 Units Subcutaneous Q8H  . hydrALAZINE  50 mg Oral Q8H  . latanoprost  1 drop Both Eyes QHS  . PARoxetine  40 mg Oral Daily  . pravastatin  40 mg Oral q1800  . senna  1 tablet Oral  Daily  . sodium chloride flush  3 mL Intravenous Q12H     Madelon Lips, MD [facility] pgr [phone] 05/11/2017, 1:09 PM

## 2017-05-11 NOTE — Progress Notes (Signed)
Tolerated HD trmt fairly well.  Mild nausea and abd cramping approx 3 hrs into trmt.  D/W Dr. Hollie Salk.  UF off for last hour of trmt.

## 2017-05-12 MED ORDER — ASPIRIN 81 MG PO CHEW: 81.0000 mg | CHEWABLE_TABLET | Freq: Every day | ORAL | 0 refills | Status: AC

## 2017-05-12 MED ORDER — HYDRALAZINE HCL 50 MG PO TABS: 50.0000 mg | ORAL_TABLET | Freq: Three times a day (TID) | ORAL | 0 refills | Status: DC

## 2017-05-12 MED ORDER — CALCITRIOL 0.25 MCG PO CAPS: 0.2500 ug | ORAL_CAPSULE | ORAL | 0 refills | Status: DC

## 2017-05-12 NOTE — Progress Notes (Signed)
Patient tolerated treatment well.  Education provided using teach back to review what to take and wear at outpt dialysis, importance of treatment adherence, fluid restrictions, and daily care of AVG.  Patient verbalized understanding.  Will need reinforcement outpt.

## 2017-05-12 NOTE — Progress Notes (Signed)
[facility] Progress Note   Assessment/ Plan:    1.  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 LUE AVG doing well.  2K bath. Have been doing 16 g needles but probably could move to 15g as output.  No hep with HD (hasn't required any, no contraindication).  CLIP'd to [employer], can start Tuesday.  Short HD today and then OK for d/c.  2.  Vol overload  Improving with HD.  Post-weight today 68.5 kg 11/19.  3.  HTN - BP still a little elevated.  On norvasc, atenolol, clonidine, and hydralazine uptitrated today.  4.  Anemia of CKD - hgb 9.0, improving.  Doing a course of IV iron and Aranesp 200 mcg q Tuesday  5.  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  On vit D, will start low-dose calcitriol with HD (0.25 mcg TIW)  6.  Dispo: d/c today   Subjective:    Short HD treatment today and then OK for d/c.  BP s are better with vol removal and uptitration of meds.   Objective:   BP (!) 155/74 (BP Location: Right Arm)   Pulse 62   Temp (!) 97.1 F (36.2 C) (Oral)   Resp 16   Ht 5\' 5"  (1.651 m)   Wt 68.3 kg (150 lb 9.2 oz) Comment: 68.3  SpO2 99%   BMI 25.06 kg/m   Physical Exam: Gen: sitting in dialysis, NAD CVS: RRR Resp: normal WOB, clear lungs Abd: no abd distention Ext: no LE edema ACCESS: LUE AVG +T/B  Labs: BMET Recent Labs  Lab 05/06/17 0557 05/06/17 1126 05/06/17 2000 05/07/17 0422 05/07/17 6644 05/08/17 0418 05/09/17 0434 05/10/17 0441 05/11/17 0933  NA 145  --   --  141 139 140 139 141 139  K 5.1 5.0  --  4.9 4.6 4.3 4.3 4.2 3.9  CL 114*  --   --  111 109 106 104 105 106  CO2 21*  --   --  20* 20* 23 24 23 22   GLUCOSE 93  --   --  125* 220* 121* 121* 142* 186*  BUN 63*  --   --  59* 57* 40* 24* 21* 24*  CREATININE 6.81*  --   --  7.09* 7.08* 5.75* 4.39* 3.90* 5.39*  CALCIUM 8.7*  --   --  8.4* 8.4* 8.2* 8.6* 8.3* 8.7*  PHOS  --   --  5.3*  --  5.3*  --   --   --  4.4   CBC Recent Labs  Lab 05/07/17 0952 05/08/17 1729 05/09/17 0434 05/11/17 0933   WBC 6.4 6.5 7.5 6.6  NEUTROABS  --   --  4.6  --   HGB 7.3* 7.5* 9.0* 8.9*  HCT 22.6* 23.1* 26.9* 27.5*  MCV 88.3 87.2 87.1 88.4  PLT 134* 155 139* 173    @IMGRELPRIORS @ Medications:    . amLODipine  10 mg Oral Daily  . aspirin  81 mg Oral Daily  . atenolol  25 mg Oral Daily  . calcitRIOL  0.25 mcg Oral Q T,Th,Sa-HD  . cholecalciferol  5,000 Units Oral Daily  . cloNIDine  0.2 mg Oral BID  . clopidogrel  75 mg Oral Daily  . [START ON 05/15/2017] darbepoetin (ARANESP) injection - DIALYSIS  200 mcg Intravenous Q Tue-HD  . docusate sodium  100 mg Oral BID  . ezetimibe  10 mg Oral Daily  . fluticasone  1 spray Each Nare Daily  . heparin  5,000 Units Subcutaneous  Q8H  . hydrALAZINE  50 mg Oral Q8H  . latanoprost  1 drop Both Eyes QHS  . PARoxetine  40 mg Oral Daily  . pravastatin  40 mg Oral q1800  . senna  1 tablet Oral Daily  . sodium chloride flush  3 mL Intravenous Q12H     Madelon Lips, MD [facility] pgr [phone] 05/12/2017, 10:29 AM

## 2017-05-12 NOTE — Progress Notes (Signed)
Pt given discharge instructions, prescriptions, and care notes. Pt verbalized understanding AEB no further questions or concerns at this time. IV was discontinued, no redness, pain, or swelling noted at this time. Telemetry discontinued and Centralized Telemetry was notified. Pt left the floor via wheelchair with staff in stable condition. 

## 2017-05-12 NOTE — Discharge Summary (Signed)
Physician Discharge Summary  [name] GGY:[id number] DOB: April 13, 1946 DOA: 05/05/2017  PCP: [name], NP  Admit date: 05/05/2017 Discharge date: 05/12/2017  Admitted From: Home  Disposition:  Home   Recommendations for Outpatient Follow-up:  1. Follow up with PCP in 1-2 weeks 2. Please obtain BMP/CBC in one week 3. Follow up for out patient HD    Discharge Condition: stable.  CODE STATUS: full code.  Diet recommendation: Heart Healthy   Brief/Interim Summary: Brief Narrative: Patient is a 13 old female with past medical history of CKD stage V with fistula but not on dialysis yet, anxiety, hypertension, chronic anemia who presents to the emergency department for the evaluation of shortness of breath.  Patient was found to have pulmonary edema secondary to volume overload.  She was also found to be hypertensive.  Chest x-ray showed mild interstitial edema, cardiomegaly, patchy consolidation in the bilateral bases with pneumonia not excluded.  The patient was started on  IV Lasix.Nephrology was consulted.  Patient has been  started on dialysis.  She is waiting for final arrangement of outpatient dialysis center.    Assessment & Plan:   Principal Problem:   CKD (chronic kidney disease) stage 5, GFR less than 15 ml/min (HCC) Active Problems:   Essential hypertension   Pulmonary edema, acute (HCC)   Acute respiratory failure with hypoxia (HCC)   Hyperkalemia   Anemia due to chronic kidney disease   Hypertensive urgency  1-CKD stage V / ESRD : Started on dialysis her.  First dialysis on 05/07/17 . She presented with hyperkalemia and metabolic acidosis. She follows with nephrology as an outpatient.  She has fistula on the left upper extremity. Was not  started on dialysis yet.  AV fistula was put about 2 years ago.   Presented with shortness of breath secondary to volume overload and hyperkalemia with hypertensive urgency. PTH elevated at 150. Dialysis started  as  per nephrology.  Outpatient dialysis placement is in process. Accepted at [place]  [facility], to start Tuesday. Discharge today after HD.  HD per nephrologist   Hyperkalemia: Resolved  Acute pulmonary edema/hypoxic respiratory failure: Resolved.Presented with shortness of breath.  Now saturating normally on room air.  Respiratory status stable. Continue fluid restriction, daily output/input measuring  Possible HCAP: Suspected on admission.Chest x-ray showed bibasilar opacities.  Patient is afebrile, no leukocytosis, no cough.  Discontinued antibiotics.  Hypertensive urgency:  We will continue clonidine, amlodipine, atenolol. BP elevated. Started on oral hydralazine.   Anemia:  Most likely associated  with chronic kidney disease.   Nephrology starting on ESA,IV iron.  Ferritin, iron normal. Follow trend   H/O CVA/Hyperlipidemia: On aspirin and Plavix at home.  Aspirin dose reduced to 81 mg daily from 325 mg.  Continue statin.  Headaches;  Started oral hydralazine.  CT head negative for acute finding.  resolved     Discharge Diagnoses:  Principal Problem:   CKD (chronic kidney disease) stage 5, GFR less than 15 ml/min (HCC) Active Problems:   Essential hypertension   Pulmonary edema, acute (HCC)   Acute respiratory failure with hypoxia (HCC)   Hyperkalemia   Anemia due to chronic kidney disease   Hypertensive urgency    Discharge Instructions  Discharge Instructions    Diet - low sodium heart healthy   Complete by:  As directed    Increase activity slowly   Complete by:  As directed      Allergies as of 05/12/2017      Reactions  Lisinopril Other (See Comments)   Abnormal Kidney Function    Pioglitazone Other (See Comments)   REACTION: Desquamation of skin of the palm   Morphine And Related Nausea And Vomiting   Sulfonamide Derivatives Rash      Medication List    STOP taking these medications   aspirin EC 325 MG tablet Replaced by:  aspirin 81  MG chewable tablet   furosemide 40 MG tablet Commonly known as:  LASIX   sodium bicarbonate 650 MG tablet     TAKE these medications   ALPRAZolam 0.5 MG tablet Commonly known as:  XANAX Take one tablet by mouth twice daily as needed for anxiety   amLODipine 10 MG tablet Commonly known as:  NORVASC Take 1 tablet (10 mg total) by mouth daily.   aspirin 81 MG chewable tablet Chew 1 tablet (81 mg total) by mouth daily. Replaces:  aspirin EC 325 MG tablet   atenolol 25 MG tablet Commonly known as:  TENORMIN Take 1 tablet (25 mg total) by mouth daily.   calcitRIOL 0.25 MCG capsule Commonly known as:  ROCALTROL Take 1 capsule (0.25 mcg total) by mouth Every Tuesday,Thursday,and Saturday with dialysis.   cloNIDine 0.2 MG tablet Commonly known as:  CATAPRES Take 1 tablet (0.2 mg total) by mouth 2 (two) times daily.   clopidogrel 75 MG tablet Commonly known as:  PLAVIX Take 1 tablet (75 mg total) by mouth daily.   ezetimibe 10 MG tablet Commonly known as:  ZETIA Take 1 tablet (10 mg total) by mouth daily.   hydrALAZINE 50 MG tablet Commonly known as:  APRESOLINE Take 1 tablet (50 mg total) by mouth every 8 (eight) hours.   latanoprost 0.005 % ophthalmic solution Commonly known as:  XALATAN Place 1 drop into both eyes at bedtime. Reported on 08/27/2015   omeprazole 40 MG capsule Commonly known as:  PRILOSEC Take 1 capsule (40 mg total) by mouth daily.   PARoxetine 40 MG tablet Commonly known as:  PAXIL Take 1 tablet (40 mg total) by mouth daily.   pravastatin 40 MG tablet Commonly known as:  PRAVACHOL Take 1 tablet (40 mg total) by mouth at bedtime.   SUMAtriptan 25 MG tablet Commonly known as:  IMITREX 1 tablet daily as needed for headache, May repeat in 2 hours if headache persists or recurs. Max of 2 tablets in 24 hours What changed:    how much to take  how to take this  when to take this  additional instructions   triamcinolone cream 0.1 % Commonly  known as:  KENALOG Apply 1 application topically 3 (three) times daily as needed (rash).   Vitamin D3 5000 units Tabs Take 1 tablet (5,000 Units total) by mouth daily.       Allergies  Allergen Reactions  . Lisinopril Other (See Comments)    Abnormal Kidney Function   . Pioglitazone Other (See Comments)    REACTION: Desquamation of skin of the palm  . Morphine And Related Nausea And Vomiting  . Sulfonamide Derivatives Rash    Consultations:  Nephrology    Procedures/Studies: Dg Chest 2 View  Result Date: 05/05/2017 CLINICAL DATA:  Shortness of breath and cough this morning EXAM: CHEST  2 VIEW COMPARISON:  March 14, 2017 FINDINGS: The heart size and mediastinal contours are stable. The heart size is enlarged. Patchy consolidation of bilateral lung bases are noted. There is a small right pleural effusion. There is mild interstitial edema. The visualized skeletal structures are stable. IMPRESSION: Patchy  consolidation of bilateral lung bases pneumonia is not excluded. Small right pleural effusion. Mild interstitial edema.  Cardiomegaly. Electronically Signed   By: Abelardo Diesel M.D.   On: 05/05/2017 19:48   Ct Head Wo Contrast  Result Date: 05/10/2017 CLINICAL DATA:  Chronic headache EXAM: CT HEAD WITHOUT CONTRAST TECHNIQUE: Contiguous axial images were obtained from the base of the skull through the vertex without intravenous contrast. COMPARISON:  January 01, 2017 FINDINGS: Brain: Mild diffuse atrophy is stable. There is no intracranial mass, hemorrhage, extra-axial fluid collection, or midline shift. There is evidence of a prior infarct in the mid left parietal lobe. There is a prior infarct in the posterosuperior right parietal lobe which extends to the posterior superior right centrum semiovale, stable. Elsewhere, there is small vessel disease throughout the centra semiovale in a patchy distribution. There is small vessel disease in each internal capsule. There is a tiny lacunar  infarct in the posterior right lentiform nucleus. No acute infarct evident. Vascular: No hyperdense vessel. There is calcification in each carotid siphon. Skull: Bony calvarium appears intact. Sinuses/Orbits: There is mucosal thickening in several ethmoid air cells. There is mild mucosal thickening in each anterior sphenoid sinus. Other visualized paranasal sinuses are clear. Orbits appear symmetric bilaterally. Other: Mastoid air cells are clear. IMPRESSION: Stable atrophy. Prior infarcts in each parietal lobe. Patchy supratentorial small vessel disease elsewhere is stable. No acute infarct evident. No mass or hemorrhage. There are foci of arterial vascular calcification. There are foci of paranasal sinus disease. Electronically Signed   By: Lowella Grip III M.D.   On: 05/10/2017 13:02    (Echo, Carotid, EGD, Colonoscopy, ERCP)    Subjective:   Discharge Exam: Vitals:   05/12/17 0800 05/12/17 0830  BP: (!) 156/72 (!) 155/74  Pulse: (!) 58 62  Resp: 18 16  Temp:    SpO2:     Vitals:   05/12/17 0730 05/12/17 0800 05/12/17 0830 05/12/17 0855  BP: 139/82 (!) 156/72 (!) 155/74   Pulse: 67 (!) 58 62   Resp: 15 18 16    Temp:      TempSrc:      SpO2:      Weight:    68.3 kg (150 lb 9.2 oz)  Height:        General: Pt is alert, awake, not in acute distress Cardiovascular: RRR, S1/S2 +, no rubs, no gallops Respiratory: CTA bilaterally, no wheezing, no rhonchi Abdominal: Soft, NT, ND, bowel sounds + Extremities: no edema, no cyanosis    The results of significant diagnostics from this hospitalization (including imaging, microbiology, ancillary and laboratory) are listed below for reference.     Microbiology: Recent Results (from the past 240 hour(s))  MRSA PCR Screening     Status: None   Collection Time: 05/05/17 11:59 PM  Result Value Ref Range Status   MRSA by PCR NEGATIVE NEGATIVE Final    Comment:        The GeneXpert MRSA Assay (FDA approved for NASAL  specimens only), is one component of a comprehensive MRSA colonization surveillance program. It is not intended to diagnose MRSA infection nor to guide or monitor treatment for MRSA infections.      Labs: BNP (last 3 results) No results for input(s): BNP in the last 8760 hours. Basic Metabolic Panel: Recent Labs  Lab 05/06/17 2000  05/07/17 0952 05/08/17 0418 05/09/17 0434 05/10/17 0441 05/11/17 0933  NA  --    < > 139 140 139 141 139  K  --    < >  4.6 4.3 4.3 4.2 3.9  CL  --    < > 109 106 104 105 106  CO2  --    < > 20* 23 24 23 22   GLUCOSE  --    < > 220* 121* 121* 142* 186*  BUN  --    < > 57* 40* 24* 21* 24*  CREATININE  --    < > 7.08* 5.75* 4.39* 3.90* 5.39*  CALCIUM  --    < > 8.4* 8.2* 8.6* 8.3* 8.7*  PHOS 5.3*  --  5.3*  --   --   --  4.4   < > = values in this interval not displayed.   Liver Function Tests: Recent Labs  Lab 05/07/17 0952 05/11/17 0933  ALBUMIN 2.7* 3.1*   No results for input(s): LIPASE, AMYLASE in the last 168 hours. No results for input(s): AMMONIA in the last 168 hours. CBC: Recent Labs  Lab 05/05/17 1813 05/07/17 0952 05/08/17 1729 05/09/17 0434 05/11/17 0933  WBC 9.2 6.4 6.5 7.5 6.6  NEUTROABS  --   --   --  4.6  --   HGB 8.6* 7.3* 7.5* 9.0* 8.9*  HCT 26.0* 22.6* 23.1* 26.9* 27.5*  MCV 88.7 88.3 87.2 87.1 88.4  PLT 168 134* 155 139* 173   Cardiac Enzymes: No results for input(s): CKTOTAL, CKMB, CKMBINDEX, TROPONINI in the last 168 hours. BNP: Invalid input(s): POCBNP CBG: Recent Labs  Lab 05/06/17 0001  GLUCAP 75   D-Dimer No results for input(s): DDIMER in the last 72 hours. Hgb A1c No results for input(s): HGBA1C in the last 72 hours. Lipid Profile No results for input(s): CHOL, HDL, LDLCALC, TRIG, CHOLHDL, LDLDIRECT in the last 72 hours. Thyroid function studies No results for input(s): TSH, T4TOTAL, T3FREE, THYROIDAB in the last 72 hours.  Invalid input(s): FREET3 Anemia work up No results for  input(s): VITAMINB12, FOLATE, FERRITIN, TIBC, IRON, RETICCTPCT in the last 72 hours. Urinalysis    Component Value Date/Time   COLORURINE YELLOW 11/21/2012 1438   APPEARANCEUR CLEAR 11/21/2012 1438   LABSPEC 1.027 11/21/2012 1438   PHURINE 5.0 11/21/2012 1438   GLUCOSEU NEG 11/21/2012 1438   HGBUR NEG 11/21/2012 1438   BILIRUBINUR SMALL (A) 11/21/2012 1438   KETONESUR TRACE (A) 11/21/2012 1438   PROTEINUR > 300 (A) 11/21/2012 1438   UROBILINOGEN 0.2 11/21/2012 1438   NITRITE NEG 11/21/2012 1438   LEUKOCYTESUR MOD (A) 11/21/2012 1438   Sepsis Labs Invalid input(s): PROCALCITONIN,  WBC,  LACTICIDVEN Microbiology Recent Results (from the past 240 hour(s))  MRSA PCR Screening     Status: None   Collection Time: 05/05/17 11:59 PM  Result Value Ref Range Status   MRSA by PCR NEGATIVE NEGATIVE Final    Comment:        The GeneXpert MRSA Assay (FDA approved for NASAL specimens only), is one component of a comprehensive MRSA colonization surveillance program. It is not intended to diagnose MRSA infection nor to guide or monitor treatment for MRSA infections.      Time coordinating discharge: Over 30 minutes  SIGNED:   Elmarie Shiley, MD  [facility] 05/12/2017, 10:16 AM Pager   If 7PM-7AM, please contact night-coverage [email or web address] Password [email or web address]

## 2017-05-12 NOTE — Procedures (Signed)
Patient seen and examined on Hemodialysis this morning. QB 400 mL/ min via LUE AVG, UF goal 1L.  Doing OK.  Eager for d/c today.  Treatment adjusted as needed.  Madelon Lips MD [facility] pgr [phone] 10:38 AM

## 2017-05-14 ENCOUNTER — Telehealth: Payer: Self-pay

## 2017-05-14 NOTE — Telephone Encounter (Signed)
Transition Care Management Follow-Up Telephone Call   Date discharged and where: [facility] on 05/12/2017  How have you been since you were released from the hospital? Breathing has been fine. Still going to dialysis T/Th/Sat   Any patient concerns? None  Items Reviewed:   Meds: Y  Allergies: Y  Dietary Changes Reviewed: Y  Functional Questionnaire:  Independent-I Dependent-D  ADLs:   Dressing- I    Eating- I   Maintaining continence- I   Transfeirring- I w/ cane   Transportation-D   Meal Prep- D   Managing Meds- I w/ assist  Confirmed importance and Date/Time of follow-up visits scheduled:yes, [name], NP 06/21/2017 @ 1pm   Confirmed with patient if condition worsens to call PCP or go to the Emergency Dept. Patient was given office number and encouraged to call back with questions or concerns: Yes

## 2017-05-15 DIAGNOSIS — N186 End stage renal disease: Secondary | ICD-10-CM | POA: Diagnosis not present

## 2017-05-15 DIAGNOSIS — N2581 Secondary hyperparathyroidism of renal origin: Secondary | ICD-10-CM | POA: Diagnosis not present

## 2017-05-17 DIAGNOSIS — N186 End stage renal disease: Secondary | ICD-10-CM | POA: Diagnosis not present

## 2017-05-17 DIAGNOSIS — N2581 Secondary hyperparathyroidism of renal origin: Secondary | ICD-10-CM | POA: Diagnosis not present

## 2017-05-19 DIAGNOSIS — N2581 Secondary hyperparathyroidism of renal origin: Secondary | ICD-10-CM | POA: Diagnosis not present

## 2017-05-19 DIAGNOSIS — N186 End stage renal disease: Secondary | ICD-10-CM | POA: Diagnosis not present

## 2017-05-21 ENCOUNTER — Other Ambulatory Visit: Payer: Self-pay

## 2017-05-21 DIAGNOSIS — R202 Paresthesia of skin: Secondary | ICD-10-CM

## 2017-05-22 DIAGNOSIS — N2581 Secondary hyperparathyroidism of renal origin: Secondary | ICD-10-CM | POA: Diagnosis not present

## 2017-05-22 DIAGNOSIS — N186 End stage renal disease: Secondary | ICD-10-CM | POA: Diagnosis not present

## 2017-05-24 ENCOUNTER — Ambulatory Visit: Payer: Commercial Managed Care - HMO | Admitting: Neurology

## 2017-05-24 DIAGNOSIS — E1129 Type 2 diabetes mellitus with other diabetic kidney complication: Secondary | ICD-10-CM | POA: Diagnosis not present

## 2017-05-24 DIAGNOSIS — Z992 Dependence on renal dialysis: Secondary | ICD-10-CM | POA: Diagnosis not present

## 2017-05-24 DIAGNOSIS — N186 End stage renal disease: Secondary | ICD-10-CM | POA: Diagnosis not present

## 2017-05-24 DIAGNOSIS — N2581 Secondary hyperparathyroidism of renal origin: Secondary | ICD-10-CM | POA: Diagnosis not present

## 2017-05-25 DIAGNOSIS — Z992 Dependence on renal dialysis: Secondary | ICD-10-CM | POA: Diagnosis not present

## 2017-05-25 DIAGNOSIS — E1129 Type 2 diabetes mellitus with other diabetic kidney complication: Secondary | ICD-10-CM | POA: Diagnosis not present

## 2017-05-25 DIAGNOSIS — N186 End stage renal disease: Secondary | ICD-10-CM | POA: Diagnosis not present

## 2017-05-26 DIAGNOSIS — N186 End stage renal disease: Secondary | ICD-10-CM | POA: Diagnosis not present

## 2017-05-26 DIAGNOSIS — N2581 Secondary hyperparathyroidism of renal origin: Secondary | ICD-10-CM | POA: Diagnosis not present

## 2017-05-29 DIAGNOSIS — N2581 Secondary hyperparathyroidism of renal origin: Secondary | ICD-10-CM | POA: Diagnosis not present

## 2017-05-29 DIAGNOSIS — N186 End stage renal disease: Secondary | ICD-10-CM | POA: Diagnosis not present

## 2017-05-31 DIAGNOSIS — N186 End stage renal disease: Secondary | ICD-10-CM | POA: Diagnosis not present

## 2017-05-31 DIAGNOSIS — N2581 Secondary hyperparathyroidism of renal origin: Secondary | ICD-10-CM | POA: Diagnosis not present

## 2017-06-02 DIAGNOSIS — N2581 Secondary hyperparathyroidism of renal origin: Secondary | ICD-10-CM | POA: Diagnosis not present

## 2017-06-02 DIAGNOSIS — N186 End stage renal disease: Secondary | ICD-10-CM | POA: Diagnosis not present

## 2017-06-04 ENCOUNTER — Other Ambulatory Visit: Payer: Self-pay | Admitting: Nurse Practitioner

## 2017-06-04 ENCOUNTER — Other Ambulatory Visit: Payer: Self-pay | Admitting: Pharmacist

## 2017-06-04 ENCOUNTER — Telehealth: Payer: Self-pay

## 2017-06-04 DIAGNOSIS — R110 Nausea: Secondary | ICD-10-CM

## 2017-06-04 MED ORDER — ONDANSETRON HCL 4 MG PO TABS: ORAL_TABLET | ORAL | 0 refills | Status: DC

## 2017-06-04 NOTE — Patient Outreach (Signed)
[facility]) Care Management  [facility]   06/04/2017  MAHRI ECKHOLM 1945-12-14 [id number]  Subjective: Mrs. [name] is a pleasant 72 y/o female who was recently discharged from [facility] after starting dialysis. She reports that she gets her medicines filled at [facility], where they help fill her pill-pack so she is not as familiar with the individual drugs she takes but takes everything in her pill-pack. The only issue she reports is significant nausea/vomiting with dialysis sessions, usually occurring towards the end of the session.  Objective:  Encounter Medications: Outpatient Encounter Medications as of 06/04/2017  Medication Sig Note  . ALPRAZolam (XANAX) 0.5 MG tablet Take one tablet by mouth twice daily as needed for anxiety (Patient not taking: Reported on 05/05/2017) 05/05/2017: #60 filled 03/26/17 [facility] - pt states she is out of this medication  . amLODipine (NORVASC) 10 MG tablet Take 1 tablet (10 mg total) by mouth daily.   Marland Kitchen aspirin 81 MG chewable tablet Chew 1 tablet (81 mg total) by mouth daily.   Marland Kitchen atenolol (TENORMIN) 25 MG tablet Take 1 tablet (25 mg total) by mouth daily.   . calcitRIOL (ROCALTROL) 0.25 MCG capsule Take 1 capsule (0.25 mcg total) by mouth Every Tuesday,Thursday,and Saturday with dialysis.   . Cholecalciferol (VITAMIN D3) 5000 units TABS Take 1 tablet (5,000 Units total) by mouth daily.   . cloNIDine (CATAPRES) 0.2 MG tablet Take 1 tablet (0.2 mg total) by mouth 2 (two) times daily.   . clopidogrel (PLAVIX) 75 MG tablet Take 1 tablet (75 mg total) by mouth daily.   Marland Kitchen ezetimibe (ZETIA) 10 MG tablet Take 1 tablet (10 mg total) by mouth daily.   . hydrALAZINE (APRESOLINE) 50 MG tablet Take 1 tablet (50 mg total) by mouth every 8 (eight) hours.   Marland Kitchen latanoprost (XALATAN) 0.005 % ophthalmic solution Place 1 drop into both eyes at bedtime. Reported on 08/27/2015   . omeprazole (PRILOSEC) 40 MG capsule  Take 1 capsule (40 mg total) by mouth daily. (Patient not taking: Reported on 05/05/2017)   . PARoxetine (PAXIL) 40 MG tablet Take 1 tablet (40 mg total) by mouth daily.   . pravastatin (PRAVACHOL) 40 MG tablet Take 1 tablet (40 mg total) by mouth at bedtime.   . SUMAtriptan (IMITREX) 25 MG tablet 1 tablet daily as needed for headache, May repeat in 2 hours if headache persists or recurs. Max of 2 tablets in 24 hours (Patient taking differently: Take 25 mg by mouth See admin instructions. Take one tablet (25 mg) by mouth as needed for headache, May repeat in 2 hours if headache persists or recurs. Max of 2 tablets in 24 hours)   . triamcinolone cream (KENALOG) 0.1 % Apply 1 application topically 3 (three) times daily as needed (rash).     No facility-administered encounter medications on file as of 06/04/2017.     ASSESSMENT: Date Discharged from Hospital: 05/12/2017 Date Medication Reconciliation Performed: 06/04/2017  Medications Discontinued at Discharge:  Aspirin 325 mg   Sodium bicarb  Furosemide  New Medications at Discharge:   Aspirin 81 mg  Hydralazine 50 mg q8 hrs  Calcitriol 0.25 mcg with dialysis   Patient was recently discharged from hospital and all medications have been reviewed.   Drugs sorted by system:  Neurologic/Psychologic: - paroxetine 40 mg - sumatriptan 25 mg (has not used recently) - alprazolam (discontinued - patient no longer takes)  Cardiovascular: - amlodipine 10 mg daily - aspirin 81 mg daily -  atenolol 25 mg daily - clonidine 0.2 mg daily - clopidogrel 75 mg daily - hydralazine 50 mg q8 hours - pravastatin 40 mg daily - ezetimibe 10 mg daily  Gastrointestinal: - omeprazole 40 mg (takes only PRN, has not used recently)  Renal: - calcitriol 0.25 mcg with dialysis (TTS)  Topical: - triamcinolone cream TID prn - latanoprost eye drops HS  Vitamins/Minerals: - vitamin D 5000 units daily  Duplications in therapy: many hypertension  meds, she reports her SBP at home is usually between 120-140 Gaps in therapy: no antiemetic  Medications to avoid in the elderly: alprazolam (discontinued, no longer takes) Drug interactions: no significant interactions Other issues noted: significant nausea/vomiting with dialysis   PLAN: -Instructed patient to take new medications as prescribed and discontinue old medications as prescribed  - Will let PCP know about N/V with dialysis, recommend PRN antiemetic (ondansetron 4 mg with dialysis)   Al Corpus, PharmD PGY1 Pharmacy Resident Email: [email or web address] Pager: [phone]

## 2017-06-04 NOTE — Progress Notes (Unsigned)
Pharmacy consult revealed Nausea with dialysis, Rx sent to pharmacy for ondansetron 4 mg daily with dialysis to help with nausea

## 2017-06-04 NOTE — Telephone Encounter (Signed)
Per Janett Billow: Pharmacy consult revealed Nausea with dialysis, Rx sent to pharmacy for ondansetron 4 mg daily with dialysis to help with nausea.    Tried to call patient but there was no answer. Will call again later.

## 2017-06-05 DIAGNOSIS — N186 End stage renal disease: Secondary | ICD-10-CM | POA: Diagnosis not present

## 2017-06-05 DIAGNOSIS — N2581 Secondary hyperparathyroidism of renal origin: Secondary | ICD-10-CM | POA: Diagnosis not present

## 2017-06-07 DIAGNOSIS — N2581 Secondary hyperparathyroidism of renal origin: Secondary | ICD-10-CM | POA: Diagnosis not present

## 2017-06-07 DIAGNOSIS — N186 End stage renal disease: Secondary | ICD-10-CM | POA: Diagnosis not present

## 2017-06-09 DIAGNOSIS — N2581 Secondary hyperparathyroidism of renal origin: Secondary | ICD-10-CM | POA: Diagnosis not present

## 2017-06-09 DIAGNOSIS — N186 End stage renal disease: Secondary | ICD-10-CM | POA: Diagnosis not present

## 2017-06-12 DIAGNOSIS — N186 End stage renal disease: Secondary | ICD-10-CM | POA: Diagnosis not present

## 2017-06-12 DIAGNOSIS — N2581 Secondary hyperparathyroidism of renal origin: Secondary | ICD-10-CM | POA: Diagnosis not present

## 2017-06-13 ENCOUNTER — Other Ambulatory Visit: Payer: Self-pay | Admitting: Nurse Practitioner

## 2017-06-13 DIAGNOSIS — I639 Cerebral infarction, unspecified: Secondary | ICD-10-CM

## 2017-06-13 DIAGNOSIS — F418 Other specified anxiety disorders: Secondary | ICD-10-CM

## 2017-06-14 DIAGNOSIS — N2581 Secondary hyperparathyroidism of renal origin: Secondary | ICD-10-CM | POA: Diagnosis not present

## 2017-06-14 DIAGNOSIS — N186 End stage renal disease: Secondary | ICD-10-CM | POA: Diagnosis not present

## 2017-06-16 DIAGNOSIS — N2581 Secondary hyperparathyroidism of renal origin: Secondary | ICD-10-CM | POA: Diagnosis not present

## 2017-06-16 DIAGNOSIS — N186 End stage renal disease: Secondary | ICD-10-CM | POA: Diagnosis not present

## 2017-06-19 DIAGNOSIS — N2581 Secondary hyperparathyroidism of renal origin: Secondary | ICD-10-CM | POA: Diagnosis not present

## 2017-06-19 DIAGNOSIS — N186 End stage renal disease: Secondary | ICD-10-CM | POA: Diagnosis not present

## 2017-06-21 ENCOUNTER — Ambulatory Visit: Payer: Medicare HMO | Admitting: Nurse Practitioner

## 2017-06-21 DIAGNOSIS — N186 End stage renal disease: Secondary | ICD-10-CM | POA: Diagnosis not present

## 2017-06-21 DIAGNOSIS — N2581 Secondary hyperparathyroidism of renal origin: Secondary | ICD-10-CM | POA: Diagnosis not present

## 2017-06-22 DIAGNOSIS — N186 End stage renal disease: Secondary | ICD-10-CM | POA: Diagnosis not present

## 2017-06-22 DIAGNOSIS — Z992 Dependence on renal dialysis: Secondary | ICD-10-CM | POA: Diagnosis not present

## 2017-06-22 DIAGNOSIS — E1129 Type 2 diabetes mellitus with other diabetic kidney complication: Secondary | ICD-10-CM | POA: Diagnosis not present

## 2017-06-23 DIAGNOSIS — N2581 Secondary hyperparathyroidism of renal origin: Secondary | ICD-10-CM | POA: Diagnosis not present

## 2017-06-23 DIAGNOSIS — E8770 Fluid overload, unspecified: Secondary | ICD-10-CM | POA: Diagnosis not present

## 2017-06-23 DIAGNOSIS — N186 End stage renal disease: Secondary | ICD-10-CM | POA: Diagnosis not present

## 2017-06-25 ENCOUNTER — Ambulatory Visit (HOSPITAL_COMMUNITY)
Admission: RE | Admit: 2017-06-25 | Discharge: 2017-06-25 | Disposition: A | Discharge status: Home / Self Care | Payer: Medicare HMO | Source: Ambulatory Visit | Attending: Vascular Surgery | Admitting: Vascular Surgery

## 2017-06-25 ENCOUNTER — Other Ambulatory Visit: Payer: Self-pay

## 2017-06-25 ENCOUNTER — Ambulatory Visit: Payer: Medicare HMO | Admitting: Vascular Surgery

## 2017-06-25 ENCOUNTER — Encounter: Payer: Self-pay | Admitting: Vascular Surgery

## 2017-06-25 VITALS — BP 147/94 | HR 99 | Temp 97.9°F | Resp 16 | Ht 65.0 in | Wt 163.0 lb

## 2017-06-25 DIAGNOSIS — M79609 Pain in unspecified limb: Secondary | ICD-10-CM | POA: Insufficient documentation

## 2017-06-25 DIAGNOSIS — Z992 Dependence on renal dialysis: Secondary | ICD-10-CM | POA: Diagnosis not present

## 2017-06-25 DIAGNOSIS — N186 End stage renal disease: Secondary | ICD-10-CM | POA: Diagnosis not present

## 2017-06-25 DIAGNOSIS — M7989 Other specified soft tissue disorders: Secondary | ICD-10-CM | POA: Insufficient documentation

## 2017-06-25 DIAGNOSIS — R202 Paresthesia of skin: Secondary | ICD-10-CM | POA: Diagnosis not present

## 2017-06-25 NOTE — Progress Notes (Signed)
Patient is a 72 year old female sent for evaluation of numbness and tingling in her left fifth finger when on dialysis.  She had a left upper arm graft placed October 2018.  She states that the left finger gets numb and stays that way for about 2 hours into dialysis and then improved.  She has no symptoms at all in her left hand when she is not on dialysis.  Review of systems: She denies fever chills shortness of breath or chest pain.  Current Outpatient Medications on File Prior to Visit  Medication Sig Dispense Refill  . amLODipine (NORVASC) 10 MG tablet Take 1 tablet (10 mg total) by mouth daily. 30 tablet 6  . aspirin 81 MG chewable tablet Chew 1 tablet (81 mg total) by mouth daily. 30 tablet 0  . atenolol (TENORMIN) 25 MG tablet Take 1 tablet (25 mg total) by mouth daily. 30 tablet 6  . calcitRIOL (ROCALTROL) 0.25 MCG capsule Take 1 capsule (0.25 mcg total) by mouth Every Tuesday,Thursday,and Saturday with dialysis. 90 capsule 0  . cloNIDine (CATAPRES) 0.2 MG tablet Take 1 tablet (0.2 mg total) by mouth 2 (two) times daily. 60 tablet 6  . clopidogrel (PLAVIX) 75 MG tablet Take 1 tablet (75 mg total) by mouth daily. 30 tablet 3  . ezetimibe (ZETIA) 10 MG tablet Take 1 tablet (10 mg total) by mouth daily. 30 tablet 3  . hydrALAZINE (APRESOLINE) 50 MG tablet Take 1 tablet (50 mg total) by mouth every 8 (eight) hours. 90 tablet 0  . latanoprost (XALATAN) 0.005 % ophthalmic solution Place 1 drop into both eyes at bedtime. Reported on 08/27/2015    . NATURAL VITAMIN D-3 5000 units TABS Take 1 tablet (5,000 Units total) by mouth daily. 30 tablet 3  . omeprazole (PRILOSEC) 40 MG capsule Take 1 capsule (40 mg total) by mouth daily. (Patient not taking: Reported on 05/05/2017) 90 capsule 1  . ondansetron (ZOFRAN) 4 MG tablet On dialysis days as needed for nausea 20 tablet 0  . PARoxetine (PAXIL) 40 MG tablet Take 1 tablet (40 mg total) by mouth daily. 30 tablet 3  . pravastatin (PRAVACHOL) 40 MG tablet  Take 1 tablet (40 mg total) by mouth at bedtime. 90 tablet 1  . SUMAtriptan (IMITREX) 25 MG tablet 1 tablet daily as needed for headache, May repeat in 2 hours if headache persists or recurs. Max of 2 tablets in 24 hours (Patient not taking: Reported on 06/04/2017) 30 tablet 0  . triamcinolone cream (KENALOG) 0.1 % Apply 1 application topically 3 (three) times daily as needed (rash).   0   No current facility-administered medications on file prior to visit.     Past Medical History:  Diagnosis Date  . Anemia   . Anxiety   . Arthritis    lower back and knees   . Asthma    childhood  . Barrett's esophagus   . Chronic kidney disease   . Depression   . Diabetes mellitus   . Dysphagia   . Eczema   . History of herpes zoster 02/2010   Recovered fully after period of acute herpetic neuralgia tx w/ gabapentin.   Marland Kitchen Hx MRSA infection 2009  . Hyperlipidemia   . Hypertension   . Neuromuscular disorder (HCC)    chronic pain  . Personal history of colonic polyps 10/17/2010   hyperplastic    Past Surgical History:  Procedure Laterality Date  . ABDOMINAL HYSTERECTOMY     unclear when  . AV FISTULA PLACEMENT  Left 02/13/2017   Procedure: ARTERIOVENOUS (AV) GORE-TEX STRETCH GRAFT INSERTION INTO LEFT ARM;  Surgeon: Elam Dutch, MD;  Location: [facility];  Service: Vascular;  Laterality: Left;  . CATARACT EXTRACTION Bilateral   . thigh surg     left side due to MRSA    Physical exam:  Vitals:   06/25/17 1139  BP: (!) 147/94  Pulse: 99  Resp: 16  Temp: 97.9 F (36.6 C)  TempSrc: Oral  SpO2: 100%  Weight: 163 lb (73.9 kg)  Height: 5\' 5"  (1.651 m)    Left upper extremity: Palpable thrill audible bruit left upper arm AV graft.  No erythema no drainage no ulceration, 2+ left radial pulse  Data: Patient had a steal study done in our vascular lab today.  This showed no evidence of steal in her left upper extremity.  Assessment: Patient does have some symptoms of steal when on  dialysis in her left fifth digit.  This does improve about 2 hours into the session.  When she is not on dialysis she has no symptoms.  I would consider her current symptoms moderate in nature.  I would not consider an intervention at this point for her current level of symptoms.  She will try to wear a glove on her left hand during dialysis to see if her symptoms improve with this.  If she begins to have worsening symptoms that are present when not on dialysis or breakdown of skin we will consider other options.  Otherwise she will follow-up on an as-needed basis.  Plan: See above  Ruta Hinds, MD [facility] Office: [phone] Pager: [phone]

## 2017-06-26 DIAGNOSIS — E8770 Fluid overload, unspecified: Secondary | ICD-10-CM | POA: Diagnosis not present

## 2017-06-26 DIAGNOSIS — N2581 Secondary hyperparathyroidism of renal origin: Secondary | ICD-10-CM | POA: Diagnosis not present

## 2017-06-26 DIAGNOSIS — N186 End stage renal disease: Secondary | ICD-10-CM | POA: Diagnosis not present

## 2017-06-28 DIAGNOSIS — N186 End stage renal disease: Secondary | ICD-10-CM | POA: Diagnosis not present

## 2017-06-28 DIAGNOSIS — E8770 Fluid overload, unspecified: Secondary | ICD-10-CM | POA: Diagnosis not present

## 2017-06-28 DIAGNOSIS — N2581 Secondary hyperparathyroidism of renal origin: Secondary | ICD-10-CM | POA: Diagnosis not present

## 2017-06-29 ENCOUNTER — Ambulatory Visit
Admission: RE | Admit: 2017-06-29 | Discharge: 2017-06-29 | Disposition: A | Discharge status: Home / Self Care | Payer: Medicare HMO | Source: Ambulatory Visit | Attending: Nurse Practitioner | Admitting: Nurse Practitioner

## 2017-06-29 ENCOUNTER — Encounter: Payer: Self-pay | Admitting: Nurse Practitioner

## 2017-06-29 ENCOUNTER — Ambulatory Visit: Payer: Medicare HMO | Admitting: Nurse Practitioner

## 2017-06-29 VITALS — BP 152/94 | HR 97 | Temp 97.9°F | Ht 65.0 in | Wt 160.0 lb

## 2017-06-29 DIAGNOSIS — E785 Hyperlipidemia, unspecified: Secondary | ICD-10-CM

## 2017-06-29 DIAGNOSIS — I10 Essential (primary) hypertension: Secondary | ICD-10-CM | POA: Diagnosis not present

## 2017-06-29 DIAGNOSIS — S299XXA Unspecified injury of thorax, initial encounter: Secondary | ICD-10-CM | POA: Diagnosis not present

## 2017-06-29 DIAGNOSIS — N186 End stage renal disease: Secondary | ICD-10-CM | POA: Diagnosis not present

## 2017-06-29 DIAGNOSIS — Z992 Dependence on renal dialysis: Secondary | ICD-10-CM | POA: Diagnosis not present

## 2017-06-29 DIAGNOSIS — K219 Gastro-esophageal reflux disease without esophagitis: Secondary | ICD-10-CM

## 2017-06-29 DIAGNOSIS — G43009 Migraine without aura, not intractable, without status migrainosus: Secondary | ICD-10-CM | POA: Diagnosis not present

## 2017-06-29 DIAGNOSIS — Z720 Tobacco use: Secondary | ICD-10-CM | POA: Diagnosis not present

## 2017-06-29 DIAGNOSIS — E1149 Type 2 diabetes mellitus with other diabetic neurological complication: Secondary | ICD-10-CM | POA: Diagnosis not present

## 2017-06-29 DIAGNOSIS — Z794 Long term (current) use of insulin: Secondary | ICD-10-CM

## 2017-06-29 DIAGNOSIS — R0781 Pleurodynia: Secondary | ICD-10-CM

## 2017-06-29 DIAGNOSIS — I639 Cerebral infarction, unspecified: Secondary | ICD-10-CM

## 2017-06-29 DIAGNOSIS — F418 Other specified anxiety disorders: Secondary | ICD-10-CM

## 2017-06-29 MED ORDER — PAROXETINE HCL 40 MG PO TABS: 40.0000 mg | ORAL_TABLET | Freq: Every day | ORAL | 1 refills | Status: DC

## 2017-06-29 MED ORDER — AMLODIPINE BESYLATE 5 MG PO TABS: 5.0000 mg | ORAL_TABLET | Freq: Every day | ORAL | 1 refills | Status: DC

## 2017-06-29 MED ORDER — CHOLECALCIFEROL 125 MCG (5000 UT) PO TABS: ORAL_TABLET | ORAL | 1 refills | Status: DC

## 2017-06-29 MED ORDER — PANTOPRAZOLE SODIUM 40 MG PO TBEC: 40.0000 mg | DELAYED_RELEASE_TABLET | Freq: Every day | ORAL | 3 refills | Status: DC

## 2017-06-29 MED ORDER — PRAVASTATIN SODIUM 40 MG PO TABS: 40.0000 mg | ORAL_TABLET | Freq: Every day | ORAL | 1 refills | Status: DC

## 2017-06-29 MED ORDER — CLOPIDOGREL BISULFATE 75 MG PO TABS: 75.0000 mg | ORAL_TABLET | Freq: Every day | ORAL | 1 refills | Status: DC

## 2017-06-29 NOTE — Progress Notes (Signed)
Careteam: Patient Care Team: [name], NP as PCP - General (Nurse Practitioner) [name], MD as Consulting Physician (Ophthalmology) [name], MD as Consulting Physician (Nephrology) [name], NP as Nurse Practitioner (Nurse Practitioner)  Advanced Directive information    Allergies  Allergen Reactions  . Lisinopril Other (See Comments)    Abnormal Kidney Function   . Pioglitazone Other (See Comments)    REACTION: Desquamation of skin of the palm  . Morphine And Related Nausea And Vomiting  . Sulfonamide Derivatives Rash    Chief Complaint  Patient presents with  . Medical Management of Chronic Issues    Pt is being seen for a 3 month routine visit.   Tawni Pummel    Brother, Iona Beard, in room. Iona Beard is helping pt with her medications for last 3 months.      HPI: Patient is a 72 y.o. female seen in the office today for routine follow up.  Pt with hx of CKD stage V with fistula, anxiety, hypertension, chronic anemia. She was hospitalized in late January due to CKD with pulmonary edema due to volume overload also with hyperkalemia and hypertensive urgency and was started in HD at that time. Currently going to HD on Tuesday, Thursday, and Saturday.   Lot of confusion with medication, nephrologist has adjusted and wrote out detail list for brother and pt.   htn- she was continues on clonidine, amlodipine, atenolol and started on hydralazine during hospitalization however clonidine and hydralazine stopped per HD and lasix 40 mg added on non HD days not taking atenolol.   Hyperlipidemia- currently on pravastatin and zetia, last LDL 151 in 10/18  GERD- controlled on omeprazole.   Anxiety/depression- currently on paxil, overall mood has improved  No recent headaches   Having pain on left side under arm, reports she lost her balance and feel backwards. Pain started then. States pain is not getting any better, abt the same Taking tylenol but  does not feel like it helps that much, muscle rubs have been helping when she uses them.  Review of Systems:  Review of Systems  Constitutional: Negative for chills, fever and weight loss.  HENT: Negative for tinnitus.   Respiratory: Negative for cough, sputum production and shortness of breath.   Cardiovascular: Negative for chest pain, palpitations and leg swelling.  Gastrointestinal: Negative for abdominal pain, constipation, diarrhea and heartburn.  Genitourinary: Negative for dysuria, frequency and urgency.  Musculoskeletal: Negative for back pain, falls, joint pain and myalgias.  Skin: Negative.   Neurological: Negative for dizziness and headaches.  Psychiatric/Behavioral: Positive for depression and memory loss. Negative for suicidal ideas. The patient is nervous/anxious and has insomnia.        Overall mood has been better    Past Medical History:  Diagnosis Date  . Anemia   . Anxiety   . Arthritis    lower back and knees   . Asthma    childhood  . Barrett's esophagus   . Chronic kidney disease   . Depression   . Diabetes mellitus   . Dysphagia   . Eczema   . History of herpes zoster 02/2010   Recovered fully after period of acute herpetic neuralgia tx w/ gabapentin.   Marland Kitchen Hx MRSA infection 2009  . Hyperlipidemia   . Hypertension   . Neuromuscular disorder (HCC)    chronic pain  . Personal history of colonic polyps 10/17/2010   hyperplastic   Past Surgical History:  Procedure Laterality Date  .  ABDOMINAL HYSTERECTOMY     unclear when  . AV FISTULA PLACEMENT Left 02/13/2017   Procedure: ARTERIOVENOUS (AV) GORE-TEX STRETCH GRAFT INSERTION INTO LEFT ARM;  Surgeon: Elam Dutch, MD;  Location: [facility];  Service: Vascular;  Laterality: Left;  . CATARACT EXTRACTION Bilateral   . thigh surg     left side due to MRSA   Social History:   reports that she has been smoking cigarettes.  She has a 40.00 pack-year smoking history. she has never used smokeless tobacco. She  reports that she drinks about 1.2 oz of alcohol per week. She reports that she does not use drugs.  Family History  Problem Relation Age of Onset  . Hypertension Father   . Kidney disease Father   . Diabetes Brother   . Colon cancer Neg Hx   . CAD Neg Hx     Medications: Patient's Medications  New Prescriptions   No medications on file  Previous Medications   AMLODIPINE (NORVASC) 5 MG TABLET    Take 5 mg by mouth daily.   ASPIRIN 81 MG CHEWABLE TABLET    Chew 1 tablet (81 mg total) by mouth daily.   ATENOLOL (TENORMIN) 25 MG TABLET    Take 1 tablet (25 mg total) by mouth daily.   CALCITRIOL (ROCALTROL) 0.25 MCG CAPSULE    Take 1 capsule (0.25 mcg total) by mouth Every Tuesday,Thursday,and Saturday with dialysis.   CLOPIDOGREL (PLAVIX) 75 MG TABLET    Take 1 tablet (75 mg total) by mouth daily.   EZETIMIBE (ZETIA) 10 MG TABLET    Take 1 tablet (10 mg total) by mouth daily.   FUROSEMIDE (LASIX) 40 MG TABLET    Take 1 tablet (40 mg) by mouth on Sunday, Monday, Wednesday, and Friday (non-dialysis days).   LATANOPROST (XALATAN) 0.005 % OPHTHALMIC SOLUTION    Place 1 drop into both eyes at bedtime. Reported on 08/27/2015   NATURAL VITAMIN D-3 5000 UNITS TABS    Take 1 tablet (5,000 Units total) by mouth daily.   OMEPRAZOLE (PRILOSEC) 40 MG CAPSULE    Take 1 capsule (40 mg total) by mouth daily.   ONDANSETRON (ZOFRAN) 4 MG TABLET    On dialysis days as needed for nausea   PAROXETINE (PAXIL) 40 MG TABLET    Take 1 tablet (40 mg total) by mouth daily.   PRAVASTATIN (PRAVACHOL) 40 MG TABLET    Take 1 tablet (40 mg total) by mouth at bedtime.   SUMATRIPTAN (IMITREX) 25 MG TABLET    1 tablet daily as needed for headache, May repeat in 2 hours if headache persists or recurs. Max of 2 tablets in 24 hours   TRIAMCINOLONE CREAM (KENALOG) 0.1 %    Apply 1 application topically 3 (three) times daily as needed (rash).   Modified Medications   No medications on file  Discontinued Medications   AMLODIPINE  (NORVASC) 10 MG TABLET    Take 1 tablet (10 mg total) by mouth daily.   CLONIDINE (CATAPRES) 0.2 MG TABLET    Take 1 tablet (0.2 mg total) by mouth 2 (two) times daily.   HYDRALAZINE (APRESOLINE) 50 MG TABLET    Take 1 tablet (50 mg total) by mouth every 8 (eight) hours.     Physical Exam:  Vitals:   06/29/17 1035  BP: (!) 152/94  Pulse: 97  Temp: 97.9 F (36.6 C)  TempSrc: Oral  SpO2: 99%  Weight: 160 lb (72.6 kg)  Height: 5\' 5"  (1.651 m)  Body mass index is 26.63 kg/m.  Physical Exam  Constitutional: She is oriented to person, place, and time. She appears well-developed and well-nourished. No distress.  HENT:  Head: Normocephalic and atraumatic.  Eyes: Conjunctivae and EOM are normal. Pupils are equal, round, and reactive to light.  Cardiovascular: Normal rate, regular rhythm and normal heart sounds.  No murmur heard. Pulmonary/Chest: Effort normal and breath sounds normal. No respiratory distress. She has no wheezes.  Abdominal: Soft. Bowel sounds are normal.  Musculoskeletal: Normal range of motion. She exhibits no edema or tenderness.  Neurological: She is alert and oriented to person, place, and time.  Skin: Skin is warm and dry.  Psychiatric: Her behavior is normal. Judgment and thought content normal. Her affect is blunt.    Labs reviewed: Basic Metabolic Panel: Recent Labs    05/06/17 2000  05/07/17 0952  05/09/17 0434 05/10/17 0441 05/11/17 0933  NA  --    < > 139   < > 139 141 139  K  --    < > 4.6   < > 4.3 4.2 3.9  CL  --    < > 109   < > 104 105 106  CO2  --    < > 20*   < > 24 23 22   GLUCOSE  --    < > 220*   < > 121* 142* 186*  BUN  --    < > 57*   < > 24* 21* 24*  CREATININE  --    < > 7.08*   < > 4.39* 3.90* 5.39*  CALCIUM  --    < > 8.4*   < > 8.6* 8.3* 8.7*  PHOS 5.3*  --  5.3*  --   --   --  4.4   < > = values in this interval not displayed.   Liver Function Tests: Recent Labs    12/03/16 0625  03/09/17 2219 03/11/17 0327   03/14/17 0554 05/07/17 0952 05/11/17 0933  AST 13*  --  19 15  --   --   --   --   ALT 13*  --  19 17  --   --   --   --   ALKPHOS 102  --  93 87  --   --   --   --   BILITOT 0.6  --  0.7 0.5  --   --   --   --   PROT 6.3*  --  6.0* 5.8*  --   --   --   --   ALBUMIN 3.1*   < > 3.1* 2.8*   < > 2.4* 2.7* 3.1*   < > = values in this interval not displayed.   No results for input(s): LIPASE, AMYLASE in the last 8760 hours. No results for input(s): AMMONIA in the last 8760 hours. CBC: Recent Labs    12/02/16 0835  03/09/17 2219  05/08/17 1729 05/09/17 0434 05/11/17 0933  WBC 7.7   < > 11.0*   < > 6.5 7.5 6.6  NEUTROABS 4.4  --  5.9  --   --  4.6  --   HGB 9.3*   < > 8.4*   < > 7.5* 9.0* 8.9*  HCT 27.9*   < > 26.3*   < > 23.1* 26.9* 27.5*  MCV 86.6   < > 90.1   < > 87.2 87.1 88.4  PLT 237   < > 249   < >  155 139* 173   < > = values in this interval not displayed.   Lipid Panel: Recent Labs    09/28/16 0903 12/03/16 0625  CHOL 161 252*  HDL 62 45  LDLCALC 77 151*  TRIG 110 281*  CHOLHDL 2.6 5.6   TSH: No results for input(s): TSH in the last 8760 hours. A1C: Lab Results  Component Value Date   HGBA1C 5.7 (H) 03/10/2017     Assessment/Plan 1. Cerebrovascular accident (CVA), unspecified mechanism ([place]) Hx of CVA, will continue Plavix with goal of adequate blood pressure, blood glucose and lipid control. Encouraged compliance with medication and medical regimen.  - pravastatin (PRAVACHOL) 40 MG tablet; Take 1 tablet (40 mg total) by mouth at bedtime.  Dispense: 90 tablet; Refill: 1 - clopidogrel (PLAVIX) 75 MG tablet; Take 1 tablet (75 mg total) by mouth daily. Do not use blister packs  Dispense: 90 tablet; Refill: 1  2. Depression with anxiety With improve mood at this time. To continue paxil  - PARoxetine (PAXIL) 40 MG tablet; Take 1 tablet (40 mg total) by mouth daily.  Dispense: 90 tablet; Refill: 1  3. ESRD (end stage renal disease) on dialysis [facility]) Feeling  much better since starting HD, continues Tuesday, Thursday and saturday  4. 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 Essential hypertension -elevated today, not taking atenolol, again went over medication with pt and brother and encouraged compliance with medication regimen, brother is trying to help out. To continue on amlodipine, atenolol and lasix on non-dialysis days - amLODipine (NORVASC) 5 MG tablet; Take 1 tablet (5 mg total) by mouth daily. Do not use blister packs  Dispense: 90 tablet; Refill: 1  6. Nonintractable migraine, unspecified migraine type -no recurrent migraines at this time.   7. Hyperlipidemia, unspecified hyperlipidemia type -LDL Not at goal on last lab, currently on pravastatin and zetia, will follow up lab - Lipid Panel; Future  8. Rib pain on right side -tender with movement but not on exam. No bruising or swelling noted. To use heat to effected area and follow with muscle rub TID; to use tylenol PRN - DG Chest 2 View due to ongoing pain after fall  9. Gastroesophageal reflux disease without esophagitis omeprazole interacts with plavix, will stop and start protonix  - pantoprazole (PROTONIX) 40 MG tablet; Take 1 tablet (40 mg total) by mouth daily.  Dispense: 30 tablet; Refill: 3  10. Smoker Encouraged to schedule screening lung CT and smoking cessation   Next appt: 07/02/2017 for fasting labs  Sadik Piascik K. [place], [facility] [phone]

## 2017-06-29 NOTE — Patient Instructions (Addendum)
To go to [facility] and get chest xray Also to schedule screening chest CT while you are there  To use heat and then follow with muscle rub three times daily To also use tylenol 650 mg by mouth as needed pain  Schedule fasting blood work in the next few weeks here at [facility]  Stop omeprazole Start protonix 40 mg daily   Follow up with Dr Eulas Post in 4 month for routine follow up,  [name] in 8 months for routine follow up

## 2017-06-30 DIAGNOSIS — E8770 Fluid overload, unspecified: Secondary | ICD-10-CM | POA: Diagnosis not present

## 2017-06-30 DIAGNOSIS — N2581 Secondary hyperparathyroidism of renal origin: Secondary | ICD-10-CM | POA: Diagnosis not present

## 2017-06-30 DIAGNOSIS — N186 End stage renal disease: Secondary | ICD-10-CM | POA: Diagnosis not present

## 2017-07-02 ENCOUNTER — Other Ambulatory Visit: Payer: Medicare HMO

## 2017-07-02 DIAGNOSIS — E1149 Type 2 diabetes mellitus with other diabetic neurological complication: Secondary | ICD-10-CM

## 2017-07-02 DIAGNOSIS — E785 Hyperlipidemia, unspecified: Secondary | ICD-10-CM

## 2017-07-02 DIAGNOSIS — Z794 Long term (current) use of insulin: Secondary | ICD-10-CM

## 2017-07-03 DIAGNOSIS — N186 End stage renal disease: Secondary | ICD-10-CM | POA: Diagnosis not present

## 2017-07-03 DIAGNOSIS — N2581 Secondary hyperparathyroidism of renal origin: Secondary | ICD-10-CM | POA: Diagnosis not present

## 2017-07-03 DIAGNOSIS — E8770 Fluid overload, unspecified: Secondary | ICD-10-CM | POA: Diagnosis not present

## 2017-07-03 LAB — LIPID PANEL
CHOL/HDL RATIO: 3.2 (calc) (ref <5.0)
Cholesterol: 162 mg/dL (ref <200)
HDL: 51 mg/dL (ref >50)
LDL CHOLESTEROL (CALC): 89 mg/dL
Non-HDL Cholesterol (Calc): 111 mg/dL (calc) (ref <130)
Triglycerides: 120 mg/dL (ref <150)

## 2017-07-03 LAB — HEMOGLOBIN A1C
EAG (MMOL/L): 6.8 (calc)
Hgb A1c MFr Bld: 5.9 % of total Hgb — ABNORMAL HIGH (ref <5.7)
MEAN PLASMA GLUCOSE: 123 (calc)

## 2017-07-05 DIAGNOSIS — N2581 Secondary hyperparathyroidism of renal origin: Secondary | ICD-10-CM | POA: Diagnosis not present

## 2017-07-05 DIAGNOSIS — N186 End stage renal disease: Secondary | ICD-10-CM | POA: Diagnosis not present

## 2017-07-05 DIAGNOSIS — E8770 Fluid overload, unspecified: Secondary | ICD-10-CM | POA: Diagnosis not present

## 2017-07-06 DIAGNOSIS — E8770 Fluid overload, unspecified: Secondary | ICD-10-CM | POA: Diagnosis not present

## 2017-07-06 DIAGNOSIS — N186 End stage renal disease: Secondary | ICD-10-CM | POA: Diagnosis not present

## 2017-07-06 DIAGNOSIS — N2581 Secondary hyperparathyroidism of renal origin: Secondary | ICD-10-CM | POA: Diagnosis not present

## 2017-07-07 DIAGNOSIS — E8770 Fluid overload, unspecified: Secondary | ICD-10-CM | POA: Diagnosis not present

## 2017-07-07 DIAGNOSIS — N186 End stage renal disease: Secondary | ICD-10-CM | POA: Diagnosis not present

## 2017-07-07 DIAGNOSIS — N2581 Secondary hyperparathyroidism of renal origin: Secondary | ICD-10-CM | POA: Diagnosis not present

## 2017-07-09 ENCOUNTER — Ambulatory Visit (INDEPENDENT_AMBULATORY_CARE_PROVIDER_SITE_OTHER): Payer: Medicare HMO | Admitting: Nurse Practitioner

## 2017-07-09 ENCOUNTER — Encounter: Payer: Self-pay | Admitting: Nurse Practitioner

## 2017-07-09 VITALS — BP 136/78 | HR 68 | Temp 98.4°F | Ht 65.0 in | Wt 160.0 lb

## 2017-07-09 DIAGNOSIS — M940 Chondrocostal junction syndrome [Tietze]: Secondary | ICD-10-CM

## 2017-07-09 MED ORDER — PREDNISONE 10 MG (21) PO TBPK: ORAL_TABLET | ORAL | 0 refills | Status: DC

## 2017-07-09 NOTE — Patient Instructions (Addendum)
To take prednisone as prescribed To continue muscle rubs- use AFTER heat/ice  Alternate heat/ice 2-3 times daily 20-30 mins   Costochondritis Costochondritis is swelling and irritation (inflammation) of the tissue (cartilage) that connects your ribs to your breastbone (sternum). This causes pain in the front of your chest. Usually, the pain:  Starts gradually.  Is in more than one rib.  This condition usually goes away on its own over time. Follow these instructions at home:  Do not do anything that makes your pain worse.  If directed, put ice on the painful area: ? Put ice in a plastic bag. ? Place a towel between your skin and the bag. ? Leave the ice on for 20 minutes, 2-3 times a day.  If directed, put heat on the affected area as often as told by your doctor. Use the heat source that your doctor tells you to use, such as a moist heat pack or a heating pad. ? Place a towel between your skin and the heat source. ? Leave the heat on for 20-30 minutes. ? Take off the heat if your skin turns bright red. This is very important if you cannot feel pain, heat, or cold. You may have a greater risk of getting burned.  Take over-the-counter and prescription medicines only as told by your doctor.  Return to your normal activities as told by your doctor. Ask your doctor what activities are safe for you.  Keep all follow-up visits as told by your doctor. This is important. Contact a doctor if:  You have chills or a fever.  Your pain does not go away or it gets worse.  You have a cough that does not go away. Get help right away if:  You are short of breath. This information is not intended to replace advice given to you by your health care provider. Make sure you discuss any questions you have with your health care provider. Document Released: 09/27/2007 Document Revised: 10/29/2015 Document Reviewed: 08/04/2015 Elsevier Interactive Patient Education  [employer].

## 2017-07-09 NOTE — Progress Notes (Signed)
Careteam: Patient Care Team: [name], NP as PCP - General (Nurse Practitioner) [name], MD as Consulting Physician (Ophthalmology) [name], MD as Consulting Physician (Nephrology) [name], NP as Nurse Practitioner (Nurse Practitioner)  Advanced Directive information    Allergies  Allergen Reactions  . Lisinopril Other (See Comments)    Abnormal Kidney Function   . Pioglitazone Other (See Comments)    REACTION: Desquamation of skin of the palm  . Morphine And Related Nausea And Vomiting  . Sulfonamide Derivatives Rash    Chief Complaint  Patient presents with  . Acute Visit    Pt is being seen due to right side pain that runs from under arm to lower right side. Pt states pain is 7/10 and is a constant ache.      HPI: Patient is a 72 y.o. female seen in the office today due to ongoing pain.  Pt was seen on 06/29/17 for routine follow up and noted right rib pain after fall, xray was obtained which was negative. She was taking tylenol which was not effective but muscle rubs was helping but no longer effective.  Reports she is hurting pain is a 7/10.  Pain under right armpit/chest area.  Pain is constant.  Goes down side.  Pain "gave me hell yesterday"  Took her friends hydrocodone which did not really touch pain.  No shortness of breath No rash No breast pain.   Review of Systems:  Review of Systems  Constitutional: Negative for chills, fever and malaise/fatigue.  Respiratory: Negative for cough and shortness of breath.   Cardiovascular: Negative for chest pain.  Musculoskeletal: Positive for myalgias.       Chest wall pain to right side and front  Skin: Negative for itching and rash.  Neurological: Negative for sensory change.    Past Medical History:  Diagnosis Date  . Anemia   . Anxiety   . Arthritis    lower back and knees   . Asthma    childhood  . Barrett's esophagus   . Chronic kidney disease   . Depression     . Diabetes mellitus   . Dysphagia   . Eczema   . History of herpes zoster 02/2010   Recovered fully after period of acute herpetic neuralgia tx w/ gabapentin.   Marland Kitchen Hx MRSA infection 2009  . Hyperlipidemia   . Hypertension   . Neuromuscular disorder (HCC)    chronic pain  . Personal history of colonic polyps 10/17/2010   hyperplastic   Past Surgical History:  Procedure Laterality Date  . ABDOMINAL HYSTERECTOMY     unclear when  . AV FISTULA PLACEMENT Left 02/13/2017   Procedure: ARTERIOVENOUS (AV) GORE-TEX STRETCH GRAFT INSERTION INTO LEFT ARM;  Surgeon: Elam Dutch, MD;  Location: [facility];  Service: Vascular;  Laterality: Left;  . CATARACT EXTRACTION Bilateral   . thigh surg     left side due to MRSA   Social History:   reports that she has been smoking cigarettes.  She has a 40.00 pack-year smoking history. she has never used smokeless tobacco. She reports that she drinks about 1.2 oz of alcohol per week. She reports that she does not use drugs.  Family History  Problem Relation Age of Onset  . Hypertension Father   . Kidney disease Father   . Diabetes Brother   . Colon cancer Neg Hx   . CAD Neg Hx     Medications: Patient's Medications  New Prescriptions  No medications on file  Previous Medications   AMLODIPINE (NORVASC) 5 MG TABLET    Take 1 tablet (5 mg total) by mouth daily. Do not use blister packs   ASPIRIN 81 MG CHEWABLE TABLET    Chew 1 tablet (81 mg total) by mouth daily.   ATENOLOL (TENORMIN) 25 MG TABLET    Take 1 tablet (25 mg total) by mouth daily.   CALCITRIOL (ROCALTROL) 0.25 MCG CAPSULE    Take 1 capsule (0.25 mcg total) by mouth Every Tuesday,Thursday,and Saturday with dialysis.   CHOLECALCIFEROL (NATURAL VITAMIN D-3) 5000 UNITS TABS    Take 1 tablet (5,000 Units total) by mouth daily.   CLOPIDOGREL (PLAVIX) 75 MG TABLET    Take 1 tablet (75 mg total) by mouth daily. Do not use blister packs   EZETIMIBE (ZETIA) 10 MG TABLET    Take 1 tablet (10 mg  total) by mouth daily.   FUROSEMIDE (LASIX) 40 MG TABLET    Take 1 tablet (40 mg) by mouth on Sunday, Monday, Wednesday, and Friday (non-dialysis days).   LATANOPROST (XALATAN) 0.005 % OPHTHALMIC SOLUTION    Place 1 drop into both eyes at bedtime. Reported on 08/27/2015   ONDANSETRON (ZOFRAN) 4 MG TABLET    On dialysis days as needed for nausea   PANTOPRAZOLE (PROTONIX) 40 MG TABLET    Take 1 tablet (40 mg total) by mouth daily.   PAROXETINE (PAXIL) 40 MG TABLET    Take 1 tablet (40 mg total) by mouth daily.   PRAVASTATIN (PRAVACHOL) 40 MG TABLET    Take 1 tablet (40 mg total) by mouth at bedtime.   SUMATRIPTAN (IMITREX) 25 MG TABLET    1 tablet daily as needed for headache, May repeat in 2 hours if headache persists or recurs. Max of 2 tablets in 24 hours   TRIAMCINOLONE CREAM (KENALOG) 0.1 %    Apply 1 application topically 3 (three) times daily as needed (rash).   Modified Medications   No medications on file  Discontinued Medications   No medications on file     Physical Exam:  Vitals:   07/09/17 1006  BP: 136/78  Pulse: 68  Temp: 98.4 F (36.9 C)  TempSrc: Oral  SpO2: 97%  Weight: 160 lb (72.6 kg)  Height: 5\' 5"  (1.651 m)   Body mass index is 26.63 kg/m.  Physical Exam  Constitutional: She appears well-developed and well-nourished.  Cardiovascular: Normal rate, regular rhythm and normal heart sounds.  Pulmonary/Chest: Effort normal and breath sounds normal. She exhibits tenderness.    Abdominal: Soft. Bowel sounds are normal. She exhibits no distension. There is no tenderness.  Skin: Skin is warm and dry. No erythema.    Labs reviewed: Basic Metabolic Panel: Recent Labs    05/06/17 2000  05/07/17 0952  05/09/17 0434 05/10/17 0441 05/11/17 0933  NA  --    < > 139   < > 139 141 139  K  --    < > 4.6   < > 4.3 4.2 3.9  CL  --    < > 109   < > 104 105 106  CO2  --    < > 20*   < > 24 23 22   GLUCOSE  --    < > 220*   < > 121* 142* 186*  BUN  --    < > 57*   < >  24* 21* 24*  CREATININE  --    < > 7.08*   < >  4.39* 3.90* 5.39*  CALCIUM  --    < > 8.4*   < > 8.6* 8.3* 8.7*  PHOS 5.3*  --  5.3*  --   --   --  4.4   < > = values in this interval not displayed.   Liver Function Tests: Recent Labs    12/03/16 0625  03/09/17 2219 03/11/17 0327  03/14/17 0554 05/07/17 0952 05/11/17 0933  AST 13*  --  19 15  --   --   --   --   ALT 13*  --  19 17  --   --   --   --   ALKPHOS 102  --  93 87  --   --   --   --   BILITOT 0.6  --  0.7 0.5  --   --   --   --   PROT 6.3*  --  6.0* 5.8*  --   --   --   --   ALBUMIN 3.1*   < > 3.1* 2.8*   < > 2.4* 2.7* 3.1*   < > = values in this interval not displayed.   No results for input(s): LIPASE, AMYLASE in the last 8760 hours. No results for input(s): AMMONIA in the last 8760 hours. CBC: Recent Labs    12/02/16 0835  03/09/17 2219  05/08/17 1729 05/09/17 0434 05/11/17 0933  WBC 7.7   < > 11.0*   < > 6.5 7.5 6.6  NEUTROABS 4.4  --  5.9  --   --  4.6  --   HGB 9.3*   < > 8.4*   < > 7.5* 9.0* 8.9*  HCT 27.9*   < > 26.3*   < > 23.1* 26.9* 27.5*  MCV 86.6   < > 90.1   < > 87.2 87.1 88.4  PLT 237   < > 249   < > 155 139* 173   < > = values in this interval not displayed.   Lipid Panel: Recent Labs    09/28/16 0903 12/03/16 0625 07/02/17 0830  CHOL 161 252* 162  HDL 62 45 51  LDLCALC 77 151* 89  TRIG 110 281* 120  CHOLHDL 2.6 5.6 3.2   TSH: No results for input(s): TSH in the last 8760 hours. A1C: Lab Results  Component Value Date   HGBA1C 5.9 (H) 07/02/2017     Assessment/Plan 1. Costochondritis -worsening pain, chest xray negative after fall. -can alternate heat/ice and to use muscle rub after.  - predniSONE (STERAPRED UNI-PAK 21 TAB) 10 MG (21) TBPK tablet; Use as directed  Dispense: 21 tablet; Refill: 0 -encouraged not to use friends medication as to this can interact with what she is currently taking and she has ESRD.  Next appt: 11/02/2017, sooner if needed  Carlos American. [place],  [facility] [phone]

## 2017-07-10 DIAGNOSIS — E8770 Fluid overload, unspecified: Secondary | ICD-10-CM | POA: Diagnosis not present

## 2017-07-10 DIAGNOSIS — N186 End stage renal disease: Secondary | ICD-10-CM | POA: Diagnosis not present

## 2017-07-10 DIAGNOSIS — N2581 Secondary hyperparathyroidism of renal origin: Secondary | ICD-10-CM | POA: Diagnosis not present

## 2017-07-11 ENCOUNTER — Telehealth: Payer: Self-pay

## 2017-07-11 NOTE — Telephone Encounter (Signed)
Noted, will await these notes

## 2017-07-11 NOTE — Telephone Encounter (Signed)
Patient's brother walked in to inform [name], NP that patient was recently told to stop Vit D, amlodipine, furosemide, and decrease atenolol 1/2 pill at night.   Iona Beard would like to confirm that Janett Billow is in agreement with medication changes.  I called [facility] to request last OV note and spoke with Stanton Kidney per Stanton Kidney I need to called the dialysis center at [phone] to inquire about medication changes.  I called [facility] and spoke with nurse Freddi Starr states it's standard protocol to have patient's stop the above medications when starting dialysis. Otila Kluver stated they do not have office notes yet she can fax a copy of the orders from the provider.   Awaiting Fax

## 2017-07-11 NOTE — Telephone Encounter (Signed)
Incoming fax received from  [facility] [address] [place], [place] [address] Phone [phone] Fax [phone]  Janett Billow reviewed and is in agreement with medication changes and instructed for me to update medication list to reflect changes.   Per patient's brother if Janett Billow is in agreement no need to call.

## 2017-07-12 DIAGNOSIS — E8770 Fluid overload, unspecified: Secondary | ICD-10-CM | POA: Diagnosis not present

## 2017-07-12 DIAGNOSIS — N186 End stage renal disease: Secondary | ICD-10-CM | POA: Diagnosis not present

## 2017-07-12 DIAGNOSIS — N2581 Secondary hyperparathyroidism of renal origin: Secondary | ICD-10-CM | POA: Diagnosis not present

## 2017-07-14 DIAGNOSIS — N186 End stage renal disease: Secondary | ICD-10-CM | POA: Diagnosis not present

## 2017-07-14 DIAGNOSIS — E8770 Fluid overload, unspecified: Secondary | ICD-10-CM | POA: Diagnosis not present

## 2017-07-14 DIAGNOSIS — N2581 Secondary hyperparathyroidism of renal origin: Secondary | ICD-10-CM | POA: Diagnosis not present

## 2017-07-17 DIAGNOSIS — E8770 Fluid overload, unspecified: Secondary | ICD-10-CM | POA: Diagnosis not present

## 2017-07-17 DIAGNOSIS — N186 End stage renal disease: Secondary | ICD-10-CM | POA: Diagnosis not present

## 2017-07-17 DIAGNOSIS — N2581 Secondary hyperparathyroidism of renal origin: Secondary | ICD-10-CM | POA: Diagnosis not present

## 2017-07-19 DIAGNOSIS — E8770 Fluid overload, unspecified: Secondary | ICD-10-CM | POA: Diagnosis not present

## 2017-07-19 DIAGNOSIS — N186 End stage renal disease: Secondary | ICD-10-CM | POA: Diagnosis not present

## 2017-07-19 DIAGNOSIS — N2581 Secondary hyperparathyroidism of renal origin: Secondary | ICD-10-CM | POA: Diagnosis not present

## 2017-07-21 DIAGNOSIS — N186 End stage renal disease: Secondary | ICD-10-CM | POA: Diagnosis not present

## 2017-07-21 DIAGNOSIS — N2581 Secondary hyperparathyroidism of renal origin: Secondary | ICD-10-CM | POA: Diagnosis not present

## 2017-07-21 DIAGNOSIS — E8770 Fluid overload, unspecified: Secondary | ICD-10-CM | POA: Diagnosis not present

## 2017-07-23 DIAGNOSIS — E1129 Type 2 diabetes mellitus with other diabetic kidney complication: Secondary | ICD-10-CM | POA: Diagnosis not present

## 2017-07-23 DIAGNOSIS — N186 End stage renal disease: Secondary | ICD-10-CM | POA: Diagnosis not present

## 2017-07-23 DIAGNOSIS — Z992 Dependence on renal dialysis: Secondary | ICD-10-CM | POA: Diagnosis not present

## 2017-07-24 DIAGNOSIS — N186 End stage renal disease: Secondary | ICD-10-CM | POA: Diagnosis not present

## 2017-07-24 DIAGNOSIS — N2581 Secondary hyperparathyroidism of renal origin: Secondary | ICD-10-CM | POA: Diagnosis not present

## 2017-07-26 DIAGNOSIS — N186 End stage renal disease: Secondary | ICD-10-CM | POA: Diagnosis not present

## 2017-07-26 DIAGNOSIS — N2581 Secondary hyperparathyroidism of renal origin: Secondary | ICD-10-CM | POA: Diagnosis not present

## 2017-07-28 DIAGNOSIS — N186 End stage renal disease: Secondary | ICD-10-CM | POA: Diagnosis not present

## 2017-07-28 DIAGNOSIS — N2581 Secondary hyperparathyroidism of renal origin: Secondary | ICD-10-CM | POA: Diagnosis not present

## 2017-07-31 DIAGNOSIS — N2581 Secondary hyperparathyroidism of renal origin: Secondary | ICD-10-CM | POA: Diagnosis not present

## 2017-07-31 DIAGNOSIS — N186 End stage renal disease: Secondary | ICD-10-CM | POA: Diagnosis not present

## 2017-08-02 DIAGNOSIS — N2581 Secondary hyperparathyroidism of renal origin: Secondary | ICD-10-CM | POA: Diagnosis not present

## 2017-08-02 DIAGNOSIS — N186 End stage renal disease: Secondary | ICD-10-CM | POA: Diagnosis not present

## 2017-08-04 DIAGNOSIS — N186 End stage renal disease: Secondary | ICD-10-CM | POA: Diagnosis not present

## 2017-08-04 DIAGNOSIS — N2581 Secondary hyperparathyroidism of renal origin: Secondary | ICD-10-CM | POA: Diagnosis not present

## 2017-08-07 DIAGNOSIS — N186 End stage renal disease: Secondary | ICD-10-CM | POA: Diagnosis not present

## 2017-08-07 DIAGNOSIS — N2581 Secondary hyperparathyroidism of renal origin: Secondary | ICD-10-CM | POA: Diagnosis not present

## 2017-08-09 DIAGNOSIS — N186 End stage renal disease: Secondary | ICD-10-CM | POA: Diagnosis not present

## 2017-08-09 DIAGNOSIS — N2581 Secondary hyperparathyroidism of renal origin: Secondary | ICD-10-CM | POA: Diagnosis not present

## 2017-08-11 DIAGNOSIS — N2581 Secondary hyperparathyroidism of renal origin: Secondary | ICD-10-CM | POA: Diagnosis not present

## 2017-08-11 DIAGNOSIS — N186 End stage renal disease: Secondary | ICD-10-CM | POA: Diagnosis not present

## 2017-08-14 DIAGNOSIS — N2581 Secondary hyperparathyroidism of renal origin: Secondary | ICD-10-CM | POA: Diagnosis not present

## 2017-08-14 DIAGNOSIS — N186 End stage renal disease: Secondary | ICD-10-CM | POA: Diagnosis not present

## 2017-08-16 DIAGNOSIS — N186 End stage renal disease: Secondary | ICD-10-CM | POA: Diagnosis not present

## 2017-08-16 DIAGNOSIS — N2581 Secondary hyperparathyroidism of renal origin: Secondary | ICD-10-CM | POA: Diagnosis not present

## 2017-08-16 LAB — HEMOGLOBIN A1C: Hemoglobin A1C: 6.6

## 2017-08-18 DIAGNOSIS — N186 End stage renal disease: Secondary | ICD-10-CM | POA: Diagnosis not present

## 2017-08-18 DIAGNOSIS — N2581 Secondary hyperparathyroidism of renal origin: Secondary | ICD-10-CM | POA: Diagnosis not present

## 2017-08-21 DIAGNOSIS — N186 End stage renal disease: Secondary | ICD-10-CM | POA: Diagnosis not present

## 2017-08-21 DIAGNOSIS — N2581 Secondary hyperparathyroidism of renal origin: Secondary | ICD-10-CM | POA: Diagnosis not present

## 2017-08-23 DIAGNOSIS — N2581 Secondary hyperparathyroidism of renal origin: Secondary | ICD-10-CM | POA: Diagnosis not present

## 2017-08-23 DIAGNOSIS — N186 End stage renal disease: Secondary | ICD-10-CM | POA: Diagnosis not present

## 2017-08-25 DIAGNOSIS — N2581 Secondary hyperparathyroidism of renal origin: Secondary | ICD-10-CM | POA: Diagnosis not present

## 2017-08-25 DIAGNOSIS — N186 End stage renal disease: Secondary | ICD-10-CM | POA: Diagnosis not present

## 2017-08-28 DIAGNOSIS — N186 End stage renal disease: Secondary | ICD-10-CM | POA: Diagnosis not present

## 2017-08-28 DIAGNOSIS — N2581 Secondary hyperparathyroidism of renal origin: Secondary | ICD-10-CM | POA: Diagnosis not present

## 2017-08-29 ENCOUNTER — Other Ambulatory Visit: Payer: Self-pay | Admitting: *Deleted

## 2017-08-29 DIAGNOSIS — I639 Cerebral infarction, unspecified: Secondary | ICD-10-CM

## 2017-08-29 MED ORDER — EZETIMIBE 10 MG PO TABS: 10.0000 mg | ORAL_TABLET | Freq: Every day | ORAL | 1 refills | Status: DC

## 2017-08-29 NOTE — Telephone Encounter (Signed)
Brother, Iona Beard stopped by office and stated that patient's Atenolol was stopped and her pharmacy has changed from [facility] to [facility]. Medication list updated.  Also needs refill on Ezetimibe 10mg . Faxed to pharmacy.

## 2017-08-30 DIAGNOSIS — N2581 Secondary hyperparathyroidism of renal origin: Secondary | ICD-10-CM | POA: Diagnosis not present

## 2017-08-30 DIAGNOSIS — N186 End stage renal disease: Secondary | ICD-10-CM | POA: Diagnosis not present

## 2017-09-01 DIAGNOSIS — N186 End stage renal disease: Secondary | ICD-10-CM | POA: Diagnosis not present

## 2017-09-01 DIAGNOSIS — N2581 Secondary hyperparathyroidism of renal origin: Secondary | ICD-10-CM | POA: Diagnosis not present

## 2017-09-04 DIAGNOSIS — N186 End stage renal disease: Secondary | ICD-10-CM | POA: Diagnosis not present

## 2017-09-04 DIAGNOSIS — N2581 Secondary hyperparathyroidism of renal origin: Secondary | ICD-10-CM | POA: Diagnosis not present

## 2017-09-06 DIAGNOSIS — N2581 Secondary hyperparathyroidism of renal origin: Secondary | ICD-10-CM | POA: Diagnosis not present

## 2017-09-06 DIAGNOSIS — N186 End stage renal disease: Secondary | ICD-10-CM | POA: Diagnosis not present

## 2017-09-08 DIAGNOSIS — N2581 Secondary hyperparathyroidism of renal origin: Secondary | ICD-10-CM | POA: Diagnosis not present

## 2017-09-08 DIAGNOSIS — N186 End stage renal disease: Secondary | ICD-10-CM | POA: Diagnosis not present

## 2017-09-11 DIAGNOSIS — N2581 Secondary hyperparathyroidism of renal origin: Secondary | ICD-10-CM | POA: Diagnosis not present

## 2017-09-11 DIAGNOSIS — N186 End stage renal disease: Secondary | ICD-10-CM | POA: Diagnosis not present

## 2017-09-13 DIAGNOSIS — N186 End stage renal disease: Secondary | ICD-10-CM | POA: Diagnosis not present

## 2017-09-13 DIAGNOSIS — N2581 Secondary hyperparathyroidism of renal origin: Secondary | ICD-10-CM | POA: Diagnosis not present

## 2017-09-13 LAB — CBC AND DIFFERENTIAL: HEMOGLOBIN: 11.2 — AB (ref 12.0–16.0)

## 2017-09-14 DIAGNOSIS — Z1231 Encounter for screening mammogram for malignant neoplasm of breast: Secondary | ICD-10-CM | POA: Diagnosis not present

## 2017-09-14 DIAGNOSIS — M810 Age-related osteoporosis without current pathological fracture: Secondary | ICD-10-CM | POA: Diagnosis not present

## 2017-09-14 DIAGNOSIS — M8588 Other specified disorders of bone density and structure, other site: Secondary | ICD-10-CM | POA: Diagnosis not present

## 2017-09-14 LAB — HM DEXA SCAN

## 2017-09-14 LAB — HM MAMMOGRAPHY

## 2017-09-15 DIAGNOSIS — N186 End stage renal disease: Secondary | ICD-10-CM | POA: Diagnosis not present

## 2017-09-15 DIAGNOSIS — N2581 Secondary hyperparathyroidism of renal origin: Secondary | ICD-10-CM | POA: Diagnosis not present

## 2017-09-18 DIAGNOSIS — N186 End stage renal disease: Secondary | ICD-10-CM | POA: Diagnosis not present

## 2017-09-18 DIAGNOSIS — N2581 Secondary hyperparathyroidism of renal origin: Secondary | ICD-10-CM | POA: Diagnosis not present

## 2017-09-19 ENCOUNTER — Encounter: Payer: Self-pay | Admitting: *Deleted

## 2017-09-20 DIAGNOSIS — N186 End stage renal disease: Secondary | ICD-10-CM | POA: Diagnosis not present

## 2017-09-20 DIAGNOSIS — N2581 Secondary hyperparathyroidism of renal origin: Secondary | ICD-10-CM | POA: Diagnosis not present

## 2017-09-20 LAB — BASIC METABOLIC PANEL
Creatinine: 8.8 — AB (ref 0.5–1.1)
Glucose: 81
POTASSIUM: 4.3 (ref 3.4–5.3)
SODIUM: 142 (ref 137–147)

## 2017-09-20 LAB — CBC AND DIFFERENTIAL
Hemoglobin: 10.6 — AB (ref 12.0–16.0)
Platelets: 198 (ref 150–399)
WBC: 8

## 2017-09-21 DIAGNOSIS — Z992 Dependence on renal dialysis: Secondary | ICD-10-CM | POA: Diagnosis not present

## 2017-09-21 DIAGNOSIS — N186 End stage renal disease: Secondary | ICD-10-CM | POA: Diagnosis not present

## 2017-09-21 DIAGNOSIS — E1129 Type 2 diabetes mellitus with other diabetic kidney complication: Secondary | ICD-10-CM | POA: Diagnosis not present

## 2017-09-22 DIAGNOSIS — N186 End stage renal disease: Secondary | ICD-10-CM | POA: Diagnosis not present

## 2017-09-22 DIAGNOSIS — N2581 Secondary hyperparathyroidism of renal origin: Secondary | ICD-10-CM | POA: Diagnosis not present

## 2017-09-25 DIAGNOSIS — N186 End stage renal disease: Secondary | ICD-10-CM | POA: Diagnosis not present

## 2017-09-25 DIAGNOSIS — N2581 Secondary hyperparathyroidism of renal origin: Secondary | ICD-10-CM | POA: Diagnosis not present

## 2017-09-27 DIAGNOSIS — N2581 Secondary hyperparathyroidism of renal origin: Secondary | ICD-10-CM | POA: Diagnosis not present

## 2017-09-27 DIAGNOSIS — N186 End stage renal disease: Secondary | ICD-10-CM | POA: Diagnosis not present

## 2017-09-27 LAB — CBC AND DIFFERENTIAL: HEMOGLOBIN: 10.2 — AB (ref 12.0–16.0)

## 2017-09-27 LAB — BASIC METABOLIC PANEL: Potassium: 4.4 (ref 3.4–5.3)

## 2017-09-29 DIAGNOSIS — N2581 Secondary hyperparathyroidism of renal origin: Secondary | ICD-10-CM | POA: Diagnosis not present

## 2017-09-29 DIAGNOSIS — N186 End stage renal disease: Secondary | ICD-10-CM | POA: Diagnosis not present

## 2017-10-02 DIAGNOSIS — N2581 Secondary hyperparathyroidism of renal origin: Secondary | ICD-10-CM | POA: Diagnosis not present

## 2017-10-02 DIAGNOSIS — N186 End stage renal disease: Secondary | ICD-10-CM | POA: Diagnosis not present

## 2017-10-04 DIAGNOSIS — N2581 Secondary hyperparathyroidism of renal origin: Secondary | ICD-10-CM | POA: Diagnosis not present

## 2017-10-04 DIAGNOSIS — N186 End stage renal disease: Secondary | ICD-10-CM | POA: Diagnosis not present

## 2017-10-05 ENCOUNTER — Telehealth: Payer: Self-pay

## 2017-10-05 NOTE — Telephone Encounter (Signed)
I tried to call patient to discuss starting prolia injections but her home number has been disconnected and there was no answer at her brother's number.    Per Jessica's recommendations: Discuss starting prolia and patient should be taking vitamin D 2000 iu daily.   Note: Janett Billow has reviewed labs from dialysis center prior to recommendation of prolia.   T score: -3.1  A copy of the bone density has been placed in Brookelin Felber's folder in [place] filing cabinet and the original has been sent to scan.

## 2017-10-06 DIAGNOSIS — N2581 Secondary hyperparathyroidism of renal origin: Secondary | ICD-10-CM | POA: Diagnosis not present

## 2017-10-06 DIAGNOSIS — N186 End stage renal disease: Secondary | ICD-10-CM | POA: Diagnosis not present

## 2017-10-09 DIAGNOSIS — N186 End stage renal disease: Secondary | ICD-10-CM | POA: Diagnosis not present

## 2017-10-09 DIAGNOSIS — N2581 Secondary hyperparathyroidism of renal origin: Secondary | ICD-10-CM | POA: Diagnosis not present

## 2017-10-09 NOTE — Telephone Encounter (Signed)
I called patient's brother but there was no answer and no voicemail set up, so I could not leave a message. Patient's home number is not working at this time.

## 2017-10-11 DIAGNOSIS — N186 End stage renal disease: Secondary | ICD-10-CM | POA: Diagnosis not present

## 2017-10-11 DIAGNOSIS — N2581 Secondary hyperparathyroidism of renal origin: Secondary | ICD-10-CM | POA: Diagnosis not present

## 2017-10-13 DIAGNOSIS — N2581 Secondary hyperparathyroidism of renal origin: Secondary | ICD-10-CM | POA: Diagnosis not present

## 2017-10-13 DIAGNOSIS — N186 End stage renal disease: Secondary | ICD-10-CM | POA: Diagnosis not present

## 2017-10-16 DIAGNOSIS — N186 End stage renal disease: Secondary | ICD-10-CM | POA: Diagnosis not present

## 2017-10-16 DIAGNOSIS — N2581 Secondary hyperparathyroidism of renal origin: Secondary | ICD-10-CM | POA: Diagnosis not present

## 2017-10-18 DIAGNOSIS — N2581 Secondary hyperparathyroidism of renal origin: Secondary | ICD-10-CM | POA: Diagnosis not present

## 2017-10-18 DIAGNOSIS — N186 End stage renal disease: Secondary | ICD-10-CM | POA: Diagnosis not present

## 2017-10-20 DIAGNOSIS — N186 End stage renal disease: Secondary | ICD-10-CM | POA: Diagnosis not present

## 2017-10-20 DIAGNOSIS — N2581 Secondary hyperparathyroidism of renal origin: Secondary | ICD-10-CM | POA: Diagnosis not present

## 2017-10-21 DIAGNOSIS — E1129 Type 2 diabetes mellitus with other diabetic kidney complication: Secondary | ICD-10-CM | POA: Diagnosis not present

## 2017-10-21 DIAGNOSIS — Z992 Dependence on renal dialysis: Secondary | ICD-10-CM | POA: Diagnosis not present

## 2017-10-21 DIAGNOSIS — N186 End stage renal disease: Secondary | ICD-10-CM | POA: Diagnosis not present

## 2017-10-23 DIAGNOSIS — N2581 Secondary hyperparathyroidism of renal origin: Secondary | ICD-10-CM | POA: Diagnosis not present

## 2017-10-23 DIAGNOSIS — N186 End stage renal disease: Secondary | ICD-10-CM | POA: Diagnosis not present

## 2017-10-24 ENCOUNTER — Other Ambulatory Visit: Payer: Self-pay | Admitting: Nurse Practitioner

## 2017-10-24 DIAGNOSIS — K219 Gastro-esophageal reflux disease without esophagitis: Secondary | ICD-10-CM

## 2017-10-25 DIAGNOSIS — N2581 Secondary hyperparathyroidism of renal origin: Secondary | ICD-10-CM | POA: Diagnosis not present

## 2017-10-25 DIAGNOSIS — N186 End stage renal disease: Secondary | ICD-10-CM | POA: Diagnosis not present

## 2017-10-27 DIAGNOSIS — N186 End stage renal disease: Secondary | ICD-10-CM | POA: Diagnosis not present

## 2017-10-27 DIAGNOSIS — N2581 Secondary hyperparathyroidism of renal origin: Secondary | ICD-10-CM | POA: Diagnosis not present

## 2017-10-30 DIAGNOSIS — N2581 Secondary hyperparathyroidism of renal origin: Secondary | ICD-10-CM | POA: Diagnosis not present

## 2017-10-30 DIAGNOSIS — N186 End stage renal disease: Secondary | ICD-10-CM | POA: Diagnosis not present

## 2017-11-01 DIAGNOSIS — N2581 Secondary hyperparathyroidism of renal origin: Secondary | ICD-10-CM | POA: Diagnosis not present

## 2017-11-01 DIAGNOSIS — N186 End stage renal disease: Secondary | ICD-10-CM | POA: Diagnosis not present

## 2017-11-02 ENCOUNTER — Ambulatory Visit: Payer: Medicare HMO | Admitting: Internal Medicine

## 2017-11-03 DIAGNOSIS — N2581 Secondary hyperparathyroidism of renal origin: Secondary | ICD-10-CM | POA: Diagnosis not present

## 2017-11-03 DIAGNOSIS — N186 End stage renal disease: Secondary | ICD-10-CM | POA: Diagnosis not present

## 2017-11-06 DIAGNOSIS — N2581 Secondary hyperparathyroidism of renal origin: Secondary | ICD-10-CM | POA: Diagnosis not present

## 2017-11-06 DIAGNOSIS — N186 End stage renal disease: Secondary | ICD-10-CM | POA: Diagnosis not present

## 2017-11-08 DIAGNOSIS — N2581 Secondary hyperparathyroidism of renal origin: Secondary | ICD-10-CM | POA: Diagnosis not present

## 2017-11-08 DIAGNOSIS — N186 End stage renal disease: Secondary | ICD-10-CM | POA: Diagnosis not present

## 2017-11-10 DIAGNOSIS — N2581 Secondary hyperparathyroidism of renal origin: Secondary | ICD-10-CM | POA: Diagnosis not present

## 2017-11-10 DIAGNOSIS — N186 End stage renal disease: Secondary | ICD-10-CM | POA: Diagnosis not present

## 2017-11-13 DIAGNOSIS — N186 End stage renal disease: Secondary | ICD-10-CM | POA: Diagnosis not present

## 2017-11-13 DIAGNOSIS — N2581 Secondary hyperparathyroidism of renal origin: Secondary | ICD-10-CM | POA: Diagnosis not present

## 2017-11-15 DIAGNOSIS — N186 End stage renal disease: Secondary | ICD-10-CM | POA: Diagnosis not present

## 2017-11-15 DIAGNOSIS — N2581 Secondary hyperparathyroidism of renal origin: Secondary | ICD-10-CM | POA: Diagnosis not present

## 2017-11-17 DIAGNOSIS — N2581 Secondary hyperparathyroidism of renal origin: Secondary | ICD-10-CM | POA: Diagnosis not present

## 2017-11-17 DIAGNOSIS — N186 End stage renal disease: Secondary | ICD-10-CM | POA: Diagnosis not present

## 2017-11-20 DIAGNOSIS — N186 End stage renal disease: Secondary | ICD-10-CM | POA: Diagnosis not present

## 2017-11-20 DIAGNOSIS — N2581 Secondary hyperparathyroidism of renal origin: Secondary | ICD-10-CM | POA: Diagnosis not present

## 2017-11-21 DIAGNOSIS — Z992 Dependence on renal dialysis: Secondary | ICD-10-CM | POA: Diagnosis not present

## 2017-11-21 DIAGNOSIS — E1129 Type 2 diabetes mellitus with other diabetic kidney complication: Secondary | ICD-10-CM | POA: Diagnosis not present

## 2017-11-21 DIAGNOSIS — N186 End stage renal disease: Secondary | ICD-10-CM | POA: Diagnosis not present

## 2017-11-22 DIAGNOSIS — N186 End stage renal disease: Secondary | ICD-10-CM | POA: Diagnosis not present

## 2017-11-22 DIAGNOSIS — N2581 Secondary hyperparathyroidism of renal origin: Secondary | ICD-10-CM | POA: Diagnosis not present

## 2017-11-24 DIAGNOSIS — N2581 Secondary hyperparathyroidism of renal origin: Secondary | ICD-10-CM | POA: Diagnosis not present

## 2017-11-24 DIAGNOSIS — N186 End stage renal disease: Secondary | ICD-10-CM | POA: Diagnosis not present

## 2017-11-26 ENCOUNTER — Telehealth: Payer: Self-pay | Admitting: *Deleted

## 2017-11-26 NOTE — Telephone Encounter (Signed)
Iona Beard, brother stopped in office to inform of medications added to patient's list from the [facility]. Brother stated that they added 2 calcium tablets with meals and a powder but he wasn't sure of the name.   I called [facility] [phone] and spoke with Otila Kluver and the medication added was 2 Calcium tablets three times daily with meals and Sevelamer Carbonate 2.4gm 1 packet three times daily with meals.   Medication list updated.

## 2017-11-27 DIAGNOSIS — N2581 Secondary hyperparathyroidism of renal origin: Secondary | ICD-10-CM | POA: Diagnosis not present

## 2017-11-27 DIAGNOSIS — N186 End stage renal disease: Secondary | ICD-10-CM | POA: Diagnosis not present

## 2017-11-29 DIAGNOSIS — N186 End stage renal disease: Secondary | ICD-10-CM | POA: Diagnosis not present

## 2017-11-29 DIAGNOSIS — N2581 Secondary hyperparathyroidism of renal origin: Secondary | ICD-10-CM | POA: Diagnosis not present

## 2017-12-01 DIAGNOSIS — N2581 Secondary hyperparathyroidism of renal origin: Secondary | ICD-10-CM | POA: Diagnosis not present

## 2017-12-01 DIAGNOSIS — N186 End stage renal disease: Secondary | ICD-10-CM | POA: Diagnosis not present

## 2017-12-04 ENCOUNTER — Other Ambulatory Visit: Payer: Self-pay | Admitting: Nurse Practitioner

## 2017-12-04 DIAGNOSIS — N2581 Secondary hyperparathyroidism of renal origin: Secondary | ICD-10-CM | POA: Diagnosis not present

## 2017-12-04 DIAGNOSIS — I639 Cerebral infarction, unspecified: Secondary | ICD-10-CM

## 2017-12-04 DIAGNOSIS — N186 End stage renal disease: Secondary | ICD-10-CM | POA: Diagnosis not present

## 2017-12-06 ENCOUNTER — Ambulatory Visit: Payer: Self-pay | Admitting: Family

## 2017-12-06 DIAGNOSIS — N2581 Secondary hyperparathyroidism of renal origin: Secondary | ICD-10-CM | POA: Diagnosis not present

## 2017-12-06 DIAGNOSIS — N186 End stage renal disease: Secondary | ICD-10-CM | POA: Diagnosis not present

## 2017-12-07 ENCOUNTER — Ambulatory Visit: Payer: Self-pay | Admitting: Internal Medicine

## 2017-12-08 DIAGNOSIS — N2581 Secondary hyperparathyroidism of renal origin: Secondary | ICD-10-CM | POA: Diagnosis not present

## 2017-12-08 DIAGNOSIS — N186 End stage renal disease: Secondary | ICD-10-CM | POA: Diagnosis not present

## 2017-12-10 ENCOUNTER — Other Ambulatory Visit: Payer: Self-pay

## 2017-12-10 ENCOUNTER — Ambulatory Visit: Payer: Medicare HMO | Admitting: Neurology

## 2017-12-11 DIAGNOSIS — N186 End stage renal disease: Secondary | ICD-10-CM | POA: Diagnosis not present

## 2017-12-11 DIAGNOSIS — N2581 Secondary hyperparathyroidism of renal origin: Secondary | ICD-10-CM | POA: Diagnosis not present

## 2017-12-13 DIAGNOSIS — N2581 Secondary hyperparathyroidism of renal origin: Secondary | ICD-10-CM | POA: Diagnosis not present

## 2017-12-13 DIAGNOSIS — N186 End stage renal disease: Secondary | ICD-10-CM | POA: Diagnosis not present

## 2017-12-15 DIAGNOSIS — N2581 Secondary hyperparathyroidism of renal origin: Secondary | ICD-10-CM | POA: Diagnosis not present

## 2017-12-15 DIAGNOSIS — N186 End stage renal disease: Secondary | ICD-10-CM | POA: Diagnosis not present

## 2017-12-15 IMAGING — DX DG CHEST 2V
2 series · 2 of 2 positions shown · non-contrast
Comparison: Chest x-ray dated March 09, 2017.

CLINICAL DATA: Pneumonia.

EXAM:
CHEST  2 VIEW

[chest lat]
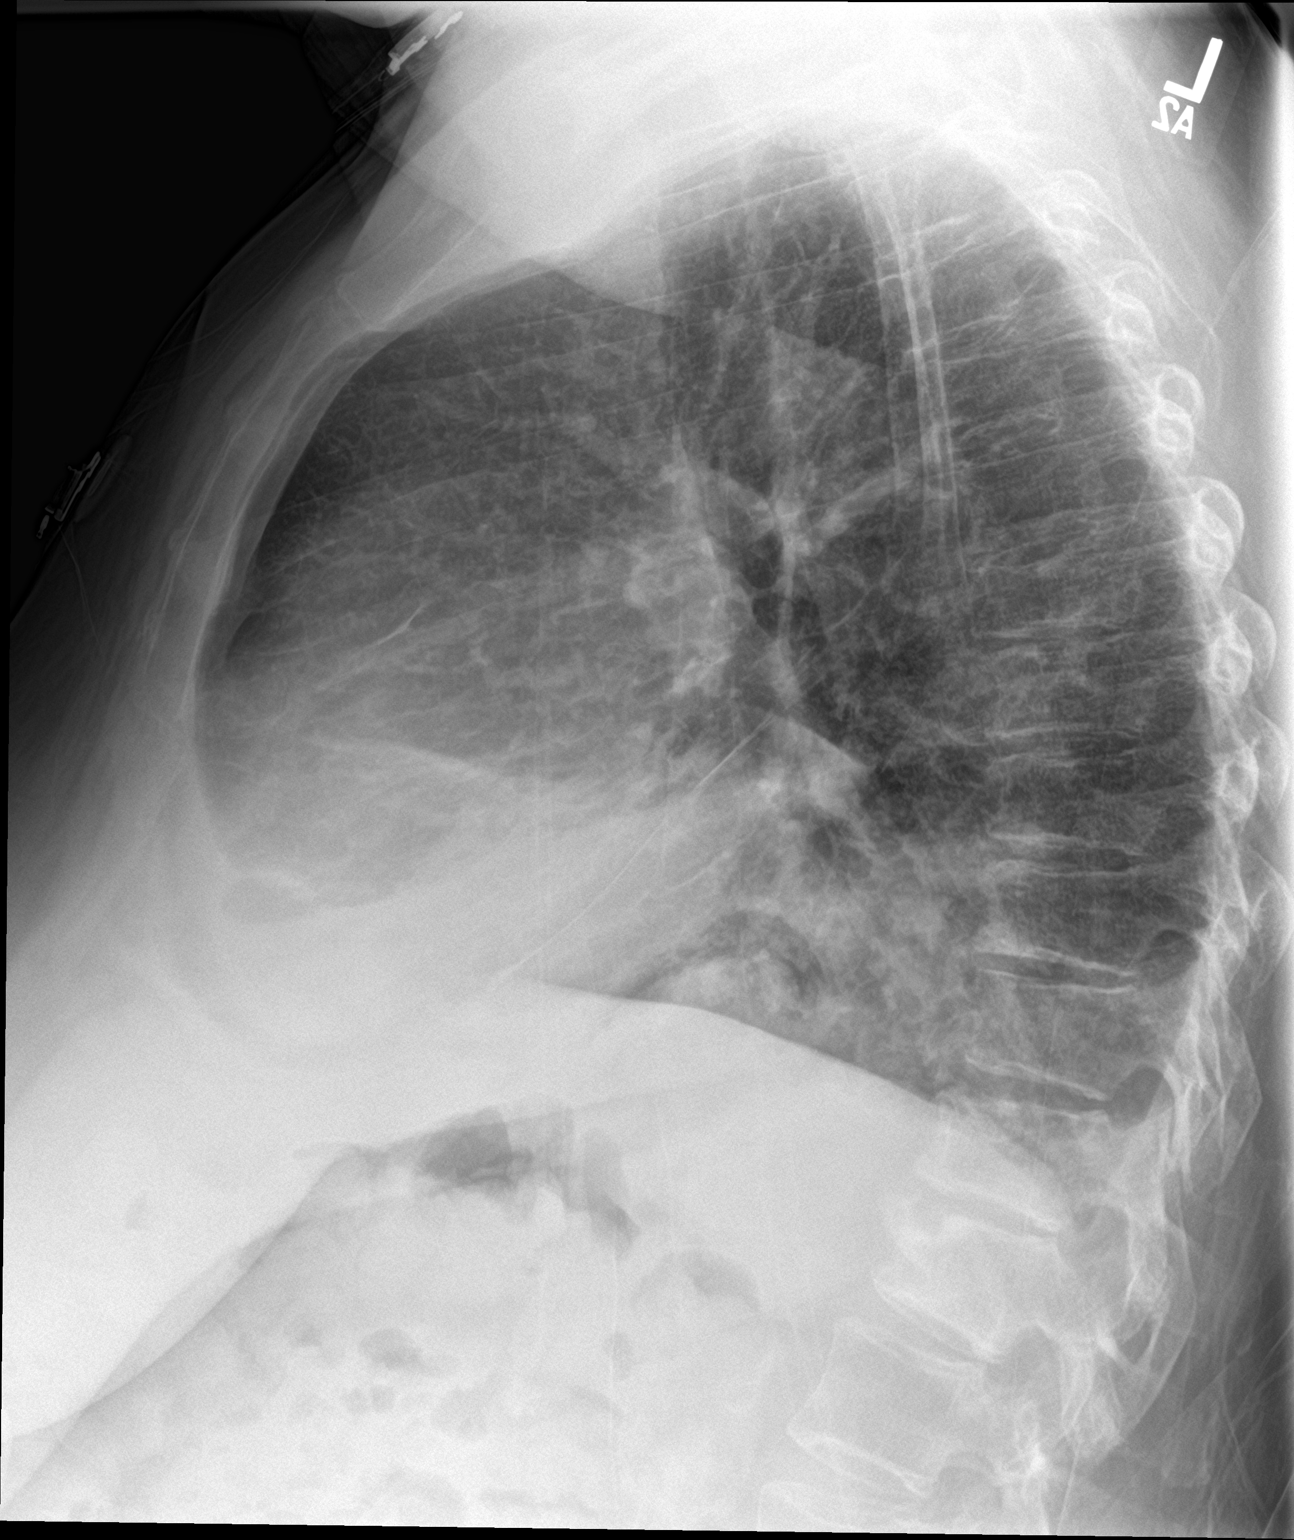

[chest ap]
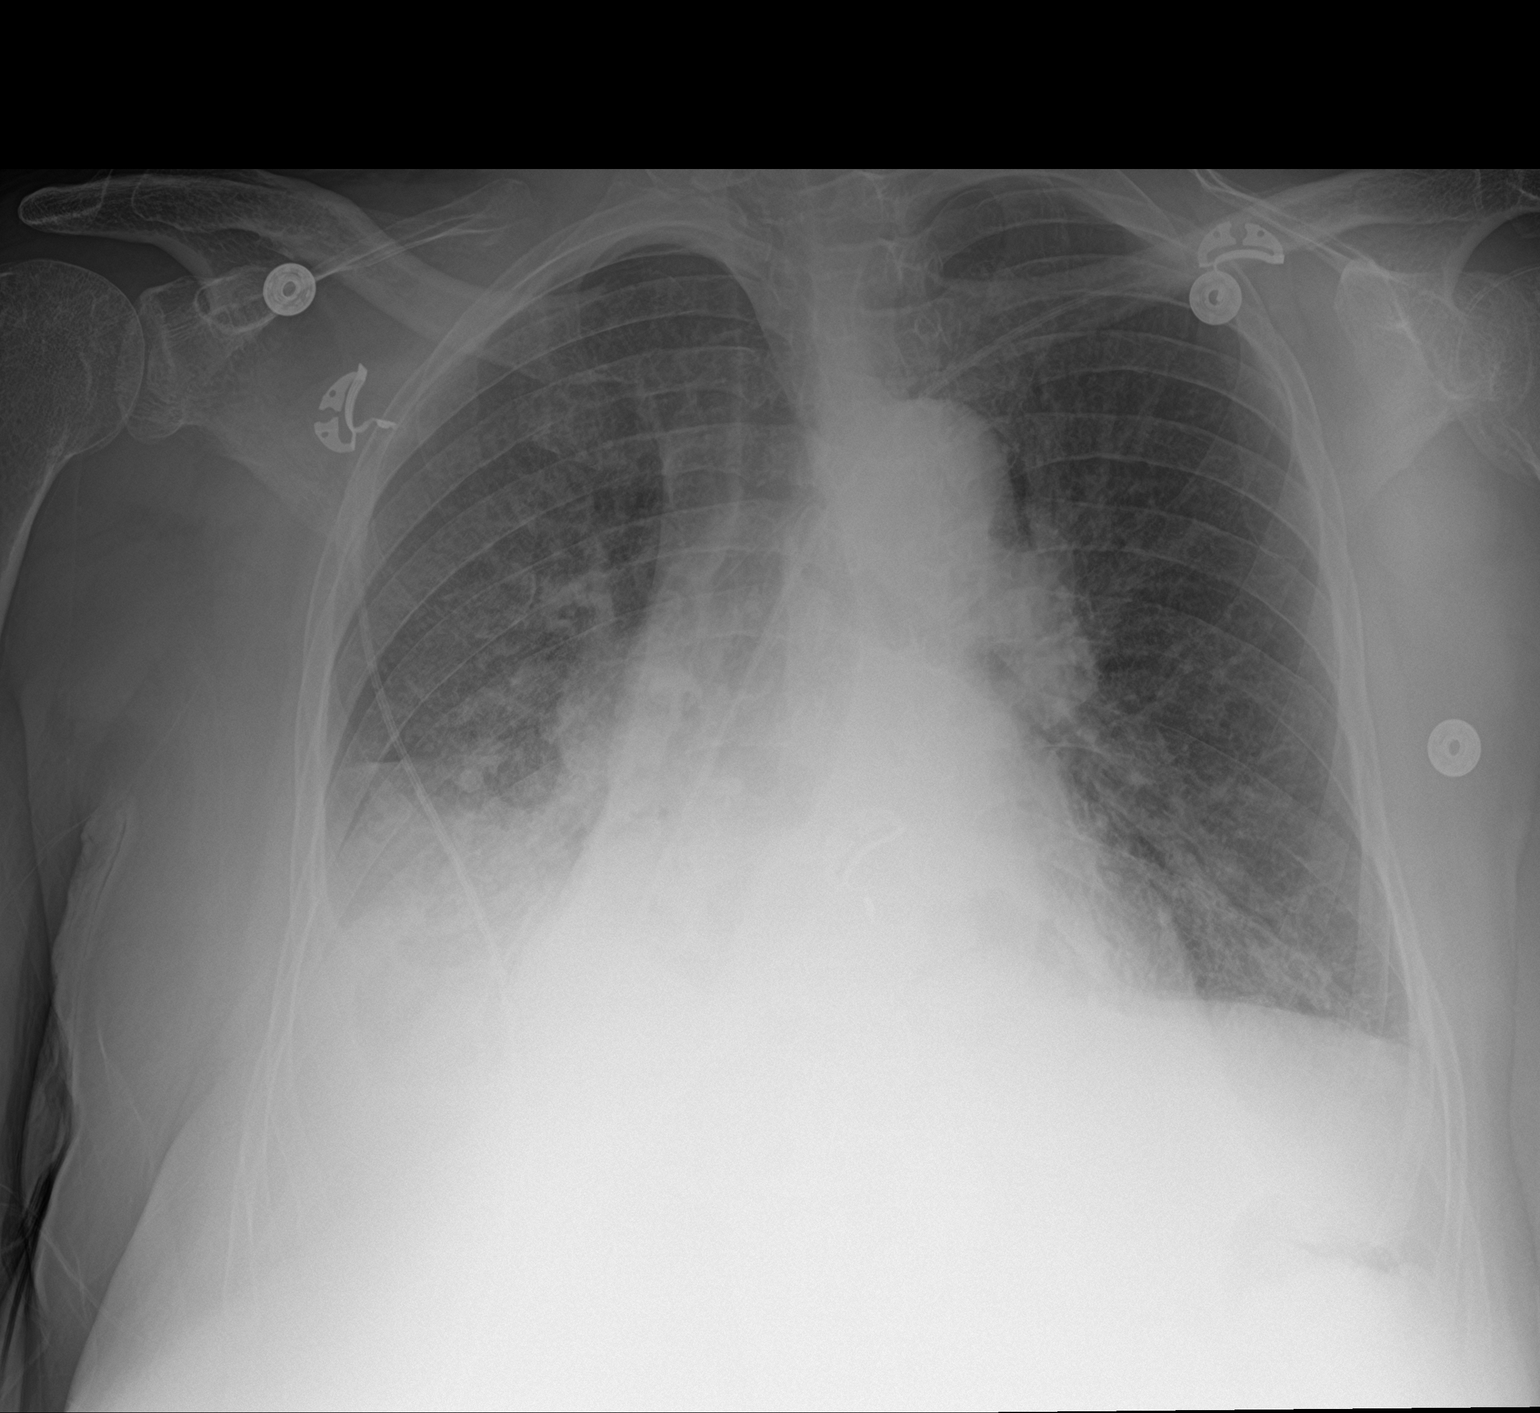

[2 of 2 positions shown; findings below may reference images not displayed]

FINDINGS: The patient is rotated to the right. The cardiomediastinal
silhouette is unchanged. Patchy consolidation in the right middle
and lower lobes with adjacent small pleural effusion, similar to
prior study. Trace left pleural effusion is unchanged. No
pneumothorax. No acute osseous abnormality.
IMPRESSION: Right middle and lower lobe pneumonia with adjacent small pleural
effusion, similar to prior study.

## 2017-12-18 DIAGNOSIS — N186 End stage renal disease: Secondary | ICD-10-CM | POA: Diagnosis not present

## 2017-12-18 DIAGNOSIS — N2581 Secondary hyperparathyroidism of renal origin: Secondary | ICD-10-CM | POA: Diagnosis not present

## 2017-12-18 IMAGING — DX DG CHEST 1V PORT
1 series · 1 of 1 positions shown · non-contrast
Comparison: 03/11/2017

CLINICAL DATA: Hypoxia

EXAM:
PORTABLE CHEST 1 VIEW

[chest]
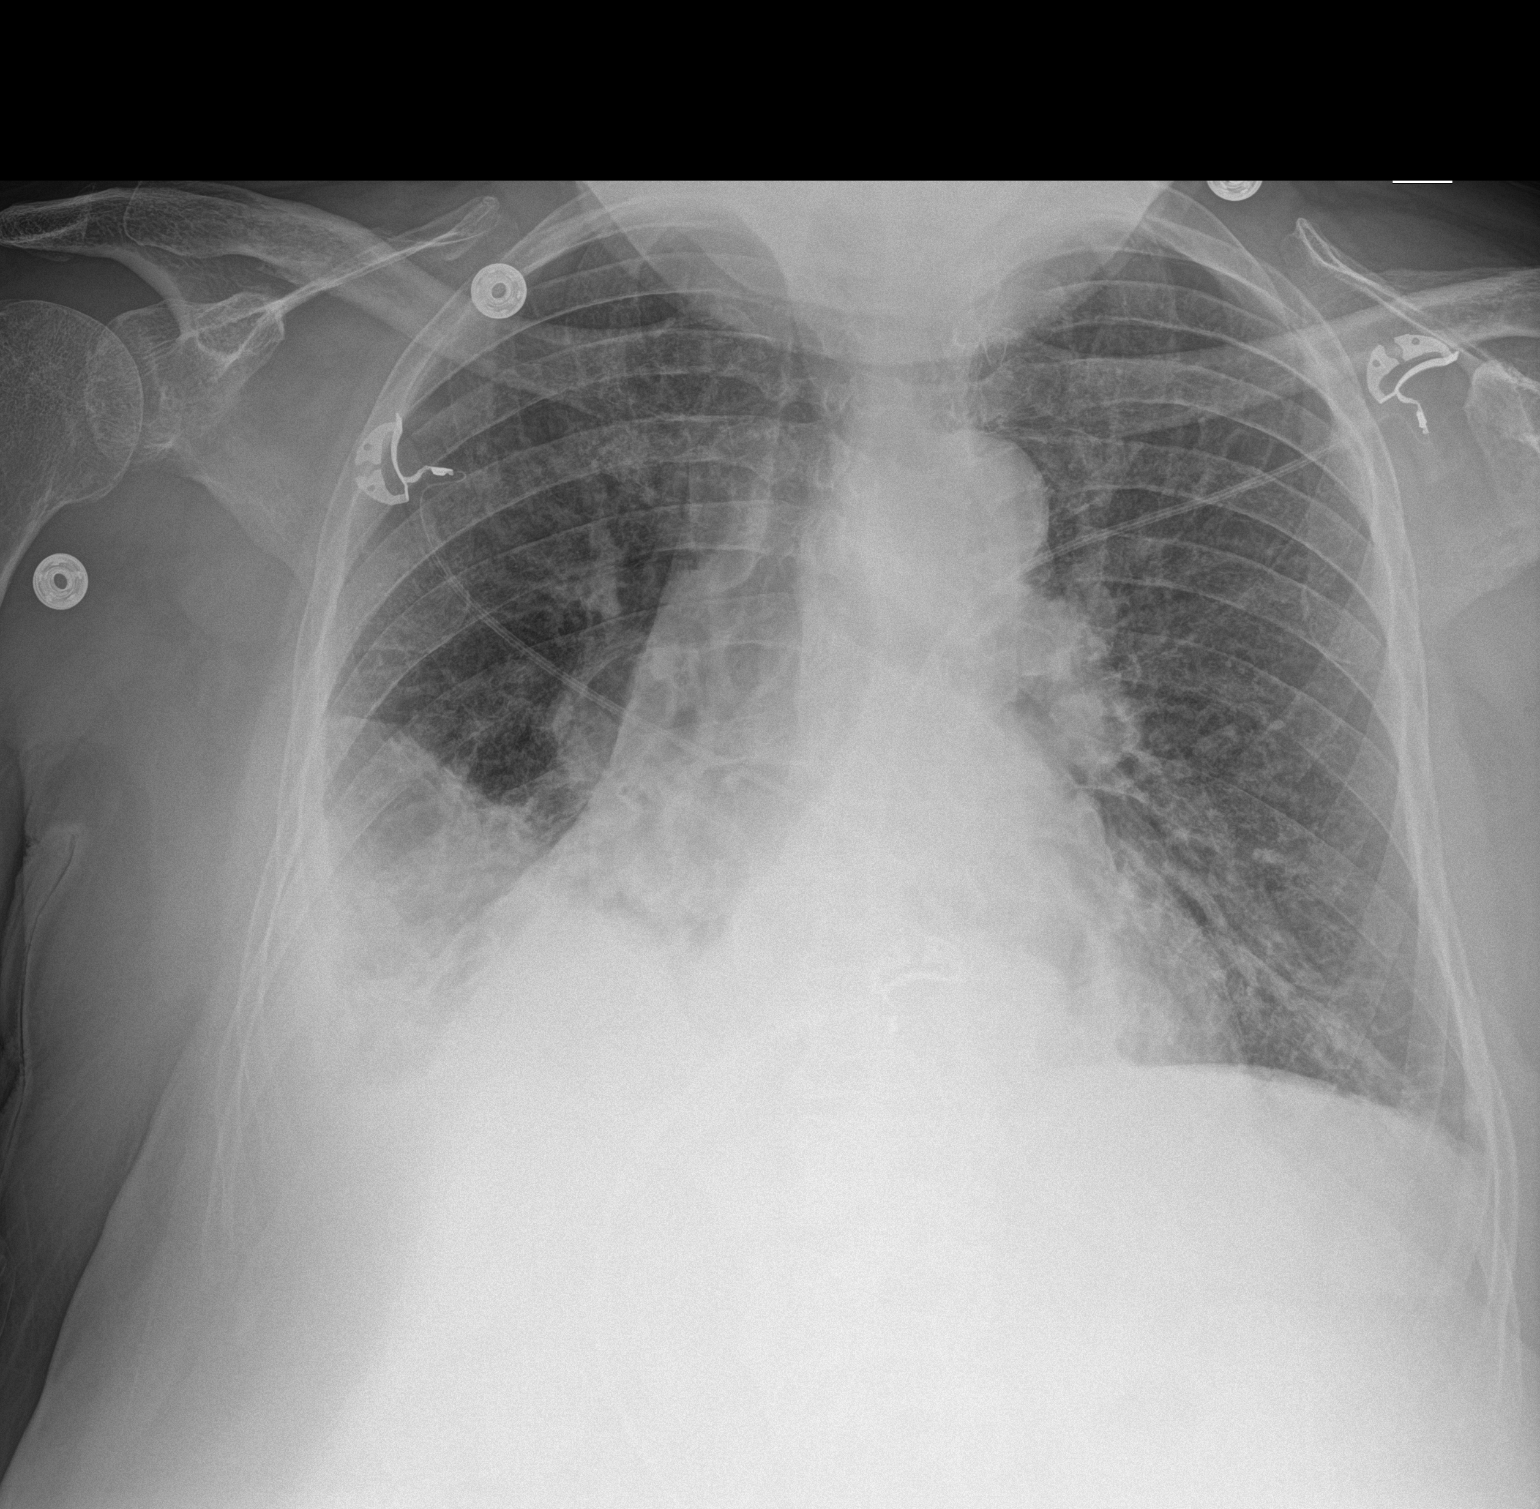

[1 of 1 positions shown; findings below may reference images not displayed]

FINDINGS: Right basilar and right middle lobe airspace disease with small
right effusion unchanged. Left lung remains clear. Negative for
heart failure.
IMPRESSION: Right basilar airspace disease and right effusion unchanged. No new
findings.

## 2017-12-18 IMAGING — US IR CHEST US
1 series · 3 of 3 positions shown · non-contrast
Comparison: None.

CLINICAL DATA: 71-year-old female with a possible right-sided
pleural effusion.

EXAM:
CHEST ULTRASOUND

[Series 1: ir (id) (id)/(id)/(id) ir · 3 of 3 slices shown]
[im 1/3]
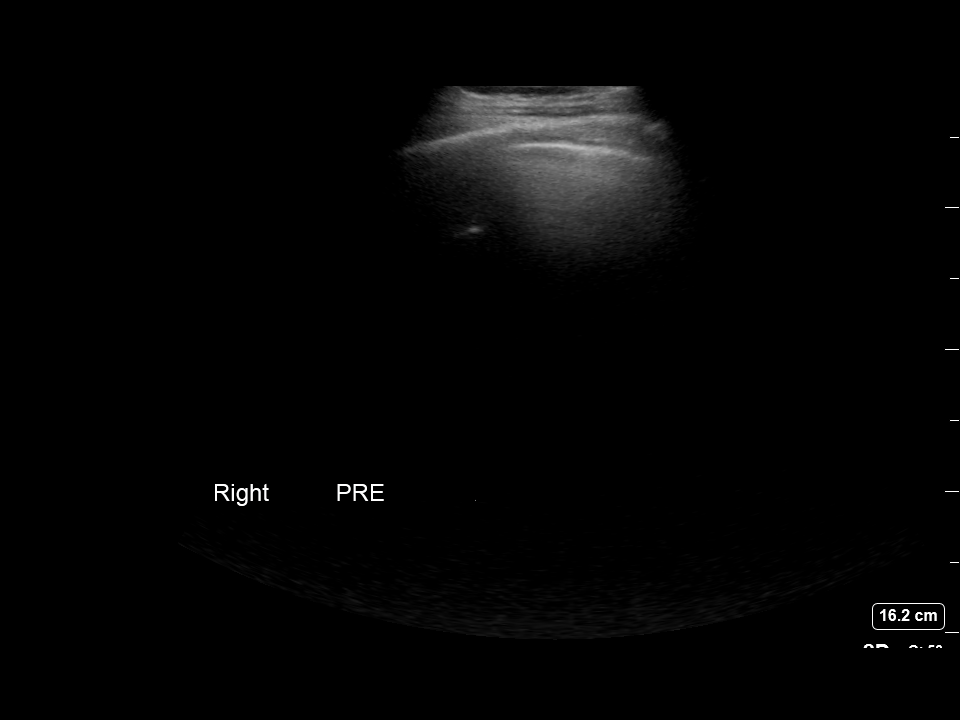
[im 2/3]
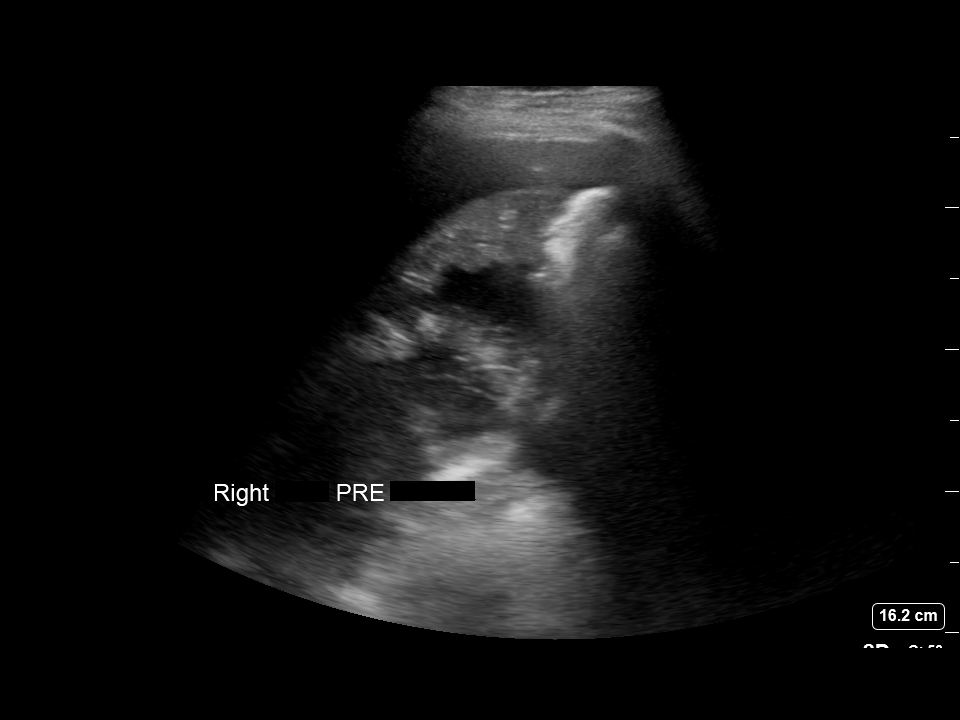
[im 3/3]
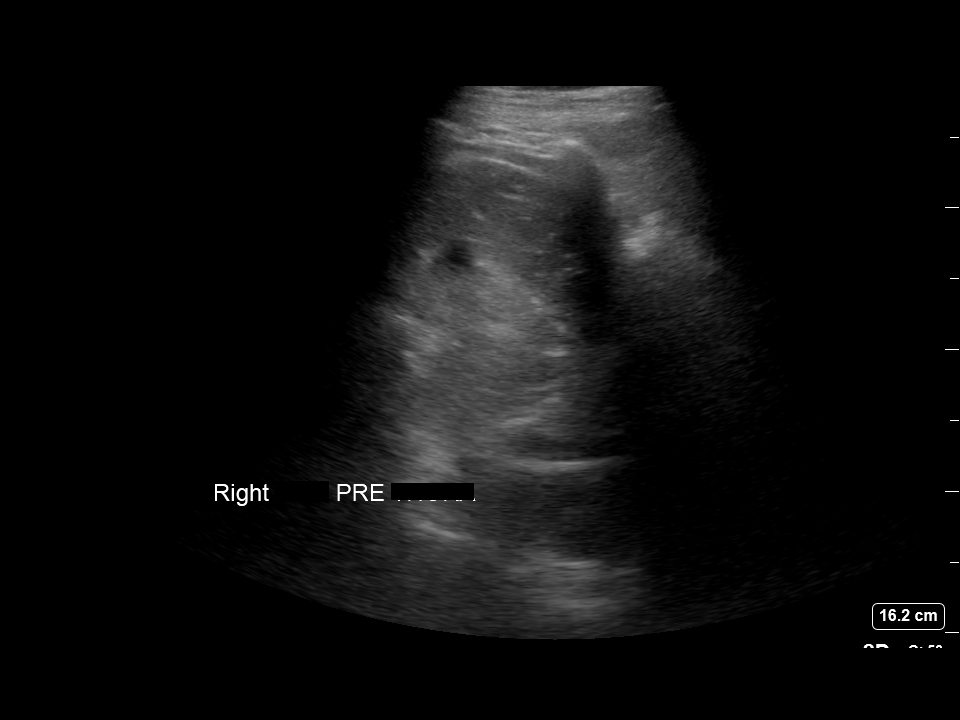

[3 of 3 positions shown; findings below may reference images not displayed]

FINDINGS: The right chest was interrogated with ultrasound. There is a very
small right-sided pleural effusion. The volume of fluid is
insufficient to allow for thoracentesis.
IMPRESSION: Small right-sided pleural effusion. Insufficient fluid volume for
thoracentesis.

## 2017-12-20 DIAGNOSIS — N2581 Secondary hyperparathyroidism of renal origin: Secondary | ICD-10-CM | POA: Diagnosis not present

## 2017-12-20 DIAGNOSIS — N186 End stage renal disease: Secondary | ICD-10-CM | POA: Diagnosis not present

## 2017-12-22 DIAGNOSIS — N186 End stage renal disease: Secondary | ICD-10-CM | POA: Diagnosis not present

## 2017-12-22 DIAGNOSIS — Z992 Dependence on renal dialysis: Secondary | ICD-10-CM | POA: Diagnosis not present

## 2017-12-22 DIAGNOSIS — N2581 Secondary hyperparathyroidism of renal origin: Secondary | ICD-10-CM | POA: Diagnosis not present

## 2017-12-22 DIAGNOSIS — E1129 Type 2 diabetes mellitus with other diabetic kidney complication: Secondary | ICD-10-CM | POA: Diagnosis not present

## 2017-12-25 DIAGNOSIS — N186 End stage renal disease: Secondary | ICD-10-CM | POA: Diagnosis not present

## 2017-12-25 DIAGNOSIS — N2581 Secondary hyperparathyroidism of renal origin: Secondary | ICD-10-CM | POA: Diagnosis not present

## 2017-12-25 DIAGNOSIS — E8770 Fluid overload, unspecified: Secondary | ICD-10-CM | POA: Diagnosis not present

## 2017-12-26 DIAGNOSIS — E8770 Fluid overload, unspecified: Secondary | ICD-10-CM | POA: Diagnosis not present

## 2017-12-26 DIAGNOSIS — N186 End stage renal disease: Secondary | ICD-10-CM | POA: Diagnosis not present

## 2017-12-26 DIAGNOSIS — N2581 Secondary hyperparathyroidism of renal origin: Secondary | ICD-10-CM | POA: Diagnosis not present

## 2017-12-27 DIAGNOSIS — E8770 Fluid overload, unspecified: Secondary | ICD-10-CM | POA: Diagnosis not present

## 2017-12-27 DIAGNOSIS — N2581 Secondary hyperparathyroidism of renal origin: Secondary | ICD-10-CM | POA: Diagnosis not present

## 2017-12-27 DIAGNOSIS — N186 End stage renal disease: Secondary | ICD-10-CM | POA: Diagnosis not present

## 2017-12-28 DIAGNOSIS — I871 Compression of vein: Secondary | ICD-10-CM | POA: Diagnosis not present

## 2017-12-28 DIAGNOSIS — Z992 Dependence on renal dialysis: Secondary | ICD-10-CM | POA: Diagnosis not present

## 2017-12-28 DIAGNOSIS — T82858A Stenosis of vascular prosthetic devices, implants and grafts, initial encounter: Secondary | ICD-10-CM | POA: Diagnosis not present

## 2017-12-28 DIAGNOSIS — N186 End stage renal disease: Secondary | ICD-10-CM | POA: Diagnosis not present

## 2017-12-29 DIAGNOSIS — N2581 Secondary hyperparathyroidism of renal origin: Secondary | ICD-10-CM | POA: Diagnosis not present

## 2017-12-29 DIAGNOSIS — N186 End stage renal disease: Secondary | ICD-10-CM | POA: Diagnosis not present

## 2017-12-29 DIAGNOSIS — E8770 Fluid overload, unspecified: Secondary | ICD-10-CM | POA: Diagnosis not present

## 2018-01-01 DIAGNOSIS — N2581 Secondary hyperparathyroidism of renal origin: Secondary | ICD-10-CM | POA: Diagnosis not present

## 2018-01-01 DIAGNOSIS — N186 End stage renal disease: Secondary | ICD-10-CM | POA: Diagnosis not present

## 2018-01-01 DIAGNOSIS — E8770 Fluid overload, unspecified: Secondary | ICD-10-CM | POA: Diagnosis not present

## 2018-01-03 DIAGNOSIS — N186 End stage renal disease: Secondary | ICD-10-CM | POA: Diagnosis not present

## 2018-01-03 DIAGNOSIS — N2581 Secondary hyperparathyroidism of renal origin: Secondary | ICD-10-CM | POA: Diagnosis not present

## 2018-01-03 DIAGNOSIS — E8770 Fluid overload, unspecified: Secondary | ICD-10-CM | POA: Diagnosis not present

## 2018-01-05 DIAGNOSIS — N2581 Secondary hyperparathyroidism of renal origin: Secondary | ICD-10-CM | POA: Diagnosis not present

## 2018-01-05 DIAGNOSIS — E8770 Fluid overload, unspecified: Secondary | ICD-10-CM | POA: Diagnosis not present

## 2018-01-05 DIAGNOSIS — N186 End stage renal disease: Secondary | ICD-10-CM | POA: Diagnosis not present

## 2018-01-07 ENCOUNTER — Other Ambulatory Visit: Payer: Self-pay | Admitting: Nurse Practitioner

## 2018-01-07 DIAGNOSIS — I639 Cerebral infarction, unspecified: Secondary | ICD-10-CM

## 2018-01-08 DIAGNOSIS — N186 End stage renal disease: Secondary | ICD-10-CM | POA: Diagnosis not present

## 2018-01-08 DIAGNOSIS — E8770 Fluid overload, unspecified: Secondary | ICD-10-CM | POA: Diagnosis not present

## 2018-01-08 DIAGNOSIS — N2581 Secondary hyperparathyroidism of renal origin: Secondary | ICD-10-CM | POA: Diagnosis not present

## 2018-01-10 DIAGNOSIS — E8770 Fluid overload, unspecified: Secondary | ICD-10-CM | POA: Diagnosis not present

## 2018-01-10 DIAGNOSIS — N186 End stage renal disease: Secondary | ICD-10-CM | POA: Diagnosis not present

## 2018-01-10 DIAGNOSIS — N2581 Secondary hyperparathyroidism of renal origin: Secondary | ICD-10-CM | POA: Diagnosis not present

## 2018-01-12 DIAGNOSIS — E8770 Fluid overload, unspecified: Secondary | ICD-10-CM | POA: Diagnosis not present

## 2018-01-12 DIAGNOSIS — N186 End stage renal disease: Secondary | ICD-10-CM | POA: Diagnosis not present

## 2018-01-12 DIAGNOSIS — N2581 Secondary hyperparathyroidism of renal origin: Secondary | ICD-10-CM | POA: Diagnosis not present

## 2018-01-15 DIAGNOSIS — N186 End stage renal disease: Secondary | ICD-10-CM | POA: Diagnosis not present

## 2018-01-15 DIAGNOSIS — E8770 Fluid overload, unspecified: Secondary | ICD-10-CM | POA: Diagnosis not present

## 2018-01-15 DIAGNOSIS — N2581 Secondary hyperparathyroidism of renal origin: Secondary | ICD-10-CM | POA: Diagnosis not present

## 2018-01-17 DIAGNOSIS — N2581 Secondary hyperparathyroidism of renal origin: Secondary | ICD-10-CM | POA: Diagnosis not present

## 2018-01-17 DIAGNOSIS — N186 End stage renal disease: Secondary | ICD-10-CM | POA: Diagnosis not present

## 2018-01-17 DIAGNOSIS — E8770 Fluid overload, unspecified: Secondary | ICD-10-CM | POA: Diagnosis not present

## 2018-01-18 ENCOUNTER — Ambulatory Visit: Payer: Medicare HMO

## 2018-01-19 DIAGNOSIS — N186 End stage renal disease: Secondary | ICD-10-CM | POA: Diagnosis not present

## 2018-01-19 DIAGNOSIS — N2581 Secondary hyperparathyroidism of renal origin: Secondary | ICD-10-CM | POA: Diagnosis not present

## 2018-01-19 DIAGNOSIS — E8770 Fluid overload, unspecified: Secondary | ICD-10-CM | POA: Diagnosis not present

## 2018-01-21 ENCOUNTER — Ambulatory Visit: Payer: Medicare HMO | Admitting: Nurse Practitioner

## 2018-01-21 ENCOUNTER — Ambulatory Visit: Payer: Self-pay | Admitting: Internal Medicine

## 2018-01-21 DIAGNOSIS — E1129 Type 2 diabetes mellitus with other diabetic kidney complication: Secondary | ICD-10-CM | POA: Diagnosis not present

## 2018-01-21 DIAGNOSIS — N186 End stage renal disease: Secondary | ICD-10-CM | POA: Diagnosis not present

## 2018-01-21 DIAGNOSIS — Z992 Dependence on renal dialysis: Secondary | ICD-10-CM | POA: Diagnosis not present

## 2018-01-22 DIAGNOSIS — N2581 Secondary hyperparathyroidism of renal origin: Secondary | ICD-10-CM | POA: Diagnosis not present

## 2018-01-22 DIAGNOSIS — N186 End stage renal disease: Secondary | ICD-10-CM | POA: Diagnosis not present

## 2018-01-22 LAB — HEMOGLOBIN A1C: Hemoglobin A1C: 5.5

## 2018-01-22 LAB — CBC AND DIFFERENTIAL: Hemoglobin: 11.6 — AB (ref 12.0–16.0)

## 2018-01-24 ENCOUNTER — Other Ambulatory Visit: Payer: Self-pay | Admitting: Nurse Practitioner

## 2018-01-24 DIAGNOSIS — N2581 Secondary hyperparathyroidism of renal origin: Secondary | ICD-10-CM | POA: Diagnosis not present

## 2018-01-24 DIAGNOSIS — F418 Other specified anxiety disorders: Secondary | ICD-10-CM

## 2018-01-24 DIAGNOSIS — N186 End stage renal disease: Secondary | ICD-10-CM | POA: Diagnosis not present

## 2018-01-24 NOTE — Telephone Encounter (Signed)
A medication refill was received from pharmacy for paxil 40 mg. Request has been pended to provider for approval due to several recent cancellations and no shows. Pt is scheduled for an appointment on 03/01/18.

## 2018-01-26 DIAGNOSIS — N186 End stage renal disease: Secondary | ICD-10-CM | POA: Diagnosis not present

## 2018-01-26 DIAGNOSIS — N2581 Secondary hyperparathyroidism of renal origin: Secondary | ICD-10-CM | POA: Diagnosis not present

## 2018-01-29 DIAGNOSIS — N186 End stage renal disease: Secondary | ICD-10-CM | POA: Diagnosis not present

## 2018-01-29 DIAGNOSIS — N2581 Secondary hyperparathyroidism of renal origin: Secondary | ICD-10-CM | POA: Diagnosis not present

## 2018-01-30 ENCOUNTER — Ambulatory Visit (INDEPENDENT_AMBULATORY_CARE_PROVIDER_SITE_OTHER): Payer: Medicare HMO

## 2018-01-30 VITALS — BP 145/70 | HR 100 | Temp 97.6°F | Ht 65.0 in | Wt 177.0 lb

## 2018-01-30 DIAGNOSIS — K219 Gastro-esophageal reflux disease without esophagitis: Secondary | ICD-10-CM

## 2018-01-30 DIAGNOSIS — Z0000 Encounter for general adult medical examination without abnormal findings: Secondary | ICD-10-CM | POA: Diagnosis not present

## 2018-01-30 DIAGNOSIS — I639 Cerebral infarction, unspecified: Secondary | ICD-10-CM

## 2018-01-30 MED ORDER — CLOPIDOGREL BISULFATE 75 MG PO TABS: 75.0000 mg | ORAL_TABLET | Freq: Every day | ORAL | 0 refills | Status: DC

## 2018-01-30 MED ORDER — TRIAMCINOLONE ACETONIDE 0.1 % EX CREA: 1.0000 "application " | TOPICAL_CREAM | Freq: Three times a day (TID) | CUTANEOUS | 0 refills | Status: DC | PRN

## 2018-01-30 MED ORDER — ZOSTER VAC RECOMB ADJUVANTED 50 MCG/0.5ML IM SUSR: 0.5000 mL | Freq: Once | INTRAMUSCULAR | 1 refills | Status: AC

## 2018-01-30 MED ORDER — PANTOPRAZOLE SODIUM 40 MG PO TBEC: 40.0000 mg | DELAYED_RELEASE_TABLET | Freq: Every day | ORAL | 6 refills | Status: DC

## 2018-01-30 NOTE — Patient Instructions (Addendum)
[name] , Thank you for taking time to come for your Medicare Wellness Visit. I appreciate your ongoing commitment to your health goals. Please review the following plan we discussed and let me know if I can assist you in the future.   Screening recommendations/referrals: Colonoscopy up to date, due 10/16/2020 Mammogram up to date, due 09/15/2019 Bone Density up to date Recommended yearly ophthalmology/optometry visit for glaucoma screening and checkup Recommended yearly dental visit for hygiene and checkup  Vaccinations: Influenza vaccine up to date, due 2020 fall season Pneumococcal vaccine up to date, completed Tdap vaccine up to date, due 11/24/2018 Shingles vaccine due    Advanced directives: in chart  Conditions/risks identified: none  Next appointment: [name], NP 03/01/2018 @ 8:30am   Preventive Care 72 Years and Older, Female Preventive care refers to lifestyle choices and visits with your health care provider that can promote health and wellness. What does preventive care include?  A yearly physical exam. This is also called an annual well check.  Dental exams once or twice a year.  Routine eye exams. Ask your health care provider how often you should have your eyes checked.  Personal lifestyle choices, including:  Daily care of your teeth and gums.  Regular physical activity.  Eating a healthy diet.  Avoiding tobacco and drug use.  Limiting alcohol use.  Practicing safe sex.  Taking low-dose aspirin every day.  Taking vitamin and mineral supplements as recommended by your health care provider. What happens during an annual well check? The services and screenings done by your health care provider during your annual well check will depend on your age, overall health, lifestyle risk factors, and family history of disease. Counseling  Your health care provider may ask you questions about your:  Alcohol use.  Tobacco use.  Drug use.  Emotional  well-being.  Home and relationship well-being.  Sexual activity.  Eating habits.  History of falls.  Memory and ability to understand (cognition).  Work and work Statistician.  Reproductive health. Screening  You may have the following tests or measurements:  Height, weight, and BMI.  Blood pressure.  Lipid and cholesterol levels. These may be checked every 5 years, or more frequently if you are over 72 years old.  Skin check.  Lung cancer screening. You may have this screening every year starting at age 72 if you have a 30-pack-year history of smoking and currently smoke or have quit within the past 15 years.  Fecal occult blood test (FOBT) of the stool. You may have this test every year starting at age 72.  Flexible sigmoidoscopy or colonoscopy. You may have a sigmoidoscopy every 5 years or a colonoscopy every 10 years starting at age 72.  Hepatitis C blood test.  Hepatitis B blood test.  Sexually transmitted disease (STD) testing.  Diabetes screening. This is done by checking your blood sugar (glucose) after you have not eaten for a while (fasting). You may have this done every 1-3 years.  Bone density scan. This is done to screen for osteoporosis. You may have this done starting at age 72.  Mammogram. This may be done every 1-2 years. Talk to your health care provider about how often you should have regular mammograms. Talk with your health care provider about your test results, treatment options, and if necessary, the need for more tests. Vaccines  Your health care provider may recommend certain vaccines, such as:  Influenza vaccine. This is recommended every year.  Tetanus, diphtheria, and acellular pertussis (  Tdap, Td) vaccine. You may need a Td booster every 10 years.  Zoster vaccine. You may need this after age 13.  Pneumococcal 13-valent conjugate (PCV13) vaccine. One dose is recommended after age 72.  Pneumococcal polysaccharide (PPSV23) vaccine. One  dose is recommended after age 76. Talk to your health care provider about which screenings and vaccines you need and how often you need them. This information is not intended to replace advice given to you by your health care provider. Make sure you discuss any questions you have with your health care provider. Document Released: 05/07/2015 Document Revised: 12/29/2015 Document Reviewed: 02/09/2015 Elsevier Interactive Patient Education  2017 [place] Prevention in the Home Falls can cause injuries. They can happen to people of all ages. There are many things you can do to make your home safe and to help prevent falls. What can I do on the outside of my home?  Regularly fix the edges of walkways and driveways and fix any cracks.  Remove anything that might make you trip as you walk through a door, such as a raised step or threshold.  Trim any bushes or trees on the path to your home.  Use bright outdoor lighting.  Clear any walking paths of anything that might make someone trip, such as rocks or tools.  Regularly check to see if handrails are loose or broken. Make sure that both sides of any steps have handrails.  Any raised decks and porches should have guardrails on the edges.  Have any leaves, snow, or ice cleared regularly.  Use sand or salt on walking paths during winter.  Clean up any spills in your garage right away. This includes oil or grease spills. What can I do in the bathroom?  Use night lights.  Install grab bars by the toilet and in the tub and shower. Do not use towel bars as grab bars.  Use non-skid mats or decals in the tub or shower.  If you need to sit down in the shower, use a plastic, non-slip stool.  Keep the floor dry. Clean up any water that spills on the floor as soon as it happens.  Remove soap buildup in the tub or shower regularly.  Attach bath mats securely with double-sided non-slip rug tape.  Do not have throw rugs and other  things on the floor that can make you trip. What can I do in the bedroom?  Use night lights.  Make sure that you have a light by your bed that is easy to reach.  Do not use any sheets or blankets that are too big for your bed. They should not hang down onto the floor.  Have a firm chair that has side arms. You can use this for support while you get dressed.  Do not have throw rugs and other things on the floor that can make you trip. What can I do in the kitchen?  Clean up any spills right away.  Avoid walking on wet floors.  Keep items that you use a lot in easy-to-reach places.  If you need to reach something above you, use a strong step stool that has a grab bar.  Keep electrical cords out of the way.  Do not use floor polish or wax that makes floors slippery. If you must use wax, use non-skid floor wax.  Do not have throw rugs and other things on the floor that can make you trip. What can I do with my stairs?  Do  not leave any items on the stairs.  Make sure that there are handrails on both sides of the stairs and use them. Fix handrails that are broken or loose. Make sure that handrails are as long as the stairways.  Check any carpeting to make sure that it is firmly attached to the stairs. Fix any carpet that is loose or worn.  Avoid having throw rugs at the top or bottom of the stairs. If you do have throw rugs, attach them to the floor with carpet tape.  Make sure that you have a light switch at the top of the stairs and the bottom of the stairs. If you do not have them, ask someone to add them for you. What else can I do to help prevent falls?  Wear shoes that:  Do not have high heels.  Have rubber bottoms.  Are comfortable and fit you well.  Are closed at the toe. Do not wear sandals.  If you use a stepladder:  Make sure that it is fully opened. Do not climb a closed stepladder.  Make sure that both sides of the stepladder are locked into place.  Ask  someone to hold it for you, if possible.  Clearly mark and make sure that you can see:  Any grab bars or handrails.  First and last steps.  Where the edge of each step is.  Use tools that help you move around (mobility aids) if they are needed. These include:  Canes.  Walkers.  Scooters.  Crutches.  Turn on the lights when you go into a dark area. Replace any light bulbs as soon as they burn out.  Set up your furniture so you have a clear path. Avoid moving your furniture around.  If any of your floors are uneven, fix them.  If there are any pets around you, be aware of where they are.  Review your medicines with your doctor. Some medicines can make you feel dizzy. This can increase your chance of falling. Ask your doctor what other things that you can do to help prevent falls. This information is not intended to replace advice given to you by your health care provider. Make sure you discuss any questions you have with your health care provider. Document Released: 02/04/2009 Document Revised: 09/16/2015 Document Reviewed: 05/15/2014 Elsevier Interactive Patient Education  2017 [employer].

## 2018-01-30 NOTE — Progress Notes (Signed)
Subjective:   [name] is a 72 y.o. female who presents for Medicare Annual (Subsequent) preventive examination  Last AWV-01/08/2017    Objective:     Vitals: BP (!) 145/70 (BP Location: Right Arm, Patient Position: Sitting)   Pulse 100   Temp 97.6 F (36.4 C) (Oral)   Ht 5\' 5"  (1.651 m)   Wt 177 lb (80.3 kg)   BMI 29.45 kg/m   Body mass index is 29.45 kg/m.  Advanced Directives 01/30/2018 06/25/2017 05/06/2017 05/05/2017 03/10/2017 02/01/2017 01/08/2017  Does Patient Have a Medical Advance Directive? Yes Yes No No No Yes Yes  Type of Advance Directive Out of facility DNR (pink MOST or yellow form) Healthcare Power of [place] of facility DNR (pink MOST or yellow form)  Does patient want to make changes to medical advance directive? No - Patient declined - - - - - -  Would patient like information on creating a medical advance directive? - - No - Patient declined - No - Patient declined - -  Pre-existing out of facility DNR order (yellow form or pink MOST form) Yellow form placed in chart (order not valid for inpatient use) - - - - - Yellow form placed in chart (order not valid for inpatient use)    Tobacco Social History   Tobacco Use  Smoking Status Current Every Day Smoker  . Packs/day: 1.00  . Years: 40.00  . Pack years: 40.00  . Types: Cigarettes  Smokeless Tobacco Never Used  Tobacco Comment   smokes 1 pack of cigerettes a week- off and on     Ready to quit: Not Answered Counseling given: Not Answered Comment: smokes 1 pack of cigerettes a week- off and on   Clinical Intake:  Pre-visit preparation completed: No  Pain : No/denies pain     Diabetes: Yes CBG done?: No Did pt. bring in CBG monitor from home?: No  How often do you need to have someone help you when you read instructions, pamphlets, or other written materials from your doctor or pharmacy?: 2 - Rarely  Interpreter Needed?: No  Information entered by  :: Tyson Dense, RN  Past Medical History:  Diagnosis Date  . Anemia   . Anxiety   . Arthritis    lower back and knees   . Asthma    childhood  . Barrett's esophagus   . Chronic kidney disease   . Coagulation defect ([place])   . Depression   . Diabetes mellitus   . Dysphagia   . Eczema   . End stage renal disease ([place])   . Fluid overload, unspecified   . History of herpes zoster 02/2010   Recovered fully after period of acute herpetic neuralgia tx w/ gabapentin.   Marland Kitchen Hx MRSA infection 2009  . Hypercalcemia   . Hyperlipidemia   . Hypertension   . Moderate protein-calorie malnutrition ([place])   . Neuromuscular disorder ([facility])    chronic pain  . Personal history of colonic polyps 10/17/2010   hyperplastic   Past Surgical History:  Procedure Laterality Date  . ABDOMINAL HYSTERECTOMY     unclear when  . AV FISTULA PLACEMENT Left 02/13/2017   Procedure: ARTERIOVENOUS (AV) GORE-TEX STRETCH GRAFT INSERTION INTO LEFT ARM;  Surgeon: Elam Dutch, MD;  Location: [place] [facility];  Service: Vascular;  Laterality: Left;  . CATARACT EXTRACTION Bilateral   . thigh surg     left side due to MRSA   Family  History  Problem Relation Age of Onset  . Hypertension Father   . Kidney disease Father   . Diabetes Brother   . Colon cancer Neg Hx   . CAD Neg Hx    Social History   Socioeconomic History  . Marital status: Divorced    Spouse name: Not on file  . Number of children: 0  . Years of education: Not on file  . Highest education level: Not on file  Occupational History  . Occupation: Retired    Fish farm manager: SELF-EMPLOYED  Social Needs  . Financial resource strain: Not hard at all  . Food insecurity:    Worry: Never true    Inability: Never true  . Transportation needs:    Medical: No    Non-medical: No  Tobacco Use  . Smoking status: Current Every Day Smoker    Packs/day: 1.00    Years: 40.00    Pack years: 40.00    Types: Cigarettes  . Smokeless tobacco: Never Used  . Tobacco  comment: smokes 1 pack of cigerettes a week- off and on  Substance and Sexual Activity  . Alcohol use: Yes    Alcohol/week: 2.0 standard drinks    Types: 2 Standard drinks or equivalent per week    Comment: wine and beer- occasionally   . Drug use: No  . Sexual activity: Never  Lifestyle  . Physical activity:    Days per week: 7 days    Minutes per session: 30 min  . Stress: To some extent  Relationships  . Social connections:    Talks on phone: More than three times a week    Gets together: More than three times a week    Attends religious service: Never    Active member of club or organization: No    Attends meetings of clubs or organizations: Never    Relationship status: Divorced  Other Topics Concern  . Not on file  Social History Narrative   Financial assistance approved for 100% discount at [facility] and has [facility] card per Bonna Gains   02/10/2010      3 caffeine drinks daily     Outpatient Encounter Medications as of 01/30/2018  Medication Sig  . aspirin 81 MG chewable tablet Chew 1 tablet (81 mg total) by mouth daily.  . calcitRIOL (ROCALTROL) 0.25 MCG capsule Take 1 capsule (0.25 mcg total) by mouth Every Tuesday,Thursday,and Saturday with dialysis.  Marland Kitchen calcium acetate (PHOSLO) 667 MG capsule Take by mouth 3 (three) times daily with meals.  . cinacalcet (SENSIPAR) 30 MG tablet Take 30 mg by mouth daily.  . clopidogrel (PLAVIX) 75 MG tablet Take 1 tablet (75 mg total) by mouth daily. Do not use blister packs  . ezetimibe (ZETIA) 10 MG tablet TAKE 1 TABLET BY MOUTH EVERY DAY  . latanoprost (XALATAN) 0.005 % ophthalmic solution Place 1 drop into both eyes at bedtime. Reported on 08/27/2015  . pantoprazole (PROTONIX) 40 MG tablet Take 1 tablet (40 mg total) by mouth daily.  Marland Kitchen PARoxetine (PAXIL) 40 MG tablet TAKE 1 TABLET (40 MG TOTAL) BY MOUTH DAILY.  . pravastatin (PRAVACHOL) 40 MG tablet TAKE 1 TABLET (40 MG TOTAL) BY MOUTH AT BEDTIME.  . sevelamer carbonate (RENVELA) 2.4 g  PACK 2.4 g 3 (three) times daily with meals.  . SUMAtriptan (IMITREX) 25 MG tablet 1 tablet daily as needed for headache, May repeat in 2 hours if headache persists or recurs. Max of 2 tablets in 24 hours  . Zoster Vaccine Adjuvanted (  SHINGRIX) injection Inject 0.5 mLs into the muscle once for 1 dose.  . [DISCONTINUED] clopidogrel (PLAVIX) 75 MG tablet TAKE 1 TABLET (75 MG TOTAL) BY MOUTH DAILY. DO NOT USE BLISTER PACKS  . [DISCONTINUED] pantoprazole (PROTONIX) 40 MG tablet TAKE 1 TABLET (40 MG TOTAL) BY MOUTH DAILY.  . [DISCONTINUED] Zoster Vaccine Adjuvanted [facility]) injection Inject 0.5 mLs into the muscle once.  . ondansetron (ZOFRAN) 4 MG tablet On dialysis days as needed for nausea  . predniSONE (STERAPRED UNI-PAK 21 TAB) 10 MG (21) TBPK tablet Use as directed (Patient not taking: Reported on 01/30/2018)  . triamcinolone cream (KENALOG) 0.1 % Apply 1 application topically 3 (three) times daily as needed (rash).  . [DISCONTINUED] triamcinolone cream (KENALOG) 0.1 % Apply 1 application topically 3 (three) times daily as needed (rash).    No facility-administered encounter medications on file as of 01/30/2018.     Activities of Daily Living In your present state of health, do you have any difficulty performing the following activities: 01/30/2018 05/06/2017  Hearing? N N  Vision? N N  Difficulty concentrating or making decisions? Y N  Walking or climbing stairs? N Y  Dressing or bathing? N N  Doing errands, shopping? N N  Preparing Food and eating ? N -  Using the Toilet? N -  In the past six months, have you accidently leaked urine? N -  Do you have problems with loss of bowel control? N -  Managing your Medications? N -  Managing your Finances? N -  Housekeeping or managing your Housekeeping? N -  Some recent data might be hidden    Patient Care Team: [name], NP as PCP - General (Nurse Practitioner) [name], MD as Consulting Physician  (Ophthalmology) [name], MD as Consulting Physician (Nephrology) [name], NP as Nurse Practitioner (Nurse Practitioner)    Assessment:   This is a routine wellness examination for [place].  Exercise Activities and Dietary recommendations Current Exercise Habits: Home exercise routine, Type of exercise: walking, Time (Minutes): 30, Frequency (Times/Week): 7, Weekly Exercise (Minutes/Week): 210, Intensity: Mild, Exercise limited by: None identified  Goals    . Blood Pressure < 140/90    . HEMOGLOBIN A1C < 7.0    . Increase water intake- especially when blood sugars are high    . LDL CALC < 100       Fall Risk Fall Risk  01/30/2018 07/09/2017 06/29/2017 03/21/2017 01/08/2017  Falls in the past year? No No Yes Yes Yes  Number falls in past yr: - - 2 or more 2 or more 2 or more  Injury with Fall? - - No Yes No  Risk Factor Category  - - - - -  Risk for fall due to : - - - - -  Risk for fall due to: Comment - - - - -  Follow up - - - - -   Is the patient's home free of loose throw rugs in walkways, pet beds, electrical cords, etc?   yes      Grab bars in the bathroom? yes      Handrails on the stairs?   yes      Adequate lighting?   yes  Depression Screen PHQ 2/9 Scores 01/30/2018 01/08/2017 06/07/2016 01/06/2016  PHQ - 2 Score 1 2 0 3  PHQ- 9 Score - 5 - 8     Cognitive Function MMSE - Mini Mental State Exam 01/30/2018 01/08/2017 12/13/2016 05/23/2016 01/06/2016  Orientation to time 5  5 5 5 4   Orientation to Place 5 5 5 5 5   Registration 3 3 3 3 3   Attention/ Calculation 5 5 5 5 5   Recall 2 1 1 2 2   Language- name 2 objects 2 2 2 2 2   Language- repeat 1 1 1 1 1   Language- follow 3 step command 3 3 3 3 2   Language- read & follow direction 1 1 1 1 1   Write a sentence 1 1 1 1 1   Copy design 0 0 1 1 0  Total score 28 27 28 29 26         Immunization History  Administered Date(s) Administered  . Hepatitis B, adult 06/21/2017, 07/19/2017, 08/18/2017  .  Influenza Split 02/03/2011, 01/12/2012  . Influenza Whole 01/07/2010  . Influenza, High Dose Seasonal PF 01/08/2017  . Influenza,inj,Quad PF,6+ Mos 01/03/2013, 02/12/2014, 01/15/2015, 01/06/2016  . Pneumococcal Conjugate-13 04/22/2015  . Pneumococcal Polysaccharide-23 01/12/2012  . Td 11/23/2008  . Zoster 04/24/2014    Qualifies for Shingles Vaccine? Yes, educated and ordered to pharmacy  Screening Tests Health Maintenance  Topic Date Due  . OPHTHALMOLOGY EXAM  01/05/2017  . HEMOGLOBIN A1C  11/15/2017  . INFLUENZA VACCINE  11/22/2017  . FOOT EXAM  01/08/2018  . LIPID PANEL  07/03/2018  . TETANUS/TDAP  11/24/2018  . MAMMOGRAM  09/15/2019  . COLONOSCOPY  10/16/2020  . DEXA SCAN  Completed  . Hepatitis C Screening  Completed  . PNA vac Low Risk Adult  Completed    Cancer Screenings: Lung: Low Dose CT Chest recommended if Age 46-80 years, 30 pack-year currently smoking OR have quit w/in 15years. Patient does qualify. Breast:  Up to date on Mammogram? Yes   Up to date of Bone Density/Dexa? Yes Colorectal: up to date  Additional Screenings:  Hepatitis C Screening: declined     Plan:    I have personally reviewed and addressed the Medicare Annual Wellness questionnaire and have noted the following in the patient's chart:  A. Medical and social history B. Use of alcohol, tobacco or illicit drugs  C. Current medications and supplements D. Functional ability and status E.  Nutritional status F.  Physical activity G. Advance directives H. List of other physicians I.  Hospitalizations, surgeries, and ER visits in previous 12 months J.  [place] to include hearing, vision, cognitive, depression L. Referrals and appointments - none  In addition, I have reviewed and discussed with patient certain preventive protocols, quality metrics, and best practice recommendations. A written personalized care plan for preventive services as well as general preventive health  recommendations were provided to patient.  See attached scanned questionnaire for additional information.   Signed,   Tyson Dense, RN Nurse Health Advisor  Patient Concerns: None

## 2018-01-31 DIAGNOSIS — N186 End stage renal disease: Secondary | ICD-10-CM | POA: Diagnosis not present

## 2018-01-31 DIAGNOSIS — N2581 Secondary hyperparathyroidism of renal origin: Secondary | ICD-10-CM | POA: Diagnosis not present

## 2018-02-02 DIAGNOSIS — N2581 Secondary hyperparathyroidism of renal origin: Secondary | ICD-10-CM | POA: Diagnosis not present

## 2018-02-02 DIAGNOSIS — N186 End stage renal disease: Secondary | ICD-10-CM | POA: Diagnosis not present

## 2018-02-05 DIAGNOSIS — N186 End stage renal disease: Secondary | ICD-10-CM | POA: Diagnosis not present

## 2018-02-05 DIAGNOSIS — N2581 Secondary hyperparathyroidism of renal origin: Secondary | ICD-10-CM | POA: Diagnosis not present

## 2018-02-06 ENCOUNTER — Telehealth: Payer: Self-pay | Admitting: *Deleted

## 2018-02-06 NOTE — Telephone Encounter (Signed)
Received fax from CoverMyMeds [phone] for prior authorization for patient's Paroxetine HCL 40mg . Initiated through [facility]. Went into determination with a 48-72 hour determination.  Key [id number] PA Case ID: [id number] KP#:[id number]

## 2018-02-07 DIAGNOSIS — N2581 Secondary hyperparathyroidism of renal origin: Secondary | ICD-10-CM | POA: Diagnosis not present

## 2018-02-07 DIAGNOSIS — N186 End stage renal disease: Secondary | ICD-10-CM | POA: Diagnosis not present

## 2018-02-07 NOTE — Telephone Encounter (Signed)
Received letter from [facility] and Paroxetine is APPROVED through 04/24/2019. ID#: [id number]

## 2018-02-08 IMAGING — CR DG CHEST 2V
2 series · 2 of 2 positions shown · non-contrast
Comparison: March 14, 2017

CLINICAL DATA: Shortness of breath and cough this morning

EXAM:
CHEST  2 VIEW

[chest lat]
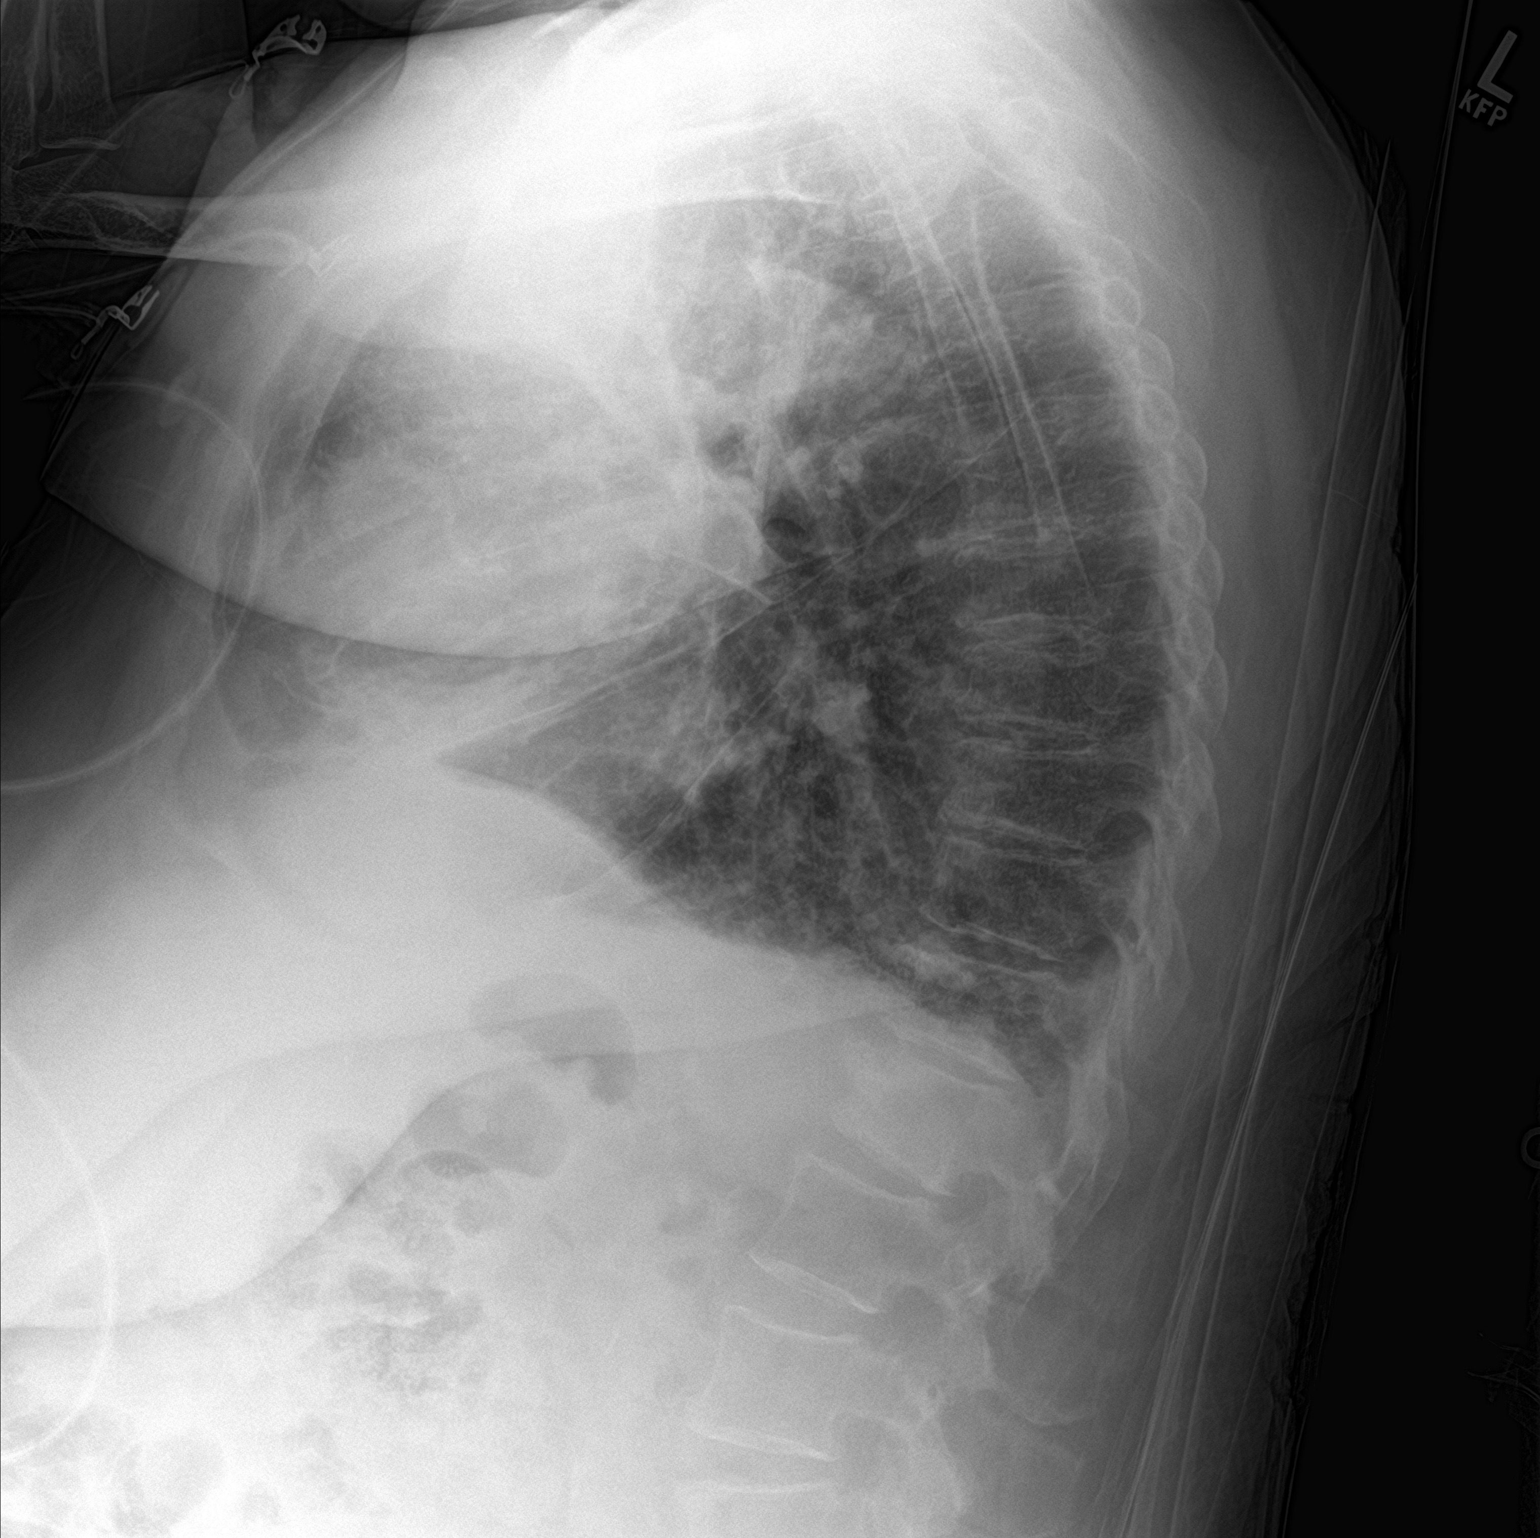

[chest ap]
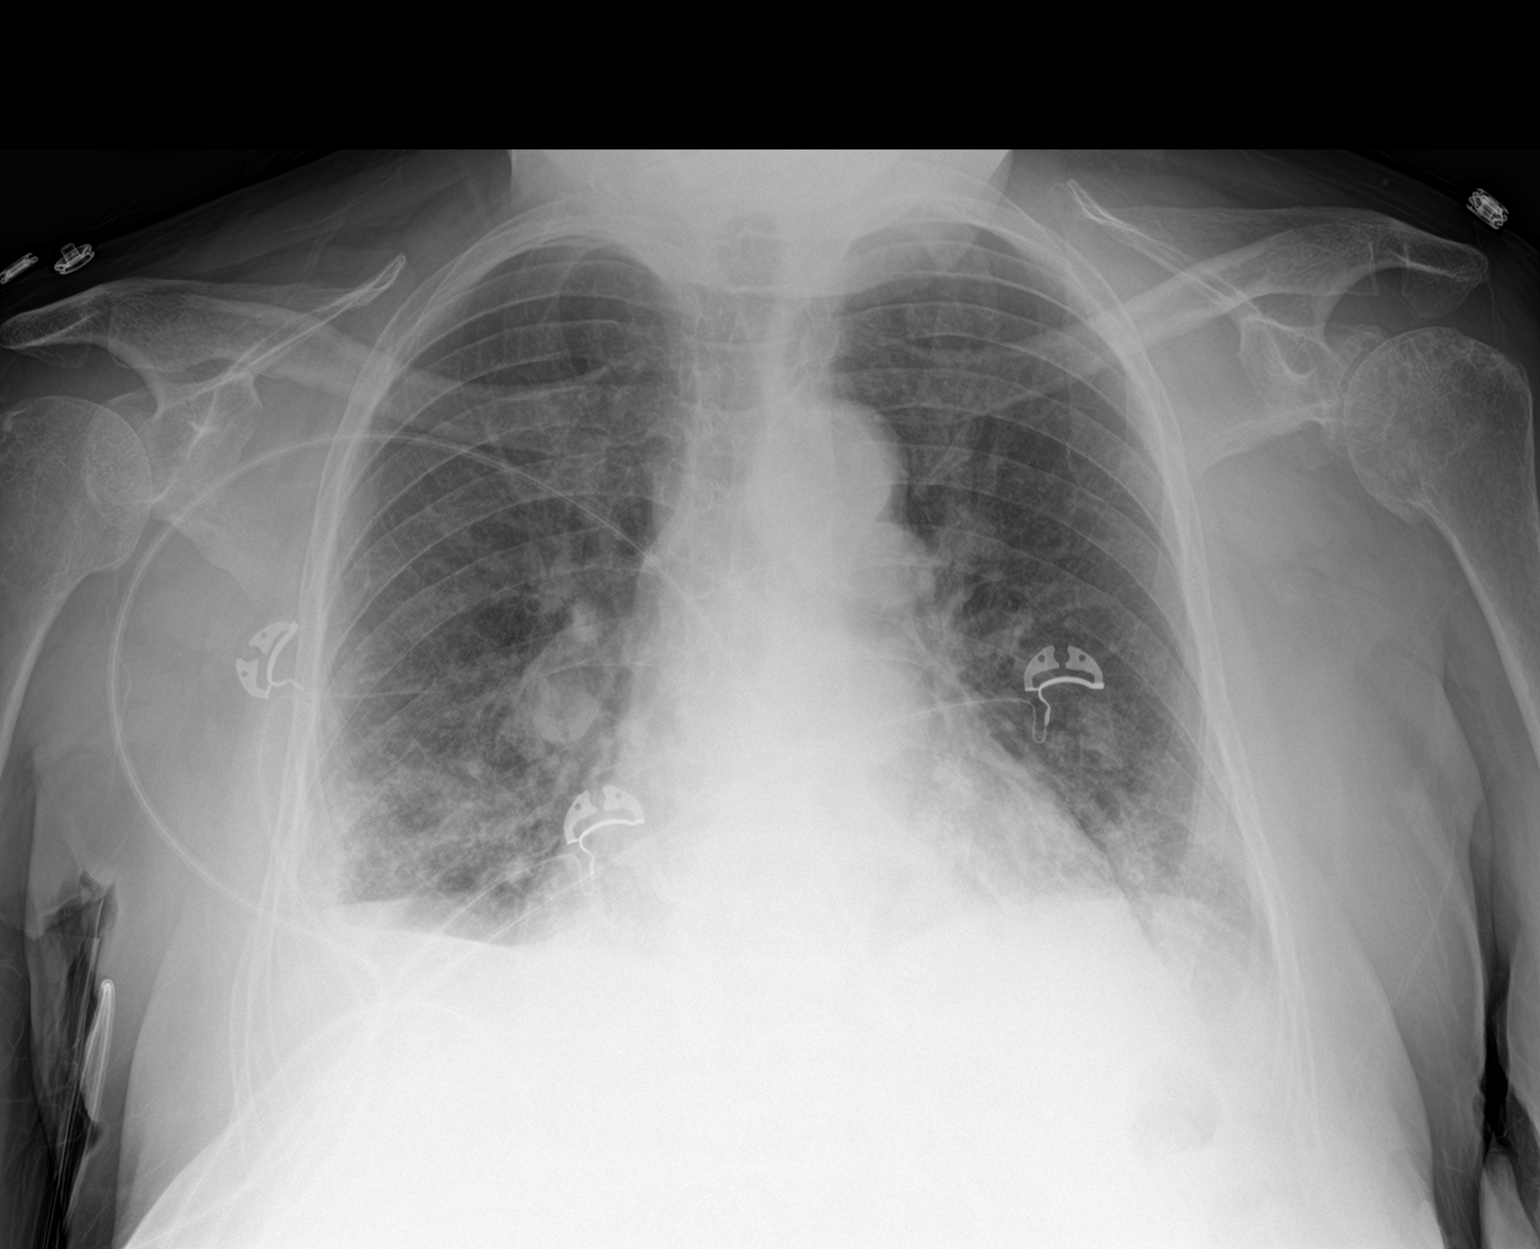

[2 of 2 positions shown; findings below may reference images not displayed]

FINDINGS: The heart size and mediastinal contours are stable. The heart size
is enlarged. Patchy consolidation of bilateral lung bases are noted.
There is a small right pleural effusion. There is mild interstitial
edema. The visualized skeletal structures are stable.
IMPRESSION: Patchy consolidation of bilateral lung bases pneumonia is not
excluded. Small right pleural effusion.

Mild interstitial edema.  Cardiomegaly.

## 2018-02-09 DIAGNOSIS — N186 End stage renal disease: Secondary | ICD-10-CM | POA: Diagnosis not present

## 2018-02-09 DIAGNOSIS — N2581 Secondary hyperparathyroidism of renal origin: Secondary | ICD-10-CM | POA: Diagnosis not present

## 2018-02-12 DIAGNOSIS — N186 End stage renal disease: Secondary | ICD-10-CM | POA: Diagnosis not present

## 2018-02-12 DIAGNOSIS — N2581 Secondary hyperparathyroidism of renal origin: Secondary | ICD-10-CM | POA: Diagnosis not present

## 2018-02-14 DIAGNOSIS — N2581 Secondary hyperparathyroidism of renal origin: Secondary | ICD-10-CM | POA: Diagnosis not present

## 2018-02-14 DIAGNOSIS — N186 End stage renal disease: Secondary | ICD-10-CM | POA: Diagnosis not present

## 2018-02-15 DIAGNOSIS — N186 End stage renal disease: Secondary | ICD-10-CM | POA: Diagnosis not present

## 2018-02-15 DIAGNOSIS — N2581 Secondary hyperparathyroidism of renal origin: Secondary | ICD-10-CM | POA: Diagnosis not present

## 2018-02-19 DIAGNOSIS — N2581 Secondary hyperparathyroidism of renal origin: Secondary | ICD-10-CM | POA: Diagnosis not present

## 2018-02-19 DIAGNOSIS — N186 End stage renal disease: Secondary | ICD-10-CM | POA: Diagnosis not present

## 2018-02-21 DIAGNOSIS — Z992 Dependence on renal dialysis: Secondary | ICD-10-CM | POA: Diagnosis not present

## 2018-02-21 DIAGNOSIS — N186 End stage renal disease: Secondary | ICD-10-CM | POA: Diagnosis not present

## 2018-02-21 DIAGNOSIS — N2581 Secondary hyperparathyroidism of renal origin: Secondary | ICD-10-CM | POA: Diagnosis not present

## 2018-02-21 DIAGNOSIS — E1129 Type 2 diabetes mellitus with other diabetic kidney complication: Secondary | ICD-10-CM | POA: Diagnosis not present

## 2018-02-23 DIAGNOSIS — N186 End stage renal disease: Secondary | ICD-10-CM | POA: Diagnosis not present

## 2018-02-23 DIAGNOSIS — N2581 Secondary hyperparathyroidism of renal origin: Secondary | ICD-10-CM | POA: Diagnosis not present

## 2018-02-26 DIAGNOSIS — N186 End stage renal disease: Secondary | ICD-10-CM | POA: Diagnosis not present

## 2018-02-26 DIAGNOSIS — N2581 Secondary hyperparathyroidism of renal origin: Secondary | ICD-10-CM | POA: Diagnosis not present

## 2018-02-28 DIAGNOSIS — N186 End stage renal disease: Secondary | ICD-10-CM | POA: Diagnosis not present

## 2018-02-28 DIAGNOSIS — N2581 Secondary hyperparathyroidism of renal origin: Secondary | ICD-10-CM | POA: Diagnosis not present

## 2018-03-01 ENCOUNTER — Telehealth: Payer: Self-pay

## 2018-03-01 ENCOUNTER — Ambulatory Visit: Payer: Medicare HMO | Admitting: Nurse Practitioner

## 2018-03-01 NOTE — Telephone Encounter (Signed)
Patient called requesting refill on xanax and Ambien. Patient states she is not sleeping well and needs these medications filled ASAP.  I informed patient that neither requested medication is on her current medication list and I will have to send to [name], NP for approval   According to [place]:  Xanax last filled 03/26/17, removed from medication list on 06/04/17 (patient preference)  Ambien last filled 09/28/2016, removed from medication list on 01/02/17 (no d/c reason selected)

## 2018-03-01 NOTE — Telephone Encounter (Signed)
Spoke with patient, patient scheduled appointment for Monday 03/04/18 (rescheduled from 03/15/18).  Patient only available on Monday or Friday due to dialysis on other days   S.Chrae B/CMA

## 2018-03-01 NOTE — Telephone Encounter (Signed)
She will need an appt to discuss this.

## 2018-03-02 DIAGNOSIS — N186 End stage renal disease: Secondary | ICD-10-CM | POA: Diagnosis not present

## 2018-03-02 DIAGNOSIS — N2581 Secondary hyperparathyroidism of renal origin: Secondary | ICD-10-CM | POA: Diagnosis not present

## 2018-03-03 NOTE — Telephone Encounter (Signed)
Thank you :)

## 2018-03-04 ENCOUNTER — Encounter: Payer: Self-pay | Admitting: Nurse Practitioner

## 2018-03-04 ENCOUNTER — Ambulatory Visit (INDEPENDENT_AMBULATORY_CARE_PROVIDER_SITE_OTHER): Payer: Medicare HMO | Admitting: Nurse Practitioner

## 2018-03-04 VITALS — BP 132/84 | HR 98 | Temp 98.6°F | Ht 65.0 in | Wt 184.4 lb

## 2018-03-04 DIAGNOSIS — K219 Gastro-esophageal reflux disease without esophagitis: Secondary | ICD-10-CM | POA: Diagnosis not present

## 2018-03-04 DIAGNOSIS — Z794 Long term (current) use of insulin: Secondary | ICD-10-CM

## 2018-03-04 DIAGNOSIS — F418 Other specified anxiety disorders: Secondary | ICD-10-CM | POA: Diagnosis not present

## 2018-03-04 DIAGNOSIS — E785 Hyperlipidemia, unspecified: Secondary | ICD-10-CM

## 2018-03-04 DIAGNOSIS — E1149 Type 2 diabetes mellitus with other diabetic neurological complication: Secondary | ICD-10-CM | POA: Diagnosis not present

## 2018-03-04 DIAGNOSIS — I639 Cerebral infarction, unspecified: Secondary | ICD-10-CM | POA: Diagnosis not present

## 2018-03-04 DIAGNOSIS — Z992 Dependence on renal dialysis: Secondary | ICD-10-CM | POA: Diagnosis not present

## 2018-03-04 DIAGNOSIS — G4700 Insomnia, unspecified: Secondary | ICD-10-CM

## 2018-03-04 DIAGNOSIS — Z720 Tobacco use: Secondary | ICD-10-CM | POA: Diagnosis not present

## 2018-03-04 DIAGNOSIS — N186 End stage renal disease: Secondary | ICD-10-CM

## 2018-03-04 MED ORDER — EZETIMIBE 10 MG PO TABS: 10.0000 mg | ORAL_TABLET | Freq: Every day | ORAL | 1 refills | Status: DC

## 2018-03-04 MED ORDER — TRAZODONE HCL 50 MG PO TABS: 50.0000 mg | ORAL_TABLET | Freq: Every day | ORAL | 3 refills | Status: DC

## 2018-03-04 NOTE — Patient Instructions (Addendum)
Follow up in 6 weeks with Dr Mariea Clonts ([place] visit)    Insomnia Insomnia is a sleep disorder that makes it difficult to fall asleep or to stay asleep. Insomnia can cause tiredness (fatigue), low energy, difficulty concentrating, mood swings, and poor performance at work or school. There are three different ways to classify insomnia:  Difficulty falling asleep.  Difficulty staying asleep.  Waking up too early in the morning.  Any type of insomnia can be long-term (chronic) or short-term (acute). Both are common. Short-term insomnia usually lasts for three months or less. Chronic insomnia occurs at least three times a week for longer than three months. What are the causes? Insomnia may be caused by another condition, situation, or substance, such as:  Anxiety.  Certain medicines.  Gastroesophageal reflux disease (GERD) or other gastrointestinal conditions.  Asthma or other breathing conditions.  Restless legs syndrome, sleep apnea, or other sleep disorders.  Chronic pain.  Menopause. This may include hot flashes.  Stroke.  Abuse of alcohol, tobacco, or illegal drugs.  Depression.  Caffeine.  Neurological disorders, such as Alzheimer disease.  An overactive thyroid (hyperthyroidism).  The cause of insomnia may not be known. What increases the risk? Risk factors for insomnia include:  Gender. Women are more commonly affected than men.  Age. Insomnia is more common as you get older.  Stress. This may involve your professional or personal life.  Income. Insomnia is more common in people with lower income.  Lack of exercise.  Irregular work schedule or night shifts.  Traveling between different time zones.  What are the signs or symptoms? If you have insomnia, trouble falling asleep or trouble staying asleep is the main symptom. This may lead to other symptoms, such as:  Feeling fatigued.  Feeling nervous about going to sleep.  Not feeling rested in the  morning.  Having trouble concentrating.  Feeling irritable, anxious, or depressed.  How is this treated? Treatment for insomnia depends on the cause. If your insomnia is caused by an underlying condition, treatment will focus on addressing the condition. Treatment may also include:  Medicines to help you sleep.  Counseling or therapy.  Lifestyle adjustments.  Follow these instructions at home:  Take medicines only as directed by your health care provider.  Keep regular sleeping and waking hours. Avoid naps.  Keep a sleep diary to help you and your health care provider figure out what could be causing your insomnia. Include: ? When you sleep. ? When you wake up during the night. ? How well you sleep. ? How rested you feel the next day. ? Any side effects of medicines you are taking. ? What you eat and drink.  Make your bedroom a comfortable place where it is easy to fall asleep: ? Put up shades or special blackout curtains to block light from outside. ? Use a white noise machine to block noise. ? Keep the temperature cool.  Exercise regularly as directed by your health care provider. Avoid exercising right before bedtime.  Use relaxation techniques to manage stress. Ask your health care provider to suggest some techniques that may work well for you. These may include: ? Breathing exercises. ? Routines to release muscle tension. ? Visualizing peaceful scenes.  Cut back on alcohol, caffeinated beverages, and cigarettes, especially close to bedtime. These can disrupt your sleep.  Do not overeat or eat spicy foods right before bedtime. This can lead to digestive discomfort that can make it hard for you to sleep.  Limit screen use  before bedtime. This includes: ? Watching TV. ? Using your smartphone, tablet, and computer.  Stick to a routine. This can help you fall asleep faster. Try to do a quiet activity, brush your teeth, and go to bed at the same time each night.  Get  out of bed if you are still awake after 15 minutes of trying to sleep. Keep the lights down, but try reading or doing a quiet activity. When you feel sleepy, go back to bed.  Make sure that you drive carefully. Avoid driving if you feel very sleepy.  Keep all follow-up appointments as directed by your health care provider. This is important. Contact a health care provider if:  You are tired throughout the day or have trouble in your daily routine due to sleepiness.  You continue to have sleep problems or your sleep problems get worse. Get help right away if:  You have serious thoughts about hurting yourself or someone else. This information is not intended to replace advice given to you by your health care provider. Make sure you discuss any questions you have with your health care provider. Document Released: 04/07/2000 Document Revised: 09/10/2015 Document Reviewed: 01/09/2014 Elsevier Interactive Patient Education  [employer].

## 2018-03-04 NOTE — Progress Notes (Signed)
Careteam: Patient Care Team: [name], NP as PCP - General (Nurse Practitioner) [name], MD as Consulting Physician (Ophthalmology) [name], MD as Consulting Physician (Nephrology) [name], NP as Nurse Practitioner (Nurse Practitioner)  Advanced Directive information    Allergies  Allergen Reactions  . Lisinopril Other (See Comments)    Abnormal Kidney Function   . Pioglitazone Other (See Comments)    REACTION: Desquamation of skin of the palm  . Morphine And Related Nausea And Vomiting  . Sulfonamide Derivatives Rash    Chief Complaint  Patient presents with  . Medical Management of Chronic Issues    Pt is being seen for a 8 month routine visit. Pt wants to discuss getting xanax and zolpidem prescribed again.     HPI: Patient is a 72 y.o. female seen in the office today for routine follow up.   Reports she is not sleeping good- was previously on Ambien and Xananx.  Currently on HD Tuesday, Thursday, saturdays   Unsure what medication she is taking.  Depression/anxiety- unsure if she is taking her paxil. Reports she will go into a depression when thinking about the loss of her Mother.   Hyperlipidemia- taking pravastatin  And zetia daily for cholesterol.   GERD- stable on protonix  ESRD on HD- taking revela, sensipar, and phoslo per nephrologist.   Hx of CVA, currently asa and plavix.    Review of Systems:  Review of Systems  Constitutional: Negative for chills, fever and weight loss.  HENT: Negative for tinnitus.   Respiratory: Negative for cough, sputum production and shortness of breath.   Cardiovascular: Negative for chest pain, palpitations and leg swelling.  Gastrointestinal: Negative for abdominal pain, constipation, diarrhea and heartburn.  Genitourinary: Negative for dysuria, frequency and urgency.  Musculoskeletal: Negative for back pain, falls, joint pain and myalgias.  Skin: Negative.   Neurological:  Negative for dizziness and headaches.  Endo/Heme/Allergies:       Hot flashes  Psychiatric/Behavioral: Positive for depression and memory loss. Negative for suicidal ideas. The patient is nervous/anxious and has insomnia.     Past Medical History:  Diagnosis Date  . Anemia   . Anxiety   . Arthritis    lower back and knees   . Asthma    childhood  . Barrett's esophagus   . Chronic kidney disease   . Coagulation defect ([place])   . Depression   . Diabetes mellitus   . Dysphagia   . Eczema   . End stage renal disease ([place])   . Fluid overload, unspecified   . History of herpes zoster 02/2010   Recovered fully after period of acute herpetic neuralgia tx w/ gabapentin.   Marland Kitchen Hx MRSA infection 2009  . Hypercalcemia   . Hyperlipidemia   . Hypertension   . Moderate protein-calorie malnutrition ([place])   . Neuromuscular disorder (HCC)    chronic pain  . Personal history of colonic polyps 10/17/2010   hyperplastic   Past Surgical History:  Procedure Laterality Date  . ABDOMINAL HYSTERECTOMY     unclear when  . AV FISTULA PLACEMENT Left 02/13/2017   Procedure: ARTERIOVENOUS (AV) GORE-TEX STRETCH GRAFT INSERTION INTO LEFT ARM;  Surgeon: Elam Dutch, MD;  Location: [facility];  Service: Vascular;  Laterality: Left;  . CATARACT EXTRACTION Bilateral   . thigh surg     left side due to MRSA   Social History:   reports that she has been smoking cigarettes. She has a 40.00 pack-year  smoking history. She has never used smokeless tobacco. She reports that she drinks about 2.0 standard drinks of alcohol per week. She reports that she does not use drugs.  Family History  Problem Relation Age of Onset  . Hypertension Father   . Kidney disease Father   . Diabetes Brother   . Colon cancer Neg Hx   . CAD Neg Hx     Medications: Patient's Medications  New Prescriptions   No medications on file  Previous Medications   ASPIRIN 81 MG CHEWABLE TABLET    Chew 1 tablet (81 mg total) by mouth  daily.   CALCIUM ACETATE (PHOSLO) 667 MG CAPSULE    Take by mouth 3 (three) times daily with meals.   CINACALCET (SENSIPAR) 30 MG TABLET    Take 30 mg by mouth daily.   CLOPIDOGREL (PLAVIX) 75 MG TABLET    Take 1 tablet (75 mg total) by mouth daily. Do not use blister packs   EZETIMIBE (ZETIA) 10 MG TABLET    TAKE 1 TABLET BY MOUTH EVERY DAY   LATANOPROST (XALATAN) 0.005 % OPHTHALMIC SOLUTION    Place 1 drop into both eyes at bedtime. Reported on 08/27/2015   PANTOPRAZOLE (PROTONIX) 40 MG TABLET    Take 1 tablet (40 mg total) by mouth daily.   PAROXETINE (PAXIL) 40 MG TABLET    TAKE 1 TABLET (40 MG TOTAL) BY MOUTH DAILY.   PRAVASTATIN (PRAVACHOL) 40 MG TABLET    TAKE 1 TABLET (40 MG TOTAL) BY MOUTH AT BEDTIME.   SEVELAMER CARBONATE (RENVELA) 2.4 G PACK    2.4 g 3 (three) times daily with meals.   TRIAMCINOLONE CREAM (KENALOG) 0.1 %    Apply 1 application topically 3 (three) times daily as needed (rash).  Modified Medications   No medications on file  Discontinued Medications   CALCITRIOL (ROCALTROL) 0.25 MCG CAPSULE    Take 1 capsule (0.25 mcg total) by mouth Every Tuesday,Thursday,and Saturday with dialysis.   ONDANSETRON (ZOFRAN) 4 MG TABLET    On dialysis days as needed for nausea   PREDNISONE (STERAPRED UNI-PAK 21 TAB) 10 MG (21) TBPK TABLET    Use as directed   SUMATRIPTAN (IMITREX) 25 MG TABLET    1 tablet daily as needed for headache, May repeat in 2 hours if headache persists or recurs. Max of 2 tablets in 24 hours     Physical Exam:  Vitals:   03/04/18 0825  BP: 132/84  Pulse: 98  Temp: 98.6 F (37 C)  TempSrc: Oral  SpO2: 95%  Weight: 184 lb 6.4 oz (83.6 kg)  Height: 5\' 5"  (1.651 m)   Body mass index is 30.69 kg/m.  Physical Exam  Constitutional: She is oriented to person, place, and time. She appears well-developed and well-nourished. No distress.  HENT:  Head: Normocephalic and atraumatic.  Eyes: Pupils are equal, round, and reactive to light. Conjunctivae and EOM  are normal.  Cardiovascular: Normal rate, regular rhythm and normal heart sounds.  No murmur heard. Pulmonary/Chest: Effort normal and breath sounds normal. No respiratory distress. She has no wheezes.  Abdominal: Soft. Bowel sounds are normal.  Musculoskeletal: Normal range of motion. She exhibits no edema or tenderness.  Neurological: She is alert and oriented to person, place, and time.  Skin: Skin is warm and dry.  Psychiatric: Her behavior is normal. Judgment and thought content normal. Her affect is blunt.    Labs reviewed: Basic Metabolic Panel: Recent Labs    05/06/17 2000  05/07/17 2951  05/09/17 0434 05/10/17 0441 05/11/17 0933 09/20/17 09/27/17  NA  --    < > 139   < > 139 141 139 142  --   K  --    < > 4.6   < > 4.3 4.2 3.9 4.3 4.4  CL  --    < > 109   < > 104 105 106  --   --   CO2  --    < > 20*   < > 24 23 22   --   --   GLUCOSE  --    < > 220*   < > 121* 142* 186*  --   --   BUN  --    < > 57*   < > 24* 21* 24*  --   --   CREATININE  --    < > 7.08*   < > 4.39* 3.90* 5.39* 8.8*  --   CALCIUM  --    < > 8.4*   < > 8.6* 8.3* 8.7*  --   --   PHOS 5.3*  --  5.3*  --   --   --  4.4  --   --    < > = values in this interval not displayed.   Liver Function Tests: Recent Labs    03/09/17 2219 03/11/17 0327  03/14/17 0554 05/07/17 0952 05/11/17 0933  AST 19 15  --   --   --   --   ALT 19 17  --   --   --   --   ALKPHOS 93 87  --   --   --   --   BILITOT 0.7 0.5  --   --   --   --   PROT 6.0* 5.8*  --   --   --   --   ALBUMIN 3.1* 2.8*   < > 2.4* 2.7* 3.1*   < > = values in this interval not displayed.   No results for input(s): LIPASE, AMYLASE in the last 8760 hours. No results for input(s): AMMONIA in the last 8760 hours. CBC: Recent Labs    03/09/17 2219  05/08/17 1729 05/09/17 0434 05/11/17 0933  09/20/17 09/27/17 01/22/18  WBC 11.0*   < > 6.5 7.5 6.6  --  8.0  --   --   NEUTROABS 5.9  --   --  4.6  --   --   --   --   --   HGB 8.4*   < > 7.5* 9.0*  8.9*   < > 10.6* 10.2* 11.6*  HCT 26.3*   < > 23.1* 26.9* 27.5*  --   --   --   --   MCV 90.1   < > 87.2 87.1 88.4  --   --   --   --   PLT 249   < > 155 139* 173  --  198  --   --    < > = values in this interval not displayed.   Lipid Panel: Recent Labs    07/02/17 0830  CHOL 162  HDL 51  LDLCALC 89  TRIG 120  CHOLHDL 3.2   TSH: No results for input(s): TSH in the last 8760 hours. A1C: Lab Results  Component Value Date   HGBA1C 5.5 01/22/2018     Assessment/Plan 1. Cerebrovascular accident (CVA), unspecified mechanism ([place]) -no recent TIA or event. Continues on asa and plavix.  - ezetimibe (ZETIA) 10 MG  tablet; Take 1 tablet (10 mg total) by mouth daily.  Dispense: 90 tablet; Refill: 1  2. Gastroesophageal reflux disease without esophagitis Stable on Protonix with dietary modifications.   3. Depression with anxiety -ongoing, reports some days are better than other. Will continue paxil. Trazodone added for sleep which will hopefully benefit mood as well.   4. ESRD (end stage renal disease) on dialysis [facility]) Continues to follow up with nephrology and on HD Tuesday, Thursday and Saturday.   5. 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 Tobacco abuse Encouraged to stop smoking.   7. Insomnia, unspecified type Having trouble staying asleep, encourage sleep routine. Not to stay in bed other than to sleep. Information provided -will start trazodone at this time.  - traZODone (DESYREL) 50 MG tablet; Take 1 tablet (50 mg total) by mouth at bedtime. For sleep  Dispense: 30 tablet; Refill: 3 - TSH  8. Hyperlipidemia, unspecified hyperlipidemia type continue on zetia and pravastatin.  - Hepatic Function Panel  Next appt: 6 weeks for physician visit with Dr Mariea Clonts and to follow up insomnia/trazodone start.  Carlos American. [place], [place]  Adult Medicine [phone]

## 2018-03-05 DIAGNOSIS — N186 End stage renal disease: Secondary | ICD-10-CM | POA: Diagnosis not present

## 2018-03-05 DIAGNOSIS — N2581 Secondary hyperparathyroidism of renal origin: Secondary | ICD-10-CM | POA: Diagnosis not present

## 2018-03-05 LAB — TSH: TSH: 1.57 mIU/L (ref 0.40–4.50)

## 2018-03-05 LAB — HEPATIC FUNCTION PANEL
AG RATIO: 1.8 (calc) (ref 1.0–2.5)
ALKALINE PHOSPHATASE (APISO): 108 U/L (ref 33–130)
ALT: 14 U/L (ref 6–29)
AST: 13 U/L (ref 10–35)
Albumin: 4.3 g/dL (ref 3.6–5.1)
BILIRUBIN DIRECT: 0.1 mg/dL (ref 0.0–0.2)
Globulin: 2.4 g/dL (calc) (ref 1.9–3.7)
Indirect Bilirubin: 0.3 mg/dL (calc) (ref 0.2–1.2)
Total Bilirubin: 0.4 mg/dL (ref 0.2–1.2)
Total Protein: 6.7 g/dL (ref 6.1–8.1)

## 2018-03-07 ENCOUNTER — Other Ambulatory Visit: Payer: Self-pay | Admitting: Nurse Practitioner

## 2018-03-07 DIAGNOSIS — N2581 Secondary hyperparathyroidism of renal origin: Secondary | ICD-10-CM | POA: Diagnosis not present

## 2018-03-07 DIAGNOSIS — N186 End stage renal disease: Secondary | ICD-10-CM | POA: Diagnosis not present

## 2018-03-07 DIAGNOSIS — F418 Other specified anxiety disorders: Secondary | ICD-10-CM

## 2018-03-09 DIAGNOSIS — N186 End stage renal disease: Secondary | ICD-10-CM | POA: Diagnosis not present

## 2018-03-09 DIAGNOSIS — N2581 Secondary hyperparathyroidism of renal origin: Secondary | ICD-10-CM | POA: Diagnosis not present

## 2018-03-12 DIAGNOSIS — N2581 Secondary hyperparathyroidism of renal origin: Secondary | ICD-10-CM | POA: Diagnosis not present

## 2018-03-12 DIAGNOSIS — N186 End stage renal disease: Secondary | ICD-10-CM | POA: Diagnosis not present

## 2018-03-12 LAB — CBC AND DIFFERENTIAL: HEMOGLOBIN: 9.5 — AB (ref 12.0–16.0)

## 2018-03-12 LAB — BASIC METABOLIC PANEL: Potassium: 4.6 (ref 3.4–5.3)

## 2018-03-14 DIAGNOSIS — N186 End stage renal disease: Secondary | ICD-10-CM | POA: Diagnosis not present

## 2018-03-14 DIAGNOSIS — N2581 Secondary hyperparathyroidism of renal origin: Secondary | ICD-10-CM | POA: Diagnosis not present

## 2018-03-15 ENCOUNTER — Ambulatory Visit: Payer: Medicare HMO | Admitting: Nurse Practitioner

## 2018-03-16 DIAGNOSIS — N2581 Secondary hyperparathyroidism of renal origin: Secondary | ICD-10-CM | POA: Diagnosis not present

## 2018-03-16 DIAGNOSIS — N186 End stage renal disease: Secondary | ICD-10-CM | POA: Diagnosis not present

## 2018-03-18 DIAGNOSIS — N186 End stage renal disease: Secondary | ICD-10-CM | POA: Diagnosis not present

## 2018-03-18 DIAGNOSIS — N2581 Secondary hyperparathyroidism of renal origin: Secondary | ICD-10-CM | POA: Diagnosis not present

## 2018-03-20 ENCOUNTER — Telehealth: Payer: Self-pay

## 2018-03-20 DIAGNOSIS — N2581 Secondary hyperparathyroidism of renal origin: Secondary | ICD-10-CM | POA: Diagnosis not present

## 2018-03-20 DIAGNOSIS — N186 End stage renal disease: Secondary | ICD-10-CM | POA: Diagnosis not present

## 2018-03-20 NOTE — Telephone Encounter (Signed)
-----   Message from [name], NP sent at 03/19/2018  4:31 PM EST ----- Regarding: follow up needed Pt needs to schedule a follow up appt with Dr Mariea Clonts in January for doctors visit- she also needs CT lung due to smoking hx, she let her order expire. thanks

## 2018-03-20 NOTE — Telephone Encounter (Signed)
Called patient but line was busy. Called alternative number and there was no answer and no voicemail picked up. Will try again later.

## 2018-03-23 DIAGNOSIS — N186 End stage renal disease: Secondary | ICD-10-CM | POA: Diagnosis not present

## 2018-03-23 DIAGNOSIS — E1129 Type 2 diabetes mellitus with other diabetic kidney complication: Secondary | ICD-10-CM | POA: Diagnosis not present

## 2018-03-23 DIAGNOSIS — N2581 Secondary hyperparathyroidism of renal origin: Secondary | ICD-10-CM | POA: Diagnosis not present

## 2018-03-23 DIAGNOSIS — Z992 Dependence on renal dialysis: Secondary | ICD-10-CM | POA: Diagnosis not present

## 2018-03-26 DIAGNOSIS — N186 End stage renal disease: Secondary | ICD-10-CM | POA: Diagnosis not present

## 2018-03-26 DIAGNOSIS — N2581 Secondary hyperparathyroidism of renal origin: Secondary | ICD-10-CM | POA: Diagnosis not present

## 2018-03-28 DIAGNOSIS — N186 End stage renal disease: Secondary | ICD-10-CM | POA: Diagnosis not present

## 2018-03-28 DIAGNOSIS — N2581 Secondary hyperparathyroidism of renal origin: Secondary | ICD-10-CM | POA: Diagnosis not present

## 2018-03-29 ENCOUNTER — Other Ambulatory Visit: Payer: Self-pay | Admitting: Nurse Practitioner

## 2018-03-30 DIAGNOSIS — N2581 Secondary hyperparathyroidism of renal origin: Secondary | ICD-10-CM | POA: Diagnosis not present

## 2018-03-30 DIAGNOSIS — N186 End stage renal disease: Secondary | ICD-10-CM | POA: Diagnosis not present

## 2018-04-02 DIAGNOSIS — N2581 Secondary hyperparathyroidism of renal origin: Secondary | ICD-10-CM | POA: Diagnosis not present

## 2018-04-02 DIAGNOSIS — N186 End stage renal disease: Secondary | ICD-10-CM | POA: Diagnosis not present

## 2018-04-04 DIAGNOSIS — N186 End stage renal disease: Secondary | ICD-10-CM | POA: Diagnosis not present

## 2018-04-04 DIAGNOSIS — N2581 Secondary hyperparathyroidism of renal origin: Secondary | ICD-10-CM | POA: Diagnosis not present

## 2018-04-06 DIAGNOSIS — N186 End stage renal disease: Secondary | ICD-10-CM | POA: Diagnosis not present

## 2018-04-06 DIAGNOSIS — N2581 Secondary hyperparathyroidism of renal origin: Secondary | ICD-10-CM | POA: Diagnosis not present

## 2018-04-09 DIAGNOSIS — N2581 Secondary hyperparathyroidism of renal origin: Secondary | ICD-10-CM | POA: Diagnosis not present

## 2018-04-09 DIAGNOSIS — N186 End stage renal disease: Secondary | ICD-10-CM | POA: Diagnosis not present

## 2018-04-11 DIAGNOSIS — N186 End stage renal disease: Secondary | ICD-10-CM | POA: Diagnosis not present

## 2018-04-11 DIAGNOSIS — N2581 Secondary hyperparathyroidism of renal origin: Secondary | ICD-10-CM | POA: Diagnosis not present

## 2018-04-11 LAB — CBC AND DIFFERENTIAL: Hemoglobin: 10.8 — AB (ref 12.0–16.0)

## 2018-04-11 LAB — BASIC METABOLIC PANEL: Potassium: 4.6 (ref 3.4–5.3)

## 2018-04-13 DIAGNOSIS — N186 End stage renal disease: Secondary | ICD-10-CM | POA: Diagnosis not present

## 2018-04-13 DIAGNOSIS — N2581 Secondary hyperparathyroidism of renal origin: Secondary | ICD-10-CM | POA: Diagnosis not present

## 2018-04-15 DIAGNOSIS — N2581 Secondary hyperparathyroidism of renal origin: Secondary | ICD-10-CM | POA: Diagnosis not present

## 2018-04-15 DIAGNOSIS — N186 End stage renal disease: Secondary | ICD-10-CM | POA: Diagnosis not present

## 2018-04-18 DIAGNOSIS — N2581 Secondary hyperparathyroidism of renal origin: Secondary | ICD-10-CM | POA: Diagnosis not present

## 2018-04-18 DIAGNOSIS — N186 End stage renal disease: Secondary | ICD-10-CM | POA: Diagnosis not present

## 2018-04-20 DIAGNOSIS — N2581 Secondary hyperparathyroidism of renal origin: Secondary | ICD-10-CM | POA: Diagnosis not present

## 2018-04-20 DIAGNOSIS — N186 End stage renal disease: Secondary | ICD-10-CM | POA: Diagnosis not present

## 2018-04-22 DIAGNOSIS — N2581 Secondary hyperparathyroidism of renal origin: Secondary | ICD-10-CM | POA: Diagnosis not present

## 2018-04-22 DIAGNOSIS — N186 End stage renal disease: Secondary | ICD-10-CM | POA: Diagnosis not present

## 2018-04-23 DIAGNOSIS — Z992 Dependence on renal dialysis: Secondary | ICD-10-CM | POA: Diagnosis not present

## 2018-04-23 DIAGNOSIS — E1129 Type 2 diabetes mellitus with other diabetic kidney complication: Secondary | ICD-10-CM | POA: Diagnosis not present

## 2018-04-23 DIAGNOSIS — N186 End stage renal disease: Secondary | ICD-10-CM | POA: Diagnosis not present

## 2018-04-25 DIAGNOSIS — N2581 Secondary hyperparathyroidism of renal origin: Secondary | ICD-10-CM | POA: Diagnosis not present

## 2018-04-25 DIAGNOSIS — N186 End stage renal disease: Secondary | ICD-10-CM | POA: Diagnosis not present

## 2018-04-26 DIAGNOSIS — N186 End stage renal disease: Secondary | ICD-10-CM | POA: Diagnosis not present

## 2018-04-26 DIAGNOSIS — I871 Compression of vein: Secondary | ICD-10-CM | POA: Diagnosis not present

## 2018-04-26 DIAGNOSIS — Z992 Dependence on renal dialysis: Secondary | ICD-10-CM | POA: Diagnosis not present

## 2018-04-26 DIAGNOSIS — T82858A Stenosis of vascular prosthetic devices, implants and grafts, initial encounter: Secondary | ICD-10-CM | POA: Diagnosis not present

## 2018-04-27 DIAGNOSIS — N2581 Secondary hyperparathyroidism of renal origin: Secondary | ICD-10-CM | POA: Diagnosis not present

## 2018-04-27 DIAGNOSIS — N186 End stage renal disease: Secondary | ICD-10-CM | POA: Diagnosis not present

## 2018-04-28 ENCOUNTER — Other Ambulatory Visit: Payer: Self-pay | Admitting: Nurse Practitioner

## 2018-04-28 DIAGNOSIS — I639 Cerebral infarction, unspecified: Secondary | ICD-10-CM

## 2018-04-29 NOTE — Telephone Encounter (Signed)
Have made several attempts to reach patient regarding scheduling a future an appointment with Janett Billow. Line was busy both attempts. Will try again at another time.

## 2018-04-30 DIAGNOSIS — N2581 Secondary hyperparathyroidism of renal origin: Secondary | ICD-10-CM | POA: Diagnosis not present

## 2018-04-30 DIAGNOSIS — N186 End stage renal disease: Secondary | ICD-10-CM | POA: Diagnosis not present

## 2018-05-01 ENCOUNTER — Encounter: Payer: Self-pay | Admitting: *Deleted

## 2018-05-02 DIAGNOSIS — N186 End stage renal disease: Secondary | ICD-10-CM | POA: Diagnosis not present

## 2018-05-02 DIAGNOSIS — N2581 Secondary hyperparathyroidism of renal origin: Secondary | ICD-10-CM | POA: Diagnosis not present

## 2018-05-04 DIAGNOSIS — N186 End stage renal disease: Secondary | ICD-10-CM | POA: Diagnosis not present

## 2018-05-04 DIAGNOSIS — N2581 Secondary hyperparathyroidism of renal origin: Secondary | ICD-10-CM | POA: Diagnosis not present

## 2018-05-07 DIAGNOSIS — N2581 Secondary hyperparathyroidism of renal origin: Secondary | ICD-10-CM | POA: Diagnosis not present

## 2018-05-07 DIAGNOSIS — N186 End stage renal disease: Secondary | ICD-10-CM | POA: Diagnosis not present

## 2018-05-09 ENCOUNTER — Other Ambulatory Visit: Payer: Self-pay

## 2018-05-09 ENCOUNTER — Emergency Department (HOSPITAL_COMMUNITY)
Admission: EM | Admit: 2018-05-09 | Discharge: 2018-05-09 | Disposition: A | Discharge status: Home / Self Care | Payer: Medicare HMO | Attending: Emergency Medicine | Admitting: Emergency Medicine

## 2018-05-09 ENCOUNTER — Emergency Department (HOSPITAL_COMMUNITY): Payer: Medicare HMO

## 2018-05-09 DIAGNOSIS — Z79899 Other long term (current) drug therapy: Secondary | ICD-10-CM | POA: Diagnosis not present

## 2018-05-09 DIAGNOSIS — F17210 Nicotine dependence, cigarettes, uncomplicated: Secondary | ICD-10-CM | POA: Diagnosis not present

## 2018-05-09 DIAGNOSIS — R079 Chest pain, unspecified: Secondary | ICD-10-CM

## 2018-05-09 DIAGNOSIS — R55 Syncope and collapse: Secondary | ICD-10-CM | POA: Diagnosis not present

## 2018-05-09 DIAGNOSIS — J45909 Unspecified asthma, uncomplicated: Secondary | ICD-10-CM | POA: Diagnosis not present

## 2018-05-09 DIAGNOSIS — N186 End stage renal disease: Secondary | ICD-10-CM | POA: Diagnosis not present

## 2018-05-09 DIAGNOSIS — Z992 Dependence on renal dialysis: Secondary | ICD-10-CM | POA: Diagnosis not present

## 2018-05-09 DIAGNOSIS — E1122 Type 2 diabetes mellitus with diabetic chronic kidney disease: Secondary | ICD-10-CM | POA: Insufficient documentation

## 2018-05-09 DIAGNOSIS — I120 Hypertensive chronic kidney disease with stage 5 chronic kidney disease or end stage renal disease: Secondary | ICD-10-CM | POA: Insufficient documentation

## 2018-05-09 DIAGNOSIS — Z7982 Long term (current) use of aspirin: Secondary | ICD-10-CM | POA: Diagnosis not present

## 2018-05-09 DIAGNOSIS — N2581 Secondary hyperparathyroidism of renal origin: Secondary | ICD-10-CM | POA: Diagnosis not present

## 2018-05-09 DIAGNOSIS — R0789 Other chest pain: Secondary | ICD-10-CM | POA: Diagnosis not present

## 2018-05-09 LAB — BASIC METABOLIC PANEL
Anion gap: 12 (ref 5–15)
BUN: 12 mg/dL (ref 8–23)
CALCIUM: 8.5 mg/dL — AB (ref 8.9–10.3)
CHLORIDE: 93 mmol/L — AB (ref 98–111)
CO2: 33 mmol/L — ABNORMAL HIGH (ref 22–32)
Creatinine, Ser: 4.22 mg/dL — ABNORMAL HIGH (ref 0.44–1.00)
GFR calc Af Amer: 11 mL/min — ABNORMAL LOW (ref >60)
GFR calc non Af Amer: 10 mL/min — ABNORMAL LOW (ref >60)
Glucose, Bld: 102 mg/dL — ABNORMAL HIGH (ref 70–99)
Potassium: 3.8 mmol/L (ref 3.5–5.1)
Sodium: 138 mmol/L (ref 135–145)

## 2018-05-09 LAB — CBC WITH DIFFERENTIAL/PLATELET
Abs Immature Granulocytes: 0.01 10*3/uL (ref 0.00–0.07)
Basophils Absolute: 0.1 10*3/uL (ref 0.0–0.1)
Basophils Relative: 1 %
Eosinophils Absolute: 0.2 10*3/uL (ref 0.0–0.5)
Eosinophils Relative: 4 %
HCT: 39.3 % (ref 36.0–46.0)
Hemoglobin: 12.2 g/dL (ref 12.0–15.0)
Immature Granulocytes: 0 %
Lymphocytes Relative: 28 %
Lymphs Abs: 1.4 10*3/uL (ref 0.7–4.0)
MCH: 30.4 pg (ref 26.0–34.0)
MCHC: 31 g/dL (ref 30.0–36.0)
MCV: 98 fL (ref 80.0–100.0)
Monocytes Absolute: 0.4 10*3/uL (ref 0.1–1.0)
Monocytes Relative: 8 %
NEUTROS PCT: 59 %
Neutro Abs: 3.1 10*3/uL (ref 1.7–7.7)
Platelets: 231 10*3/uL (ref 150–400)
RBC: 4.01 MIL/uL (ref 3.87–5.11)
RDW: 14.5 % (ref 11.5–15.5)
WBC: 5.2 10*3/uL (ref 4.0–10.5)
nRBC: 0 % (ref 0.0–0.2)

## 2018-05-09 LAB — I-STAT TROPONIN, ED
Troponin i, poc: 0.01 ng/mL (ref 0.00–0.08)
Troponin i, poc: 0 ng/mL (ref 0.00–0.08)

## 2018-05-09 NOTE — ED Notes (Signed)
Patient transported to X-ray 

## 2018-05-09 NOTE — ED Triage Notes (Signed)
Pt was at dialysis with 30 minutes left when she developed chest pain. Pt reports that it was in the upper left part of her chest and it was a sharp pain. Pt denies any pain at this time. EMS did put patient on 2L  for comfort.

## 2018-05-09 NOTE — ED Provider Notes (Signed)
[facility] EMERGENCY DEPARTMENT Provider Note   CSN: [id number] Arrival date & time: 05/09/18  1046     History   Chief Complaint Chief Complaint  Patient presents with  . Chest Pain    HPI [name] is a 73 y.o. female.  Patient is a 73 year old female with history of diabetes and end-stage renal disease on hemodialysis for the past 12 months.  She presents today with complaints of chest pain.  She was completing her dialysis session when she reported a sharp pain to the center of her chest.  There was no associated nausea, diaphoresis, shortness of breath, or radiation.  This lasted approximately 30 minutes and resolved as she was being transported here.  She denies any recent injury or trauma.  She denies any prior history of heart disease.  The history is provided by the patient.  Chest Pain  Pain location:  Substernal area Pain quality: sharp   Pain radiates to:  Does not radiate Pain severity:  Moderate Onset quality:  Sudden Duration:  30 minutes Timing:  Constant Progression:  Resolved Chronicity:  New Relieved by:  Nothing Worsened by:  Nothing Ineffective treatments:  None tried   Past Medical History:  Diagnosis Date  . Anemia   . Anxiety   . Arthritis    lower back and knees   . Asthma    childhood  . Barrett's esophagus   . Chronic kidney disease   . Coagulation defect ([place])   . Depression   . Diabetes mellitus   . Dysphagia   . Eczema   . End stage renal disease ([place])   . Fluid overload, unspecified   . History of herpes zoster 02/2010   Recovered fully after period of acute herpetic neuralgia tx w/ gabapentin.   Marland Kitchen Hx MRSA infection 2009  . Hypercalcemia   . Hyperlipidemia   . Hypertension   . Moderate protein-calorie malnutrition ([place])   . Neuromuscular disorder (HCC)    chronic pain  . Personal history of colonic polyps 10/17/2010   hyperplastic    Patient Active Problem List   Diagnosis Date Noted  .  Pulmonary edema, acute ([place]) 05/05/2017  . Acute respiratory failure with hypoxia ([place]) 05/05/2017  . Hyperkalemia 05/05/2017  . Anemia due to chronic kidney disease 05/05/2017  . Hypertensive urgency 05/05/2017  . Hypoxia   . Pneumonia 03/10/2017  . CKD (chronic kidney disease) stage 5, GFR less than 15 ml/min (HCC) 12/03/2016  . CVA (cerebral vascular accident) ([place]) 12/03/2016  . Stroke (cerebrum) ([place]) 12/02/2016  . Memory changes 05/29/2016  . Stroke syndrome 05/29/2016  . Mild single current episode of major depressive disorder ([place]) 05/29/2016  . Osteoarthritis of right knee 02/14/2016  . Current smoker 01/06/2016  . Headache 06/24/2015  . Migraine 06/24/2015  . Chronic knee pain 10/13/2014  . Eczema   . Arthritis   . Anxiety   . Iron deficiency anemia due to chronic blood loss 09/05/2013  . Microalbuminuria 09/05/2013  . Toe pain, left 08/05/2013  . Lipoma of back 07/31/2013  . Tobacco abuse 10/18/2012  . Dermatitis 04/30/2012  . Barrett esophagus 11/02/2010  . GERD (gastroesophageal reflux disease) with acute nausea  09/29/2010  . DM (diabetes mellitus) type II controlled, neurological manifestation ([place]) 09/29/2010  . HERPES ZOSTER 03/25/2010  . Hyperlipidemia 10/30/2008  . Depression 07/16/2008  . INSOMNIA UNSPECIFIED 12/19/2007  . Essential hypertension 11/04/2007    Past Surgical History:  Procedure Laterality Date  . ABDOMINAL HYSTERECTOMY  unclear when  . AV FISTULA PLACEMENT Left 02/13/2017   Procedure: ARTERIOVENOUS (AV) GORE-TEX STRETCH GRAFT INSERTION INTO LEFT ARM;  Surgeon: Elam Dutch, MD;  Location: [facility];  Service: Vascular;  Laterality: Left;  . CATARACT EXTRACTION Bilateral   . thigh surg     left side due to MRSA     OB History   No obstetric history on file.      Home Medications    Prior to Admission medications   Medication Sig Start Date End Date Taking? Authorizing Provider  aspirin 81 MG chewable tablet Chew 1 tablet  (81 mg total) by mouth daily. 05/12/17   Regalado, Belkys A, MD  calcium acetate (PHOSLO) 667 MG capsule Take by mouth 3 (three) times daily with meals.    [provider]  cinacalcet (SENSIPAR) 30 MG tablet Take 30 mg by mouth daily. 12/06/17   [provider]  clopidogrel (PLAVIX) 75 MG tablet TAKE 1 TABLET (75 MG TOTAL) BY MOUTH DAILY. DO NOT USE BLISTER PACKS 04/29/18   [name], NP  ezetimibe (ZETIA) 10 MG tablet Take 1 tablet (10 mg total) by mouth daily. 03/04/18   [name], NP  latanoprost (XALATAN) 0.005 % ophthalmic solution Place 1 drop into both eyes at bedtime. Reported on 08/27/2015 04/23/12   [provider]  pantoprazole (PROTONIX) 40 MG tablet Take 1 tablet (40 mg total) by mouth daily. 01/30/18   [name], NP  PARoxetine (PAXIL) 40 MG tablet TAKE 1 TABLET BY MOUTH EVERY DAY 03/07/18   [name], NP  pravastatin (PRAVACHOL) 40 MG tablet TAKE 1 TABLET (40 MG TOTAL) BY MOUTH AT BEDTIME. 01/07/18   [name], NP  sevelamer carbonate (RENVELA) 2.4 g PACK 2.4 g 3 (three) times daily with meals.    [provider]  traZODone (DESYREL) 50 MG tablet Take 1 tablet (50 mg total) by mouth at bedtime. For sleep 03/04/18   [name], NP  triamcinolone cream (KENALOG) 0.1 % APPLY 1 APPLICATION TOPICALLY 3 (THREE) TIMES DAILY AS NEEDED (RASH). 03/29/18   Gayland Curry, DO    Family History Family History  Problem Relation Age of Onset  . Hypertension Father   . Kidney disease Father   . Diabetes Brother   . Colon cancer Neg Hx   . CAD Neg Hx     Social History Social History   Tobacco Use  . Smoking status: Current Every Day Smoker    Packs/day: 1.00    Years: 40.00    Pack years: 40.00    Types: Cigarettes  . Smokeless tobacco: Never Used  . Tobacco comment: smokes 1 pack of cigerettes a week- off and on  Substance Use Topics  . Alcohol use: Yes    Alcohol/week: 2.0 standard drinks    Types:  2 Standard drinks or equivalent per week    Comment: wine and beer- occasionally   . Drug use: No     Allergies   Lisinopril; Pioglitazone; Morphine and related; and Sulfonamide derivatives   Review of Systems Review of Systems  Cardiovascular: Positive for chest pain.  All other systems reviewed and are negative.    Physical Exam Updated Vital Signs BP 136/77 (BP Location: Right Arm)   Pulse 72   Temp 97.6 F (36.4 C) (Oral)   Resp (!) 22   Ht 5\' 5"  (1.651 m)   Wt 78 kg   SpO2 96%   BMI 28.62 kg/m  Physical Exam Vitals signs and nursing note reviewed.  Constitutional:      General: She is not in acute distress.    Appearance: She is well-developed. She is not diaphoretic.  HENT:     Head: Normocephalic and atraumatic.  Neck:     Musculoskeletal: Normal range of motion and neck supple.  Cardiovascular:     Rate and Rhythm: Normal rate and regular rhythm.     Heart sounds: No murmur. No friction rub. No gallop.   Pulmonary:     Effort: Pulmonary effort is normal. No respiratory distress.     Breath sounds: Normal breath sounds. No wheezing.  Abdominal:     General: Bowel sounds are normal. There is no distension.     Palpations: Abdomen is soft.     Tenderness: There is no abdominal tenderness.  Musculoskeletal: Normal range of motion.     Right lower leg: She exhibits no tenderness. No edema.     Left lower leg: She exhibits no tenderness. No edema.  Skin:    General: Skin is warm and dry.  Neurological:     Mental Status: She is alert and oriented to person, place, and time.      ED Treatments / Results  Labs (all labs ordered are listed, but only abnormal results are displayed) Labs Reviewed  BASIC METABOLIC PANEL  CBC WITH DIFFERENTIAL/PLATELET  I-STAT TROPONIN, ED    EKG EKG Interpretation  Date/Time:  Thursday May 09 2018 10:51:45 EST Ventricular Rate:  72 PR Interval:    QRS Duration: 96 QT Interval:  413 QTC Calculation: 452 R  Axis:   -9 Text Interpretation:  Sinus rhythm Probable left atrial enlargement Confirmed by Veryl Speak [phone]) on 05/09/2018 10:55:58 AM   Radiology No results found.  Procedures Procedures (including critical care time)  Medications Ordered in ED Medications - No data to display   Initial Impression / Assessment and Plan / ED Course  I have reviewed the triage vital signs and the nursing notes.  Pertinent labs & imaging results that were available during my care of the patient were reviewed by me and considered in my medical decision making (see chart for details).  Patient presents here with complaints of chest discomfort that began while she was at dialysis.  The pain was sharp in nature and lasted approximately 30 minutes.  Symptoms atypical for cardiac pain.  Patient feeling better here in the ER.  EKG is unchanged and troponin x2 is negative.  The patient is very adamant about going home.  She will go to her dialysis session on Saturday and scheduled and return for any problems.  Final Clinical Impressions(s) / ED Diagnoses   Final diagnoses:  None    ED Discharge Orders    None       Veryl Speak, MD 05/09/18 1540

## 2018-05-09 NOTE — ED Notes (Signed)
Phlebotomist reported to me that she attempted to draw blood, however phlebotomists stated pt "had bad attitude" so she was unable to get blood. RN informed.

## 2018-05-09 NOTE — Discharge Instructions (Signed)
Continue your dialysis sessions as previously arranged.  Return to the emergency department if you develop worsening pain, high fever, difficulty breathing, or other new and concerning symptoms.

## 2018-05-11 DIAGNOSIS — N2581 Secondary hyperparathyroidism of renal origin: Secondary | ICD-10-CM | POA: Diagnosis not present

## 2018-05-11 DIAGNOSIS — N186 End stage renal disease: Secondary | ICD-10-CM | POA: Diagnosis not present

## 2018-05-14 DIAGNOSIS — N186 End stage renal disease: Secondary | ICD-10-CM | POA: Diagnosis not present

## 2018-05-14 DIAGNOSIS — N2581 Secondary hyperparathyroidism of renal origin: Secondary | ICD-10-CM | POA: Diagnosis not present

## 2018-05-16 DIAGNOSIS — N186 End stage renal disease: Secondary | ICD-10-CM | POA: Diagnosis not present

## 2018-05-16 DIAGNOSIS — N2581 Secondary hyperparathyroidism of renal origin: Secondary | ICD-10-CM | POA: Diagnosis not present

## 2018-05-17 ENCOUNTER — Ambulatory Visit (INDEPENDENT_AMBULATORY_CARE_PROVIDER_SITE_OTHER): Payer: Medicare HMO | Admitting: Nurse Practitioner

## 2018-05-17 ENCOUNTER — Encounter: Payer: Self-pay | Admitting: Nurse Practitioner

## 2018-05-17 VITALS — BP 132/76 | HR 93 | Temp 97.8°F | Resp 10 | Ht 65.0 in | Wt 185.0 lb

## 2018-05-17 DIAGNOSIS — G4700 Insomnia, unspecified: Secondary | ICD-10-CM | POA: Diagnosis not present

## 2018-05-17 DIAGNOSIS — Z794 Long term (current) use of insulin: Secondary | ICD-10-CM

## 2018-05-17 DIAGNOSIS — N186 End stage renal disease: Secondary | ICD-10-CM | POA: Diagnosis not present

## 2018-05-17 DIAGNOSIS — E1149 Type 2 diabetes mellitus with other diabetic neurological complication: Secondary | ICD-10-CM | POA: Diagnosis not present

## 2018-05-17 DIAGNOSIS — Z992 Dependence on renal dialysis: Secondary | ICD-10-CM

## 2018-05-17 MED ORDER — TRIAMCINOLONE ACETONIDE 0.1 % EX CREA: 1.0000 "application " | TOPICAL_CREAM | Freq: Three times a day (TID) | CUTANEOUS | 1 refills | Status: DC | PRN

## 2018-05-17 MED ORDER — TRAZODONE HCL 50 MG PO TABS: 25.0000 mg | ORAL_TABLET | Freq: Every day | ORAL | 3 refills | Status: DC

## 2018-05-17 NOTE — Progress Notes (Signed)
Careteam: Patient Care Team: [name], NP as PCP - General (Nurse Practitioner) [name], MD as Consulting Physician (Ophthalmology) [name], MD as Consulting Physician (Nephrology) [name], NP as Nurse Practitioner (Nurse Practitioner)  Advanced Directive information    Allergies  Allergen Reactions  . Lisinopril Other (See Comments)    Abnormal Kidney Function   . Pioglitazone Other (See Comments)    REACTION: Desquamation of skin of the palm  . Morphine And Related Nausea And Vomiting  . Sulfonamide Derivatives Rash    Chief Complaint  Patient presents with  . Follow-up    6 week follow-up on new medication- Trazodone. Pateint states Trazodone give her a headache and only allows her 3-4 hours of sleep   . Medication Refill    Cream fro rash- [facility]   . Quality Metric Gaps    Will see Dr.Groat 06/25/2018  . Best Practice Recommendations    Discuss need for MALB, Hep C screening and Foot exam due      HPI: Patient is a 73 y.o. female seen in the office today to follow up insomnia.  She was started on trazodone which has been benfitical. Sleeping from 4-6 pm to 4 am  Reports she notices a headache in the morning when she wakes up. Last for ~1 hour and then goes away on its own. Mild. Noticed it when she started her trazodone.   Review of Systems:  Review of Systems  Constitutional: Negative for chills, fever and weight loss.  HENT: Negative for tinnitus.   Respiratory: Negative for cough, sputum production and shortness of breath.   Cardiovascular: Negative for chest pain, palpitations and leg swelling.  Gastrointestinal: Negative for abdominal pain, constipation, diarrhea and heartburn.  Genitourinary: Negative for urgency.  Skin: Negative.   Neurological: Positive for headaches. Negative for dizziness.  Endo/Heme/Allergies:       Hot flashes  Psychiatric/Behavioral: Negative for depression, memory loss and suicidal ideas. The  patient is not nervous/anxious and does not have insomnia.        Improvement in sleep and mood.    Past Medical History:  Diagnosis Date  . Anemia   . Anxiety   . Arthritis    lower back and knees   . Asthma    childhood  . Barrett's esophagus   . Chronic kidney disease   . Coagulation defect ([place])   . Depression   . Diabetes mellitus   . Dysphagia   . Eczema   . End stage renal disease ([place])   . Fluid overload, unspecified   . History of herpes zoster 02/2010   Recovered fully after period of acute herpetic neuralgia tx w/ gabapentin.   Marland Kitchen Hx MRSA infection 2009  . Hypercalcemia   . Hyperlipidemia   . Hypertension   . Moderate protein-calorie malnutrition ([place])   . Neuromuscular disorder ([facility])    chronic pain  . Personal history of colonic polyps 10/17/2010   hyperplastic   Past Surgical History:  Procedure Laterality Date  . ABDOMINAL HYSTERECTOMY     unclear when  . AV FISTULA PLACEMENT Left 02/13/2017   Procedure: ARTERIOVENOUS (AV) GORE-TEX STRETCH GRAFT INSERTION INTO LEFT ARM;  Surgeon: Elam Dutch, MD;  Location: [facility];  Service: Vascular;  Laterality: Left;  . CATARACT EXTRACTION Bilateral   . thigh surg     left side due to MRSA   Social History:   reports that she has been smoking cigarettes. She has a 40.00 pack-year  smoking history. She has never used smokeless tobacco. She reports current alcohol use of about 2.0 standard drinks of alcohol per week. She reports that she does not use drugs.  Family History  Problem Relation Age of Onset  . Hypertension Father   . Kidney disease Father   . Diabetes Brother   . Colon cancer Neg Hx   . CAD Neg Hx     Medications: Patient's Medications  New Prescriptions   No medications on file  Previous Medications   ASPIRIN 81 MG CHEWABLE TABLET    Chew 1 tablet (81 mg total) by mouth daily.   CALCIUM ACETATE (PHOSLO) 667 MG CAPSULE    Take by mouth 3 (three) times daily with meals.   CINACALCET  (SENSIPAR) 30 MG TABLET    Take 30 mg by mouth daily.   CLOPIDOGREL (PLAVIX) 75 MG TABLET    TAKE 1 TABLET (75 MG TOTAL) BY MOUTH DAILY. DO NOT USE BLISTER PACKS   EZETIMIBE (ZETIA) 10 MG TABLET    Take 1 tablet (10 mg total) by mouth daily.   LATANOPROST (XALATAN) 0.005 % OPHTHALMIC SOLUTION    Place 1 drop into both eyes at bedtime. Reported on 08/27/2015   PANTOPRAZOLE (PROTONIX) 40 MG TABLET    Take 1 tablet (40 mg total) by mouth daily.   PAROXETINE (PAXIL) 40 MG TABLET    TAKE 1 TABLET BY MOUTH EVERY DAY   PRAVASTATIN (PRAVACHOL) 40 MG TABLET    TAKE 1 TABLET (40 MG TOTAL) BY MOUTH AT BEDTIME.   SEVELAMER CARBONATE (RENVELA) 2.4 G PACK    2.4 g 3 (three) times daily with meals.   TRAZODONE (DESYREL) 50 MG TABLET    Take 1 tablet (50 mg total) by mouth at bedtime. For sleep   TRIAMCINOLONE CREAM (KENALOG) 0.1 %    APPLY 1 APPLICATION TOPICALLY 3 (THREE) TIMES DAILY AS NEEDED (RASH).  Modified Medications   No medications on file  Discontinued Medications   No medications on file     Physical Exam:  Vitals:   05/17/18 0831  BP: 132/76  Pulse: 93  Resp: 10  Temp: 97.8 F (36.6 C)  TempSrc: Oral  SpO2: 93%  Weight: 185 lb (83.9 kg)  Height: 5\' 5"  (1.651 m)   Body mass index is 30.79 kg/m.  Physical Exam Constitutional:      General: She is not in acute distress.    Appearance: She is well-developed.  HENT:     Head: Normocephalic and atraumatic.  Eyes:     Conjunctiva/sclera: Conjunctivae normal.     Pupils: Pupils are equal, round, and reactive to light.  Cardiovascular:     Rate and Rhythm: Normal rate and regular rhythm.     Heart sounds: Normal heart sounds. No murmur.  Pulmonary:     Effort: Pulmonary effort is normal. No respiratory distress.     Breath sounds: Normal breath sounds. No wheezing.  Skin:    General: Skin is warm and dry.  Neurological:     Mental Status: She is alert and oriented to person, place, and time.  Psychiatric:        Mood and  Affect: Affect is blunt.        Behavior: Behavior normal.        Thought Content: Thought content normal.        Judgment: Judgment normal.     Comments: Mood improved today     Labs reviewed: Basic Metabolic Panel: Recent Labs  09/20/17  03/04/18 0160 03/12/18 04/11/18 05/09/18 1114  NA 142  --   --   --   --  138  K 4.3   < >  --  4.6 4.6 3.8  CL  --   --   --   --   --  93*  CO2  --   --   --   --   --  33*  GLUCOSE  --   --   --   --   --  102*  BUN  --   --   --   --   --  12  CREATININE 8.8*  --   --   --   --  4.22*  CALCIUM  --   --   --   --   --  8.5*  TSH  --   --  1.57  --   --   --    < > = values in this interval not displayed.   Liver Function Tests: Recent Labs    03/04/18 0923  AST 13  ALT 14  BILITOT 0.4  PROT 6.7   No results for input(s): LIPASE, AMYLASE in the last 8760 hours. No results for input(s): AMMONIA in the last 8760 hours. CBC: Recent Labs    09/20/17  03/12/18 04/11/18 05/09/18 1114  WBC 8.0  --   --   --  5.2  NEUTROABS  --   --   --   --  3.1  HGB 10.6*   < > 9.5* 10.8* 12.2  HCT  --   --   --   --  39.3  MCV  --   --   --   --  98.0  PLT 198  --   --   --  231   < > = values in this interval not displayed.   Lipid Panel: Recent Labs    07/02/17 0830  CHOL 162  HDL 51  LDLCALC 89  TRIG 120  CHOLHDL 3.2   TSH: Recent Labs    03/04/18 0923  TSH 1.57   A1C: Lab Results  Component Value Date   HGBA1C 5.5 01/22/2018     Assessment/Plan 1. Insomnia, unspecified type Improved. Sleeping ~10 hours at night.  -will reduce trazodone to help with headaches.  - traZODone (DESYREL) 50 MG tablet; Take 0.5 tablets (25 mg total) by mouth at bedtime. For sleep  Dispense: 30 tablet; Refill: 3  2. 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 Encouraged dietary compliance, routine foot care/monitoring and to keep up  with diabetic eye exams through ophthalmology. Will continue current regimen  3. ESRD (end stage renal disease) on dialysis ([place]) Does not qualify for microalbumin  as she is on HD  Next appt: 3 month for routine follow up.  Carlos American. [place], [facility] [phone]

## 2018-05-17 NOTE — Patient Instructions (Addendum)
Decrease trazodone to 25 mg by mouth daily at bedtime.   3 months follow up with Dr Mariea Clonts for routine follow up.

## 2018-05-18 DIAGNOSIS — N2581 Secondary hyperparathyroidism of renal origin: Secondary | ICD-10-CM | POA: Diagnosis not present

## 2018-05-18 DIAGNOSIS — N186 End stage renal disease: Secondary | ICD-10-CM | POA: Diagnosis not present

## 2018-05-21 DIAGNOSIS — N186 End stage renal disease: Secondary | ICD-10-CM | POA: Diagnosis not present

## 2018-05-21 DIAGNOSIS — N2581 Secondary hyperparathyroidism of renal origin: Secondary | ICD-10-CM | POA: Diagnosis not present

## 2018-05-23 DIAGNOSIS — N186 End stage renal disease: Secondary | ICD-10-CM | POA: Diagnosis not present

## 2018-05-23 DIAGNOSIS — N2581 Secondary hyperparathyroidism of renal origin: Secondary | ICD-10-CM | POA: Diagnosis not present

## 2018-05-24 DIAGNOSIS — N186 End stage renal disease: Secondary | ICD-10-CM | POA: Diagnosis not present

## 2018-05-24 DIAGNOSIS — Z992 Dependence on renal dialysis: Secondary | ICD-10-CM | POA: Diagnosis not present

## 2018-05-24 DIAGNOSIS — E1129 Type 2 diabetes mellitus with other diabetic kidney complication: Secondary | ICD-10-CM | POA: Diagnosis not present

## 2018-05-25 DIAGNOSIS — N186 End stage renal disease: Secondary | ICD-10-CM | POA: Diagnosis not present

## 2018-05-25 DIAGNOSIS — N2581 Secondary hyperparathyroidism of renal origin: Secondary | ICD-10-CM | POA: Diagnosis not present

## 2018-05-28 DIAGNOSIS — N186 End stage renal disease: Secondary | ICD-10-CM | POA: Diagnosis not present

## 2018-05-28 DIAGNOSIS — N2581 Secondary hyperparathyroidism of renal origin: Secondary | ICD-10-CM | POA: Diagnosis not present

## 2018-05-30 DIAGNOSIS — N2581 Secondary hyperparathyroidism of renal origin: Secondary | ICD-10-CM | POA: Diagnosis not present

## 2018-05-30 DIAGNOSIS — N186 End stage renal disease: Secondary | ICD-10-CM | POA: Diagnosis not present

## 2018-06-01 DIAGNOSIS — N186 End stage renal disease: Secondary | ICD-10-CM | POA: Diagnosis not present

## 2018-06-01 DIAGNOSIS — N2581 Secondary hyperparathyroidism of renal origin: Secondary | ICD-10-CM | POA: Diagnosis not present

## 2018-06-04 DIAGNOSIS — N186 End stage renal disease: Secondary | ICD-10-CM | POA: Diagnosis not present

## 2018-06-04 DIAGNOSIS — N2581 Secondary hyperparathyroidism of renal origin: Secondary | ICD-10-CM | POA: Diagnosis not present

## 2018-06-06 DIAGNOSIS — N2581 Secondary hyperparathyroidism of renal origin: Secondary | ICD-10-CM | POA: Diagnosis not present

## 2018-06-06 DIAGNOSIS — N186 End stage renal disease: Secondary | ICD-10-CM | POA: Diagnosis not present

## 2018-06-08 DIAGNOSIS — N186 End stage renal disease: Secondary | ICD-10-CM | POA: Diagnosis not present

## 2018-06-08 DIAGNOSIS — N2581 Secondary hyperparathyroidism of renal origin: Secondary | ICD-10-CM | POA: Diagnosis not present

## 2018-06-11 DIAGNOSIS — N186 End stage renal disease: Secondary | ICD-10-CM | POA: Diagnosis not present

## 2018-06-11 DIAGNOSIS — N2581 Secondary hyperparathyroidism of renal origin: Secondary | ICD-10-CM | POA: Diagnosis not present

## 2018-06-13 DIAGNOSIS — N2581 Secondary hyperparathyroidism of renal origin: Secondary | ICD-10-CM | POA: Diagnosis not present

## 2018-06-13 DIAGNOSIS — N186 End stage renal disease: Secondary | ICD-10-CM | POA: Diagnosis not present

## 2018-06-15 DIAGNOSIS — N186 End stage renal disease: Secondary | ICD-10-CM | POA: Diagnosis not present

## 2018-06-15 DIAGNOSIS — N2581 Secondary hyperparathyroidism of renal origin: Secondary | ICD-10-CM | POA: Diagnosis not present

## 2018-06-18 DIAGNOSIS — N186 End stage renal disease: Secondary | ICD-10-CM | POA: Diagnosis not present

## 2018-06-18 DIAGNOSIS — N2581 Secondary hyperparathyroidism of renal origin: Secondary | ICD-10-CM | POA: Diagnosis not present

## 2018-06-20 DIAGNOSIS — N186 End stage renal disease: Secondary | ICD-10-CM | POA: Diagnosis not present

## 2018-06-20 DIAGNOSIS — N2581 Secondary hyperparathyroidism of renal origin: Secondary | ICD-10-CM | POA: Diagnosis not present

## 2018-06-22 DIAGNOSIS — N2581 Secondary hyperparathyroidism of renal origin: Secondary | ICD-10-CM | POA: Diagnosis not present

## 2018-06-22 DIAGNOSIS — E1129 Type 2 diabetes mellitus with other diabetic kidney complication: Secondary | ICD-10-CM | POA: Diagnosis not present

## 2018-06-22 DIAGNOSIS — N186 End stage renal disease: Secondary | ICD-10-CM | POA: Diagnosis not present

## 2018-06-22 DIAGNOSIS — Z992 Dependence on renal dialysis: Secondary | ICD-10-CM | POA: Diagnosis not present

## 2018-06-25 DIAGNOSIS — N2581 Secondary hyperparathyroidism of renal origin: Secondary | ICD-10-CM | POA: Diagnosis not present

## 2018-06-25 DIAGNOSIS — N186 End stage renal disease: Secondary | ICD-10-CM | POA: Diagnosis not present

## 2018-06-27 DIAGNOSIS — N186 End stage renal disease: Secondary | ICD-10-CM | POA: Diagnosis not present

## 2018-06-27 DIAGNOSIS — N2581 Secondary hyperparathyroidism of renal origin: Secondary | ICD-10-CM | POA: Diagnosis not present

## 2018-06-29 DIAGNOSIS — N2581 Secondary hyperparathyroidism of renal origin: Secondary | ICD-10-CM | POA: Diagnosis not present

## 2018-06-29 DIAGNOSIS — N186 End stage renal disease: Secondary | ICD-10-CM | POA: Diagnosis not present

## 2018-07-02 DIAGNOSIS — N2581 Secondary hyperparathyroidism of renal origin: Secondary | ICD-10-CM | POA: Diagnosis not present

## 2018-07-02 DIAGNOSIS — N186 End stage renal disease: Secondary | ICD-10-CM | POA: Diagnosis not present

## 2018-07-04 DIAGNOSIS — N2581 Secondary hyperparathyroidism of renal origin: Secondary | ICD-10-CM | POA: Diagnosis not present

## 2018-07-04 DIAGNOSIS — N186 End stage renal disease: Secondary | ICD-10-CM | POA: Diagnosis not present

## 2018-07-05 DIAGNOSIS — H401132 Primary open-angle glaucoma, bilateral, moderate stage: Secondary | ICD-10-CM | POA: Diagnosis not present

## 2018-07-05 DIAGNOSIS — Z961 Presence of intraocular lens: Secondary | ICD-10-CM | POA: Diagnosis not present

## 2018-07-05 DIAGNOSIS — H10413 Chronic giant papillary conjunctivitis, bilateral: Secondary | ICD-10-CM | POA: Diagnosis not present

## 2018-07-06 DIAGNOSIS — N186 End stage renal disease: Secondary | ICD-10-CM | POA: Diagnosis not present

## 2018-07-06 DIAGNOSIS — N2581 Secondary hyperparathyroidism of renal origin: Secondary | ICD-10-CM | POA: Diagnosis not present

## 2018-07-09 DIAGNOSIS — N2581 Secondary hyperparathyroidism of renal origin: Secondary | ICD-10-CM | POA: Diagnosis not present

## 2018-07-09 DIAGNOSIS — N186 End stage renal disease: Secondary | ICD-10-CM | POA: Diagnosis not present

## 2018-07-11 DIAGNOSIS — N186 End stage renal disease: Secondary | ICD-10-CM | POA: Diagnosis not present

## 2018-07-11 DIAGNOSIS — N2581 Secondary hyperparathyroidism of renal origin: Secondary | ICD-10-CM | POA: Diagnosis not present

## 2018-07-13 DIAGNOSIS — N2581 Secondary hyperparathyroidism of renal origin: Secondary | ICD-10-CM | POA: Diagnosis not present

## 2018-07-13 DIAGNOSIS — N186 End stage renal disease: Secondary | ICD-10-CM | POA: Diagnosis not present

## 2018-07-16 DIAGNOSIS — N186 End stage renal disease: Secondary | ICD-10-CM | POA: Diagnosis not present

## 2018-07-16 DIAGNOSIS — N2581 Secondary hyperparathyroidism of renal origin: Secondary | ICD-10-CM | POA: Diagnosis not present

## 2018-07-18 DIAGNOSIS — N2581 Secondary hyperparathyroidism of renal origin: Secondary | ICD-10-CM | POA: Diagnosis not present

## 2018-07-18 DIAGNOSIS — N186 End stage renal disease: Secondary | ICD-10-CM | POA: Diagnosis not present

## 2018-07-20 DIAGNOSIS — N186 End stage renal disease: Secondary | ICD-10-CM | POA: Diagnosis not present

## 2018-07-20 DIAGNOSIS — N2581 Secondary hyperparathyroidism of renal origin: Secondary | ICD-10-CM | POA: Diagnosis not present

## 2018-07-23 DIAGNOSIS — Z992 Dependence on renal dialysis: Secondary | ICD-10-CM | POA: Diagnosis not present

## 2018-07-23 DIAGNOSIS — N2581 Secondary hyperparathyroidism of renal origin: Secondary | ICD-10-CM | POA: Diagnosis not present

## 2018-07-23 DIAGNOSIS — E1129 Type 2 diabetes mellitus with other diabetic kidney complication: Secondary | ICD-10-CM | POA: Diagnosis not present

## 2018-07-23 DIAGNOSIS — N186 End stage renal disease: Secondary | ICD-10-CM | POA: Diagnosis not present

## 2018-07-25 DIAGNOSIS — N2581 Secondary hyperparathyroidism of renal origin: Secondary | ICD-10-CM | POA: Diagnosis not present

## 2018-07-25 DIAGNOSIS — N186 End stage renal disease: Secondary | ICD-10-CM | POA: Diagnosis not present

## 2018-07-26 DIAGNOSIS — T82858A Stenosis of vascular prosthetic devices, implants and grafts, initial encounter: Secondary | ICD-10-CM | POA: Diagnosis not present

## 2018-07-26 DIAGNOSIS — N186 End stage renal disease: Secondary | ICD-10-CM | POA: Diagnosis not present

## 2018-07-26 DIAGNOSIS — Z992 Dependence on renal dialysis: Secondary | ICD-10-CM | POA: Diagnosis not present

## 2018-07-26 DIAGNOSIS — I871 Compression of vein: Secondary | ICD-10-CM | POA: Diagnosis not present

## 2018-07-27 DIAGNOSIS — N2581 Secondary hyperparathyroidism of renal origin: Secondary | ICD-10-CM | POA: Diagnosis not present

## 2018-07-27 DIAGNOSIS — N186 End stage renal disease: Secondary | ICD-10-CM | POA: Diagnosis not present

## 2018-07-28 ENCOUNTER — Other Ambulatory Visit: Payer: Self-pay | Admitting: Nurse Practitioner

## 2018-07-28 DIAGNOSIS — I639 Cerebral infarction, unspecified: Secondary | ICD-10-CM

## 2018-07-30 ENCOUNTER — Emergency Department (HOSPITAL_COMMUNITY): Payer: Medicare HMO

## 2018-07-30 ENCOUNTER — Encounter (HOSPITAL_COMMUNITY): Payer: Self-pay | Admitting: Emergency Medicine

## 2018-07-30 ENCOUNTER — Other Ambulatory Visit: Payer: Self-pay

## 2018-07-30 ENCOUNTER — Emergency Department (HOSPITAL_COMMUNITY)
Admission: EM | Admit: 2018-07-30 | Discharge: 2018-07-30 | Disposition: A | Discharge status: Home / Self Care | Payer: Medicare HMO | Attending: Emergency Medicine | Admitting: Emergency Medicine

## 2018-07-30 DIAGNOSIS — Y939 Activity, unspecified: Secondary | ICD-10-CM | POA: Insufficient documentation

## 2018-07-30 DIAGNOSIS — Y929 Unspecified place or not applicable: Secondary | ICD-10-CM | POA: Diagnosis not present

## 2018-07-30 DIAGNOSIS — R55 Syncope and collapse: Secondary | ICD-10-CM | POA: Diagnosis not present

## 2018-07-30 DIAGNOSIS — N2581 Secondary hyperparathyroidism of renal origin: Secondary | ICD-10-CM | POA: Diagnosis not present

## 2018-07-30 DIAGNOSIS — E119 Type 2 diabetes mellitus without complications: Secondary | ICD-10-CM | POA: Diagnosis not present

## 2018-07-30 DIAGNOSIS — S01512A Laceration without foreign body of oral cavity, initial encounter: Secondary | ICD-10-CM | POA: Diagnosis not present

## 2018-07-30 DIAGNOSIS — S0083XA Contusion of other part of head, initial encounter: Secondary | ICD-10-CM | POA: Diagnosis not present

## 2018-07-30 DIAGNOSIS — T679XXA Effect of heat and light, unspecified, initial encounter: Secondary | ICD-10-CM | POA: Diagnosis not present

## 2018-07-30 DIAGNOSIS — F17210 Nicotine dependence, cigarettes, uncomplicated: Secondary | ICD-10-CM | POA: Diagnosis not present

## 2018-07-30 DIAGNOSIS — W19XXXA Unspecified fall, initial encounter: Secondary | ICD-10-CM | POA: Diagnosis not present

## 2018-07-30 DIAGNOSIS — Y999 Unspecified external cause status: Secondary | ICD-10-CM | POA: Insufficient documentation

## 2018-07-30 DIAGNOSIS — I120 Hypertensive chronic kidney disease with stage 5 chronic kidney disease or end stage renal disease: Secondary | ICD-10-CM | POA: Diagnosis not present

## 2018-07-30 DIAGNOSIS — Z79899 Other long term (current) drug therapy: Secondary | ICD-10-CM | POA: Insufficient documentation

## 2018-07-30 DIAGNOSIS — Z7982 Long term (current) use of aspirin: Secondary | ICD-10-CM | POA: Insufficient documentation

## 2018-07-30 DIAGNOSIS — N185 Chronic kidney disease, stage 5: Secondary | ICD-10-CM | POA: Diagnosis not present

## 2018-07-30 DIAGNOSIS — K029 Dental caries, unspecified: Secondary | ICD-10-CM | POA: Diagnosis not present

## 2018-07-30 DIAGNOSIS — R220 Localized swelling, mass and lump, head: Secondary | ICD-10-CM | POA: Diagnosis not present

## 2018-07-30 DIAGNOSIS — M50222 Other cervical disc displacement at C5-C6 level: Secondary | ICD-10-CM | POA: Diagnosis not present

## 2018-07-30 DIAGNOSIS — N186 End stage renal disease: Secondary | ICD-10-CM | POA: Diagnosis not present

## 2018-07-30 DIAGNOSIS — R0902 Hypoxemia: Secondary | ICD-10-CM | POA: Diagnosis not present

## 2018-07-30 LAB — CBC
HCT: 39.7 % (ref 36.0–46.0)
Hemoglobin: 12.9 g/dL (ref 12.0–15.0)
MCH: 30.7 pg (ref 26.0–34.0)
MCHC: 32.5 g/dL (ref 30.0–36.0)
MCV: 94.5 fL (ref 80.0–100.0)
Platelets: 230 10*3/uL (ref 150–400)
RBC: 4.2 MIL/uL (ref 3.87–5.11)
RDW: 15.9 % — ABNORMAL HIGH (ref 11.5–15.5)
WBC: 7.8 10*3/uL (ref 4.0–10.5)
nRBC: 0 % (ref 0.0–0.2)

## 2018-07-30 LAB — BASIC METABOLIC PANEL
Anion gap: 15 (ref 5–15)
BUN: 18 mg/dL (ref 8–23)
CO2: 30 mmol/L (ref 22–32)
Calcium: 9.7 mg/dL (ref 8.9–10.3)
Chloride: 93 mmol/L — ABNORMAL LOW (ref 98–111)
Creatinine, Ser: 6.5 mg/dL — ABNORMAL HIGH (ref 0.44–1.00)
GFR calc Af Amer: 7 mL/min — ABNORMAL LOW (ref >60)
GFR calc non Af Amer: 6 mL/min — ABNORMAL LOW (ref >60)
Glucose, Bld: 126 mg/dL — ABNORMAL HIGH (ref 70–99)
Potassium: 3.9 mmol/L (ref 3.5–5.1)
Sodium: 138 mmol/L (ref 135–145)

## 2018-07-30 NOTE — Discharge Instructions (Signed)
Continue your current medications, follow-up with your doctor, and to the ED for any recurrent symptoms

## 2018-07-30 NOTE — ED Provider Notes (Signed)
[facility] EMERGENCY DEPARTMENT Provider Note   CSN: [id number] Arrival date & time: 07/30/18  1210    History   Chief Complaint Chief Complaint  Patient presents with   Fall    on blood thinners    HPI [name] is a 73 y.o. female.     HPI Patient presented to the emergency room for evaluation after a syncopal episode.  Patient states she had received dialysis today.  She went to sit outside when she started to feel hot sitting out in the sun..  Patient then had a syncopal episode apparently and struck her head on the ground.  The patient states she has had these type of episodes before after dialysis.  She feels fine now and would like to go home.  She denies any trouble with nausea vomiting.  No chest pain or shortness of breath.  No abdominal pain. Past Medical History:  Diagnosis Date   Anemia    Anxiety    Arthritis    lower back and knees    Asthma    childhood   Barrett's esophagus    Chronic kidney disease    Coagulation defect (HCC)    Depression    Diabetes mellitus    Dysphagia    Eczema    End stage renal disease (HCC)    Fluid overload, unspecified    History of herpes zoster 02/2010   Recovered fully after period of acute herpetic neuralgia tx w/ gabapentin.    Hx MRSA infection 2009   Hypercalcemia    Hyperlipidemia    Hypertension    Moderate protein-calorie malnutrition ([place])    Neuromuscular disorder ([place])    chronic pain   Personal history of colonic polyps 10/17/2010   hyperplastic    Patient Active Problem List   Diagnosis Date Noted   Pulmonary edema, acute ([place]) 05/05/2017   Acute respiratory failure with hypoxia ([place]) 05/05/2017   Hyperkalemia 05/05/2017   Anemia due to chronic kidney disease 05/05/2017   Hypertensive urgency 05/05/2017   Hypoxia    Pneumonia 03/10/2017   CKD (chronic kidney disease) stage 5, GFR less than 15 ml/min ([place]) 12/03/2016   CVA (cerebral vascular  accident) ([place]) 12/03/2016   Stroke (cerebrum) ([place]) 12/02/2016   Memory changes 05/29/2016   Stroke syndrome 05/29/2016   Mild single current episode of major depressive disorder ([place]) 05/29/2016   Osteoarthritis of right knee 02/14/2016   Current smoker 01/06/2016   Headache 06/24/2015   Migraine 06/24/2015   Chronic knee pain 10/13/2014   Eczema    Arthritis    Anxiety    Iron deficiency anemia due to chronic blood loss 09/05/2013   Microalbuminuria 09/05/2013   Toe pain, left 08/05/2013   Lipoma of back 07/31/2013   Tobacco abuse 10/18/2012   Dermatitis 04/30/2012   Barrett esophagus 11/02/2010   GERD (gastroesophageal reflux disease) with acute nausea  09/29/2010   DM (diabetes mellitus) type II controlled, 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      Home Medications    Prior to Admission medications   Medication Sig Start Date End Date Taking? Authorizing Provider  aspirin 81 MG chewable tablet Chew 1 tablet (81 mg total) by mouth daily. 05/12/17   Regalado, Belkys A, MD  calcium acetate (PHOSLO) 667 MG capsule Take by mouth 3 (three) times daily with meals.    [provider]  cinacalcet (SENSIPAR) 30 MG tablet Take 30 mg by mouth daily. 12/06/17   [provider]  clopidogrel (PLAVIX) 75 MG tablet TAKE 1 TABLET (75 MG TOTAL) BY MOUTH DAILY. DO  NOT USE BLISTER PACKS 07/29/18   [name], NP  ezetimibe (ZETIA) 10 MG tablet Take 1 tablet (10 mg total) by mouth daily. 03/04/18   [name], NP  latanoprost (XALATAN) 0.005 % ophthalmic solution Place 1 drop into both eyes at bedtime. Reported on 08/27/2015 04/23/12   [provider]  pantoprazole (PROTONIX) 40 MG tablet Take 1 tablet (40 mg total) by mouth daily. 01/30/18   [name], NP  PARoxetine (PAXIL) 40 MG tablet TAKE 1 TABLET BY MOUTH EVERY DAY 03/07/18   [name], NP  pravastatin (PRAVACHOL) 40 MG tablet TAKE 1 TABLET (40 MG TOTAL) BY MOUTH AT BEDTIME. 01/07/18   [name], NP  sevelamer carbonate (RENVELA) 2.4 g PACK 2.4 g 3 (three) times daily with meals.    [provider]  traZODone (DESYREL) 50 MG tablet Take 0.5 tablets (25 mg total) by mouth at bedtime. For sleep 05/17/18   [name], NP  triamcinolone cream (KENALOG) 0.1 % Apply 1 application topically 3 (three) times daily as needed (rash). 05/17/18   [name], NP    Family History Family History  Problem Relation Age of Onset   Hypertension Father    Kidney disease Father    Diabetes Brother    Colon cancer Neg Hx    CAD Neg Hx     Social History Social History   Tobacco Use   Smoking status: Current Every Day Smoker    Packs/day: 1.00    Years: 40.00    Pack years: 40.00    Types: Cigarettes   Smokeless tobacco: Never Used   Tobacco comment: smokes 1 pack of cigerettes a week- off and on  Substance Use Topics   Alcohol use: Yes    Alcohol/week: 2.0 standard drinks    Types: 2 Standard drinks or equivalent per week    Comment: wine and beer- occasionally    Drug use: No     Allergies   Lisinopril; Pioglitazone; Morphine and related; and Sulfonamide derivatives   Review of Systems Review of Systems  All other systems reviewed and are negative.    Physical Exam Updated Vital Signs BP 129/72    Pulse 82     Temp 97.7 F (36.5 C) (Oral)    Resp 15    Ht 1.651 m (5\' 5" )    Wt 74.8 kg    SpO2 98%    BMI 27.46 kg/m   Physical Exam Vitals signs and nursing note reviewed.  Constitutional:      General: She is not in acute distress.    Appearance: She is well-developed.  HENT:     Head: Normocephalic.     Comments: Contusion/abrasion of the chin, mild hematoma around the temple region  Right Ear: External ear normal.     Left Ear: External ear normal.  Eyes:     General: No scleral icterus.       Right eye: No discharge.        Left eye: No discharge.     Conjunctiva/sclera: Conjunctivae normal.     Comments: Small subconjunctival hemorrhage left eye  Neck:     Musculoskeletal: Neck supple.     Trachea: No tracheal deviation.  Cardiovascular:     Rate and Rhythm: Normal rate and regular rhythm.  Pulmonary:     Effort: Pulmonary effort is normal. No respiratory distress.     Breath sounds: Normal breath sounds. No stridor. No wheezing or rales.  Abdominal:     General: Bowel sounds are normal. There is no distension.     Palpations: Abdomen is soft.     Tenderness: There is no abdominal tenderness. There is no guarding or rebound.  Musculoskeletal:        General: No tenderness.     Right shoulder: She exhibits no tenderness, no bony tenderness and no swelling.     Left shoulder: She exhibits no tenderness, no bony tenderness and no swelling.     Right wrist: She exhibits no tenderness, no bony tenderness and no swelling.     Left wrist: She exhibits no tenderness, no bony tenderness and no swelling.     Right hip: She exhibits normal range of motion, no tenderness, no bony tenderness and no swelling.     Left hip: She exhibits normal range of motion, no tenderness and no bony tenderness.     Right ankle: She exhibits no swelling. No tenderness.     Left ankle: She exhibits no swelling. No tenderness.     Cervical back: She exhibits no tenderness, no bony tenderness and no swelling.       Thoracic back: She exhibits no tenderness, no bony tenderness and no swelling.     Lumbar back: She exhibits no tenderness, no bony tenderness and no swelling.  Skin:    General: Skin is warm and dry.     Findings: No rash.  Neurological:     Mental Status: She is alert.     Cranial Nerves: No cranial nerve deficit (no facial droop, extraocular movements intact, no slurred speech).     Sensory: No sensory deficit.     Motor: No abnormal muscle tone or seizure activity.     Coordination: Coordination normal.      ED Treatments / Results  Labs (all labs ordered are listed, but only abnormal results are displayed) Labs Reviewed  CBC - Abnormal; Notable for the following components:      Result Value   RDW 15.9 (*)    All other components within normal limits  BASIC METABOLIC PANEL - Abnormal; Notable for the following components:   Chloride 93 (*)    Glucose, Bld 126 (*)    Creatinine, Ser 6.50 (*)    GFR calc non Af Amer 6 (*)    GFR calc Af Amer 7 (*)    All other components within normal limits    EKG None  Radiology Ct Head Wo Contrast  Result Date: 07/30/2018 CLINICAL DATA:  Recent fainting episode with facial pain, initial encounter EXAM: CT HEAD WITHOUT CONTRAST CT MAXILLOFACIAL WITHOUT CONTRAST CT CERVICAL SPINE WITHOUT CONTRAST TECHNIQUE: Multidetector CT imaging of the head, cervical spine, and maxillofacial structures were performed using the standard protocol without intravenous contrast. Multiplanar CT image  reconstructions of the cervical spine and maxillofacial structures were also generated. COMPARISON:  05/10/2017 CT of the head, CT of the chest dated 01/19/2016 FINDINGS: CT HEAD FINDINGS Brain: Mild atrophic changes are noted. Chronic white matter ischemic changes are seen. Changes of prior bilateral parietal infarcts are again noted and stable. No findings to suggest acute hemorrhage, acute infarction or space-occupying mass lesion are noted. Vascular: No  hyperdense vessel or unexpected calcification. Skull: Normal. Negative for fracture or focal lesion. Other: None. CT MAXILLOFACIAL FINDINGS Osseous: No acute fractures are identified. There are changes consistent with significant dental caries. Periapical lucency is noted about the left mandibular canine. Orbits: Orbits and their contents are within normal limits. Sinuses: Paranasal sinuses are well aerated. Soft tissues: Mild soft tissue swelling is noted about chin slightly greater to the left of the midline. No other significant soft tissue abnormality is noted. CT CERVICAL SPINE FINDINGS Alignment: Within normal limits. Skull base and vertebrae: 7 cervical segments are well visualized. Vertebral body height is well maintained. Mild osteophytic changes are noted worst at C5-6 and C6-7. Facet hypertrophic changes are noted bilaterally. Soft tissues and spinal canal: Surrounding soft tissue structures are within normal limits. Upper chest: Visualized lung apices demonstrate a small 5 mm nodule in the left apex stable in appearance from prior CT examination. Other: None IMPRESSION: CT of the head: Chronic changes without acute abnormality. CT of the maxillofacial bones: Multiple dental caries and associated changes. No acute fracture is noted. Soft tissue swelling in the chin consistent with the recent injury. CT of the cervical spine: Degenerative change without acute abnormality. 5 mm nodule in the left apex stable from 2017 consistent with a benign etiology. Electronically Signed   By: Inez Catalina M.D.   On: 07/30/2018 14:50   Ct Cervical Spine Wo Contrast  Result Date: 07/30/2018 CLINICAL DATA:  Recent fainting episode with facial pain, initial encounter EXAM: CT HEAD WITHOUT CONTRAST CT MAXILLOFACIAL WITHOUT CONTRAST CT CERVICAL SPINE WITHOUT CONTRAST TECHNIQUE: Multidetector CT imaging of the head, cervical spine, and maxillofacial structures were performed using the standard protocol without intravenous  contrast. Multiplanar CT image reconstructions of the cervical spine and maxillofacial structures were also generated. COMPARISON:  05/10/2017 CT of the head, CT of the chest dated 01/19/2016 FINDINGS: CT HEAD FINDINGS Brain: Mild atrophic changes are noted. Chronic white matter ischemic changes are seen. Changes of prior bilateral parietal infarcts are again noted and stable. No findings to suggest acute hemorrhage, acute infarction or space-occupying mass lesion are noted. Vascular: No hyperdense vessel or unexpected calcification. Skull: Normal. Negative for fracture or focal lesion. Other: None. CT MAXILLOFACIAL FINDINGS Osseous: No acute fractures are identified. There are changes consistent with significant dental caries. Periapical lucency is noted about the left mandibular canine. Orbits: Orbits and their contents are within normal limits. Sinuses: Paranasal sinuses are well aerated. Soft tissues: Mild soft tissue swelling is noted about chin slightly greater to the left of the midline. No other significant soft tissue abnormality is noted. CT CERVICAL SPINE FINDINGS Alignment: Within normal limits. Skull base and vertebrae: 7 cervical segments are well visualized. Vertebral body height is well maintained. Mild osteophytic changes are noted worst at C5-6 and C6-7. Facet hypertrophic changes are noted bilaterally. Soft tissues and spinal canal: Surrounding soft tissue structures are within normal limits. Upper chest: Visualized lung apices demonstrate a small 5 mm nodule in the left apex stable in appearance from prior CT examination. Other: None IMPRESSION: CT of the head: Chronic changes without  acute abnormality. CT of the maxillofacial bones: Multiple dental caries and associated changes. No acute fracture is noted. Soft tissue swelling in the chin consistent with the recent injury. CT of the cervical spine: Degenerative change without acute abnormality. 5 mm nodule in the left apex stable from 2017  consistent with a benign etiology. Electronically Signed   By: Inez Catalina M.D.   On: 07/30/2018 14:50   Ct Maxillofacial Wo Contrast  Result Date: 07/30/2018 CLINICAL DATA:  Recent fainting episode with facial pain, initial encounter EXAM: CT HEAD WITHOUT CONTRAST CT MAXILLOFACIAL WITHOUT CONTRAST CT CERVICAL SPINE WITHOUT CONTRAST TECHNIQUE: Multidetector CT imaging of the head, cervical spine, and maxillofacial structures were performed using the standard protocol without intravenous contrast. Multiplanar CT image reconstructions of the cervical spine and maxillofacial structures were also generated. COMPARISON:  05/10/2017 CT of the head, CT of the chest dated 01/19/2016 FINDINGS: CT HEAD FINDINGS Brain: Mild atrophic changes are noted. Chronic white matter ischemic changes are seen. Changes of prior bilateral parietal infarcts are again noted and stable. No findings to suggest acute hemorrhage, acute infarction or space-occupying mass lesion are noted. Vascular: No hyperdense vessel or unexpected calcification. Skull: Normal. Negative for fracture or focal lesion. Other: None. CT MAXILLOFACIAL FINDINGS Osseous: No acute fractures are identified. There are changes consistent with significant dental caries. Periapical lucency is noted about the left mandibular canine. Orbits: Orbits and their contents are within normal limits. Sinuses: Paranasal sinuses are well aerated. Soft tissues: Mild soft tissue swelling is noted about chin slightly greater to the left of the midline. No other significant soft tissue abnormality is noted. CT CERVICAL SPINE FINDINGS Alignment: Within normal limits. Skull base and vertebrae: 7 cervical segments are well visualized. Vertebral body height is well maintained. Mild osteophytic changes are noted worst at C5-6 and C6-7. Facet hypertrophic changes are noted bilaterally. Soft tissues and spinal canal: Surrounding soft tissue structures are within normal limits. Upper chest:  Visualized lung apices demonstrate a small 5 mm nodule in the left apex stable in appearance from prior CT examination. Other: None IMPRESSION: CT of the head: Chronic changes without acute abnormality. CT of the maxillofacial bones: Multiple dental caries and associated changes. No acute fracture is noted. Soft tissue swelling in the chin consistent with the recent injury. CT of the cervical spine: Degenerative change without acute abnormality. 5 mm nodule in the left apex stable from 2017 consistent with a benign etiology. Electronically Signed   By: Inez Catalina M.D.   On: 07/30/2018 14:50    Procedures Procedures (including critical care time)  Medications Ordered in ED Medications - No data to display   Initial Impression / Assessment and Plan / ED Course  I have reviewed the triage vital signs and the nursing notes.  Pertinent labs & imaging results that were available during my care of the patient were reviewed by me and considered in my medical decision making (see chart for details).   Patient presented after a syncopal episode.  Symptoms may have been related to hypotension with her recent dialysis treatment.  CBC and basic metabolic panel are unremarkable.  Patient denied any chest pain or shortness of breath and her ED work-up is reassuring.   Guarding her injuries, CT scans are negative.  No indication of fracture or intracranial bleeding.  Patient did have a small laceration of her tongue but that stopped bleeding without any intervention and it does not require any suturing.  She feels well and is ready for discharge.  Final Clinical Impressions(s) / ED Diagnoses   Final diagnoses:  Contusion of face, initial encounter  Laceration of tongue, initial encounter  Syncope, unspecified syncope type    ED Discharge Orders    None       Dorie Rank, MD 07/30/18 1550

## 2018-07-30 NOTE — ED Notes (Signed)
Brother, Cherene Julian, would like an update as soon as possible at [phone]

## 2018-07-30 NOTE — ED Notes (Signed)
Discharge instructions discussed with Pt. Pt verbalized understanding. Pt stable and leaving via WC.    

## 2018-08-01 DIAGNOSIS — N2581 Secondary hyperparathyroidism of renal origin: Secondary | ICD-10-CM | POA: Diagnosis not present

## 2018-08-01 DIAGNOSIS — N186 End stage renal disease: Secondary | ICD-10-CM | POA: Diagnosis not present

## 2018-08-03 DIAGNOSIS — N2581 Secondary hyperparathyroidism of renal origin: Secondary | ICD-10-CM | POA: Diagnosis not present

## 2018-08-03 DIAGNOSIS — N186 End stage renal disease: Secondary | ICD-10-CM | POA: Diagnosis not present

## 2018-08-06 DIAGNOSIS — N2581 Secondary hyperparathyroidism of renal origin: Secondary | ICD-10-CM | POA: Diagnosis not present

## 2018-08-06 DIAGNOSIS — N186 End stage renal disease: Secondary | ICD-10-CM | POA: Diagnosis not present

## 2018-08-08 DIAGNOSIS — N186 End stage renal disease: Secondary | ICD-10-CM | POA: Diagnosis not present

## 2018-08-08 DIAGNOSIS — N2581 Secondary hyperparathyroidism of renal origin: Secondary | ICD-10-CM | POA: Diagnosis not present

## 2018-08-08 LAB — HEMOGLOBIN A1C: Hemoglobin A1C: 5.5

## 2018-08-10 DIAGNOSIS — N186 End stage renal disease: Secondary | ICD-10-CM | POA: Diagnosis not present

## 2018-08-10 DIAGNOSIS — N2581 Secondary hyperparathyroidism of renal origin: Secondary | ICD-10-CM | POA: Diagnosis not present

## 2018-08-12 ENCOUNTER — Ambulatory Visit (INDEPENDENT_AMBULATORY_CARE_PROVIDER_SITE_OTHER): Payer: Medicare HMO | Admitting: Nurse Practitioner

## 2018-08-12 ENCOUNTER — Telehealth: Payer: Self-pay

## 2018-08-12 ENCOUNTER — Encounter: Payer: Self-pay | Admitting: Nurse Practitioner

## 2018-08-12 ENCOUNTER — Other Ambulatory Visit: Payer: Self-pay

## 2018-08-12 DIAGNOSIS — Z794 Long term (current) use of insulin: Secondary | ICD-10-CM

## 2018-08-12 DIAGNOSIS — R51 Headache: Secondary | ICD-10-CM

## 2018-08-12 DIAGNOSIS — E785 Hyperlipidemia, unspecified: Secondary | ICD-10-CM

## 2018-08-12 DIAGNOSIS — N185 Chronic kidney disease, stage 5: Secondary | ICD-10-CM

## 2018-08-12 DIAGNOSIS — F330 Major depressive disorder, recurrent, mild: Secondary | ICD-10-CM | POA: Diagnosis not present

## 2018-08-12 DIAGNOSIS — K219 Gastro-esophageal reflux disease without esophagitis: Secondary | ICD-10-CM

## 2018-08-12 DIAGNOSIS — G4700 Insomnia, unspecified: Secondary | ICD-10-CM

## 2018-08-12 DIAGNOSIS — S0093XD Contusion of unspecified part of head, subsequent encounter: Secondary | ICD-10-CM

## 2018-08-12 DIAGNOSIS — E1149 Type 2 diabetes mellitus with other diabetic neurological complication: Secondary | ICD-10-CM

## 2018-08-12 DIAGNOSIS — R519 Headache, unspecified: Secondary | ICD-10-CM

## 2018-08-12 MED ORDER — TRAZODONE HCL 50 MG PO TABS: 50.0000 mg | ORAL_TABLET | Freq: Every day | ORAL | 3 refills | Status: DC

## 2018-08-12 NOTE — Progress Notes (Signed)
This service is provided via telemedicine  No vital signs collected/recorded due to the encounter was a telemedicine visit.   Location of patient (ex: home, work):  Home  Patient consents to a telephone visit:  Yes  Location of the provider (ex: office, home):  [facility], Office   Name of any referring provider: N/A  Names of all persons participating in the telemedicine service and their role in the encounter: S.Chrae B/CMA, [name], NP, and Patient   Time spent on call:  8 min with medical assistant   Virtual Visit via Telephone Note  I connected with [name] on 08/12/18 at  9:00 AM EDT by telephone and verified that I am speaking with the correct person using two identifiers.   I discussed the limitations, risks, security and privacy concerns of performing an evaluation and management service by telephone and the availability of in person appointments. I also discussed with the patient that there may be a patient responsible charge related to this service. The patient expressed understanding and agreed to proceed.     Careteam: Patient Care Team: [name], NP as PCP - General (Nurse Practitioner) [name], MD as Consulting Physician (Ophthalmology) [name], MD as Consulting Physician (Nephrology) [name], NP as Nurse Practitioner (Nurse Practitioner)  Advanced Directive information    Allergies  Allergen Reactions  . Lisinopril Other (See Comments)    Abnormal Kidney Function   . Pioglitazone Other (See Comments)    REACTION: Desquamation of skin of the palm  . Morphine And Related Nausea And Vomiting  . Sulfonamide Derivatives Rash    Chief Complaint  Patient presents with  . Medical Management of Chronic Issues    3 month follow-up. Fall screening, High Risk. Televisit   . Fall    Patient fell 07/30/2018 and c/o raised areas under chin and right ear pain. Scar on left side of forehead.   Marland Kitchen  Best Practice Recommendations    Per patient seen Dr.Groat 06/2018 for eye exam. I will call and request report.      HPI: Patient is a 73 y.o. female for routine follow up via tele-visit  Had a fall on 07/30/2018, passed out after her treatment.  She had been sitting in the cold room and then went outside and sitting on the bench. The next thing she remembered was in the hospital after she passed out.  Hit her head. States she has 2 knots under her chin. They have not gotten worse.  She states she was black from her chin now but clearing up.   Reports since the fall she has pain in her left ear. Placed sweet oil in there and now it is doing better.  No drainage to ear. No pain when she touches the ear.  When she swallows she feels it in the ear. She has not felt this sensation today.  No fever or chills.  Feels well today.   Insomnia- half and half- "really its okay"   CKD- currently on HD Tuesday, Thursday and Saturday.   Depression and anxiety- stable, taking paxil and trazodone. States she wants xanax back for her depression.   Hyperlipidemia- states they have checked this at HD and readings "arent bad" taking pravastatin and zetia daily.   Gerd- controlled on Protonix.  Review of Systems:  Review of Systems  Constitutional: Negative for chills, fever, malaise/fatigue and weight loss.  HENT: Negative for congestion, ear discharge, ear pain, sinus pain and  tinnitus.   Respiratory: Negative for cough, sputum production and shortness of breath.   Cardiovascular: Negative for chest pain, palpitations and leg swelling.  Gastrointestinal: Negative for abdominal pain, constipation, diarrhea and heartburn.  Genitourinary: Negative for urgency.       Continues to urinate, on HD  Musculoskeletal: Negative for joint pain and myalgias.  Skin: Negative.   Neurological: Negative for dizziness and headaches.  Endo/Heme/Allergies: Negative for environmental allergies.       Continues to  have hot flashes   Psychiatric/Behavioral: Positive for depression. Negative for memory loss and suicidal ideas. The patient is nervous/anxious. The patient does not have insomnia.     Past Medical History:  Diagnosis Date  . Anemia   . Anxiety   . Arthritis    lower back and knees   . Asthma    childhood  . Barrett's esophagus   . Chronic kidney disease   . Coagulation defect ([place])   . Depression   . Diabetes mellitus   . Dysphagia   . Eczema   . End stage renal disease ([place])   . Fluid overload, unspecified   . History of herpes zoster 02/2010   Recovered fully after period of acute herpetic neuralgia tx w/ gabapentin.   Marland Kitchen Hx MRSA infection 2009  . Hypercalcemia   . Hyperlipidemia   . Hypertension   . Moderate protein-calorie malnutrition ([place])   . Neuromuscular disorder (HCC)    chronic pain  . Personal history of colonic polyps 10/17/2010   hyperplastic   Past Surgical History:  Procedure Laterality Date  . ABDOMINAL HYSTERECTOMY     unclear when  . AV FISTULA PLACEMENT Left 02/13/2017   Procedure: ARTERIOVENOUS (AV) GORE-TEX STRETCH GRAFT INSERTION INTO LEFT ARM;  Surgeon: Elam Dutch, MD;  Location: [place];  Service: Vascular;  Laterality: Left;  . CATARACT EXTRACTION Bilateral   . thigh surg     left side due to MRSA   Social History:   reports that she has been smoking cigarettes. She has a 40.00 pack-year smoking history. She has never used smokeless tobacco. She reports current alcohol use of about 2.0 standard drinks of alcohol per week. She reports that she does not use drugs.  Family History  Problem Relation Age of Onset  . Hypertension Father   . Kidney disease Father   . Diabetes Brother   . Colon cancer Neg Hx   . CAD Neg Hx     Medications: Patient's Medications  New Prescriptions   No medications on file  Previous Medications   ASPIRIN 81 MG CHEWABLE TABLET    Chew 1 tablet (81 mg total) by mouth daily.   CALCIUM ACETATE (PHOSLO) 667  MG CAPSULE    Take by mouth 3 (three) times daily with meals.   CINACALCET (SENSIPAR) 30 MG TABLET    Take 30 mg by mouth daily.   CLOPIDOGREL (PLAVIX) 75 MG TABLET    TAKE 1 TABLET (75 MG TOTAL) BY MOUTH DAILY. DO NOT USE BLISTER PACKS   EZETIMIBE (ZETIA) 10 MG TABLET    Take 1 tablet (10 mg total) by mouth daily.   LATANOPROST (XALATAN) 0.005 % OPHTHALMIC SOLUTION    Place 1 drop into both eyes at bedtime. Reported on 08/27/2015   NAPROXEN SODIUM (ALEVE) 220 MG TABLET    Take 220 mg by mouth as needed.   PANTOPRAZOLE (PROTONIX) 40 MG TABLET    Take 1 tablet (40 mg total) by mouth daily.   PAROXETINE (PAXIL)  40 MG TABLET    TAKE 1 TABLET BY MOUTH EVERY DAY   PRAVASTATIN (PRAVACHOL) 40 MG TABLET    TAKE 1 TABLET (40 MG TOTAL) BY MOUTH AT BEDTIME.   SEVELAMER CARBONATE (RENVELA) 2.4 G PACK    2.4 g 3 (three) times daily with meals.   TRAZODONE (DESYREL) 50 MG TABLET    Take 0.5 tablets (25 mg total) by mouth at bedtime. For sleep   TRIAMCINOLONE CREAM (KENALOG) 0.1 %    Apply 1 application topically 3 (three) times daily as needed (rash).  Modified Medications   No medications on file  Discontinued Medications   No medications on file     Physical Exam: unable due to tele-visit    Labs reviewed: Basic Metabolic Panel: Recent Labs    09/20/17  03/04/18 0923  04/11/18 05/09/18 1114 07/30/18 1311  NA 142  --   --   --   --  138 138  K 4.3   < >  --    < > 4.6 3.8 3.9  CL  --   --   --   --   --  93* 93*  CO2  --   --   --   --   --  33* 30  GLUCOSE  --   --   --   --   --  102* 126*  BUN  --   --   --   --   --  12 18  CREATININE 8.8*  --   --   --   --  4.22* 6.50*  CALCIUM  --   --   --   --   --  8.5* 9.7  TSH  --   --  1.57  --   --   --   --    < > = values in this interval not displayed.   Liver Function Tests: Recent Labs    03/04/18 0923  AST 13  ALT 14  BILITOT 0.4  PROT 6.7   No results for input(s): LIPASE, AMYLASE in the last 8760 hours. No results for  input(s): AMMONIA in the last 8760 hours. CBC: Recent Labs    09/20/17  04/11/18 05/09/18 1114 07/30/18 1311  WBC 8.0  --   --  5.2 7.8  NEUTROABS  --   --   --  3.1  --   HGB 10.6*   < > 10.8* 12.2 12.9  HCT  --   --   --  39.3 39.7  MCV  --   --   --  98.0 94.5  PLT 198  --   --  231 230   < > = values in this interval not displayed.   Lipid Panel: No results for input(s): CHOL, HDL, LDLCALC, TRIG, CHOLHDL, LDLDIRECT in the last 8760 hours. TSH: Recent Labs    03/04/18 0923  TSH 1.57   A1C: Lab Results  Component Value Date   HGBA1C 5.5 01/22/2018     Assessment/Plan 1. Insomnia, unspecified type Stable at this time. Continues on trazodone. Will increase trazodone due to worsening depression - traZODone (DESYREL) 50 MG tablet; Take 1 tablet (50 mg total) by mouth at bedtime. For sleep  Dispense: 30 tablet; Refill: 3  2. CKD (chronic kidney disease) stage 5, GFR less than 15 ml/min (HCC) On HD, continue to go to HD  3. Contusion of head, unspecified part of head After syncopal episode, overall doing better. Healing without worsening of  symptoms.  4. 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 Diet controlled.   5. Mild episode of recurrent major depressive disorder (HCC) Will increase trazodone for better symptom management.   6.  Nonintractable headache, unspecified chronicity pattern, unspecified headache type Improved, not having as many headaches at this time  8. Gastroesophageal reflux disease, esophagitis presence not specified Stable on protonix.   9. Hyperlipidemia with target LDL less than 70 Request lipids to be drawn at HD, continues on pravastatin with dietary modifications.   Next appt: 3 months, sooner if needed  Windsor Goeken K. Harle Battiest  [facility] [phone]   Follow Up Instructions:    I discussed the  assessment and treatment plan with the patient. The patient was provided an opportunity to ask questions and all were answered. The patient agreed with the plan and demonstrated an understanding of the instructions.   The patient was advised to call back or seek an in-person evaluation if the symptoms worsen or if the condition fails to improve as anticipated.  I provided 16 minutes of non-face-to-face time during this encounter.   [name], NP

## 2018-08-12 NOTE — Patient Instructions (Addendum)
Increase trazodone to 50 mg (1 tablet) at bedtime for mood.

## 2018-08-12 NOTE — Telephone Encounter (Signed)
I called Dr.Groat's office and requested last diabetic eye exam. I left pertinent information for report to be faxed to our office.   Awaiting Fax

## 2018-08-13 DIAGNOSIS — N2581 Secondary hyperparathyroidism of renal origin: Secondary | ICD-10-CM | POA: Diagnosis not present

## 2018-08-13 DIAGNOSIS — N186 End stage renal disease: Secondary | ICD-10-CM | POA: Diagnosis not present

## 2018-08-14 ENCOUNTER — Encounter: Payer: Self-pay | Admitting: *Deleted

## 2018-08-15 DIAGNOSIS — N2581 Secondary hyperparathyroidism of renal origin: Secondary | ICD-10-CM | POA: Diagnosis not present

## 2018-08-15 DIAGNOSIS — N186 End stage renal disease: Secondary | ICD-10-CM | POA: Diagnosis not present

## 2018-08-15 LAB — BASIC METABOLIC PANEL: Potassium: 5.9 — AB (ref 3.4–5.3)

## 2018-08-15 LAB — CBC AND DIFFERENTIAL: Hemoglobin: 11 — AB (ref 12.0–16.0)

## 2018-08-16 LAB — LIPID PANEL
Cholesterol: 131 (ref 0–200)
HDL: 42 (ref 35–70)
LDL Cholesterol: 61
Triglycerides: 139 (ref 40–160)

## 2018-08-17 DIAGNOSIS — N186 End stage renal disease: Secondary | ICD-10-CM | POA: Diagnosis not present

## 2018-08-17 DIAGNOSIS — N2581 Secondary hyperparathyroidism of renal origin: Secondary | ICD-10-CM | POA: Diagnosis not present

## 2018-08-20 ENCOUNTER — Encounter: Payer: Self-pay | Admitting: *Deleted

## 2018-08-20 DIAGNOSIS — N186 End stage renal disease: Secondary | ICD-10-CM | POA: Diagnosis not present

## 2018-08-20 DIAGNOSIS — N2581 Secondary hyperparathyroidism of renal origin: Secondary | ICD-10-CM | POA: Diagnosis not present

## 2018-08-20 NOTE — Progress Notes (Signed)
[place] [phone]. [place]

## 2018-08-22 ENCOUNTER — Ambulatory Visit: Payer: Medicare HMO | Admitting: Internal Medicine

## 2018-08-22 DIAGNOSIS — N2581 Secondary hyperparathyroidism of renal origin: Secondary | ICD-10-CM | POA: Diagnosis not present

## 2018-08-22 DIAGNOSIS — E1129 Type 2 diabetes mellitus with other diabetic kidney complication: Secondary | ICD-10-CM | POA: Diagnosis not present

## 2018-08-22 DIAGNOSIS — Z992 Dependence on renal dialysis: Secondary | ICD-10-CM | POA: Diagnosis not present

## 2018-08-22 DIAGNOSIS — N186 End stage renal disease: Secondary | ICD-10-CM | POA: Diagnosis not present

## 2018-08-24 DIAGNOSIS — N2581 Secondary hyperparathyroidism of renal origin: Secondary | ICD-10-CM | POA: Diagnosis not present

## 2018-08-24 DIAGNOSIS — N186 End stage renal disease: Secondary | ICD-10-CM | POA: Diagnosis not present

## 2018-08-25 ENCOUNTER — Other Ambulatory Visit: Payer: Self-pay | Admitting: Nurse Practitioner

## 2018-08-25 DIAGNOSIS — I639 Cerebral infarction, unspecified: Secondary | ICD-10-CM

## 2018-08-26 ENCOUNTER — Other Ambulatory Visit: Payer: Self-pay | Admitting: Nurse Practitioner

## 2018-08-26 DIAGNOSIS — F418 Other specified anxiety disorders: Secondary | ICD-10-CM

## 2018-08-26 NOTE — Telephone Encounter (Signed)
High risk warning populated when attempting to refill medication. RX sent to [name], NP

## 2018-08-27 DIAGNOSIS — N2581 Secondary hyperparathyroidism of renal origin: Secondary | ICD-10-CM | POA: Diagnosis not present

## 2018-08-27 DIAGNOSIS — N186 End stage renal disease: Secondary | ICD-10-CM | POA: Diagnosis not present

## 2018-08-29 DIAGNOSIS — N2581 Secondary hyperparathyroidism of renal origin: Secondary | ICD-10-CM | POA: Diagnosis not present

## 2018-08-29 DIAGNOSIS — N186 End stage renal disease: Secondary | ICD-10-CM | POA: Diagnosis not present

## 2018-08-31 DIAGNOSIS — N2581 Secondary hyperparathyroidism of renal origin: Secondary | ICD-10-CM | POA: Diagnosis not present

## 2018-08-31 DIAGNOSIS — N186 End stage renal disease: Secondary | ICD-10-CM | POA: Diagnosis not present

## 2018-09-02 ENCOUNTER — Telehealth: Payer: Self-pay | Admitting: *Deleted

## 2018-09-02 NOTE — Telephone Encounter (Signed)
This was discontinued because it causes excessive sedation for her. She can make a tele-visit to discuss medication if needed.

## 2018-09-02 NOTE — Telephone Encounter (Signed)
appt scheduled tomorrow 09/03/2018

## 2018-09-02 NOTE — Telephone Encounter (Signed)
Pt calling asking for refill of Xanax, pt states she's extra stressed and needs this even though Janett Billow has stopped it.

## 2018-09-03 ENCOUNTER — Ambulatory Visit (INDEPENDENT_AMBULATORY_CARE_PROVIDER_SITE_OTHER): Payer: Medicare HMO | Admitting: Nurse Practitioner

## 2018-09-03 ENCOUNTER — Encounter: Payer: Self-pay | Admitting: Nurse Practitioner

## 2018-09-03 ENCOUNTER — Other Ambulatory Visit: Payer: Self-pay

## 2018-09-03 DIAGNOSIS — F419 Anxiety disorder, unspecified: Secondary | ICD-10-CM | POA: Diagnosis not present

## 2018-09-03 DIAGNOSIS — F329 Major depressive disorder, single episode, unspecified: Secondary | ICD-10-CM

## 2018-09-03 DIAGNOSIS — N186 End stage renal disease: Secondary | ICD-10-CM | POA: Diagnosis not present

## 2018-09-03 DIAGNOSIS — N2581 Secondary hyperparathyroidism of renal origin: Secondary | ICD-10-CM | POA: Diagnosis not present

## 2018-09-03 DIAGNOSIS — G4700 Insomnia, unspecified: Secondary | ICD-10-CM

## 2018-09-03 MED ORDER — TRAZODONE HCL 100 MG PO TABS: 100.0000 mg | ORAL_TABLET | Freq: Every day | ORAL | 1 refills | Status: DC

## 2018-09-03 MED ORDER — ALPRAZOLAM 0.25 MG PO TABS: 0.2500 mg | ORAL_TABLET | Freq: Every day | ORAL | 0 refills | Status: DC | PRN

## 2018-09-03 NOTE — Patient Instructions (Signed)
To increase trazodone to 100 mg by mouth daily to help with mood To use xanax 0.25 daily as needed for anxiety attack.  If anxiety has not improved or worsened to call back Recommend that you call psychologist for ongoing help.  Generalized Anxiety Disorder, Adult Generalized anxiety disorder (GAD) is a mental health disorder. People with this condition constantly worry about everyday events. Unlike normal anxiety, worry related to GAD is not triggered by a specific event. These worries also do not fade or get better with time. GAD interferes with life functions, including relationships, work, and school. GAD can vary from mild to severe. People with severe GAD can have intense waves of anxiety with physical symptoms (panic attacks). What are the causes? The exact cause of GAD is not known. What increases the risk? This condition is more likely to develop in:  Women.  People who have a family history of anxiety disorders.  People who are very shy.  People who experience very stressful life events, such as the death of a loved one.  People who have a very stressful family environment. What are the signs or symptoms? People with GAD often worry excessively about many things in their lives, such as their health and family. They may also be overly concerned about:  Doing well at work.  Being on time.  Natural disasters.  Friendships. Physical symptoms of GAD include:  Fatigue.  Muscle tension or having muscle twitches.  Trembling or feeling shaky.  Being easily startled.  Feeling like your heart is pounding or racing.  Feeling out of breath or like you cannot take a deep breath.  Having trouble falling asleep or staying asleep.  Sweating.  Nausea, diarrhea, or irritable bowel syndrome (IBS).  Headaches.  Trouble concentrating or remembering facts.  Restlessness.  Irritability. How is this diagnosed? Your health care provider can diagnose GAD based on your  symptoms and medical history. You will also have a physical exam. The health care provider will ask specific questions about your symptoms, including how severe they are, when they started, and if they come and go. Your health care provider may ask you about your use of alcohol or drugs, including prescription medicines. Your health care provider may refer you to a mental health specialist for further evaluation. Your health care provider will do a thorough examination and may perform additional tests to rule out other possible causes of your symptoms. To be diagnosed with GAD, a person must have anxiety that:  Is out of his or her control.  Affects several different aspects of his or her life, such as work and relationships.  Causes distress that makes him or her unable to take part in normal activities.  Includes at least three physical symptoms of GAD, such as restlessness, fatigue, trouble concentrating, irritability, muscle tension, or sleep problems. Before your health care provider can confirm a diagnosis of GAD, these symptoms must be present more days than they are not, and they must last for six months or longer. How is this treated? The following therapies are usually used to treat GAD:  Medicine. Antidepressant medicine is usually prescribed for long-term daily control. Antianxiety medicines may be added in severe cases, especially when panic attacks occur.  Talk therapy (psychotherapy). Certain types of talk therapy can be helpful in treating GAD by providing support, education, and guidance. Options include: ? Cognitive behavioral therapy (CBT). People learn coping skills and techniques to ease their anxiety. They learn to identify unrealistic or negative thoughts and  behaviors and to replace them with positive ones. ? Acceptance and commitment therapy (ACT). This treatment teaches people how to be mindful as a way to cope with unwanted thoughts and feelings. ? Biofeedback. This  process trains you to manage your body's response (physiological response) through breathing techniques and relaxation methods. You will work with a therapist while machines are used to monitor your physical symptoms.  Stress management techniques. These include yoga, meditation, and exercise. A mental health specialist can help determine which treatment is best for you. Some people see improvement with one type of therapy. However, other people require a combination of therapies. Follow these instructions at home:  Take over-the-counter and prescription medicines only as told by your health care provider.  Try to maintain a normal routine.  Try to anticipate stressful situations and allow extra time to manage them.  Practice any stress management or self-calming techniques as taught by your health care provider.  Do not punish yourself for setbacks or for not making progress.  Try to recognize your accomplishments, even if they are small.  Keep all follow-up visits as told by your health care provider. This is important. Contact a health care provider if:  Your symptoms do not get better.  Your symptoms get worse.  You have signs of depression, such as: ? A persistently sad, cranky, or irritable mood. ? Loss of enjoyment in activities that used to bring you joy. ? Change in weight or eating. ? Changes in sleeping habits. ? Avoiding friends or family members. ? Loss of energy for normal tasks. ? Feelings of guilt or worthlessness. Get help right away if:  You have serious thoughts about hurting yourself or others. If you ever feel like you may hurt yourself or others, or have thoughts about taking your own life, get help right away. You can go to your nearest emergency department or call:  Your local emergency services (911 in the [place]  A suicide crisis helpline, such as the [facility] at 980-699-9145. This is open 24 hours a day. Summary   Generalized anxiety disorder (GAD) is a mental health disorder that involves worry that is not triggered by a specific event.  People with GAD often worry excessively about many things in their lives, such as their health and family.  GAD may cause physical symptoms such as restlessness, trouble concentrating, sleep problems, frequent sweating, nausea, diarrhea, headaches, and trembling or muscle twitching.  A mental health specialist can help determine which treatment is best for you. Some people see improvement with one type of therapy. However, other people require a combination of therapies. This information is not intended to replace advice given to you by your health care provider. Make sure you discuss any questions you have with your health care provider. Document Released: 08/05/2012 Document Revised: 02/29/2016 Document Reviewed: 02/29/2016 Elsevier Interactive Patient Education  2019 [employer].

## 2018-09-03 NOTE — Progress Notes (Signed)
This service is provided via telemedicine  No vital signs collected/recorded due to the encounter was a telemedicine visit.   Location of patient (ex: home, work):  Home  Patient consents to a telephone visit: Yes  Location of the provider (ex: office, home):  [facility], Office   Name of any referring provider:  N/A  Names of all persons participating in the telemedicine service and their role in the encounter: S.Chrae B/CMA, [name], NP, and Patient   Time spent on call:  3 min with medical assistant   Virtual Visit via Telephone Note  I connected with [name] on 09/03/18 at 12:00 PM EDT by telephone and verified that I am speaking with the correct person using two identifiers.  Location: Patient: home Provider: office    I discussed the limitations, risks, security and privacy concerns of performing an evaluation and management service by telephone and the availability of in person appointments. I also discussed with the patient that there may be a patient responsible charge related to this service. The patient expressed understanding and agreed to proceed.     Careteam: Patient Care Team: [name], NP as PCP - General (Nurse Practitioner) [name], MD as Consulting Physician (Ophthalmology) [name], MD as Consulting Physician (Nephrology) [name], NP as Nurse Practitioner (Nurse Practitioner)  Advanced Directive information    Allergies  Allergen Reactions  . Lisinopril Other (See Comments)    Abnormal Kidney Function   . Pioglitazone Other (See Comments)    REACTION: Desquamation of skin of the palm  . Morphine And Related Nausea And Vomiting  . Sulfonamide Derivatives Rash    Chief Complaint  Patient presents with  . Acute Visit    Discuss anxiety. Telephone visit      HPI: Patient is a 73 y.o. female seen today due to increase in anxiety.  Reports she is living with her brother and  "he is getting on her nerves" He is always doing little things that she can not tolerate.  Requesting her xanax- which she would use occasionally in the past.  Continues to take paxil 40 mg by mouth daily Has increased trazodone to 50 mg by mouth at bedtime. Anxiety continues to worsen. Denies HI or SI.  Reports she wants to cry. Will have episodes daily.  Crying over the phone stating "I need something to help me calm down"  Has a girl friend she talks to who always tells her "you can get over this" but she does not feel like she can.  Has not taken anything other than paxil for her anxiety.  Used to talk to a therapist but has not recently.   Review of Systems:  Review of Systems  Constitutional: Negative for chills, fever and malaise/fatigue.  Neurological: Positive for headaches.  Psychiatric/Behavioral: Positive for depression. Negative for memory loss and suicidal ideas. The patient is nervous/anxious. The patient does not have insomnia.     Past Medical History:  Diagnosis Date  . Anemia   . Anxiety   . Arthritis    lower back and knees   . Asthma    childhood  . Barrett's esophagus   . Chronic kidney disease   . Coagulation defect ([place])   . Depression   . Diabetes mellitus   . Dysphagia   . Eczema   . End stage renal disease ([place])   . Fluid overload, unspecified   . History of herpes zoster 02/2010   Recovered  fully after period of acute herpetic neuralgia tx w/ gabapentin.   Marland Kitchen Hx MRSA infection 2009  . Hypercalcemia   . Hyperlipidemia   . Hypertension   . Moderate protein-calorie malnutrition ([place])   . Neuromuscular disorder (HCC)    chronic pain  . Personal history of colonic polyps 10/17/2010   hyperplastic   Past Surgical History:  Procedure Laterality Date  . ABDOMINAL HYSTERECTOMY     unclear when  . AV FISTULA PLACEMENT Left 02/13/2017   Procedure: ARTERIOVENOUS (AV) GORE-TEX STRETCH GRAFT INSERTION INTO LEFT ARM;  Surgeon: Elam Dutch, MD;   Location: [place];  Service: Vascular;  Laterality: Left;  . CATARACT EXTRACTION Bilateral   . thigh surg     left side due to MRSA   Social History:   reports that she has been smoking cigarettes. She has a 40.00 pack-year smoking history. She has never used smokeless tobacco. She reports current alcohol use of about 2.0 standard drinks of alcohol per week. She reports that she does not use drugs.  Family History  Problem Relation Age of Onset  . Hypertension Father   . Kidney disease Father   . Diabetes Brother   . Colon cancer Neg Hx   . CAD Neg Hx     Medications: Patient's Medications  New Prescriptions   No medications on file  Previous Medications   ASPIRIN 81 MG CHEWABLE TABLET    Chew 1 tablet (81 mg total) by mouth daily.   CALCIUM ACETATE (PHOSLO) 667 MG CAPSULE    Take by mouth 3 (three) times daily with meals.   CINACALCET (SENSIPAR) 30 MG TABLET    Take 30 mg by mouth daily.   CLOPIDOGREL (PLAVIX) 75 MG TABLET    TAKE 1 TABLET (75 MG TOTAL) BY MOUTH DAILY. DO NOT USE BLISTER PACKS   EZETIMIBE (ZETIA) 10 MG TABLET    TAKE 1 TABLET BY MOUTH EVERY DAY   LATANOPROST (XALATAN) 0.005 % OPHTHALMIC SOLUTION    Place 1 drop into both eyes at bedtime. Reported on 08/27/2015   PANTOPRAZOLE (PROTONIX) 40 MG TABLET    Take 1 tablet (40 mg total) by mouth daily.   PAROXETINE (PAXIL) 40 MG TABLET    TAKE 1 TABLET BY MOUTH EVERY DAY   PRAVASTATIN (PRAVACHOL) 40 MG TABLET    TAKE 1 TABLET (40 MG TOTAL) BY MOUTH AT BEDTIME.   SEVELAMER CARBONATE (RENVELA) 2.4 G PACK    2.4 g 3 (three) times daily with meals.   TRAZODONE (DESYREL) 50 MG TABLET    Take 1 tablet (50 mg total) by mouth at bedtime. For sleep   TRIAMCINOLONE CREAM (KENALOG) 0.1 %    Apply 1 application topically 3 (three) times daily as needed (rash).  Modified Medications   No medications on file  Discontinued Medications   No medications on file     Physical Exam: unable due to televisit   Labs reviewed: Basic  Metabolic Panel: Recent Labs    09/20/17  03/04/18 0923  05/09/18 1114 07/30/18 1311 08/15/18  NA 142  --   --   --  138 138  --   K 4.3   < >  --    < > 3.8 3.9 5.9*  CL  --   --   --   --  93* 93*  --   CO2  --   --   --   --  33* 30  --   GLUCOSE  --   --   --   --  102* 126*  --   BUN  --   --   --   --  12 18  --   CREATININE 8.8*  --   --   --  4.22* 6.50*  --   CALCIUM  --   --   --   --  8.5* 9.7  --   TSH  --   --  1.57  --   --   --   --    < > = values in this interval not displayed.   Liver Function Tests: Recent Labs    03/04/18 0923  AST 13  ALT 14  BILITOT 0.4  PROT 6.7   No results for input(s): LIPASE, AMYLASE in the last 8760 hours. No results for input(s): AMMONIA in the last 8760 hours. CBC: Recent Labs    09/20/17  05/09/18 1114 07/30/18 1311 08/15/18  WBC 8.0  --  5.2 7.8  --   NEUTROABS  --   --  3.1  --   --   HGB 10.6*   < > 12.2 12.9 11.0*  HCT  --   --  39.3 39.7  --   MCV  --   --  98.0 94.5  --   PLT 198  --  231 230  --    < > = values in this interval not displayed.   Lipid Panel: Recent Labs    08/15/18  CHOL 131  HDL 42  LDLCALC 61  TRIG 139   TSH: Recent Labs    03/04/18 0923  TSH 1.57   A1C: Lab Results  Component Value Date   HGBA1C 5.5 08/08/2018     Assessment/Plan 1. Anxiety and depression -worsening anxiety and depression.  -continues on paxil 40 mg daily -discussed evaluation with psychologist who she has seen in the past.  -she has talked to her friends who have not helped her with coping.  -lifestyle modifications encouraged such as increase in physical activity and healthy eating.  -will increase trazodone to 100 mg by mouth at bedtime to help with mood.  -to use xanax as needed for severe anxiety/panic. - traZODone (DESYREL) 100 MG tablet; Take 1 tablet (100 mg total) by mouth at bedtime. For sleep  Dispense: 30 tablet; Refill: 1 - ALPRAZolam (XANAX) 0.25 MG tablet; Take 1 tablet (0.25 mg total) by  mouth daily as needed for anxiety.  Dispense: 15 tablet; Refill: 0 -if anxiety or depression worsens to notify. If having any SI or HI to go to ED immediatly for evaluation  2. Insomnia, unspecified type - traZODone (DESYREL) 100 MG tablet; Take 1 tablet (100 mg total) by mouth at bedtime. For sleep  Dispense: 30 tablet; Refill: 1  Next appt: 2 weeks for follow up.  Carlos American. Harle Battiest  [facility] [phone]   Follow Up Instructions:    I discussed the assessment and treatment plan with the patient. The patient was provided an opportunity to ask questions and all were answered. The patient agreed with the plan and demonstrated an understanding of the instructions.   The patient was advised to call back or seek an in-person evaluation if the symptoms worsen or if the condition fails to improve as anticipated.  I provided 16 minutes of non-face-to-face time during this encounter.   [name], NP  avs printed and mailed.

## 2018-09-05 DIAGNOSIS — N186 End stage renal disease: Secondary | ICD-10-CM | POA: Diagnosis not present

## 2018-09-05 DIAGNOSIS — N2581 Secondary hyperparathyroidism of renal origin: Secondary | ICD-10-CM | POA: Diagnosis not present

## 2018-09-07 DIAGNOSIS — N2581 Secondary hyperparathyroidism of renal origin: Secondary | ICD-10-CM | POA: Diagnosis not present

## 2018-09-07 DIAGNOSIS — N186 End stage renal disease: Secondary | ICD-10-CM | POA: Diagnosis not present

## 2018-09-10 DIAGNOSIS — N186 End stage renal disease: Secondary | ICD-10-CM | POA: Diagnosis not present

## 2018-09-10 DIAGNOSIS — N2581 Secondary hyperparathyroidism of renal origin: Secondary | ICD-10-CM | POA: Diagnosis not present

## 2018-09-12 DIAGNOSIS — N2581 Secondary hyperparathyroidism of renal origin: Secondary | ICD-10-CM | POA: Diagnosis not present

## 2018-09-12 DIAGNOSIS — N186 End stage renal disease: Secondary | ICD-10-CM | POA: Diagnosis not present

## 2018-09-14 DIAGNOSIS — N2581 Secondary hyperparathyroidism of renal origin: Secondary | ICD-10-CM | POA: Diagnosis not present

## 2018-09-14 DIAGNOSIS — N186 End stage renal disease: Secondary | ICD-10-CM | POA: Diagnosis not present

## 2018-09-17 ENCOUNTER — Ambulatory Visit: Payer: Medicare HMO | Admitting: Nurse Practitioner

## 2018-09-17 DIAGNOSIS — N186 End stage renal disease: Secondary | ICD-10-CM | POA: Diagnosis not present

## 2018-09-17 DIAGNOSIS — N2581 Secondary hyperparathyroidism of renal origin: Secondary | ICD-10-CM | POA: Diagnosis not present

## 2018-09-18 ENCOUNTER — Encounter: Payer: Self-pay | Admitting: *Deleted

## 2018-09-18 ENCOUNTER — Ambulatory Visit: Payer: Medicare HMO | Admitting: Nurse Practitioner

## 2018-09-18 DIAGNOSIS — Z1231 Encounter for screening mammogram for malignant neoplasm of breast: Secondary | ICD-10-CM | POA: Diagnosis not present

## 2018-09-18 LAB — HM MAMMOGRAPHY

## 2018-09-19 DIAGNOSIS — N186 End stage renal disease: Secondary | ICD-10-CM | POA: Diagnosis not present

## 2018-09-19 DIAGNOSIS — N2581 Secondary hyperparathyroidism of renal origin: Secondary | ICD-10-CM | POA: Diagnosis not present

## 2018-09-20 ENCOUNTER — Other Ambulatory Visit: Payer: Self-pay

## 2018-09-20 ENCOUNTER — Encounter: Payer: Self-pay | Admitting: Nurse Practitioner

## 2018-09-20 ENCOUNTER — Ambulatory Visit (INDEPENDENT_AMBULATORY_CARE_PROVIDER_SITE_OTHER): Payer: Medicare HMO | Admitting: Nurse Practitioner

## 2018-09-20 VITALS — BP 124/70 | HR 83 | Temp 98.3°F | Ht 65.0 in | Wt 186.0 lb

## 2018-09-20 DIAGNOSIS — F329 Major depressive disorder, single episode, unspecified: Secondary | ICD-10-CM | POA: Diagnosis not present

## 2018-09-20 DIAGNOSIS — G4700 Insomnia, unspecified: Secondary | ICD-10-CM

## 2018-09-20 DIAGNOSIS — F419 Anxiety disorder, unspecified: Secondary | ICD-10-CM

## 2018-09-20 DIAGNOSIS — F32A Depression, unspecified: Secondary | ICD-10-CM

## 2018-09-20 MED ORDER — ALPRAZOLAM 0.25 MG PO TABS: 0.2500 mg | ORAL_TABLET | Freq: Every day | ORAL | 0 refills | Status: DC | PRN

## 2018-09-20 MED ORDER — SERTRALINE HCL 50 MG PO TABS: 50.0000 mg | ORAL_TABLET | Freq: Every day | ORAL | 0 refills | Status: DC

## 2018-09-20 NOTE — Patient Instructions (Addendum)
To stop paxil Start ZOLOFT 50 mg by mouth daily To use xanax DAILY as needed for severe anxiety- this supply will be for the month  Use relaxation techniques Deep breathing when you are feeling anxiety or anger To increase your phyical activity- to do 15 mins 4 days a week to start To sit outside daily for 15 mins by yourself and reflect and relax   Encourage you to make appt with psychiatrist and counselor for better management   Follow up in 2 weeks for TELE-VISIT to follow up on anxiety and depression.

## 2018-09-20 NOTE — Progress Notes (Signed)
Careteam: Patient Care Team: [name], NP as PCP - General (Nurse Practitioner) [name], MD as Consulting Physician (Ophthalmology) [name], MD as Consulting Physician (Nephrology) [name], NP as Nurse Practitioner (Nurse Practitioner)  Advanced Directive information    Allergies  Allergen Reactions  . Lisinopril Other (See Comments)    Abnormal Kidney Function   . Pioglitazone Other (See Comments)    REACTION: Desquamation of skin of the palm  . Morphine And Related Nausea And Vomiting  . Sulfonamide Derivatives Rash    Chief Complaint  Patient presents with  . Follow-up    2 week follow-up on anxiety and depression. Patient states nerve pill needs to be stronger   . Quality Metric Gaps    Discuss need for eye exam      HPI: Patient is a 73 y.o. female seen in the office today Increased trazodone to 100 mg by mouth at bedtime. Reports this has helped some but still having a lot of anxiety and depression.  Reports sometimes she feels like she wants to hurt people.  She was given alprazolam to use daily as needed but was using three times a day.  Reports she was calm when she took medication. States that she was not lethargic or drowsy with medication.  States he has been many years since she saw a psychiatrist. Reports she can not afford to see psychiatrist or counseling.   Reports sleep is unchanged. Reports melatonin is "no good"   Review of Systems:  Review of Systems  Constitutional: Negative for chills, fever and malaise/fatigue.  Respiratory: Negative for shortness of breath.   Cardiovascular: Negative for chest pain and palpitations.  Psychiatric/Behavioral: Positive for depression. Negative for substance abuse and suicidal ideas. The patient is nervous/anxious and has insomnia.     Past Medical History:  Diagnosis Date  . Anemia   . Anxiety   . Arthritis    lower back and knees   . Asthma    childhood  .  Barrett's esophagus   . Chronic kidney disease   . Coagulation defect ([place])   . Depression   . Diabetes mellitus   . Dysphagia   . Eczema   . End stage renal disease ([place])   . Fluid overload, unspecified   . History of herpes zoster 02/2010   Recovered fully after period of acute herpetic neuralgia tx w/ gabapentin.   Marland Kitchen Hx MRSA infection 2009  . Hypercalcemia   . Hyperlipidemia   . Hypertension   . Moderate protein-calorie malnutrition ([place])   . Neuromuscular disorder (HCC)    chronic pain  . Personal history of colonic polyps 10/17/2010   hyperplastic   Past Surgical History:  Procedure Laterality Date  . ABDOMINAL HYSTERECTOMY     unclear when  . AV FISTULA PLACEMENT Left 02/13/2017   Procedure: ARTERIOVENOUS (AV) GORE-TEX STRETCH GRAFT INSERTION INTO LEFT ARM;  Surgeon: Elam Dutch, MD;  Location: [place];  Service: Vascular;  Laterality: Left;  . CATARACT EXTRACTION Bilateral   . thigh surg     left side due to MRSA   Social History:   reports that she has been smoking cigarettes. She has a 40.00 pack-year smoking history. She has never used smokeless tobacco. She reports current alcohol use of about 2.0 standard drinks of alcohol per week. She reports that she does not use drugs.  Family History  Problem Relation Age of Onset  . Hypertension Father   . Kidney disease Father   .  Diabetes Brother   . Colon cancer Neg Hx   . CAD Neg Hx     Medications: Patient's Medications  New Prescriptions   No medications on file  Previous Medications   ASPIRIN 81 MG CHEWABLE TABLET    Chew 1 tablet (81 mg total) by mouth daily.   CALCIUM ACETATE (PHOSLO) 667 MG CAPSULE    Take by mouth 3 (three) times daily with meals.   CINACALCET (SENSIPAR) 30 MG TABLET    Take 30 mg by mouth daily.   CLOPIDOGREL (PLAVIX) 75 MG TABLET    TAKE 1 TABLET (75 MG TOTAL) BY MOUTH DAILY. DO NOT USE BLISTER PACKS   EZETIMIBE (ZETIA) 10 MG TABLET    TAKE 1 TABLET BY MOUTH EVERY DAY    LATANOPROST (XALATAN) 0.005 % OPHTHALMIC SOLUTION    Place 1 drop into both eyes at bedtime. Reported on 08/27/2015   PANTOPRAZOLE (PROTONIX) 40 MG TABLET    Take 1 tablet (40 mg total) by mouth daily.   PAROXETINE (PAXIL) 40 MG TABLET    TAKE 1 TABLET BY MOUTH EVERY DAY   PRAVASTATIN (PRAVACHOL) 40 MG TABLET    TAKE 1 TABLET (40 MG TOTAL) BY MOUTH AT BEDTIME.   SEVELAMER CARBONATE (RENVELA) 2.4 G PACK    2.4 g 3 (three) times daily with meals.   TRAZODONE (DESYREL) 100 MG TABLET    Take 1 tablet (100 mg total) by mouth at bedtime. For sleep   TRIAMCINOLONE CREAM (KENALOG) 0.1 %    Apply 1 application topically 3 (three) times daily as needed (rash).  Modified Medications   No medications on file  Discontinued Medications   ALPRAZOLAM (XANAX) 0.25 MG TABLET    Take 1 tablet (0.25 mg total) by mouth daily as needed for anxiety.     Physical Exam:  Vitals:   09/20/18 0900  BP: 124/70  Pulse: 83  Temp: 98.3 F (36.8 C)  TempSrc: Oral  SpO2: 96%  Weight: 186 lb (84.4 kg)  Height: 5\' 5"  (1.651 m)   Body mass index is 30.95 kg/m.  Wt Readings from Last 3 Encounters:  09/20/18 186 lb (84.4 kg)  07/30/18 165 lb (74.8 kg)  05/17/18 185 lb (83.9 kg)     Physical Exam Constitutional:      General: She is not in acute distress.    Appearance: She is well-developed.  HENT:     Head: Normocephalic and atraumatic.  Eyes:     Conjunctiva/sclera: Conjunctivae normal.     Pupils: Pupils are equal, round, and reactive to light.  Cardiovascular:     Rate and Rhythm: Normal rate and regular rhythm.     Heart sounds: Normal heart sounds. No murmur.  Pulmonary:     Effort: Pulmonary effort is normal. No respiratory distress.     Breath sounds: Normal breath sounds. No wheezing.  Skin:    General: Skin is warm and dry.  Neurological:     Mental Status: She is alert and oriented to person, place, and time.  Psychiatric:        Attention and Perception: She is inattentive.        Mood and  Affect: Mood is depressed. Affect is blunt and flat.        Behavior: Behavior normal.        Thought Content: Thought content normal.        Cognition and Memory: Cognition normal.        Judgment: Judgment normal.  Labs reviewed: Basic Metabolic Panel: Recent Labs    03/04/18 0923  05/09/18 1114 07/30/18 1311 08/15/18  NA  --   --  138 138  --   K  --    < > 3.8 3.9 5.9*  CL  --   --  93* 93*  --   CO2  --   --  33* 30  --   GLUCOSE  --   --  102* 126*  --   BUN  --   --  12 18  --   CREATININE  --   --  4.22* 6.50*  --   CALCIUM  --   --  8.5* 9.7  --   TSH 1.57  --   --   --   --    < > = values in this interval not displayed.   Liver Function Tests: Recent Labs    03/04/18 0923  AST 13  ALT 14  BILITOT 0.4  PROT 6.7   No results for input(s): LIPASE, AMYLASE in the last 8760 hours. No results for input(s): AMMONIA in the last 8760 hours. CBC: Recent Labs    05/09/18 1114 07/30/18 1311 08/15/18  WBC 5.2 7.8  --   NEUTROABS 3.1  --   --   HGB 12.2 12.9 11.0*  HCT 39.3 39.7  --   MCV 98.0 94.5  --   PLT 231 230  --    Lipid Panel: Recent Labs    08/15/18  CHOL 131  HDL 42  LDLCALC 61  TRIG 139   TSH: Recent Labs    03/04/18 0923  TSH 1.57   A1C: Lab Results  Component Value Date   HGBA1C 5.5 08/08/2018     Assessment/Plan 1. Anxiety and depression -To stop paxil -Start ZOLOFT 50 mg by mouth daily -To use xanax DAILY as needed for severe anxiety- this supply will be for the month -no SI or HI, discussed seeking immediate help if these thoughts occur.  discussed relaxation techniques To increase your phyical activity- to do 15 mins 4 days a week to start To sit outside daily for 15 mins by yourself and reflect and relax  Encourage you to make appt with psychiatrist and counselor for better management  - sertraline (ZOLOFT) 50 MG tablet; Take 1 tablet (50 mg total) by mouth daily.  Dispense: 30 tablet; Refill: 0 - ALPRAZolam (XANAX)  0.25 MG tablet; Take 1 tablet (0.25 mg total) by mouth daily as needed for anxiety.  Dispense: 15 tablet; Refill: 0  2. Insomnia, unspecified type -ongoing, continue current trazodone to help with sleep and depression management as well as nonpharmacologic methods  Next appt: 2 weeks.  Carlos American. Harle Battiest  [facility] [phone]   Total time 25:  time greater than 50% of total time spent doing pt counseling and coordination of care due to anxiety and depression

## 2018-09-21 DIAGNOSIS — N2581 Secondary hyperparathyroidism of renal origin: Secondary | ICD-10-CM | POA: Diagnosis not present

## 2018-09-21 DIAGNOSIS — N186 End stage renal disease: Secondary | ICD-10-CM | POA: Diagnosis not present

## 2018-09-22 DIAGNOSIS — Z992 Dependence on renal dialysis: Secondary | ICD-10-CM | POA: Diagnosis not present

## 2018-09-22 DIAGNOSIS — E1129 Type 2 diabetes mellitus with other diabetic kidney complication: Secondary | ICD-10-CM | POA: Diagnosis not present

## 2018-09-22 DIAGNOSIS — N186 End stage renal disease: Secondary | ICD-10-CM | POA: Diagnosis not present

## 2018-09-24 DIAGNOSIS — N2581 Secondary hyperparathyroidism of renal origin: Secondary | ICD-10-CM | POA: Diagnosis not present

## 2018-09-24 DIAGNOSIS — N186 End stage renal disease: Secondary | ICD-10-CM | POA: Diagnosis not present

## 2018-09-26 DIAGNOSIS — N2581 Secondary hyperparathyroidism of renal origin: Secondary | ICD-10-CM | POA: Diagnosis not present

## 2018-09-26 DIAGNOSIS — N186 End stage renal disease: Secondary | ICD-10-CM | POA: Diagnosis not present

## 2018-09-27 ENCOUNTER — Other Ambulatory Visit: Payer: Self-pay | Admitting: Nurse Practitioner

## 2018-09-27 DIAGNOSIS — G4700 Insomnia, unspecified: Secondary | ICD-10-CM

## 2018-09-27 DIAGNOSIS — F329 Major depressive disorder, single episode, unspecified: Secondary | ICD-10-CM

## 2018-09-27 DIAGNOSIS — F32A Depression, unspecified: Secondary | ICD-10-CM

## 2018-09-27 NOTE — Telephone Encounter (Signed)
Last refill was only for # 30 with 1 refill. Will you allow a 90 day supply

## 2018-09-28 ENCOUNTER — Other Ambulatory Visit: Payer: Self-pay | Admitting: Nurse Practitioner

## 2018-09-28 DIAGNOSIS — N2581 Secondary hyperparathyroidism of renal origin: Secondary | ICD-10-CM | POA: Diagnosis not present

## 2018-09-28 DIAGNOSIS — I639 Cerebral infarction, unspecified: Secondary | ICD-10-CM

## 2018-09-28 DIAGNOSIS — N186 End stage renal disease: Secondary | ICD-10-CM | POA: Diagnosis not present

## 2018-10-01 DIAGNOSIS — N2581 Secondary hyperparathyroidism of renal origin: Secondary | ICD-10-CM | POA: Diagnosis not present

## 2018-10-01 DIAGNOSIS — N186 End stage renal disease: Secondary | ICD-10-CM | POA: Diagnosis not present

## 2018-10-03 DIAGNOSIS — N2581 Secondary hyperparathyroidism of renal origin: Secondary | ICD-10-CM | POA: Diagnosis not present

## 2018-10-03 DIAGNOSIS — N186 End stage renal disease: Secondary | ICD-10-CM | POA: Diagnosis not present

## 2018-10-05 DIAGNOSIS — N2581 Secondary hyperparathyroidism of renal origin: Secondary | ICD-10-CM | POA: Diagnosis not present

## 2018-10-05 DIAGNOSIS — N186 End stage renal disease: Secondary | ICD-10-CM | POA: Diagnosis not present

## 2018-10-08 DIAGNOSIS — N186 End stage renal disease: Secondary | ICD-10-CM | POA: Diagnosis not present

## 2018-10-08 DIAGNOSIS — N2581 Secondary hyperparathyroidism of renal origin: Secondary | ICD-10-CM | POA: Diagnosis not present

## 2018-10-10 ENCOUNTER — Other Ambulatory Visit: Payer: Self-pay | Admitting: Nurse Practitioner

## 2018-10-10 DIAGNOSIS — K219 Gastro-esophageal reflux disease without esophagitis: Secondary | ICD-10-CM

## 2018-10-10 DIAGNOSIS — N186 End stage renal disease: Secondary | ICD-10-CM | POA: Diagnosis not present

## 2018-10-10 DIAGNOSIS — N2581 Secondary hyperparathyroidism of renal origin: Secondary | ICD-10-CM | POA: Diagnosis not present

## 2018-10-12 DIAGNOSIS — N186 End stage renal disease: Secondary | ICD-10-CM | POA: Diagnosis not present

## 2018-10-12 DIAGNOSIS — N2581 Secondary hyperparathyroidism of renal origin: Secondary | ICD-10-CM | POA: Diagnosis not present

## 2018-10-13 ENCOUNTER — Other Ambulatory Visit: Payer: Self-pay | Admitting: Nurse Practitioner

## 2018-10-13 DIAGNOSIS — F329 Major depressive disorder, single episode, unspecified: Secondary | ICD-10-CM

## 2018-10-13 DIAGNOSIS — F419 Anxiety disorder, unspecified: Secondary | ICD-10-CM

## 2018-10-15 DIAGNOSIS — N2581 Secondary hyperparathyroidism of renal origin: Secondary | ICD-10-CM | POA: Diagnosis not present

## 2018-10-15 DIAGNOSIS — N186 End stage renal disease: Secondary | ICD-10-CM | POA: Diagnosis not present

## 2018-10-17 DIAGNOSIS — N2581 Secondary hyperparathyroidism of renal origin: Secondary | ICD-10-CM | POA: Diagnosis not present

## 2018-10-17 DIAGNOSIS — N186 End stage renal disease: Secondary | ICD-10-CM | POA: Diagnosis not present

## 2018-10-18 ENCOUNTER — Other Ambulatory Visit: Payer: Self-pay | Admitting: Nurse Practitioner

## 2018-10-18 DIAGNOSIS — N186 End stage renal disease: Secondary | ICD-10-CM | POA: Diagnosis not present

## 2018-10-18 DIAGNOSIS — N2581 Secondary hyperparathyroidism of renal origin: Secondary | ICD-10-CM | POA: Diagnosis not present

## 2018-10-22 DIAGNOSIS — N2581 Secondary hyperparathyroidism of renal origin: Secondary | ICD-10-CM | POA: Diagnosis not present

## 2018-10-22 DIAGNOSIS — E1129 Type 2 diabetes mellitus with other diabetic kidney complication: Secondary | ICD-10-CM | POA: Diagnosis not present

## 2018-10-22 DIAGNOSIS — Z992 Dependence on renal dialysis: Secondary | ICD-10-CM | POA: Diagnosis not present

## 2018-10-22 DIAGNOSIS — N186 End stage renal disease: Secondary | ICD-10-CM | POA: Diagnosis not present

## 2018-10-24 DIAGNOSIS — N186 End stage renal disease: Secondary | ICD-10-CM | POA: Diagnosis not present

## 2018-10-24 DIAGNOSIS — N2581 Secondary hyperparathyroidism of renal origin: Secondary | ICD-10-CM | POA: Diagnosis not present

## 2018-10-26 DIAGNOSIS — N186 End stage renal disease: Secondary | ICD-10-CM | POA: Diagnosis not present

## 2018-10-26 DIAGNOSIS — N2581 Secondary hyperparathyroidism of renal origin: Secondary | ICD-10-CM | POA: Diagnosis not present

## 2018-10-29 DIAGNOSIS — N186 End stage renal disease: Secondary | ICD-10-CM | POA: Diagnosis not present

## 2018-10-29 DIAGNOSIS — N2581 Secondary hyperparathyroidism of renal origin: Secondary | ICD-10-CM | POA: Diagnosis not present

## 2018-10-31 DIAGNOSIS — N186 End stage renal disease: Secondary | ICD-10-CM | POA: Diagnosis not present

## 2018-10-31 DIAGNOSIS — N2581 Secondary hyperparathyroidism of renal origin: Secondary | ICD-10-CM | POA: Diagnosis not present

## 2018-11-01 ENCOUNTER — Other Ambulatory Visit: Payer: Self-pay

## 2018-11-01 ENCOUNTER — Encounter: Payer: Self-pay | Admitting: Nurse Practitioner

## 2018-11-01 ENCOUNTER — Ambulatory Visit (INDEPENDENT_AMBULATORY_CARE_PROVIDER_SITE_OTHER): Payer: Medicare HMO | Admitting: Nurse Practitioner

## 2018-11-01 VITALS — BP 144/78 | HR 83 | Temp 97.4°F | Ht 65.0 in | Wt 184.0 lb

## 2018-11-01 DIAGNOSIS — I10 Essential (primary) hypertension: Secondary | ICD-10-CM | POA: Diagnosis not present

## 2018-11-01 DIAGNOSIS — F320 Major depressive disorder, single episode, mild: Secondary | ICD-10-CM | POA: Diagnosis not present

## 2018-11-01 DIAGNOSIS — N185 Chronic kidney disease, stage 5: Secondary | ICD-10-CM

## 2018-11-01 DIAGNOSIS — L309 Dermatitis, unspecified: Secondary | ICD-10-CM

## 2018-11-01 DIAGNOSIS — F419 Anxiety disorder, unspecified: Secondary | ICD-10-CM

## 2018-11-01 DIAGNOSIS — G4700 Insomnia, unspecified: Secondary | ICD-10-CM | POA: Diagnosis not present

## 2018-11-01 DIAGNOSIS — E1149 Type 2 diabetes mellitus with other diabetic neurological complication: Secondary | ICD-10-CM | POA: Diagnosis not present

## 2018-11-01 DIAGNOSIS — E1122 Type 2 diabetes mellitus with diabetic chronic kidney disease: Secondary | ICD-10-CM | POA: Diagnosis not present

## 2018-11-01 DIAGNOSIS — M1711 Unilateral primary osteoarthritis, right knee: Secondary | ICD-10-CM

## 2018-11-01 DIAGNOSIS — Z8673 Personal history of transient ischemic attack (TIA), and cerebral infarction without residual deficits: Secondary | ICD-10-CM

## 2018-11-01 DIAGNOSIS — Z794 Long term (current) use of insulin: Secondary | ICD-10-CM | POA: Diagnosis not present

## 2018-11-01 DIAGNOSIS — Z720 Tobacco use: Secondary | ICD-10-CM

## 2018-11-01 DIAGNOSIS — E785 Hyperlipidemia, unspecified: Secondary | ICD-10-CM

## 2018-11-01 DIAGNOSIS — D631 Anemia in chronic kidney disease: Secondary | ICD-10-CM

## 2018-11-01 MED ORDER — TRIAMCINOLONE ACETONIDE 0.1 % EX CREA: 1.0000 "application " | TOPICAL_CREAM | Freq: Three times a day (TID) | CUTANEOUS | 1 refills | Status: DC | PRN

## 2018-11-01 NOTE — Patient Instructions (Addendum)
To have dialysis obtain an A1c prior to next follow up At next follow up bring updated labs.

## 2018-11-01 NOTE — Progress Notes (Signed)
Careteam: Patient Care Team: [name], NP as PCP - General (Nurse Practitioner) [name], MD as Consulting Physician (Ophthalmology) [name], MD as Consulting Physician (Nephrology) [name], NP as Nurse Practitioner (Nurse Practitioner)  Advanced Directive information Does Patient Have a Medical Advance Directive?: Yes, Type of Advance Directive: Out of facility DNR (pink MOST or yellow form), Pre-existing out of facility DNR order (yellow form or pink MOST form): Yellow form placed in chart (order not valid for inpatient use), Does patient want to make changes to medical advance directive?: No - Patient declined  Allergies  Allergen Reactions  . Lisinopril Other (See Comments)    Abnormal Kidney Function   . Pioglitazone Other (See Comments)    REACTION: Desquamation of skin of the palm  . Morphine And Related Nausea And Vomiting  . Sulfonamide Derivatives Rash    Chief Complaint  Patient presents with  . Medical Management of Chronic Issues    3 month follow-up and right ear concerns (echo)   . Medication Refill    Refill Kenalog cream      HPI: Patient is a 73 y.o. female seen in the office today for routine follow up.  Forgot to bring paperwork from dialysis.   ESRD- HD Tuesday, Thursday and Saturday. Continues on cacium acetate TID, sevelamer carbonate pack TID,   Cinacalcet changed to dialysis days only  Anxiety and depression- reports mood is much better since changing to zoloft, has not needed to use xanax often. No side effects noted.   Insomnia- continues on trazodone 100 mg at bedtime which has been beneficial.   Hyperlipidemia- continues on zetia and pravachol; LDL at goal.    Review of Systems:  Review of Systems  Constitutional: Negative for chills, fever, malaise/fatigue and weight loss.  HENT: Positive for congestion. Negative for ear discharge, ear pain, sinus pain and tinnitus.        Occasionally will have a  full feeling in right ear that occurs when swallowing  Respiratory: Negative for cough, sputum production and shortness of breath.   Cardiovascular: Negative for chest pain, palpitations and leg swelling.  Gastrointestinal: Negative for abdominal pain, constipation, diarrhea and heartburn.  Genitourinary: Negative for urgency.       Continues to urinate, on HD  Musculoskeletal: Negative for joint pain and myalgias.  Skin: Negative.   Neurological: Negative for dizziness and headaches.  Endo/Heme/Allergies: Positive for environmental allergies.  Psychiatric/Behavioral: Positive for depression. Negative for memory loss and suicidal ideas. The patient is nervous/anxious. The patient does not have insomnia.        Anxiety and depression have improved on zoloft    Past Medical History:  Diagnosis Date  . Anemia   . Anxiety   . Arthritis    lower back and knees   . Asthma    childhood  . Barrett's esophagus   . Chronic kidney disease   . Coagulation defect ([place])   . Depression   . Diabetes mellitus   . Dysphagia   . Eczema   . End stage renal disease ([place])   . Fluid overload, unspecified   . History of herpes zoster 02/2010   Recovered fully after period of acute herpetic neuralgia tx w/ gabapentin.   Marland Kitchen Hx MRSA infection 2009  . Hypercalcemia   . Hyperlipidemia   . Hypertension   . Moderate protein-calorie malnutrition ([place])   . Neuromuscular disorder (HCC)    chronic pain  . Personal history of colonic polyps 10/17/2010  hyperplastic   Past Surgical History:  Procedure Laterality Date  . ABDOMINAL HYSTERECTOMY     unclear when  . AV FISTULA PLACEMENT Left 02/13/2017   Procedure: ARTERIOVENOUS (AV) GORE-TEX STRETCH GRAFT INSERTION INTO LEFT ARM;  Surgeon: Elam Dutch, MD;  Location: [facility];  Service: Vascular;  Laterality: Left;  . CATARACT EXTRACTION Bilateral   . thigh surg     left side due to MRSA   Social History:   reports that she has been smoking  cigarettes. She has a 40.00 pack-year smoking history. She has never used smokeless tobacco. She reports current alcohol use of about 2.0 standard drinks of alcohol per week. She reports that she does not use drugs.  Family History  Problem Relation Age of Onset  . Hypertension Father   . Kidney disease Father   . Diabetes Brother   . Colon cancer Neg Hx   . CAD Neg Hx     Medications: Patient's Medications  New Prescriptions   No medications on file  Previous Medications   ALPRAZOLAM (XANAX) 0.25 MG TABLET    Take 1 tablet (0.25 mg total) by mouth daily as needed for anxiety.   ASPIRIN 81 MG CHEWABLE TABLET    Chew 1 tablet (81 mg total) by mouth daily.   CALCIUM ACETATE (PHOSLO) 667 MG CAPSULE    Take by mouth 3 (three) times daily with meals.   CINACALCET (SENSIPAR) 30 MG TABLET    Take 30 mg by mouth daily.   CLOPIDOGREL (PLAVIX) 75 MG TABLET    TAKE 1 TABLET (75 MG TOTAL) BY MOUTH DAILY. DO NOT USE BLISTER PACKS   EZETIMIBE (ZETIA) 10 MG TABLET    TAKE 1 TABLET BY MOUTH EVERY DAY   PRAVASTATIN (PRAVACHOL) 40 MG TABLET    TAKE 1 TABLET BY MOUTH EVERYDAY AT BEDTIME   SERTRALINE (ZOLOFT) 50 MG TABLET    TAKE 1 TABLET BY MOUTH EVERY DAY   SEVELAMER CARBONATE (RENVELA) 2.4 G PACK    2.4 g 3 (three) times daily with meals.   TRAZODONE (DESYREL) 100 MG TABLET    TAKE 1 TABLET (100 MG TOTAL) BY MOUTH AT BEDTIME. FOR SLEEP   TRIAMCINOLONE CREAM (KENALOG) 0.1 %    APPLY 1 APPLICATION TOPICALLY 3 (THREE) TIMES DAILY AS NEEDED (RASH).  Modified Medications   No medications on file  Discontinued Medications   LATANOPROST (XALATAN) 0.005 % OPHTHALMIC SOLUTION    Place 1 drop into both eyes at bedtime. Reported on 08/27/2015   PANTOPRAZOLE (PROTONIX) 40 MG TABLET    TAKE 1 TABLET BY MOUTH EVERY DAY    Physical Exam:  Vitals:   11/01/18 0825  BP: (!) 144/78  Pulse: 83  Temp: (!) 97.4 F (36.3 C)  TempSrc: Oral  SpO2: 97%  Weight: 184 lb (83.5 kg)  Height: 5\' 5"  (1.651 m)   Body mass  index is 30.62 kg/m. Wt Readings from Last 3 Encounters:  11/01/18 184 lb (83.5 kg)  09/20/18 186 lb (84.4 kg)  07/30/18 165 lb (74.8 kg)    Physical Exam Constitutional:      General: She is not in acute distress.    Appearance: She is well-developed.  HENT:     Head: Normocephalic and atraumatic.  Eyes:     Conjunctiva/sclera: Conjunctivae normal.     Pupils: Pupils are equal, round, and reactive to light.  Cardiovascular:     Rate and Rhythm: Normal rate and regular rhythm.     Heart sounds: Normal  heart sounds. No murmur.  Pulmonary:     Effort: Pulmonary effort is normal. No respiratory distress.     Breath sounds: Normal breath sounds. No wheezing.  Skin:    General: Skin is warm and dry.  Neurological:     Mental Status: She is alert and oriented to person, place, and time.  Psychiatric:        Mood and Affect: Affect is flat.        Behavior: Behavior normal.        Thought Content: Thought content normal.        Cognition and Memory: Cognition normal.        Judgment: Judgment normal.     Comments: In better spirits today     Labs reviewed: Basic Metabolic Panel: Recent Labs    03/04/18 0923  05/09/18 1114 07/30/18 1311 08/15/18  NA  --   --  138 138  --   K  --    < > 3.8 3.9 5.9*  CL  --   --  93* 93*  --   CO2  --   --  33* 30  --   GLUCOSE  --   --  102* 126*  --   BUN  --   --  12 18  --   CREATININE  --   --  4.22* 6.50*  --   CALCIUM  --   --  8.5* 9.7  --   TSH 1.57  --   --   --   --    < > = values in this interval not displayed.   Liver Function Tests: Recent Labs    03/04/18 0923  AST 13  ALT 14  BILITOT 0.4  PROT 6.7   No results for input(s): LIPASE, AMYLASE in the last 8760 hours. No results for input(s): AMMONIA in the last 8760 hours. CBC: Recent Labs    05/09/18 1114 07/30/18 1311 08/15/18  WBC 5.2 7.8  --   NEUTROABS 3.1  --   --   HGB 12.2 12.9 11.0*  HCT 39.3 39.7  --   MCV 98.0 94.5  --   PLT 231 230  --     Lipid Panel: Recent Labs    08/15/18  CHOL 131  HDL 42  LDLCALC 61  TRIG 139   TSH: Recent Labs    03/04/18 0923  TSH 1.57   A1C: Lab Results  Component Value Date   HGBA1C 5.5 08/08/2018     Assessment/Plan 1. Essential hypertension Slightly elevated today however since she is on HD with hx of hypotension continues off medication. Encouraged dietary compliance.   2. 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 To continue dietary modifications.   3. Dermatitis -occasional flare, will use triamcinolone cream which has been beneficial.  - triamcinolone cream (KENALOG) 0.1 %; Apply 1 application topically 3 (three) times daily as needed (rash).  Dispense: 80 g; Refill: 1  4. Primary osteoarthritis of right knee Stable at this time.   5. Anemia due to stage 5 chronic kidney disease, not on chronic dialysis ([place]) Followed by nephrologist, she plans to bring labs to follow up so we can update.  6. Hyperlipidemia with target LDL less than 70 At goal in April, continues on pravastatin 40 mg daily   7. Insomnia, unspecified type Stable on trazodone 100 mg  daily  8. Tobacco abuse Encouraged cessatio   9. Mild single current episode of major depressive disorder (HCC) Overall mood has improved since last office visit, decrease in anxiety and depression. Rarely using alprazolam. Will continue on zoloft 50 mg daily   10. Anxiety See number 9  11. History of stroke Stable, with some memory deficit but over does well. Continues on ASA 81 mg daily with plavix 75 mg daily   Next appt: 02/03/2019 Janett Billow K. [employer], [facility] [phone]

## 2018-11-02 DIAGNOSIS — N2581 Secondary hyperparathyroidism of renal origin: Secondary | ICD-10-CM | POA: Diagnosis not present

## 2018-11-02 DIAGNOSIS — N186 End stage renal disease: Secondary | ICD-10-CM | POA: Diagnosis not present

## 2018-11-05 DIAGNOSIS — N2581 Secondary hyperparathyroidism of renal origin: Secondary | ICD-10-CM | POA: Diagnosis not present

## 2018-11-05 DIAGNOSIS — N186 End stage renal disease: Secondary | ICD-10-CM | POA: Diagnosis not present

## 2018-11-07 DIAGNOSIS — N186 End stage renal disease: Secondary | ICD-10-CM | POA: Diagnosis not present

## 2018-11-07 DIAGNOSIS — N2581 Secondary hyperparathyroidism of renal origin: Secondary | ICD-10-CM | POA: Diagnosis not present

## 2018-11-09 DIAGNOSIS — N2581 Secondary hyperparathyroidism of renal origin: Secondary | ICD-10-CM | POA: Diagnosis not present

## 2018-11-09 DIAGNOSIS — N186 End stage renal disease: Secondary | ICD-10-CM | POA: Diagnosis not present

## 2018-11-12 DIAGNOSIS — N2581 Secondary hyperparathyroidism of renal origin: Secondary | ICD-10-CM | POA: Diagnosis not present

## 2018-11-12 DIAGNOSIS — N186 End stage renal disease: Secondary | ICD-10-CM | POA: Diagnosis not present

## 2018-11-14 DIAGNOSIS — N186 End stage renal disease: Secondary | ICD-10-CM | POA: Diagnosis not present

## 2018-11-14 DIAGNOSIS — N2581 Secondary hyperparathyroidism of renal origin: Secondary | ICD-10-CM | POA: Diagnosis not present

## 2018-11-16 DIAGNOSIS — N186 End stage renal disease: Secondary | ICD-10-CM | POA: Diagnosis not present

## 2018-11-16 DIAGNOSIS — N2581 Secondary hyperparathyroidism of renal origin: Secondary | ICD-10-CM | POA: Diagnosis not present

## 2018-11-19 DIAGNOSIS — N186 End stage renal disease: Secondary | ICD-10-CM | POA: Diagnosis not present

## 2018-11-19 DIAGNOSIS — N2581 Secondary hyperparathyroidism of renal origin: Secondary | ICD-10-CM | POA: Diagnosis not present

## 2018-11-21 DIAGNOSIS — N2581 Secondary hyperparathyroidism of renal origin: Secondary | ICD-10-CM | POA: Diagnosis not present

## 2018-11-21 DIAGNOSIS — N186 End stage renal disease: Secondary | ICD-10-CM | POA: Diagnosis not present

## 2018-11-22 DIAGNOSIS — N186 End stage renal disease: Secondary | ICD-10-CM | POA: Diagnosis not present

## 2018-11-22 DIAGNOSIS — Z992 Dependence on renal dialysis: Secondary | ICD-10-CM | POA: Diagnosis not present

## 2018-11-22 DIAGNOSIS — E1129 Type 2 diabetes mellitus with other diabetic kidney complication: Secondary | ICD-10-CM | POA: Diagnosis not present

## 2018-11-23 DIAGNOSIS — Z992 Dependence on renal dialysis: Secondary | ICD-10-CM | POA: Diagnosis not present

## 2018-11-23 DIAGNOSIS — N2581 Secondary hyperparathyroidism of renal origin: Secondary | ICD-10-CM | POA: Diagnosis not present

## 2018-11-23 DIAGNOSIS — N186 End stage renal disease: Secondary | ICD-10-CM | POA: Diagnosis not present

## 2018-11-26 DIAGNOSIS — N2581 Secondary hyperparathyroidism of renal origin: Secondary | ICD-10-CM | POA: Diagnosis not present

## 2018-11-26 DIAGNOSIS — Z992 Dependence on renal dialysis: Secondary | ICD-10-CM | POA: Diagnosis not present

## 2018-11-26 DIAGNOSIS — N186 End stage renal disease: Secondary | ICD-10-CM | POA: Diagnosis not present

## 2018-11-28 DIAGNOSIS — Z992 Dependence on renal dialysis: Secondary | ICD-10-CM | POA: Diagnosis not present

## 2018-11-28 DIAGNOSIS — N186 End stage renal disease: Secondary | ICD-10-CM | POA: Diagnosis not present

## 2018-11-28 DIAGNOSIS — N2581 Secondary hyperparathyroidism of renal origin: Secondary | ICD-10-CM | POA: Diagnosis not present

## 2018-11-30 DIAGNOSIS — N186 End stage renal disease: Secondary | ICD-10-CM | POA: Diagnosis not present

## 2018-11-30 DIAGNOSIS — N2581 Secondary hyperparathyroidism of renal origin: Secondary | ICD-10-CM | POA: Diagnosis not present

## 2018-11-30 DIAGNOSIS — Z992 Dependence on renal dialysis: Secondary | ICD-10-CM | POA: Diagnosis not present

## 2018-12-03 DIAGNOSIS — N186 End stage renal disease: Secondary | ICD-10-CM | POA: Diagnosis not present

## 2018-12-03 DIAGNOSIS — N2581 Secondary hyperparathyroidism of renal origin: Secondary | ICD-10-CM | POA: Diagnosis not present

## 2018-12-03 DIAGNOSIS — Z992 Dependence on renal dialysis: Secondary | ICD-10-CM | POA: Diagnosis not present

## 2018-12-05 DIAGNOSIS — N186 End stage renal disease: Secondary | ICD-10-CM | POA: Diagnosis not present

## 2018-12-05 DIAGNOSIS — Z992 Dependence on renal dialysis: Secondary | ICD-10-CM | POA: Diagnosis not present

## 2018-12-05 DIAGNOSIS — N2581 Secondary hyperparathyroidism of renal origin: Secondary | ICD-10-CM | POA: Diagnosis not present

## 2018-12-07 DIAGNOSIS — N2581 Secondary hyperparathyroidism of renal origin: Secondary | ICD-10-CM | POA: Diagnosis not present

## 2018-12-07 DIAGNOSIS — N186 End stage renal disease: Secondary | ICD-10-CM | POA: Diagnosis not present

## 2018-12-07 DIAGNOSIS — Z992 Dependence on renal dialysis: Secondary | ICD-10-CM | POA: Diagnosis not present

## 2018-12-10 DIAGNOSIS — Z992 Dependence on renal dialysis: Secondary | ICD-10-CM | POA: Diagnosis not present

## 2018-12-10 DIAGNOSIS — N186 End stage renal disease: Secondary | ICD-10-CM | POA: Diagnosis not present

## 2018-12-10 DIAGNOSIS — N2581 Secondary hyperparathyroidism of renal origin: Secondary | ICD-10-CM | POA: Diagnosis not present

## 2018-12-12 DIAGNOSIS — N186 End stage renal disease: Secondary | ICD-10-CM | POA: Diagnosis not present

## 2018-12-12 DIAGNOSIS — Z992 Dependence on renal dialysis: Secondary | ICD-10-CM | POA: Diagnosis not present

## 2018-12-12 DIAGNOSIS — N2581 Secondary hyperparathyroidism of renal origin: Secondary | ICD-10-CM | POA: Diagnosis not present

## 2018-12-14 DIAGNOSIS — N186 End stage renal disease: Secondary | ICD-10-CM | POA: Diagnosis not present

## 2018-12-14 DIAGNOSIS — N2581 Secondary hyperparathyroidism of renal origin: Secondary | ICD-10-CM | POA: Diagnosis not present

## 2018-12-14 DIAGNOSIS — Z992 Dependence on renal dialysis: Secondary | ICD-10-CM | POA: Diagnosis not present

## 2018-12-17 DIAGNOSIS — Z992 Dependence on renal dialysis: Secondary | ICD-10-CM | POA: Diagnosis not present

## 2018-12-17 DIAGNOSIS — N2581 Secondary hyperparathyroidism of renal origin: Secondary | ICD-10-CM | POA: Diagnosis not present

## 2018-12-17 DIAGNOSIS — N186 End stage renal disease: Secondary | ICD-10-CM | POA: Diagnosis not present

## 2018-12-19 DIAGNOSIS — Z992 Dependence on renal dialysis: Secondary | ICD-10-CM | POA: Diagnosis not present

## 2018-12-19 DIAGNOSIS — N2581 Secondary hyperparathyroidism of renal origin: Secondary | ICD-10-CM | POA: Diagnosis not present

## 2018-12-19 DIAGNOSIS — N186 End stage renal disease: Secondary | ICD-10-CM | POA: Diagnosis not present

## 2018-12-21 DIAGNOSIS — N186 End stage renal disease: Secondary | ICD-10-CM | POA: Diagnosis not present

## 2018-12-21 DIAGNOSIS — Z992 Dependence on renal dialysis: Secondary | ICD-10-CM | POA: Diagnosis not present

## 2018-12-21 DIAGNOSIS — N2581 Secondary hyperparathyroidism of renal origin: Secondary | ICD-10-CM | POA: Diagnosis not present

## 2018-12-23 DIAGNOSIS — E1129 Type 2 diabetes mellitus with other diabetic kidney complication: Secondary | ICD-10-CM | POA: Diagnosis not present

## 2018-12-23 DIAGNOSIS — N186 End stage renal disease: Secondary | ICD-10-CM | POA: Diagnosis not present

## 2018-12-23 DIAGNOSIS — Z992 Dependence on renal dialysis: Secondary | ICD-10-CM | POA: Diagnosis not present

## 2018-12-24 DIAGNOSIS — Z992 Dependence on renal dialysis: Secondary | ICD-10-CM | POA: Diagnosis not present

## 2018-12-24 DIAGNOSIS — N186 End stage renal disease: Secondary | ICD-10-CM | POA: Diagnosis not present

## 2018-12-24 DIAGNOSIS — N2581 Secondary hyperparathyroidism of renal origin: Secondary | ICD-10-CM | POA: Diagnosis not present

## 2018-12-26 DIAGNOSIS — N186 End stage renal disease: Secondary | ICD-10-CM | POA: Diagnosis not present

## 2018-12-26 DIAGNOSIS — N2581 Secondary hyperparathyroidism of renal origin: Secondary | ICD-10-CM | POA: Diagnosis not present

## 2018-12-26 DIAGNOSIS — Z992 Dependence on renal dialysis: Secondary | ICD-10-CM | POA: Diagnosis not present

## 2018-12-28 DIAGNOSIS — N2581 Secondary hyperparathyroidism of renal origin: Secondary | ICD-10-CM | POA: Diagnosis not present

## 2018-12-28 DIAGNOSIS — Z992 Dependence on renal dialysis: Secondary | ICD-10-CM | POA: Diagnosis not present

## 2018-12-28 DIAGNOSIS — N186 End stage renal disease: Secondary | ICD-10-CM | POA: Diagnosis not present

## 2018-12-31 DIAGNOSIS — N186 End stage renal disease: Secondary | ICD-10-CM | POA: Diagnosis not present

## 2018-12-31 DIAGNOSIS — Z992 Dependence on renal dialysis: Secondary | ICD-10-CM | POA: Diagnosis not present

## 2018-12-31 DIAGNOSIS — N2581 Secondary hyperparathyroidism of renal origin: Secondary | ICD-10-CM | POA: Diagnosis not present

## 2019-01-01 DIAGNOSIS — H10413 Chronic giant papillary conjunctivitis, bilateral: Secondary | ICD-10-CM | POA: Diagnosis not present

## 2019-01-01 DIAGNOSIS — Z961 Presence of intraocular lens: Secondary | ICD-10-CM | POA: Diagnosis not present

## 2019-01-01 DIAGNOSIS — H401131 Primary open-angle glaucoma, bilateral, mild stage: Secondary | ICD-10-CM | POA: Diagnosis not present

## 2019-01-02 DIAGNOSIS — N2581 Secondary hyperparathyroidism of renal origin: Secondary | ICD-10-CM | POA: Diagnosis not present

## 2019-01-02 DIAGNOSIS — Z992 Dependence on renal dialysis: Secondary | ICD-10-CM | POA: Diagnosis not present

## 2019-01-02 DIAGNOSIS — N186 End stage renal disease: Secondary | ICD-10-CM | POA: Diagnosis not present

## 2019-01-04 DIAGNOSIS — N2581 Secondary hyperparathyroidism of renal origin: Secondary | ICD-10-CM | POA: Diagnosis not present

## 2019-01-04 DIAGNOSIS — N186 End stage renal disease: Secondary | ICD-10-CM | POA: Diagnosis not present

## 2019-01-04 DIAGNOSIS — Z992 Dependence on renal dialysis: Secondary | ICD-10-CM | POA: Diagnosis not present

## 2019-01-07 DIAGNOSIS — N186 End stage renal disease: Secondary | ICD-10-CM | POA: Diagnosis not present

## 2019-01-07 DIAGNOSIS — Z992 Dependence on renal dialysis: Secondary | ICD-10-CM | POA: Diagnosis not present

## 2019-01-07 DIAGNOSIS — N2581 Secondary hyperparathyroidism of renal origin: Secondary | ICD-10-CM | POA: Diagnosis not present

## 2019-01-09 DIAGNOSIS — Z992 Dependence on renal dialysis: Secondary | ICD-10-CM | POA: Diagnosis not present

## 2019-01-09 DIAGNOSIS — N186 End stage renal disease: Secondary | ICD-10-CM | POA: Diagnosis not present

## 2019-01-09 DIAGNOSIS — N2581 Secondary hyperparathyroidism of renal origin: Secondary | ICD-10-CM | POA: Diagnosis not present

## 2019-01-11 DIAGNOSIS — N2581 Secondary hyperparathyroidism of renal origin: Secondary | ICD-10-CM | POA: Diagnosis not present

## 2019-01-11 DIAGNOSIS — Z992 Dependence on renal dialysis: Secondary | ICD-10-CM | POA: Diagnosis not present

## 2019-01-11 DIAGNOSIS — N186 End stage renal disease: Secondary | ICD-10-CM | POA: Diagnosis not present

## 2019-01-14 DIAGNOSIS — Z992 Dependence on renal dialysis: Secondary | ICD-10-CM | POA: Diagnosis not present

## 2019-01-14 DIAGNOSIS — N2581 Secondary hyperparathyroidism of renal origin: Secondary | ICD-10-CM | POA: Diagnosis not present

## 2019-01-14 DIAGNOSIS — N186 End stage renal disease: Secondary | ICD-10-CM | POA: Diagnosis not present

## 2019-01-16 DIAGNOSIS — Z992 Dependence on renal dialysis: Secondary | ICD-10-CM | POA: Diagnosis not present

## 2019-01-16 DIAGNOSIS — N2581 Secondary hyperparathyroidism of renal origin: Secondary | ICD-10-CM | POA: Diagnosis not present

## 2019-01-16 DIAGNOSIS — N186 End stage renal disease: Secondary | ICD-10-CM | POA: Diagnosis not present

## 2019-01-18 DIAGNOSIS — N186 End stage renal disease: Secondary | ICD-10-CM | POA: Diagnosis not present

## 2019-01-18 DIAGNOSIS — Z992 Dependence on renal dialysis: Secondary | ICD-10-CM | POA: Diagnosis not present

## 2019-01-18 DIAGNOSIS — N2581 Secondary hyperparathyroidism of renal origin: Secondary | ICD-10-CM | POA: Diagnosis not present

## 2019-01-21 DIAGNOSIS — N2581 Secondary hyperparathyroidism of renal origin: Secondary | ICD-10-CM | POA: Diagnosis not present

## 2019-01-21 DIAGNOSIS — Z992 Dependence on renal dialysis: Secondary | ICD-10-CM | POA: Diagnosis not present

## 2019-01-21 DIAGNOSIS — N186 End stage renal disease: Secondary | ICD-10-CM | POA: Diagnosis not present

## 2019-01-22 DIAGNOSIS — Z992 Dependence on renal dialysis: Secondary | ICD-10-CM | POA: Diagnosis not present

## 2019-01-22 DIAGNOSIS — E1129 Type 2 diabetes mellitus with other diabetic kidney complication: Secondary | ICD-10-CM | POA: Diagnosis not present

## 2019-01-22 DIAGNOSIS — N186 End stage renal disease: Secondary | ICD-10-CM | POA: Diagnosis not present

## 2019-01-23 DIAGNOSIS — Z992 Dependence on renal dialysis: Secondary | ICD-10-CM | POA: Diagnosis not present

## 2019-01-23 DIAGNOSIS — N186 End stage renal disease: Secondary | ICD-10-CM | POA: Diagnosis not present

## 2019-01-23 DIAGNOSIS — N2581 Secondary hyperparathyroidism of renal origin: Secondary | ICD-10-CM | POA: Diagnosis not present

## 2019-01-25 DIAGNOSIS — N186 End stage renal disease: Secondary | ICD-10-CM | POA: Diagnosis not present

## 2019-01-25 DIAGNOSIS — N2581 Secondary hyperparathyroidism of renal origin: Secondary | ICD-10-CM | POA: Diagnosis not present

## 2019-01-25 DIAGNOSIS — Z992 Dependence on renal dialysis: Secondary | ICD-10-CM | POA: Diagnosis not present

## 2019-01-28 DIAGNOSIS — N2581 Secondary hyperparathyroidism of renal origin: Secondary | ICD-10-CM | POA: Diagnosis not present

## 2019-01-28 DIAGNOSIS — Z992 Dependence on renal dialysis: Secondary | ICD-10-CM | POA: Diagnosis not present

## 2019-01-28 DIAGNOSIS — N186 End stage renal disease: Secondary | ICD-10-CM | POA: Diagnosis not present

## 2019-01-30 DIAGNOSIS — N186 End stage renal disease: Secondary | ICD-10-CM | POA: Diagnosis not present

## 2019-01-30 DIAGNOSIS — Z992 Dependence on renal dialysis: Secondary | ICD-10-CM | POA: Diagnosis not present

## 2019-01-30 DIAGNOSIS — N2581 Secondary hyperparathyroidism of renal origin: Secondary | ICD-10-CM | POA: Diagnosis not present

## 2019-02-01 DIAGNOSIS — Z992 Dependence on renal dialysis: Secondary | ICD-10-CM | POA: Diagnosis not present

## 2019-02-01 DIAGNOSIS — N2581 Secondary hyperparathyroidism of renal origin: Secondary | ICD-10-CM | POA: Diagnosis not present

## 2019-02-01 DIAGNOSIS — N186 End stage renal disease: Secondary | ICD-10-CM | POA: Diagnosis not present

## 2019-02-03 ENCOUNTER — Encounter: Payer: Self-pay | Admitting: Nurse Practitioner

## 2019-02-03 ENCOUNTER — Ambulatory Visit: Payer: Self-pay

## 2019-02-03 DIAGNOSIS — Z961 Presence of intraocular lens: Secondary | ICD-10-CM | POA: Diagnosis not present

## 2019-02-03 DIAGNOSIS — H10413 Chronic giant papillary conjunctivitis, bilateral: Secondary | ICD-10-CM | POA: Diagnosis not present

## 2019-02-03 DIAGNOSIS — H401131 Primary open-angle glaucoma, bilateral, mild stage: Secondary | ICD-10-CM | POA: Diagnosis not present

## 2019-02-04 DIAGNOSIS — N186 End stage renal disease: Secondary | ICD-10-CM | POA: Diagnosis not present

## 2019-02-04 DIAGNOSIS — Z992 Dependence on renal dialysis: Secondary | ICD-10-CM | POA: Diagnosis not present

## 2019-02-04 DIAGNOSIS — N2581 Secondary hyperparathyroidism of renal origin: Secondary | ICD-10-CM | POA: Diagnosis not present

## 2019-02-06 DIAGNOSIS — N186 End stage renal disease: Secondary | ICD-10-CM | POA: Diagnosis not present

## 2019-02-06 DIAGNOSIS — N2581 Secondary hyperparathyroidism of renal origin: Secondary | ICD-10-CM | POA: Diagnosis not present

## 2019-02-06 DIAGNOSIS — Z992 Dependence on renal dialysis: Secondary | ICD-10-CM | POA: Diagnosis not present

## 2019-02-06 LAB — HEMOGLOBIN A1C: Hemoglobin A1C: 5.3

## 2019-02-08 DIAGNOSIS — N2581 Secondary hyperparathyroidism of renal origin: Secondary | ICD-10-CM | POA: Diagnosis not present

## 2019-02-08 DIAGNOSIS — Z992 Dependence on renal dialysis: Secondary | ICD-10-CM | POA: Diagnosis not present

## 2019-02-08 DIAGNOSIS — N186 End stage renal disease: Secondary | ICD-10-CM | POA: Diagnosis not present

## 2019-02-11 DIAGNOSIS — N186 End stage renal disease: Secondary | ICD-10-CM | POA: Diagnosis not present

## 2019-02-11 DIAGNOSIS — Z992 Dependence on renal dialysis: Secondary | ICD-10-CM | POA: Diagnosis not present

## 2019-02-11 DIAGNOSIS — N2581 Secondary hyperparathyroidism of renal origin: Secondary | ICD-10-CM | POA: Diagnosis not present

## 2019-02-13 DIAGNOSIS — N2581 Secondary hyperparathyroidism of renal origin: Secondary | ICD-10-CM | POA: Diagnosis not present

## 2019-02-13 DIAGNOSIS — N186 End stage renal disease: Secondary | ICD-10-CM | POA: Diagnosis not present

## 2019-02-13 DIAGNOSIS — Z992 Dependence on renal dialysis: Secondary | ICD-10-CM | POA: Diagnosis not present

## 2019-02-14 ENCOUNTER — Other Ambulatory Visit: Payer: Self-pay | Admitting: Nurse Practitioner

## 2019-02-14 ENCOUNTER — Encounter: Payer: Self-pay | Admitting: *Deleted

## 2019-02-14 DIAGNOSIS — I639 Cerebral infarction, unspecified: Secondary | ICD-10-CM

## 2019-02-14 DIAGNOSIS — F418 Other specified anxiety disorders: Secondary | ICD-10-CM

## 2019-02-14 NOTE — Telephone Encounter (Signed)
Paxil declined for it is not on active medication list

## 2019-02-15 DIAGNOSIS — N2581 Secondary hyperparathyroidism of renal origin: Secondary | ICD-10-CM | POA: Diagnosis not present

## 2019-02-15 DIAGNOSIS — Z992 Dependence on renal dialysis: Secondary | ICD-10-CM | POA: Diagnosis not present

## 2019-02-15 DIAGNOSIS — N186 End stage renal disease: Secondary | ICD-10-CM | POA: Diagnosis not present

## 2019-02-18 DIAGNOSIS — N2581 Secondary hyperparathyroidism of renal origin: Secondary | ICD-10-CM | POA: Diagnosis not present

## 2019-02-18 DIAGNOSIS — Z992 Dependence on renal dialysis: Secondary | ICD-10-CM | POA: Diagnosis not present

## 2019-02-18 DIAGNOSIS — N186 End stage renal disease: Secondary | ICD-10-CM | POA: Diagnosis not present

## 2019-02-20 DIAGNOSIS — N186 End stage renal disease: Secondary | ICD-10-CM | POA: Diagnosis not present

## 2019-02-20 DIAGNOSIS — N2581 Secondary hyperparathyroidism of renal origin: Secondary | ICD-10-CM | POA: Diagnosis not present

## 2019-02-20 DIAGNOSIS — Z992 Dependence on renal dialysis: Secondary | ICD-10-CM | POA: Diagnosis not present

## 2019-02-21 ENCOUNTER — Encounter: Payer: Self-pay | Admitting: Nurse Practitioner

## 2019-02-21 ENCOUNTER — Other Ambulatory Visit: Payer: Self-pay

## 2019-02-21 ENCOUNTER — Ambulatory Visit (INDEPENDENT_AMBULATORY_CARE_PROVIDER_SITE_OTHER): Payer: Medicare HMO | Admitting: Nurse Practitioner

## 2019-02-21 ENCOUNTER — Telehealth: Payer: Self-pay

## 2019-02-21 VITALS — BP 136/78 | HR 86 | Temp 97.3°F | Ht 65.0 in | Wt 185.2 lb

## 2019-02-21 DIAGNOSIS — Z794 Long term (current) use of insulin: Secondary | ICD-10-CM | POA: Diagnosis not present

## 2019-02-21 DIAGNOSIS — Z720 Tobacco use: Secondary | ICD-10-CM

## 2019-02-21 DIAGNOSIS — G4700 Insomnia, unspecified: Secondary | ICD-10-CM

## 2019-02-21 DIAGNOSIS — F32A Depression, unspecified: Secondary | ICD-10-CM

## 2019-02-21 DIAGNOSIS — Z8673 Personal history of transient ischemic attack (TIA), and cerebral infarction without residual deficits: Secondary | ICD-10-CM

## 2019-02-21 DIAGNOSIS — N185 Chronic kidney disease, stage 5: Secondary | ICD-10-CM | POA: Diagnosis not present

## 2019-02-21 DIAGNOSIS — E1122 Type 2 diabetes mellitus with diabetic chronic kidney disease: Secondary | ICD-10-CM | POA: Diagnosis not present

## 2019-02-21 DIAGNOSIS — I10 Essential (primary) hypertension: Secondary | ICD-10-CM | POA: Diagnosis not present

## 2019-02-21 DIAGNOSIS — E1149 Type 2 diabetes mellitus with other diabetic neurological complication: Secondary | ICD-10-CM

## 2019-02-21 DIAGNOSIS — M1990 Unspecified osteoarthritis, unspecified site: Secondary | ICD-10-CM | POA: Diagnosis not present

## 2019-02-21 DIAGNOSIS — F419 Anxiety disorder, unspecified: Secondary | ICD-10-CM | POA: Diagnosis not present

## 2019-02-21 DIAGNOSIS — E785 Hyperlipidemia, unspecified: Secondary | ICD-10-CM | POA: Diagnosis not present

## 2019-02-21 DIAGNOSIS — D631 Anemia in chronic kidney disease: Secondary | ICD-10-CM

## 2019-02-21 DIAGNOSIS — F329 Major depressive disorder, single episode, unspecified: Secondary | ICD-10-CM

## 2019-02-21 MED ORDER — TRAZODONE HCL 100 MG PO TABS: 100.0000 mg | ORAL_TABLET | Freq: Every day | ORAL | 1 refills | Status: DC

## 2019-02-21 MED ORDER — PRAVASTATIN SODIUM 40 MG PO TABS: ORAL_TABLET | ORAL | 1 refills | Status: DC

## 2019-02-21 MED ORDER — TETANUS-DIPHTH-ACELL PERTUSSIS 5-2.5-18.5 LF-MCG/0.5 IM SUSP: 0.5000 mL | Freq: Once | INTRAMUSCULAR | 0 refills | Status: AC

## 2019-02-21 MED ORDER — SERTRALINE HCL 50 MG PO TABS: 50.0000 mg | ORAL_TABLET | Freq: Every day | ORAL | 1 refills | Status: DC

## 2019-02-21 NOTE — Telephone Encounter (Signed)
I called Dr.Groat's office and requested last DM eye exam. Per receptionist patient had an eye exam this month and she will fax results. Fax number confirmed

## 2019-02-21 NOTE — Progress Notes (Signed)
Careteam: Patient Care Team: [name], NP as PCP - General (Nurse Practitioner) [name], MD as Consulting Physician (Ophthalmology) [name], MD as Consulting Physician (Nephrology) [name], NP as Nurse Practitioner (Nurse Practitioner)  Advanced Directive information Does Patient Have a Medical Advance Directive?: No, Would patient like information on creating a medical advance directive?: No - Patient declined  Allergies  Allergen Reactions  . Lisinopril Other (See Comments)    Abnormal Kidney Function   . Pioglitazone Other (See Comments)    REACTION: Desquamation of skin of the palm  . Morphine And Related Nausea And Vomiting  . Sulfonamide Derivatives Rash    Chief Complaint  Patient presents with  . Medical Management of Chronic Issues    4 month follow-up. Patient c/o bilateral hand issues  . Quality Metric Gaps    Discuss need for eye exam   . Immunizations    Flu vaccine given at dialysis and RX for TD/TDaP sent to pharmacy   . Medication Refill    Refill medications, pravastatin, sertraline, trazodone      HPI: Patient is a 73 y.o. female seen in the office today for routine follow up.   DM- off all medication, last a1c 5.3. went earlier this month  Hyperlipidemia- LDL 61 on pravastatin and ezetimibe  ESRD on HD- continues on calcium acetate, sesipar, renvela   Anxiety/depression/insomnia- continues on trazodone 100 mg daily with zoloft 50 mg,   Pain at the base of bilateral thumbs, years ago had a fall and hit left hand there. Not causing any issues today but will bother her occasionally. Gets better with muscle rub.  Has occasional pain in other joints in her fingers but not bothering her today. No heat and swelling at this time  Hx of CVA-  Stable, continues on ASA, discuss importance of proper BP, cholesterol and blood sugar control.   Tobacco abuse- continues to smoke, not has much as she did, 1 pack per week.    Walks her dog every day for activity.   Review of Systems:  Review of Systems  Constitutional: Negative for chills, fever and weight loss.  Respiratory: Negative for cough, sputum production and shortness of breath.   Cardiovascular: Negative for chest pain, palpitations and leg swelling.  Gastrointestinal: Negative for abdominal pain, constipation, diarrhea and heartburn.  Genitourinary:       Continues to urinate on HD  Musculoskeletal: Positive for joint pain (occasional joint pain to hands). Negative for back pain, falls and myalgias.  Skin: Negative.   Neurological: Negative for dizziness and headaches.  Psychiatric/Behavioral: Positive for depression (occasionally). Negative for memory loss. The patient is not nervous/anxious and does not have insomnia.        Anxiety improved on zoloft    Past Medical History:  Diagnosis Date  . Anemia   . Anxiety   . Arthritis    lower back and knees   . Asthma    childhood  . Barrett's esophagus   . Chronic kidney disease   . Coagulation defect ([place])   . Depression   . Diabetes mellitus   . Dysphagia   . Eczema   . End stage renal disease ([place])   . Fluid overload, unspecified   . History of herpes zoster 02/2010   Recovered fully after period of acute herpetic neuralgia tx w/ gabapentin.   Marland Kitchen Hx MRSA infection 2009  . Hypercalcemia   . Hyperlipidemia   . Hypertension   . Moderate protein-calorie  malnutrition ([place])   . Neuromuscular disorder (HCC)    chronic pain  . Personal history of colonic polyps 10/17/2010   hyperplastic   Past Surgical History:  Procedure Laterality Date  . ABDOMINAL HYSTERECTOMY     unclear when  . AV FISTULA PLACEMENT Left 02/13/2017   Procedure: ARTERIOVENOUS (AV) GORE-TEX STRETCH GRAFT INSERTION INTO LEFT ARM;  Surgeon: Elam Dutch, MD;  Location: [facility];  Service: Vascular;  Laterality: Left;  . CATARACT EXTRACTION Bilateral   . thigh surg     left side due to MRSA   Social History:    reports that she has been smoking cigarettes. She has a 40.00 pack-year smoking history. She has never used smokeless tobacco. She reports current alcohol use of about 2.0 standard drinks of alcohol per week. She reports that she does not use drugs.  Family History  Problem Relation Age of Onset  . Hypertension Father   . Kidney disease Father   . Diabetes Brother   . Colon cancer Neg Hx   . CAD Neg Hx     Medications: Patient's Medications  New Prescriptions   No medications on file  Previous Medications   ALPRAZOLAM (XANAX) 0.25 MG TABLET    Take 1 tablet (0.25 mg total) by mouth daily as needed for anxiety.   ASPIRIN 81 MG CHEWABLE TABLET    Chew 1 tablet (81 mg total) by mouth daily.   CALCIUM ACETATE (PHOSLO) 667 MG CAPSULE    Take by mouth 3 (three) times daily with meals.   CINACALCET (SENSIPAR) 30 MG TABLET    Take 30 mg by mouth daily.   CLOPIDOGREL (PLAVIX) 75 MG TABLET    TAKE 1 TABLET (75 MG TOTAL) BY MOUTH DAILY. DO NOT USE BLISTER PACKS   EZETIMIBE (ZETIA) 10 MG TABLET    TAKE 1 TABLET BY MOUTH EVERY DAY   PRAVASTATIN (PRAVACHOL) 40 MG TABLET    TAKE 1 TABLET BY MOUTH EVERYDAY AT BEDTIME   SERTRALINE (ZOLOFT) 50 MG TABLET    TAKE 1 TABLET BY MOUTH EVERY DAY   SEVELAMER CARBONATE (RENVELA) 2.4 G PACK    2.4 g 3 (three) times daily with meals.   TDAP (BOOSTRIX) 5-2.5-18.5 LF-MCG/0.5 INJECTION    Inject 0.5 mLs into the muscle once.   TRAZODONE (DESYREL) 100 MG TABLET    TAKE 1 TABLET (100 MG TOTAL) BY MOUTH AT BEDTIME. FOR SLEEP   TRIAMCINOLONE CREAM (KENALOG) 0.1 %    Apply 1 application topically 3 (three) times daily as needed (rash).  Modified Medications   No medications on file  Discontinued Medications   No medications on file    Physical Exam:  Vitals:   02/21/19 1025  BP: 136/78  Pulse: 86  Temp: (!) 97.3 F (36.3 C)  TempSrc: Temporal  SpO2: 97%  Weight: 185 lb 3.2 oz (84 kg)  Height: 5\' 5"  (1.651 m)   Body mass index is 30.82 kg/m. Wt Readings  from Last 3 Encounters:  02/21/19 185 lb 3.2 oz (84 kg)  11/01/18 184 lb (83.5 kg)  09/20/18 186 lb (84.4 kg)    Physical Exam Constitutional:      General: She is not in acute distress.    Appearance: She is well-developed. She is not diaphoretic.  HENT:     Head: Normocephalic and atraumatic.  Eyes:     Conjunctiva/sclera: Conjunctivae normal.     Pupils: Pupils are equal, round, and reactive to light.  Neck:     Musculoskeletal:  Normal range of motion and neck supple.  Cardiovascular:     Rate and Rhythm: Normal rate and regular rhythm.     Heart sounds: Normal heart sounds.  Pulmonary:     Effort: Pulmonary effort is normal.     Breath sounds: Normal breath sounds.  Abdominal:     General: Bowel sounds are normal.     Palpations: Abdomen is soft.  Musculoskeletal:        General: No tenderness.  Skin:    General: Skin is warm and dry.  Neurological:     Mental Status: She is alert and oriented to person, place, and time.     Labs reviewed: Basic Metabolic Panel: Recent Labs    03/04/18 0923  05/09/18 1114 07/30/18 1311 08/15/18  NA  --   --  138 138  --   K  --    < > 3.8 3.9 5.9*  CL  --   --  93* 93*  --   CO2  --   --  33* 30  --   GLUCOSE  --   --  102* 126*  --   BUN  --   --  12 18  --   CREATININE  --   --  4.22* 6.50*  --   CALCIUM  --   --  8.5* 9.7  --   TSH 1.57  --   --   --   --    < > = values in this interval not displayed.   Liver Function Tests: Recent Labs    03/04/18 0923  AST 13  ALT 14  BILITOT 0.4  PROT 6.7   No results for input(s): LIPASE, AMYLASE in the last 8760 hours. No results for input(s): AMMONIA in the last 8760 hours. CBC: Recent Labs    05/09/18 1114 07/30/18 1311 08/15/18  WBC 5.2 7.8  --   NEUTROABS 3.1  --   --   HGB 12.2 12.9 11.0*  HCT 39.3 39.7  --   MCV 98.0 94.5  --   PLT 231 230  --    Lipid Panel: Recent Labs    08/15/18  CHOL 131  HDL 42  LDLCALC 61  TRIG 139   TSH: Recent Labs     03/04/18 0923  TSH 1.57   A1C: Lab Results  Component Value Date   HGBA1C 5.3 02/06/2019     Assessment/Plan 1. CVA without late effect -hx of CVA, stressed importance of proper glycemic control, blood pressure and cholesterol.  - pravastatin (PRAVACHOL) 40 MG tablet; TAKE 1 TABLET BY MOUTH EVERYDAY AT BEDTIME  Dispense: 90 tablet; Refill: 1  2. Anxiety and depression Controlled on current medication - sertraline (ZOLOFT) 50 MG tablet; Take 1 tablet (50 mg total) by mouth daily.  Dispense: 90 tablet; Refill: 1 - traZODone (DESYREL) 100 MG tablet; Take 1 tablet (100 mg total) by mouth at bedtime. For sleep  Dispense: 90 tablet; Refill: 1  3. Insomnia, unspecified type Stable on  traZODone (DESYREL) 100 MG tablet; Take 1 tablet (100 mg total) by mouth at bedtime. For sleep  Dispense: 90 tablet; Refill: 1  4. Essential hypertension -controlled on without medication at this time.   5. Anemia due to stage 5 chronic kidney disease, not on chronic dialysis ([place]) hgb remains stable at at goal at this time.  6. Hyperlipidemia with target LDL less than 70 -stable on statin  7. Tobacco abuse -has cut back, encouraged cessation.   8.  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   9. Arthritis Ongoing to hands. Using muscle rubs which have been effective.   Next appt: 6 months  Sabian Kuba K. [place], [facility] [phone]

## 2019-02-22 DIAGNOSIS — E1129 Type 2 diabetes mellitus with other diabetic kidney complication: Secondary | ICD-10-CM | POA: Diagnosis not present

## 2019-02-22 DIAGNOSIS — N2581 Secondary hyperparathyroidism of renal origin: Secondary | ICD-10-CM | POA: Diagnosis not present

## 2019-02-22 DIAGNOSIS — Z992 Dependence on renal dialysis: Secondary | ICD-10-CM | POA: Diagnosis not present

## 2019-02-22 DIAGNOSIS — N186 End stage renal disease: Secondary | ICD-10-CM | POA: Diagnosis not present

## 2019-02-25 DIAGNOSIS — Z992 Dependence on renal dialysis: Secondary | ICD-10-CM | POA: Diagnosis not present

## 2019-02-25 DIAGNOSIS — N186 End stage renal disease: Secondary | ICD-10-CM | POA: Diagnosis not present

## 2019-02-25 DIAGNOSIS — N2581 Secondary hyperparathyroidism of renal origin: Secondary | ICD-10-CM | POA: Diagnosis not present

## 2019-02-27 DIAGNOSIS — N186 End stage renal disease: Secondary | ICD-10-CM | POA: Diagnosis not present

## 2019-02-27 DIAGNOSIS — N2581 Secondary hyperparathyroidism of renal origin: Secondary | ICD-10-CM | POA: Diagnosis not present

## 2019-02-27 DIAGNOSIS — Z992 Dependence on renal dialysis: Secondary | ICD-10-CM | POA: Diagnosis not present

## 2019-03-01 DIAGNOSIS — N186 End stage renal disease: Secondary | ICD-10-CM | POA: Diagnosis not present

## 2019-03-01 DIAGNOSIS — Z992 Dependence on renal dialysis: Secondary | ICD-10-CM | POA: Diagnosis not present

## 2019-03-01 DIAGNOSIS — N2581 Secondary hyperparathyroidism of renal origin: Secondary | ICD-10-CM | POA: Diagnosis not present

## 2019-03-04 DIAGNOSIS — Z992 Dependence on renal dialysis: Secondary | ICD-10-CM | POA: Diagnosis not present

## 2019-03-04 DIAGNOSIS — N2581 Secondary hyperparathyroidism of renal origin: Secondary | ICD-10-CM | POA: Diagnosis not present

## 2019-03-04 DIAGNOSIS — N186 End stage renal disease: Secondary | ICD-10-CM | POA: Diagnosis not present

## 2019-03-06 DIAGNOSIS — N2581 Secondary hyperparathyroidism of renal origin: Secondary | ICD-10-CM | POA: Diagnosis not present

## 2019-03-06 DIAGNOSIS — N186 End stage renal disease: Secondary | ICD-10-CM | POA: Diagnosis not present

## 2019-03-06 DIAGNOSIS — Z992 Dependence on renal dialysis: Secondary | ICD-10-CM | POA: Diagnosis not present

## 2019-03-07 ENCOUNTER — Other Ambulatory Visit: Payer: Self-pay

## 2019-03-07 ENCOUNTER — Ambulatory Visit (INDEPENDENT_AMBULATORY_CARE_PROVIDER_SITE_OTHER): Payer: Medicare HMO | Admitting: Family

## 2019-03-07 ENCOUNTER — Encounter: Payer: Self-pay | Admitting: Family

## 2019-03-07 ENCOUNTER — Ambulatory Visit: Payer: Medicaid Other | Admitting: Nurse Practitioner

## 2019-03-07 VITALS — BP 120/58 | HR 89 | Temp 98.3°F | Resp 20 | Ht 65.0 in | Wt 186.0 lb

## 2019-03-07 DIAGNOSIS — Z0000 Encounter for general adult medical examination without abnormal findings: Secondary | ICD-10-CM | POA: Diagnosis not present

## 2019-03-07 MED ORDER — TETANUS-DIPHTH-ACELL PERTUSSIS 5-2.5-18.5 LF-MCG/0.5 IM SUSP: 0.5000 mL | Freq: Once | INTRAMUSCULAR | 0 refills | Status: AC

## 2019-03-07 NOTE — Progress Notes (Signed)
Subjective:   [name] is a 73 y.o. female who presents for Medicare Annual (Subsequent) preventive examination.  Review of Systems:   Cardiac Risk Factors include: advanced age (>61men, >42 women);hypertension;dyslipidemia;obesity (BMI >30kg/m2);smoking/ tobacco exposure     Objective:     Vitals: BP (!) 120/58   Pulse 89   Temp 98.3 F (36.8 C) (Oral)   Resp 20   Ht 5\' 5"  (1.651 m)   Wt 186 lb (84.4 kg)   SpO2 96%   BMI 30.95 kg/m   Body mass index is 30.95 kg/m.  Advanced Directives 03/07/2019 02/21/2019 11/01/2018 07/30/2018 05/09/2018 01/30/2018 06/25/2017  Does Patient Have a Medical Advance Directive? Yes No Yes No No Yes Yes  Type of Advance Directive Out of facility DNR (pink MOST or yellow form) - Out of facility DNR (pink MOST or yellow form) - - Out of facility DNR (pink MOST or yellow form) Healthcare Power of Attorney  Does patient want to make changes to medical advance directive? No - Patient declined - No - Patient declined - - No - Patient declined -  Would patient like information on creating a medical advance directive? - No - Patient declined - No - Patient declined No - Patient declined - -  Pre-existing out of facility DNR order (yellow form or pink MOST form) - - Yellow form placed in chart (order not valid for inpatient use) - - Yellow form placed in chart (order not valid for inpatient use) -    Tobacco Social History   Tobacco Use  Smoking Status Current Every Day Smoker  . Packs/day: 1.00  . Years: 40.00  . Pack years: 40.00  . Types: Cigarettes  Smokeless Tobacco Never Used  Tobacco Comment   smokes 1 pack of cigerettes a week- off and on     Ready to quit: Not Answered Counseling given: Not Answered Comment: smokes 1 pack of cigerettes a week- off and on   Clinical Intake:  Pre-visit preparation completed: No  Pain : 0-10 Pain Score: 7  Pain Type: Chronic pain Pain Location: Generalized(shoulders,hands ,knees) Pain  Orientation: Other (Comment)(both shoulders) Pain Radiating Towards: No Pain Descriptors / Indicators: Aching Pain Onset: Other (comment)(several years) Pain Frequency: Intermittent Pain Relieving Factors: Tylenol Effect of Pain on Daily Activities: No  Pain Relieving Factors: Tylenol  BMI - recorded: 30.95 Nutritional Status: BMI > 30  Obese Nutritional Risks: None Diabetes: No  How often do you need to have someone help you when you read instructions, pamphlets, or other written materials from your doctor or pharmacy?: 3 - Sometimes What is the last grade level you completed in school?: 2 years of college  Interpreter Needed?: No  Information entered by :: Dinah Ngetich FNP-C  Past Medical History:  Diagnosis Date  . Anemia   . Anxiety   . Arthritis    lower back and knees   . Asthma    childhood  . Barrett's esophagus   . Chronic kidney disease   . Coagulation defect ([place])   . Depression   . Diabetes mellitus   . Dysphagia   . Eczema   . End stage renal disease ([place])   . Fluid overload, unspecified   . History of herpes zoster 02/2010   Recovered fully after period of acute herpetic neuralgia tx w/ gabapentin.   Marland Kitchen Hx MRSA infection 2009  . Hypercalcemia   . Hyperlipidemia   . Hypertension   . Moderate protein-calorie malnutrition ([place])   . Neuromuscular  disorder ([place])    chronic pain  . Personal history of colonic polyps 10/17/2010   hyperplastic   Past Surgical History:  Procedure Laterality Date  . ABDOMINAL HYSTERECTOMY     unclear when  . AV FISTULA PLACEMENT Left 02/13/2017   Procedure: ARTERIOVENOUS (AV) GORE-TEX STRETCH GRAFT INSERTION INTO LEFT ARM;  Surgeon: Elam Dutch, MD;  Location: [facility];  Service: Vascular;  Laterality: Left;  . CATARACT EXTRACTION Bilateral   . thigh surg     left side due to MRSA   Family History  Problem Relation Age of Onset  . Hypertension Father   . Kidney disease Father   . Diabetes Brother   . Colon  cancer Neg Hx   . CAD Neg Hx    Social History   Socioeconomic History  . Marital status: Divorced    Spouse name: Not on file  . Number of children: 0  . Years of education: Not on file  . Highest education level: Not on file  Occupational History  . Occupation: Retired    Fish farm manager: SELF-EMPLOYED  Social Needs  . Financial resource strain: Not hard at all  . Food insecurity    Worry: Never true    Inability: Never true  . Transportation needs    Medical: No    Non-medical: No  Tobacco Use  . Smoking status: Current Every Day Smoker    Packs/day: 1.00    Years: 40.00    Pack years: 40.00    Types: Cigarettes  . Smokeless tobacco: Never Used  . Tobacco comment: smokes 1 pack of cigerettes a week- off and on  Substance and Sexual Activity  . Alcohol use: Yes    Alcohol/week: 2.0 standard drinks    Types: 2 Standard drinks or equivalent per week    Comment: wine and beer- occasionally   . Drug use: No  . Sexual activity: Never  Lifestyle  . Physical activity    Days per week: 7 days    Minutes per session: 30 min  . Stress: To some extent  Relationships  . Social connections    Talks on phone: More than three times a week    Gets together: More than three times a week    Attends religious service: Never    Active member of club or organization: No    Attends meetings of clubs or organizations: Never    Relationship status: Divorced  Other Topics Concern  . Not on file  Social History Narrative   Financial assistance approved for 100% discount at [facility] and has [facility] card per Bonna Gains   02/10/2010      3 caffeine drinks daily     Outpatient Encounter Medications as of 03/07/2019  Medication Sig  . ALPRAZolam (XANAX) 0.25 MG tablet Take 1 tablet (0.25 mg total) by mouth daily as needed for anxiety.  Marland Kitchen aspirin 81 MG chewable tablet Chew 1 tablet (81 mg total) by mouth daily.  . calcium acetate (PHOSLO) 667 MG capsule Take by mouth 3 (three) times daily with  meals.  . cinacalcet (SENSIPAR) 30 MG tablet Take 30 mg by mouth daily.  . clopidogrel (PLAVIX) 75 MG tablet TAKE 1 TABLET (75 MG TOTAL) BY MOUTH DAILY. DO NOT USE BLISTER PACKS  . ezetimibe (ZETIA) 10 MG tablet TAKE 1 TABLET BY MOUTH EVERY DAY  . pravastatin (PRAVACHOL) 40 MG tablet TAKE 1 TABLET BY MOUTH EVERYDAY AT BEDTIME  . sertraline (ZOLOFT) 50 MG tablet Take 1 tablet (50  mg total) by mouth daily.  . sevelamer carbonate (RENVELA) 2.4 g PACK 2.4 g 3 (three) times daily with meals.  . Tdap (BOOSTRIX) 5-2.5-18.5 LF-MCG/0.5 injection Inject 0.5 mLs into the muscle once for 1 dose.  . traZODone (DESYREL) 100 MG tablet Take 1 tablet (100 mg total) by mouth at bedtime. For sleep  . triamcinolone cream (KENALOG) 0.1 % Apply 1 application topically 3 (three) times daily as needed (rash).  . [DISCONTINUED] Tdap (BOOSTRIX) 5-2.5-18.5 LF-MCG/0.5 injection Inject 0.5 mLs into the muscle once.   No facility-administered encounter medications on file as of 03/07/2019.     Activities of Daily Living In your present state of health, do you have any difficulty performing the following activities: 03/07/2019  Hearing? N  Vision? N  Difficulty concentrating or making decisions? Y  Comment Remembering and making decision  Walking or climbing stairs? Y  Comment Knee pains  Dressing or bathing? N  Doing errands, shopping? Y  Comment brother drives  Conservation officer, nature and eating ? Y  Comment brother cooks  Using the Toilet? N  In the past six months, have you accidently leaked urine? N  Do you have problems with loss of bowel control? N  Managing your Medications? Y  Comment brother assist  Managing your Finances? N  Housekeeping or managing your Housekeeping? Y  Comment brother assist  Some recent data might be hidden    Patient Care Team: [name], NP as PCP - General (Nurse Practitioner) [name], MD as Consulting Physician (Ophthalmology) [name], MD as Consulting  Physician (Nephrology) [name], NP as Nurse Practitioner (Nurse Practitioner)    Assessment:   This is a routine wellness examination for [place].  Exercise Activities and Dietary recommendations Current Exercise Habits: Home exercise routine, Type of exercise: walking, Time (Minutes): 30, Frequency (Times/Week): 6, Weekly Exercise (Minutes/Week): 180, Intensity: Moderate, Exercise limited by: Other - see comments(Arthritic pain on the knees)  Goals    . Blood Pressure < 140/90    . HEMOGLOBIN A1C < 7.0    . Increase water intake- especially when blood sugars are high    . LDL CALC < 100    . Weight (lb) < 200 lb (90.7 kg)     I would like to keep my weight down.        Fall Risk Fall Risk  03/07/2019 11/01/2018 09/03/2018 08/12/2018 05/17/2018  Falls in the past year? 0 0 0 1 1  Number falls in past yr: - 0 0 1 0  Injury with Fall? - 0 0 1 0  Comment - - - Cut tongue  -  Risk Factor Category  - - - - -  Risk for fall due to : - - - History of fall(s);Impaired balance/gait -  Risk for fall due to: Comment - - - - -  Follow up - - - - -   Is the patient's home free of loose throw rugs in walkways, pet beds, electrical cords, etc?   yes      Grab bars in the bathroom? yes      Handrails on the stairs?   no      Adequate lighting?   yes  Depression Screen PHQ 2/9 Scores 01/30/2018 01/08/2017 06/07/2016 01/06/2016  PHQ - 2 Score 1 2 0 3  PHQ- 9 Score - 5 - 8     Cognitive Function MMSE - Mini Mental State Exam 03/07/2019 01/30/2018 01/08/2017 12/13/2016 05/23/2016  Orientation to time 5 5  5 5 5   Orientation to Place 5 5 5 5 5   Registration 3 3 3 3 3   Attention/ Calculation 1 5 5 5 5   Recall 2 2 1 1 2   Language- name 2 objects 2 2 2 2 2   Language- repeat 1 1 1 1 1   Language- follow 3 step command 3 3 3 3 3   Language- read & follow direction 1 1 1 1 1   Write a sentence 0 1 1 1 1   Copy design 0 0 0 1 1  Total score 23 28 27 28 29         Immunization History   Administered Date(s) Administered  . Hepatitis B, adult 06/21/2017, 07/19/2017, 08/18/2017  . Influenza Split 02/03/2011, 01/12/2012  . Influenza Whole 01/07/2010  . Influenza, High Dose Seasonal PF 01/08/2017, 01/22/2018  . Influenza,inj,Quad PF,6+ Mos 01/03/2013, 02/12/2014, 01/15/2015, 01/06/2016  . Influenza-Unspecified 01/23/2019  . Pneumococcal Conjugate-13 04/22/2015  . Pneumococcal Polysaccharide-23 01/12/2012  . Td 11/23/2008  . Zoster 04/24/2014  . Zoster Recombinat (Shingrix) 02/13/2018    Qualifies for Shingles Vaccine? Due for 2nd shingrix   Screening Tests Health Maintenance  Topic Date Due  . OPHTHALMOLOGY EXAM  01/05/2017  . TETANUS/TDAP  11/24/2018  . HEMOGLOBIN A1C  05/09/2019  . FOOT EXAM  05/18/2019  . LIPID PANEL  08/15/2019  . MAMMOGRAM  09/17/2020  . COLONOSCOPY  10/16/2020  . INFLUENZA VACCINE  Completed  . DEXA SCAN  Completed  . Hepatitis C Screening  Completed  . PNA vac Low Risk Adult  Completed    Cancer Screenings: Lung: Low Dose CT Chest recommended if Age 2-80 years, 30 pack-year currently smoking OR have quit w/in 15years. Patient does qualify. Breast:  Up to date on Mammogram? Yes   Up to date of Bone Density/Dexa? Yes Colorectal:Up to date   Additional Screenings: Hepatitis C Screening: Completed   Plan:  - Tdap vaccine ordered  - will get second Shingrix at the pharmacy   I have personally reviewed and noted the following in the patient's chart:   . Medical and social history . Use of alcohol, tobacco or illicit drugs  . Current medications and supplements . Functional ability and status . Nutritional status . Physical activity . Advanced directives . List of other physicians . Hospitalizations, surgeries, and ER visits in previous 12 months . Vitals . Screenings to include cognitive, depression, and falls . Referrals and appointments  In addition, I have reviewed and discussed with patient certain preventive protocols,  quality metrics, and best practice recommendations. A written personalized care plan for preventive services as well as general preventive health recommendations were provided to patient.   Sandrea Hughs, NP  03/07/2019

## 2019-03-07 NOTE — Patient Instructions (Signed)
[name] , Thank you for taking time to come for your Medicare Wellness Visit. I appreciate your ongoing commitment to your health goals. Please review the following plan we discussed and let me know if I can assist you in the future.   Screening recommendations/referrals: Colonoscopy: Up to date due 10/16/2020 Mammogram: Up to date  Bone Density : Up to date  Recommended yearly ophthalmology/optometry visit for glaucoma screening and checkup Recommended yearly dental visit for hygiene and checkup  Vaccinations: Influenza vaccine : Up to date  Pneumococcal vaccine : Up to date  Tdap vaccine: Please get vaccine at the pharmacy  Shingles vaccine : Please get second dose of vaccine at the pharmacy    Advanced directives: Yes   Conditions/risks identified: Advance age female > 19 yrs,Hypertension,Dyslipidemia,Obesity,smoker   Next appointment: 1 year   Preventive Care 40 Years and Older, Female Preventive care refers to lifestyle choices and visits with your health care provider that can promote health and wellness. What does preventive care include?  A yearly physical exam. This is also called an annual well check.  Dental exams once or twice a year.  Routine eye exams. Ask your health care provider how often you should have your eyes checked.  Personal lifestyle choices, including:  Daily care of your teeth and gums.  Regular physical activity.  Eating a healthy diet.  Avoiding tobacco and drug use.  Limiting alcohol use.  Practicing safe sex.  Taking low-dose aspirin every day.  Taking vitamin and mineral supplements as recommended by your health care provider. What happens during an annual well check? The services and screenings done by your health care provider during your annual well check will depend on your age, overall health, lifestyle risk factors, and family history of disease. Counseling  Your health care provider may ask you questions about your:   Alcohol use.  Tobacco use.  Drug use.  Emotional well-being.  Home and relationship well-being.  Sexual activity.  Eating habits.  History of falls.  Memory and ability to understand (cognition).  Work and work Statistician.  Reproductive health. Screening  You may have the following tests or measurements:  Height, weight, and BMI.  Blood pressure.  Lipid and cholesterol levels. These may be checked every 5 years, or more frequently if you are over 50 years old.  Skin check.  Lung cancer screening. You may have this screening every year starting at age 3 if you have a 30-pack-year history of smoking and currently smoke or have quit within the past 15 years.  Fecal occult blood test (FOBT) of the stool. You may have this test every year starting at age 60.  Flexible sigmoidoscopy or colonoscopy. You may have a sigmoidoscopy every 5 years or a colonoscopy every 10 years starting at age 76.  Hepatitis C blood test.  Hepatitis B blood test.  Sexually transmitted disease (STD) testing.  Diabetes screening. This is done by checking your blood sugar (glucose) after you have not eaten for a while (fasting). You may have this done every 1-3 years.  Bone density scan. This is done to screen for osteoporosis. You may have this done starting at age 81.  Mammogram. This may be done every 1-2 years. Talk to your health care provider about how often you should have regular mammograms. Talk with your health care provider about your test results, treatment options, and if necessary, the need for more tests. Vaccines  Your health care provider may recommend certain vaccines, such as:  Influenza vaccine. This is recommended every year.  Tetanus, diphtheria, and acellular pertussis (Tdap, Td) vaccine. You may need a Td booster every 10 years.  Zoster vaccine. You may need this after age 33.  Pneumococcal 13-valent conjugate (PCV13) vaccine. One dose is recommended after age 64.   Pneumococcal polysaccharide (PPSV23) vaccine. One dose is recommended after age 46. Talk to your health care provider about which screenings and vaccines you need and how often you need them. This information is not intended to replace advice given to you by your health care provider. Make sure you discuss any questions you have with your health care provider. Document Released: 05/07/2015 Document Revised: 12/29/2015 Document Reviewed: 02/09/2015 Elsevier Interactive Patient Education  2017 [place] Prevention in the Home Falls can cause injuries. They can happen to people of all ages. There are many things you can do to make your home safe and to help prevent falls. What can I do on the outside of my home?  Regularly fix the edges of walkways and driveways and fix any cracks.  Remove anything that might make you trip as you walk through a door, such as a raised step or threshold.  Trim any bushes or trees on the path to your home.  Use bright outdoor lighting.  Clear any walking paths of anything that might make someone trip, such as rocks or tools.  Regularly check to see if handrails are loose or broken. Make sure that both sides of any steps have handrails.  Any raised decks and porches should have guardrails on the edges.  Have any leaves, snow, or ice cleared regularly.  Use sand or salt on walking paths during winter.  Clean up any spills in your garage right away. This includes oil or grease spills. What can I do in the bathroom?  Use night lights.  Install grab bars by the toilet and in the tub and shower. Do not use towel bars as grab bars.  Use non-skid mats or decals in the tub or shower.  If you need to sit down in the shower, use a plastic, non-slip stool.  Keep the floor dry. Clean up any water that spills on the floor as soon as it happens.  Remove soap buildup in the tub or shower regularly.  Attach bath mats securely with double-sided  non-slip rug tape.  Do not have throw rugs and other things on the floor that can make you trip. What can I do in the bedroom?  Use night lights.  Make sure that you have a light by your bed that is easy to reach.  Do not use any sheets or blankets that are too big for your bed. They should not hang down onto the floor.  Have a firm chair that has side arms. You can use this for support while you get dressed.  Do not have throw rugs and other things on the floor that can make you trip. What can I do in the kitchen?  Clean up any spills right away.  Avoid walking on wet floors.  Keep items that you use a lot in easy-to-reach places.  If you need to reach something above you, use a strong step stool that has a grab bar.  Keep electrical cords out of the way.  Do not use floor polish or wax that makes floors slippery. If you must use wax, use non-skid floor wax.  Do not have throw rugs and other things on the floor that  can make you trip. What can I do with my stairs?  Do not leave any items on the stairs.  Make sure that there are handrails on both sides of the stairs and use them. Fix handrails that are broken or loose. Make sure that handrails are as long as the stairways.  Check any carpeting to make sure that it is firmly attached to the stairs. Fix any carpet that is loose or worn.  Avoid having throw rugs at the top or bottom of the stairs. If you do have throw rugs, attach them to the floor with carpet tape.  Make sure that you have a light switch at the top of the stairs and the bottom of the stairs. If you do not have them, ask someone to add them for you. What else can I do to help prevent falls?  Wear shoes that:  Do not have high heels.  Have rubber bottoms.  Are comfortable and fit you well.  Are closed at the toe. Do not wear sandals.  If you use a stepladder:  Make sure that it is fully opened. Do not climb a closed stepladder.  Make sure that both  sides of the stepladder are locked into place.  Ask someone to hold it for you, if possible.  Clearly mark and make sure that you can see:  Any grab bars or handrails.  First and last steps.  Where the edge of each step is.  Use tools that help you move around (mobility aids) if they are needed. These include:  Canes.  Walkers.  Scooters.  Crutches.  Turn on the lights when you go into a dark area. Replace any light bulbs as soon as they burn out.  Set up your furniture so you have a clear path. Avoid moving your furniture around.  If any of your floors are uneven, fix them.  If there are any pets around you, be aware of where they are.  Review your medicines with your doctor. Some medicines can make you feel dizzy. This can increase your chance of falling. Ask your doctor what other things that you can do to help prevent falls. This information is not intended to replace advice given to you by your health care provider. Make sure you discuss any questions you have with your health care provider. Document Released: 02/04/2009 Document Revised: 09/16/2015 Document Reviewed: 05/15/2014 Elsevier Interactive Patient Education  2017 [employer].

## 2019-03-08 DIAGNOSIS — N2581 Secondary hyperparathyroidism of renal origin: Secondary | ICD-10-CM | POA: Diagnosis not present

## 2019-03-08 DIAGNOSIS — Z992 Dependence on renal dialysis: Secondary | ICD-10-CM | POA: Diagnosis not present

## 2019-03-08 DIAGNOSIS — N186 End stage renal disease: Secondary | ICD-10-CM | POA: Diagnosis not present

## 2019-03-11 DIAGNOSIS — N2581 Secondary hyperparathyroidism of renal origin: Secondary | ICD-10-CM | POA: Diagnosis not present

## 2019-03-11 DIAGNOSIS — N186 End stage renal disease: Secondary | ICD-10-CM | POA: Diagnosis not present

## 2019-03-11 DIAGNOSIS — Z992 Dependence on renal dialysis: Secondary | ICD-10-CM | POA: Diagnosis not present

## 2019-03-12 ENCOUNTER — Other Ambulatory Visit: Payer: Self-pay | Admitting: Nurse Practitioner

## 2019-03-12 DIAGNOSIS — I639 Cerebral infarction, unspecified: Secondary | ICD-10-CM

## 2019-03-12 DIAGNOSIS — F418 Other specified anxiety disorders: Secondary | ICD-10-CM

## 2019-03-12 DIAGNOSIS — L309 Dermatitis, unspecified: Secondary | ICD-10-CM

## 2019-03-13 ENCOUNTER — Telehealth: Payer: Self-pay

## 2019-03-13 DIAGNOSIS — N2581 Secondary hyperparathyroidism of renal origin: Secondary | ICD-10-CM | POA: Diagnosis not present

## 2019-03-13 DIAGNOSIS — Z992 Dependence on renal dialysis: Secondary | ICD-10-CM | POA: Diagnosis not present

## 2019-03-13 DIAGNOSIS — N186 End stage renal disease: Secondary | ICD-10-CM | POA: Diagnosis not present

## 2019-03-13 NOTE — Telephone Encounter (Signed)
Patient called to request a refill on one of her medications and when I checked the medication she asked for it was not on her med list I informed her of this and she said she couldn't get it refilled because she got  it from the [facility] store I told her we could not refill a medication that was not on her med list she said she got the medication from [facility] I told her she would have to contact them for the refill I spoke to [place] at [place] request and explained the phone call to them and Rodena Piety said I should either ask them if they would like to schedule an appointment with her provider to discuss the medication refill or to instruct them to go back to to provider that prescribed the medication

## 2019-03-14 ENCOUNTER — Encounter: Payer: Self-pay | Admitting: Nurse Practitioner

## 2019-03-15 DIAGNOSIS — Z992 Dependence on renal dialysis: Secondary | ICD-10-CM | POA: Diagnosis not present

## 2019-03-15 DIAGNOSIS — N186 End stage renal disease: Secondary | ICD-10-CM | POA: Diagnosis not present

## 2019-03-15 DIAGNOSIS — N2581 Secondary hyperparathyroidism of renal origin: Secondary | ICD-10-CM | POA: Diagnosis not present

## 2019-03-17 DIAGNOSIS — N2581 Secondary hyperparathyroidism of renal origin: Secondary | ICD-10-CM | POA: Diagnosis not present

## 2019-03-17 DIAGNOSIS — N186 End stage renal disease: Secondary | ICD-10-CM | POA: Diagnosis not present

## 2019-03-17 DIAGNOSIS — Z992 Dependence on renal dialysis: Secondary | ICD-10-CM | POA: Diagnosis not present

## 2019-03-19 DIAGNOSIS — N186 End stage renal disease: Secondary | ICD-10-CM | POA: Diagnosis not present

## 2019-03-19 DIAGNOSIS — N2581 Secondary hyperparathyroidism of renal origin: Secondary | ICD-10-CM | POA: Diagnosis not present

## 2019-03-19 DIAGNOSIS — Z992 Dependence on renal dialysis: Secondary | ICD-10-CM | POA: Diagnosis not present

## 2019-03-22 DIAGNOSIS — N2581 Secondary hyperparathyroidism of renal origin: Secondary | ICD-10-CM | POA: Diagnosis not present

## 2019-03-22 DIAGNOSIS — N186 End stage renal disease: Secondary | ICD-10-CM | POA: Diagnosis not present

## 2019-03-22 DIAGNOSIS — Z992 Dependence on renal dialysis: Secondary | ICD-10-CM | POA: Diagnosis not present

## 2019-03-24 DIAGNOSIS — N186 End stage renal disease: Secondary | ICD-10-CM | POA: Diagnosis not present

## 2019-03-24 DIAGNOSIS — Z992 Dependence on renal dialysis: Secondary | ICD-10-CM | POA: Diagnosis not present

## 2019-03-24 DIAGNOSIS — E1129 Type 2 diabetes mellitus with other diabetic kidney complication: Secondary | ICD-10-CM | POA: Diagnosis not present

## 2019-03-25 DIAGNOSIS — Z992 Dependence on renal dialysis: Secondary | ICD-10-CM | POA: Diagnosis not present

## 2019-03-25 DIAGNOSIS — N2581 Secondary hyperparathyroidism of renal origin: Secondary | ICD-10-CM | POA: Diagnosis not present

## 2019-03-25 DIAGNOSIS — N186 End stage renal disease: Secondary | ICD-10-CM | POA: Diagnosis not present

## 2019-03-27 DIAGNOSIS — Z992 Dependence on renal dialysis: Secondary | ICD-10-CM | POA: Diagnosis not present

## 2019-03-27 DIAGNOSIS — N186 End stage renal disease: Secondary | ICD-10-CM | POA: Diagnosis not present

## 2019-03-27 DIAGNOSIS — N2581 Secondary hyperparathyroidism of renal origin: Secondary | ICD-10-CM | POA: Diagnosis not present

## 2019-03-29 DIAGNOSIS — Z992 Dependence on renal dialysis: Secondary | ICD-10-CM | POA: Diagnosis not present

## 2019-03-29 DIAGNOSIS — N2581 Secondary hyperparathyroidism of renal origin: Secondary | ICD-10-CM | POA: Diagnosis not present

## 2019-03-29 DIAGNOSIS — N186 End stage renal disease: Secondary | ICD-10-CM | POA: Diagnosis not present

## 2019-04-01 DIAGNOSIS — N2581 Secondary hyperparathyroidism of renal origin: Secondary | ICD-10-CM | POA: Diagnosis not present

## 2019-04-01 DIAGNOSIS — N186 End stage renal disease: Secondary | ICD-10-CM | POA: Diagnosis not present

## 2019-04-01 DIAGNOSIS — Z992 Dependence on renal dialysis: Secondary | ICD-10-CM | POA: Diagnosis not present

## 2019-04-03 DIAGNOSIS — Z992 Dependence on renal dialysis: Secondary | ICD-10-CM | POA: Diagnosis not present

## 2019-04-03 DIAGNOSIS — N186 End stage renal disease: Secondary | ICD-10-CM | POA: Diagnosis not present

## 2019-04-03 DIAGNOSIS — N2581 Secondary hyperparathyroidism of renal origin: Secondary | ICD-10-CM | POA: Diagnosis not present

## 2019-04-05 DIAGNOSIS — N186 End stage renal disease: Secondary | ICD-10-CM | POA: Diagnosis not present

## 2019-04-05 DIAGNOSIS — N2581 Secondary hyperparathyroidism of renal origin: Secondary | ICD-10-CM | POA: Diagnosis not present

## 2019-04-05 DIAGNOSIS — Z992 Dependence on renal dialysis: Secondary | ICD-10-CM | POA: Diagnosis not present

## 2019-04-08 DIAGNOSIS — Z992 Dependence on renal dialysis: Secondary | ICD-10-CM | POA: Diagnosis not present

## 2019-04-08 DIAGNOSIS — N186 End stage renal disease: Secondary | ICD-10-CM | POA: Diagnosis not present

## 2019-04-08 DIAGNOSIS — N2581 Secondary hyperparathyroidism of renal origin: Secondary | ICD-10-CM | POA: Diagnosis not present

## 2019-04-10 DIAGNOSIS — N2581 Secondary hyperparathyroidism of renal origin: Secondary | ICD-10-CM | POA: Diagnosis not present

## 2019-04-10 DIAGNOSIS — Z992 Dependence on renal dialysis: Secondary | ICD-10-CM | POA: Diagnosis not present

## 2019-04-10 DIAGNOSIS — N186 End stage renal disease: Secondary | ICD-10-CM | POA: Diagnosis not present

## 2019-04-12 DIAGNOSIS — N2581 Secondary hyperparathyroidism of renal origin: Secondary | ICD-10-CM | POA: Diagnosis not present

## 2019-04-12 DIAGNOSIS — Z992 Dependence on renal dialysis: Secondary | ICD-10-CM | POA: Diagnosis not present

## 2019-04-12 DIAGNOSIS — N186 End stage renal disease: Secondary | ICD-10-CM | POA: Diagnosis not present

## 2019-04-14 ENCOUNTER — Other Ambulatory Visit: Payer: Self-pay | Admitting: Nurse Practitioner

## 2019-04-14 DIAGNOSIS — F418 Other specified anxiety disorders: Secondary | ICD-10-CM

## 2019-04-15 DIAGNOSIS — N2581 Secondary hyperparathyroidism of renal origin: Secondary | ICD-10-CM | POA: Diagnosis not present

## 2019-04-15 DIAGNOSIS — Z992 Dependence on renal dialysis: Secondary | ICD-10-CM | POA: Diagnosis not present

## 2019-04-15 DIAGNOSIS — N186 End stage renal disease: Secondary | ICD-10-CM | POA: Diagnosis not present

## 2019-04-17 DIAGNOSIS — Z992 Dependence on renal dialysis: Secondary | ICD-10-CM | POA: Diagnosis not present

## 2019-04-17 DIAGNOSIS — N186 End stage renal disease: Secondary | ICD-10-CM | POA: Diagnosis not present

## 2019-04-17 DIAGNOSIS — N2581 Secondary hyperparathyroidism of renal origin: Secondary | ICD-10-CM | POA: Diagnosis not present

## 2019-04-20 DIAGNOSIS — Z992 Dependence on renal dialysis: Secondary | ICD-10-CM | POA: Diagnosis not present

## 2019-04-20 DIAGNOSIS — N2581 Secondary hyperparathyroidism of renal origin: Secondary | ICD-10-CM | POA: Diagnosis not present

## 2019-04-20 DIAGNOSIS — N186 End stage renal disease: Secondary | ICD-10-CM | POA: Diagnosis not present

## 2019-04-22 DIAGNOSIS — N2581 Secondary hyperparathyroidism of renal origin: Secondary | ICD-10-CM | POA: Diagnosis not present

## 2019-04-22 DIAGNOSIS — Z992 Dependence on renal dialysis: Secondary | ICD-10-CM | POA: Diagnosis not present

## 2019-04-22 DIAGNOSIS — N186 End stage renal disease: Secondary | ICD-10-CM | POA: Diagnosis not present

## 2019-04-24 DIAGNOSIS — N186 End stage renal disease: Secondary | ICD-10-CM | POA: Diagnosis not present

## 2019-04-24 DIAGNOSIS — N2581 Secondary hyperparathyroidism of renal origin: Secondary | ICD-10-CM | POA: Diagnosis not present

## 2019-04-24 DIAGNOSIS — E1129 Type 2 diabetes mellitus with other diabetic kidney complication: Secondary | ICD-10-CM | POA: Diagnosis not present

## 2019-04-24 DIAGNOSIS — Z992 Dependence on renal dialysis: Secondary | ICD-10-CM | POA: Diagnosis not present

## 2019-06-24 DIAGNOSIS — D631 Anemia in chronic kidney disease: Secondary | ICD-10-CM | POA: Diagnosis not present

## 2019-06-24 DIAGNOSIS — D689 Coagulation defect, unspecified: Secondary | ICD-10-CM | POA: Diagnosis not present

## 2019-06-24 DIAGNOSIS — N2581 Secondary hyperparathyroidism of renal origin: Secondary | ICD-10-CM | POA: Diagnosis not present

## 2019-06-24 DIAGNOSIS — Z992 Dependence on renal dialysis: Secondary | ICD-10-CM | POA: Diagnosis not present

## 2019-06-24 DIAGNOSIS — E1122 Type 2 diabetes mellitus with diabetic chronic kidney disease: Secondary | ICD-10-CM | POA: Diagnosis not present

## 2019-06-24 DIAGNOSIS — N186 End stage renal disease: Secondary | ICD-10-CM | POA: Diagnosis not present

## 2019-06-26 DIAGNOSIS — N2581 Secondary hyperparathyroidism of renal origin: Secondary | ICD-10-CM | POA: Diagnosis not present

## 2019-06-26 DIAGNOSIS — N186 End stage renal disease: Secondary | ICD-10-CM | POA: Diagnosis not present

## 2019-06-26 DIAGNOSIS — Z992 Dependence on renal dialysis: Secondary | ICD-10-CM | POA: Diagnosis not present

## 2019-06-26 DIAGNOSIS — D631 Anemia in chronic kidney disease: Secondary | ICD-10-CM | POA: Diagnosis not present

## 2019-06-26 DIAGNOSIS — E1122 Type 2 diabetes mellitus with diabetic chronic kidney disease: Secondary | ICD-10-CM | POA: Diagnosis not present

## 2019-06-26 DIAGNOSIS — D689 Coagulation defect, unspecified: Secondary | ICD-10-CM | POA: Diagnosis not present

## 2019-06-28 DIAGNOSIS — N2581 Secondary hyperparathyroidism of renal origin: Secondary | ICD-10-CM | POA: Diagnosis not present

## 2019-06-28 DIAGNOSIS — Z992 Dependence on renal dialysis: Secondary | ICD-10-CM | POA: Diagnosis not present

## 2019-06-28 DIAGNOSIS — D631 Anemia in chronic kidney disease: Secondary | ICD-10-CM | POA: Diagnosis not present

## 2019-06-28 DIAGNOSIS — D689 Coagulation defect, unspecified: Secondary | ICD-10-CM | POA: Diagnosis not present

## 2019-06-28 DIAGNOSIS — N186 End stage renal disease: Secondary | ICD-10-CM | POA: Diagnosis not present

## 2019-06-28 DIAGNOSIS — E1122 Type 2 diabetes mellitus with diabetic chronic kidney disease: Secondary | ICD-10-CM | POA: Diagnosis not present

## 2019-07-01 DIAGNOSIS — D689 Coagulation defect, unspecified: Secondary | ICD-10-CM | POA: Diagnosis not present

## 2019-07-01 DIAGNOSIS — E1122 Type 2 diabetes mellitus with diabetic chronic kidney disease: Secondary | ICD-10-CM | POA: Diagnosis not present

## 2019-07-01 DIAGNOSIS — Z992 Dependence on renal dialysis: Secondary | ICD-10-CM | POA: Diagnosis not present

## 2019-07-01 DIAGNOSIS — N2581 Secondary hyperparathyroidism of renal origin: Secondary | ICD-10-CM | POA: Diagnosis not present

## 2019-07-01 DIAGNOSIS — D631 Anemia in chronic kidney disease: Secondary | ICD-10-CM | POA: Diagnosis not present

## 2019-07-01 DIAGNOSIS — N186 End stage renal disease: Secondary | ICD-10-CM | POA: Diagnosis not present

## 2019-07-03 DIAGNOSIS — E1122 Type 2 diabetes mellitus with diabetic chronic kidney disease: Secondary | ICD-10-CM | POA: Diagnosis not present

## 2019-07-03 DIAGNOSIS — D689 Coagulation defect, unspecified: Secondary | ICD-10-CM | POA: Diagnosis not present

## 2019-07-03 DIAGNOSIS — Z992 Dependence on renal dialysis: Secondary | ICD-10-CM | POA: Diagnosis not present

## 2019-07-03 DIAGNOSIS — D631 Anemia in chronic kidney disease: Secondary | ICD-10-CM | POA: Diagnosis not present

## 2019-07-03 DIAGNOSIS — N2581 Secondary hyperparathyroidism of renal origin: Secondary | ICD-10-CM | POA: Diagnosis not present

## 2019-07-03 DIAGNOSIS — N186 End stage renal disease: Secondary | ICD-10-CM | POA: Diagnosis not present

## 2019-07-05 DIAGNOSIS — Z992 Dependence on renal dialysis: Secondary | ICD-10-CM | POA: Diagnosis not present

## 2019-07-05 DIAGNOSIS — D689 Coagulation defect, unspecified: Secondary | ICD-10-CM | POA: Diagnosis not present

## 2019-07-05 DIAGNOSIS — D631 Anemia in chronic kidney disease: Secondary | ICD-10-CM | POA: Diagnosis not present

## 2019-07-05 DIAGNOSIS — N186 End stage renal disease: Secondary | ICD-10-CM | POA: Diagnosis not present

## 2019-07-05 DIAGNOSIS — N2581 Secondary hyperparathyroidism of renal origin: Secondary | ICD-10-CM | POA: Diagnosis not present

## 2019-07-05 DIAGNOSIS — E1122 Type 2 diabetes mellitus with diabetic chronic kidney disease: Secondary | ICD-10-CM | POA: Diagnosis not present

## 2019-07-08 DIAGNOSIS — N2581 Secondary hyperparathyroidism of renal origin: Secondary | ICD-10-CM | POA: Diagnosis not present

## 2019-07-08 DIAGNOSIS — N186 End stage renal disease: Secondary | ICD-10-CM | POA: Diagnosis not present

## 2019-07-08 DIAGNOSIS — D631 Anemia in chronic kidney disease: Secondary | ICD-10-CM | POA: Diagnosis not present

## 2019-07-08 DIAGNOSIS — E1122 Type 2 diabetes mellitus with diabetic chronic kidney disease: Secondary | ICD-10-CM | POA: Diagnosis not present

## 2019-07-08 DIAGNOSIS — Z992 Dependence on renal dialysis: Secondary | ICD-10-CM | POA: Diagnosis not present

## 2019-07-08 DIAGNOSIS — D689 Coagulation defect, unspecified: Secondary | ICD-10-CM | POA: Diagnosis not present

## 2019-07-10 DIAGNOSIS — D631 Anemia in chronic kidney disease: Secondary | ICD-10-CM | POA: Diagnosis not present

## 2019-07-10 DIAGNOSIS — D689 Coagulation defect, unspecified: Secondary | ICD-10-CM | POA: Diagnosis not present

## 2019-07-10 DIAGNOSIS — E1122 Type 2 diabetes mellitus with diabetic chronic kidney disease: Secondary | ICD-10-CM | POA: Diagnosis not present

## 2019-07-10 DIAGNOSIS — N2581 Secondary hyperparathyroidism of renal origin: Secondary | ICD-10-CM | POA: Diagnosis not present

## 2019-07-10 DIAGNOSIS — Z992 Dependence on renal dialysis: Secondary | ICD-10-CM | POA: Diagnosis not present

## 2019-07-10 DIAGNOSIS — N186 End stage renal disease: Secondary | ICD-10-CM | POA: Diagnosis not present

## 2019-07-12 DIAGNOSIS — E1122 Type 2 diabetes mellitus with diabetic chronic kidney disease: Secondary | ICD-10-CM | POA: Diagnosis not present

## 2019-07-12 DIAGNOSIS — Z992 Dependence on renal dialysis: Secondary | ICD-10-CM | POA: Diagnosis not present

## 2019-07-12 DIAGNOSIS — N186 End stage renal disease: Secondary | ICD-10-CM | POA: Diagnosis not present

## 2019-07-12 DIAGNOSIS — D689 Coagulation defect, unspecified: Secondary | ICD-10-CM | POA: Diagnosis not present

## 2019-07-12 DIAGNOSIS — D631 Anemia in chronic kidney disease: Secondary | ICD-10-CM | POA: Diagnosis not present

## 2019-07-12 DIAGNOSIS — N2581 Secondary hyperparathyroidism of renal origin: Secondary | ICD-10-CM | POA: Diagnosis not present

## 2019-07-15 DIAGNOSIS — D631 Anemia in chronic kidney disease: Secondary | ICD-10-CM | POA: Diagnosis not present

## 2019-07-15 DIAGNOSIS — D689 Coagulation defect, unspecified: Secondary | ICD-10-CM | POA: Diagnosis not present

## 2019-07-15 DIAGNOSIS — E1122 Type 2 diabetes mellitus with diabetic chronic kidney disease: Secondary | ICD-10-CM | POA: Diagnosis not present

## 2019-07-15 DIAGNOSIS — N2581 Secondary hyperparathyroidism of renal origin: Secondary | ICD-10-CM | POA: Diagnosis not present

## 2019-07-15 DIAGNOSIS — N186 End stage renal disease: Secondary | ICD-10-CM | POA: Diagnosis not present

## 2019-07-15 DIAGNOSIS — Z992 Dependence on renal dialysis: Secondary | ICD-10-CM | POA: Diagnosis not present

## 2019-07-17 DIAGNOSIS — D689 Coagulation defect, unspecified: Secondary | ICD-10-CM | POA: Diagnosis not present

## 2019-07-17 DIAGNOSIS — N186 End stage renal disease: Secondary | ICD-10-CM | POA: Diagnosis not present

## 2019-07-17 DIAGNOSIS — Z992 Dependence on renal dialysis: Secondary | ICD-10-CM | POA: Diagnosis not present

## 2019-07-17 DIAGNOSIS — N2581 Secondary hyperparathyroidism of renal origin: Secondary | ICD-10-CM | POA: Diagnosis not present

## 2019-07-17 DIAGNOSIS — E1122 Type 2 diabetes mellitus with diabetic chronic kidney disease: Secondary | ICD-10-CM | POA: Diagnosis not present

## 2019-07-17 DIAGNOSIS — D631 Anemia in chronic kidney disease: Secondary | ICD-10-CM | POA: Diagnosis not present

## 2019-07-19 DIAGNOSIS — D689 Coagulation defect, unspecified: Secondary | ICD-10-CM | POA: Diagnosis not present

## 2019-07-19 DIAGNOSIS — D631 Anemia in chronic kidney disease: Secondary | ICD-10-CM | POA: Diagnosis not present

## 2019-07-19 DIAGNOSIS — N2581 Secondary hyperparathyroidism of renal origin: Secondary | ICD-10-CM | POA: Diagnosis not present

## 2019-07-19 DIAGNOSIS — Z992 Dependence on renal dialysis: Secondary | ICD-10-CM | POA: Diagnosis not present

## 2019-07-19 DIAGNOSIS — N186 End stage renal disease: Secondary | ICD-10-CM | POA: Diagnosis not present

## 2019-07-19 DIAGNOSIS — E1122 Type 2 diabetes mellitus with diabetic chronic kidney disease: Secondary | ICD-10-CM | POA: Diagnosis not present

## 2019-07-22 DIAGNOSIS — N2581 Secondary hyperparathyroidism of renal origin: Secondary | ICD-10-CM | POA: Diagnosis not present

## 2019-07-22 DIAGNOSIS — D631 Anemia in chronic kidney disease: Secondary | ICD-10-CM | POA: Diagnosis not present

## 2019-07-22 DIAGNOSIS — E1122 Type 2 diabetes mellitus with diabetic chronic kidney disease: Secondary | ICD-10-CM | POA: Diagnosis not present

## 2019-07-22 DIAGNOSIS — Z992 Dependence on renal dialysis: Secondary | ICD-10-CM | POA: Diagnosis not present

## 2019-07-22 DIAGNOSIS — N186 End stage renal disease: Secondary | ICD-10-CM | POA: Diagnosis not present

## 2019-07-22 DIAGNOSIS — D689 Coagulation defect, unspecified: Secondary | ICD-10-CM | POA: Diagnosis not present

## 2019-07-23 ENCOUNTER — Other Ambulatory Visit: Payer: Self-pay | Admitting: Nurse Practitioner

## 2019-07-23 DIAGNOSIS — E1129 Type 2 diabetes mellitus with other diabetic kidney complication: Secondary | ICD-10-CM | POA: Diagnosis not present

## 2019-07-23 DIAGNOSIS — N186 End stage renal disease: Secondary | ICD-10-CM | POA: Diagnosis not present

## 2019-07-23 DIAGNOSIS — K219 Gastro-esophageal reflux disease without esophagitis: Secondary | ICD-10-CM

## 2019-07-23 DIAGNOSIS — Z992 Dependence on renal dialysis: Secondary | ICD-10-CM | POA: Diagnosis not present

## 2019-07-24 DIAGNOSIS — D631 Anemia in chronic kidney disease: Secondary | ICD-10-CM | POA: Diagnosis not present

## 2019-07-24 DIAGNOSIS — D689 Coagulation defect, unspecified: Secondary | ICD-10-CM | POA: Diagnosis not present

## 2019-07-24 DIAGNOSIS — N2581 Secondary hyperparathyroidism of renal origin: Secondary | ICD-10-CM | POA: Diagnosis not present

## 2019-07-24 DIAGNOSIS — N186 End stage renal disease: Secondary | ICD-10-CM | POA: Diagnosis not present

## 2019-07-24 DIAGNOSIS — Z992 Dependence on renal dialysis: Secondary | ICD-10-CM | POA: Diagnosis not present

## 2019-07-24 DIAGNOSIS — E1122 Type 2 diabetes mellitus with diabetic chronic kidney disease: Secondary | ICD-10-CM | POA: Diagnosis not present

## 2019-07-26 DIAGNOSIS — D689 Coagulation defect, unspecified: Secondary | ICD-10-CM | POA: Diagnosis not present

## 2019-07-26 DIAGNOSIS — E1122 Type 2 diabetes mellitus with diabetic chronic kidney disease: Secondary | ICD-10-CM | POA: Diagnosis not present

## 2019-07-26 DIAGNOSIS — Z992 Dependence on renal dialysis: Secondary | ICD-10-CM | POA: Diagnosis not present

## 2019-07-26 DIAGNOSIS — N2581 Secondary hyperparathyroidism of renal origin: Secondary | ICD-10-CM | POA: Diagnosis not present

## 2019-07-26 DIAGNOSIS — D631 Anemia in chronic kidney disease: Secondary | ICD-10-CM | POA: Diagnosis not present

## 2019-07-26 DIAGNOSIS — N186 End stage renal disease: Secondary | ICD-10-CM | POA: Diagnosis not present

## 2019-07-29 DIAGNOSIS — Z992 Dependence on renal dialysis: Secondary | ICD-10-CM | POA: Diagnosis not present

## 2019-07-29 DIAGNOSIS — N186 End stage renal disease: Secondary | ICD-10-CM | POA: Diagnosis not present

## 2019-07-29 DIAGNOSIS — D631 Anemia in chronic kidney disease: Secondary | ICD-10-CM | POA: Diagnosis not present

## 2019-07-29 DIAGNOSIS — D689 Coagulation defect, unspecified: Secondary | ICD-10-CM | POA: Diagnosis not present

## 2019-07-29 DIAGNOSIS — N2581 Secondary hyperparathyroidism of renal origin: Secondary | ICD-10-CM | POA: Diagnosis not present

## 2019-07-29 DIAGNOSIS — E1122 Type 2 diabetes mellitus with diabetic chronic kidney disease: Secondary | ICD-10-CM | POA: Diagnosis not present

## 2019-07-31 DIAGNOSIS — E1122 Type 2 diabetes mellitus with diabetic chronic kidney disease: Secondary | ICD-10-CM | POA: Diagnosis not present

## 2019-07-31 DIAGNOSIS — N2581 Secondary hyperparathyroidism of renal origin: Secondary | ICD-10-CM | POA: Diagnosis not present

## 2019-07-31 DIAGNOSIS — Z992 Dependence on renal dialysis: Secondary | ICD-10-CM | POA: Diagnosis not present

## 2019-07-31 DIAGNOSIS — D631 Anemia in chronic kidney disease: Secondary | ICD-10-CM | POA: Diagnosis not present

## 2019-07-31 DIAGNOSIS — D689 Coagulation defect, unspecified: Secondary | ICD-10-CM | POA: Diagnosis not present

## 2019-07-31 DIAGNOSIS — N186 End stage renal disease: Secondary | ICD-10-CM | POA: Diagnosis not present

## 2019-08-02 DIAGNOSIS — N2581 Secondary hyperparathyroidism of renal origin: Secondary | ICD-10-CM | POA: Diagnosis not present

## 2019-08-02 DIAGNOSIS — D631 Anemia in chronic kidney disease: Secondary | ICD-10-CM | POA: Diagnosis not present

## 2019-08-02 DIAGNOSIS — Z992 Dependence on renal dialysis: Secondary | ICD-10-CM | POA: Diagnosis not present

## 2019-08-02 DIAGNOSIS — D689 Coagulation defect, unspecified: Secondary | ICD-10-CM | POA: Diagnosis not present

## 2019-08-02 DIAGNOSIS — E1122 Type 2 diabetes mellitus with diabetic chronic kidney disease: Secondary | ICD-10-CM | POA: Diagnosis not present

## 2019-08-02 DIAGNOSIS — N186 End stage renal disease: Secondary | ICD-10-CM | POA: Diagnosis not present

## 2019-08-04 DIAGNOSIS — H401131 Primary open-angle glaucoma, bilateral, mild stage: Secondary | ICD-10-CM | POA: Diagnosis not present

## 2019-08-04 DIAGNOSIS — Z961 Presence of intraocular lens: Secondary | ICD-10-CM | POA: Diagnosis not present

## 2019-08-04 DIAGNOSIS — H10413 Chronic giant papillary conjunctivitis, bilateral: Secondary | ICD-10-CM | POA: Diagnosis not present

## 2019-08-05 DIAGNOSIS — E1122 Type 2 diabetes mellitus with diabetic chronic kidney disease: Secondary | ICD-10-CM | POA: Diagnosis not present

## 2019-08-05 DIAGNOSIS — N186 End stage renal disease: Secondary | ICD-10-CM | POA: Diagnosis not present

## 2019-08-05 DIAGNOSIS — D631 Anemia in chronic kidney disease: Secondary | ICD-10-CM | POA: Diagnosis not present

## 2019-08-05 DIAGNOSIS — N2581 Secondary hyperparathyroidism of renal origin: Secondary | ICD-10-CM | POA: Diagnosis not present

## 2019-08-05 DIAGNOSIS — D689 Coagulation defect, unspecified: Secondary | ICD-10-CM | POA: Diagnosis not present

## 2019-08-05 DIAGNOSIS — Z992 Dependence on renal dialysis: Secondary | ICD-10-CM | POA: Diagnosis not present

## 2019-08-06 ENCOUNTER — Other Ambulatory Visit: Payer: Self-pay | Admitting: Nurse Practitioner

## 2019-08-06 DIAGNOSIS — I639 Cerebral infarction, unspecified: Secondary | ICD-10-CM

## 2019-08-07 DIAGNOSIS — D689 Coagulation defect, unspecified: Secondary | ICD-10-CM | POA: Diagnosis not present

## 2019-08-07 DIAGNOSIS — N186 End stage renal disease: Secondary | ICD-10-CM | POA: Diagnosis not present

## 2019-08-07 DIAGNOSIS — E1122 Type 2 diabetes mellitus with diabetic chronic kidney disease: Secondary | ICD-10-CM | POA: Diagnosis not present

## 2019-08-07 DIAGNOSIS — Z992 Dependence on renal dialysis: Secondary | ICD-10-CM | POA: Diagnosis not present

## 2019-08-07 DIAGNOSIS — N2581 Secondary hyperparathyroidism of renal origin: Secondary | ICD-10-CM | POA: Diagnosis not present

## 2019-08-07 DIAGNOSIS — D631 Anemia in chronic kidney disease: Secondary | ICD-10-CM | POA: Diagnosis not present

## 2019-08-09 DIAGNOSIS — N186 End stage renal disease: Secondary | ICD-10-CM | POA: Diagnosis not present

## 2019-08-09 DIAGNOSIS — D689 Coagulation defect, unspecified: Secondary | ICD-10-CM | POA: Diagnosis not present

## 2019-08-09 DIAGNOSIS — Z992 Dependence on renal dialysis: Secondary | ICD-10-CM | POA: Diagnosis not present

## 2019-08-09 DIAGNOSIS — E1122 Type 2 diabetes mellitus with diabetic chronic kidney disease: Secondary | ICD-10-CM | POA: Diagnosis not present

## 2019-08-09 DIAGNOSIS — N2581 Secondary hyperparathyroidism of renal origin: Secondary | ICD-10-CM | POA: Diagnosis not present

## 2019-08-09 DIAGNOSIS — D631 Anemia in chronic kidney disease: Secondary | ICD-10-CM | POA: Diagnosis not present

## 2019-08-12 DIAGNOSIS — N186 End stage renal disease: Secondary | ICD-10-CM | POA: Diagnosis not present

## 2019-08-12 DIAGNOSIS — D689 Coagulation defect, unspecified: Secondary | ICD-10-CM | POA: Diagnosis not present

## 2019-08-12 DIAGNOSIS — D631 Anemia in chronic kidney disease: Secondary | ICD-10-CM | POA: Diagnosis not present

## 2019-08-12 DIAGNOSIS — N2581 Secondary hyperparathyroidism of renal origin: Secondary | ICD-10-CM | POA: Diagnosis not present

## 2019-08-12 DIAGNOSIS — E1122 Type 2 diabetes mellitus with diabetic chronic kidney disease: Secondary | ICD-10-CM | POA: Diagnosis not present

## 2019-08-12 DIAGNOSIS — Z992 Dependence on renal dialysis: Secondary | ICD-10-CM | POA: Diagnosis not present

## 2019-08-14 DIAGNOSIS — N186 End stage renal disease: Secondary | ICD-10-CM | POA: Diagnosis not present

## 2019-08-14 DIAGNOSIS — E1122 Type 2 diabetes mellitus with diabetic chronic kidney disease: Secondary | ICD-10-CM | POA: Diagnosis not present

## 2019-08-14 DIAGNOSIS — D631 Anemia in chronic kidney disease: Secondary | ICD-10-CM | POA: Diagnosis not present

## 2019-08-14 DIAGNOSIS — N2581 Secondary hyperparathyroidism of renal origin: Secondary | ICD-10-CM | POA: Diagnosis not present

## 2019-08-14 DIAGNOSIS — Z992 Dependence on renal dialysis: Secondary | ICD-10-CM | POA: Diagnosis not present

## 2019-08-14 DIAGNOSIS — D689 Coagulation defect, unspecified: Secondary | ICD-10-CM | POA: Diagnosis not present

## 2019-08-16 ENCOUNTER — Other Ambulatory Visit: Payer: Self-pay | Admitting: Nurse Practitioner

## 2019-08-16 DIAGNOSIS — E1122 Type 2 diabetes mellitus with diabetic chronic kidney disease: Secondary | ICD-10-CM | POA: Diagnosis not present

## 2019-08-16 DIAGNOSIS — D689 Coagulation defect, unspecified: Secondary | ICD-10-CM | POA: Diagnosis not present

## 2019-08-16 DIAGNOSIS — Z992 Dependence on renal dialysis: Secondary | ICD-10-CM | POA: Diagnosis not present

## 2019-08-16 DIAGNOSIS — N2581 Secondary hyperparathyroidism of renal origin: Secondary | ICD-10-CM | POA: Diagnosis not present

## 2019-08-16 DIAGNOSIS — F419 Anxiety disorder, unspecified: Secondary | ICD-10-CM

## 2019-08-16 DIAGNOSIS — N186 End stage renal disease: Secondary | ICD-10-CM | POA: Diagnosis not present

## 2019-08-16 DIAGNOSIS — F329 Major depressive disorder, single episode, unspecified: Secondary | ICD-10-CM

## 2019-08-16 DIAGNOSIS — D631 Anemia in chronic kidney disease: Secondary | ICD-10-CM | POA: Diagnosis not present

## 2019-08-18 ENCOUNTER — Encounter: Payer: Self-pay | Admitting: Nurse Practitioner

## 2019-08-18 LAB — COMPREHENSIVE METABOLIC PANEL
Albumin: 4.2 (ref 3.5–5.0)
Calcium: 8.8 (ref 8.7–10.7)

## 2019-08-18 LAB — BASIC METABOLIC PANEL: Potassium: 4.2 (ref 3.4–5.3)

## 2019-08-18 LAB — CBC AND DIFFERENTIAL: Hemoglobin: 10.4 — AB (ref 12.0–16.0)

## 2019-08-18 NOTE — Telephone Encounter (Signed)
Pended Rx and sent to [facility] for approval due to [place].    Has upcoming appointment 08/22/19

## 2019-08-19 DIAGNOSIS — E1122 Type 2 diabetes mellitus with diabetic chronic kidney disease: Secondary | ICD-10-CM | POA: Diagnosis not present

## 2019-08-19 DIAGNOSIS — N186 End stage renal disease: Secondary | ICD-10-CM | POA: Diagnosis not present

## 2019-08-19 DIAGNOSIS — N2581 Secondary hyperparathyroidism of renal origin: Secondary | ICD-10-CM | POA: Diagnosis not present

## 2019-08-19 DIAGNOSIS — Z992 Dependence on renal dialysis: Secondary | ICD-10-CM | POA: Diagnosis not present

## 2019-08-19 DIAGNOSIS — D631 Anemia in chronic kidney disease: Secondary | ICD-10-CM | POA: Diagnosis not present

## 2019-08-19 DIAGNOSIS — D689 Coagulation defect, unspecified: Secondary | ICD-10-CM | POA: Diagnosis not present

## 2019-08-21 DIAGNOSIS — D689 Coagulation defect, unspecified: Secondary | ICD-10-CM | POA: Diagnosis not present

## 2019-08-21 DIAGNOSIS — E1122 Type 2 diabetes mellitus with diabetic chronic kidney disease: Secondary | ICD-10-CM | POA: Diagnosis not present

## 2019-08-21 DIAGNOSIS — N186 End stage renal disease: Secondary | ICD-10-CM | POA: Diagnosis not present

## 2019-08-21 DIAGNOSIS — N2581 Secondary hyperparathyroidism of renal origin: Secondary | ICD-10-CM | POA: Diagnosis not present

## 2019-08-21 DIAGNOSIS — D631 Anemia in chronic kidney disease: Secondary | ICD-10-CM | POA: Diagnosis not present

## 2019-08-21 DIAGNOSIS — Z992 Dependence on renal dialysis: Secondary | ICD-10-CM | POA: Diagnosis not present

## 2019-08-22 ENCOUNTER — Encounter: Payer: Self-pay | Admitting: Nurse Practitioner

## 2019-08-22 ENCOUNTER — Encounter: Payer: Self-pay | Admitting: Internal Medicine

## 2019-08-22 ENCOUNTER — Other Ambulatory Visit: Payer: Self-pay

## 2019-08-22 ENCOUNTER — Ambulatory Visit (INDEPENDENT_AMBULATORY_CARE_PROVIDER_SITE_OTHER): Payer: Medicare Other | Admitting: Nurse Practitioner

## 2019-08-22 VITALS — BP 138/82 | HR 82 | Temp 97.7°F | Ht 65.0 in | Wt 180.0 lb

## 2019-08-22 DIAGNOSIS — R61 Generalized hyperhidrosis: Secondary | ICD-10-CM

## 2019-08-22 DIAGNOSIS — N186 End stage renal disease: Secondary | ICD-10-CM

## 2019-08-22 DIAGNOSIS — I10 Essential (primary) hypertension: Secondary | ICD-10-CM | POA: Diagnosis not present

## 2019-08-22 DIAGNOSIS — F419 Anxiety disorder, unspecified: Secondary | ICD-10-CM | POA: Diagnosis not present

## 2019-08-22 DIAGNOSIS — L309 Dermatitis, unspecified: Secondary | ICD-10-CM

## 2019-08-22 DIAGNOSIS — E785 Hyperlipidemia, unspecified: Secondary | ICD-10-CM

## 2019-08-22 DIAGNOSIS — Z992 Dependence on renal dialysis: Secondary | ICD-10-CM | POA: Diagnosis not present

## 2019-08-22 DIAGNOSIS — F32A Depression, unspecified: Secondary | ICD-10-CM

## 2019-08-22 DIAGNOSIS — E1129 Type 2 diabetes mellitus with other diabetic kidney complication: Secondary | ICD-10-CM | POA: Diagnosis not present

## 2019-08-22 DIAGNOSIS — F329 Major depressive disorder, single episode, unspecified: Secondary | ICD-10-CM

## 2019-08-22 DIAGNOSIS — M1990 Unspecified osteoarthritis, unspecified site: Secondary | ICD-10-CM

## 2019-08-22 DIAGNOSIS — Z720 Tobacco use: Secondary | ICD-10-CM

## 2019-08-22 DIAGNOSIS — Z794 Long term (current) use of insulin: Secondary | ICD-10-CM | POA: Diagnosis not present

## 2019-08-22 DIAGNOSIS — E1149 Type 2 diabetes mellitus with other diabetic neurological complication: Secondary | ICD-10-CM

## 2019-08-22 DIAGNOSIS — F320 Major depressive disorder, single episode, mild: Secondary | ICD-10-CM

## 2019-08-22 DIAGNOSIS — G4700 Insomnia, unspecified: Secondary | ICD-10-CM

## 2019-08-22 NOTE — Patient Instructions (Signed)
Make sure you are taking zoloft (sertraline) 50 mg daily.

## 2019-08-22 NOTE — Progress Notes (Signed)
Careteam: Patient Care Team: Deborah Chandler, NP as PCP - General (Nurse Practitioner) Deborah Jacks, MD as Consulting Physician (Ophthalmology) Deborah Heinz, MD as Consulting Physician (Nephrology) Deborah Gu, NP as Nurse Practitioner (Nurse Practitioner)  PLACE OF SERVICE:  [place] Directive information Does Patient Have a Medical Advance Directive?: No, Would patient like information on creating a medical advance directive?: No - Patient declined  Allergies  Allergen Reactions  . Lisinopril Other (See Comments)    Abnormal Kidney Function   . Pioglitazone Other (See Comments)    REACTION: Desquamation of skin of the palm  . Morphine Other (See Comments)  . Other Nausea And Vomiting  . Morphine And Related Nausea And Vomiting  . Sulfonamide Derivatives Rash    Chief Complaint  Patient presents with  . Medical Management of Chronic Issues    6 month follow up      HPI: Patient is a 74 y.o. female who presents to the office for a 6 month follow up. Pt is sweating randomly and wants something for this. reports she has done this her entire life and brother and father also have issues with sweating.   Anxiety- Pt also wants her xanax back to help for her nerves. Unsure if she is taking her zoloft. 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 Pts hands/figers "lock up", they mostly do this at HD and it is painful. Pt also gets leg cramps and will get chest pain. She actually went to ED due to this in the past, since then there has been no increase in chest pain.   Pt states that she needs to get her 2nd shingles shot and will pharmacy to do that.   DM- dietary controlled.   Smoker- Pt continues to smoke, pt does not want to quit smoking at this time.   Pt has history of CVA (2017)- pt does not recall this event and has no deficits. Continues on  plavix and ASA 81  mg   Hyperlipidemia- continues on pravastatin.   Insomnia- sleeps well on trazodone.   Review of Systems:  Review of Systems  Constitutional: Positive for diaphoresis. Negative for chills and fever.  HENT: Negative for hearing loss.   Respiratory: Negative for cough and shortness of breath.   Cardiovascular: Positive for chest pain. Negative for palpitations.  Gastrointestinal: Negative for abdominal pain, constipation, nausea and vomiting.  Genitourinary: Negative.   Musculoskeletal: Negative for falls.  Skin: Negative.   Neurological: Positive for headaches (daily- normal). Negative for dizziness and weakness.  Endo/Heme/Allergies: Does not bruise/bleed easily.  Psychiatric/Behavioral: The patient is nervous/anxious (wants her Xanax back).     Past Medical History:  Diagnosis Date  . Anemia   . Anxiety   . Arthritis    lower back and knees   . Asthma    childhood  . Barrett's esophagus   . Chronic kidney disease   . Coagulation defect ([place])   . Depression   . Diabetes mellitus   . Dysphagia   . Eczema   . End stage renal disease ([place])   . Fluid overload, unspecified   . History of herpes zoster 02/2010   Recovered fully after period of acute herpetic neuralgia tx w/ gabapentin.   Marland Kitchen Hx MRSA infection 2009  . Hypercalcemia   . Hyperlipidemia   . Hypertension   . Moderate protein-calorie malnutrition ([place])   .  Neuromuscular disorder (HCC)    chronic pain  . Personal history of colonic polyps 10/17/2010   hyperplastic   Past Surgical History:  Procedure Laterality Date  . ABDOMINAL HYSTERECTOMY     unclear when  . AV FISTULA PLACEMENT Left 02/13/2017   Procedure: ARTERIOVENOUS (AV) GORE-TEX STRETCH GRAFT INSERTION INTO LEFT ARM;  Surgeon: Elam Dutch, MD;  Location: [facility];  Service: Vascular;  Laterality: Left;  . CATARACT EXTRACTION Bilateral   . thigh surg     left side due to MRSA   Social History:   reports that she has been smoking cigarettes. She  has a 40.00 pack-year smoking history. She has never used smokeless tobacco. She reports current alcohol use of about 2.0 standard drinks of alcohol per week. She reports that she does not use drugs.  Family History  Problem Relation Age of Onset  . Hypertension Father   . Kidney disease Father   . Diabetes Brother   . Colon cancer Neg Hx   . CAD Neg Hx     Medications: Patient's Medications  New Prescriptions   No medications on file  Previous Medications   ASPIRIN 81 MG CHEWABLE TABLET    Chew 1 tablet (81 mg total) by mouth daily.   CALCIUM ACETATE (PHOSLO) 667 MG CAPSULE    Take by mouth 3 (three) times daily with meals.   CINACALCET (SENSIPAR) 30 MG TABLET    Take 30 mg by mouth daily.   CLOPIDOGREL (PLAVIX) 75 MG TABLET    TAKE 1 TABLET (75 MG TOTAL) BY MOUTH DAILY. DO NOT USE BLISTER PACKS   EZETIMIBE (ZETIA) 10 MG TABLET    TAKE 1 TABLET BY MOUTH EVERY DAY   PRAVASTATIN (PRAVACHOL) 40 MG TABLET    TAKE 1 TABLET BY MOUTH EVERYDAY AT BEDTIME   SERTRALINE (ZOLOFT) 50 MG TABLET    TAKE 1 TABLET BY MOUTH EVERY DAY   SEVELAMER CARBONATE (RENVELA) 2.4 G PACK    2.4 g 3 (three) times daily with meals.   TRAZODONE (DESYREL) 100 MG TABLET    Take 1 tablet (100 mg total) by mouth at bedtime. For sleep   TRIAMCINOLONE CREAM (KENALOG) 0.1 %    APPLY 1 APPLICATION TOPICALLY 3 (THREE) TIMES DAILY AS NEEDED (RASH).  Modified Medications   No medications on file  Discontinued Medications   ALPRAZOLAM (XANAX) 0.25 MG TABLET    Take 1 tablet (0.25 mg total) by mouth daily as needed for anxiety.    Physical Exam:  Vitals:   08/22/19 0856  BP: 138/82  Pulse: 82  Temp: 97.7 F (36.5 C)  TempSrc: Temporal  SpO2: 97%  Weight: 180 lb (81.6 kg)  Height: 5\' 5"  (1.651 m)   Body mass index is 29.95 kg/m. Wt Readings from Last 3 Encounters:  08/22/19 180 lb (81.6 kg)  03/07/19 186 lb (84.4 kg)  02/21/19 185 lb 3.2 oz (84 kg)    Physical Exam Vitals and nursing note reviewed.   Constitutional:      Appearance: Normal appearance.  HENT:     Right Ear: Tympanic membrane, ear canal and external ear normal.     Left Ear: Tympanic membrane, ear canal and external ear normal.  Eyes:     Conjunctiva/sclera: Conjunctivae normal.     Pupils: Pupils are equal, round, and reactive to light.  Cardiovascular:     Rate and Rhythm: Normal rate and regular rhythm.     Pulses: Normal pulses.  Dorsalis pedis pulses are 2+ on the right side and 2+ on the left side.     Heart sounds: Normal heart sounds, S1 normal and S2 normal.  Pulmonary:     Effort: Pulmonary effort is normal.     Breath sounds: Normal breath sounds.  Abdominal:     General: Abdomen is protuberant. Bowel sounds are normal.     Palpations: Abdomen is soft.  Musculoskeletal:     Right lower leg: No edema.     Left lower leg: No edema.  Feet:     Right foot:     Protective Sensation: 3 sites tested. 3 sites sensed.     Skin integrity: Skin integrity normal.     Toenail Condition: Right toenails are normal.     Left foot:     Protective Sensation: 3 sites tested. 3 sites sensed.     Skin integrity: Skin integrity normal.     Toenail Condition: Left toenails are normal.     Comments: Diabetic foot exam normal. Skin:    General: Skin is warm and dry.  Neurological:     General: No focal deficit present.     Mental Status: She is alert and oriented to person, place, and time.  Psychiatric:        Attention and Perception: Attention normal.        Mood and Affect: Mood normal. Affect is blunt and flat.        Speech: Speech normal.        Behavior: Behavior normal. Behavior is cooperative.        Thought Content: Thought content normal.        Cognition and Memory: Cognition and memory normal.     Labs reviewed: Basic Metabolic Panel: No results for input(s): NA, K, CL, CO2, GLUCOSE, BUN, CREATININE, CALCIUM, MG, PHOS, TSH in the last 8760 hours. Liver Function Tests: No results for  input(s): AST, ALT, ALKPHOS, BILITOT, PROT, ALBUMIN in the last 8760 hours. No results for input(s): LIPASE, AMYLASE in the last 8760 hours. No results for input(s): AMMONIA in the last 8760 hours. CBC: No results for input(s): WBC, NEUTROABS, HGB, HCT, MCV, PLT in the last 8760 hours. Lipid Panel: No results for input(s): CHOL, HDL, LDLCALC, TRIG, CHOLHDL, LDLDIRECT in the last 8760 hours. TSH: No results for input(s): TSH in the last 8760 hours. A1C: Lab Results  Component Value Date   HGBA1C 5.3 02/06/2019     Assessment/Plan 1. Anxiety and depression -stable, requesting xanax however she is not sure if she is taking zoloft as prescribed, encouraged to makes sure she was taking this and educated that this is also for anxiety/depression. Also encouraged things like therapy, mediation, deep breathing, exercise and journaling to help with mood.  - TSH  2. Essential hypertension - Stable. Continues off medication, continue with low sodium diet.    3. Insomnia, unspecified type -Stable, continue trazodone 100mg  tablet at bedtime for sleep  4. Tobacco abuse -Smoking cessation education provided. Pt states that she knows she needs to quit, but does not want to right now.  5. Dermatitis -Stable, pt is still using Kenalog 0.1% cream on her lower extremities at needed  6. CKD (chronic kidney disease) stage 5, GFR less than 15 ml/min on HD ([place]) -Pt followed by nephrologist. Continue Phoslo 667 mg by mouth 3 times daily with meals; continue Sensipar 30 mg tablet daily; continue Renvela 2.4g three times daily with meals -HD T/T/Sat weekly, pt does not miss any  appointments.  7. 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 - Hemoglobin A1c  8. Hyperlipidemia with target LDL less than 70 -Stable, continue Zetia 10 mg tablet daily, continue  Prevastatin 40 mg tablet daily at night - Lipid Panel  9. Arthritis -Stable. Encouraged lifestyle modifications and daily exercise.  10. Sweating excessive -Pt is sweating without activity. She will break out in a sweat without doing any physical activity. Pt brother does this as well so suspect this is due to genetics, however will follow up TSH at this time.   11. Depression Stable on trazodone, unsure if she is taking zoloft routinely but plans to start. No SI or HI.     Next appt: Follow up in 6 months, sooner if needed  [name] K. Zailey Audia, [facility] [phone]   I personally was present during the history, physical exam and medical decision-making activities of this service and have verified that the service and findings are accurately documented in the student's note

## 2019-08-22 NOTE — Progress Notes (Signed)
This encounter was created in error - please disregard.

## 2019-08-22 NOTE — Progress Notes (Deleted)
Careteam: Patient Care Team: [name], NP as PCP - General (Nurse Practitioner) [name], MD as Consulting Physician (Ophthalmology) [name], MD as Consulting Physician (Nephrology) [name], NP as Nurse Practitioner (Nurse Practitioner)  PLACE OF SERVICE:  [place] Directive information Does Patient Have a Medical Advance Directive?: No, Would patient like information on creating a medical advance directive?: No - Patient declined  Allergies  Allergen Reactions  . Lisinopril Other (See Comments)    Abnormal Kidney Function   . Pioglitazone Other (See Comments)    REACTION: Desquamation of skin of the palm  . Morphine Other (See Comments)  . Other Nausea And Vomiting  . Morphine And Related Nausea And Vomiting  . Sulfonamide Derivatives Rash    Chief Complaint  Patient presents with  . Medical Management of Chronic Issues    6 month follow up      HPI: Patient is a 74 y.o. female who presents to the office for a 6 month follow up. Pt is sweating randomly and wants something for this. Pt also wants her xanax back to help for her nerves. Pt states that she needs to get her 2nd shingles shot and will pharmacy to do that. Pts hands/figers "lock up", they mostly do this at HD and it is painful. Pt also gets leg cramps. Pt is going to HD on T/T/S. Pt is not taking any medications for her DM- dietary controlled. Pt continues to smoke, pt does not want to quit smoking at this time. Pt has history of CVA (2017)- pt does not recall this event and has no deficits. Pt is sleeping okay with the trazadone.Pt has random chest pains when on HD- none with activity outside of HD.   Review of Systems:  Review of Systems  Constitutional: Positive for diaphoresis. Negative for chills and fever.  HENT: Negative for hearing loss.   Respiratory: Negative for cough and shortness of breath.   Cardiovascular: Positive for chest pain. Negative for  palpitations.  Gastrointestinal: Negative for abdominal pain, constipation, nausea and vomiting.  Genitourinary: Negative.   Musculoskeletal: Negative for falls.  Skin: Negative.   Neurological: Positive for headaches (daily- normal). Negative for dizziness and weakness.  Endo/Heme/Allergies: Does not bruise/bleed easily.  Psychiatric/Behavioral: The patient is nervous/anxious (wants her Xanax back).     Past Medical History:  Diagnosis Date  . Anemia   . Anxiety   . Arthritis    lower back and knees   . Asthma    childhood  . Barrett's esophagus   . Chronic kidney disease   . Coagulation defect ([place])   . Depression   . Diabetes mellitus   . Dysphagia   . Eczema   . End stage renal disease ([place])   . Fluid overload, unspecified   . History of herpes zoster 02/2010   Recovered fully after period of acute herpetic neuralgia tx w/ gabapentin.   Marland Kitchen Hx MRSA infection 2009  . Hypercalcemia   . Hyperlipidemia   . Hypertension   . Moderate protein-calorie malnutrition ([place])   . Neuromuscular disorder (HCC)    chronic pain  . Personal history of colonic polyps 10/17/2010   hyperplastic   Past Surgical History:  Procedure Laterality Date  . ABDOMINAL HYSTERECTOMY     unclear when  . AV FISTULA PLACEMENT Left 02/13/2017   Procedure: ARTERIOVENOUS (AV) GORE-TEX STRETCH GRAFT INSERTION INTO LEFT ARM;  Surgeon: Elam Dutch, MD;  Location: [facility];  Service: Vascular;  Laterality: Left;  . CATARACT EXTRACTION Bilateral   . thigh surg     left side due to MRSA   Social History:   reports that she has been smoking cigarettes. She has a 40.00 pack-year smoking history. She has never used smokeless tobacco. She reports current alcohol use of about 2.0 standard drinks of alcohol per week. She reports that she does not use drugs.  Family History  Problem Relation Age of Onset  . Hypertension Father   . Kidney disease Father   . Diabetes Brother   . Colon cancer Neg Hx   . CAD  Neg Hx     Medications: Patient's Medications  New Prescriptions   No medications on file  Previous Medications   ASPIRIN 81 MG CHEWABLE TABLET    Chew 1 tablet (81 mg total) by mouth daily.   CALCIUM ACETATE (PHOSLO) 667 MG CAPSULE    Take by mouth 3 (three) times daily with meals.   CINACALCET (SENSIPAR) 30 MG TABLET    Take 30 mg by mouth daily.   CLOPIDOGREL (PLAVIX) 75 MG TABLET    TAKE 1 TABLET (75 MG TOTAL) BY MOUTH DAILY. DO NOT USE BLISTER PACKS   EZETIMIBE (ZETIA) 10 MG TABLET    TAKE 1 TABLET BY MOUTH EVERY DAY   PRAVASTATIN (PRAVACHOL) 40 MG TABLET    TAKE 1 TABLET BY MOUTH EVERYDAY AT BEDTIME   SERTRALINE (ZOLOFT) 50 MG TABLET    TAKE 1 TABLET BY MOUTH EVERY DAY   SEVELAMER CARBONATE (RENVELA) 2.4 G PACK    2.4 g 3 (three) times daily with meals.   TRAZODONE (DESYREL) 100 MG TABLET    Take 1 tablet (100 mg total) by mouth at bedtime. For sleep   TRIAMCINOLONE CREAM (KENALOG) 0.1 %    APPLY 1 APPLICATION TOPICALLY 3 (THREE) TIMES DAILY AS NEEDED (RASH).  Modified Medications   No medications on file  Discontinued Medications   ALPRAZOLAM (XANAX) 0.25 MG TABLET    Take 1 tablet (0.25 mg total) by mouth daily as needed for anxiety.    Physical Exam:  Vitals:   08/22/19 0856  BP: 138/82  Pulse: 82  Temp: 97.7 F (36.5 C)  TempSrc: Temporal  SpO2: 97%  Weight: 180 lb (81.6 kg)  Height: 5\' 5"  (1.651 m)   Body mass index is 29.95 kg/m. Wt Readings from Last 3 Encounters:  08/22/19 180 lb (81.6 kg)  03/07/19 186 lb (84.4 kg)  02/21/19 185 lb 3.2 oz (84 kg)    Physical Exam Vitals and nursing note reviewed.  Constitutional:      Appearance: Normal appearance.  HENT:     Right Ear: Tympanic membrane, ear canal and external ear normal.     Left Ear: Tympanic membrane, ear canal and external ear normal.  Eyes:     Conjunctiva/sclera: Conjunctivae normal.     Pupils: Pupils are equal, round, and reactive to light.  Cardiovascular:     Rate and Rhythm: Normal  rate and regular rhythm.     Pulses: Normal pulses.          Dorsalis pedis pulses are 2+ on the right side and 2+ on the left side.     Heart sounds: Normal heart sounds, S1 normal and S2 normal.  Pulmonary:     Effort: Pulmonary effort is normal.     Breath sounds: Normal breath sounds.  Abdominal:     General: Abdomen is protuberant. Bowel sounds are normal.     Palpations:  Abdomen is soft.  Musculoskeletal:     Right lower leg: No edema.     Left lower leg: No edema.  Feet:     Right foot:     Protective Sensation: 3 sites tested. 3 sites sensed.     Skin integrity: Skin integrity normal.     Toenail Condition: Right toenails are normal.     Left foot:     Protective Sensation: 3 sites tested. 3 sites sensed.     Skin integrity: Skin integrity normal.     Toenail Condition: Left toenails are normal.     Comments: Diabetic foot exam normal. Skin:    General: Skin is warm and dry.  Neurological:     General: No focal deficit present.     Mental Status: She is alert and oriented to person, place, and time.  Psychiatric:        Attention and Perception: Attention normal.        Mood and Affect: Mood normal. Affect is blunt and flat.        Speech: Speech normal.        Behavior: Behavior normal. Behavior is cooperative.        Thought Content: Thought content normal.        Cognition and Memory: Cognition and memory normal.     Labs reviewed: Basic Metabolic Panel: No results for input(s): NA, K, CL, CO2, GLUCOSE, BUN, CREATININE, CALCIUM, MG, PHOS, TSH in the last 8760 hours. Liver Function Tests: No results for input(s): AST, ALT, ALKPHOS, BILITOT, PROT, ALBUMIN in the last 8760 hours. No results for input(s): LIPASE, AMYLASE in the last 8760 hours. No results for input(s): AMMONIA in the last 8760 hours. CBC: No results for input(s): WBC, NEUTROABS, HGB, HCT, MCV, PLT in the last 8760 hours. Lipid Panel: No results for input(s): CHOL, HDL, LDLCALC, TRIG, CHOLHDL,  LDLDIRECT in the last 8760 hours. TSH: No results for input(s): TSH in the last 8760 hours. A1C: Lab Results  Component Value Date   HGBA1C 5.3 02/06/2019     Assessment/Plan 1. Anxiety and depression -Pt is asking for her Xanax back. -Pt does not recall taking her Zoloft 50mg  daily as prescribed. -Encouraged her to take this before adding a new medication. - TSH  2. Essential hypertension -  3. Insomnia, unspecified type ***  4. Tobacco abuse ***  5. Dermatitis ***  6. CKD (chronic kidney disease) stage 5, GFR less than 15 ml/min (HCC) ***  7. 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 Hyperlipidemia with target LDL less than 70 *** - Lipid Panel  9. Arthritis ***  10. Sweating excessive -Pt is sweating without activity. -Pt brother does this as well. -TSH   Next appt: Follow up in 6 months. Carlos American. [place], [facility] [phone]

## 2019-08-23 DIAGNOSIS — N2581 Secondary hyperparathyroidism of renal origin: Secondary | ICD-10-CM | POA: Diagnosis not present

## 2019-08-23 DIAGNOSIS — Z992 Dependence on renal dialysis: Secondary | ICD-10-CM | POA: Diagnosis not present

## 2019-08-23 DIAGNOSIS — N186 End stage renal disease: Secondary | ICD-10-CM | POA: Diagnosis not present

## 2019-08-23 DIAGNOSIS — D689 Coagulation defect, unspecified: Secondary | ICD-10-CM | POA: Diagnosis not present

## 2019-08-23 DIAGNOSIS — D631 Anemia in chronic kidney disease: Secondary | ICD-10-CM | POA: Diagnosis not present

## 2019-08-23 DIAGNOSIS — E1122 Type 2 diabetes mellitus with diabetic chronic kidney disease: Secondary | ICD-10-CM | POA: Diagnosis not present

## 2019-08-23 LAB — HEMOGLOBIN A1C
Hgb A1c MFr Bld: 5.2 % of total Hgb (ref <5.7)
Mean Plasma Glucose: 103 (calc)
eAG (mmol/L): 5.7 (calc)

## 2019-08-23 LAB — LIPID PANEL
Cholesterol: 120 mg/dL (ref <200)
HDL: 42 mg/dL — ABNORMAL LOW (ref >=50)
LDL Cholesterol (Calc): 49 mg/dL (calc)
Non-HDL Cholesterol (Calc): 78 mg/dL (calc) (ref <130)
Total CHOL/HDL Ratio: 2.9 (calc) (ref <5.0)
Triglycerides: 229 mg/dL — ABNORMAL HIGH (ref <150)

## 2019-08-23 LAB — TSH: TSH: 1.74 mIU/L (ref 0.40–4.50)

## 2019-08-25 ENCOUNTER — Telehealth: Payer: Self-pay | Admitting: *Deleted

## 2019-08-25 NOTE — Telephone Encounter (Signed)
Lets have her stop taking the sertraline since she is on the paroxetine (these medication are very similar) and could be contributing to the sweats.

## 2019-08-25 NOTE — Telephone Encounter (Signed)
Patient called and stated that she is having a problem getting her Paroxetine 40mg  one tablet daily refilled through her pharmacy.  I reviewed chart and it does not show in current medication list she is taking the Paroxetine. Shows she is suppose to be taking Sertraline. Patient stated that she is taking the Sertraline but she has also been taking the Paroxetine for a long time and wants a refill. Please Advise.   Also....patient stated that she was seen on Friday and the Shingles Vaccine Rx was suppose to be sent to her pharmacy but was not. Patient requesting you send it.

## 2019-08-25 NOTE — Telephone Encounter (Signed)
Discussed at last OV in regards to this, labs stable. Plan now to stop sertraline.

## 2019-08-25 NOTE — Telephone Encounter (Signed)
I do not see that we have refilled the paroxetine in quite sometime but we have been filling her sertraline. Can you call the pharmacy just to verify what she has been getting. Also she does not need a prescription for the shingles vaccine, she can just go and ask for it.

## 2019-08-25 NOTE — Telephone Encounter (Signed)
She should not be taking both of those medications together and recommend her stopping the sertraline, can we please call the pharmacy and have this removed from her medication list, lets add the Paroxetine dose and frequency to her medication list so this is up to date.

## 2019-08-25 NOTE — Telephone Encounter (Signed)
Patient states she's been sweating for a long time and thinks that her medications don't have anything to do with her sweating. Please Advise.

## 2019-08-25 NOTE — Telephone Encounter (Signed)
Pharmacy called and medication "Sertraline" is removed. Patient also has "Paroxetine 40mg  " added to medication list. Pharmacist states that patient takes "Paroxetine 40mg  1 time daily. Pharmacy also took medication off patient list. However patient is still expecting a call back with regards to her sweating, and how you plan to proceed with this concern. Please Advise.

## 2019-08-25 NOTE — Telephone Encounter (Signed)
Called [facility] and spoke with [place] and she stated that patient has been getting both. Stated that [facility] ([address]) refilled the Paroxetine in November. Stated that patient has been taking it and the Sertraline (LR: 08/18/19)  Please Advise.

## 2019-08-26 DIAGNOSIS — E1122 Type 2 diabetes mellitus with diabetic chronic kidney disease: Secondary | ICD-10-CM | POA: Diagnosis not present

## 2019-08-26 DIAGNOSIS — D689 Coagulation defect, unspecified: Secondary | ICD-10-CM | POA: Diagnosis not present

## 2019-08-26 DIAGNOSIS — N2581 Secondary hyperparathyroidism of renal origin: Secondary | ICD-10-CM | POA: Diagnosis not present

## 2019-08-26 DIAGNOSIS — D631 Anemia in chronic kidney disease: Secondary | ICD-10-CM | POA: Diagnosis not present

## 2019-08-26 DIAGNOSIS — Z992 Dependence on renal dialysis: Secondary | ICD-10-CM | POA: Diagnosis not present

## 2019-08-26 DIAGNOSIS — N186 End stage renal disease: Secondary | ICD-10-CM | POA: Diagnosis not present

## 2019-08-27 ENCOUNTER — Other Ambulatory Visit: Payer: Self-pay | Admitting: Nurse Practitioner

## 2019-08-27 DIAGNOSIS — K219 Gastro-esophageal reflux disease without esophagitis: Secondary | ICD-10-CM

## 2019-08-27 MED ORDER — PAROXETINE HCL 40 MG PO TABS: ORAL_TABLET | ORAL | 3 refills | Status: DC

## 2019-08-27 NOTE — Telephone Encounter (Signed)
Patient stated that pharmacy did not receive the refill. Pended Rx and sent to [facility] for approval due to [place].

## 2019-08-28 DIAGNOSIS — Z992 Dependence on renal dialysis: Secondary | ICD-10-CM | POA: Diagnosis not present

## 2019-08-28 DIAGNOSIS — E1122 Type 2 diabetes mellitus with diabetic chronic kidney disease: Secondary | ICD-10-CM | POA: Diagnosis not present

## 2019-08-28 DIAGNOSIS — D631 Anemia in chronic kidney disease: Secondary | ICD-10-CM | POA: Diagnosis not present

## 2019-08-28 DIAGNOSIS — D689 Coagulation defect, unspecified: Secondary | ICD-10-CM | POA: Diagnosis not present

## 2019-08-28 DIAGNOSIS — N186 End stage renal disease: Secondary | ICD-10-CM | POA: Diagnosis not present

## 2019-08-28 DIAGNOSIS — N2581 Secondary hyperparathyroidism of renal origin: Secondary | ICD-10-CM | POA: Diagnosis not present

## 2019-08-30 DIAGNOSIS — Z992 Dependence on renal dialysis: Secondary | ICD-10-CM | POA: Diagnosis not present

## 2019-08-30 DIAGNOSIS — E1122 Type 2 diabetes mellitus with diabetic chronic kidney disease: Secondary | ICD-10-CM | POA: Diagnosis not present

## 2019-08-30 DIAGNOSIS — D631 Anemia in chronic kidney disease: Secondary | ICD-10-CM | POA: Diagnosis not present

## 2019-08-30 DIAGNOSIS — N186 End stage renal disease: Secondary | ICD-10-CM | POA: Diagnosis not present

## 2019-08-30 DIAGNOSIS — D689 Coagulation defect, unspecified: Secondary | ICD-10-CM | POA: Diagnosis not present

## 2019-08-30 DIAGNOSIS — N2581 Secondary hyperparathyroidism of renal origin: Secondary | ICD-10-CM | POA: Diagnosis not present

## 2019-09-02 DIAGNOSIS — E1122 Type 2 diabetes mellitus with diabetic chronic kidney disease: Secondary | ICD-10-CM | POA: Diagnosis not present

## 2019-09-02 DIAGNOSIS — N186 End stage renal disease: Secondary | ICD-10-CM | POA: Diagnosis not present

## 2019-09-02 DIAGNOSIS — D631 Anemia in chronic kidney disease: Secondary | ICD-10-CM | POA: Diagnosis not present

## 2019-09-02 DIAGNOSIS — Z992 Dependence on renal dialysis: Secondary | ICD-10-CM | POA: Diagnosis not present

## 2019-09-02 DIAGNOSIS — N2581 Secondary hyperparathyroidism of renal origin: Secondary | ICD-10-CM | POA: Diagnosis not present

## 2019-09-02 DIAGNOSIS — D689 Coagulation defect, unspecified: Secondary | ICD-10-CM | POA: Diagnosis not present

## 2019-09-03 ENCOUNTER — Other Ambulatory Visit: Payer: Self-pay | Admitting: Nurse Practitioner

## 2019-09-03 DIAGNOSIS — L309 Dermatitis, unspecified: Secondary | ICD-10-CM

## 2019-09-04 ENCOUNTER — Other Ambulatory Visit: Payer: Self-pay | Admitting: Nurse Practitioner

## 2019-09-04 DIAGNOSIS — N2581 Secondary hyperparathyroidism of renal origin: Secondary | ICD-10-CM | POA: Diagnosis not present

## 2019-09-04 DIAGNOSIS — N186 End stage renal disease: Secondary | ICD-10-CM | POA: Diagnosis not present

## 2019-09-04 DIAGNOSIS — D689 Coagulation defect, unspecified: Secondary | ICD-10-CM | POA: Diagnosis not present

## 2019-09-04 DIAGNOSIS — E1122 Type 2 diabetes mellitus with diabetic chronic kidney disease: Secondary | ICD-10-CM | POA: Diagnosis not present

## 2019-09-04 DIAGNOSIS — G4700 Insomnia, unspecified: Secondary | ICD-10-CM

## 2019-09-04 DIAGNOSIS — D631 Anemia in chronic kidney disease: Secondary | ICD-10-CM | POA: Diagnosis not present

## 2019-09-04 DIAGNOSIS — Z992 Dependence on renal dialysis: Secondary | ICD-10-CM | POA: Diagnosis not present

## 2019-09-04 DIAGNOSIS — Z8673 Personal history of transient ischemic attack (TIA), and cerebral infarction without residual deficits: Secondary | ICD-10-CM

## 2019-09-04 DIAGNOSIS — F32A Depression, unspecified: Secondary | ICD-10-CM

## 2019-09-04 NOTE — Telephone Encounter (Signed)
Received ERx from Pharmacy Pended Rx and sent to [facility] for approval due to [place].

## 2019-09-06 DIAGNOSIS — Z992 Dependence on renal dialysis: Secondary | ICD-10-CM | POA: Diagnosis not present

## 2019-09-06 DIAGNOSIS — D631 Anemia in chronic kidney disease: Secondary | ICD-10-CM | POA: Diagnosis not present

## 2019-09-06 DIAGNOSIS — N186 End stage renal disease: Secondary | ICD-10-CM | POA: Diagnosis not present

## 2019-09-06 DIAGNOSIS — D689 Coagulation defect, unspecified: Secondary | ICD-10-CM | POA: Diagnosis not present

## 2019-09-06 DIAGNOSIS — E1122 Type 2 diabetes mellitus with diabetic chronic kidney disease: Secondary | ICD-10-CM | POA: Diagnosis not present

## 2019-09-06 DIAGNOSIS — N2581 Secondary hyperparathyroidism of renal origin: Secondary | ICD-10-CM | POA: Diagnosis not present

## 2019-09-09 DIAGNOSIS — D631 Anemia in chronic kidney disease: Secondary | ICD-10-CM | POA: Diagnosis not present

## 2019-09-09 DIAGNOSIS — Z992 Dependence on renal dialysis: Secondary | ICD-10-CM | POA: Diagnosis not present

## 2019-09-09 DIAGNOSIS — N2581 Secondary hyperparathyroidism of renal origin: Secondary | ICD-10-CM | POA: Diagnosis not present

## 2019-09-09 DIAGNOSIS — D689 Coagulation defect, unspecified: Secondary | ICD-10-CM | POA: Diagnosis not present

## 2019-09-09 DIAGNOSIS — N186 End stage renal disease: Secondary | ICD-10-CM | POA: Diagnosis not present

## 2019-09-09 DIAGNOSIS — E1122 Type 2 diabetes mellitus with diabetic chronic kidney disease: Secondary | ICD-10-CM | POA: Diagnosis not present

## 2019-09-11 DIAGNOSIS — Z992 Dependence on renal dialysis: Secondary | ICD-10-CM | POA: Diagnosis not present

## 2019-09-11 DIAGNOSIS — N186 End stage renal disease: Secondary | ICD-10-CM | POA: Diagnosis not present

## 2019-09-11 DIAGNOSIS — N2581 Secondary hyperparathyroidism of renal origin: Secondary | ICD-10-CM | POA: Diagnosis not present

## 2019-09-11 DIAGNOSIS — D689 Coagulation defect, unspecified: Secondary | ICD-10-CM | POA: Diagnosis not present

## 2019-09-11 DIAGNOSIS — E1122 Type 2 diabetes mellitus with diabetic chronic kidney disease: Secondary | ICD-10-CM | POA: Diagnosis not present

## 2019-09-11 DIAGNOSIS — D631 Anemia in chronic kidney disease: Secondary | ICD-10-CM | POA: Diagnosis not present

## 2019-09-13 DIAGNOSIS — Z992 Dependence on renal dialysis: Secondary | ICD-10-CM | POA: Diagnosis not present

## 2019-09-13 DIAGNOSIS — N186 End stage renal disease: Secondary | ICD-10-CM | POA: Diagnosis not present

## 2019-09-13 DIAGNOSIS — E1122 Type 2 diabetes mellitus with diabetic chronic kidney disease: Secondary | ICD-10-CM | POA: Diagnosis not present

## 2019-09-13 DIAGNOSIS — N2581 Secondary hyperparathyroidism of renal origin: Secondary | ICD-10-CM | POA: Diagnosis not present

## 2019-09-13 DIAGNOSIS — D689 Coagulation defect, unspecified: Secondary | ICD-10-CM | POA: Diagnosis not present

## 2019-09-13 DIAGNOSIS — D631 Anemia in chronic kidney disease: Secondary | ICD-10-CM | POA: Diagnosis not present

## 2019-09-16 DIAGNOSIS — N2581 Secondary hyperparathyroidism of renal origin: Secondary | ICD-10-CM | POA: Diagnosis not present

## 2019-09-16 DIAGNOSIS — D631 Anemia in chronic kidney disease: Secondary | ICD-10-CM | POA: Diagnosis not present

## 2019-09-16 DIAGNOSIS — E1122 Type 2 diabetes mellitus with diabetic chronic kidney disease: Secondary | ICD-10-CM | POA: Diagnosis not present

## 2019-09-16 DIAGNOSIS — Z992 Dependence on renal dialysis: Secondary | ICD-10-CM | POA: Diagnosis not present

## 2019-09-16 DIAGNOSIS — N186 End stage renal disease: Secondary | ICD-10-CM | POA: Diagnosis not present

## 2019-09-16 DIAGNOSIS — D689 Coagulation defect, unspecified: Secondary | ICD-10-CM | POA: Diagnosis not present

## 2019-09-18 DIAGNOSIS — D689 Coagulation defect, unspecified: Secondary | ICD-10-CM | POA: Diagnosis not present

## 2019-09-18 DIAGNOSIS — N186 End stage renal disease: Secondary | ICD-10-CM | POA: Diagnosis not present

## 2019-09-18 DIAGNOSIS — D631 Anemia in chronic kidney disease: Secondary | ICD-10-CM | POA: Diagnosis not present

## 2019-09-18 DIAGNOSIS — E1122 Type 2 diabetes mellitus with diabetic chronic kidney disease: Secondary | ICD-10-CM | POA: Diagnosis not present

## 2019-09-18 DIAGNOSIS — Z992 Dependence on renal dialysis: Secondary | ICD-10-CM | POA: Diagnosis not present

## 2019-09-18 DIAGNOSIS — N2581 Secondary hyperparathyroidism of renal origin: Secondary | ICD-10-CM | POA: Diagnosis not present

## 2019-09-20 DIAGNOSIS — N186 End stage renal disease: Secondary | ICD-10-CM | POA: Diagnosis not present

## 2019-09-20 DIAGNOSIS — D631 Anemia in chronic kidney disease: Secondary | ICD-10-CM | POA: Diagnosis not present

## 2019-09-20 DIAGNOSIS — D689 Coagulation defect, unspecified: Secondary | ICD-10-CM | POA: Diagnosis not present

## 2019-09-20 DIAGNOSIS — E1122 Type 2 diabetes mellitus with diabetic chronic kidney disease: Secondary | ICD-10-CM | POA: Diagnosis not present

## 2019-09-20 DIAGNOSIS — Z992 Dependence on renal dialysis: Secondary | ICD-10-CM | POA: Diagnosis not present

## 2019-09-20 DIAGNOSIS — N2581 Secondary hyperparathyroidism of renal origin: Secondary | ICD-10-CM | POA: Diagnosis not present

## 2019-09-22 DIAGNOSIS — E1129 Type 2 diabetes mellitus with other diabetic kidney complication: Secondary | ICD-10-CM | POA: Diagnosis not present

## 2019-09-22 DIAGNOSIS — N186 End stage renal disease: Secondary | ICD-10-CM | POA: Diagnosis not present

## 2019-09-22 DIAGNOSIS — Z992 Dependence on renal dialysis: Secondary | ICD-10-CM | POA: Diagnosis not present

## 2019-09-23 ENCOUNTER — Other Ambulatory Visit: Payer: Self-pay | Admitting: Nurse Practitioner

## 2019-09-23 DIAGNOSIS — N186 End stage renal disease: Secondary | ICD-10-CM | POA: Diagnosis not present

## 2019-09-23 DIAGNOSIS — E1122 Type 2 diabetes mellitus with diabetic chronic kidney disease: Secondary | ICD-10-CM | POA: Diagnosis not present

## 2019-09-23 DIAGNOSIS — D631 Anemia in chronic kidney disease: Secondary | ICD-10-CM | POA: Diagnosis not present

## 2019-09-23 DIAGNOSIS — Z992 Dependence on renal dialysis: Secondary | ICD-10-CM | POA: Diagnosis not present

## 2019-09-23 DIAGNOSIS — N2581 Secondary hyperparathyroidism of renal origin: Secondary | ICD-10-CM | POA: Diagnosis not present

## 2019-09-23 DIAGNOSIS — R519 Headache, unspecified: Secondary | ICD-10-CM | POA: Diagnosis not present

## 2019-09-23 DIAGNOSIS — D689 Coagulation defect, unspecified: Secondary | ICD-10-CM | POA: Diagnosis not present

## 2019-09-25 DIAGNOSIS — N2581 Secondary hyperparathyroidism of renal origin: Secondary | ICD-10-CM | POA: Diagnosis not present

## 2019-09-25 DIAGNOSIS — E1122 Type 2 diabetes mellitus with diabetic chronic kidney disease: Secondary | ICD-10-CM | POA: Diagnosis not present

## 2019-09-25 DIAGNOSIS — N186 End stage renal disease: Secondary | ICD-10-CM | POA: Diagnosis not present

## 2019-09-25 DIAGNOSIS — D689 Coagulation defect, unspecified: Secondary | ICD-10-CM | POA: Diagnosis not present

## 2019-09-25 DIAGNOSIS — Z992 Dependence on renal dialysis: Secondary | ICD-10-CM | POA: Diagnosis not present

## 2019-09-25 DIAGNOSIS — R519 Headache, unspecified: Secondary | ICD-10-CM | POA: Diagnosis not present

## 2019-09-25 DIAGNOSIS — D631 Anemia in chronic kidney disease: Secondary | ICD-10-CM | POA: Diagnosis not present

## 2019-09-27 ENCOUNTER — Other Ambulatory Visit: Payer: Self-pay | Admitting: Nurse Practitioner

## 2019-09-27 DIAGNOSIS — N2581 Secondary hyperparathyroidism of renal origin: Secondary | ICD-10-CM | POA: Diagnosis not present

## 2019-09-27 DIAGNOSIS — N186 End stage renal disease: Secondary | ICD-10-CM | POA: Diagnosis not present

## 2019-09-27 DIAGNOSIS — E1122 Type 2 diabetes mellitus with diabetic chronic kidney disease: Secondary | ICD-10-CM | POA: Diagnosis not present

## 2019-09-27 DIAGNOSIS — Z992 Dependence on renal dialysis: Secondary | ICD-10-CM | POA: Diagnosis not present

## 2019-09-27 DIAGNOSIS — D631 Anemia in chronic kidney disease: Secondary | ICD-10-CM | POA: Diagnosis not present

## 2019-09-27 DIAGNOSIS — D689 Coagulation defect, unspecified: Secondary | ICD-10-CM | POA: Diagnosis not present

## 2019-09-27 DIAGNOSIS — I639 Cerebral infarction, unspecified: Secondary | ICD-10-CM

## 2019-09-27 DIAGNOSIS — R519 Headache, unspecified: Secondary | ICD-10-CM | POA: Diagnosis not present

## 2019-09-30 DIAGNOSIS — D689 Coagulation defect, unspecified: Secondary | ICD-10-CM | POA: Diagnosis not present

## 2019-09-30 DIAGNOSIS — N2581 Secondary hyperparathyroidism of renal origin: Secondary | ICD-10-CM | POA: Diagnosis not present

## 2019-09-30 DIAGNOSIS — Z992 Dependence on renal dialysis: Secondary | ICD-10-CM | POA: Diagnosis not present

## 2019-09-30 DIAGNOSIS — D631 Anemia in chronic kidney disease: Secondary | ICD-10-CM | POA: Diagnosis not present

## 2019-09-30 DIAGNOSIS — E1122 Type 2 diabetes mellitus with diabetic chronic kidney disease: Secondary | ICD-10-CM | POA: Diagnosis not present

## 2019-09-30 DIAGNOSIS — R519 Headache, unspecified: Secondary | ICD-10-CM | POA: Diagnosis not present

## 2019-09-30 DIAGNOSIS — N186 End stage renal disease: Secondary | ICD-10-CM | POA: Diagnosis not present

## 2019-10-02 DIAGNOSIS — E1122 Type 2 diabetes mellitus with diabetic chronic kidney disease: Secondary | ICD-10-CM | POA: Diagnosis not present

## 2019-10-02 DIAGNOSIS — D631 Anemia in chronic kidney disease: Secondary | ICD-10-CM | POA: Diagnosis not present

## 2019-10-02 DIAGNOSIS — N186 End stage renal disease: Secondary | ICD-10-CM | POA: Diagnosis not present

## 2019-10-02 DIAGNOSIS — D689 Coagulation defect, unspecified: Secondary | ICD-10-CM | POA: Diagnosis not present

## 2019-10-02 DIAGNOSIS — N2581 Secondary hyperparathyroidism of renal origin: Secondary | ICD-10-CM | POA: Diagnosis not present

## 2019-10-02 DIAGNOSIS — R519 Headache, unspecified: Secondary | ICD-10-CM | POA: Diagnosis not present

## 2019-10-02 DIAGNOSIS — Z992 Dependence on renal dialysis: Secondary | ICD-10-CM | POA: Diagnosis not present

## 2019-10-04 DIAGNOSIS — D689 Coagulation defect, unspecified: Secondary | ICD-10-CM | POA: Diagnosis not present

## 2019-10-04 DIAGNOSIS — R519 Headache, unspecified: Secondary | ICD-10-CM | POA: Diagnosis not present

## 2019-10-04 DIAGNOSIS — Z992 Dependence on renal dialysis: Secondary | ICD-10-CM | POA: Diagnosis not present

## 2019-10-04 DIAGNOSIS — N2581 Secondary hyperparathyroidism of renal origin: Secondary | ICD-10-CM | POA: Diagnosis not present

## 2019-10-04 DIAGNOSIS — D631 Anemia in chronic kidney disease: Secondary | ICD-10-CM | POA: Diagnosis not present

## 2019-10-04 DIAGNOSIS — N186 End stage renal disease: Secondary | ICD-10-CM | POA: Diagnosis not present

## 2019-10-04 DIAGNOSIS — E1122 Type 2 diabetes mellitus with diabetic chronic kidney disease: Secondary | ICD-10-CM | POA: Diagnosis not present

## 2019-10-07 DIAGNOSIS — N186 End stage renal disease: Secondary | ICD-10-CM | POA: Diagnosis not present

## 2019-10-07 DIAGNOSIS — D631 Anemia in chronic kidney disease: Secondary | ICD-10-CM | POA: Diagnosis not present

## 2019-10-07 DIAGNOSIS — D689 Coagulation defect, unspecified: Secondary | ICD-10-CM | POA: Diagnosis not present

## 2019-10-07 DIAGNOSIS — R519 Headache, unspecified: Secondary | ICD-10-CM | POA: Diagnosis not present

## 2019-10-07 DIAGNOSIS — N2581 Secondary hyperparathyroidism of renal origin: Secondary | ICD-10-CM | POA: Diagnosis not present

## 2019-10-07 DIAGNOSIS — Z992 Dependence on renal dialysis: Secondary | ICD-10-CM | POA: Diagnosis not present

## 2019-10-07 DIAGNOSIS — E1122 Type 2 diabetes mellitus with diabetic chronic kidney disease: Secondary | ICD-10-CM | POA: Diagnosis not present

## 2019-10-09 DIAGNOSIS — D631 Anemia in chronic kidney disease: Secondary | ICD-10-CM | POA: Diagnosis not present

## 2019-10-09 DIAGNOSIS — E1122 Type 2 diabetes mellitus with diabetic chronic kidney disease: Secondary | ICD-10-CM | POA: Diagnosis not present

## 2019-10-09 DIAGNOSIS — N186 End stage renal disease: Secondary | ICD-10-CM | POA: Diagnosis not present

## 2019-10-09 DIAGNOSIS — R519 Headache, unspecified: Secondary | ICD-10-CM | POA: Diagnosis not present

## 2019-10-09 DIAGNOSIS — D689 Coagulation defect, unspecified: Secondary | ICD-10-CM | POA: Diagnosis not present

## 2019-10-09 DIAGNOSIS — N2581 Secondary hyperparathyroidism of renal origin: Secondary | ICD-10-CM | POA: Diagnosis not present

## 2019-10-09 DIAGNOSIS — Z992 Dependence on renal dialysis: Secondary | ICD-10-CM | POA: Diagnosis not present

## 2019-10-10 DIAGNOSIS — Z1231 Encounter for screening mammogram for malignant neoplasm of breast: Secondary | ICD-10-CM | POA: Diagnosis not present

## 2019-10-10 DIAGNOSIS — M810 Age-related osteoporosis without current pathological fracture: Secondary | ICD-10-CM | POA: Diagnosis not present

## 2019-10-10 LAB — HM DEXA SCAN

## 2019-10-10 LAB — HM MAMMOGRAPHY

## 2019-10-11 DIAGNOSIS — D631 Anemia in chronic kidney disease: Secondary | ICD-10-CM | POA: Diagnosis not present

## 2019-10-11 DIAGNOSIS — Z992 Dependence on renal dialysis: Secondary | ICD-10-CM | POA: Diagnosis not present

## 2019-10-11 DIAGNOSIS — E1122 Type 2 diabetes mellitus with diabetic chronic kidney disease: Secondary | ICD-10-CM | POA: Diagnosis not present

## 2019-10-11 DIAGNOSIS — R519 Headache, unspecified: Secondary | ICD-10-CM | POA: Diagnosis not present

## 2019-10-11 DIAGNOSIS — N186 End stage renal disease: Secondary | ICD-10-CM | POA: Diagnosis not present

## 2019-10-11 DIAGNOSIS — N2581 Secondary hyperparathyroidism of renal origin: Secondary | ICD-10-CM | POA: Diagnosis not present

## 2019-10-11 DIAGNOSIS — D689 Coagulation defect, unspecified: Secondary | ICD-10-CM | POA: Diagnosis not present

## 2019-10-14 DIAGNOSIS — Z992 Dependence on renal dialysis: Secondary | ICD-10-CM | POA: Diagnosis not present

## 2019-10-14 DIAGNOSIS — N2581 Secondary hyperparathyroidism of renal origin: Secondary | ICD-10-CM | POA: Diagnosis not present

## 2019-10-14 DIAGNOSIS — N186 End stage renal disease: Secondary | ICD-10-CM | POA: Diagnosis not present

## 2019-10-14 DIAGNOSIS — R519 Headache, unspecified: Secondary | ICD-10-CM | POA: Diagnosis not present

## 2019-10-14 DIAGNOSIS — D631 Anemia in chronic kidney disease: Secondary | ICD-10-CM | POA: Diagnosis not present

## 2019-10-14 DIAGNOSIS — E1122 Type 2 diabetes mellitus with diabetic chronic kidney disease: Secondary | ICD-10-CM | POA: Diagnosis not present

## 2019-10-14 DIAGNOSIS — D689 Coagulation defect, unspecified: Secondary | ICD-10-CM | POA: Diagnosis not present

## 2019-10-16 DIAGNOSIS — R519 Headache, unspecified: Secondary | ICD-10-CM | POA: Diagnosis not present

## 2019-10-16 DIAGNOSIS — D689 Coagulation defect, unspecified: Secondary | ICD-10-CM | POA: Diagnosis not present

## 2019-10-16 DIAGNOSIS — D631 Anemia in chronic kidney disease: Secondary | ICD-10-CM | POA: Diagnosis not present

## 2019-10-16 DIAGNOSIS — N186 End stage renal disease: Secondary | ICD-10-CM | POA: Diagnosis not present

## 2019-10-16 DIAGNOSIS — N2581 Secondary hyperparathyroidism of renal origin: Secondary | ICD-10-CM | POA: Diagnosis not present

## 2019-10-16 DIAGNOSIS — E1122 Type 2 diabetes mellitus with diabetic chronic kidney disease: Secondary | ICD-10-CM | POA: Diagnosis not present

## 2019-10-16 DIAGNOSIS — Z992 Dependence on renal dialysis: Secondary | ICD-10-CM | POA: Diagnosis not present

## 2019-10-18 DIAGNOSIS — Z992 Dependence on renal dialysis: Secondary | ICD-10-CM | POA: Diagnosis not present

## 2019-10-18 DIAGNOSIS — D631 Anemia in chronic kidney disease: Secondary | ICD-10-CM | POA: Diagnosis not present

## 2019-10-18 DIAGNOSIS — E1122 Type 2 diabetes mellitus with diabetic chronic kidney disease: Secondary | ICD-10-CM | POA: Diagnosis not present

## 2019-10-18 DIAGNOSIS — N2581 Secondary hyperparathyroidism of renal origin: Secondary | ICD-10-CM | POA: Diagnosis not present

## 2019-10-18 DIAGNOSIS — D689 Coagulation defect, unspecified: Secondary | ICD-10-CM | POA: Diagnosis not present

## 2019-10-18 DIAGNOSIS — R519 Headache, unspecified: Secondary | ICD-10-CM | POA: Diagnosis not present

## 2019-10-18 DIAGNOSIS — N186 End stage renal disease: Secondary | ICD-10-CM | POA: Diagnosis not present

## 2019-10-21 DIAGNOSIS — D631 Anemia in chronic kidney disease: Secondary | ICD-10-CM | POA: Diagnosis not present

## 2019-10-21 DIAGNOSIS — E1122 Type 2 diabetes mellitus with diabetic chronic kidney disease: Secondary | ICD-10-CM | POA: Diagnosis not present

## 2019-10-21 DIAGNOSIS — N2581 Secondary hyperparathyroidism of renal origin: Secondary | ICD-10-CM | POA: Diagnosis not present

## 2019-10-21 DIAGNOSIS — Z992 Dependence on renal dialysis: Secondary | ICD-10-CM | POA: Diagnosis not present

## 2019-10-21 DIAGNOSIS — N186 End stage renal disease: Secondary | ICD-10-CM | POA: Diagnosis not present

## 2019-10-21 DIAGNOSIS — D689 Coagulation defect, unspecified: Secondary | ICD-10-CM | POA: Diagnosis not present

## 2019-10-21 DIAGNOSIS — R519 Headache, unspecified: Secondary | ICD-10-CM | POA: Diagnosis not present

## 2019-10-22 ENCOUNTER — Encounter: Payer: Self-pay | Admitting: Internal Medicine

## 2019-10-22 DIAGNOSIS — N186 End stage renal disease: Secondary | ICD-10-CM | POA: Diagnosis not present

## 2019-10-22 DIAGNOSIS — Z992 Dependence on renal dialysis: Secondary | ICD-10-CM | POA: Diagnosis not present

## 2019-10-22 DIAGNOSIS — E1129 Type 2 diabetes mellitus with other diabetic kidney complication: Secondary | ICD-10-CM | POA: Diagnosis not present

## 2019-10-22 NOTE — Telephone Encounter (Signed)
This encounter was created in error - please disregard.

## 2019-10-23 DIAGNOSIS — N2581 Secondary hyperparathyroidism of renal origin: Secondary | ICD-10-CM | POA: Diagnosis not present

## 2019-10-23 DIAGNOSIS — D509 Iron deficiency anemia, unspecified: Secondary | ICD-10-CM | POA: Diagnosis not present

## 2019-10-23 DIAGNOSIS — E1122 Type 2 diabetes mellitus with diabetic chronic kidney disease: Secondary | ICD-10-CM | POA: Diagnosis not present

## 2019-10-23 DIAGNOSIS — Z992 Dependence on renal dialysis: Secondary | ICD-10-CM | POA: Diagnosis not present

## 2019-10-23 DIAGNOSIS — D689 Coagulation defect, unspecified: Secondary | ICD-10-CM | POA: Diagnosis not present

## 2019-10-23 DIAGNOSIS — N186 End stage renal disease: Secondary | ICD-10-CM | POA: Diagnosis not present

## 2019-10-23 DIAGNOSIS — D631 Anemia in chronic kidney disease: Secondary | ICD-10-CM | POA: Diagnosis not present

## 2019-10-25 DIAGNOSIS — Z992 Dependence on renal dialysis: Secondary | ICD-10-CM | POA: Diagnosis not present

## 2019-10-25 DIAGNOSIS — D689 Coagulation defect, unspecified: Secondary | ICD-10-CM | POA: Diagnosis not present

## 2019-10-25 DIAGNOSIS — E1122 Type 2 diabetes mellitus with diabetic chronic kidney disease: Secondary | ICD-10-CM | POA: Diagnosis not present

## 2019-10-25 DIAGNOSIS — D509 Iron deficiency anemia, unspecified: Secondary | ICD-10-CM | POA: Diagnosis not present

## 2019-10-25 DIAGNOSIS — D631 Anemia in chronic kidney disease: Secondary | ICD-10-CM | POA: Diagnosis not present

## 2019-10-25 DIAGNOSIS — N186 End stage renal disease: Secondary | ICD-10-CM | POA: Diagnosis not present

## 2019-10-25 DIAGNOSIS — N2581 Secondary hyperparathyroidism of renal origin: Secondary | ICD-10-CM | POA: Diagnosis not present

## 2019-10-28 DIAGNOSIS — N2581 Secondary hyperparathyroidism of renal origin: Secondary | ICD-10-CM | POA: Diagnosis not present

## 2019-10-28 DIAGNOSIS — D509 Iron deficiency anemia, unspecified: Secondary | ICD-10-CM | POA: Diagnosis not present

## 2019-10-28 DIAGNOSIS — D631 Anemia in chronic kidney disease: Secondary | ICD-10-CM | POA: Diagnosis not present

## 2019-10-28 DIAGNOSIS — D689 Coagulation defect, unspecified: Secondary | ICD-10-CM | POA: Diagnosis not present

## 2019-10-28 DIAGNOSIS — N186 End stage renal disease: Secondary | ICD-10-CM | POA: Diagnosis not present

## 2019-10-28 DIAGNOSIS — Z992 Dependence on renal dialysis: Secondary | ICD-10-CM | POA: Diagnosis not present

## 2019-10-28 DIAGNOSIS — E1122 Type 2 diabetes mellitus with diabetic chronic kidney disease: Secondary | ICD-10-CM | POA: Diagnosis not present

## 2019-10-30 ENCOUNTER — Telehealth: Payer: Self-pay

## 2019-10-30 ENCOUNTER — Other Ambulatory Visit: Payer: Self-pay | Admitting: *Deleted

## 2019-10-30 DIAGNOSIS — N2581 Secondary hyperparathyroidism of renal origin: Secondary | ICD-10-CM | POA: Diagnosis not present

## 2019-10-30 DIAGNOSIS — E1122 Type 2 diabetes mellitus with diabetic chronic kidney disease: Secondary | ICD-10-CM | POA: Diagnosis not present

## 2019-10-30 DIAGNOSIS — D631 Anemia in chronic kidney disease: Secondary | ICD-10-CM | POA: Diagnosis not present

## 2019-10-30 DIAGNOSIS — D509 Iron deficiency anemia, unspecified: Secondary | ICD-10-CM | POA: Diagnosis not present

## 2019-10-30 DIAGNOSIS — Z992 Dependence on renal dialysis: Secondary | ICD-10-CM | POA: Diagnosis not present

## 2019-10-30 DIAGNOSIS — N186 End stage renal disease: Secondary | ICD-10-CM | POA: Diagnosis not present

## 2019-10-30 DIAGNOSIS — D689 Coagulation defect, unspecified: Secondary | ICD-10-CM | POA: Diagnosis not present

## 2019-10-30 MED ORDER — DENOSUMAB 60 MG/ML ~~LOC~~ SOSY: 60.0000 mg | PREFILLED_SYRINGE | SUBCUTANEOUS | 0 refills | Status: DC

## 2019-10-30 NOTE — Telephone Encounter (Signed)
Spoke with Ms Gammell & [facility] had her confused thinking she was in charge of authorizing prolia.  I explained that Anita([facility]) had already taken care of that process & IF any addition info was need for auth, [facility] would contact [place] via phone, fax or email.  Thanks, Vilinda Blanks

## 2019-10-30 NOTE — Telephone Encounter (Signed)
Patient is concerned about her Prolia injection that is due soon, she call the pharmacy to have them send over the medication, however the pharmacy said she need to call back her to get approval. Please call her for next steps. She state she has been waiting for quite some time to get the shot and would like some answers today is possible.

## 2019-10-30 NOTE — Telephone Encounter (Signed)
Lattie Haw gave me Prolia form to call in Prolia for patient for upcoming appointment.   Lattie Haw had Bone Density attached that patient had done on 10/10/2019 signed by [name] and it stated: "Recommend to start prolia Injections every 6 months due to Decrease Bone Mass-Osteoporosis"  T Score: -3.5  Medication list updated and Rx sent to Pharmacy. Bone Density sent for scanning.

## 2019-11-01 DIAGNOSIS — N186 End stage renal disease: Secondary | ICD-10-CM | POA: Diagnosis not present

## 2019-11-01 DIAGNOSIS — D631 Anemia in chronic kidney disease: Secondary | ICD-10-CM | POA: Diagnosis not present

## 2019-11-01 DIAGNOSIS — Z992 Dependence on renal dialysis: Secondary | ICD-10-CM | POA: Diagnosis not present

## 2019-11-01 DIAGNOSIS — D689 Coagulation defect, unspecified: Secondary | ICD-10-CM | POA: Diagnosis not present

## 2019-11-01 DIAGNOSIS — N2581 Secondary hyperparathyroidism of renal origin: Secondary | ICD-10-CM | POA: Diagnosis not present

## 2019-11-01 DIAGNOSIS — E1122 Type 2 diabetes mellitus with diabetic chronic kidney disease: Secondary | ICD-10-CM | POA: Diagnosis not present

## 2019-11-01 DIAGNOSIS — D509 Iron deficiency anemia, unspecified: Secondary | ICD-10-CM | POA: Diagnosis not present

## 2019-11-04 DIAGNOSIS — N186 End stage renal disease: Secondary | ICD-10-CM | POA: Diagnosis not present

## 2019-11-04 DIAGNOSIS — D631 Anemia in chronic kidney disease: Secondary | ICD-10-CM | POA: Diagnosis not present

## 2019-11-04 DIAGNOSIS — D689 Coagulation defect, unspecified: Secondary | ICD-10-CM | POA: Diagnosis not present

## 2019-11-04 DIAGNOSIS — E1122 Type 2 diabetes mellitus with diabetic chronic kidney disease: Secondary | ICD-10-CM | POA: Diagnosis not present

## 2019-11-04 DIAGNOSIS — Z992 Dependence on renal dialysis: Secondary | ICD-10-CM | POA: Diagnosis not present

## 2019-11-04 DIAGNOSIS — N2581 Secondary hyperparathyroidism of renal origin: Secondary | ICD-10-CM | POA: Diagnosis not present

## 2019-11-04 DIAGNOSIS — D509 Iron deficiency anemia, unspecified: Secondary | ICD-10-CM | POA: Diagnosis not present

## 2019-11-05 ENCOUNTER — Telehealth: Payer: Self-pay

## 2019-11-05 NOTE — Telephone Encounter (Signed)
Patient walked in and spoke with receptionist Kathyrn Lass about her prolia.  [facility] and was told they do not have medication in stock and [facility] needed [facility].  Patient stopped by here for she is confused about the process. Lattie Haw informed the patient that she would speak with me and I will follow-up with patient via phone call. Patient left the office  [facility] to clarify if any additional action is need on our end. Per the pharmacist the patient was told that [facility] will contact her (she is to expect a call from them today) to further discuss her obtaining prolia for they [facility] not [facility]. Per the pharmacist no further action is needed from [place].  I called patient to give a status update. I did not get an answer or a voicemail. I will try to call patient again later.

## 2019-11-06 DIAGNOSIS — D509 Iron deficiency anemia, unspecified: Secondary | ICD-10-CM | POA: Diagnosis not present

## 2019-11-06 DIAGNOSIS — E1122 Type 2 diabetes mellitus with diabetic chronic kidney disease: Secondary | ICD-10-CM | POA: Diagnosis not present

## 2019-11-06 DIAGNOSIS — D689 Coagulation defect, unspecified: Secondary | ICD-10-CM | POA: Diagnosis not present

## 2019-11-06 DIAGNOSIS — N2581 Secondary hyperparathyroidism of renal origin: Secondary | ICD-10-CM | POA: Diagnosis not present

## 2019-11-06 DIAGNOSIS — Z992 Dependence on renal dialysis: Secondary | ICD-10-CM | POA: Diagnosis not present

## 2019-11-06 DIAGNOSIS — D631 Anemia in chronic kidney disease: Secondary | ICD-10-CM | POA: Diagnosis not present

## 2019-11-06 DIAGNOSIS — N186 End stage renal disease: Secondary | ICD-10-CM | POA: Diagnosis not present

## 2019-11-07 ENCOUNTER — Ambulatory Visit: Payer: Medicare Other

## 2019-11-07 NOTE — Telephone Encounter (Signed)
Patient called the office asking when she would hear from someone about her Prolia. I read [name]'s note and told patient pharmacy was supposed to contact her. She stated they have not contacted her, though she was at dialysis yesterday. She states she is at home all day today. I will try to contact the [facility] for a status update and will let the patient know status.

## 2019-11-08 DIAGNOSIS — N2581 Secondary hyperparathyroidism of renal origin: Secondary | ICD-10-CM | POA: Diagnosis not present

## 2019-11-08 DIAGNOSIS — E1122 Type 2 diabetes mellitus with diabetic chronic kidney disease: Secondary | ICD-10-CM | POA: Diagnosis not present

## 2019-11-08 DIAGNOSIS — D689 Coagulation defect, unspecified: Secondary | ICD-10-CM | POA: Diagnosis not present

## 2019-11-08 DIAGNOSIS — D509 Iron deficiency anemia, unspecified: Secondary | ICD-10-CM | POA: Diagnosis not present

## 2019-11-08 DIAGNOSIS — N186 End stage renal disease: Secondary | ICD-10-CM | POA: Diagnosis not present

## 2019-11-08 DIAGNOSIS — D631 Anemia in chronic kidney disease: Secondary | ICD-10-CM | POA: Diagnosis not present

## 2019-11-08 DIAGNOSIS — Z992 Dependence on renal dialysis: Secondary | ICD-10-CM | POA: Diagnosis not present

## 2019-11-10 NOTE — Telephone Encounter (Signed)
Due to being on dialysis she is limited with the medications she can take for this. I have sent to [facility] to see if there is an easier way for her to obtain medication

## 2019-11-10 NOTE — Telephone Encounter (Signed)
Patient called and stated that she does not want to take Prolia any longer. Stated that she has to go through [facility] to get the medication and she just does not want to deal with them. Stated that they are always giving her the run around.   Patient is requesting to switch medication. Please Advise.

## 2019-11-10 NOTE — Telephone Encounter (Signed)
Per Shelton Silvas [facility] & [facility]lia should arrive wed 11/12/19. Ms Guess is sch for prolia inj 11/14/19, Shelton Silvas called to remind pt of her appt.  Thanks for everyone working together, [employer]

## 2019-11-11 DIAGNOSIS — D689 Coagulation defect, unspecified: Secondary | ICD-10-CM | POA: Diagnosis not present

## 2019-11-11 DIAGNOSIS — N186 End stage renal disease: Secondary | ICD-10-CM | POA: Diagnosis not present

## 2019-11-11 DIAGNOSIS — D509 Iron deficiency anemia, unspecified: Secondary | ICD-10-CM | POA: Diagnosis not present

## 2019-11-11 DIAGNOSIS — Z992 Dependence on renal dialysis: Secondary | ICD-10-CM | POA: Diagnosis not present

## 2019-11-11 DIAGNOSIS — E1122 Type 2 diabetes mellitus with diabetic chronic kidney disease: Secondary | ICD-10-CM | POA: Diagnosis not present

## 2019-11-11 DIAGNOSIS — D631 Anemia in chronic kidney disease: Secondary | ICD-10-CM | POA: Diagnosis not present

## 2019-11-11 DIAGNOSIS — N2581 Secondary hyperparathyroidism of renal origin: Secondary | ICD-10-CM | POA: Diagnosis not present

## 2019-11-11 NOTE — Telephone Encounter (Signed)
I called [facility]. It was determined that patient had a 0 copay and that they would mail her Prolia to the [facility] office, to be received by Wednesday, 7/21. Her appointment for her first injection is 7/23. The patient was notified of findings.

## 2019-11-13 DIAGNOSIS — Z992 Dependence on renal dialysis: Secondary | ICD-10-CM | POA: Diagnosis not present

## 2019-11-13 DIAGNOSIS — D509 Iron deficiency anemia, unspecified: Secondary | ICD-10-CM | POA: Diagnosis not present

## 2019-11-13 DIAGNOSIS — N186 End stage renal disease: Secondary | ICD-10-CM | POA: Diagnosis not present

## 2019-11-13 DIAGNOSIS — D689 Coagulation defect, unspecified: Secondary | ICD-10-CM | POA: Diagnosis not present

## 2019-11-13 DIAGNOSIS — E1122 Type 2 diabetes mellitus with diabetic chronic kidney disease: Secondary | ICD-10-CM | POA: Diagnosis not present

## 2019-11-13 DIAGNOSIS — D631 Anemia in chronic kidney disease: Secondary | ICD-10-CM | POA: Diagnosis not present

## 2019-11-13 DIAGNOSIS — N2581 Secondary hyperparathyroidism of renal origin: Secondary | ICD-10-CM | POA: Diagnosis not present

## 2019-11-14 ENCOUNTER — Other Ambulatory Visit: Payer: Self-pay | Admitting: Nurse Practitioner

## 2019-11-14 ENCOUNTER — Other Ambulatory Visit: Payer: Self-pay

## 2019-11-14 ENCOUNTER — Ambulatory Visit (INDEPENDENT_AMBULATORY_CARE_PROVIDER_SITE_OTHER): Payer: Medicare Other | Admitting: *Deleted

## 2019-11-14 DIAGNOSIS — L309 Dermatitis, unspecified: Secondary | ICD-10-CM

## 2019-11-14 DIAGNOSIS — M810 Age-related osteoporosis without current pathological fracture: Secondary | ICD-10-CM | POA: Diagnosis not present

## 2019-11-14 MED ORDER — DENOSUMAB 60 MG/ML ~~LOC~~ SOSY
60.0000 mg | PREFILLED_SYRINGE | Freq: Once | SUBCUTANEOUS | Status: AC
  Administered 2019-11-14: 60 mg via SUBCUTANEOUS

## 2019-11-15 DIAGNOSIS — D631 Anemia in chronic kidney disease: Secondary | ICD-10-CM | POA: Diagnosis not present

## 2019-11-15 DIAGNOSIS — D689 Coagulation defect, unspecified: Secondary | ICD-10-CM | POA: Diagnosis not present

## 2019-11-15 DIAGNOSIS — E1122 Type 2 diabetes mellitus with diabetic chronic kidney disease: Secondary | ICD-10-CM | POA: Diagnosis not present

## 2019-11-15 DIAGNOSIS — Z992 Dependence on renal dialysis: Secondary | ICD-10-CM | POA: Diagnosis not present

## 2019-11-15 DIAGNOSIS — D509 Iron deficiency anemia, unspecified: Secondary | ICD-10-CM | POA: Diagnosis not present

## 2019-11-15 DIAGNOSIS — N186 End stage renal disease: Secondary | ICD-10-CM | POA: Diagnosis not present

## 2019-11-15 DIAGNOSIS — N2581 Secondary hyperparathyroidism of renal origin: Secondary | ICD-10-CM | POA: Diagnosis not present

## 2019-11-18 DIAGNOSIS — D631 Anemia in chronic kidney disease: Secondary | ICD-10-CM | POA: Diagnosis not present

## 2019-11-18 DIAGNOSIS — Z992 Dependence on renal dialysis: Secondary | ICD-10-CM | POA: Diagnosis not present

## 2019-11-18 DIAGNOSIS — D509 Iron deficiency anemia, unspecified: Secondary | ICD-10-CM | POA: Diagnosis not present

## 2019-11-18 DIAGNOSIS — E1122 Type 2 diabetes mellitus with diabetic chronic kidney disease: Secondary | ICD-10-CM | POA: Diagnosis not present

## 2019-11-18 DIAGNOSIS — D689 Coagulation defect, unspecified: Secondary | ICD-10-CM | POA: Diagnosis not present

## 2019-11-18 DIAGNOSIS — N2581 Secondary hyperparathyroidism of renal origin: Secondary | ICD-10-CM | POA: Diagnosis not present

## 2019-11-18 DIAGNOSIS — N186 End stage renal disease: Secondary | ICD-10-CM | POA: Diagnosis not present

## 2019-11-20 DIAGNOSIS — Z992 Dependence on renal dialysis: Secondary | ICD-10-CM | POA: Diagnosis not present

## 2019-11-20 DIAGNOSIS — D509 Iron deficiency anemia, unspecified: Secondary | ICD-10-CM | POA: Diagnosis not present

## 2019-11-20 DIAGNOSIS — E1122 Type 2 diabetes mellitus with diabetic chronic kidney disease: Secondary | ICD-10-CM | POA: Diagnosis not present

## 2019-11-20 DIAGNOSIS — D631 Anemia in chronic kidney disease: Secondary | ICD-10-CM | POA: Diagnosis not present

## 2019-11-20 DIAGNOSIS — D689 Coagulation defect, unspecified: Secondary | ICD-10-CM | POA: Diagnosis not present

## 2019-11-20 DIAGNOSIS — N2581 Secondary hyperparathyroidism of renal origin: Secondary | ICD-10-CM | POA: Diagnosis not present

## 2019-11-20 DIAGNOSIS — N186 End stage renal disease: Secondary | ICD-10-CM | POA: Diagnosis not present

## 2019-11-22 ENCOUNTER — Other Ambulatory Visit: Payer: Self-pay | Admitting: Nurse Practitioner

## 2019-11-22 DIAGNOSIS — E1122 Type 2 diabetes mellitus with diabetic chronic kidney disease: Secondary | ICD-10-CM | POA: Diagnosis not present

## 2019-11-22 DIAGNOSIS — D689 Coagulation defect, unspecified: Secondary | ICD-10-CM | POA: Diagnosis not present

## 2019-11-22 DIAGNOSIS — N2581 Secondary hyperparathyroidism of renal origin: Secondary | ICD-10-CM | POA: Diagnosis not present

## 2019-11-22 DIAGNOSIS — F32A Depression, unspecified: Secondary | ICD-10-CM

## 2019-11-22 DIAGNOSIS — N186 End stage renal disease: Secondary | ICD-10-CM | POA: Diagnosis not present

## 2019-11-22 DIAGNOSIS — D509 Iron deficiency anemia, unspecified: Secondary | ICD-10-CM | POA: Diagnosis not present

## 2019-11-22 DIAGNOSIS — Z992 Dependence on renal dialysis: Secondary | ICD-10-CM | POA: Diagnosis not present

## 2019-11-22 DIAGNOSIS — D631 Anemia in chronic kidney disease: Secondary | ICD-10-CM | POA: Diagnosis not present

## 2019-11-22 DIAGNOSIS — E1129 Type 2 diabetes mellitus with other diabetic kidney complication: Secondary | ICD-10-CM | POA: Diagnosis not present

## 2019-11-24 ENCOUNTER — Telehealth: Payer: Self-pay | Admitting: *Deleted

## 2019-11-24 NOTE — Telephone Encounter (Signed)
Patient called requesting a refill on her Sertraline 50mg  once daily.  Medication is NOT in patient's current medication list.  Patient stated that she IS taking the medication and requesting a refill.   Is this ok to add back and refill Please Advise.

## 2019-11-24 NOTE — Telephone Encounter (Signed)
Its not on her list because she told us she was taking paxil (paroxetine) 40 mg daily and she does not need to be on both medications.

## 2019-11-24 NOTE — Telephone Encounter (Signed)
Patient notified and agreed and will continue on the Paxil.

## 2019-11-25 DIAGNOSIS — D509 Iron deficiency anemia, unspecified: Secondary | ICD-10-CM | POA: Diagnosis not present

## 2019-11-25 DIAGNOSIS — Z992 Dependence on renal dialysis: Secondary | ICD-10-CM | POA: Diagnosis not present

## 2019-11-25 DIAGNOSIS — N186 End stage renal disease: Secondary | ICD-10-CM | POA: Diagnosis not present

## 2019-11-25 DIAGNOSIS — D689 Coagulation defect, unspecified: Secondary | ICD-10-CM | POA: Diagnosis not present

## 2019-11-25 DIAGNOSIS — N2581 Secondary hyperparathyroidism of renal origin: Secondary | ICD-10-CM | POA: Diagnosis not present

## 2019-11-25 DIAGNOSIS — D631 Anemia in chronic kidney disease: Secondary | ICD-10-CM | POA: Diagnosis not present

## 2019-11-25 DIAGNOSIS — E1122 Type 2 diabetes mellitus with diabetic chronic kidney disease: Secondary | ICD-10-CM | POA: Diagnosis not present

## 2019-11-27 DIAGNOSIS — N186 End stage renal disease: Secondary | ICD-10-CM | POA: Diagnosis not present

## 2019-11-27 DIAGNOSIS — D509 Iron deficiency anemia, unspecified: Secondary | ICD-10-CM | POA: Diagnosis not present

## 2019-11-27 DIAGNOSIS — D689 Coagulation defect, unspecified: Secondary | ICD-10-CM | POA: Diagnosis not present

## 2019-11-27 DIAGNOSIS — D631 Anemia in chronic kidney disease: Secondary | ICD-10-CM | POA: Diagnosis not present

## 2019-11-27 DIAGNOSIS — Z992 Dependence on renal dialysis: Secondary | ICD-10-CM | POA: Diagnosis not present

## 2019-11-27 DIAGNOSIS — E1122 Type 2 diabetes mellitus with diabetic chronic kidney disease: Secondary | ICD-10-CM | POA: Diagnosis not present

## 2019-11-27 DIAGNOSIS — N2581 Secondary hyperparathyroidism of renal origin: Secondary | ICD-10-CM | POA: Diagnosis not present

## 2019-11-29 DIAGNOSIS — N2581 Secondary hyperparathyroidism of renal origin: Secondary | ICD-10-CM | POA: Diagnosis not present

## 2019-11-29 DIAGNOSIS — D689 Coagulation defect, unspecified: Secondary | ICD-10-CM | POA: Diagnosis not present

## 2019-11-29 DIAGNOSIS — Z992 Dependence on renal dialysis: Secondary | ICD-10-CM | POA: Diagnosis not present

## 2019-11-29 DIAGNOSIS — D509 Iron deficiency anemia, unspecified: Secondary | ICD-10-CM | POA: Diagnosis not present

## 2019-11-29 DIAGNOSIS — N186 End stage renal disease: Secondary | ICD-10-CM | POA: Diagnosis not present

## 2019-11-29 DIAGNOSIS — D631 Anemia in chronic kidney disease: Secondary | ICD-10-CM | POA: Diagnosis not present

## 2019-11-29 DIAGNOSIS — E1122 Type 2 diabetes mellitus with diabetic chronic kidney disease: Secondary | ICD-10-CM | POA: Diagnosis not present

## 2019-12-02 DIAGNOSIS — E1122 Type 2 diabetes mellitus with diabetic chronic kidney disease: Secondary | ICD-10-CM | POA: Diagnosis not present

## 2019-12-02 DIAGNOSIS — D631 Anemia in chronic kidney disease: Secondary | ICD-10-CM | POA: Diagnosis not present

## 2019-12-02 DIAGNOSIS — N2581 Secondary hyperparathyroidism of renal origin: Secondary | ICD-10-CM | POA: Diagnosis not present

## 2019-12-02 DIAGNOSIS — N186 End stage renal disease: Secondary | ICD-10-CM | POA: Diagnosis not present

## 2019-12-02 DIAGNOSIS — D689 Coagulation defect, unspecified: Secondary | ICD-10-CM | POA: Diagnosis not present

## 2019-12-02 DIAGNOSIS — Z992 Dependence on renal dialysis: Secondary | ICD-10-CM | POA: Diagnosis not present

## 2019-12-02 DIAGNOSIS — D509 Iron deficiency anemia, unspecified: Secondary | ICD-10-CM | POA: Diagnosis not present

## 2019-12-04 DIAGNOSIS — D631 Anemia in chronic kidney disease: Secondary | ICD-10-CM | POA: Diagnosis not present

## 2019-12-04 DIAGNOSIS — D509 Iron deficiency anemia, unspecified: Secondary | ICD-10-CM | POA: Diagnosis not present

## 2019-12-04 DIAGNOSIS — N186 End stage renal disease: Secondary | ICD-10-CM | POA: Diagnosis not present

## 2019-12-04 DIAGNOSIS — Z992 Dependence on renal dialysis: Secondary | ICD-10-CM | POA: Diagnosis not present

## 2019-12-04 DIAGNOSIS — N2581 Secondary hyperparathyroidism of renal origin: Secondary | ICD-10-CM | POA: Diagnosis not present

## 2019-12-04 DIAGNOSIS — D689 Coagulation defect, unspecified: Secondary | ICD-10-CM | POA: Diagnosis not present

## 2019-12-04 DIAGNOSIS — E1122 Type 2 diabetes mellitus with diabetic chronic kidney disease: Secondary | ICD-10-CM | POA: Diagnosis not present

## 2019-12-06 DIAGNOSIS — Z992 Dependence on renal dialysis: Secondary | ICD-10-CM | POA: Diagnosis not present

## 2019-12-06 DIAGNOSIS — D509 Iron deficiency anemia, unspecified: Secondary | ICD-10-CM | POA: Diagnosis not present

## 2019-12-06 DIAGNOSIS — D631 Anemia in chronic kidney disease: Secondary | ICD-10-CM | POA: Diagnosis not present

## 2019-12-06 DIAGNOSIS — D689 Coagulation defect, unspecified: Secondary | ICD-10-CM | POA: Diagnosis not present

## 2019-12-06 DIAGNOSIS — N2581 Secondary hyperparathyroidism of renal origin: Secondary | ICD-10-CM | POA: Diagnosis not present

## 2019-12-06 DIAGNOSIS — N186 End stage renal disease: Secondary | ICD-10-CM | POA: Diagnosis not present

## 2019-12-06 DIAGNOSIS — E1122 Type 2 diabetes mellitus with diabetic chronic kidney disease: Secondary | ICD-10-CM | POA: Diagnosis not present

## 2019-12-09 DIAGNOSIS — Z992 Dependence on renal dialysis: Secondary | ICD-10-CM | POA: Diagnosis not present

## 2019-12-09 DIAGNOSIS — N2581 Secondary hyperparathyroidism of renal origin: Secondary | ICD-10-CM | POA: Diagnosis not present

## 2019-12-09 DIAGNOSIS — D689 Coagulation defect, unspecified: Secondary | ICD-10-CM | POA: Diagnosis not present

## 2019-12-09 DIAGNOSIS — D509 Iron deficiency anemia, unspecified: Secondary | ICD-10-CM | POA: Diagnosis not present

## 2019-12-09 DIAGNOSIS — D631 Anemia in chronic kidney disease: Secondary | ICD-10-CM | POA: Diagnosis not present

## 2019-12-09 DIAGNOSIS — N186 End stage renal disease: Secondary | ICD-10-CM | POA: Diagnosis not present

## 2019-12-09 DIAGNOSIS — E1122 Type 2 diabetes mellitus with diabetic chronic kidney disease: Secondary | ICD-10-CM | POA: Diagnosis not present

## 2019-12-11 DIAGNOSIS — N186 End stage renal disease: Secondary | ICD-10-CM | POA: Diagnosis not present

## 2019-12-11 DIAGNOSIS — D509 Iron deficiency anemia, unspecified: Secondary | ICD-10-CM | POA: Diagnosis not present

## 2019-12-11 DIAGNOSIS — E1122 Type 2 diabetes mellitus with diabetic chronic kidney disease: Secondary | ICD-10-CM | POA: Diagnosis not present

## 2019-12-11 DIAGNOSIS — D631 Anemia in chronic kidney disease: Secondary | ICD-10-CM | POA: Diagnosis not present

## 2019-12-11 DIAGNOSIS — Z992 Dependence on renal dialysis: Secondary | ICD-10-CM | POA: Diagnosis not present

## 2019-12-11 DIAGNOSIS — N2581 Secondary hyperparathyroidism of renal origin: Secondary | ICD-10-CM | POA: Diagnosis not present

## 2019-12-11 DIAGNOSIS — D689 Coagulation defect, unspecified: Secondary | ICD-10-CM | POA: Diagnosis not present

## 2019-12-13 DIAGNOSIS — D631 Anemia in chronic kidney disease: Secondary | ICD-10-CM | POA: Diagnosis not present

## 2019-12-13 DIAGNOSIS — E1122 Type 2 diabetes mellitus with diabetic chronic kidney disease: Secondary | ICD-10-CM | POA: Diagnosis not present

## 2019-12-13 DIAGNOSIS — Z992 Dependence on renal dialysis: Secondary | ICD-10-CM | POA: Diagnosis not present

## 2019-12-13 DIAGNOSIS — D689 Coagulation defect, unspecified: Secondary | ICD-10-CM | POA: Diagnosis not present

## 2019-12-13 DIAGNOSIS — D509 Iron deficiency anemia, unspecified: Secondary | ICD-10-CM | POA: Diagnosis not present

## 2019-12-13 DIAGNOSIS — N2581 Secondary hyperparathyroidism of renal origin: Secondary | ICD-10-CM | POA: Diagnosis not present

## 2019-12-13 DIAGNOSIS — N186 End stage renal disease: Secondary | ICD-10-CM | POA: Diagnosis not present

## 2019-12-16 DIAGNOSIS — D689 Coagulation defect, unspecified: Secondary | ICD-10-CM | POA: Diagnosis not present

## 2019-12-16 DIAGNOSIS — N186 End stage renal disease: Secondary | ICD-10-CM | POA: Diagnosis not present

## 2019-12-16 DIAGNOSIS — D631 Anemia in chronic kidney disease: Secondary | ICD-10-CM | POA: Diagnosis not present

## 2019-12-16 DIAGNOSIS — D509 Iron deficiency anemia, unspecified: Secondary | ICD-10-CM | POA: Diagnosis not present

## 2019-12-16 DIAGNOSIS — E1122 Type 2 diabetes mellitus with diabetic chronic kidney disease: Secondary | ICD-10-CM | POA: Diagnosis not present

## 2019-12-16 DIAGNOSIS — Z992 Dependence on renal dialysis: Secondary | ICD-10-CM | POA: Diagnosis not present

## 2019-12-16 DIAGNOSIS — N2581 Secondary hyperparathyroidism of renal origin: Secondary | ICD-10-CM | POA: Diagnosis not present

## 2019-12-18 DIAGNOSIS — N186 End stage renal disease: Secondary | ICD-10-CM | POA: Diagnosis not present

## 2019-12-18 DIAGNOSIS — D689 Coagulation defect, unspecified: Secondary | ICD-10-CM | POA: Diagnosis not present

## 2019-12-18 DIAGNOSIS — E1122 Type 2 diabetes mellitus with diabetic chronic kidney disease: Secondary | ICD-10-CM | POA: Diagnosis not present

## 2019-12-18 DIAGNOSIS — Z992 Dependence on renal dialysis: Secondary | ICD-10-CM | POA: Diagnosis not present

## 2019-12-18 DIAGNOSIS — N2581 Secondary hyperparathyroidism of renal origin: Secondary | ICD-10-CM | POA: Diagnosis not present

## 2019-12-18 DIAGNOSIS — D509 Iron deficiency anemia, unspecified: Secondary | ICD-10-CM | POA: Diagnosis not present

## 2019-12-18 DIAGNOSIS — D631 Anemia in chronic kidney disease: Secondary | ICD-10-CM | POA: Diagnosis not present

## 2019-12-20 DIAGNOSIS — E1122 Type 2 diabetes mellitus with diabetic chronic kidney disease: Secondary | ICD-10-CM | POA: Diagnosis not present

## 2019-12-20 DIAGNOSIS — D509 Iron deficiency anemia, unspecified: Secondary | ICD-10-CM | POA: Diagnosis not present

## 2019-12-20 DIAGNOSIS — N186 End stage renal disease: Secondary | ICD-10-CM | POA: Diagnosis not present

## 2019-12-20 DIAGNOSIS — D631 Anemia in chronic kidney disease: Secondary | ICD-10-CM | POA: Diagnosis not present

## 2019-12-20 DIAGNOSIS — D689 Coagulation defect, unspecified: Secondary | ICD-10-CM | POA: Diagnosis not present

## 2019-12-20 DIAGNOSIS — Z992 Dependence on renal dialysis: Secondary | ICD-10-CM | POA: Diagnosis not present

## 2019-12-20 DIAGNOSIS — N2581 Secondary hyperparathyroidism of renal origin: Secondary | ICD-10-CM | POA: Diagnosis not present

## 2019-12-23 DIAGNOSIS — E1122 Type 2 diabetes mellitus with diabetic chronic kidney disease: Secondary | ICD-10-CM | POA: Diagnosis not present

## 2019-12-23 DIAGNOSIS — D509 Iron deficiency anemia, unspecified: Secondary | ICD-10-CM | POA: Diagnosis not present

## 2019-12-23 DIAGNOSIS — D689 Coagulation defect, unspecified: Secondary | ICD-10-CM | POA: Diagnosis not present

## 2019-12-23 DIAGNOSIS — N186 End stage renal disease: Secondary | ICD-10-CM | POA: Diagnosis not present

## 2019-12-23 DIAGNOSIS — N2581 Secondary hyperparathyroidism of renal origin: Secondary | ICD-10-CM | POA: Diagnosis not present

## 2019-12-23 DIAGNOSIS — E1129 Type 2 diabetes mellitus with other diabetic kidney complication: Secondary | ICD-10-CM | POA: Diagnosis not present

## 2019-12-23 DIAGNOSIS — Z992 Dependence on renal dialysis: Secondary | ICD-10-CM | POA: Diagnosis not present

## 2019-12-23 DIAGNOSIS — D631 Anemia in chronic kidney disease: Secondary | ICD-10-CM | POA: Diagnosis not present

## 2019-12-25 DIAGNOSIS — D689 Coagulation defect, unspecified: Secondary | ICD-10-CM | POA: Diagnosis not present

## 2019-12-25 DIAGNOSIS — N2581 Secondary hyperparathyroidism of renal origin: Secondary | ICD-10-CM | POA: Diagnosis not present

## 2019-12-25 DIAGNOSIS — D631 Anemia in chronic kidney disease: Secondary | ICD-10-CM | POA: Diagnosis not present

## 2019-12-25 DIAGNOSIS — E1122 Type 2 diabetes mellitus with diabetic chronic kidney disease: Secondary | ICD-10-CM | POA: Diagnosis not present

## 2019-12-25 DIAGNOSIS — Z992 Dependence on renal dialysis: Secondary | ICD-10-CM | POA: Diagnosis not present

## 2019-12-25 DIAGNOSIS — N186 End stage renal disease: Secondary | ICD-10-CM | POA: Diagnosis not present

## 2019-12-27 DIAGNOSIS — D631 Anemia in chronic kidney disease: Secondary | ICD-10-CM | POA: Diagnosis not present

## 2019-12-27 DIAGNOSIS — N2581 Secondary hyperparathyroidism of renal origin: Secondary | ICD-10-CM | POA: Diagnosis not present

## 2019-12-27 DIAGNOSIS — D689 Coagulation defect, unspecified: Secondary | ICD-10-CM | POA: Diagnosis not present

## 2019-12-27 DIAGNOSIS — E1122 Type 2 diabetes mellitus with diabetic chronic kidney disease: Secondary | ICD-10-CM | POA: Diagnosis not present

## 2019-12-27 DIAGNOSIS — Z992 Dependence on renal dialysis: Secondary | ICD-10-CM | POA: Diagnosis not present

## 2019-12-27 DIAGNOSIS — N186 End stage renal disease: Secondary | ICD-10-CM | POA: Diagnosis not present

## 2019-12-30 DIAGNOSIS — E1122 Type 2 diabetes mellitus with diabetic chronic kidney disease: Secondary | ICD-10-CM | POA: Diagnosis not present

## 2019-12-30 DIAGNOSIS — N2581 Secondary hyperparathyroidism of renal origin: Secondary | ICD-10-CM | POA: Diagnosis not present

## 2019-12-30 DIAGNOSIS — D631 Anemia in chronic kidney disease: Secondary | ICD-10-CM | POA: Diagnosis not present

## 2019-12-30 DIAGNOSIS — D689 Coagulation defect, unspecified: Secondary | ICD-10-CM | POA: Diagnosis not present

## 2019-12-30 DIAGNOSIS — N186 End stage renal disease: Secondary | ICD-10-CM | POA: Diagnosis not present

## 2019-12-30 DIAGNOSIS — Z992 Dependence on renal dialysis: Secondary | ICD-10-CM | POA: Diagnosis not present

## 2020-01-01 DIAGNOSIS — Z992 Dependence on renal dialysis: Secondary | ICD-10-CM | POA: Diagnosis not present

## 2020-01-01 DIAGNOSIS — D631 Anemia in chronic kidney disease: Secondary | ICD-10-CM | POA: Diagnosis not present

## 2020-01-01 DIAGNOSIS — N186 End stage renal disease: Secondary | ICD-10-CM | POA: Diagnosis not present

## 2020-01-01 DIAGNOSIS — D689 Coagulation defect, unspecified: Secondary | ICD-10-CM | POA: Diagnosis not present

## 2020-01-01 DIAGNOSIS — E1122 Type 2 diabetes mellitus with diabetic chronic kidney disease: Secondary | ICD-10-CM | POA: Diagnosis not present

## 2020-01-01 DIAGNOSIS — N2581 Secondary hyperparathyroidism of renal origin: Secondary | ICD-10-CM | POA: Diagnosis not present

## 2020-01-03 DIAGNOSIS — N186 End stage renal disease: Secondary | ICD-10-CM | POA: Diagnosis not present

## 2020-01-03 DIAGNOSIS — D689 Coagulation defect, unspecified: Secondary | ICD-10-CM | POA: Diagnosis not present

## 2020-01-03 DIAGNOSIS — N2581 Secondary hyperparathyroidism of renal origin: Secondary | ICD-10-CM | POA: Diagnosis not present

## 2020-01-03 DIAGNOSIS — D631 Anemia in chronic kidney disease: Secondary | ICD-10-CM | POA: Diagnosis not present

## 2020-01-03 DIAGNOSIS — E1122 Type 2 diabetes mellitus with diabetic chronic kidney disease: Secondary | ICD-10-CM | POA: Diagnosis not present

## 2020-01-03 DIAGNOSIS — Z992 Dependence on renal dialysis: Secondary | ICD-10-CM | POA: Diagnosis not present

## 2020-01-06 DIAGNOSIS — D631 Anemia in chronic kidney disease: Secondary | ICD-10-CM | POA: Diagnosis not present

## 2020-01-06 DIAGNOSIS — N2581 Secondary hyperparathyroidism of renal origin: Secondary | ICD-10-CM | POA: Diagnosis not present

## 2020-01-06 DIAGNOSIS — D689 Coagulation defect, unspecified: Secondary | ICD-10-CM | POA: Diagnosis not present

## 2020-01-06 DIAGNOSIS — E1122 Type 2 diabetes mellitus with diabetic chronic kidney disease: Secondary | ICD-10-CM | POA: Diagnosis not present

## 2020-01-06 DIAGNOSIS — Z992 Dependence on renal dialysis: Secondary | ICD-10-CM | POA: Diagnosis not present

## 2020-01-06 DIAGNOSIS — N186 End stage renal disease: Secondary | ICD-10-CM | POA: Diagnosis not present

## 2020-01-08 DIAGNOSIS — Z992 Dependence on renal dialysis: Secondary | ICD-10-CM | POA: Diagnosis not present

## 2020-01-08 DIAGNOSIS — N2581 Secondary hyperparathyroidism of renal origin: Secondary | ICD-10-CM | POA: Diagnosis not present

## 2020-01-08 DIAGNOSIS — D689 Coagulation defect, unspecified: Secondary | ICD-10-CM | POA: Diagnosis not present

## 2020-01-08 DIAGNOSIS — D631 Anemia in chronic kidney disease: Secondary | ICD-10-CM | POA: Diagnosis not present

## 2020-01-08 DIAGNOSIS — N186 End stage renal disease: Secondary | ICD-10-CM | POA: Diagnosis not present

## 2020-01-08 DIAGNOSIS — E1122 Type 2 diabetes mellitus with diabetic chronic kidney disease: Secondary | ICD-10-CM | POA: Diagnosis not present

## 2020-01-10 DIAGNOSIS — Z992 Dependence on renal dialysis: Secondary | ICD-10-CM | POA: Diagnosis not present

## 2020-01-10 DIAGNOSIS — E1122 Type 2 diabetes mellitus with diabetic chronic kidney disease: Secondary | ICD-10-CM | POA: Diagnosis not present

## 2020-01-10 DIAGNOSIS — N2581 Secondary hyperparathyroidism of renal origin: Secondary | ICD-10-CM | POA: Diagnosis not present

## 2020-01-10 DIAGNOSIS — D631 Anemia in chronic kidney disease: Secondary | ICD-10-CM | POA: Diagnosis not present

## 2020-01-10 DIAGNOSIS — D689 Coagulation defect, unspecified: Secondary | ICD-10-CM | POA: Diagnosis not present

## 2020-01-10 DIAGNOSIS — N186 End stage renal disease: Secondary | ICD-10-CM | POA: Diagnosis not present

## 2020-01-13 DIAGNOSIS — N2581 Secondary hyperparathyroidism of renal origin: Secondary | ICD-10-CM | POA: Diagnosis not present

## 2020-01-13 DIAGNOSIS — D689 Coagulation defect, unspecified: Secondary | ICD-10-CM | POA: Diagnosis not present

## 2020-01-13 DIAGNOSIS — D631 Anemia in chronic kidney disease: Secondary | ICD-10-CM | POA: Diagnosis not present

## 2020-01-13 DIAGNOSIS — Z992 Dependence on renal dialysis: Secondary | ICD-10-CM | POA: Diagnosis not present

## 2020-01-13 DIAGNOSIS — N186 End stage renal disease: Secondary | ICD-10-CM | POA: Diagnosis not present

## 2020-01-13 DIAGNOSIS — E1122 Type 2 diabetes mellitus with diabetic chronic kidney disease: Secondary | ICD-10-CM | POA: Diagnosis not present

## 2020-01-15 DIAGNOSIS — D631 Anemia in chronic kidney disease: Secondary | ICD-10-CM | POA: Diagnosis not present

## 2020-01-15 DIAGNOSIS — N2581 Secondary hyperparathyroidism of renal origin: Secondary | ICD-10-CM | POA: Diagnosis not present

## 2020-01-15 DIAGNOSIS — D689 Coagulation defect, unspecified: Secondary | ICD-10-CM | POA: Diagnosis not present

## 2020-01-15 DIAGNOSIS — Z992 Dependence on renal dialysis: Secondary | ICD-10-CM | POA: Diagnosis not present

## 2020-01-15 DIAGNOSIS — N186 End stage renal disease: Secondary | ICD-10-CM | POA: Diagnosis not present

## 2020-01-15 DIAGNOSIS — E1122 Type 2 diabetes mellitus with diabetic chronic kidney disease: Secondary | ICD-10-CM | POA: Diagnosis not present

## 2020-01-17 DIAGNOSIS — E1122 Type 2 diabetes mellitus with diabetic chronic kidney disease: Secondary | ICD-10-CM | POA: Diagnosis not present

## 2020-01-17 DIAGNOSIS — D689 Coagulation defect, unspecified: Secondary | ICD-10-CM | POA: Diagnosis not present

## 2020-01-17 DIAGNOSIS — D631 Anemia in chronic kidney disease: Secondary | ICD-10-CM | POA: Diagnosis not present

## 2020-01-17 DIAGNOSIS — Z992 Dependence on renal dialysis: Secondary | ICD-10-CM | POA: Diagnosis not present

## 2020-01-17 DIAGNOSIS — N2581 Secondary hyperparathyroidism of renal origin: Secondary | ICD-10-CM | POA: Diagnosis not present

## 2020-01-17 DIAGNOSIS — N186 End stage renal disease: Secondary | ICD-10-CM | POA: Diagnosis not present

## 2020-01-20 DIAGNOSIS — E1122 Type 2 diabetes mellitus with diabetic chronic kidney disease: Secondary | ICD-10-CM | POA: Diagnosis not present

## 2020-01-20 DIAGNOSIS — N186 End stage renal disease: Secondary | ICD-10-CM | POA: Diagnosis not present

## 2020-01-20 DIAGNOSIS — D631 Anemia in chronic kidney disease: Secondary | ICD-10-CM | POA: Diagnosis not present

## 2020-01-20 DIAGNOSIS — D689 Coagulation defect, unspecified: Secondary | ICD-10-CM | POA: Diagnosis not present

## 2020-01-20 DIAGNOSIS — N2581 Secondary hyperparathyroidism of renal origin: Secondary | ICD-10-CM | POA: Diagnosis not present

## 2020-01-20 DIAGNOSIS — Z992 Dependence on renal dialysis: Secondary | ICD-10-CM | POA: Diagnosis not present

## 2020-01-22 DIAGNOSIS — E1129 Type 2 diabetes mellitus with other diabetic kidney complication: Secondary | ICD-10-CM | POA: Diagnosis not present

## 2020-01-22 DIAGNOSIS — N186 End stage renal disease: Secondary | ICD-10-CM | POA: Diagnosis not present

## 2020-01-22 DIAGNOSIS — D689 Coagulation defect, unspecified: Secondary | ICD-10-CM | POA: Diagnosis not present

## 2020-01-22 DIAGNOSIS — Z992 Dependence on renal dialysis: Secondary | ICD-10-CM | POA: Diagnosis not present

## 2020-01-22 DIAGNOSIS — N2581 Secondary hyperparathyroidism of renal origin: Secondary | ICD-10-CM | POA: Diagnosis not present

## 2020-01-22 DIAGNOSIS — E1122 Type 2 diabetes mellitus with diabetic chronic kidney disease: Secondary | ICD-10-CM | POA: Diagnosis not present

## 2020-01-22 DIAGNOSIS — D631 Anemia in chronic kidney disease: Secondary | ICD-10-CM | POA: Diagnosis not present

## 2020-01-24 DIAGNOSIS — Z992 Dependence on renal dialysis: Secondary | ICD-10-CM | POA: Diagnosis not present

## 2020-01-24 DIAGNOSIS — D631 Anemia in chronic kidney disease: Secondary | ICD-10-CM | POA: Diagnosis not present

## 2020-01-24 DIAGNOSIS — D509 Iron deficiency anemia, unspecified: Secondary | ICD-10-CM | POA: Diagnosis not present

## 2020-01-24 DIAGNOSIS — D689 Coagulation defect, unspecified: Secondary | ICD-10-CM | POA: Diagnosis not present

## 2020-01-24 DIAGNOSIS — N2581 Secondary hyperparathyroidism of renal origin: Secondary | ICD-10-CM | POA: Diagnosis not present

## 2020-01-24 DIAGNOSIS — E1122 Type 2 diabetes mellitus with diabetic chronic kidney disease: Secondary | ICD-10-CM | POA: Diagnosis not present

## 2020-01-24 DIAGNOSIS — N186 End stage renal disease: Secondary | ICD-10-CM | POA: Diagnosis not present

## 2020-01-27 DIAGNOSIS — Z992 Dependence on renal dialysis: Secondary | ICD-10-CM | POA: Diagnosis not present

## 2020-01-27 DIAGNOSIS — D631 Anemia in chronic kidney disease: Secondary | ICD-10-CM | POA: Diagnosis not present

## 2020-01-27 DIAGNOSIS — D689 Coagulation defect, unspecified: Secondary | ICD-10-CM | POA: Diagnosis not present

## 2020-01-27 DIAGNOSIS — N2581 Secondary hyperparathyroidism of renal origin: Secondary | ICD-10-CM | POA: Diagnosis not present

## 2020-01-27 DIAGNOSIS — E1122 Type 2 diabetes mellitus with diabetic chronic kidney disease: Secondary | ICD-10-CM | POA: Diagnosis not present

## 2020-01-27 DIAGNOSIS — D509 Iron deficiency anemia, unspecified: Secondary | ICD-10-CM | POA: Diagnosis not present

## 2020-01-27 DIAGNOSIS — N186 End stage renal disease: Secondary | ICD-10-CM | POA: Diagnosis not present

## 2020-01-29 DIAGNOSIS — D509 Iron deficiency anemia, unspecified: Secondary | ICD-10-CM | POA: Diagnosis not present

## 2020-01-29 DIAGNOSIS — D689 Coagulation defect, unspecified: Secondary | ICD-10-CM | POA: Diagnosis not present

## 2020-01-29 DIAGNOSIS — N2581 Secondary hyperparathyroidism of renal origin: Secondary | ICD-10-CM | POA: Diagnosis not present

## 2020-01-29 DIAGNOSIS — N186 End stage renal disease: Secondary | ICD-10-CM | POA: Diagnosis not present

## 2020-01-29 DIAGNOSIS — Z992 Dependence on renal dialysis: Secondary | ICD-10-CM | POA: Diagnosis not present

## 2020-01-29 DIAGNOSIS — E1122 Type 2 diabetes mellitus with diabetic chronic kidney disease: Secondary | ICD-10-CM | POA: Diagnosis not present

## 2020-01-29 DIAGNOSIS — D631 Anemia in chronic kidney disease: Secondary | ICD-10-CM | POA: Diagnosis not present

## 2020-01-30 ENCOUNTER — Other Ambulatory Visit: Payer: Self-pay | Admitting: Nurse Practitioner

## 2020-01-30 DIAGNOSIS — G4700 Insomnia, unspecified: Secondary | ICD-10-CM

## 2020-01-30 DIAGNOSIS — I639 Cerebral infarction, unspecified: Secondary | ICD-10-CM

## 2020-01-30 DIAGNOSIS — F32A Depression, unspecified: Secondary | ICD-10-CM

## 2020-01-30 DIAGNOSIS — F419 Anxiety disorder, unspecified: Secondary | ICD-10-CM

## 2020-01-30 NOTE — Telephone Encounter (Signed)
High risk or very high risk warning populated when attempting to refill medication. RX request sent to PCP for review and approval if warranted.   

## 2020-01-31 DIAGNOSIS — N186 End stage renal disease: Secondary | ICD-10-CM | POA: Diagnosis not present

## 2020-01-31 DIAGNOSIS — D509 Iron deficiency anemia, unspecified: Secondary | ICD-10-CM | POA: Diagnosis not present

## 2020-01-31 DIAGNOSIS — E1122 Type 2 diabetes mellitus with diabetic chronic kidney disease: Secondary | ICD-10-CM | POA: Diagnosis not present

## 2020-01-31 DIAGNOSIS — N2581 Secondary hyperparathyroidism of renal origin: Secondary | ICD-10-CM | POA: Diagnosis not present

## 2020-01-31 DIAGNOSIS — D631 Anemia in chronic kidney disease: Secondary | ICD-10-CM | POA: Diagnosis not present

## 2020-01-31 DIAGNOSIS — D689 Coagulation defect, unspecified: Secondary | ICD-10-CM | POA: Diagnosis not present

## 2020-01-31 DIAGNOSIS — Z992 Dependence on renal dialysis: Secondary | ICD-10-CM | POA: Diagnosis not present

## 2020-02-02 DIAGNOSIS — Z961 Presence of intraocular lens: Secondary | ICD-10-CM | POA: Diagnosis not present

## 2020-02-02 DIAGNOSIS — H5213 Myopia, bilateral: Secondary | ICD-10-CM | POA: Diagnosis not present

## 2020-02-02 DIAGNOSIS — H10413 Chronic giant papillary conjunctivitis, bilateral: Secondary | ICD-10-CM | POA: Diagnosis not present

## 2020-02-02 DIAGNOSIS — H26491 Other secondary cataract, right eye: Secondary | ICD-10-CM | POA: Diagnosis not present

## 2020-02-02 DIAGNOSIS — H401131 Primary open-angle glaucoma, bilateral, mild stage: Secondary | ICD-10-CM | POA: Diagnosis not present

## 2020-02-02 LAB — HM DIABETES EYE EXAM

## 2020-02-03 DIAGNOSIS — E1122 Type 2 diabetes mellitus with diabetic chronic kidney disease: Secondary | ICD-10-CM | POA: Diagnosis not present

## 2020-02-03 DIAGNOSIS — D509 Iron deficiency anemia, unspecified: Secondary | ICD-10-CM | POA: Diagnosis not present

## 2020-02-03 DIAGNOSIS — N2581 Secondary hyperparathyroidism of renal origin: Secondary | ICD-10-CM | POA: Diagnosis not present

## 2020-02-03 DIAGNOSIS — D689 Coagulation defect, unspecified: Secondary | ICD-10-CM | POA: Diagnosis not present

## 2020-02-03 DIAGNOSIS — Z992 Dependence on renal dialysis: Secondary | ICD-10-CM | POA: Diagnosis not present

## 2020-02-03 DIAGNOSIS — N186 End stage renal disease: Secondary | ICD-10-CM | POA: Diagnosis not present

## 2020-02-03 DIAGNOSIS — D631 Anemia in chronic kidney disease: Secondary | ICD-10-CM | POA: Diagnosis not present

## 2020-02-05 DIAGNOSIS — D509 Iron deficiency anemia, unspecified: Secondary | ICD-10-CM | POA: Diagnosis not present

## 2020-02-05 DIAGNOSIS — D631 Anemia in chronic kidney disease: Secondary | ICD-10-CM | POA: Diagnosis not present

## 2020-02-05 DIAGNOSIS — N2581 Secondary hyperparathyroidism of renal origin: Secondary | ICD-10-CM | POA: Diagnosis not present

## 2020-02-05 DIAGNOSIS — Z992 Dependence on renal dialysis: Secondary | ICD-10-CM | POA: Diagnosis not present

## 2020-02-05 DIAGNOSIS — D689 Coagulation defect, unspecified: Secondary | ICD-10-CM | POA: Diagnosis not present

## 2020-02-05 DIAGNOSIS — E1122 Type 2 diabetes mellitus with diabetic chronic kidney disease: Secondary | ICD-10-CM | POA: Diagnosis not present

## 2020-02-05 DIAGNOSIS — N186 End stage renal disease: Secondary | ICD-10-CM | POA: Diagnosis not present

## 2020-02-07 DIAGNOSIS — E1122 Type 2 diabetes mellitus with diabetic chronic kidney disease: Secondary | ICD-10-CM | POA: Diagnosis not present

## 2020-02-07 DIAGNOSIS — Z992 Dependence on renal dialysis: Secondary | ICD-10-CM | POA: Diagnosis not present

## 2020-02-07 DIAGNOSIS — N186 End stage renal disease: Secondary | ICD-10-CM | POA: Diagnosis not present

## 2020-02-07 DIAGNOSIS — D689 Coagulation defect, unspecified: Secondary | ICD-10-CM | POA: Diagnosis not present

## 2020-02-07 DIAGNOSIS — D509 Iron deficiency anemia, unspecified: Secondary | ICD-10-CM | POA: Diagnosis not present

## 2020-02-07 DIAGNOSIS — D631 Anemia in chronic kidney disease: Secondary | ICD-10-CM | POA: Diagnosis not present

## 2020-02-07 DIAGNOSIS — N2581 Secondary hyperparathyroidism of renal origin: Secondary | ICD-10-CM | POA: Diagnosis not present

## 2020-02-10 DIAGNOSIS — D631 Anemia in chronic kidney disease: Secondary | ICD-10-CM | POA: Diagnosis not present

## 2020-02-10 DIAGNOSIS — Z992 Dependence on renal dialysis: Secondary | ICD-10-CM | POA: Diagnosis not present

## 2020-02-10 DIAGNOSIS — D509 Iron deficiency anemia, unspecified: Secondary | ICD-10-CM | POA: Diagnosis not present

## 2020-02-10 DIAGNOSIS — D689 Coagulation defect, unspecified: Secondary | ICD-10-CM | POA: Diagnosis not present

## 2020-02-10 DIAGNOSIS — N186 End stage renal disease: Secondary | ICD-10-CM | POA: Diagnosis not present

## 2020-02-10 DIAGNOSIS — N2581 Secondary hyperparathyroidism of renal origin: Secondary | ICD-10-CM | POA: Diagnosis not present

## 2020-02-10 DIAGNOSIS — E1122 Type 2 diabetes mellitus with diabetic chronic kidney disease: Secondary | ICD-10-CM | POA: Diagnosis not present

## 2020-02-12 DIAGNOSIS — E1122 Type 2 diabetes mellitus with diabetic chronic kidney disease: Secondary | ICD-10-CM | POA: Diagnosis not present

## 2020-02-12 DIAGNOSIS — D631 Anemia in chronic kidney disease: Secondary | ICD-10-CM | POA: Diagnosis not present

## 2020-02-12 DIAGNOSIS — D509 Iron deficiency anemia, unspecified: Secondary | ICD-10-CM | POA: Diagnosis not present

## 2020-02-12 DIAGNOSIS — N186 End stage renal disease: Secondary | ICD-10-CM | POA: Diagnosis not present

## 2020-02-12 DIAGNOSIS — Z992 Dependence on renal dialysis: Secondary | ICD-10-CM | POA: Diagnosis not present

## 2020-02-12 DIAGNOSIS — N2581 Secondary hyperparathyroidism of renal origin: Secondary | ICD-10-CM | POA: Diagnosis not present

## 2020-02-12 DIAGNOSIS — D689 Coagulation defect, unspecified: Secondary | ICD-10-CM | POA: Diagnosis not present

## 2020-02-14 DIAGNOSIS — Z992 Dependence on renal dialysis: Secondary | ICD-10-CM | POA: Diagnosis not present

## 2020-02-14 DIAGNOSIS — D689 Coagulation defect, unspecified: Secondary | ICD-10-CM | POA: Diagnosis not present

## 2020-02-14 DIAGNOSIS — D631 Anemia in chronic kidney disease: Secondary | ICD-10-CM | POA: Diagnosis not present

## 2020-02-14 DIAGNOSIS — N186 End stage renal disease: Secondary | ICD-10-CM | POA: Diagnosis not present

## 2020-02-14 DIAGNOSIS — N2581 Secondary hyperparathyroidism of renal origin: Secondary | ICD-10-CM | POA: Diagnosis not present

## 2020-02-14 DIAGNOSIS — E1122 Type 2 diabetes mellitus with diabetic chronic kidney disease: Secondary | ICD-10-CM | POA: Diagnosis not present

## 2020-02-14 DIAGNOSIS — D509 Iron deficiency anemia, unspecified: Secondary | ICD-10-CM | POA: Diagnosis not present

## 2020-02-17 DIAGNOSIS — Z992 Dependence on renal dialysis: Secondary | ICD-10-CM | POA: Diagnosis not present

## 2020-02-17 DIAGNOSIS — D509 Iron deficiency anemia, unspecified: Secondary | ICD-10-CM | POA: Diagnosis not present

## 2020-02-17 DIAGNOSIS — N186 End stage renal disease: Secondary | ICD-10-CM | POA: Diagnosis not present

## 2020-02-17 DIAGNOSIS — D631 Anemia in chronic kidney disease: Secondary | ICD-10-CM | POA: Diagnosis not present

## 2020-02-17 DIAGNOSIS — D689 Coagulation defect, unspecified: Secondary | ICD-10-CM | POA: Diagnosis not present

## 2020-02-17 DIAGNOSIS — E1122 Type 2 diabetes mellitus with diabetic chronic kidney disease: Secondary | ICD-10-CM | POA: Diagnosis not present

## 2020-02-17 DIAGNOSIS — N2581 Secondary hyperparathyroidism of renal origin: Secondary | ICD-10-CM | POA: Diagnosis not present

## 2020-02-19 DIAGNOSIS — D689 Coagulation defect, unspecified: Secondary | ICD-10-CM | POA: Diagnosis not present

## 2020-02-19 DIAGNOSIS — N2581 Secondary hyperparathyroidism of renal origin: Secondary | ICD-10-CM | POA: Diagnosis not present

## 2020-02-19 DIAGNOSIS — Z992 Dependence on renal dialysis: Secondary | ICD-10-CM | POA: Diagnosis not present

## 2020-02-19 DIAGNOSIS — D509 Iron deficiency anemia, unspecified: Secondary | ICD-10-CM | POA: Diagnosis not present

## 2020-02-19 DIAGNOSIS — E1122 Type 2 diabetes mellitus with diabetic chronic kidney disease: Secondary | ICD-10-CM | POA: Diagnosis not present

## 2020-02-19 DIAGNOSIS — N186 End stage renal disease: Secondary | ICD-10-CM | POA: Diagnosis not present

## 2020-02-19 DIAGNOSIS — D631 Anemia in chronic kidney disease: Secondary | ICD-10-CM | POA: Diagnosis not present

## 2020-02-22 DIAGNOSIS — E1129 Type 2 diabetes mellitus with other diabetic kidney complication: Secondary | ICD-10-CM | POA: Diagnosis not present

## 2020-02-22 DIAGNOSIS — N186 End stage renal disease: Secondary | ICD-10-CM | POA: Diagnosis not present

## 2020-02-22 DIAGNOSIS — Z992 Dependence on renal dialysis: Secondary | ICD-10-CM | POA: Diagnosis not present

## 2020-02-23 ENCOUNTER — Ambulatory Visit: Payer: Medicare Other | Admitting: Nurse Practitioner

## 2020-02-23 LAB — COMPREHENSIVE METABOLIC PANEL
Albumin: 4 (ref 3.5–5.0)
Calcium: 8.2 — AB (ref 8.7–10.7)

## 2020-02-23 LAB — BASIC METABOLIC PANEL: Potassium: 4 (ref 3.4–5.3)

## 2020-02-23 LAB — CBC AND DIFFERENTIAL: Hemoglobin: 10.2 — AB (ref 12.0–16.0)

## 2020-02-24 DIAGNOSIS — N186 End stage renal disease: Secondary | ICD-10-CM | POA: Diagnosis not present

## 2020-02-24 DIAGNOSIS — D509 Iron deficiency anemia, unspecified: Secondary | ICD-10-CM | POA: Diagnosis not present

## 2020-02-24 DIAGNOSIS — N2581 Secondary hyperparathyroidism of renal origin: Secondary | ICD-10-CM | POA: Diagnosis not present

## 2020-02-24 DIAGNOSIS — D689 Coagulation defect, unspecified: Secondary | ICD-10-CM | POA: Diagnosis not present

## 2020-02-24 DIAGNOSIS — Z992 Dependence on renal dialysis: Secondary | ICD-10-CM | POA: Diagnosis not present

## 2020-02-24 DIAGNOSIS — E1122 Type 2 diabetes mellitus with diabetic chronic kidney disease: Secondary | ICD-10-CM | POA: Diagnosis not present

## 2020-02-24 DIAGNOSIS — D631 Anemia in chronic kidney disease: Secondary | ICD-10-CM | POA: Diagnosis not present

## 2020-02-26 DIAGNOSIS — D631 Anemia in chronic kidney disease: Secondary | ICD-10-CM | POA: Diagnosis not present

## 2020-02-26 DIAGNOSIS — N2581 Secondary hyperparathyroidism of renal origin: Secondary | ICD-10-CM | POA: Diagnosis not present

## 2020-02-26 DIAGNOSIS — D509 Iron deficiency anemia, unspecified: Secondary | ICD-10-CM | POA: Diagnosis not present

## 2020-02-26 DIAGNOSIS — Z992 Dependence on renal dialysis: Secondary | ICD-10-CM | POA: Diagnosis not present

## 2020-02-26 DIAGNOSIS — N186 End stage renal disease: Secondary | ICD-10-CM | POA: Diagnosis not present

## 2020-02-26 DIAGNOSIS — D689 Coagulation defect, unspecified: Secondary | ICD-10-CM | POA: Diagnosis not present

## 2020-02-26 DIAGNOSIS — E1122 Type 2 diabetes mellitus with diabetic chronic kidney disease: Secondary | ICD-10-CM | POA: Diagnosis not present

## 2020-02-27 ENCOUNTER — Other Ambulatory Visit: Payer: Self-pay | Admitting: Nurse Practitioner

## 2020-02-27 DIAGNOSIS — Z8673 Personal history of transient ischemic attack (TIA), and cerebral infarction without residual deficits: Secondary | ICD-10-CM

## 2020-02-28 DIAGNOSIS — D509 Iron deficiency anemia, unspecified: Secondary | ICD-10-CM | POA: Diagnosis not present

## 2020-02-28 DIAGNOSIS — N186 End stage renal disease: Secondary | ICD-10-CM | POA: Diagnosis not present

## 2020-02-28 DIAGNOSIS — D631 Anemia in chronic kidney disease: Secondary | ICD-10-CM | POA: Diagnosis not present

## 2020-02-28 DIAGNOSIS — Z992 Dependence on renal dialysis: Secondary | ICD-10-CM | POA: Diagnosis not present

## 2020-02-28 DIAGNOSIS — D689 Coagulation defect, unspecified: Secondary | ICD-10-CM | POA: Diagnosis not present

## 2020-02-28 DIAGNOSIS — E1122 Type 2 diabetes mellitus with diabetic chronic kidney disease: Secondary | ICD-10-CM | POA: Diagnosis not present

## 2020-02-28 DIAGNOSIS — N2581 Secondary hyperparathyroidism of renal origin: Secondary | ICD-10-CM | POA: Diagnosis not present

## 2020-03-02 DIAGNOSIS — D689 Coagulation defect, unspecified: Secondary | ICD-10-CM | POA: Diagnosis not present

## 2020-03-02 DIAGNOSIS — Z992 Dependence on renal dialysis: Secondary | ICD-10-CM | POA: Diagnosis not present

## 2020-03-02 DIAGNOSIS — N2581 Secondary hyperparathyroidism of renal origin: Secondary | ICD-10-CM | POA: Diagnosis not present

## 2020-03-02 DIAGNOSIS — D509 Iron deficiency anemia, unspecified: Secondary | ICD-10-CM | POA: Diagnosis not present

## 2020-03-02 DIAGNOSIS — D631 Anemia in chronic kidney disease: Secondary | ICD-10-CM | POA: Diagnosis not present

## 2020-03-02 DIAGNOSIS — E1122 Type 2 diabetes mellitus with diabetic chronic kidney disease: Secondary | ICD-10-CM | POA: Diagnosis not present

## 2020-03-02 DIAGNOSIS — N186 End stage renal disease: Secondary | ICD-10-CM | POA: Diagnosis not present

## 2020-03-03 ENCOUNTER — Other Ambulatory Visit: Payer: Self-pay | Admitting: Nurse Practitioner

## 2020-03-03 ENCOUNTER — Telehealth: Payer: Self-pay

## 2020-03-03 ENCOUNTER — Ambulatory Visit (INDEPENDENT_AMBULATORY_CARE_PROVIDER_SITE_OTHER): Payer: Medicare Other | Admitting: Nurse Practitioner

## 2020-03-03 ENCOUNTER — Other Ambulatory Visit: Payer: Self-pay

## 2020-03-03 ENCOUNTER — Encounter: Payer: Self-pay | Admitting: Nurse Practitioner

## 2020-03-03 VITALS — BP 128/66 | HR 86 | Temp 97.1°F | Ht 65.0 in | Wt 174.0 lb

## 2020-03-03 DIAGNOSIS — Z992 Dependence on renal dialysis: Secondary | ICD-10-CM | POA: Diagnosis not present

## 2020-03-03 DIAGNOSIS — Z66 Do not resuscitate: Secondary | ICD-10-CM

## 2020-03-03 DIAGNOSIS — Z794 Long term (current) use of insulin: Secondary | ICD-10-CM

## 2020-03-03 DIAGNOSIS — D631 Anemia in chronic kidney disease: Secondary | ICD-10-CM

## 2020-03-03 DIAGNOSIS — Z720 Tobacco use: Secondary | ICD-10-CM

## 2020-03-03 DIAGNOSIS — I10 Essential (primary) hypertension: Secondary | ICD-10-CM

## 2020-03-03 DIAGNOSIS — E785 Hyperlipidemia, unspecified: Secondary | ICD-10-CM

## 2020-03-03 DIAGNOSIS — N186 End stage renal disease: Secondary | ICD-10-CM

## 2020-03-03 DIAGNOSIS — M810 Age-related osteoporosis without current pathological fracture: Secondary | ICD-10-CM

## 2020-03-03 DIAGNOSIS — K22719 Barrett's esophagus with dysplasia, unspecified: Secondary | ICD-10-CM

## 2020-03-03 DIAGNOSIS — E1122 Type 2 diabetes mellitus with diabetic chronic kidney disease: Secondary | ICD-10-CM

## 2020-03-03 DIAGNOSIS — G43909 Migraine, unspecified, not intractable, without status migrainosus: Secondary | ICD-10-CM | POA: Diagnosis not present

## 2020-03-03 DIAGNOSIS — N185 Chronic kidney disease, stage 5: Secondary | ICD-10-CM

## 2020-03-03 NOTE — Telephone Encounter (Signed)
I called [facility] and left detailed voicemail (with phone and fax number) requesting last DM eye exam  Awaiting fax or return call

## 2020-03-03 NOTE — Patient Instructions (Signed)
To make sure to schedule appt with gastroenterologist to follow up endoscopy   Follow up in 7-8 months (to coordinate with next prolia injection)

## 2020-03-03 NOTE — Progress Notes (Signed)
Careteam: Patient Care Team: [name], NP as PCP - General (Nurse Practitioner) [name], MD as Consulting Physician (Ophthalmology) [name], MD as Consulting Physician (Nephrology) [name], NP as Nurse Practitioner (Nurse Practitioner)  PLACE OF SERVICE:  [place]  Advanced Directive information    Allergies  Allergen Reactions  . Lisinopril Other (See Comments)    Abnormal Kidney Function   . Pioglitazone Other (See Comments)    REACTION: Desquamation of skin of the palm  . Morphine Other (See Comments)  . Other Nausea And Vomiting  . Morphine And Related Nausea And Vomiting  . Sulfonamide Derivatives Rash    Chief Complaint  Patient presents with  . Medical Management of Chronic Issues    6 month follow-up. discuss need for TDaP, A1c, eye, and foot exam (today)      HPI: Patient is a 74 y.o. female for routine follow up.  Reports she forgot to bring labs from dialysis.  Continues to smoke but reports not as much- 1 ppd or less   Anxiety/depression- previously she was taking zoloft (from our office) and paxil (from nephrology) we stopped zoloft and now on paxil only. Reports mood as been stable. No increase in anxiety or depression. States she feels like her depression in remission and that anxiety is controlled.   Hyperlipidemia- LDL at goal in April on prvastatin 40 mg daily and zetia   DM- diet controlled, A1c was at goal on last labs. Reports she saw Dr [name] Fitch 2 months ago.   Osteoporosis- prolia in office.   Hx of CVA- continues on plavix and asa  barretts esophagus- noted on endoscopy in 2015, was advised to follow up in 2018 however she never did. Reports she is willing to follow up at this time.      Review of Systems:  Review of Systems  Constitutional: Negative for chills, fever and weight loss.  HENT: Negative for tinnitus.   Respiratory: Negative for cough, sputum production and shortness of breath.     Cardiovascular: Negative for chest pain, palpitations and leg swelling.  Gastrointestinal: Negative for abdominal pain, constipation, diarrhea and heartburn.  Genitourinary:       Minimal urination, on HD  Musculoskeletal: Negative for back pain, falls, joint pain and myalgias.  Skin: Negative.   Neurological: Negative for dizziness and headaches.  Psychiatric/Behavioral: Negative for depression and memory loss. The patient does not have insomnia.     Past Medical History:  Diagnosis Date  . Anemia   . Anxiety   . Arthritis    lower back and knees   . Asthma    childhood  . Barrett's esophagus   . Chronic kidney disease   . Coagulation defect ([place])   . Depression   . Diabetes mellitus   . Dysphagia   . Eczema   . End stage renal disease ([place])   . Fluid overload, unspecified   . History of herpes zoster 02/2010   Recovered fully after period of acute herpetic neuralgia tx w/ gabapentin.   Marland Kitchen Hx MRSA infection 2009  . Hypercalcemia   . Hyperlipidemia   . Hypertension   . Moderate protein-calorie malnutrition ([place])   . Neuromuscular disorder (HCC)    chronic pain  . Personal history of colonic polyps 10/17/2010   hyperplastic   Past Surgical History:  Procedure Laterality Date  . ABDOMINAL HYSTERECTOMY     unclear when  . AV FISTULA PLACEMENT Left 02/13/2017   Procedure: ARTERIOVENOUS (AV) GORE-TEX  STRETCH GRAFT INSERTION INTO LEFT ARM;  Surgeon: Elam Dutch, MD;  Location: [facility];  Service: Vascular;  Laterality: Left;  . CATARACT EXTRACTION Bilateral   . thigh surg     left side due to MRSA   Social History:   reports that she has been smoking cigarettes. She has a 40.00 pack-year smoking history. She has never used smokeless tobacco. She reports current alcohol use of about 2.0 standard drinks of alcohol per week. She reports that she does not use drugs.  Family History  Problem Relation Age of Onset  . Hypertension Father   . Kidney disease Father   .  Diabetes Brother   . Colon cancer Neg Hx   . CAD Neg Hx     Medications: Patient's Medications  New Prescriptions   No medications on file  Previous Medications   ASPIRIN 81 MG CHEWABLE TABLET    Chew 1 tablet (81 mg total) by mouth daily.   CALCIUM ACETATE (PHOSLO) 667 MG CAPSULE    Take by mouth 3 (three) times daily with meals.   CINACALCET (SENSIPAR) 30 MG TABLET    Take 30 mg by mouth daily.   CLOPIDOGREL (PLAVIX) 75 MG TABLET    TAKE 1 TABLET (75 MG TOTAL) BY MOUTH DAILY. DO NOT USE BLISTER PACKS   DENOSUMAB (PROLIA) 60 MG/ML SOSY INJECTION    Inject 60 mg into the skin every 6 (six) months.   EZETIMIBE (ZETIA) 10 MG TABLET    TAKE 1 TABLET BY MOUTH EVERY DAY   PAROXETINE (PAXIL) 40 MG TABLET    TAKE 1 TABLET BY MOUTH EVERY DAY   PRAVASTATIN (PRAVACHOL) 40 MG TABLET    TAKE 1 TABLET BY MOUTH EVERYDAY AT BEDTIME   SEVELAMER CARBONATE (RENVELA) 2.4 G PACK    2.4 g 3 (three) times daily with meals.   TRAZODONE (DESYREL) 100 MG TABLET    Take 1 tablet (100 mg total) by mouth at bedtime. For sleep F41.9 , F32.9   TRIAMCINOLONE CREAM (KENALOG) 0.1 %    APPLY 1 APPLICATION TOPICALLY 3 (THREE) TIMES DAILY AS NEEDED (RASH).  Modified Medications   No medications on file  Discontinued Medications   No medications on file    Physical Exam:  Vitals:   03/03/20 0900  BP: 128/66  Pulse: 86  Temp: (!) 97.1 F (36.2 C)  TempSrc: Temporal  SpO2: 96%  Weight: 174 lb (78.9 kg)  Height: 5\' 5"  (1.651 m)   Body mass index is 28.96 kg/m. Wt Readings from Last 3 Encounters:  03/03/20 174 lb (78.9 kg)  08/22/19 180 lb (81.6 kg)  03/07/19 186 lb (84.4 kg)    Physical Exam Constitutional:      General: She is not in acute distress.    Appearance: She is well-developed. She is not diaphoretic.  HENT:     Head: Normocephalic and atraumatic.     Mouth/Throat:     Pharynx: No oropharyngeal exudate.  Eyes:     Conjunctiva/sclera: Conjunctivae normal.     Pupils: Pupils are equal,  round, and reactive to light.  Cardiovascular:     Rate and Rhythm: Normal rate and regular rhythm.     Pulses:          Dorsalis pedis pulses are 2+ on the right side and 2+ on the left side.       Posterior tibial pulses are 2+ on the right side and 2+ on the left side.     Heart sounds:  Normal heart sounds.  Pulmonary:     Effort: Pulmonary effort is normal.     Breath sounds: Normal breath sounds.  Abdominal:     General: Bowel sounds are normal.     Palpations: Abdomen is soft.  Musculoskeletal:        General: No tenderness.     Cervical back: Normal range of motion and neck supple.  Feet:     Right foot:     Protective Sensation: 3 sites tested. 3 sites sensed.     Skin integrity: Skin integrity normal.     Toenail Condition: Right toenails are normal.     Left foot:     Protective Sensation: 3 sites tested. 3 sites sensed.     Skin integrity: Skin integrity normal.     Toenail Condition: Left toenails are normal.  Skin:    General: Skin is warm and dry.  Neurological:     Mental Status: She is alert and oriented to person, place, and time.     Labs reviewed: Basic Metabolic Panel: Recent Labs    08/18/19 0000 08/22/19 0941  K 4.2  --   CALCIUM 8.8  --   TSH  --  1.74   Liver Function Tests: Recent Labs    08/18/19 0000  ALBUMIN 4.2   No results for input(s): LIPASE, AMYLASE in the last 8760 hours. No results for input(s): AMMONIA in the last 8760 hours. CBC: Recent Labs    08/18/19 0000  HGB 10.4*   Lipid Panel: Recent Labs    08/22/19 0941  CHOL 120  HDL 42*  LDLCALC 49  TRIG 229*  CHOLHDL 2.9   TSH: Recent Labs    08/22/19 0941  TSH 1.74   A1C: Lab Results  Component Value Date   HGBA1C 5.2 08/22/2019     Assessment/Plan 1. Do not resuscitate - Do not attempt resuscitation (DNR)  2. Essential hypertension Stable at this time, not currently on medication.   3. Migraine without status migrainosus, not intractable,  unspecified migraine type Stable at this time. Without new or worsening headaches.   4. Barrett's esophagus with dysplasia -educated on importance for GI follow up. She is now agreeable to follow up.  - Ambulatory referral to Gastroenterology  5. Senile osteoporosis -continues on prolia every 6 months.   6. ESRD (end stage renal disease) on dialysis ([place]) Continues on renvela and sensipar. Continues on HD Tuesday, Thursday and Saturday.   7. Tobacco abuse Encouraged cessation at this time.  8. Hyperlipidemia with target LDL less than 70 -LDL t goal on pravastatin 40 mg daily with dietary modification.  - Hepatic Function Panel  9. Anemia due to stage 5 chronic kidney disease, not on chronic dialysis ([place]) -stble, continues to be followed by nephrology.   10. Controlled type 2 diabetes mellitus with chronic kidney disease on chronic dialysis, with long-term current use of insulin ([place]) -Encouraged dietary compliance, routine foot care/monitoring and to keep up with diabetic eye exams through ophthalmology  - Hemoglobin A1c   Next appt: 4 months.  Carlos [employer]. [place], [facility] [phone]

## 2020-03-04 DIAGNOSIS — D631 Anemia in chronic kidney disease: Secondary | ICD-10-CM | POA: Diagnosis not present

## 2020-03-04 DIAGNOSIS — D689 Coagulation defect, unspecified: Secondary | ICD-10-CM | POA: Diagnosis not present

## 2020-03-04 DIAGNOSIS — D509 Iron deficiency anemia, unspecified: Secondary | ICD-10-CM | POA: Diagnosis not present

## 2020-03-04 DIAGNOSIS — N186 End stage renal disease: Secondary | ICD-10-CM | POA: Diagnosis not present

## 2020-03-04 DIAGNOSIS — Z992 Dependence on renal dialysis: Secondary | ICD-10-CM | POA: Diagnosis not present

## 2020-03-04 DIAGNOSIS — E1122 Type 2 diabetes mellitus with diabetic chronic kidney disease: Secondary | ICD-10-CM | POA: Diagnosis not present

## 2020-03-04 DIAGNOSIS — N2581 Secondary hyperparathyroidism of renal origin: Secondary | ICD-10-CM | POA: Diagnosis not present

## 2020-03-04 LAB — HEMOGLOBIN A1C
Hgb A1c MFr Bld: 5.2 % of total Hgb (ref <5.7)
Mean Plasma Glucose: 103 (calc)
eAG (mmol/L): 5.7 (calc)

## 2020-03-04 LAB — HEPATIC FUNCTION PANEL
AG Ratio: 1.9 (calc) (ref 1.0–2.5)
ALT: 13 U/L (ref 6–29)
AST: 17 U/L (ref 10–35)
Albumin: 4.5 g/dL (ref 3.6–5.1)
Alkaline phosphatase (APISO): 58 U/L (ref 37–153)
Bilirubin, Direct: 0.1 mg/dL (ref 0.0–0.2)
Globulin: 2.4 g/dL (calc) (ref 1.9–3.7)
Indirect Bilirubin: 0.3 mg/dL (calc) (ref 0.2–1.2)
Total Bilirubin: 0.4 mg/dL (ref 0.2–1.2)
Total Protein: 6.9 g/dL (ref 6.1–8.1)

## 2020-03-05 ENCOUNTER — Encounter: Payer: Self-pay | Admitting: Nurse Practitioner

## 2020-03-06 DIAGNOSIS — D631 Anemia in chronic kidney disease: Secondary | ICD-10-CM | POA: Diagnosis not present

## 2020-03-06 DIAGNOSIS — D689 Coagulation defect, unspecified: Secondary | ICD-10-CM | POA: Diagnosis not present

## 2020-03-06 DIAGNOSIS — E1122 Type 2 diabetes mellitus with diabetic chronic kidney disease: Secondary | ICD-10-CM | POA: Diagnosis not present

## 2020-03-06 DIAGNOSIS — N2581 Secondary hyperparathyroidism of renal origin: Secondary | ICD-10-CM | POA: Diagnosis not present

## 2020-03-06 DIAGNOSIS — D509 Iron deficiency anemia, unspecified: Secondary | ICD-10-CM | POA: Diagnosis not present

## 2020-03-06 DIAGNOSIS — N186 End stage renal disease: Secondary | ICD-10-CM | POA: Diagnosis not present

## 2020-03-06 DIAGNOSIS — Z992 Dependence on renal dialysis: Secondary | ICD-10-CM | POA: Diagnosis not present

## 2020-03-09 DIAGNOSIS — D631 Anemia in chronic kidney disease: Secondary | ICD-10-CM | POA: Diagnosis not present

## 2020-03-09 DIAGNOSIS — N186 End stage renal disease: Secondary | ICD-10-CM | POA: Diagnosis not present

## 2020-03-09 DIAGNOSIS — E1122 Type 2 diabetes mellitus with diabetic chronic kidney disease: Secondary | ICD-10-CM | POA: Diagnosis not present

## 2020-03-09 DIAGNOSIS — D689 Coagulation defect, unspecified: Secondary | ICD-10-CM | POA: Diagnosis not present

## 2020-03-09 DIAGNOSIS — N2581 Secondary hyperparathyroidism of renal origin: Secondary | ICD-10-CM | POA: Diagnosis not present

## 2020-03-09 DIAGNOSIS — D509 Iron deficiency anemia, unspecified: Secondary | ICD-10-CM | POA: Diagnosis not present

## 2020-03-09 DIAGNOSIS — Z992 Dependence on renal dialysis: Secondary | ICD-10-CM | POA: Diagnosis not present

## 2020-03-11 DIAGNOSIS — N2581 Secondary hyperparathyroidism of renal origin: Secondary | ICD-10-CM | POA: Diagnosis not present

## 2020-03-11 DIAGNOSIS — D631 Anemia in chronic kidney disease: Secondary | ICD-10-CM | POA: Diagnosis not present

## 2020-03-11 DIAGNOSIS — D509 Iron deficiency anemia, unspecified: Secondary | ICD-10-CM | POA: Diagnosis not present

## 2020-03-11 DIAGNOSIS — N186 End stage renal disease: Secondary | ICD-10-CM | POA: Diagnosis not present

## 2020-03-11 DIAGNOSIS — D689 Coagulation defect, unspecified: Secondary | ICD-10-CM | POA: Diagnosis not present

## 2020-03-11 DIAGNOSIS — E1122 Type 2 diabetes mellitus with diabetic chronic kidney disease: Secondary | ICD-10-CM | POA: Diagnosis not present

## 2020-03-11 DIAGNOSIS — Z992 Dependence on renal dialysis: Secondary | ICD-10-CM | POA: Diagnosis not present

## 2020-03-12 ENCOUNTER — Other Ambulatory Visit: Payer: Self-pay

## 2020-03-12 ENCOUNTER — Encounter: Payer: Self-pay | Admitting: Nurse Practitioner

## 2020-03-12 ENCOUNTER — Ambulatory Visit (INDEPENDENT_AMBULATORY_CARE_PROVIDER_SITE_OTHER): Payer: Medicare Other | Admitting: Nurse Practitioner

## 2020-03-12 ENCOUNTER — Telehealth: Payer: Self-pay

## 2020-03-12 DIAGNOSIS — Z0000 Encounter for general adult medical examination without abnormal findings: Secondary | ICD-10-CM | POA: Diagnosis not present

## 2020-03-12 NOTE — Patient Instructions (Signed)
[name] , Thank you for taking time to come for your Medicare Wellness Visit. I appreciate your ongoing commitment to your health goals. Please review the following plan we discussed and let me know if I can assist you in the future.   Screening recommendations/referrals: Colonoscopy up to date, due 2022 Mammogram up to date Bone Density up to date Recommended yearly ophthalmology/optometry visit for glaucoma screening and checkup Recommended yearly dental visit for hygiene and checkup  Vaccinations: Influenza vaccine up to date Pneumococcal vaccine up to date Tdap vaccine RECOMMENDED to get at your local pharmacy Shingles vaccine up to date  CT chest due (due to smoking hx) call [phone] to schedule at [facility] imaging.   Advanced directives: to look over most form and we will complete together in office  Conditions/risks identified: smoking, advance age, diabetes, hypertension  Next appointment: 1 year.    Preventive Care 33 Years and Older, Female Preventive care refers to lifestyle choices and visits with your health care provider that can promote health and wellness. What does preventive care include?  A yearly physical exam. This is also called an annual well check.  Dental exams once or twice a year.  Routine eye exams. Ask your health care provider how often you should have your eyes checked.  Personal lifestyle choices, including:  Daily care of your teeth and gums.  Regular physical activity.  Eating a healthy diet.  Avoiding tobacco and drug use.  Limiting alcohol use.  Practicing safe sex.  Taking low-dose aspirin every day.  Taking vitamin and mineral supplements as recommended by your health care provider. What happens during an annual well check? The services and screenings done by your health care provider during your annual well check will depend on your age, overall health, lifestyle risk factors, and family history of  disease. Counseling  Your health care provider may ask you questions about your:  Alcohol use.  Tobacco use.  Drug use.  Emotional well-being.  Home and relationship well-being.  Sexual activity.  Eating habits.  History of falls.  Memory and ability to understand (cognition).  Work and work Statistician.  Reproductive health. Screening  You may have the following tests or measurements:  Height, weight, and BMI.  Blood pressure.  Lipid and cholesterol levels. These may be checked every 5 years, or more frequently if you are over 50 years old.  Skin check.  Lung cancer screening. You may have this screening every year starting at age 63 if you have a 30-pack-year history of smoking and currently smoke or have quit within the past 15 years.  Fecal occult blood test (FOBT) of the stool. You may have this test every year starting at age 15.  Flexible sigmoidoscopy or colonoscopy. You may have a sigmoidoscopy every 5 years or a colonoscopy every 10 years starting at age 21.  Hepatitis C blood test.  Hepatitis B blood test.  Sexually transmitted disease (STD) testing.  Diabetes screening. This is done by checking your blood sugar (glucose) after you have not eaten for a while (fasting). You may have this done every 1-3 years.  Bone density scan. This is done to screen for osteoporosis. You may have this done starting at age 2.  Mammogram. This may be done every 1-2 years. Talk to your health care provider about how often you should have regular mammograms. Talk with your health care provider about your test results, treatment options, and if necessary, the need for more tests. Vaccines  Your health  care provider may recommend certain vaccines, such as:  Influenza vaccine. This is recommended every year.  Tetanus, diphtheria, and acellular pertussis (Tdap, Td) vaccine. You may need a Td booster every 10 years.  Zoster vaccine. You may need this after age  27.  Pneumococcal 13-valent conjugate (PCV13) vaccine. One dose is recommended after age 74.  Pneumococcal polysaccharide (PPSV23) vaccine. One dose is recommended after age 72. Talk to your health care provider about which screenings and vaccines you need and how often you need them. This information is not intended to replace advice given to you by your health care provider. Make sure you discuss any questions you have with your health care provider. Document Released: 05/07/2015 Document Revised: 12/29/2015 Document Reviewed: 02/09/2015 Elsevier Interactive Patient Education  2017 [place] Prevention in the Home Falls can cause injuries. They can happen to people of all ages. There are many things you can do to make your home safe and to help prevent falls. What can I do on the outside of my home?  Regularly fix the edges of walkways and driveways and fix any cracks.  Remove anything that might make you trip as you walk through a door, such as a raised step or threshold.  Trim any bushes or trees on the path to your home.  Use bright outdoor lighting.  Clear any walking paths of anything that might make someone trip, such as rocks or tools.  Regularly check to see if handrails are loose or broken. Make sure that both sides of any steps have handrails.  Any raised decks and porches should have guardrails on the edges.  Have any leaves, snow, or ice cleared regularly.  Use sand or salt on walking paths during winter.  Clean up any spills in your garage right away. This includes oil or grease spills. What can I do in the bathroom?  Use night lights.  Install grab bars by the toilet and in the tub and shower. Do not use towel bars as grab bars.  Use non-skid mats or decals in the tub or shower.  If you need to sit down in the shower, use a plastic, non-slip stool.  Keep the floor dry. Clean up any water that spills on the floor as soon as it happens.  Remove  soap buildup in the tub or shower regularly.  Attach bath mats securely with double-sided non-slip rug tape.  Do not have throw rugs and other things on the floor that can make you trip. What can I do in the bedroom?  Use night lights.  Make sure that you have a light by your bed that is easy to reach.  Do not use any sheets or blankets that are too big for your bed. They should not hang down onto the floor.  Have a firm chair that has side arms. You can use this for support while you get dressed.  Do not have throw rugs and other things on the floor that can make you trip. What can I do in the kitchen?  Clean up any spills right away.  Avoid walking on wet floors.  Keep items that you use a lot in easy-to-reach places.  If you need to reach something above you, use a strong step stool that has a grab bar.  Keep electrical cords out of the way.  Do not use floor polish or wax that makes floors slippery. If you must use wax, use non-skid floor wax.  Do not have  throw rugs and other things on the floor that can make you trip. What can I do with my stairs?  Do not leave any items on the stairs.  Make sure that there are handrails on both sides of the stairs and use them. Fix handrails that are broken or loose. Make sure that handrails are as long as the stairways.  Check any carpeting to make sure that it is firmly attached to the stairs. Fix any carpet that is loose or worn.  Avoid having throw rugs at the top or bottom of the stairs. If you do have throw rugs, attach them to the floor with carpet tape.  Make sure that you have a light switch at the top of the stairs and the bottom of the stairs. If you do not have them, ask someone to add them for you. What else can I do to help prevent falls?  Wear shoes that:  Do not have high heels.  Have rubber bottoms.  Are comfortable and fit you well.  Are closed at the toe. Do not wear sandals.  If you use a  stepladder:  Make sure that it is fully opened. Do not climb a closed stepladder.  Make sure that both sides of the stepladder are locked into place.  Ask someone to hold it for you, if possible.  Clearly mark and make sure that you can see:  Any grab bars or handrails.  First and last steps.  Where the edge of each step is.  Use tools that help you move around (mobility aids) if they are needed. These include:  Canes.  Walkers.  Scooters.  Crutches.  Turn on the lights when you go into a dark area. Replace any light bulbs as soon as they burn out.  Set up your furniture so you have a clear path. Avoid moving your furniture around.  If any of your floors are uneven, fix them.  If there are any pets around you, be aware of where they are.  Review your medicines with your doctor. Some medicines can make you feel dizzy. This can increase your chance of falling. Ask your doctor what other things that you can do to help prevent falls. This information is not intended to replace advice given to you by your health care provider. Make sure you discuss any questions you have with your health care provider. Document Released: 02/04/2009 Document Revised: 09/16/2015 Document Reviewed: 05/15/2014 Elsevier Interactive Patient Education  2017 [employer].

## 2020-03-12 NOTE — Progress Notes (Signed)
This service is provided via telemedicine  No vital signs collected/recorded due to the encounter was a telemedicine visit.   Location of patient (ex: home, work):  Home  Patient consents to a telephone visit:  Yes, see encounter dated 03/12/2020  Location of the provider (ex: office, home):  [facility]  Name of any referring provider:  N/A  Names of all persons participating in the telemedicine service and their role in the encounter:  [name], Nurse Practitioner, Carroll Kinds, CMA, and patient.   Time spent on call: 8 minutes with medical assistant

## 2020-03-12 NOTE — Telephone Encounter (Signed)
Ms. letisha, yera are scheduled for a virtual visit with your provider today.    Just as we do with appointments in the office, we must obtain your consent to participate.  Your consent will be active for this visit and any virtual visit you may have with one of our providers in the next 365 days.    If you have a MyChart account, I can also send a copy of this consent to you electronically.  All virtual visits are billed to your insurance company just like a traditional visit in the office.  As this is a virtual visit, video technology does not allow for your provider to perform a traditional examination.  This may limit your provider's ability to fully assess your condition.  If your provider identifies any concerns that need to be evaluated in person or the need to arrange testing such as labs, EKG, etc, we will make arrangements to do so.    Although advances in technology are sophisticated, we cannot ensure that it will always work on either your end or our end.  If the connection with a video visit is poor, we may have to switch to a telephone visit.  With either a video or telephone visit, we are not always able to ensure that we have a secure connection.   I need to obtain your verbal consent now.   Are you willing to proceed with your visit today?   HOUDA BRAU has provided verbal consent on 03/12/2020 for a virtual visit (video or telephone).   Carroll Kinds, [facility] 03/12/2020  8:32 AM

## 2020-03-12 NOTE — Progress Notes (Signed)
Subjective:   [name] is a 74 y.o. female who presents for Medicare Annual (Subsequent) preventive examination.  Review of Systems        Objective:    There were no vitals filed for this visit. There is no height or weight on file to calculate BMI.  Advanced Directives 03/12/2020 08/22/2019 03/07/2019 02/21/2019 11/01/2018 07/30/2018 05/09/2018  Does Patient Have a Medical Advance Directive? Yes No Yes No Yes No No  Type of Advance Directive Out of facility DNR (pink MOST or yellow form) - Out of facility DNR (pink MOST or yellow form) - Out of facility DNR (pink MOST or yellow form) - -  Does patient want to make changes to medical advance directive? No - Patient declined - No - Patient declined - No - Patient declined - -  Would patient like information on creating a medical advance directive? - No - Patient declined - No - Patient declined - No - Patient declined No - Patient declined  Pre-existing out of facility DNR order (yellow form or pink MOST form) Yellow form placed in chart (order not valid for inpatient use) - - - Yellow form placed in chart (order not valid for inpatient use) - -    Current Medications (verified) Outpatient Encounter Medications as of 03/12/2020  Medication Sig  . aspirin 81 MG chewable tablet Chew 1 tablet (81 mg total) by mouth daily.  . calcium acetate (PHOSLO) 667 MG capsule Take by mouth 3 (three) times daily with meals.  . cinacalcet (SENSIPAR) 30 MG tablet Take 30 mg by mouth daily.  . clopidogrel (PLAVIX) 75 MG tablet TAKE 1 TABLET (75 MG TOTAL) BY MOUTH DAILY. DO NOT USE BLISTER PACKS  . denosumab (PROLIA) 60 MG/ML SOSY injection Inject 60 mg into the skin every 6 (six) months.  . ezetimibe (ZETIA) 10 MG tablet TAKE 1 TABLET BY MOUTH EVERY DAY  . PARoxetine (PAXIL) 40 MG tablet TAKE 1 TABLET BY MOUTH EVERY DAY  . pravastatin (PRAVACHOL) 40 MG tablet TAKE 1 TABLET BY MOUTH EVERYDAY AT BEDTIME  . sevelamer carbonate (RENVELA) 2.4 g PACK 2.4  g 3 (three) times daily with meals.  . traZODone (DESYREL) 100 MG tablet Take 1 tablet (100 mg total) by mouth at bedtime. For sleep F41.9 , F32.9  . triamcinolone cream (KENALOG) 0.1 % APPLY 1 APPLICATION TOPICALLY 3 (THREE) TIMES DAILY AS NEEDED (RASH).   No facility-administered encounter medications on file as of 03/12/2020.    Allergies (verified) Lisinopril, Pioglitazone, Morphine, Other, Morphine and related, and Sulfonamide derivatives   History: Past Medical History:  Diagnosis Date  . Anemia   . Anxiety   . Arthritis    lower back and knees   . Asthma    childhood  . Barrett's esophagus   . Chronic kidney disease   . Coagulation defect ([place])   . Depression   . Diabetes mellitus   . Dysphagia   . Eczema   . End stage renal disease ([place])   . Fluid overload, unspecified   . History of herpes zoster 02/2010   Recovered fully after period of acute herpetic neuralgia tx w/ gabapentin.   Marland Kitchen Hx MRSA infection 2009  . Hypercalcemia   . Hyperlipidemia   . Hypertension   . Moderate protein-calorie malnutrition ([place])   . Neuromuscular disorder (HCC)    chronic pain  . Personal history of colonic polyps 10/17/2010   hyperplastic   Past Surgical History:  Procedure Laterality Date  . ABDOMINAL  HYSTERECTOMY     unclear when  . AV FISTULA PLACEMENT Left 02/13/2017   Procedure: ARTERIOVENOUS (AV) GORE-TEX STRETCH GRAFT INSERTION INTO LEFT ARM;  Surgeon: Elam Dutch, MD;  Location: [facility];  Service: Vascular;  Laterality: Left;  . CATARACT EXTRACTION Bilateral   . thigh surg     left side due to MRSA   Family History  Problem Relation Age of Onset  . Hypertension Father   . Kidney disease Father   . Diabetes Brother   . Colon cancer Neg Hx   . CAD Neg Hx    Social History   Socioeconomic History  . Marital status: Divorced    Spouse name: Not on file  . Number of children: 0  . Years of education: Not on file  . Highest education level: Not on file    Occupational History  . Occupation: Retired    Fish farm manager: SELF-EMPLOYED  Tobacco Use  . Smoking status: Current Every Day Smoker    Packs/day: 1.00    Years: 40.00    Pack years: 40.00    Types: Cigarettes  . Smokeless tobacco: Never Used  . Tobacco comment: smokes 1 pack of cigerettes a week- off and on  Vaping Use  . Vaping Use: Never used  Substance and Sexual Activity  . Alcohol use: Yes    Alcohol/week: 2.0 standard drinks    Types: 2 Standard drinks or equivalent per week    Comment: wine and beer- occasionally   . Drug use: No  . Sexual activity: Never  Other Topics Concern  . Not on file  Social History Narrative   Financial assistance approved for 100% discount at [facility] and has [facility] card per [employer]   02/10/2010      3 caffeine drinks daily    Social Determinants of Health   Financial Resource Strain:   . Difficulty of Paying Living Expenses: Not on file  Food Insecurity:   . Worried About Charity fundraiser in the Last Year: Not on file  . Ran Out of Food in the Last Year: Not on file  Transportation Needs:   . Lack of Transportation (Medical): Not on file  . Lack of Transportation (Non-Medical): Not on file  Physical Activity:   . Days of Exercise per Week: Not on file  . Minutes of Exercise per Session: Not on file  Stress:   . Feeling of Stress : Not on file  Social Connections:   . Frequency of Communication with Friends and Family: Not on file  . Frequency of Social Gatherings with Friends and Family: Not on file  . Attends Religious Services: Not on file  . Active Member of Clubs or Organizations: Not on file  . Attends Archivist Meetings: Not on file  . Marital Status: Not on file    Tobacco Counseling Ready to quit: Not Answered Counseling given: Not Answered Comment: smokes 1 pack of cigerettes a week- off and on   Clinical Intake:  Pre-visit preparation completed: Yes  Pain : No/denies pain     BMI - recorded:  28 Nutritional Status: BMI 25 -29 Overweight Nutritional Risks: None Diabetes: Yes  How often do you need to have someone help you when you read instructions, pamphlets, or other written materials from your doctor or pharmacy?: 1 - Never  Diabetic? yes         Activities of Daily Living In your present state of health, do you have any difficulty performing the  following activities: 03/12/2020  Hearing? N  Vision? Y  Comment double vision, followed by eye doctor  Difficulty concentrating or making decisions? Y  Walking or climbing stairs? N  Dressing or bathing? N  Doing errands, shopping? N  Preparing Food and eating ? N  Using the Toilet? N  In the past six months, have you accidently leaked urine? Y  Do you have problems with loss of bowel control? N  Managing your Medications? Y  Managing your Finances? N  Housekeeping or managing your Housekeeping? Y  Some recent data might be hidden    Patient Care Team: [name], NP as PCP - General (Nurse Practitioner) [name], MD as Consulting Physician (Ophthalmology) [name], MD as Consulting Physician (Nephrology) [name], NP as Nurse Practitioner (Nurse Practitioner)  Indicate any recent Medical Services you may have received from other than [facility] providers in the past year (date may be approximate).     Assessment:   This is a routine wellness examination for [place].  Hearing/Vision screen  Hearing Screening   125Hz  250Hz  500Hz  1000Hz  2000Hz  3000Hz  4000Hz  6000Hz  8000Hz   Right ear:           Left ear:           Comments: Patient states she had no hearing problems.  Vision Screening Comments: Patient wears glasses.Patient has not had recent eye exam. Patient see Dr. [name] Fitch  Dietary issues and exercise activities discussed: Current Exercise Habits: Home exercise routine, Type of exercise: walking, Time (Minutes): 60, Frequency (Times/Week): 3, Weekly Exercise (Minutes/Week):  180  Goals    . Blood Pressure < 140/90    . HEMOGLOBIN A1C < 7.0    . Increase water intake- especially when blood sugars are high    . LDL CALC < 100    . to stay healthy    . Weight (lb) < 200 lb (90.7 kg)     I would like to keep my weight down.       Depression Screen PHQ 2/9 Scores 03/12/2020 08/22/2019 01/30/2018 01/08/2017 06/07/2016 01/06/2016 01/06/2016  PHQ - 2 Score 0 0 1 2 0 3 2  PHQ- 9 Score - - - 5 - 8 10    Fall Risk Fall Risk  03/12/2020 03/03/2020 08/22/2019 03/07/2019 11/01/2018  Falls in the past year? 0 0 0 0 0  Number falls in past yr: 0 0 0 - 0  Injury with Fall? 0 0 0 - 0  Comment - - - - -  Risk Factor Category  - - - - -  Risk for fall due to : - - - - -  Risk for fall due to: Comment - - - - -  Follow up - - - - -    Any stairs in or around the home? No  If so, are there any without handrails? No  Home free of loose throw rugs in walkways, pet beds, electrical cords, etc? Yes  Adequate lighting in your home to reduce risk of falls? Yes   ASSISTIVE DEVICES UTILIZED TO PREVENT FALLS:  Life alert? No  Use of a cane, walker or w/c? Yes  Grab bars in the bathroom? Yes  Shower chair or bench in shower? No  Elevated toilet seat or a handicapped toilet? No   TIMED UP AND GO:  Was the test performed? No .    Cognitive Function: MMSE - Mini Mental State Exam 03/07/2019 01/30/2018 01/08/2017 12/13/2016 05/23/2016  Orientation to time 5  5 5 5 5   Orientation to Place 5 5 5 5 5   Registration 3 3 3 3 3   Attention/ Calculation 1 5 5 5 5   Recall 2 2 1 1 2   Language- name 2 objects 2 2 2 2 2   Language- repeat 1 1 1 1 1   Language- follow 3 step command 3 3 3 3 3   Language- read & follow direction 1 1 1 1 1   Write a sentence 0 1 1 1 1   Copy design 0 0 0 1 1  Total score 23 28 27 28 29      6CIT Screen 03/12/2020  What Year? 0 points  What month? 0 points  What time? 0 points  Count back from 20 0 points  Months in reverse 4 points  Repeat phrase 4  points  Total Score 8    Immunizations Immunization History  Administered Date(s) Administered  . Hepatitis B, adult 06/21/2017, 07/19/2017, 08/18/2017, 09/11/2019, 10/09/2019  . Hepb-cpg 02/12/2020  . Influenza Split 02/03/2011, 01/12/2012  . Influenza Whole 01/07/2010  . Influenza, High Dose Seasonal PF 01/08/2017, 01/22/2018, 01/16/2019, 01/22/2020  . Influenza,inj,Quad PF,6+ Mos 01/03/2013, 02/12/2014, 01/15/2015, 01/06/2016  . Influenza-Unspecified 01/23/2019  . Moderna SARS-COVID-2 Vaccination 06/05/2019, 07/03/2019  . PFIZER SARS-COV-2 Vaccination 01/08/2020  . Pneumococcal Conjugate-13 04/22/2015  . Pneumococcal Polysaccharide-23 01/12/2012  . Td 11/23/2008  . Zoster 04/24/2014  . Zoster Recombinat (Shingrix) 02/13/2018, 12/03/2019    TDAP status: Due, Education has been provided regarding the importance of this vaccine. Advised may receive this vaccine at local pharmacy or Health Dept. Aware to provide a copy of the vaccination record if obtained from local pharmacy or Health Dept. Verbalized acceptance and understanding. Flu Vaccine status: Up to date Pneumococcal vaccine status: Up to date Covid-19 vaccine status: Completed vaccines  Qualifies for Shingles Vaccine? Yes   Zostavax completed Yes   Shingrix Completed?: Yes  Screening Tests Health Maintenance  Topic Date Due  . TETANUS/TDAP  11/24/2018  . HEMOGLOBIN A1C  06/03/2020  . LIPID PANEL  08/21/2020  . COLONOSCOPY  10/16/2020  . OPHTHALMOLOGY EXAM  02/01/2021  . FOOT EXAM  03/03/2021  . MAMMOGRAM  10/09/2021  . INFLUENZA VACCINE  Completed  . DEXA SCAN  Completed  . COVID-19 Vaccine  Completed  . Hepatitis C Screening  Completed  . PNA vac Low Risk Adult  Completed    Health Maintenance  Health Maintenance Due  Topic Date Due  . TETANUS/TDAP  11/24/2018    Colorectal cancer screening: Completed 2012. Repeat every 10 years Mammogram status: Completed 09/2019. Repeat every year Bone Density  status: Completed 09/2019. Results reflect: Bone density results: OSTEOPOROSIS. Repeat every 2 years.  Lung Cancer Screening: (Low Dose CT Chest recommended if Age 30-80 years, 30 pack-year currently smoking OR have quit w/in 15years.) does qualify.   Lung Cancer Screening Referral: ordered, recommended to call and make appt.   Additional Screening:  Hepatitis C Screening: does qualify; Completed 2014   Vision Screening: Recommended annual ophthalmology exams for early detection of glaucoma and other disorders of the eye. Is the patient up to date with their annual eye exam?  Yes  Who is the provider or what is the name of the office in which the patient attends annual eye exams? [facility] If pt is not established with a provider, would they like to be referred to a provider to establish care? No .   Dental Screening: Recommended annual dental exams for proper oral hygiene  [employer]  Referral / Chronic Care Management: CRR required this visit?  No   CCM required this visit?  No      Plan:     I have personally reviewed and noted the following in the patient's chart:   . Medical and social history . Use of alcohol, tobacco or illicit drugs  . Current medications and supplements . Functional ability and status . Nutritional status . Physical activity . Advanced directives . List of other physicians . Hospitalizations, surgeries, and ER visits in previous 12 months . Vitals . Screenings to include cognitive, depression, and falls . Referrals and appointments  In addition, I have reviewed and discussed with patient certain preventive protocols, quality metrics, and best practice recommendations. A written personalized care plan for preventive services as well as general preventive health recommendations were provided to patient.     [name], NP   03/12/2020    Virtual Visit via Telephone Note  I connected with@ on 03/12/20 at  8:30 AM EST by  telephone and verified that I am speaking with the correct person using two identifiers.  Location: Patient: home Provider: [place]   I discussed the limitations, risks, security and privacy concerns of performing an evaluation and management service by telephone and the availability of in person appointments. I also discussed with the patient that there may be a patient responsible charge related to this service. The patient expressed understanding and agreed to proceed.   I discussed the assessment and treatment plan with the patient. The patient was provided an opportunity to ask questions and all were answered. The patient agreed with the plan and demonstrated an understanding of the instructions.   The patient was advised to call back or seek an in-person evaluation if the symptoms worsen or if the condition fails to improve as anticipated.  I provided 18 minutes of non-face-to-face time during this encounter.  Carlos American. [facility] printed and mailed

## 2020-03-13 DIAGNOSIS — Z992 Dependence on renal dialysis: Secondary | ICD-10-CM | POA: Diagnosis not present

## 2020-03-13 DIAGNOSIS — D509 Iron deficiency anemia, unspecified: Secondary | ICD-10-CM | POA: Diagnosis not present

## 2020-03-13 DIAGNOSIS — D689 Coagulation defect, unspecified: Secondary | ICD-10-CM | POA: Diagnosis not present

## 2020-03-13 DIAGNOSIS — E1122 Type 2 diabetes mellitus with diabetic chronic kidney disease: Secondary | ICD-10-CM | POA: Diagnosis not present

## 2020-03-13 DIAGNOSIS — N186 End stage renal disease: Secondary | ICD-10-CM | POA: Diagnosis not present

## 2020-03-13 DIAGNOSIS — D631 Anemia in chronic kidney disease: Secondary | ICD-10-CM | POA: Diagnosis not present

## 2020-03-13 DIAGNOSIS — N2581 Secondary hyperparathyroidism of renal origin: Secondary | ICD-10-CM | POA: Diagnosis not present

## 2020-03-15 DIAGNOSIS — D689 Coagulation defect, unspecified: Secondary | ICD-10-CM | POA: Diagnosis not present

## 2020-03-15 DIAGNOSIS — D509 Iron deficiency anemia, unspecified: Secondary | ICD-10-CM | POA: Diagnosis not present

## 2020-03-15 DIAGNOSIS — Z992 Dependence on renal dialysis: Secondary | ICD-10-CM | POA: Diagnosis not present

## 2020-03-15 DIAGNOSIS — N2581 Secondary hyperparathyroidism of renal origin: Secondary | ICD-10-CM | POA: Diagnosis not present

## 2020-03-15 DIAGNOSIS — D631 Anemia in chronic kidney disease: Secondary | ICD-10-CM | POA: Diagnosis not present

## 2020-03-15 DIAGNOSIS — E1122 Type 2 diabetes mellitus with diabetic chronic kidney disease: Secondary | ICD-10-CM | POA: Diagnosis not present

## 2020-03-15 DIAGNOSIS — N186 End stage renal disease: Secondary | ICD-10-CM | POA: Diagnosis not present

## 2020-03-17 ENCOUNTER — Other Ambulatory Visit: Payer: Self-pay | Admitting: Nurse Practitioner

## 2020-03-17 DIAGNOSIS — N186 End stage renal disease: Secondary | ICD-10-CM | POA: Diagnosis not present

## 2020-03-17 DIAGNOSIS — Z992 Dependence on renal dialysis: Secondary | ICD-10-CM | POA: Diagnosis not present

## 2020-03-17 DIAGNOSIS — N2581 Secondary hyperparathyroidism of renal origin: Secondary | ICD-10-CM | POA: Diagnosis not present

## 2020-03-17 DIAGNOSIS — D689 Coagulation defect, unspecified: Secondary | ICD-10-CM | POA: Diagnosis not present

## 2020-03-17 DIAGNOSIS — D631 Anemia in chronic kidney disease: Secondary | ICD-10-CM | POA: Diagnosis not present

## 2020-03-17 DIAGNOSIS — D509 Iron deficiency anemia, unspecified: Secondary | ICD-10-CM | POA: Diagnosis not present

## 2020-03-17 DIAGNOSIS — E1122 Type 2 diabetes mellitus with diabetic chronic kidney disease: Secondary | ICD-10-CM | POA: Diagnosis not present

## 2020-03-19 ENCOUNTER — Other Ambulatory Visit: Payer: Self-pay | Admitting: Nurse Practitioner

## 2020-03-19 DIAGNOSIS — I639 Cerebral infarction, unspecified: Secondary | ICD-10-CM

## 2020-03-20 DIAGNOSIS — Z992 Dependence on renal dialysis: Secondary | ICD-10-CM | POA: Diagnosis not present

## 2020-03-20 DIAGNOSIS — D509 Iron deficiency anemia, unspecified: Secondary | ICD-10-CM | POA: Diagnosis not present

## 2020-03-20 DIAGNOSIS — N186 End stage renal disease: Secondary | ICD-10-CM | POA: Diagnosis not present

## 2020-03-20 DIAGNOSIS — D631 Anemia in chronic kidney disease: Secondary | ICD-10-CM | POA: Diagnosis not present

## 2020-03-20 DIAGNOSIS — D689 Coagulation defect, unspecified: Secondary | ICD-10-CM | POA: Diagnosis not present

## 2020-03-20 DIAGNOSIS — E1122 Type 2 diabetes mellitus with diabetic chronic kidney disease: Secondary | ICD-10-CM | POA: Diagnosis not present

## 2020-03-20 DIAGNOSIS — N2581 Secondary hyperparathyroidism of renal origin: Secondary | ICD-10-CM | POA: Diagnosis not present

## 2020-03-22 ENCOUNTER — Telehealth: Payer: Self-pay

## 2020-03-22 DIAGNOSIS — Z23 Encounter for immunization: Secondary | ICD-10-CM

## 2020-03-22 NOTE — Telephone Encounter (Signed)
Patient had an AWV last week and was requested to get Tdap injection, but an order was not sent to pharmacy.

## 2020-03-23 ENCOUNTER — Other Ambulatory Visit: Payer: Self-pay

## 2020-03-23 DIAGNOSIS — Z992 Dependence on renal dialysis: Secondary | ICD-10-CM | POA: Diagnosis not present

## 2020-03-23 DIAGNOSIS — D509 Iron deficiency anemia, unspecified: Secondary | ICD-10-CM | POA: Diagnosis not present

## 2020-03-23 DIAGNOSIS — E1129 Type 2 diabetes mellitus with other diabetic kidney complication: Secondary | ICD-10-CM | POA: Diagnosis not present

## 2020-03-23 DIAGNOSIS — D631 Anemia in chronic kidney disease: Secondary | ICD-10-CM | POA: Diagnosis not present

## 2020-03-23 DIAGNOSIS — D689 Coagulation defect, unspecified: Secondary | ICD-10-CM | POA: Diagnosis not present

## 2020-03-23 DIAGNOSIS — Z23 Encounter for immunization: Secondary | ICD-10-CM

## 2020-03-23 DIAGNOSIS — N2581 Secondary hyperparathyroidism of renal origin: Secondary | ICD-10-CM | POA: Diagnosis not present

## 2020-03-23 DIAGNOSIS — N186 End stage renal disease: Secondary | ICD-10-CM | POA: Diagnosis not present

## 2020-03-23 DIAGNOSIS — E1122 Type 2 diabetes mellitus with diabetic chronic kidney disease: Secondary | ICD-10-CM | POA: Diagnosis not present

## 2020-03-23 MED ORDER — TETANUS-DIPHTH-ACELL PERTUSSIS 5-2.5-18.5 LF-MCG/0.5 IM SUSP: 0.5000 mL | Freq: Once | INTRAMUSCULAR | 0 refills | Status: AC

## 2020-03-25 DIAGNOSIS — Z992 Dependence on renal dialysis: Secondary | ICD-10-CM | POA: Diagnosis not present

## 2020-03-25 DIAGNOSIS — N186 End stage renal disease: Secondary | ICD-10-CM | POA: Diagnosis not present

## 2020-03-25 DIAGNOSIS — E1122 Type 2 diabetes mellitus with diabetic chronic kidney disease: Secondary | ICD-10-CM | POA: Diagnosis not present

## 2020-03-25 DIAGNOSIS — D631 Anemia in chronic kidney disease: Secondary | ICD-10-CM | POA: Diagnosis not present

## 2020-03-25 DIAGNOSIS — D509 Iron deficiency anemia, unspecified: Secondary | ICD-10-CM | POA: Diagnosis not present

## 2020-03-25 DIAGNOSIS — D689 Coagulation defect, unspecified: Secondary | ICD-10-CM | POA: Diagnosis not present

## 2020-03-25 DIAGNOSIS — N2581 Secondary hyperparathyroidism of renal origin: Secondary | ICD-10-CM | POA: Diagnosis not present

## 2020-03-27 DIAGNOSIS — D631 Anemia in chronic kidney disease: Secondary | ICD-10-CM | POA: Diagnosis not present

## 2020-03-27 DIAGNOSIS — D689 Coagulation defect, unspecified: Secondary | ICD-10-CM | POA: Diagnosis not present

## 2020-03-27 DIAGNOSIS — Z992 Dependence on renal dialysis: Secondary | ICD-10-CM | POA: Diagnosis not present

## 2020-03-27 DIAGNOSIS — D509 Iron deficiency anemia, unspecified: Secondary | ICD-10-CM | POA: Diagnosis not present

## 2020-03-27 DIAGNOSIS — N2581 Secondary hyperparathyroidism of renal origin: Secondary | ICD-10-CM | POA: Diagnosis not present

## 2020-03-27 DIAGNOSIS — N186 End stage renal disease: Secondary | ICD-10-CM | POA: Diagnosis not present

## 2020-03-27 DIAGNOSIS — E1122 Type 2 diabetes mellitus with diabetic chronic kidney disease: Secondary | ICD-10-CM | POA: Diagnosis not present

## 2020-03-30 DIAGNOSIS — D689 Coagulation defect, unspecified: Secondary | ICD-10-CM | POA: Diagnosis not present

## 2020-03-30 DIAGNOSIS — D631 Anemia in chronic kidney disease: Secondary | ICD-10-CM | POA: Diagnosis not present

## 2020-03-30 DIAGNOSIS — N2581 Secondary hyperparathyroidism of renal origin: Secondary | ICD-10-CM | POA: Diagnosis not present

## 2020-03-30 DIAGNOSIS — Z992 Dependence on renal dialysis: Secondary | ICD-10-CM | POA: Diagnosis not present

## 2020-03-30 DIAGNOSIS — D509 Iron deficiency anemia, unspecified: Secondary | ICD-10-CM | POA: Diagnosis not present

## 2020-03-30 DIAGNOSIS — E1122 Type 2 diabetes mellitus with diabetic chronic kidney disease: Secondary | ICD-10-CM | POA: Diagnosis not present

## 2020-03-30 DIAGNOSIS — N186 End stage renal disease: Secondary | ICD-10-CM | POA: Diagnosis not present

## 2020-04-01 DIAGNOSIS — D509 Iron deficiency anemia, unspecified: Secondary | ICD-10-CM | POA: Diagnosis not present

## 2020-04-01 DIAGNOSIS — D631 Anemia in chronic kidney disease: Secondary | ICD-10-CM | POA: Diagnosis not present

## 2020-04-01 DIAGNOSIS — N186 End stage renal disease: Secondary | ICD-10-CM | POA: Diagnosis not present

## 2020-04-01 DIAGNOSIS — Z992 Dependence on renal dialysis: Secondary | ICD-10-CM | POA: Diagnosis not present

## 2020-04-01 DIAGNOSIS — E1122 Type 2 diabetes mellitus with diabetic chronic kidney disease: Secondary | ICD-10-CM | POA: Diagnosis not present

## 2020-04-01 DIAGNOSIS — N2581 Secondary hyperparathyroidism of renal origin: Secondary | ICD-10-CM | POA: Diagnosis not present

## 2020-04-01 DIAGNOSIS — D689 Coagulation defect, unspecified: Secondary | ICD-10-CM | POA: Diagnosis not present

## 2020-04-03 DIAGNOSIS — Z992 Dependence on renal dialysis: Secondary | ICD-10-CM | POA: Diagnosis not present

## 2020-04-03 DIAGNOSIS — D509 Iron deficiency anemia, unspecified: Secondary | ICD-10-CM | POA: Diagnosis not present

## 2020-04-03 DIAGNOSIS — D689 Coagulation defect, unspecified: Secondary | ICD-10-CM | POA: Diagnosis not present

## 2020-04-03 DIAGNOSIS — E1122 Type 2 diabetes mellitus with diabetic chronic kidney disease: Secondary | ICD-10-CM | POA: Diagnosis not present

## 2020-04-03 DIAGNOSIS — N2581 Secondary hyperparathyroidism of renal origin: Secondary | ICD-10-CM | POA: Diagnosis not present

## 2020-04-03 DIAGNOSIS — N186 End stage renal disease: Secondary | ICD-10-CM | POA: Diagnosis not present

## 2020-04-03 DIAGNOSIS — D631 Anemia in chronic kidney disease: Secondary | ICD-10-CM | POA: Diagnosis not present

## 2020-04-06 DIAGNOSIS — N186 End stage renal disease: Secondary | ICD-10-CM | POA: Diagnosis not present

## 2020-04-06 DIAGNOSIS — E1122 Type 2 diabetes mellitus with diabetic chronic kidney disease: Secondary | ICD-10-CM | POA: Diagnosis not present

## 2020-04-06 DIAGNOSIS — D509 Iron deficiency anemia, unspecified: Secondary | ICD-10-CM | POA: Diagnosis not present

## 2020-04-06 DIAGNOSIS — N2581 Secondary hyperparathyroidism of renal origin: Secondary | ICD-10-CM | POA: Diagnosis not present

## 2020-04-06 DIAGNOSIS — D631 Anemia in chronic kidney disease: Secondary | ICD-10-CM | POA: Diagnosis not present

## 2020-04-06 DIAGNOSIS — Z992 Dependence on renal dialysis: Secondary | ICD-10-CM | POA: Diagnosis not present

## 2020-04-06 DIAGNOSIS — D689 Coagulation defect, unspecified: Secondary | ICD-10-CM | POA: Diagnosis not present

## 2020-04-08 DIAGNOSIS — E1122 Type 2 diabetes mellitus with diabetic chronic kidney disease: Secondary | ICD-10-CM | POA: Diagnosis not present

## 2020-04-08 DIAGNOSIS — D509 Iron deficiency anemia, unspecified: Secondary | ICD-10-CM | POA: Diagnosis not present

## 2020-04-08 DIAGNOSIS — N186 End stage renal disease: Secondary | ICD-10-CM | POA: Diagnosis not present

## 2020-04-08 DIAGNOSIS — N2581 Secondary hyperparathyroidism of renal origin: Secondary | ICD-10-CM | POA: Diagnosis not present

## 2020-04-08 DIAGNOSIS — D689 Coagulation defect, unspecified: Secondary | ICD-10-CM | POA: Diagnosis not present

## 2020-04-08 DIAGNOSIS — Z992 Dependence on renal dialysis: Secondary | ICD-10-CM | POA: Diagnosis not present

## 2020-04-08 DIAGNOSIS — D631 Anemia in chronic kidney disease: Secondary | ICD-10-CM | POA: Diagnosis not present

## 2020-04-09 ENCOUNTER — Ambulatory Visit
Admission: RE | Admit: 2020-04-09 | Discharge: 2020-04-09 | Disposition: A | Discharge status: Home / Self Care | Payer: Medicare Other | Source: Ambulatory Visit | Attending: Nurse Practitioner | Admitting: Nurse Practitioner

## 2020-04-09 ENCOUNTER — Other Ambulatory Visit: Payer: Self-pay

## 2020-04-09 DIAGNOSIS — Z720 Tobacco use: Secondary | ICD-10-CM

## 2020-04-09 DIAGNOSIS — K449 Diaphragmatic hernia without obstruction or gangrene: Secondary | ICD-10-CM | POA: Diagnosis not present

## 2020-04-09 DIAGNOSIS — F17210 Nicotine dependence, cigarettes, uncomplicated: Secondary | ICD-10-CM | POA: Diagnosis not present

## 2020-04-09 DIAGNOSIS — J432 Centrilobular emphysema: Secondary | ICD-10-CM | POA: Diagnosis not present

## 2020-04-09 DIAGNOSIS — I2510 Atherosclerotic heart disease of native coronary artery without angina pectoris: Secondary | ICD-10-CM | POA: Diagnosis not present

## 2020-04-10 DIAGNOSIS — D631 Anemia in chronic kidney disease: Secondary | ICD-10-CM | POA: Diagnosis not present

## 2020-04-10 DIAGNOSIS — D689 Coagulation defect, unspecified: Secondary | ICD-10-CM | POA: Diagnosis not present

## 2020-04-10 DIAGNOSIS — D509 Iron deficiency anemia, unspecified: Secondary | ICD-10-CM | POA: Diagnosis not present

## 2020-04-10 DIAGNOSIS — N186 End stage renal disease: Secondary | ICD-10-CM | POA: Diagnosis not present

## 2020-04-10 DIAGNOSIS — Z992 Dependence on renal dialysis: Secondary | ICD-10-CM | POA: Diagnosis not present

## 2020-04-10 DIAGNOSIS — N2581 Secondary hyperparathyroidism of renal origin: Secondary | ICD-10-CM | POA: Diagnosis not present

## 2020-04-10 DIAGNOSIS — E1122 Type 2 diabetes mellitus with diabetic chronic kidney disease: Secondary | ICD-10-CM | POA: Diagnosis not present

## 2020-04-13 DIAGNOSIS — N2581 Secondary hyperparathyroidism of renal origin: Secondary | ICD-10-CM | POA: Diagnosis not present

## 2020-04-13 DIAGNOSIS — D689 Coagulation defect, unspecified: Secondary | ICD-10-CM | POA: Diagnosis not present

## 2020-04-13 DIAGNOSIS — D631 Anemia in chronic kidney disease: Secondary | ICD-10-CM | POA: Diagnosis not present

## 2020-04-13 DIAGNOSIS — N186 End stage renal disease: Secondary | ICD-10-CM | POA: Diagnosis not present

## 2020-04-13 DIAGNOSIS — D509 Iron deficiency anemia, unspecified: Secondary | ICD-10-CM | POA: Diagnosis not present

## 2020-04-13 DIAGNOSIS — E1122 Type 2 diabetes mellitus with diabetic chronic kidney disease: Secondary | ICD-10-CM | POA: Diagnosis not present

## 2020-04-13 DIAGNOSIS — Z992 Dependence on renal dialysis: Secondary | ICD-10-CM | POA: Diagnosis not present

## 2020-04-15 DIAGNOSIS — D689 Coagulation defect, unspecified: Secondary | ICD-10-CM | POA: Diagnosis not present

## 2020-04-15 DIAGNOSIS — N2581 Secondary hyperparathyroidism of renal origin: Secondary | ICD-10-CM | POA: Diagnosis not present

## 2020-04-15 DIAGNOSIS — N186 End stage renal disease: Secondary | ICD-10-CM | POA: Diagnosis not present

## 2020-04-15 DIAGNOSIS — D509 Iron deficiency anemia, unspecified: Secondary | ICD-10-CM | POA: Diagnosis not present

## 2020-04-15 DIAGNOSIS — E1122 Type 2 diabetes mellitus with diabetic chronic kidney disease: Secondary | ICD-10-CM | POA: Diagnosis not present

## 2020-04-15 DIAGNOSIS — Z992 Dependence on renal dialysis: Secondary | ICD-10-CM | POA: Diagnosis not present

## 2020-04-15 DIAGNOSIS — D631 Anemia in chronic kidney disease: Secondary | ICD-10-CM | POA: Diagnosis not present

## 2020-04-18 DIAGNOSIS — N2581 Secondary hyperparathyroidism of renal origin: Secondary | ICD-10-CM | POA: Diagnosis not present

## 2020-04-18 DIAGNOSIS — D631 Anemia in chronic kidney disease: Secondary | ICD-10-CM | POA: Diagnosis not present

## 2020-04-18 DIAGNOSIS — N186 End stage renal disease: Secondary | ICD-10-CM | POA: Diagnosis not present

## 2020-04-18 DIAGNOSIS — D689 Coagulation defect, unspecified: Secondary | ICD-10-CM | POA: Diagnosis not present

## 2020-04-18 DIAGNOSIS — E1122 Type 2 diabetes mellitus with diabetic chronic kidney disease: Secondary | ICD-10-CM | POA: Diagnosis not present

## 2020-04-18 DIAGNOSIS — D509 Iron deficiency anemia, unspecified: Secondary | ICD-10-CM | POA: Diagnosis not present

## 2020-04-18 DIAGNOSIS — Z992 Dependence on renal dialysis: Secondary | ICD-10-CM | POA: Diagnosis not present

## 2020-04-20 ENCOUNTER — Other Ambulatory Visit: Payer: Self-pay | Admitting: Nurse Practitioner

## 2020-04-20 DIAGNOSIS — D631 Anemia in chronic kidney disease: Secondary | ICD-10-CM | POA: Diagnosis not present

## 2020-04-20 DIAGNOSIS — N2581 Secondary hyperparathyroidism of renal origin: Secondary | ICD-10-CM | POA: Diagnosis not present

## 2020-04-20 DIAGNOSIS — D689 Coagulation defect, unspecified: Secondary | ICD-10-CM | POA: Diagnosis not present

## 2020-04-20 DIAGNOSIS — D509 Iron deficiency anemia, unspecified: Secondary | ICD-10-CM | POA: Diagnosis not present

## 2020-04-20 DIAGNOSIS — Z992 Dependence on renal dialysis: Secondary | ICD-10-CM | POA: Diagnosis not present

## 2020-04-20 DIAGNOSIS — E1122 Type 2 diabetes mellitus with diabetic chronic kidney disease: Secondary | ICD-10-CM | POA: Diagnosis not present

## 2020-04-20 DIAGNOSIS — N186 End stage renal disease: Secondary | ICD-10-CM | POA: Diagnosis not present

## 2020-04-21 ENCOUNTER — Encounter: Payer: Self-pay | Admitting: Gastroenterology

## 2020-04-22 DIAGNOSIS — D689 Coagulation defect, unspecified: Secondary | ICD-10-CM | POA: Diagnosis not present

## 2020-04-22 DIAGNOSIS — E1122 Type 2 diabetes mellitus with diabetic chronic kidney disease: Secondary | ICD-10-CM | POA: Diagnosis not present

## 2020-04-22 DIAGNOSIS — D509 Iron deficiency anemia, unspecified: Secondary | ICD-10-CM | POA: Diagnosis not present

## 2020-04-22 DIAGNOSIS — D631 Anemia in chronic kidney disease: Secondary | ICD-10-CM | POA: Diagnosis not present

## 2020-04-22 DIAGNOSIS — N2581 Secondary hyperparathyroidism of renal origin: Secondary | ICD-10-CM | POA: Diagnosis not present

## 2020-04-22 DIAGNOSIS — N186 End stage renal disease: Secondary | ICD-10-CM | POA: Diagnosis not present

## 2020-04-22 DIAGNOSIS — Z992 Dependence on renal dialysis: Secondary | ICD-10-CM | POA: Diagnosis not present

## 2020-04-23 DIAGNOSIS — N186 End stage renal disease: Secondary | ICD-10-CM | POA: Diagnosis not present

## 2020-04-23 DIAGNOSIS — Z992 Dependence on renal dialysis: Secondary | ICD-10-CM | POA: Diagnosis not present

## 2020-04-23 DIAGNOSIS — E1129 Type 2 diabetes mellitus with other diabetic kidney complication: Secondary | ICD-10-CM | POA: Diagnosis not present

## 2020-04-24 LAB — COMPREHENSIVE METABOLIC PANEL
Albumin: 4 (ref 3.5–5.0)
Calcium: 9.7 (ref 8.7–10.7)

## 2020-04-24 LAB — CBC AND DIFFERENTIAL: Hemoglobin: 10.9 — AB (ref 12.0–16.0)

## 2020-04-24 LAB — BASIC METABOLIC PANEL: Potassium: 4.9 (ref 3.4–5.3)

## 2020-04-25 DIAGNOSIS — D509 Iron deficiency anemia, unspecified: Secondary | ICD-10-CM | POA: Diagnosis not present

## 2020-04-25 DIAGNOSIS — N2581 Secondary hyperparathyroidism of renal origin: Secondary | ICD-10-CM | POA: Diagnosis not present

## 2020-04-25 DIAGNOSIS — N186 End stage renal disease: Secondary | ICD-10-CM | POA: Diagnosis not present

## 2020-04-25 DIAGNOSIS — D689 Coagulation defect, unspecified: Secondary | ICD-10-CM | POA: Diagnosis not present

## 2020-04-25 DIAGNOSIS — Z992 Dependence on renal dialysis: Secondary | ICD-10-CM | POA: Diagnosis not present

## 2020-04-25 DIAGNOSIS — E1122 Type 2 diabetes mellitus with diabetic chronic kidney disease: Secondary | ICD-10-CM | POA: Diagnosis not present

## 2020-04-25 DIAGNOSIS — D631 Anemia in chronic kidney disease: Secondary | ICD-10-CM | POA: Diagnosis not present

## 2020-04-27 DIAGNOSIS — D631 Anemia in chronic kidney disease: Secondary | ICD-10-CM | POA: Diagnosis not present

## 2020-04-27 DIAGNOSIS — Z992 Dependence on renal dialysis: Secondary | ICD-10-CM | POA: Diagnosis not present

## 2020-04-27 DIAGNOSIS — N2581 Secondary hyperparathyroidism of renal origin: Secondary | ICD-10-CM | POA: Diagnosis not present

## 2020-04-27 DIAGNOSIS — N186 End stage renal disease: Secondary | ICD-10-CM | POA: Diagnosis not present

## 2020-04-27 DIAGNOSIS — D509 Iron deficiency anemia, unspecified: Secondary | ICD-10-CM | POA: Diagnosis not present

## 2020-04-27 DIAGNOSIS — D689 Coagulation defect, unspecified: Secondary | ICD-10-CM | POA: Diagnosis not present

## 2020-04-27 DIAGNOSIS — E1122 Type 2 diabetes mellitus with diabetic chronic kidney disease: Secondary | ICD-10-CM | POA: Diagnosis not present

## 2020-04-29 DIAGNOSIS — E1122 Type 2 diabetes mellitus with diabetic chronic kidney disease: Secondary | ICD-10-CM | POA: Diagnosis not present

## 2020-04-29 DIAGNOSIS — N2581 Secondary hyperparathyroidism of renal origin: Secondary | ICD-10-CM | POA: Diagnosis not present

## 2020-04-29 DIAGNOSIS — Z992 Dependence on renal dialysis: Secondary | ICD-10-CM | POA: Diagnosis not present

## 2020-04-29 DIAGNOSIS — D631 Anemia in chronic kidney disease: Secondary | ICD-10-CM | POA: Diagnosis not present

## 2020-04-29 DIAGNOSIS — D689 Coagulation defect, unspecified: Secondary | ICD-10-CM | POA: Diagnosis not present

## 2020-04-29 DIAGNOSIS — N186 End stage renal disease: Secondary | ICD-10-CM | POA: Diagnosis not present

## 2020-04-29 DIAGNOSIS — D509 Iron deficiency anemia, unspecified: Secondary | ICD-10-CM | POA: Diagnosis not present

## 2020-05-01 DIAGNOSIS — N186 End stage renal disease: Secondary | ICD-10-CM | POA: Diagnosis not present

## 2020-05-01 DIAGNOSIS — N2581 Secondary hyperparathyroidism of renal origin: Secondary | ICD-10-CM | POA: Diagnosis not present

## 2020-05-01 DIAGNOSIS — Z992 Dependence on renal dialysis: Secondary | ICD-10-CM | POA: Diagnosis not present

## 2020-05-01 DIAGNOSIS — D631 Anemia in chronic kidney disease: Secondary | ICD-10-CM | POA: Diagnosis not present

## 2020-05-01 DIAGNOSIS — D689 Coagulation defect, unspecified: Secondary | ICD-10-CM | POA: Diagnosis not present

## 2020-05-01 DIAGNOSIS — E1122 Type 2 diabetes mellitus with diabetic chronic kidney disease: Secondary | ICD-10-CM | POA: Diagnosis not present

## 2020-05-01 DIAGNOSIS — D509 Iron deficiency anemia, unspecified: Secondary | ICD-10-CM | POA: Diagnosis not present

## 2020-05-04 DIAGNOSIS — Z992 Dependence on renal dialysis: Secondary | ICD-10-CM | POA: Diagnosis not present

## 2020-05-04 DIAGNOSIS — N2581 Secondary hyperparathyroidism of renal origin: Secondary | ICD-10-CM | POA: Diagnosis not present

## 2020-05-04 DIAGNOSIS — D689 Coagulation defect, unspecified: Secondary | ICD-10-CM | POA: Diagnosis not present

## 2020-05-04 DIAGNOSIS — D631 Anemia in chronic kidney disease: Secondary | ICD-10-CM | POA: Diagnosis not present

## 2020-05-04 DIAGNOSIS — E1122 Type 2 diabetes mellitus with diabetic chronic kidney disease: Secondary | ICD-10-CM | POA: Diagnosis not present

## 2020-05-04 DIAGNOSIS — D509 Iron deficiency anemia, unspecified: Secondary | ICD-10-CM | POA: Diagnosis not present

## 2020-05-04 DIAGNOSIS — N186 End stage renal disease: Secondary | ICD-10-CM | POA: Diagnosis not present

## 2020-05-06 DIAGNOSIS — D631 Anemia in chronic kidney disease: Secondary | ICD-10-CM | POA: Diagnosis not present

## 2020-05-06 DIAGNOSIS — Z992 Dependence on renal dialysis: Secondary | ICD-10-CM | POA: Diagnosis not present

## 2020-05-06 DIAGNOSIS — N2581 Secondary hyperparathyroidism of renal origin: Secondary | ICD-10-CM | POA: Diagnosis not present

## 2020-05-06 DIAGNOSIS — N186 End stage renal disease: Secondary | ICD-10-CM | POA: Diagnosis not present

## 2020-05-06 DIAGNOSIS — D689 Coagulation defect, unspecified: Secondary | ICD-10-CM | POA: Diagnosis not present

## 2020-05-06 DIAGNOSIS — E1122 Type 2 diabetes mellitus with diabetic chronic kidney disease: Secondary | ICD-10-CM | POA: Diagnosis not present

## 2020-05-06 DIAGNOSIS — D509 Iron deficiency anemia, unspecified: Secondary | ICD-10-CM | POA: Diagnosis not present

## 2020-05-08 DIAGNOSIS — D689 Coagulation defect, unspecified: Secondary | ICD-10-CM | POA: Diagnosis not present

## 2020-05-08 DIAGNOSIS — N186 End stage renal disease: Secondary | ICD-10-CM | POA: Diagnosis not present

## 2020-05-08 DIAGNOSIS — E1122 Type 2 diabetes mellitus with diabetic chronic kidney disease: Secondary | ICD-10-CM | POA: Diagnosis not present

## 2020-05-08 DIAGNOSIS — Z992 Dependence on renal dialysis: Secondary | ICD-10-CM | POA: Diagnosis not present

## 2020-05-08 DIAGNOSIS — D509 Iron deficiency anemia, unspecified: Secondary | ICD-10-CM | POA: Diagnosis not present

## 2020-05-08 DIAGNOSIS — N2581 Secondary hyperparathyroidism of renal origin: Secondary | ICD-10-CM | POA: Diagnosis not present

## 2020-05-08 DIAGNOSIS — D631 Anemia in chronic kidney disease: Secondary | ICD-10-CM | POA: Diagnosis not present

## 2020-05-11 ENCOUNTER — Telehealth: Payer: Self-pay | Admitting: *Deleted

## 2020-05-11 DIAGNOSIS — E1122 Type 2 diabetes mellitus with diabetic chronic kidney disease: Secondary | ICD-10-CM | POA: Diagnosis not present

## 2020-05-11 DIAGNOSIS — N186 End stage renal disease: Secondary | ICD-10-CM | POA: Diagnosis not present

## 2020-05-11 DIAGNOSIS — Z992 Dependence on renal dialysis: Secondary | ICD-10-CM | POA: Diagnosis not present

## 2020-05-11 DIAGNOSIS — N2581 Secondary hyperparathyroidism of renal origin: Secondary | ICD-10-CM | POA: Diagnosis not present

## 2020-05-11 DIAGNOSIS — D509 Iron deficiency anemia, unspecified: Secondary | ICD-10-CM | POA: Diagnosis not present

## 2020-05-11 DIAGNOSIS — D689 Coagulation defect, unspecified: Secondary | ICD-10-CM | POA: Diagnosis not present

## 2020-05-11 DIAGNOSIS — D631 Anemia in chronic kidney disease: Secondary | ICD-10-CM | POA: Diagnosis not present

## 2020-05-11 NOTE — Telephone Encounter (Signed)
Called and spoke with [facility] [phone] and they stated that they will ship out patient's medication, Prolia, on 05/14/2020 to our office for her appointment on 05/21/2020. Stated that they will call the patient to update her insurance information.

## 2020-05-13 DIAGNOSIS — Z992 Dependence on renal dialysis: Secondary | ICD-10-CM | POA: Diagnosis not present

## 2020-05-13 DIAGNOSIS — D631 Anemia in chronic kidney disease: Secondary | ICD-10-CM | POA: Diagnosis not present

## 2020-05-13 DIAGNOSIS — N186 End stage renal disease: Secondary | ICD-10-CM | POA: Diagnosis not present

## 2020-05-13 DIAGNOSIS — D689 Coagulation defect, unspecified: Secondary | ICD-10-CM | POA: Diagnosis not present

## 2020-05-13 DIAGNOSIS — E1122 Type 2 diabetes mellitus with diabetic chronic kidney disease: Secondary | ICD-10-CM | POA: Diagnosis not present

## 2020-05-13 DIAGNOSIS — N2581 Secondary hyperparathyroidism of renal origin: Secondary | ICD-10-CM | POA: Diagnosis not present

## 2020-05-13 DIAGNOSIS — D509 Iron deficiency anemia, unspecified: Secondary | ICD-10-CM | POA: Diagnosis not present

## 2020-05-15 DIAGNOSIS — D631 Anemia in chronic kidney disease: Secondary | ICD-10-CM | POA: Diagnosis not present

## 2020-05-15 DIAGNOSIS — N2581 Secondary hyperparathyroidism of renal origin: Secondary | ICD-10-CM | POA: Diagnosis not present

## 2020-05-15 DIAGNOSIS — N186 End stage renal disease: Secondary | ICD-10-CM | POA: Diagnosis not present

## 2020-05-15 DIAGNOSIS — D689 Coagulation defect, unspecified: Secondary | ICD-10-CM | POA: Diagnosis not present

## 2020-05-15 DIAGNOSIS — D509 Iron deficiency anemia, unspecified: Secondary | ICD-10-CM | POA: Diagnosis not present

## 2020-05-15 DIAGNOSIS — E1122 Type 2 diabetes mellitus with diabetic chronic kidney disease: Secondary | ICD-10-CM | POA: Diagnosis not present

## 2020-05-15 DIAGNOSIS — Z992 Dependence on renal dialysis: Secondary | ICD-10-CM | POA: Diagnosis not present

## 2020-05-18 DIAGNOSIS — D689 Coagulation defect, unspecified: Secondary | ICD-10-CM | POA: Diagnosis not present

## 2020-05-18 DIAGNOSIS — Z992 Dependence on renal dialysis: Secondary | ICD-10-CM | POA: Diagnosis not present

## 2020-05-18 DIAGNOSIS — D509 Iron deficiency anemia, unspecified: Secondary | ICD-10-CM | POA: Diagnosis not present

## 2020-05-18 DIAGNOSIS — N186 End stage renal disease: Secondary | ICD-10-CM | POA: Diagnosis not present

## 2020-05-18 DIAGNOSIS — E1122 Type 2 diabetes mellitus with diabetic chronic kidney disease: Secondary | ICD-10-CM | POA: Diagnosis not present

## 2020-05-18 DIAGNOSIS — N2581 Secondary hyperparathyroidism of renal origin: Secondary | ICD-10-CM | POA: Diagnosis not present

## 2020-05-18 DIAGNOSIS — D631 Anemia in chronic kidney disease: Secondary | ICD-10-CM | POA: Diagnosis not present

## 2020-05-20 DIAGNOSIS — N2581 Secondary hyperparathyroidism of renal origin: Secondary | ICD-10-CM | POA: Diagnosis not present

## 2020-05-20 DIAGNOSIS — D509 Iron deficiency anemia, unspecified: Secondary | ICD-10-CM | POA: Diagnosis not present

## 2020-05-20 DIAGNOSIS — D689 Coagulation defect, unspecified: Secondary | ICD-10-CM | POA: Diagnosis not present

## 2020-05-20 DIAGNOSIS — N186 End stage renal disease: Secondary | ICD-10-CM | POA: Diagnosis not present

## 2020-05-20 DIAGNOSIS — D631 Anemia in chronic kidney disease: Secondary | ICD-10-CM | POA: Diagnosis not present

## 2020-05-20 DIAGNOSIS — Z992 Dependence on renal dialysis: Secondary | ICD-10-CM | POA: Diagnosis not present

## 2020-05-20 DIAGNOSIS — E1122 Type 2 diabetes mellitus with diabetic chronic kidney disease: Secondary | ICD-10-CM | POA: Diagnosis not present

## 2020-05-21 ENCOUNTER — Other Ambulatory Visit: Payer: Self-pay

## 2020-05-21 ENCOUNTER — Ambulatory Visit: Payer: Medicare Other

## 2020-05-21 ENCOUNTER — Encounter: Payer: Self-pay | Admitting: Nurse Practitioner

## 2020-05-21 ENCOUNTER — Ambulatory Visit (INDEPENDENT_AMBULATORY_CARE_PROVIDER_SITE_OTHER): Payer: Medicare Other | Admitting: Nurse Practitioner

## 2020-05-21 VITALS — BP 150/100 | HR 97 | Temp 97.2°F | Ht 65.0 in | Wt 170.8 lb

## 2020-05-21 DIAGNOSIS — E1122 Type 2 diabetes mellitus with diabetic chronic kidney disease: Secondary | ICD-10-CM

## 2020-05-21 DIAGNOSIS — Z794 Long term (current) use of insulin: Secondary | ICD-10-CM | POA: Diagnosis not present

## 2020-05-21 DIAGNOSIS — K22719 Barrett's esophagus with dysplasia, unspecified: Secondary | ICD-10-CM | POA: Diagnosis not present

## 2020-05-21 DIAGNOSIS — Z720 Tobacco use: Secondary | ICD-10-CM | POA: Diagnosis not present

## 2020-05-21 DIAGNOSIS — Z992 Dependence on renal dialysis: Secondary | ICD-10-CM

## 2020-05-21 DIAGNOSIS — N186 End stage renal disease: Secondary | ICD-10-CM | POA: Diagnosis not present

## 2020-05-21 DIAGNOSIS — F320 Major depressive disorder, single episode, mild: Secondary | ICD-10-CM

## 2020-05-21 DIAGNOSIS — M810 Age-related osteoporosis without current pathological fracture: Secondary | ICD-10-CM

## 2020-05-21 DIAGNOSIS — F419 Anxiety disorder, unspecified: Secondary | ICD-10-CM

## 2020-05-21 MED ORDER — DENOSUMAB 60 MG/ML ~~LOC~~ SOSY
60.0000 mg | PREFILLED_SYRINGE | Freq: Once | SUBCUTANEOUS | Status: AC
  Administered 2020-05-21: 60 mg via SUBCUTANEOUS

## 2020-05-21 NOTE — Progress Notes (Signed)
Careteam: Patient Care Team: [name], NP as PCP - General (Nurse Practitioner) [name], MD as Consulting Physician (Ophthalmology) [name], MD as Consulting Physician (Nephrology) [name], NP as Nurse Practitioner (Nurse Practitioner)  PLACE OF SERVICE:  [place] Directive information Does Patient Have a Medical Advance Directive?: Yes, Type of Advance Directive: Out of facility DNR (pink MOST or yellow form), Pre-existing out of facility DNR order (yellow form or pink MOST form): Yellow form placed in chart (order not valid for inpatient use), Does patient want to make changes to medical advance directive?: No - Patient declined  Allergies  Allergen Reactions  . Lisinopril Other (See Comments)    Abnormal Kidney Function   . Pioglitazone Other (See Comments)    REACTION: Desquamation of skin of the palm  . Morphine Other (See Comments)  . Other Nausea And Vomiting  . Morphine And Related Nausea And Vomiting  . Sulfonamide Derivatives Rash    Chief Complaint  Patient presents with  . Medical Management of Chronic Issues    Follow up visit.      HPI: Patient is a 75 y.o. female for routine follow up.  Reports blood pressure managed with dialaysis, high before she goes but low when she leaves  Still smokes- 1 pack last abt 2 weeks.   Hyperlipidemia- continues on pravastatin and zetia. Eaten today  DM- diet controlled.   barrett's esophagus- EGD scheduled for next month  Reports mood has been good. Sleeping well at night.   Review of Systems:  Review of Systems  Constitutional: Negative for chills, fever and weight loss.  HENT: Negative for tinnitus.   Respiratory: Negative for cough, sputum production and shortness of breath.   Cardiovascular: Negative for chest pain, palpitations and leg swelling.  Gastrointestinal: Negative for abdominal pain, constipation, diarrhea and heartburn.  Genitourinary: Negative  for dysuria, frequency and urgency.       HD, does still urinate  Musculoskeletal: Negative for back pain, falls, joint pain and myalgias.  Skin: Negative.   Neurological: Negative for dizziness and headaches.  Psychiatric/Behavioral: Negative for depression (in remission) and memory loss. The patient is not nervous/anxious and does not have insomnia.     Past Medical History:  Diagnosis Date  . Anemia   . Anxiety   . Arthritis    lower back and knees   . Asthma    childhood  . Barrett's esophagus   . Chronic kidney disease   . Coagulation defect ([place])   . Depression   . Diabetes mellitus   . Dysphagia   . Eczema   . End stage renal disease ([place])   . Fluid overload, unspecified   . History of herpes zoster 02/2010   Recovered fully after period of acute herpetic neuralgia tx w/ gabapentin.   Marland Kitchen Hx MRSA infection 2009  . Hypercalcemia   . Hyperlipidemia   . Hypertension   . Moderate protein-calorie malnutrition ([place])   . Neuromuscular disorder (HCC)    chronic pain  . Personal history of colonic polyps 10/17/2010   hyperplastic   Past Surgical History:  Procedure Laterality Date  . ABDOMINAL HYSTERECTOMY     unclear when  . AV FISTULA PLACEMENT Left 02/13/2017   Procedure: ARTERIOVENOUS (AV) GORE-TEX STRETCH GRAFT INSERTION INTO LEFT ARM;  Surgeon: Elam Dutch, MD;  Location: [facility];  Service: Vascular;  Laterality: Left;  . CATARACT EXTRACTION Bilateral   . thigh surg     left  side due to MRSA   Social History:   reports that she has been smoking cigarettes. She has a 40.00 pack-year smoking history. She has never used smokeless tobacco. She reports current alcohol use of about 2.0 standard drinks of alcohol per week. She reports that she does not use drugs.  Family History  Problem Relation Age of Onset  . Hypertension Father   . Kidney disease Father   . Diabetes Brother   . Colon cancer Neg Hx   . CAD Neg Hx     Medications: Patient's Medications   New Prescriptions   No medications on file  Previous Medications   ASPIRIN 81 MG CHEWABLE TABLET    Chew 1 tablet (81 mg total) by mouth daily.   CALCIUM ACETATE (PHOSLO) 667 MG CAPSULE    Take by mouth 3 (three) times daily with meals.   CINACALCET (SENSIPAR) 30 MG TABLET    Take 30 mg by mouth daily.   CLOPIDOGREL (PLAVIX) 75 MG TABLET    Take 1 tablet (75 mg total) by mouth daily. Do not use blister packs DX I63.9   EZETIMIBE (ZETIA) 10 MG TABLET    TAKE 1 TABLET BY MOUTH EVERY DAY   PAROXETINE (PAXIL) 40 MG TABLET    TAKE 1 TABLET BY MOUTH EVERY DAY   PRAVASTATIN (PRAVACHOL) 40 MG TABLET    TAKE 1 TABLET BY MOUTH EVERYDAY AT BEDTIME   PROLIA 60 MG/ML SOSY INJECTION    TO BE ADMINISTERED IN PHYSICIAN'S OFFICE. INJECT ONE SYRINGE SUBCOUSLY ONCE EVERY 6 MONTHS. REFRIGERATE. USE WITHIN 14 DAYS ONCE AT ROOM TEMPERATURE.   SEVELAMER CARBONATE (RENVELA) 2.4 G PACK    2.4 g 3 (three) times daily with meals.   TRAZODONE (DESYREL) 100 MG TABLET    Take 1 tablet (100 mg total) by mouth at bedtime. For sleep F41.9 , F32.9   TRIAMCINOLONE CREAM (KENALOG) 0.1 %    APPLY 1 APPLICATION TOPICALLY 3 (THREE) TIMES DAILY AS NEEDED (RASH).  Modified Medications   No medications on file  Discontinued Medications   No medications on file    Physical Exam:  There were no vitals filed for this visit. There is no height or weight on file to calculate BMI. Wt Readings from Last 3 Encounters:  03/03/20 174 lb (78.9 kg)  08/22/19 180 lb (81.6 kg)  03/07/19 186 lb (84.4 kg)    Physical Exam Constitutional:      General: She is not in acute distress.    Appearance: She is well-developed and well-nourished. She is not diaphoretic.  HENT:     Head: Normocephalic and atraumatic.     Mouth/Throat:     Mouth: Oropharynx is clear and moist.     Pharynx: No oropharyngeal exudate.  Eyes:     Conjunctiva/sclera: Conjunctivae normal.     Pupils: Pupils are equal, round, and reactive to light.  Cardiovascular:      Rate and Rhythm: Normal rate and regular rhythm.     Heart sounds: Normal heart sounds.  Pulmonary:     Effort: Pulmonary effort is normal.     Breath sounds: Normal breath sounds.  Abdominal:     General: Bowel sounds are normal.     Palpations: Abdomen is soft.  Musculoskeletal:        General: No tenderness or edema.     Cervical back: Normal range of motion and neck supple.  Skin:    General: Skin is warm and dry.  Neurological:  Mental Status: She is alert and oriented to person, place, and time.  Psychiatric:        Mood and Affect: Mood and affect and mood normal.        Behavior: Behavior normal.    Labs reviewed: Basic Metabolic Panel: Recent Labs    08/18/19 0000 08/22/19 0941  K 4.2  --   CALCIUM 8.8  --   TSH  --  1.74   Liver Function Tests: Recent Labs    08/18/19 0000 03/03/20 0941  AST  --  17  ALT  --  13  BILITOT  --  0.4  PROT  --  6.9  ALBUMIN 4.2  --    No results for input(s): LIPASE, AMYLASE in the last 8760 hours. No results for input(s): AMMONIA in the last 8760 hours. CBC: Recent Labs    08/18/19 0000  HGB 10.4*   Lipid Panel: Recent Labs    08/22/19 0941  CHOL 120  HDL 42*  LDLCALC 49  TRIG 229*  CHOLHDL 2.9   TSH: Recent Labs    08/22/19 0941  TSH 1.74   A1C: Lab Results  Component Value Date   HGBA1C 5.2 03/03/2020     Assessment/Plan 1. Senile osteoporosis - denosumab (PROLIA) injection 60 mg given today  2. ESRD (end stage renal disease) on dialysis ([place]) Continues on HD Tuesday, Thursday and Saturday.  3. Mild single current episode of major depressive disorder ([place]) -controlled on paxil.   4. Controlled type 2 diabetes mellitus with chronic kidney disease on chronic dialysis, with long-term current use of insulin (HCC) Diet controlled. Encouraged dietary compliance, routine foot care/monitoring and to keep up with diabetic eye exams through ophthalmology   5. Barrett's esophagus with  dysplasia egd scheduled for next month.   6. Anxiety -well controlled on paxil  7. Tobacco abuse Encouraged cessation.     Next appt: 6 months.  Carlos American. [address], [facility] [phone]

## 2020-05-22 DIAGNOSIS — Z992 Dependence on renal dialysis: Secondary | ICD-10-CM | POA: Diagnosis not present

## 2020-05-22 DIAGNOSIS — D689 Coagulation defect, unspecified: Secondary | ICD-10-CM | POA: Diagnosis not present

## 2020-05-22 DIAGNOSIS — D509 Iron deficiency anemia, unspecified: Secondary | ICD-10-CM | POA: Diagnosis not present

## 2020-05-22 DIAGNOSIS — E1122 Type 2 diabetes mellitus with diabetic chronic kidney disease: Secondary | ICD-10-CM | POA: Diagnosis not present

## 2020-05-22 DIAGNOSIS — D631 Anemia in chronic kidney disease: Secondary | ICD-10-CM | POA: Diagnosis not present

## 2020-05-22 DIAGNOSIS — N2581 Secondary hyperparathyroidism of renal origin: Secondary | ICD-10-CM | POA: Diagnosis not present

## 2020-05-22 DIAGNOSIS — N186 End stage renal disease: Secondary | ICD-10-CM | POA: Diagnosis not present

## 2020-05-24 DIAGNOSIS — N186 End stage renal disease: Secondary | ICD-10-CM | POA: Diagnosis not present

## 2020-05-24 DIAGNOSIS — Z992 Dependence on renal dialysis: Secondary | ICD-10-CM | POA: Diagnosis not present

## 2020-05-24 DIAGNOSIS — E1129 Type 2 diabetes mellitus with other diabetic kidney complication: Secondary | ICD-10-CM | POA: Diagnosis not present

## 2020-05-25 DIAGNOSIS — Z992 Dependence on renal dialysis: Secondary | ICD-10-CM | POA: Diagnosis not present

## 2020-05-25 DIAGNOSIS — N2581 Secondary hyperparathyroidism of renal origin: Secondary | ICD-10-CM | POA: Diagnosis not present

## 2020-05-25 DIAGNOSIS — E1122 Type 2 diabetes mellitus with diabetic chronic kidney disease: Secondary | ICD-10-CM | POA: Diagnosis not present

## 2020-05-25 DIAGNOSIS — N186 End stage renal disease: Secondary | ICD-10-CM | POA: Diagnosis not present

## 2020-05-25 DIAGNOSIS — D689 Coagulation defect, unspecified: Secondary | ICD-10-CM | POA: Diagnosis not present

## 2020-05-25 DIAGNOSIS — D631 Anemia in chronic kidney disease: Secondary | ICD-10-CM | POA: Diagnosis not present

## 2020-05-26 DIAGNOSIS — N186 End stage renal disease: Secondary | ICD-10-CM | POA: Diagnosis not present

## 2020-05-26 DIAGNOSIS — Z992 Dependence on renal dialysis: Secondary | ICD-10-CM | POA: Diagnosis not present

## 2020-05-26 DIAGNOSIS — T82858A Stenosis of vascular prosthetic devices, implants and grafts, initial encounter: Secondary | ICD-10-CM | POA: Diagnosis not present

## 2020-05-26 DIAGNOSIS — T82868A Thrombosis of vascular prosthetic devices, implants and grafts, initial encounter: Secondary | ICD-10-CM | POA: Diagnosis not present

## 2020-05-27 DIAGNOSIS — D631 Anemia in chronic kidney disease: Secondary | ICD-10-CM | POA: Diagnosis not present

## 2020-05-27 DIAGNOSIS — E1122 Type 2 diabetes mellitus with diabetic chronic kidney disease: Secondary | ICD-10-CM | POA: Diagnosis not present

## 2020-05-27 DIAGNOSIS — N2581 Secondary hyperparathyroidism of renal origin: Secondary | ICD-10-CM | POA: Diagnosis not present

## 2020-05-27 DIAGNOSIS — D689 Coagulation defect, unspecified: Secondary | ICD-10-CM | POA: Diagnosis not present

## 2020-05-27 DIAGNOSIS — Z992 Dependence on renal dialysis: Secondary | ICD-10-CM | POA: Diagnosis not present

## 2020-05-27 DIAGNOSIS — N186 End stage renal disease: Secondary | ICD-10-CM | POA: Diagnosis not present

## 2020-05-29 DIAGNOSIS — Z992 Dependence on renal dialysis: Secondary | ICD-10-CM | POA: Diagnosis not present

## 2020-05-29 DIAGNOSIS — N2581 Secondary hyperparathyroidism of renal origin: Secondary | ICD-10-CM | POA: Diagnosis not present

## 2020-05-29 DIAGNOSIS — D689 Coagulation defect, unspecified: Secondary | ICD-10-CM | POA: Diagnosis not present

## 2020-05-29 DIAGNOSIS — E1122 Type 2 diabetes mellitus with diabetic chronic kidney disease: Secondary | ICD-10-CM | POA: Diagnosis not present

## 2020-05-29 DIAGNOSIS — D631 Anemia in chronic kidney disease: Secondary | ICD-10-CM | POA: Diagnosis not present

## 2020-05-29 DIAGNOSIS — N186 End stage renal disease: Secondary | ICD-10-CM | POA: Diagnosis not present

## 2020-06-01 DIAGNOSIS — N2581 Secondary hyperparathyroidism of renal origin: Secondary | ICD-10-CM | POA: Diagnosis not present

## 2020-06-01 DIAGNOSIS — E1122 Type 2 diabetes mellitus with diabetic chronic kidney disease: Secondary | ICD-10-CM | POA: Diagnosis not present

## 2020-06-01 DIAGNOSIS — Z992 Dependence on renal dialysis: Secondary | ICD-10-CM | POA: Diagnosis not present

## 2020-06-01 DIAGNOSIS — N186 End stage renal disease: Secondary | ICD-10-CM | POA: Diagnosis not present

## 2020-06-01 DIAGNOSIS — D631 Anemia in chronic kidney disease: Secondary | ICD-10-CM | POA: Diagnosis not present

## 2020-06-01 DIAGNOSIS — D689 Coagulation defect, unspecified: Secondary | ICD-10-CM | POA: Diagnosis not present

## 2020-06-03 ENCOUNTER — Other Ambulatory Visit: Payer: Self-pay

## 2020-06-03 ENCOUNTER — Ambulatory Visit (INDEPENDENT_AMBULATORY_CARE_PROVIDER_SITE_OTHER): Payer: Medicare Other | Admitting: Gastroenterology

## 2020-06-03 ENCOUNTER — Encounter: Payer: Self-pay | Admitting: Gastroenterology

## 2020-06-03 ENCOUNTER — Telehealth: Payer: Self-pay | Admitting: *Deleted

## 2020-06-03 VITALS — BP 146/68 | HR 78 | Ht 65.0 in | Wt 170.0 lb

## 2020-06-03 DIAGNOSIS — D631 Anemia in chronic kidney disease: Secondary | ICD-10-CM | POA: Diagnosis not present

## 2020-06-03 DIAGNOSIS — K22719 Barrett's esophagus with dysplasia, unspecified: Secondary | ICD-10-CM | POA: Diagnosis not present

## 2020-06-03 DIAGNOSIS — E1122 Type 2 diabetes mellitus with diabetic chronic kidney disease: Secondary | ICD-10-CM | POA: Diagnosis not present

## 2020-06-03 DIAGNOSIS — N2581 Secondary hyperparathyroidism of renal origin: Secondary | ICD-10-CM | POA: Diagnosis not present

## 2020-06-03 DIAGNOSIS — K219 Gastro-esophageal reflux disease without esophagitis: Secondary | ICD-10-CM | POA: Diagnosis not present

## 2020-06-03 DIAGNOSIS — D689 Coagulation defect, unspecified: Secondary | ICD-10-CM | POA: Diagnosis not present

## 2020-06-03 DIAGNOSIS — Z992 Dependence on renal dialysis: Secondary | ICD-10-CM | POA: Diagnosis not present

## 2020-06-03 DIAGNOSIS — N186 End stage renal disease: Secondary | ICD-10-CM | POA: Diagnosis not present

## 2020-06-03 DIAGNOSIS — Z7902 Long term (current) use of antithrombotics/antiplatelets: Secondary | ICD-10-CM | POA: Diagnosis not present

## 2020-06-03 MED ORDER — PANTOPRAZOLE SODIUM 40 MG PO TBEC: 40.0000 mg | DELAYED_RELEASE_TABLET | Freq: Every day | ORAL | 11 refills | Status: DC

## 2020-06-03 NOTE — Telephone Encounter (Signed)
   [name] [birth year] [id number]  Dear [name], NP:  We have scheduled the above named patient for a(n) endoscopy procedure. Our records show that (s)he is on anticoagulation therapy.  Please advise as to whether the patient may come off their therapy of Plavix 5 days prior to their procedure which is scheduled for Friday 06/18/20.  Please route your response to Caryl Asp, [place] or fax response to [phone].  Sincerely,   Caryl Asp, [facility]

## 2020-06-03 NOTE — Patient Instructions (Addendum)
If you are age 75 or older, your body mass index should be between 23-30. Your Body mass index is 28.29 kg/m. If this is out of the aforementioned range listed, please consider follow up with your Primary Care Provider.  If you are age 2 or younger, your body mass index should be between 19-25. Your Body mass index is 28.29 kg/m. If this is out of the aformentioned range listed, please consider follow up with your Primary Care Provider.   We have sent the following medications to your pharmacy for you to pick up at your convenience: Pantoprazole 40 mg daily.   You have been scheduled for an endoscopy. Please follow written instructions given to you at your visit today. If you use inhalers (even only as needed), please bring them with you on the day of your procedure.  Due to recent changes in healthcare laws, you may see the results of your imaging and laboratory studies on MyChart before your provider has had a chance to review them.  We understand that in some cases there may be results that are confusing or concerning to you. Not all laboratory results come back in the same time frame and the provider may be waiting for multiple results in order to interpret others.  Please give [place] 48 hours in order for your provider to thoroughly review all the results before contacting the office for clarification of your results.   Thank you for choosing me and [facility].  Alonza Bogus, PA-C

## 2020-06-03 NOTE — Progress Notes (Signed)
06/03/2020 [name] [id number] August 02, 1945   HISTORY OF PRESENT ILLNESS:  This is a 75 year old female with PMH listed below who is a patient of Dr. Ardis Hughs.  She has a history of Barrett's esophagus.  Her last EGD was in 2015 that showed the following:  There was a long segment of non-nodular Barrett's change from GE junction (30cm) to proximal esophagus (18cm from incisors). This was sampled in four quadrants every two CM and sent in separate sample jars to pathology. There was a 6cm hiatal hernia with Cameron's appearing erosions. There was mild, non-specific gastritis. The examination was otherwise normal.  It was recommended that she have a repeat EGD in 3 years, which would have been 2018, but that was never performed.  She is already on the scheduled for EGD with Dr. Ardis Hughs later this month.  She was brought in for office visit today, however, because she is on Plavix for remote history of stroke.  This is prescribed by her PCP, [name], NP.  The patient is not currently on any type of PPI therapy.  She tells me that she gets occasional heartburn/reflux, but nothing severe.  No other complaints.  Last colonoscopy was in June 2012 at which time she had some hyperplastic polyps removed and repeat was recommended a 10-year interval.  Past Medical History:  Diagnosis Date  . Anemia   . Anxiety   . Arthritis    lower back and knees   . Asthma    childhood  . Barrett's esophagus   . Chronic kidney disease   . Coagulation defect ([place])   . Depression   . Diabetes mellitus   . Dysphagia   . Eczema   . End stage renal disease ([place])   . Fluid overload, unspecified   . History of herpes zoster 02/2010   Recovered fully after period of acute herpetic neuralgia tx w/ gabapentin.   Marland Kitchen Hx MRSA infection 2009  . Hypercalcemia   . Hyperlipidemia   . Hypertension   . Moderate protein-calorie malnutrition ([place])   . Neuromuscular disorder ([facility])    chronic pain  .  Personal history of colonic polyps 10/17/2010   hyperplastic   Past Surgical History:  Procedure Laterality Date  . ABDOMINAL HYSTERECTOMY     unclear when  . AV FISTULA PLACEMENT Left 02/13/2017   Procedure: ARTERIOVENOUS (AV) GORE-TEX STRETCH GRAFT INSERTION INTO LEFT ARM;  Surgeon: Elam Dutch, MD;  Location: [facility];  Service: Vascular;  Laterality: Left;  . CATARACT EXTRACTION Bilateral   . thigh surg     left side due to MRSA    reports that she has been smoking cigarettes. She has a 40.00 pack-year smoking history. She has never used smokeless tobacco. She reports current alcohol use of about 2.0 standard drinks of alcohol per week. She reports that she does not use drugs. family history includes Diabetes in her brother; Hypertension in her father; Kidney disease in her father. Allergies  Allergen Reactions  . Lisinopril Other (See Comments)    Abnormal Kidney Function   . Pioglitazone Other (See Comments)    REACTION: Desquamation of skin of the palm  . Morphine Other (See Comments)  . Other Nausea And Vomiting  . Morphine And Related Nausea And Vomiting  . Sulfonamide Derivatives Rash      Outpatient Encounter Medications as of 06/03/2020  Medication Sig  . aspirin 81 MG chewable tablet Chew 1 tablet (81 mg total) by mouth daily.  Marland Kitchen  calcium acetate (PHOSLO) 667 MG capsule Take by mouth 3 (three) times daily with meals.  . cinacalcet (SENSIPAR) 30 MG tablet Take 30 mg by mouth daily.  . clopidogrel (PLAVIX) 75 MG tablet Take 1 tablet (75 mg total) by mouth daily. Do not use blister packs DX I63.9  . ezetimibe (ZETIA) 10 MG tablet TAKE 1 TABLET BY MOUTH EVERY DAY  . PARoxetine (PAXIL) 40 MG tablet TAKE 1 TABLET BY MOUTH EVERY DAY  . pravastatin (PRAVACHOL) 40 MG tablet TAKE 1 TABLET BY MOUTH EVERYDAY AT BEDTIME  . PROLIA 60 MG/ML SOSY injection TO BE ADMINISTERED IN PHYSICIAN'S OFFICE. INJECT ONE SYRINGE SUBCOUSLY ONCE EVERY 6 MONTHS. REFRIGERATE. USE WITHIN 14 DAYS ONCE  AT ROOM TEMPERATURE.  . sevelamer carbonate (RENVELA) 2.4 g PACK 2.4 g 3 (three) times daily with meals.  . traZODone (DESYREL) 100 MG tablet Take 1 tablet (100 mg total) by mouth at bedtime. For sleep F41.9 , F32.9  . triamcinolone cream (KENALOG) 0.1 % APPLY 1 APPLICATION TOPICALLY 3 (THREE) TIMES DAILY AS NEEDED (RASH).   No facility-administered encounter medications on file as of 06/03/2020.    REVIEW OF SYSTEMS  : All other systems reviewed and negative except where noted in the History of Present Illness.   PHYSICAL EXAM: BP (!) 146/68   Pulse 78   Ht 5\' 5"  (1.651 m)   Wt 170 lb (77.1 kg)   BMI 28.29 kg/m  General: Well developed AA female in no acute distress Head: Normocephalic and atraumatic Eyes:  Sclerae anicteric, conjunctiva pink. Ears: Normal auditory acuity Lungs: Clear throughout to auscultation; no W/R/R. Heart: Regular rate and rhythm; no M/R/G. Abdomen: Soft, non-distended.  BS present.  Non-tender. Musculoskeletal: Symmetrical with no gross deformities  Skin: No lesions on visible extremities Extremities: No edema  Neurological: Alert oriented x 4, grossly non-focal Psychological:  Alert and cooperative. Normal mood and affect  ASSESSMENT AND PLAN: *Barrett's esophagus/GERD:  Last EGD 2015.  Not on PPI.  Will restart pantoprazole 40 mg daily.  Is already scheduled for EGD with Dr. Ardis Hughs later this month.  The risks, benefits, and alternatives to EGD were discussed with the patient and she consents to proceed.  *Antiplatet use with Plavix for history of a CVA:  Hold Plavix for 5 days before procedure - will instruct when and how to resume after procedure. Risks and benefits of procedure including bleeding, perforation, infection, missed lesions, medication reactions and possible hospitalization or surgery if complications occur explained. Additional rare but real risk of cardiovascular event such as heart attack or ischemia/infarct of other organs off of Plavix  explained and need to seek urgent help if this occurs. Will communicate by phone or EMR with patient's prescribing provider, [name], NP, to confirm that holding Plavix is reasonable in this case.  *ESRD on HD TTS  CC:  [name], NP

## 2020-06-04 NOTE — Progress Notes (Signed)
I agree with the above note, plan 

## 2020-06-04 NOTE — Telephone Encounter (Signed)
Yes okay to hold plavix for 5 days prior to procedure and then resume after procedure is complete. Thank you.

## 2020-06-05 DIAGNOSIS — Z992 Dependence on renal dialysis: Secondary | ICD-10-CM | POA: Diagnosis not present

## 2020-06-05 DIAGNOSIS — D631 Anemia in chronic kidney disease: Secondary | ICD-10-CM | POA: Diagnosis not present

## 2020-06-05 DIAGNOSIS — E1122 Type 2 diabetes mellitus with diabetic chronic kidney disease: Secondary | ICD-10-CM | POA: Diagnosis not present

## 2020-06-05 DIAGNOSIS — N2581 Secondary hyperparathyroidism of renal origin: Secondary | ICD-10-CM | POA: Diagnosis not present

## 2020-06-05 DIAGNOSIS — N186 End stage renal disease: Secondary | ICD-10-CM | POA: Diagnosis not present

## 2020-06-05 DIAGNOSIS — D689 Coagulation defect, unspecified: Secondary | ICD-10-CM | POA: Diagnosis not present

## 2020-06-07 NOTE — Telephone Encounter (Signed)
Patient informed to hold Plavix. Patient voiced understanding. °

## 2020-06-08 DIAGNOSIS — E1122 Type 2 diabetes mellitus with diabetic chronic kidney disease: Secondary | ICD-10-CM | POA: Diagnosis not present

## 2020-06-08 DIAGNOSIS — D631 Anemia in chronic kidney disease: Secondary | ICD-10-CM | POA: Diagnosis not present

## 2020-06-08 DIAGNOSIS — N186 End stage renal disease: Secondary | ICD-10-CM | POA: Diagnosis not present

## 2020-06-08 DIAGNOSIS — D689 Coagulation defect, unspecified: Secondary | ICD-10-CM | POA: Diagnosis not present

## 2020-06-08 DIAGNOSIS — N2581 Secondary hyperparathyroidism of renal origin: Secondary | ICD-10-CM | POA: Diagnosis not present

## 2020-06-08 DIAGNOSIS — Z992 Dependence on renal dialysis: Secondary | ICD-10-CM | POA: Diagnosis not present

## 2020-06-10 DIAGNOSIS — Z992 Dependence on renal dialysis: Secondary | ICD-10-CM | POA: Diagnosis not present

## 2020-06-10 DIAGNOSIS — D631 Anemia in chronic kidney disease: Secondary | ICD-10-CM | POA: Diagnosis not present

## 2020-06-10 DIAGNOSIS — N2581 Secondary hyperparathyroidism of renal origin: Secondary | ICD-10-CM | POA: Diagnosis not present

## 2020-06-10 DIAGNOSIS — E1122 Type 2 diabetes mellitus with diabetic chronic kidney disease: Secondary | ICD-10-CM | POA: Diagnosis not present

## 2020-06-10 DIAGNOSIS — D689 Coagulation defect, unspecified: Secondary | ICD-10-CM | POA: Diagnosis not present

## 2020-06-10 DIAGNOSIS — N186 End stage renal disease: Secondary | ICD-10-CM | POA: Diagnosis not present

## 2020-06-12 DIAGNOSIS — D689 Coagulation defect, unspecified: Secondary | ICD-10-CM | POA: Diagnosis not present

## 2020-06-12 DIAGNOSIS — Z992 Dependence on renal dialysis: Secondary | ICD-10-CM | POA: Diagnosis not present

## 2020-06-12 DIAGNOSIS — E1122 Type 2 diabetes mellitus with diabetic chronic kidney disease: Secondary | ICD-10-CM | POA: Diagnosis not present

## 2020-06-12 DIAGNOSIS — D631 Anemia in chronic kidney disease: Secondary | ICD-10-CM | POA: Diagnosis not present

## 2020-06-12 DIAGNOSIS — N186 End stage renal disease: Secondary | ICD-10-CM | POA: Diagnosis not present

## 2020-06-12 DIAGNOSIS — N2581 Secondary hyperparathyroidism of renal origin: Secondary | ICD-10-CM | POA: Diagnosis not present

## 2020-06-15 DIAGNOSIS — N186 End stage renal disease: Secondary | ICD-10-CM | POA: Diagnosis not present

## 2020-06-15 DIAGNOSIS — D689 Coagulation defect, unspecified: Secondary | ICD-10-CM | POA: Diagnosis not present

## 2020-06-15 DIAGNOSIS — D631 Anemia in chronic kidney disease: Secondary | ICD-10-CM | POA: Diagnosis not present

## 2020-06-15 DIAGNOSIS — N2581 Secondary hyperparathyroidism of renal origin: Secondary | ICD-10-CM | POA: Diagnosis not present

## 2020-06-15 DIAGNOSIS — E1122 Type 2 diabetes mellitus with diabetic chronic kidney disease: Secondary | ICD-10-CM | POA: Diagnosis not present

## 2020-06-15 DIAGNOSIS — Z992 Dependence on renal dialysis: Secondary | ICD-10-CM | POA: Diagnosis not present

## 2020-06-17 DIAGNOSIS — E1122 Type 2 diabetes mellitus with diabetic chronic kidney disease: Secondary | ICD-10-CM | POA: Diagnosis not present

## 2020-06-17 DIAGNOSIS — D631 Anemia in chronic kidney disease: Secondary | ICD-10-CM | POA: Diagnosis not present

## 2020-06-17 DIAGNOSIS — N186 End stage renal disease: Secondary | ICD-10-CM | POA: Diagnosis not present

## 2020-06-17 DIAGNOSIS — D689 Coagulation defect, unspecified: Secondary | ICD-10-CM | POA: Diagnosis not present

## 2020-06-17 DIAGNOSIS — Z992 Dependence on renal dialysis: Secondary | ICD-10-CM | POA: Diagnosis not present

## 2020-06-17 DIAGNOSIS — N2581 Secondary hyperparathyroidism of renal origin: Secondary | ICD-10-CM | POA: Diagnosis not present

## 2020-06-18 ENCOUNTER — Encounter: Payer: Self-pay | Admitting: Gastroenterology

## 2020-06-18 ENCOUNTER — Ambulatory Visit (AMBULATORY_SURGERY_CENTER): Payer: Medicare Other | Admitting: Gastroenterology

## 2020-06-18 ENCOUNTER — Other Ambulatory Visit: Payer: Self-pay

## 2020-06-18 ENCOUNTER — Encounter: Payer: Medicare Other | Admitting: Gastroenterology

## 2020-06-18 VITALS — BP 127/73 | HR 77 | Temp 97.4°F | Resp 14 | Ht 65.0 in | Wt 170.0 lb

## 2020-06-18 DIAGNOSIS — K219 Gastro-esophageal reflux disease without esophagitis: Secondary | ICD-10-CM | POA: Diagnosis not present

## 2020-06-18 DIAGNOSIS — K2270 Barrett's esophagus without dysplasia: Secondary | ICD-10-CM | POA: Diagnosis not present

## 2020-06-18 MED ORDER — SODIUM CHLORIDE 0.9 % IV SOLN
500.0000 mL | Freq: Once | INTRAVENOUS | Status: DC
Start: 2020-06-18 — End: 2020-06-18

## 2020-06-18 NOTE — Progress Notes (Signed)
To PACU, VSS. Report to [email or web address] 

## 2020-06-18 NOTE — Progress Notes (Signed)
Called to room to assist during endoscopic procedure.  Patient ID and intended procedure confirmed with present staff. Received instructions for my participation in the procedure from the performing physician.  

## 2020-06-18 NOTE — Op Note (Addendum)
[facility] Patient Name: [name] Procedure Date: 06/18/2020 9:15 AM MRN: [id number] Endoscopist: Milus Banister , MD Age: 75 Referring MD:  Date of Birth: 09-Jun-1945 Gender: Female Account #: [id number] Procedure:                Upper GI endoscopy Indications:              Heartburn, Surveillance for malignancy due to                            personal history of Barrett's esophagus; EGD 2015                            long segment non-nodular Barrett's without dysplasia Medicines:                Monitored Anesthesia Care Procedure:                Pre-Anesthesia Assessment:                           - Prior to the procedure, a History and Physical                            was performed, and patient medications and                            allergies were reviewed. The patient's tolerance of                            previous anesthesia was also reviewed. The risks                            and benefits of the procedure and the sedation                            options and risks were discussed with the patient.                            All questions were answered, and informed consent                            was obtained. Prior Anticoagulants: The patient has                            taken no previous anticoagulant or antiplatelet                            agents. ASA Grade Assessment: III - A patient with                            severe systemic disease. After reviewing the risks                            and benefits, the patient was deemed in  satisfactory condition to undergo the procedure.                           After obtaining informed consent, the endoscope was                            passed under direct vision. Throughout the                            procedure, the patient's blood pressure, pulse, and                            oxygen saturations were monitored continuously. The                             Endoscope was introduced through the mouth, and                            advanced to the second part of duodenum. The upper                            GI endoscopy was accomplished without difficulty.                            The patient tolerated the procedure well. Scope In: Scope Out: Findings:                 There were esophageal mucosal changes consistent                            with non-nodular, long-segment Barrett's esophagus                            present in the lower third of the esophagus. The                            maximum longitudinal extent of these mucosal                            changes was 10 cm in length (GE junction at 28cm                            from incisors). Mucosa was biopsied with a cold                            forceps for histology in 4 quadrants at intervals                            of 2 cm. A total of 5 specimen bottles were sent to                            pathology.  A large hiatal hernia with obvious Cameron's                            erosoins was present. Complications:            No immediate complications. Estimated blood loss:                            None. Estimated Blood Loss:     Estimated blood loss: none. Impression:               - Long segment, non-nodular Barrett's change,                            biopsied extensively today.                           - Large HH with obvious Cameron's type erosions.                           - The examination was otherwise normal. Recommendation:           - Patient has a contact number available for                            emergencies. The signs and symptoms of potential                            delayed complications were discussed with the                            patient. Return to normal activities tomorrow.                            Written discharge instructions were provided to the                            patient.                            - Resume previous diet.                           - Continue present medications. OK to resume your                            plavix tomorrow.                           - Await pathology results. Milus Banister, MD 06/18/2020 9:38:47 AM This report has been signed electronically.

## 2020-06-18 NOTE — Progress Notes (Signed)
Medical history reviewed with no changes noted. VS assessed by C.W 

## 2020-06-18 NOTE — Patient Instructions (Signed)
Handouts given for Barrett's esophagus and Hiatal Hernia.  RESUME PLAVIX TOMORROW, Saturday 06/19/20 AT PREVIOUS DOSE.  YOU HAD AN ENDOSCOPIC PROCEDURE TODAY AT [facility]:   Refer to the procedure report that was given to you for any specific questions about what was found during the examination.  If the procedure report does not answer your questions, please call your gastroenterologist to clarify.  If you requested that your care partner not be given the details of your procedure findings, then the procedure report has been included in a sealed envelope for you to review at your convenience later.  YOU SHOULD EXPECT: Some feelings of bloating in the abdomen. Passage of more gas than usual.  Walking can help get rid of the air that was put into your GI tract during the procedure and reduce the bloating. If you had a lower endoscopy (such as a colonoscopy or flexible sigmoidoscopy) you may notice spotting of blood in your stool or on the toilet paper. If you underwent a bowel prep for your procedure, you may not have a normal bowel movement for a few days.  Please Note:  You might notice some irritation and congestion in your nose or some drainage.  This is from the oxygen used during your procedure.  There is no need for concern and it should clear up in a day or so.  SYMPTOMS TO REPORT IMMEDIATELY:   Following upper endoscopy (EGD)  Vomiting of blood or coffee ground material  New chest pain or pain under the shoulder blades  Painful or persistently difficult swallowing  New shortness of breath  Fever of 100F or higher  Black, tarry-looking stools  For urgent or emergent issues, a gastroenterologist can be reached at any hour by calling [phone]. Do not use MyChart messaging for urgent concerns.    DIET:  We do recommend a small meal at first, but then you may proceed to your regular diet.  Drink plenty of fluids but you should avoid alcoholic beverages for 24  hours.  ACTIVITY:  You should plan to take it easy for the rest of today and you should NOT DRIVE or use heavy machinery until tomorrow (because of the sedation medicines used during the test).    FOLLOW UP: Our staff will call the number listed on your records 48-72 hours following your procedure to check on you and address any questions or concerns that you may have regarding the information given to you following your procedure. If we do not reach you, we will leave a message.  We will attempt to reach you two times.  During this call, we will ask if you have developed any symptoms of COVID 19. If you develop any symptoms (ie: fever, flu-like symptoms, shortness of breath, cough etc.) before then, please call [phone].  If you test positive for Covid 19 in the 2 weeks post procedure, please call and report this information to [place].    If any biopsies were taken you will be contacted by phone or by letter within the next 1-3 weeks.  Please call us at [phone] if you have not heard about the biopsies in 3 weeks.    SIGNATURES/CONFIDENTIALITY: You and/or your care partner have signed paperwork which will be entered into your electronic medical record.  These signatures attest to the fact that that the information above on your After Visit Summary has been reviewed and is understood.  Full responsibility of the confidentiality of this discharge information lies with  you and/or your care-partner.

## 2020-06-21 DIAGNOSIS — N186 End stage renal disease: Secondary | ICD-10-CM | POA: Diagnosis not present

## 2020-06-21 DIAGNOSIS — Z992 Dependence on renal dialysis: Secondary | ICD-10-CM | POA: Diagnosis not present

## 2020-06-21 DIAGNOSIS — E1129 Type 2 diabetes mellitus with other diabetic kidney complication: Secondary | ICD-10-CM | POA: Diagnosis not present

## 2020-06-22 ENCOUNTER — Telehealth: Payer: Self-pay

## 2020-06-22 ENCOUNTER — Telehealth: Payer: Self-pay | Admitting: *Deleted

## 2020-06-22 DIAGNOSIS — D689 Coagulation defect, unspecified: Secondary | ICD-10-CM | POA: Diagnosis not present

## 2020-06-22 DIAGNOSIS — D631 Anemia in chronic kidney disease: Secondary | ICD-10-CM | POA: Diagnosis not present

## 2020-06-22 DIAGNOSIS — N186 End stage renal disease: Secondary | ICD-10-CM | POA: Diagnosis not present

## 2020-06-22 DIAGNOSIS — N2581 Secondary hyperparathyroidism of renal origin: Secondary | ICD-10-CM | POA: Diagnosis not present

## 2020-06-22 DIAGNOSIS — Z992 Dependence on renal dialysis: Secondary | ICD-10-CM | POA: Diagnosis not present

## 2020-06-22 NOTE — Telephone Encounter (Signed)
Not available for follow up call.  

## 2020-06-22 NOTE — Telephone Encounter (Signed)
  Follow up Call-  Call back number 06/18/2020  Post procedure Call Back phone  # [phone]  Permission to leave phone message Yes  Some recent data might be hidden    No answer and no answering machine

## 2020-06-24 DIAGNOSIS — Z992 Dependence on renal dialysis: Secondary | ICD-10-CM | POA: Diagnosis not present

## 2020-06-24 DIAGNOSIS — N2581 Secondary hyperparathyroidism of renal origin: Secondary | ICD-10-CM | POA: Diagnosis not present

## 2020-06-24 DIAGNOSIS — D689 Coagulation defect, unspecified: Secondary | ICD-10-CM | POA: Diagnosis not present

## 2020-06-24 DIAGNOSIS — D631 Anemia in chronic kidney disease: Secondary | ICD-10-CM | POA: Diagnosis not present

## 2020-06-24 DIAGNOSIS — N186 End stage renal disease: Secondary | ICD-10-CM | POA: Diagnosis not present

## 2020-06-25 ENCOUNTER — Encounter: Payer: Self-pay | Admitting: Gastroenterology

## 2020-06-26 DIAGNOSIS — N2581 Secondary hyperparathyroidism of renal origin: Secondary | ICD-10-CM | POA: Diagnosis not present

## 2020-06-26 DIAGNOSIS — D689 Coagulation defect, unspecified: Secondary | ICD-10-CM | POA: Diagnosis not present

## 2020-06-26 DIAGNOSIS — N186 End stage renal disease: Secondary | ICD-10-CM | POA: Diagnosis not present

## 2020-06-26 DIAGNOSIS — D631 Anemia in chronic kidney disease: Secondary | ICD-10-CM | POA: Diagnosis not present

## 2020-06-26 DIAGNOSIS — Z992 Dependence on renal dialysis: Secondary | ICD-10-CM | POA: Diagnosis not present

## 2020-06-29 DIAGNOSIS — N2581 Secondary hyperparathyroidism of renal origin: Secondary | ICD-10-CM | POA: Diagnosis not present

## 2020-06-29 DIAGNOSIS — N186 End stage renal disease: Secondary | ICD-10-CM | POA: Diagnosis not present

## 2020-06-29 DIAGNOSIS — Z992 Dependence on renal dialysis: Secondary | ICD-10-CM | POA: Diagnosis not present

## 2020-06-29 DIAGNOSIS — D689 Coagulation defect, unspecified: Secondary | ICD-10-CM | POA: Diagnosis not present

## 2020-07-01 DIAGNOSIS — N186 End stage renal disease: Secondary | ICD-10-CM | POA: Diagnosis not present

## 2020-07-01 DIAGNOSIS — D689 Coagulation defect, unspecified: Secondary | ICD-10-CM | POA: Diagnosis not present

## 2020-07-01 DIAGNOSIS — N2581 Secondary hyperparathyroidism of renal origin: Secondary | ICD-10-CM | POA: Diagnosis not present

## 2020-07-01 DIAGNOSIS — Z992 Dependence on renal dialysis: Secondary | ICD-10-CM | POA: Diagnosis not present

## 2020-07-03 DIAGNOSIS — Z992 Dependence on renal dialysis: Secondary | ICD-10-CM | POA: Diagnosis not present

## 2020-07-03 DIAGNOSIS — N2581 Secondary hyperparathyroidism of renal origin: Secondary | ICD-10-CM | POA: Diagnosis not present

## 2020-07-03 DIAGNOSIS — D689 Coagulation defect, unspecified: Secondary | ICD-10-CM | POA: Diagnosis not present

## 2020-07-03 DIAGNOSIS — N186 End stage renal disease: Secondary | ICD-10-CM | POA: Diagnosis not present

## 2020-07-06 DIAGNOSIS — N186 End stage renal disease: Secondary | ICD-10-CM | POA: Diagnosis not present

## 2020-07-06 DIAGNOSIS — D689 Coagulation defect, unspecified: Secondary | ICD-10-CM | POA: Diagnosis not present

## 2020-07-06 DIAGNOSIS — E1122 Type 2 diabetes mellitus with diabetic chronic kidney disease: Secondary | ICD-10-CM | POA: Diagnosis not present

## 2020-07-06 DIAGNOSIS — Z992 Dependence on renal dialysis: Secondary | ICD-10-CM | POA: Diagnosis not present

## 2020-07-06 DIAGNOSIS — N2581 Secondary hyperparathyroidism of renal origin: Secondary | ICD-10-CM | POA: Diagnosis not present

## 2020-07-08 DIAGNOSIS — N2581 Secondary hyperparathyroidism of renal origin: Secondary | ICD-10-CM | POA: Diagnosis not present

## 2020-07-08 DIAGNOSIS — E1122 Type 2 diabetes mellitus with diabetic chronic kidney disease: Secondary | ICD-10-CM | POA: Diagnosis not present

## 2020-07-08 DIAGNOSIS — N186 End stage renal disease: Secondary | ICD-10-CM | POA: Diagnosis not present

## 2020-07-08 DIAGNOSIS — Z992 Dependence on renal dialysis: Secondary | ICD-10-CM | POA: Diagnosis not present

## 2020-07-08 DIAGNOSIS — D689 Coagulation defect, unspecified: Secondary | ICD-10-CM | POA: Diagnosis not present

## 2020-07-10 ENCOUNTER — Other Ambulatory Visit: Payer: Self-pay | Admitting: Nurse Practitioner

## 2020-07-10 DIAGNOSIS — G4700 Insomnia, unspecified: Secondary | ICD-10-CM

## 2020-07-10 DIAGNOSIS — E1122 Type 2 diabetes mellitus with diabetic chronic kidney disease: Secondary | ICD-10-CM | POA: Diagnosis not present

## 2020-07-10 DIAGNOSIS — D689 Coagulation defect, unspecified: Secondary | ICD-10-CM | POA: Diagnosis not present

## 2020-07-10 DIAGNOSIS — F32A Depression, unspecified: Secondary | ICD-10-CM

## 2020-07-10 DIAGNOSIS — F419 Anxiety disorder, unspecified: Secondary | ICD-10-CM

## 2020-07-10 DIAGNOSIS — N186 End stage renal disease: Secondary | ICD-10-CM | POA: Diagnosis not present

## 2020-07-10 DIAGNOSIS — Z992 Dependence on renal dialysis: Secondary | ICD-10-CM | POA: Diagnosis not present

## 2020-07-10 DIAGNOSIS — N2581 Secondary hyperparathyroidism of renal origin: Secondary | ICD-10-CM | POA: Diagnosis not present

## 2020-07-12 NOTE — Telephone Encounter (Signed)
Patient has request refill on medication "Trazodone 100 mg". Patient last refill was 01/30/2020 with 90 tablets to be taken at bedtime and 1 refill. Medication pend and sent to PCP Dewaine Oats Carlos American, NP for approval.

## 2020-07-13 DIAGNOSIS — N2581 Secondary hyperparathyroidism of renal origin: Secondary | ICD-10-CM | POA: Diagnosis not present

## 2020-07-13 DIAGNOSIS — Z992 Dependence on renal dialysis: Secondary | ICD-10-CM | POA: Diagnosis not present

## 2020-07-13 DIAGNOSIS — N186 End stage renal disease: Secondary | ICD-10-CM | POA: Diagnosis not present

## 2020-07-13 DIAGNOSIS — D689 Coagulation defect, unspecified: Secondary | ICD-10-CM | POA: Diagnosis not present

## 2020-07-15 DIAGNOSIS — N186 End stage renal disease: Secondary | ICD-10-CM | POA: Diagnosis not present

## 2020-07-15 DIAGNOSIS — Z992 Dependence on renal dialysis: Secondary | ICD-10-CM | POA: Diagnosis not present

## 2020-07-15 DIAGNOSIS — N2581 Secondary hyperparathyroidism of renal origin: Secondary | ICD-10-CM | POA: Diagnosis not present

## 2020-07-15 DIAGNOSIS — D689 Coagulation defect, unspecified: Secondary | ICD-10-CM | POA: Diagnosis not present

## 2020-07-17 DIAGNOSIS — Z992 Dependence on renal dialysis: Secondary | ICD-10-CM | POA: Diagnosis not present

## 2020-07-17 DIAGNOSIS — N2581 Secondary hyperparathyroidism of renal origin: Secondary | ICD-10-CM | POA: Diagnosis not present

## 2020-07-17 DIAGNOSIS — N186 End stage renal disease: Secondary | ICD-10-CM | POA: Diagnosis not present

## 2020-07-17 DIAGNOSIS — D689 Coagulation defect, unspecified: Secondary | ICD-10-CM | POA: Diagnosis not present

## 2020-07-20 DIAGNOSIS — N186 End stage renal disease: Secondary | ICD-10-CM | POA: Diagnosis not present

## 2020-07-20 DIAGNOSIS — N2581 Secondary hyperparathyroidism of renal origin: Secondary | ICD-10-CM | POA: Diagnosis not present

## 2020-07-20 DIAGNOSIS — Z992 Dependence on renal dialysis: Secondary | ICD-10-CM | POA: Diagnosis not present

## 2020-07-20 DIAGNOSIS — D689 Coagulation defect, unspecified: Secondary | ICD-10-CM | POA: Diagnosis not present

## 2020-07-22 DIAGNOSIS — N2581 Secondary hyperparathyroidism of renal origin: Secondary | ICD-10-CM | POA: Diagnosis not present

## 2020-07-22 DIAGNOSIS — Z992 Dependence on renal dialysis: Secondary | ICD-10-CM | POA: Diagnosis not present

## 2020-07-22 DIAGNOSIS — D689 Coagulation defect, unspecified: Secondary | ICD-10-CM | POA: Diagnosis not present

## 2020-07-22 DIAGNOSIS — N186 End stage renal disease: Secondary | ICD-10-CM | POA: Diagnosis not present

## 2020-07-22 DIAGNOSIS — E1129 Type 2 diabetes mellitus with other diabetic kidney complication: Secondary | ICD-10-CM | POA: Diagnosis not present

## 2020-07-24 DIAGNOSIS — D689 Coagulation defect, unspecified: Secondary | ICD-10-CM | POA: Diagnosis not present

## 2020-07-24 DIAGNOSIS — N186 End stage renal disease: Secondary | ICD-10-CM | POA: Diagnosis not present

## 2020-07-24 DIAGNOSIS — Z992 Dependence on renal dialysis: Secondary | ICD-10-CM | POA: Diagnosis not present

## 2020-07-24 DIAGNOSIS — N2581 Secondary hyperparathyroidism of renal origin: Secondary | ICD-10-CM | POA: Diagnosis not present

## 2020-07-27 DIAGNOSIS — Z992 Dependence on renal dialysis: Secondary | ICD-10-CM | POA: Diagnosis not present

## 2020-07-27 DIAGNOSIS — D689 Coagulation defect, unspecified: Secondary | ICD-10-CM | POA: Diagnosis not present

## 2020-07-27 DIAGNOSIS — N2581 Secondary hyperparathyroidism of renal origin: Secondary | ICD-10-CM | POA: Diagnosis not present

## 2020-07-27 DIAGNOSIS — N186 End stage renal disease: Secondary | ICD-10-CM | POA: Diagnosis not present

## 2020-07-29 ENCOUNTER — Other Ambulatory Visit: Payer: Self-pay | Admitting: Nurse Practitioner

## 2020-07-29 DIAGNOSIS — D689 Coagulation defect, unspecified: Secondary | ICD-10-CM | POA: Diagnosis not present

## 2020-07-29 DIAGNOSIS — N186 End stage renal disease: Secondary | ICD-10-CM | POA: Diagnosis not present

## 2020-07-29 DIAGNOSIS — I639 Cerebral infarction, unspecified: Secondary | ICD-10-CM

## 2020-07-29 DIAGNOSIS — N2581 Secondary hyperparathyroidism of renal origin: Secondary | ICD-10-CM | POA: Diagnosis not present

## 2020-07-29 DIAGNOSIS — Z992 Dependence on renal dialysis: Secondary | ICD-10-CM | POA: Diagnosis not present

## 2020-07-31 DIAGNOSIS — D689 Coagulation defect, unspecified: Secondary | ICD-10-CM | POA: Diagnosis not present

## 2020-07-31 DIAGNOSIS — N2581 Secondary hyperparathyroidism of renal origin: Secondary | ICD-10-CM | POA: Diagnosis not present

## 2020-07-31 DIAGNOSIS — Z992 Dependence on renal dialysis: Secondary | ICD-10-CM | POA: Diagnosis not present

## 2020-07-31 DIAGNOSIS — N186 End stage renal disease: Secondary | ICD-10-CM | POA: Diagnosis not present

## 2020-08-03 DIAGNOSIS — D631 Anemia in chronic kidney disease: Secondary | ICD-10-CM | POA: Diagnosis not present

## 2020-08-03 DIAGNOSIS — N186 End stage renal disease: Secondary | ICD-10-CM | POA: Diagnosis not present

## 2020-08-03 DIAGNOSIS — Z992 Dependence on renal dialysis: Secondary | ICD-10-CM | POA: Diagnosis not present

## 2020-08-03 DIAGNOSIS — N2581 Secondary hyperparathyroidism of renal origin: Secondary | ICD-10-CM | POA: Diagnosis not present

## 2020-08-03 DIAGNOSIS — D689 Coagulation defect, unspecified: Secondary | ICD-10-CM | POA: Diagnosis not present

## 2020-08-05 DIAGNOSIS — N186 End stage renal disease: Secondary | ICD-10-CM | POA: Diagnosis not present

## 2020-08-05 DIAGNOSIS — D631 Anemia in chronic kidney disease: Secondary | ICD-10-CM | POA: Diagnosis not present

## 2020-08-05 DIAGNOSIS — Z992 Dependence on renal dialysis: Secondary | ICD-10-CM | POA: Diagnosis not present

## 2020-08-05 DIAGNOSIS — N2581 Secondary hyperparathyroidism of renal origin: Secondary | ICD-10-CM | POA: Diagnosis not present

## 2020-08-05 DIAGNOSIS — D689 Coagulation defect, unspecified: Secondary | ICD-10-CM | POA: Diagnosis not present

## 2020-08-06 ENCOUNTER — Other Ambulatory Visit: Payer: Self-pay | Admitting: Nurse Practitioner

## 2020-08-06 DIAGNOSIS — Z8673 Personal history of transient ischemic attack (TIA), and cerebral infarction without residual deficits: Secondary | ICD-10-CM

## 2020-08-07 DIAGNOSIS — N2581 Secondary hyperparathyroidism of renal origin: Secondary | ICD-10-CM | POA: Diagnosis not present

## 2020-08-07 DIAGNOSIS — D631 Anemia in chronic kidney disease: Secondary | ICD-10-CM | POA: Diagnosis not present

## 2020-08-07 DIAGNOSIS — N186 End stage renal disease: Secondary | ICD-10-CM | POA: Diagnosis not present

## 2020-08-07 DIAGNOSIS — D689 Coagulation defect, unspecified: Secondary | ICD-10-CM | POA: Diagnosis not present

## 2020-08-07 DIAGNOSIS — Z992 Dependence on renal dialysis: Secondary | ICD-10-CM | POA: Diagnosis not present

## 2020-08-10 DIAGNOSIS — N186 End stage renal disease: Secondary | ICD-10-CM | POA: Diagnosis not present

## 2020-08-10 DIAGNOSIS — D689 Coagulation defect, unspecified: Secondary | ICD-10-CM | POA: Diagnosis not present

## 2020-08-10 DIAGNOSIS — N2581 Secondary hyperparathyroidism of renal origin: Secondary | ICD-10-CM | POA: Diagnosis not present

## 2020-08-10 DIAGNOSIS — Z992 Dependence on renal dialysis: Secondary | ICD-10-CM | POA: Diagnosis not present

## 2020-08-14 DIAGNOSIS — N186 End stage renal disease: Secondary | ICD-10-CM | POA: Diagnosis not present

## 2020-08-14 DIAGNOSIS — Z992 Dependence on renal dialysis: Secondary | ICD-10-CM | POA: Diagnosis not present

## 2020-08-14 DIAGNOSIS — D689 Coagulation defect, unspecified: Secondary | ICD-10-CM | POA: Diagnosis not present

## 2020-08-14 DIAGNOSIS — N2581 Secondary hyperparathyroidism of renal origin: Secondary | ICD-10-CM | POA: Diagnosis not present

## 2020-08-17 DIAGNOSIS — D689 Coagulation defect, unspecified: Secondary | ICD-10-CM | POA: Diagnosis not present

## 2020-08-17 DIAGNOSIS — Z992 Dependence on renal dialysis: Secondary | ICD-10-CM | POA: Diagnosis not present

## 2020-08-17 DIAGNOSIS — E1122 Type 2 diabetes mellitus with diabetic chronic kidney disease: Secondary | ICD-10-CM | POA: Diagnosis not present

## 2020-08-17 DIAGNOSIS — D509 Iron deficiency anemia, unspecified: Secondary | ICD-10-CM | POA: Diagnosis not present

## 2020-08-17 DIAGNOSIS — D631 Anemia in chronic kidney disease: Secondary | ICD-10-CM | POA: Diagnosis not present

## 2020-08-17 DIAGNOSIS — N186 End stage renal disease: Secondary | ICD-10-CM | POA: Diagnosis not present

## 2020-08-17 DIAGNOSIS — N2581 Secondary hyperparathyroidism of renal origin: Secondary | ICD-10-CM | POA: Diagnosis not present

## 2020-08-19 DIAGNOSIS — N2581 Secondary hyperparathyroidism of renal origin: Secondary | ICD-10-CM | POA: Diagnosis not present

## 2020-08-19 DIAGNOSIS — D689 Coagulation defect, unspecified: Secondary | ICD-10-CM | POA: Diagnosis not present

## 2020-08-19 DIAGNOSIS — Z992 Dependence on renal dialysis: Secondary | ICD-10-CM | POA: Diagnosis not present

## 2020-08-19 DIAGNOSIS — N186 End stage renal disease: Secondary | ICD-10-CM | POA: Diagnosis not present

## 2020-08-19 DIAGNOSIS — E1122 Type 2 diabetes mellitus with diabetic chronic kidney disease: Secondary | ICD-10-CM | POA: Diagnosis not present

## 2020-08-19 DIAGNOSIS — D509 Iron deficiency anemia, unspecified: Secondary | ICD-10-CM | POA: Diagnosis not present

## 2020-08-19 DIAGNOSIS — D631 Anemia in chronic kidney disease: Secondary | ICD-10-CM | POA: Diagnosis not present

## 2020-08-21 DIAGNOSIS — N186 End stage renal disease: Secondary | ICD-10-CM | POA: Diagnosis not present

## 2020-08-21 DIAGNOSIS — E1129 Type 2 diabetes mellitus with other diabetic kidney complication: Secondary | ICD-10-CM | POA: Diagnosis not present

## 2020-08-21 DIAGNOSIS — N2581 Secondary hyperparathyroidism of renal origin: Secondary | ICD-10-CM | POA: Diagnosis not present

## 2020-08-21 DIAGNOSIS — E1122 Type 2 diabetes mellitus with diabetic chronic kidney disease: Secondary | ICD-10-CM | POA: Diagnosis not present

## 2020-08-21 DIAGNOSIS — D689 Coagulation defect, unspecified: Secondary | ICD-10-CM | POA: Diagnosis not present

## 2020-08-21 DIAGNOSIS — Z992 Dependence on renal dialysis: Secondary | ICD-10-CM | POA: Diagnosis not present

## 2020-08-21 DIAGNOSIS — D509 Iron deficiency anemia, unspecified: Secondary | ICD-10-CM | POA: Diagnosis not present

## 2020-08-21 DIAGNOSIS — D631 Anemia in chronic kidney disease: Secondary | ICD-10-CM | POA: Diagnosis not present

## 2020-08-24 DIAGNOSIS — N186 End stage renal disease: Secondary | ICD-10-CM | POA: Diagnosis not present

## 2020-08-24 DIAGNOSIS — Z992 Dependence on renal dialysis: Secondary | ICD-10-CM | POA: Diagnosis not present

## 2020-08-24 DIAGNOSIS — D689 Coagulation defect, unspecified: Secondary | ICD-10-CM | POA: Diagnosis not present

## 2020-08-24 DIAGNOSIS — D509 Iron deficiency anemia, unspecified: Secondary | ICD-10-CM | POA: Diagnosis not present

## 2020-08-24 DIAGNOSIS — N2581 Secondary hyperparathyroidism of renal origin: Secondary | ICD-10-CM | POA: Diagnosis not present

## 2020-08-26 DIAGNOSIS — N186 End stage renal disease: Secondary | ICD-10-CM | POA: Diagnosis not present

## 2020-08-26 DIAGNOSIS — D689 Coagulation defect, unspecified: Secondary | ICD-10-CM | POA: Diagnosis not present

## 2020-08-26 DIAGNOSIS — N2581 Secondary hyperparathyroidism of renal origin: Secondary | ICD-10-CM | POA: Diagnosis not present

## 2020-08-26 DIAGNOSIS — D509 Iron deficiency anemia, unspecified: Secondary | ICD-10-CM | POA: Diagnosis not present

## 2020-08-26 DIAGNOSIS — Z992 Dependence on renal dialysis: Secondary | ICD-10-CM | POA: Diagnosis not present

## 2020-08-28 DIAGNOSIS — N186 End stage renal disease: Secondary | ICD-10-CM | POA: Diagnosis not present

## 2020-08-28 DIAGNOSIS — D509 Iron deficiency anemia, unspecified: Secondary | ICD-10-CM | POA: Diagnosis not present

## 2020-08-28 DIAGNOSIS — D689 Coagulation defect, unspecified: Secondary | ICD-10-CM | POA: Diagnosis not present

## 2020-08-28 DIAGNOSIS — N2581 Secondary hyperparathyroidism of renal origin: Secondary | ICD-10-CM | POA: Diagnosis not present

## 2020-08-28 DIAGNOSIS — Z992 Dependence on renal dialysis: Secondary | ICD-10-CM | POA: Diagnosis not present

## 2020-08-31 DIAGNOSIS — D509 Iron deficiency anemia, unspecified: Secondary | ICD-10-CM | POA: Diagnosis not present

## 2020-08-31 DIAGNOSIS — D689 Coagulation defect, unspecified: Secondary | ICD-10-CM | POA: Diagnosis not present

## 2020-08-31 DIAGNOSIS — N2581 Secondary hyperparathyroidism of renal origin: Secondary | ICD-10-CM | POA: Diagnosis not present

## 2020-08-31 DIAGNOSIS — N186 End stage renal disease: Secondary | ICD-10-CM | POA: Diagnosis not present

## 2020-08-31 DIAGNOSIS — Z992 Dependence on renal dialysis: Secondary | ICD-10-CM | POA: Diagnosis not present

## 2020-09-02 DIAGNOSIS — Z992 Dependence on renal dialysis: Secondary | ICD-10-CM | POA: Diagnosis not present

## 2020-09-02 DIAGNOSIS — D509 Iron deficiency anemia, unspecified: Secondary | ICD-10-CM | POA: Diagnosis not present

## 2020-09-02 DIAGNOSIS — N186 End stage renal disease: Secondary | ICD-10-CM | POA: Diagnosis not present

## 2020-09-02 DIAGNOSIS — D689 Coagulation defect, unspecified: Secondary | ICD-10-CM | POA: Diagnosis not present

## 2020-09-02 DIAGNOSIS — N2581 Secondary hyperparathyroidism of renal origin: Secondary | ICD-10-CM | POA: Diagnosis not present

## 2020-09-04 DIAGNOSIS — D689 Coagulation defect, unspecified: Secondary | ICD-10-CM | POA: Diagnosis not present

## 2020-09-04 DIAGNOSIS — D509 Iron deficiency anemia, unspecified: Secondary | ICD-10-CM | POA: Diagnosis not present

## 2020-09-04 DIAGNOSIS — N2581 Secondary hyperparathyroidism of renal origin: Secondary | ICD-10-CM | POA: Diagnosis not present

## 2020-09-04 DIAGNOSIS — N186 End stage renal disease: Secondary | ICD-10-CM | POA: Diagnosis not present

## 2020-09-04 DIAGNOSIS — Z992 Dependence on renal dialysis: Secondary | ICD-10-CM | POA: Diagnosis not present

## 2020-09-07 DIAGNOSIS — N186 End stage renal disease: Secondary | ICD-10-CM | POA: Diagnosis not present

## 2020-09-07 DIAGNOSIS — N2581 Secondary hyperparathyroidism of renal origin: Secondary | ICD-10-CM | POA: Diagnosis not present

## 2020-09-07 DIAGNOSIS — D689 Coagulation defect, unspecified: Secondary | ICD-10-CM | POA: Diagnosis not present

## 2020-09-07 DIAGNOSIS — E1122 Type 2 diabetes mellitus with diabetic chronic kidney disease: Secondary | ICD-10-CM | POA: Diagnosis not present

## 2020-09-07 DIAGNOSIS — Z992 Dependence on renal dialysis: Secondary | ICD-10-CM | POA: Diagnosis not present

## 2020-09-07 DIAGNOSIS — D509 Iron deficiency anemia, unspecified: Secondary | ICD-10-CM | POA: Diagnosis not present

## 2020-09-08 ENCOUNTER — Other Ambulatory Visit: Payer: Self-pay | Admitting: Nurse Practitioner

## 2020-09-08 DIAGNOSIS — H10413 Chronic giant papillary conjunctivitis, bilateral: Secondary | ICD-10-CM | POA: Diagnosis not present

## 2020-09-08 DIAGNOSIS — I639 Cerebral infarction, unspecified: Secondary | ICD-10-CM

## 2020-09-08 DIAGNOSIS — Z961 Presence of intraocular lens: Secondary | ICD-10-CM | POA: Diagnosis not present

## 2020-09-08 DIAGNOSIS — H401131 Primary open-angle glaucoma, bilateral, mild stage: Secondary | ICD-10-CM | POA: Diagnosis not present

## 2020-09-08 NOTE — Telephone Encounter (Signed)
Patient has request refill on medication "Plavix" and "Paxil". Patient last refill for plavix was 03/22/2020. Patient last refill for paxil was 03/17/2020. Patient is due for refills. Medication was warnings. Medication pend and sent to PCP Dewaine Oats Carlos American, NP for approval.

## 2020-09-09 DIAGNOSIS — D689 Coagulation defect, unspecified: Secondary | ICD-10-CM | POA: Diagnosis not present

## 2020-09-09 DIAGNOSIS — N2581 Secondary hyperparathyroidism of renal origin: Secondary | ICD-10-CM | POA: Diagnosis not present

## 2020-09-09 DIAGNOSIS — D509 Iron deficiency anemia, unspecified: Secondary | ICD-10-CM | POA: Diagnosis not present

## 2020-09-09 DIAGNOSIS — E1122 Type 2 diabetes mellitus with diabetic chronic kidney disease: Secondary | ICD-10-CM | POA: Diagnosis not present

## 2020-09-09 DIAGNOSIS — Z992 Dependence on renal dialysis: Secondary | ICD-10-CM | POA: Diagnosis not present

## 2020-09-09 DIAGNOSIS — N186 End stage renal disease: Secondary | ICD-10-CM | POA: Diagnosis not present

## 2020-09-11 DIAGNOSIS — E1122 Type 2 diabetes mellitus with diabetic chronic kidney disease: Secondary | ICD-10-CM | POA: Diagnosis not present

## 2020-09-11 DIAGNOSIS — Z992 Dependence on renal dialysis: Secondary | ICD-10-CM | POA: Diagnosis not present

## 2020-09-11 DIAGNOSIS — N2581 Secondary hyperparathyroidism of renal origin: Secondary | ICD-10-CM | POA: Diagnosis not present

## 2020-09-11 DIAGNOSIS — D689 Coagulation defect, unspecified: Secondary | ICD-10-CM | POA: Diagnosis not present

## 2020-09-11 DIAGNOSIS — D509 Iron deficiency anemia, unspecified: Secondary | ICD-10-CM | POA: Diagnosis not present

## 2020-09-11 DIAGNOSIS — N186 End stage renal disease: Secondary | ICD-10-CM | POA: Diagnosis not present

## 2020-09-14 DIAGNOSIS — N186 End stage renal disease: Secondary | ICD-10-CM | POA: Diagnosis not present

## 2020-09-14 DIAGNOSIS — Z992 Dependence on renal dialysis: Secondary | ICD-10-CM | POA: Diagnosis not present

## 2020-09-14 DIAGNOSIS — N2581 Secondary hyperparathyroidism of renal origin: Secondary | ICD-10-CM | POA: Diagnosis not present

## 2020-09-14 DIAGNOSIS — D689 Coagulation defect, unspecified: Secondary | ICD-10-CM | POA: Diagnosis not present

## 2020-09-14 DIAGNOSIS — D509 Iron deficiency anemia, unspecified: Secondary | ICD-10-CM | POA: Diagnosis not present

## 2020-09-16 DIAGNOSIS — D509 Iron deficiency anemia, unspecified: Secondary | ICD-10-CM | POA: Diagnosis not present

## 2020-09-16 DIAGNOSIS — N2581 Secondary hyperparathyroidism of renal origin: Secondary | ICD-10-CM | POA: Diagnosis not present

## 2020-09-16 DIAGNOSIS — N186 End stage renal disease: Secondary | ICD-10-CM | POA: Diagnosis not present

## 2020-09-16 DIAGNOSIS — D689 Coagulation defect, unspecified: Secondary | ICD-10-CM | POA: Diagnosis not present

## 2020-09-16 DIAGNOSIS — Z992 Dependence on renal dialysis: Secondary | ICD-10-CM | POA: Diagnosis not present

## 2020-09-17 DIAGNOSIS — Z961 Presence of intraocular lens: Secondary | ICD-10-CM | POA: Diagnosis not present

## 2020-09-17 DIAGNOSIS — H3581 Retinal edema: Secondary | ICD-10-CM | POA: Diagnosis not present

## 2020-09-17 DIAGNOSIS — H3562 Retinal hemorrhage, left eye: Secondary | ICD-10-CM | POA: Diagnosis not present

## 2020-09-17 DIAGNOSIS — H35352 Cystoid macular degeneration, left eye: Secondary | ICD-10-CM | POA: Diagnosis not present

## 2020-09-17 DIAGNOSIS — H34832 Tributary (branch) retinal vein occlusion, left eye, with macular edema: Secondary | ICD-10-CM | POA: Diagnosis not present

## 2020-09-18 DIAGNOSIS — D689 Coagulation defect, unspecified: Secondary | ICD-10-CM | POA: Diagnosis not present

## 2020-09-18 DIAGNOSIS — D509 Iron deficiency anemia, unspecified: Secondary | ICD-10-CM | POA: Diagnosis not present

## 2020-09-18 DIAGNOSIS — N186 End stage renal disease: Secondary | ICD-10-CM | POA: Diagnosis not present

## 2020-09-18 DIAGNOSIS — Z992 Dependence on renal dialysis: Secondary | ICD-10-CM | POA: Diagnosis not present

## 2020-09-18 DIAGNOSIS — N2581 Secondary hyperparathyroidism of renal origin: Secondary | ICD-10-CM | POA: Diagnosis not present

## 2020-09-21 DIAGNOSIS — D689 Coagulation defect, unspecified: Secondary | ICD-10-CM | POA: Diagnosis not present

## 2020-09-21 DIAGNOSIS — E1129 Type 2 diabetes mellitus with other diabetic kidney complication: Secondary | ICD-10-CM | POA: Diagnosis not present

## 2020-09-21 DIAGNOSIS — Z992 Dependence on renal dialysis: Secondary | ICD-10-CM | POA: Diagnosis not present

## 2020-09-21 DIAGNOSIS — N186 End stage renal disease: Secondary | ICD-10-CM | POA: Diagnosis not present

## 2020-09-21 DIAGNOSIS — N2581 Secondary hyperparathyroidism of renal origin: Secondary | ICD-10-CM | POA: Diagnosis not present

## 2020-09-21 DIAGNOSIS — D509 Iron deficiency anemia, unspecified: Secondary | ICD-10-CM | POA: Diagnosis not present

## 2020-09-23 DIAGNOSIS — Z992 Dependence on renal dialysis: Secondary | ICD-10-CM | POA: Diagnosis not present

## 2020-09-23 DIAGNOSIS — D689 Coagulation defect, unspecified: Secondary | ICD-10-CM | POA: Diagnosis not present

## 2020-09-23 DIAGNOSIS — N186 End stage renal disease: Secondary | ICD-10-CM | POA: Diagnosis not present

## 2020-09-23 DIAGNOSIS — N2581 Secondary hyperparathyroidism of renal origin: Secondary | ICD-10-CM | POA: Diagnosis not present

## 2020-09-25 DIAGNOSIS — D689 Coagulation defect, unspecified: Secondary | ICD-10-CM | POA: Diagnosis not present

## 2020-09-25 DIAGNOSIS — N2581 Secondary hyperparathyroidism of renal origin: Secondary | ICD-10-CM | POA: Diagnosis not present

## 2020-09-25 DIAGNOSIS — N186 End stage renal disease: Secondary | ICD-10-CM | POA: Diagnosis not present

## 2020-09-25 DIAGNOSIS — Z992 Dependence on renal dialysis: Secondary | ICD-10-CM | POA: Diagnosis not present

## 2020-09-28 DIAGNOSIS — N2581 Secondary hyperparathyroidism of renal origin: Secondary | ICD-10-CM | POA: Diagnosis not present

## 2020-09-28 DIAGNOSIS — N186 End stage renal disease: Secondary | ICD-10-CM | POA: Diagnosis not present

## 2020-09-28 DIAGNOSIS — D689 Coagulation defect, unspecified: Secondary | ICD-10-CM | POA: Diagnosis not present

## 2020-09-28 DIAGNOSIS — D631 Anemia in chronic kidney disease: Secondary | ICD-10-CM | POA: Diagnosis not present

## 2020-09-28 DIAGNOSIS — D509 Iron deficiency anemia, unspecified: Secondary | ICD-10-CM | POA: Diagnosis not present

## 2020-09-28 DIAGNOSIS — Z992 Dependence on renal dialysis: Secondary | ICD-10-CM | POA: Diagnosis not present

## 2020-09-30 DIAGNOSIS — N2581 Secondary hyperparathyroidism of renal origin: Secondary | ICD-10-CM | POA: Diagnosis not present

## 2020-09-30 DIAGNOSIS — Z992 Dependence on renal dialysis: Secondary | ICD-10-CM | POA: Diagnosis not present

## 2020-09-30 DIAGNOSIS — D689 Coagulation defect, unspecified: Secondary | ICD-10-CM | POA: Diagnosis not present

## 2020-09-30 DIAGNOSIS — D509 Iron deficiency anemia, unspecified: Secondary | ICD-10-CM | POA: Diagnosis not present

## 2020-09-30 DIAGNOSIS — N186 End stage renal disease: Secondary | ICD-10-CM | POA: Diagnosis not present

## 2020-09-30 DIAGNOSIS — D631 Anemia in chronic kidney disease: Secondary | ICD-10-CM | POA: Diagnosis not present

## 2020-10-01 DIAGNOSIS — H34832 Tributary (branch) retinal vein occlusion, left eye, with macular edema: Secondary | ICD-10-CM | POA: Diagnosis not present

## 2020-10-02 DIAGNOSIS — D689 Coagulation defect, unspecified: Secondary | ICD-10-CM | POA: Diagnosis not present

## 2020-10-02 DIAGNOSIS — N2581 Secondary hyperparathyroidism of renal origin: Secondary | ICD-10-CM | POA: Diagnosis not present

## 2020-10-02 DIAGNOSIS — Z992 Dependence on renal dialysis: Secondary | ICD-10-CM | POA: Diagnosis not present

## 2020-10-02 DIAGNOSIS — D509 Iron deficiency anemia, unspecified: Secondary | ICD-10-CM | POA: Diagnosis not present

## 2020-10-02 DIAGNOSIS — D631 Anemia in chronic kidney disease: Secondary | ICD-10-CM | POA: Diagnosis not present

## 2020-10-02 DIAGNOSIS — N186 End stage renal disease: Secondary | ICD-10-CM | POA: Diagnosis not present

## 2020-10-05 DIAGNOSIS — N2581 Secondary hyperparathyroidism of renal origin: Secondary | ICD-10-CM | POA: Diagnosis not present

## 2020-10-05 DIAGNOSIS — D689 Coagulation defect, unspecified: Secondary | ICD-10-CM | POA: Diagnosis not present

## 2020-10-05 DIAGNOSIS — N186 End stage renal disease: Secondary | ICD-10-CM | POA: Diagnosis not present

## 2020-10-05 DIAGNOSIS — Z992 Dependence on renal dialysis: Secondary | ICD-10-CM | POA: Diagnosis not present

## 2020-10-05 DIAGNOSIS — D509 Iron deficiency anemia, unspecified: Secondary | ICD-10-CM | POA: Diagnosis not present

## 2020-10-05 DIAGNOSIS — E1122 Type 2 diabetes mellitus with diabetic chronic kidney disease: Secondary | ICD-10-CM | POA: Diagnosis not present

## 2020-10-07 DIAGNOSIS — Z992 Dependence on renal dialysis: Secondary | ICD-10-CM | POA: Diagnosis not present

## 2020-10-07 DIAGNOSIS — N2581 Secondary hyperparathyroidism of renal origin: Secondary | ICD-10-CM | POA: Diagnosis not present

## 2020-10-07 DIAGNOSIS — D509 Iron deficiency anemia, unspecified: Secondary | ICD-10-CM | POA: Diagnosis not present

## 2020-10-07 DIAGNOSIS — N186 End stage renal disease: Secondary | ICD-10-CM | POA: Diagnosis not present

## 2020-10-07 DIAGNOSIS — E1122 Type 2 diabetes mellitus with diabetic chronic kidney disease: Secondary | ICD-10-CM | POA: Diagnosis not present

## 2020-10-07 DIAGNOSIS — D689 Coagulation defect, unspecified: Secondary | ICD-10-CM | POA: Diagnosis not present

## 2020-10-09 DIAGNOSIS — Z992 Dependence on renal dialysis: Secondary | ICD-10-CM | POA: Diagnosis not present

## 2020-10-09 DIAGNOSIS — D509 Iron deficiency anemia, unspecified: Secondary | ICD-10-CM | POA: Diagnosis not present

## 2020-10-09 DIAGNOSIS — E1122 Type 2 diabetes mellitus with diabetic chronic kidney disease: Secondary | ICD-10-CM | POA: Diagnosis not present

## 2020-10-09 DIAGNOSIS — N186 End stage renal disease: Secondary | ICD-10-CM | POA: Diagnosis not present

## 2020-10-09 DIAGNOSIS — D689 Coagulation defect, unspecified: Secondary | ICD-10-CM | POA: Diagnosis not present

## 2020-10-09 DIAGNOSIS — N2581 Secondary hyperparathyroidism of renal origin: Secondary | ICD-10-CM | POA: Diagnosis not present

## 2020-10-12 DIAGNOSIS — N186 End stage renal disease: Secondary | ICD-10-CM | POA: Diagnosis not present

## 2020-10-12 DIAGNOSIS — D509 Iron deficiency anemia, unspecified: Secondary | ICD-10-CM | POA: Diagnosis not present

## 2020-10-12 DIAGNOSIS — N2581 Secondary hyperparathyroidism of renal origin: Secondary | ICD-10-CM | POA: Diagnosis not present

## 2020-10-12 DIAGNOSIS — Z992 Dependence on renal dialysis: Secondary | ICD-10-CM | POA: Diagnosis not present

## 2020-10-12 DIAGNOSIS — D689 Coagulation defect, unspecified: Secondary | ICD-10-CM | POA: Diagnosis not present

## 2020-10-14 DIAGNOSIS — D689 Coagulation defect, unspecified: Secondary | ICD-10-CM | POA: Diagnosis not present

## 2020-10-14 DIAGNOSIS — N2581 Secondary hyperparathyroidism of renal origin: Secondary | ICD-10-CM | POA: Diagnosis not present

## 2020-10-14 DIAGNOSIS — D509 Iron deficiency anemia, unspecified: Secondary | ICD-10-CM | POA: Diagnosis not present

## 2020-10-14 DIAGNOSIS — N186 End stage renal disease: Secondary | ICD-10-CM | POA: Diagnosis not present

## 2020-10-14 DIAGNOSIS — Z992 Dependence on renal dialysis: Secondary | ICD-10-CM | POA: Diagnosis not present

## 2020-10-16 DIAGNOSIS — N186 End stage renal disease: Secondary | ICD-10-CM | POA: Diagnosis not present

## 2020-10-16 DIAGNOSIS — D689 Coagulation defect, unspecified: Secondary | ICD-10-CM | POA: Diagnosis not present

## 2020-10-16 DIAGNOSIS — N2581 Secondary hyperparathyroidism of renal origin: Secondary | ICD-10-CM | POA: Diagnosis not present

## 2020-10-16 DIAGNOSIS — D509 Iron deficiency anemia, unspecified: Secondary | ICD-10-CM | POA: Diagnosis not present

## 2020-10-16 DIAGNOSIS — Z992 Dependence on renal dialysis: Secondary | ICD-10-CM | POA: Diagnosis not present

## 2020-10-19 DIAGNOSIS — N186 End stage renal disease: Secondary | ICD-10-CM | POA: Diagnosis not present

## 2020-10-19 DIAGNOSIS — N2581 Secondary hyperparathyroidism of renal origin: Secondary | ICD-10-CM | POA: Diagnosis not present

## 2020-10-19 DIAGNOSIS — D689 Coagulation defect, unspecified: Secondary | ICD-10-CM | POA: Diagnosis not present

## 2020-10-19 DIAGNOSIS — D509 Iron deficiency anemia, unspecified: Secondary | ICD-10-CM | POA: Diagnosis not present

## 2020-10-19 DIAGNOSIS — Z992 Dependence on renal dialysis: Secondary | ICD-10-CM | POA: Diagnosis not present

## 2020-10-20 ENCOUNTER — Encounter: Payer: Self-pay | Admitting: Gastroenterology

## 2020-10-21 DIAGNOSIS — N186 End stage renal disease: Secondary | ICD-10-CM | POA: Diagnosis not present

## 2020-10-21 DIAGNOSIS — N2581 Secondary hyperparathyroidism of renal origin: Secondary | ICD-10-CM | POA: Diagnosis not present

## 2020-10-21 DIAGNOSIS — D509 Iron deficiency anemia, unspecified: Secondary | ICD-10-CM | POA: Diagnosis not present

## 2020-10-21 DIAGNOSIS — E1129 Type 2 diabetes mellitus with other diabetic kidney complication: Secondary | ICD-10-CM | POA: Diagnosis not present

## 2020-10-21 DIAGNOSIS — Z992 Dependence on renal dialysis: Secondary | ICD-10-CM | POA: Diagnosis not present

## 2020-10-21 DIAGNOSIS — D689 Coagulation defect, unspecified: Secondary | ICD-10-CM | POA: Diagnosis not present

## 2020-10-23 DIAGNOSIS — N186 End stage renal disease: Secondary | ICD-10-CM | POA: Diagnosis not present

## 2020-10-23 DIAGNOSIS — Z992 Dependence on renal dialysis: Secondary | ICD-10-CM | POA: Diagnosis not present

## 2020-10-23 DIAGNOSIS — D689 Coagulation defect, unspecified: Secondary | ICD-10-CM | POA: Diagnosis not present

## 2020-10-23 DIAGNOSIS — N2581 Secondary hyperparathyroidism of renal origin: Secondary | ICD-10-CM | POA: Diagnosis not present

## 2020-10-23 DIAGNOSIS — D509 Iron deficiency anemia, unspecified: Secondary | ICD-10-CM | POA: Diagnosis not present

## 2020-10-23 DIAGNOSIS — D631 Anemia in chronic kidney disease: Secondary | ICD-10-CM | POA: Diagnosis not present

## 2020-10-23 DIAGNOSIS — E1122 Type 2 diabetes mellitus with diabetic chronic kidney disease: Secondary | ICD-10-CM | POA: Diagnosis not present

## 2020-10-26 DIAGNOSIS — D689 Coagulation defect, unspecified: Secondary | ICD-10-CM | POA: Diagnosis not present

## 2020-10-26 DIAGNOSIS — Z992 Dependence on renal dialysis: Secondary | ICD-10-CM | POA: Diagnosis not present

## 2020-10-26 DIAGNOSIS — N186 End stage renal disease: Secondary | ICD-10-CM | POA: Diagnosis not present

## 2020-10-26 DIAGNOSIS — D631 Anemia in chronic kidney disease: Secondary | ICD-10-CM | POA: Diagnosis not present

## 2020-10-26 DIAGNOSIS — N2581 Secondary hyperparathyroidism of renal origin: Secondary | ICD-10-CM | POA: Diagnosis not present

## 2020-10-26 DIAGNOSIS — D509 Iron deficiency anemia, unspecified: Secondary | ICD-10-CM | POA: Diagnosis not present

## 2020-10-26 DIAGNOSIS — E1122 Type 2 diabetes mellitus with diabetic chronic kidney disease: Secondary | ICD-10-CM | POA: Diagnosis not present

## 2020-10-28 DIAGNOSIS — N186 End stage renal disease: Secondary | ICD-10-CM | POA: Diagnosis not present

## 2020-10-28 DIAGNOSIS — D509 Iron deficiency anemia, unspecified: Secondary | ICD-10-CM | POA: Diagnosis not present

## 2020-10-28 DIAGNOSIS — N2581 Secondary hyperparathyroidism of renal origin: Secondary | ICD-10-CM | POA: Diagnosis not present

## 2020-10-28 DIAGNOSIS — D689 Coagulation defect, unspecified: Secondary | ICD-10-CM | POA: Diagnosis not present

## 2020-10-28 DIAGNOSIS — E1122 Type 2 diabetes mellitus with diabetic chronic kidney disease: Secondary | ICD-10-CM | POA: Diagnosis not present

## 2020-10-28 DIAGNOSIS — Z992 Dependence on renal dialysis: Secondary | ICD-10-CM | POA: Diagnosis not present

## 2020-10-28 DIAGNOSIS — D631 Anemia in chronic kidney disease: Secondary | ICD-10-CM | POA: Diagnosis not present

## 2020-10-30 DIAGNOSIS — D689 Coagulation defect, unspecified: Secondary | ICD-10-CM | POA: Diagnosis not present

## 2020-10-30 DIAGNOSIS — D631 Anemia in chronic kidney disease: Secondary | ICD-10-CM | POA: Diagnosis not present

## 2020-10-30 DIAGNOSIS — N2581 Secondary hyperparathyroidism of renal origin: Secondary | ICD-10-CM | POA: Diagnosis not present

## 2020-10-30 DIAGNOSIS — Z992 Dependence on renal dialysis: Secondary | ICD-10-CM | POA: Diagnosis not present

## 2020-10-30 DIAGNOSIS — E1122 Type 2 diabetes mellitus with diabetic chronic kidney disease: Secondary | ICD-10-CM | POA: Diagnosis not present

## 2020-10-30 DIAGNOSIS — N186 End stage renal disease: Secondary | ICD-10-CM | POA: Diagnosis not present

## 2020-10-30 DIAGNOSIS — D509 Iron deficiency anemia, unspecified: Secondary | ICD-10-CM | POA: Diagnosis not present

## 2020-11-02 DIAGNOSIS — N186 End stage renal disease: Secondary | ICD-10-CM | POA: Diagnosis not present

## 2020-11-02 DIAGNOSIS — N2581 Secondary hyperparathyroidism of renal origin: Secondary | ICD-10-CM | POA: Diagnosis not present

## 2020-11-02 DIAGNOSIS — D509 Iron deficiency anemia, unspecified: Secondary | ICD-10-CM | POA: Diagnosis not present

## 2020-11-02 DIAGNOSIS — D689 Coagulation defect, unspecified: Secondary | ICD-10-CM | POA: Diagnosis not present

## 2020-11-02 DIAGNOSIS — Z992 Dependence on renal dialysis: Secondary | ICD-10-CM | POA: Diagnosis not present

## 2020-11-02 DIAGNOSIS — E1122 Type 2 diabetes mellitus with diabetic chronic kidney disease: Secondary | ICD-10-CM | POA: Diagnosis not present

## 2020-11-02 DIAGNOSIS — D631 Anemia in chronic kidney disease: Secondary | ICD-10-CM | POA: Diagnosis not present

## 2020-11-04 DIAGNOSIS — D689 Coagulation defect, unspecified: Secondary | ICD-10-CM | POA: Diagnosis not present

## 2020-11-04 DIAGNOSIS — D509 Iron deficiency anemia, unspecified: Secondary | ICD-10-CM | POA: Diagnosis not present

## 2020-11-04 DIAGNOSIS — D631 Anemia in chronic kidney disease: Secondary | ICD-10-CM | POA: Diagnosis not present

## 2020-11-04 DIAGNOSIS — E1122 Type 2 diabetes mellitus with diabetic chronic kidney disease: Secondary | ICD-10-CM | POA: Diagnosis not present

## 2020-11-04 DIAGNOSIS — N2581 Secondary hyperparathyroidism of renal origin: Secondary | ICD-10-CM | POA: Diagnosis not present

## 2020-11-04 DIAGNOSIS — N186 End stage renal disease: Secondary | ICD-10-CM | POA: Diagnosis not present

## 2020-11-04 DIAGNOSIS — Z992 Dependence on renal dialysis: Secondary | ICD-10-CM | POA: Diagnosis not present

## 2020-11-05 DIAGNOSIS — H34832 Tributary (branch) retinal vein occlusion, left eye, with macular edema: Secondary | ICD-10-CM | POA: Diagnosis not present

## 2020-11-06 DIAGNOSIS — Z992 Dependence on renal dialysis: Secondary | ICD-10-CM | POA: Diagnosis not present

## 2020-11-06 DIAGNOSIS — D509 Iron deficiency anemia, unspecified: Secondary | ICD-10-CM | POA: Diagnosis not present

## 2020-11-06 DIAGNOSIS — D631 Anemia in chronic kidney disease: Secondary | ICD-10-CM | POA: Diagnosis not present

## 2020-11-06 DIAGNOSIS — N186 End stage renal disease: Secondary | ICD-10-CM | POA: Diagnosis not present

## 2020-11-06 DIAGNOSIS — D689 Coagulation defect, unspecified: Secondary | ICD-10-CM | POA: Diagnosis not present

## 2020-11-06 DIAGNOSIS — E1122 Type 2 diabetes mellitus with diabetic chronic kidney disease: Secondary | ICD-10-CM | POA: Diagnosis not present

## 2020-11-06 DIAGNOSIS — N2581 Secondary hyperparathyroidism of renal origin: Secondary | ICD-10-CM | POA: Diagnosis not present

## 2020-11-09 DIAGNOSIS — N2581 Secondary hyperparathyroidism of renal origin: Secondary | ICD-10-CM | POA: Diagnosis not present

## 2020-11-09 DIAGNOSIS — D631 Anemia in chronic kidney disease: Secondary | ICD-10-CM | POA: Diagnosis not present

## 2020-11-09 DIAGNOSIS — D689 Coagulation defect, unspecified: Secondary | ICD-10-CM | POA: Diagnosis not present

## 2020-11-09 DIAGNOSIS — N186 End stage renal disease: Secondary | ICD-10-CM | POA: Diagnosis not present

## 2020-11-09 DIAGNOSIS — E1122 Type 2 diabetes mellitus with diabetic chronic kidney disease: Secondary | ICD-10-CM | POA: Diagnosis not present

## 2020-11-09 DIAGNOSIS — Z992 Dependence on renal dialysis: Secondary | ICD-10-CM | POA: Diagnosis not present

## 2020-11-09 DIAGNOSIS — D509 Iron deficiency anemia, unspecified: Secondary | ICD-10-CM | POA: Diagnosis not present

## 2020-11-10 ENCOUNTER — Other Ambulatory Visit: Payer: Self-pay | Admitting: *Deleted

## 2020-11-10 MED ORDER — PROLIA 60 MG/ML ~~LOC~~ SOSY: PREFILLED_SYRINGE | SUBCUTANEOUS | 0 refills | Status: DC

## 2020-11-10 NOTE — Telephone Encounter (Signed)
[name] Requested refill to be sent to pharmacy.

## 2020-11-11 DIAGNOSIS — Z992 Dependence on renal dialysis: Secondary | ICD-10-CM | POA: Diagnosis not present

## 2020-11-11 DIAGNOSIS — D631 Anemia in chronic kidney disease: Secondary | ICD-10-CM | POA: Diagnosis not present

## 2020-11-11 DIAGNOSIS — D509 Iron deficiency anemia, unspecified: Secondary | ICD-10-CM | POA: Diagnosis not present

## 2020-11-11 DIAGNOSIS — E1122 Type 2 diabetes mellitus with diabetic chronic kidney disease: Secondary | ICD-10-CM | POA: Diagnosis not present

## 2020-11-11 DIAGNOSIS — D689 Coagulation defect, unspecified: Secondary | ICD-10-CM | POA: Diagnosis not present

## 2020-11-11 DIAGNOSIS — N186 End stage renal disease: Secondary | ICD-10-CM | POA: Diagnosis not present

## 2020-11-11 DIAGNOSIS — N2581 Secondary hyperparathyroidism of renal origin: Secondary | ICD-10-CM | POA: Diagnosis not present

## 2020-11-13 DIAGNOSIS — D689 Coagulation defect, unspecified: Secondary | ICD-10-CM | POA: Diagnosis not present

## 2020-11-13 DIAGNOSIS — Z992 Dependence on renal dialysis: Secondary | ICD-10-CM | POA: Diagnosis not present

## 2020-11-13 DIAGNOSIS — D631 Anemia in chronic kidney disease: Secondary | ICD-10-CM | POA: Diagnosis not present

## 2020-11-13 DIAGNOSIS — D509 Iron deficiency anemia, unspecified: Secondary | ICD-10-CM | POA: Diagnosis not present

## 2020-11-13 DIAGNOSIS — N186 End stage renal disease: Secondary | ICD-10-CM | POA: Diagnosis not present

## 2020-11-13 DIAGNOSIS — E1122 Type 2 diabetes mellitus with diabetic chronic kidney disease: Secondary | ICD-10-CM | POA: Diagnosis not present

## 2020-11-13 DIAGNOSIS — N2581 Secondary hyperparathyroidism of renal origin: Secondary | ICD-10-CM | POA: Diagnosis not present

## 2020-11-16 DIAGNOSIS — N2581 Secondary hyperparathyroidism of renal origin: Secondary | ICD-10-CM | POA: Diagnosis not present

## 2020-11-16 DIAGNOSIS — E1122 Type 2 diabetes mellitus with diabetic chronic kidney disease: Secondary | ICD-10-CM | POA: Diagnosis not present

## 2020-11-16 DIAGNOSIS — D631 Anemia in chronic kidney disease: Secondary | ICD-10-CM | POA: Diagnosis not present

## 2020-11-16 DIAGNOSIS — N186 End stage renal disease: Secondary | ICD-10-CM | POA: Diagnosis not present

## 2020-11-16 DIAGNOSIS — Z992 Dependence on renal dialysis: Secondary | ICD-10-CM | POA: Diagnosis not present

## 2020-11-16 DIAGNOSIS — D689 Coagulation defect, unspecified: Secondary | ICD-10-CM | POA: Diagnosis not present

## 2020-11-16 DIAGNOSIS — D509 Iron deficiency anemia, unspecified: Secondary | ICD-10-CM | POA: Diagnosis not present

## 2020-11-18 ENCOUNTER — Telehealth: Payer: Self-pay | Admitting: *Deleted

## 2020-11-18 DIAGNOSIS — N186 End stage renal disease: Secondary | ICD-10-CM | POA: Diagnosis not present

## 2020-11-18 DIAGNOSIS — D689 Coagulation defect, unspecified: Secondary | ICD-10-CM | POA: Diagnosis not present

## 2020-11-18 DIAGNOSIS — E1122 Type 2 diabetes mellitus with diabetic chronic kidney disease: Secondary | ICD-10-CM | POA: Diagnosis not present

## 2020-11-18 DIAGNOSIS — D509 Iron deficiency anemia, unspecified: Secondary | ICD-10-CM | POA: Diagnosis not present

## 2020-11-18 DIAGNOSIS — N2581 Secondary hyperparathyroidism of renal origin: Secondary | ICD-10-CM | POA: Diagnosis not present

## 2020-11-18 DIAGNOSIS — D631 Anemia in chronic kidney disease: Secondary | ICD-10-CM | POA: Diagnosis not present

## 2020-11-18 DIAGNOSIS — Z992 Dependence on renal dialysis: Secondary | ICD-10-CM | POA: Diagnosis not present

## 2020-11-18 NOTE — Telephone Encounter (Signed)
Patient called to check on Prolia. We have not received Prolia from [facility] yet and it was ordered on 11/10/20 with [facility]irm[facility]. Patient has an appointment for tomorrow with Janett Billow to receive the Prolia and for a Follow up.   I called [facility] [phone] and spoke with [facility] and she stated that the Prolia is out for delivery for 11/19/20. I explained to her that we have a problem with patient getting her Prolia in a timely manner every time it is ordered and asked what is an easier process. She stated that we are doing it right but they have a hard time contacting the patient to release the medication. She confirmed patient's phone number and what they had on file was wrong. She stated that was the problem and corrected it and stated that it soon be easier next time.   I called the patient and informed her what was going on and she wanted to rescheduled her appointment to 12/03/2020 with Janett Billow since she was unsure when the Prolia would be in the office tomorrow. She wanted to reschedule with Janett Billow. Patient was ok and will call us if she needs [place] sooner.

## 2020-11-18 NOTE — Telephone Encounter (Signed)
Noted, thank you

## 2020-11-19 ENCOUNTER — Ambulatory Visit: Payer: Medicare Other | Admitting: Nurse Practitioner

## 2020-11-19 NOTE — Telephone Encounter (Signed)
[name] with [facility] [phone] called to confirm a delivery date for patient's Prolia for 11/23/2020.   I told [name] that I had already spoken with someone yesterday and she stated that the Prolia was going to be delivered today.  [name] could not explain the call and stated that it was not documented on her end.  I told [name] that this was a frustrating Process because the patient cannot get her medication in a timely manner and has to keep canceling her appointment. She stated that she understood and confirmed patient's telephone number.

## 2020-11-20 DIAGNOSIS — D631 Anemia in chronic kidney disease: Secondary | ICD-10-CM | POA: Diagnosis not present

## 2020-11-20 DIAGNOSIS — D509 Iron deficiency anemia, unspecified: Secondary | ICD-10-CM | POA: Diagnosis not present

## 2020-11-20 DIAGNOSIS — N2581 Secondary hyperparathyroidism of renal origin: Secondary | ICD-10-CM | POA: Diagnosis not present

## 2020-11-20 DIAGNOSIS — D689 Coagulation defect, unspecified: Secondary | ICD-10-CM | POA: Diagnosis not present

## 2020-11-20 DIAGNOSIS — Z992 Dependence on renal dialysis: Secondary | ICD-10-CM | POA: Diagnosis not present

## 2020-11-20 DIAGNOSIS — E1122 Type 2 diabetes mellitus with diabetic chronic kidney disease: Secondary | ICD-10-CM | POA: Diagnosis not present

## 2020-11-20 DIAGNOSIS — N186 End stage renal disease: Secondary | ICD-10-CM | POA: Diagnosis not present

## 2020-11-21 DIAGNOSIS — E1129 Type 2 diabetes mellitus with other diabetic kidney complication: Secondary | ICD-10-CM | POA: Diagnosis not present

## 2020-11-21 DIAGNOSIS — Z992 Dependence on renal dialysis: Secondary | ICD-10-CM | POA: Diagnosis not present

## 2020-11-21 DIAGNOSIS — N186 End stage renal disease: Secondary | ICD-10-CM | POA: Diagnosis not present

## 2020-11-23 DIAGNOSIS — Z992 Dependence on renal dialysis: Secondary | ICD-10-CM | POA: Diagnosis not present

## 2020-11-23 DIAGNOSIS — D631 Anemia in chronic kidney disease: Secondary | ICD-10-CM | POA: Diagnosis not present

## 2020-11-23 DIAGNOSIS — D689 Coagulation defect, unspecified: Secondary | ICD-10-CM | POA: Diagnosis not present

## 2020-11-23 DIAGNOSIS — N186 End stage renal disease: Secondary | ICD-10-CM | POA: Diagnosis not present

## 2020-11-23 DIAGNOSIS — N2581 Secondary hyperparathyroidism of renal origin: Secondary | ICD-10-CM | POA: Diagnosis not present

## 2020-11-23 DIAGNOSIS — E1122 Type 2 diabetes mellitus with diabetic chronic kidney disease: Secondary | ICD-10-CM | POA: Diagnosis not present

## 2020-11-23 DIAGNOSIS — D509 Iron deficiency anemia, unspecified: Secondary | ICD-10-CM | POA: Diagnosis not present

## 2020-11-23 DIAGNOSIS — R519 Headache, unspecified: Secondary | ICD-10-CM | POA: Diagnosis not present

## 2020-11-25 DIAGNOSIS — D509 Iron deficiency anemia, unspecified: Secondary | ICD-10-CM | POA: Diagnosis not present

## 2020-11-25 DIAGNOSIS — N186 End stage renal disease: Secondary | ICD-10-CM | POA: Diagnosis not present

## 2020-11-25 DIAGNOSIS — Z992 Dependence on renal dialysis: Secondary | ICD-10-CM | POA: Diagnosis not present

## 2020-11-25 DIAGNOSIS — D689 Coagulation defect, unspecified: Secondary | ICD-10-CM | POA: Diagnosis not present

## 2020-11-25 DIAGNOSIS — E1122 Type 2 diabetes mellitus with diabetic chronic kidney disease: Secondary | ICD-10-CM | POA: Diagnosis not present

## 2020-11-25 DIAGNOSIS — D631 Anemia in chronic kidney disease: Secondary | ICD-10-CM | POA: Diagnosis not present

## 2020-11-25 DIAGNOSIS — R519 Headache, unspecified: Secondary | ICD-10-CM | POA: Diagnosis not present

## 2020-11-25 DIAGNOSIS — N2581 Secondary hyperparathyroidism of renal origin: Secondary | ICD-10-CM | POA: Diagnosis not present

## 2020-11-26 ENCOUNTER — Ambulatory Visit: Payer: Medicare Other | Admitting: Gastroenterology

## 2020-11-27 DIAGNOSIS — N2581 Secondary hyperparathyroidism of renal origin: Secondary | ICD-10-CM | POA: Diagnosis not present

## 2020-11-27 DIAGNOSIS — D689 Coagulation defect, unspecified: Secondary | ICD-10-CM | POA: Diagnosis not present

## 2020-11-27 DIAGNOSIS — D631 Anemia in chronic kidney disease: Secondary | ICD-10-CM | POA: Diagnosis not present

## 2020-11-27 DIAGNOSIS — Z992 Dependence on renal dialysis: Secondary | ICD-10-CM | POA: Diagnosis not present

## 2020-11-27 DIAGNOSIS — N186 End stage renal disease: Secondary | ICD-10-CM | POA: Diagnosis not present

## 2020-11-27 DIAGNOSIS — E1122 Type 2 diabetes mellitus with diabetic chronic kidney disease: Secondary | ICD-10-CM | POA: Diagnosis not present

## 2020-11-27 DIAGNOSIS — D509 Iron deficiency anemia, unspecified: Secondary | ICD-10-CM | POA: Diagnosis not present

## 2020-11-27 DIAGNOSIS — R519 Headache, unspecified: Secondary | ICD-10-CM | POA: Diagnosis not present

## 2020-11-30 DIAGNOSIS — Z992 Dependence on renal dialysis: Secondary | ICD-10-CM | POA: Diagnosis not present

## 2020-11-30 DIAGNOSIS — D689 Coagulation defect, unspecified: Secondary | ICD-10-CM | POA: Diagnosis not present

## 2020-11-30 DIAGNOSIS — E1122 Type 2 diabetes mellitus with diabetic chronic kidney disease: Secondary | ICD-10-CM | POA: Diagnosis not present

## 2020-11-30 DIAGNOSIS — R519 Headache, unspecified: Secondary | ICD-10-CM | POA: Diagnosis not present

## 2020-11-30 DIAGNOSIS — D509 Iron deficiency anemia, unspecified: Secondary | ICD-10-CM | POA: Diagnosis not present

## 2020-11-30 DIAGNOSIS — D631 Anemia in chronic kidney disease: Secondary | ICD-10-CM | POA: Diagnosis not present

## 2020-11-30 DIAGNOSIS — N186 End stage renal disease: Secondary | ICD-10-CM | POA: Diagnosis not present

## 2020-11-30 DIAGNOSIS — N2581 Secondary hyperparathyroidism of renal origin: Secondary | ICD-10-CM | POA: Diagnosis not present

## 2020-12-01 DIAGNOSIS — Z1231 Encounter for screening mammogram for malignant neoplasm of breast: Secondary | ICD-10-CM | POA: Diagnosis not present

## 2020-12-02 DIAGNOSIS — N186 End stage renal disease: Secondary | ICD-10-CM | POA: Diagnosis not present

## 2020-12-02 DIAGNOSIS — D631 Anemia in chronic kidney disease: Secondary | ICD-10-CM | POA: Diagnosis not present

## 2020-12-02 DIAGNOSIS — D509 Iron deficiency anemia, unspecified: Secondary | ICD-10-CM | POA: Diagnosis not present

## 2020-12-02 DIAGNOSIS — R519 Headache, unspecified: Secondary | ICD-10-CM | POA: Diagnosis not present

## 2020-12-02 DIAGNOSIS — D689 Coagulation defect, unspecified: Secondary | ICD-10-CM | POA: Diagnosis not present

## 2020-12-02 DIAGNOSIS — N2581 Secondary hyperparathyroidism of renal origin: Secondary | ICD-10-CM | POA: Diagnosis not present

## 2020-12-02 DIAGNOSIS — Z992 Dependence on renal dialysis: Secondary | ICD-10-CM | POA: Diagnosis not present

## 2020-12-02 DIAGNOSIS — E1122 Type 2 diabetes mellitus with diabetic chronic kidney disease: Secondary | ICD-10-CM | POA: Diagnosis not present

## 2020-12-03 ENCOUNTER — Ambulatory Visit (INDEPENDENT_AMBULATORY_CARE_PROVIDER_SITE_OTHER): Payer: Medicare Other | Admitting: Nurse Practitioner

## 2020-12-03 ENCOUNTER — Encounter: Payer: Self-pay | Admitting: Nurse Practitioner

## 2020-12-03 ENCOUNTER — Other Ambulatory Visit: Payer: Self-pay

## 2020-12-03 VITALS — BP 140/90 | HR 79 | Temp 97.7°F | Ht 65.0 in | Wt 176.8 lb

## 2020-12-03 DIAGNOSIS — E785 Hyperlipidemia, unspecified: Secondary | ICD-10-CM | POA: Diagnosis not present

## 2020-12-03 DIAGNOSIS — E1122 Type 2 diabetes mellitus with diabetic chronic kidney disease: Secondary | ICD-10-CM | POA: Diagnosis not present

## 2020-12-03 DIAGNOSIS — F320 Major depressive disorder, single episode, mild: Secondary | ICD-10-CM

## 2020-12-03 DIAGNOSIS — Z992 Dependence on renal dialysis: Secondary | ICD-10-CM | POA: Diagnosis not present

## 2020-12-03 DIAGNOSIS — Z794 Long term (current) use of insulin: Secondary | ICD-10-CM

## 2020-12-03 DIAGNOSIS — K22719 Barrett's esophagus with dysplasia, unspecified: Secondary | ICD-10-CM

## 2020-12-03 DIAGNOSIS — N186 End stage renal disease: Secondary | ICD-10-CM

## 2020-12-03 DIAGNOSIS — M810 Age-related osteoporosis without current pathological fracture: Secondary | ICD-10-CM

## 2020-12-03 DIAGNOSIS — F17200 Nicotine dependence, unspecified, uncomplicated: Secondary | ICD-10-CM | POA: Diagnosis not present

## 2020-12-03 DIAGNOSIS — G4700 Insomnia, unspecified: Secondary | ICD-10-CM

## 2020-12-03 MED ORDER — DENOSUMAB 60 MG/ML ~~LOC~~ SOSY
60.0000 mg | PREFILLED_SYRINGE | Freq: Once | SUBCUTANEOUS | Status: AC
Start: 2020-12-03 — End: 2020-12-03
  Administered 2020-12-03: 60 mg via SUBCUTANEOUS

## 2020-12-03 MED ORDER — TRAZODONE HCL 100 MG PO TABS: 100.0000 mg | ORAL_TABLET | Freq: Every day | ORAL | 1 refills | Status: DC

## 2020-12-03 NOTE — Patient Instructions (Signed)
Try to increase physical acitivity- do this slowly, walk for 20-30 mins 5 days a week.

## 2020-12-03 NOTE — Progress Notes (Signed)
Careteam: Patient Care Team: [name], NP as PCP - General (Nurse Practitioner) [name], MD as Consulting Physician (Ophthalmology) [name], MD as Consulting Physician (Nephrology) [name], NP as Nurse Practitioner (Nurse Practitioner)  PLACE OF SERVICE:  [place] Directive information Does Patient Have a Medical Advance Directive?: Yes, Type of Advance Directive: Out of facility DNR (pink MOST or yellow form), Pre-existing out of facility DNR order (yellow form or pink MOST form): Yellow form placed in chart (order not valid for inpatient use), Does patient want to make changes to medical advance directive?: No - Patient declined  Allergies  Allergen Reactions   Lisinopril Other (See Comments)    Abnormal Kidney Function    Pioglitazone Other (See Comments)    REACTION: Desquamation of skin of the palm   Morphine Other (See Comments)   Other Nausea And Vomiting   Morphine And Related Nausea And Vomiting   Sulfonamide Derivatives Rash    Chief Complaint  Patient presents with   Medical Management of Chronic Issues    6 month follow up visit.   Health Maintenance    2nd COVID booster, HgA1c,Lipid panel,colonoscopy,Flu vaccine     HPI: Patient is a 75 y.o. female for routine follow up.   Had appt for endoscopy and colonoscopy but it was cancelled and rescheduled for later this month.   DM- diet controlled. No neuropathy.   Mood has been stable. No worsening anxiety or depression. Sleeping well on trazodone.   Followed with ophthalmology  Continues to smoke. Needs something when she get angry.   Smoking 3 cigarettes a day.   Review of Systems:  Review of Systems  Constitutional:  Negative for chills, fever and weight loss.  HENT:  Negative for tinnitus.   Respiratory:  Negative for cough, sputum production and shortness of breath.   Cardiovascular:  Negative for chest pain, palpitations and leg swelling.   Gastrointestinal:  Negative for abdominal pain, constipation, diarrhea and heartburn.  Genitourinary:  Negative for dysuria, frequency and urgency.  Musculoskeletal:  Negative for back pain, falls, joint pain and myalgias.  Skin: Negative.   Neurological:  Negative for dizziness and headaches.  Psychiatric/Behavioral:  Negative for depression and memory loss. The patient is nervous/anxious. The patient does not have insomnia.    Past Medical History:  Diagnosis Date   Anemia    Anxiety    Arthritis    lower back and knees    Asthma    childhood   Barrett's esophagus    Chronic kidney disease    Coagulation defect (HCC)    Depression    Diabetes mellitus    Dysphagia    Eczema    End stage renal disease (HCC)    Fluid overload, unspecified    History of herpes zoster 02/2010   Recovered fully after period of acute herpetic neuralgia tx w/ gabapentin.    Hx MRSA infection 2009   Hypercalcemia    Hyperlipidemia    Hypertension    Moderate protein-calorie malnutrition (HCC)    Neuromuscular disorder ([place])    chronic pain   Personal history of colonic polyps 10/17/2010   hyperplastic   Past Surgical History:  Procedure Laterality Date   ABDOMINAL HYSTERECTOMY     unclear when   AV FISTULA PLACEMENT Left 02/13/2017   Procedure: ARTERIOVENOUS (AV) GORE-TEX STRETCH GRAFT INSERTION INTO LEFT ARM;  Surgeon: Elam Dutch, MD;  Location: [facility];  Service: Vascular;  Laterality: Left;  CATARACT EXTRACTION Bilateral    thigh surg     left side due to MRSA   Social History:   reports that she has been smoking cigarettes. She has a 40.00 pack-year smoking history. She has never used smokeless tobacco. She reports current alcohol use of about 2.0 standard drinks per week. She reports that she does not use drugs.  Family History  Problem Relation Age of Onset   Hypertension Father    Kidney disease Father    Diabetes Brother    Colon cancer Neg Hx    CAD Neg Hx      Medications: Patient's Medications  New Prescriptions   No medications on file  Previous Medications   ASPIRIN 81 MG CHEWABLE TABLET    Chew 1 tablet (81 mg total) by mouth daily.   CALCIUM ACETATE (PHOSLO) 667 MG CAPSULE    Take by mouth 3 (three) times daily with meals.   CINACALCET (SENSIPAR) 30 MG TABLET    Take 30 mg by mouth daily.   CLOPIDOGREL (PLAVIX) 75 MG TABLET    TAKE 1 TABLET BY MOUTH EVERY DAY   DENOSUMAB (PROLIA) 60 MG/ML SOSY INJECTION    TO BE ADMINISTERED IN PHYSICIAN'S OFFICE. INJECT ONE SYRINGE SUBCOUSLY ONCE EVERY 6 MONTHS. REFRIGERATE. USE WITHIN 14 DAYS ONCE AT ROOM TEMPERATURE.   EZETIMIBE (ZETIA) 10 MG TABLET    TAKE 1 TABLET BY MOUTH EVERY DAY   PANTOPRAZOLE (PROTONIX) 40 MG TABLET    Take 1 tablet (40 mg total) by mouth daily.   PAROXETINE (PAXIL) 40 MG TABLET    TAKE 1 TABLET BY MOUTH EVERY DAY   PRAVASTATIN (PRAVACHOL) 40 MG TABLET    TAKE 1 TABLET BY MOUTH EVERYDAY AT BEDTIME   SEVELAMER CARBONATE (RENVELA) 2.4 G PACK    2.4 g 3 (three) times daily with meals.   TRAZODONE (DESYREL) 100 MG TABLET    TAKE 1 TABLET BY MOUTH AT BEDTIME   TRIAMCINOLONE CREAM (KENALOG) 0.1 %    APPLY 1 APPLICATION TOPICALLY 3 (THREE) TIMES DAILY AS NEEDED (RASH).  Modified Medications   No medications on file  Discontinued Medications   No medications on file    Physical Exam:  Vitals:   12/03/20 1015  BP: 140/90  Pulse: 79  Temp: 97.7 F (36.5 C)  TempSrc: Temporal  SpO2: 98%  Weight: 176 lb 12.8 oz (80.2 kg)  Height: 5\' 5"  (1.651 m)   Body mass index is 29.42 kg/m. Wt Readings from Last 3 Encounters:  12/03/20 176 lb 12.8 oz (80.2 kg)  06/18/20 170 lb (77.1 kg)  06/03/20 170 lb (77.1 kg)    Physical Exam Constitutional:      General: She is not in acute distress.    Appearance: She is well-developed. She is not diaphoretic.  HENT:     Head: Normocephalic and atraumatic.     Mouth/Throat:     Pharynx: No oropharyngeal exudate.  Eyes:      Conjunctiva/sclera: Conjunctivae normal.     Pupils: Pupils are equal, round, and reactive to light.  Cardiovascular:     Rate and Rhythm: Normal rate and regular rhythm.     Heart sounds: Normal heart sounds.  Pulmonary:     Effort: Pulmonary effort is normal.     Breath sounds: Normal breath sounds.  Abdominal:     General: Bowel sounds are normal.     Palpations: Abdomen is soft.  Musculoskeletal:     Cervical back: Normal range of motion and neck  supple.     Right lower leg: No edema.     Left lower leg: No edema.  Skin:    General: Skin is warm and dry.  Neurological:     Mental Status: She is alert.  Psychiatric:        Mood and Affect: Mood normal.    Labs reviewed: Basic Metabolic Panel: Recent Labs    02/23/20 0000 04/24/20 0000  K 4.0 4.9  CALCIUM 8.2* 9.7   Liver Function Tests: Recent Labs    02/23/20 0000 03/03/20 0941 04/24/20 0000  AST  --  17  --   ALT  --  13  --   BILITOT  --  0.4  --   PROT  --  6.9  --   ALBUMIN 4.0  --  4.0   No results for input(s): LIPASE, AMYLASE in the last 8760 hours. No results for input(s): AMMONIA in the last 8760 hours. CBC: Recent Labs    02/23/20 0000 04/24/20 0000  HGB 10.2* 10.9*   Lipid Panel: No results for input(s): CHOL, HDL, LDLCALC, TRIG, CHOLHDL, LDLDIRECT in the last 8760 hours. TSH: No results for input(s): TSH in the last 8760 hours. A1C: Lab Results  Component Value Date   HGBA1C 5.2 03/03/2020     Assessment/Plan 1. Insomnia, unspecified type -stable on trazodone. Continue at this time.  2. Senile osteoporosis - denosumab (PROLIA) injection 60 mg given today  4. ESRD (end stage renal disease) on dialysis [facility]) -continues on HD Tuesday, Thursday and sat -continues on phoslo and sensipar   5. Controlled type 2 diabetes mellitus with chronic kidney disease on chronic dialysis, with long-term current use of insulin ([place]) -continue dietary compliance, routine foot care/monitoring and  to keep up with diabetic eye exams through ophthalmology.  - Hemoglobin A1c  6. Mild single current episode of major depressive disorder ([place]) -ongoing but stable. Encouraged to increase physical activity which can help with depression and anxiety. Continues on   7. Barrett's esophagus with dysplasia - followed by GI, she has endoscopy and colonoscopy scheduled.  -continues on protonix  8. Smoker -cessation encouraged. Discussed ways for smoking cessation   9. Hyperlipidemia with target LDL less than 70 --continues on zetia. Lipids due. Continue dietary modifications with medication management.  - Lipid Panel   Next appt: 6 months.  Carlos American. [place], [facility] [phone]

## 2020-12-04 DIAGNOSIS — E1122 Type 2 diabetes mellitus with diabetic chronic kidney disease: Secondary | ICD-10-CM | POA: Diagnosis not present

## 2020-12-04 DIAGNOSIS — N186 End stage renal disease: Secondary | ICD-10-CM | POA: Diagnosis not present

## 2020-12-04 DIAGNOSIS — N2581 Secondary hyperparathyroidism of renal origin: Secondary | ICD-10-CM | POA: Diagnosis not present

## 2020-12-04 DIAGNOSIS — D689 Coagulation defect, unspecified: Secondary | ICD-10-CM | POA: Diagnosis not present

## 2020-12-04 DIAGNOSIS — D631 Anemia in chronic kidney disease: Secondary | ICD-10-CM | POA: Diagnosis not present

## 2020-12-04 DIAGNOSIS — D509 Iron deficiency anemia, unspecified: Secondary | ICD-10-CM | POA: Diagnosis not present

## 2020-12-04 DIAGNOSIS — R519 Headache, unspecified: Secondary | ICD-10-CM | POA: Diagnosis not present

## 2020-12-04 DIAGNOSIS — Z992 Dependence on renal dialysis: Secondary | ICD-10-CM | POA: Diagnosis not present

## 2020-12-06 ENCOUNTER — Other Ambulatory Visit: Payer: Medicare Other

## 2020-12-06 DIAGNOSIS — E785 Hyperlipidemia, unspecified: Secondary | ICD-10-CM | POA: Diagnosis not present

## 2020-12-06 DIAGNOSIS — N186 End stage renal disease: Secondary | ICD-10-CM | POA: Diagnosis not present

## 2020-12-06 DIAGNOSIS — Z794 Long term (current) use of insulin: Secondary | ICD-10-CM | POA: Diagnosis not present

## 2020-12-06 DIAGNOSIS — E1122 Type 2 diabetes mellitus with diabetic chronic kidney disease: Secondary | ICD-10-CM | POA: Diagnosis not present

## 2020-12-06 DIAGNOSIS — Z992 Dependence on renal dialysis: Secondary | ICD-10-CM | POA: Diagnosis not present

## 2020-12-07 DIAGNOSIS — R519 Headache, unspecified: Secondary | ICD-10-CM | POA: Diagnosis not present

## 2020-12-07 DIAGNOSIS — N186 End stage renal disease: Secondary | ICD-10-CM | POA: Diagnosis not present

## 2020-12-07 DIAGNOSIS — D631 Anemia in chronic kidney disease: Secondary | ICD-10-CM | POA: Diagnosis not present

## 2020-12-07 DIAGNOSIS — Z992 Dependence on renal dialysis: Secondary | ICD-10-CM | POA: Diagnosis not present

## 2020-12-07 DIAGNOSIS — D689 Coagulation defect, unspecified: Secondary | ICD-10-CM | POA: Diagnosis not present

## 2020-12-07 DIAGNOSIS — E1122 Type 2 diabetes mellitus with diabetic chronic kidney disease: Secondary | ICD-10-CM | POA: Diagnosis not present

## 2020-12-07 DIAGNOSIS — D509 Iron deficiency anemia, unspecified: Secondary | ICD-10-CM | POA: Diagnosis not present

## 2020-12-07 DIAGNOSIS — N2581 Secondary hyperparathyroidism of renal origin: Secondary | ICD-10-CM | POA: Diagnosis not present

## 2020-12-07 LAB — HEMOGLOBIN A1C
Hgb A1c MFr Bld: 4.7 % of total Hgb (ref <5.7)
Mean Plasma Glucose: 88 mg/dL
eAG (mmol/L): 4.9 mmol/L

## 2020-12-07 LAB — LIPID PANEL
Cholesterol: 120 mg/dL (ref <200)
HDL: 45 mg/dL — ABNORMAL LOW (ref >=50)
LDL Cholesterol (Calc): 55 mg/dL (calc)
Non-HDL Cholesterol (Calc): 75 mg/dL (calc) (ref <130)
Total CHOL/HDL Ratio: 2.7 (calc) (ref <5.0)
Triglycerides: 113 mg/dL (ref <150)

## 2020-12-09 DIAGNOSIS — D689 Coagulation defect, unspecified: Secondary | ICD-10-CM | POA: Diagnosis not present

## 2020-12-09 DIAGNOSIS — R519 Headache, unspecified: Secondary | ICD-10-CM | POA: Diagnosis not present

## 2020-12-09 DIAGNOSIS — D631 Anemia in chronic kidney disease: Secondary | ICD-10-CM | POA: Diagnosis not present

## 2020-12-09 DIAGNOSIS — D509 Iron deficiency anemia, unspecified: Secondary | ICD-10-CM | POA: Diagnosis not present

## 2020-12-09 DIAGNOSIS — Z992 Dependence on renal dialysis: Secondary | ICD-10-CM | POA: Diagnosis not present

## 2020-12-09 DIAGNOSIS — E1122 Type 2 diabetes mellitus with diabetic chronic kidney disease: Secondary | ICD-10-CM | POA: Diagnosis not present

## 2020-12-09 DIAGNOSIS — N2581 Secondary hyperparathyroidism of renal origin: Secondary | ICD-10-CM | POA: Diagnosis not present

## 2020-12-09 DIAGNOSIS — N186 End stage renal disease: Secondary | ICD-10-CM | POA: Diagnosis not present

## 2020-12-11 DIAGNOSIS — D689 Coagulation defect, unspecified: Secondary | ICD-10-CM | POA: Diagnosis not present

## 2020-12-11 DIAGNOSIS — N186 End stage renal disease: Secondary | ICD-10-CM | POA: Diagnosis not present

## 2020-12-11 DIAGNOSIS — D631 Anemia in chronic kidney disease: Secondary | ICD-10-CM | POA: Diagnosis not present

## 2020-12-11 DIAGNOSIS — D509 Iron deficiency anemia, unspecified: Secondary | ICD-10-CM | POA: Diagnosis not present

## 2020-12-11 DIAGNOSIS — E1122 Type 2 diabetes mellitus with diabetic chronic kidney disease: Secondary | ICD-10-CM | POA: Diagnosis not present

## 2020-12-11 DIAGNOSIS — N2581 Secondary hyperparathyroidism of renal origin: Secondary | ICD-10-CM | POA: Diagnosis not present

## 2020-12-11 DIAGNOSIS — Z992 Dependence on renal dialysis: Secondary | ICD-10-CM | POA: Diagnosis not present

## 2020-12-11 DIAGNOSIS — R519 Headache, unspecified: Secondary | ICD-10-CM | POA: Diagnosis not present

## 2020-12-13 DIAGNOSIS — H34832 Tributary (branch) retinal vein occlusion, left eye, with macular edema: Secondary | ICD-10-CM | POA: Diagnosis not present

## 2020-12-14 DIAGNOSIS — Z992 Dependence on renal dialysis: Secondary | ICD-10-CM | POA: Diagnosis not present

## 2020-12-14 DIAGNOSIS — N186 End stage renal disease: Secondary | ICD-10-CM | POA: Diagnosis not present

## 2020-12-14 DIAGNOSIS — D631 Anemia in chronic kidney disease: Secondary | ICD-10-CM | POA: Diagnosis not present

## 2020-12-14 DIAGNOSIS — D689 Coagulation defect, unspecified: Secondary | ICD-10-CM | POA: Diagnosis not present

## 2020-12-14 DIAGNOSIS — R519 Headache, unspecified: Secondary | ICD-10-CM | POA: Diagnosis not present

## 2020-12-14 DIAGNOSIS — N2581 Secondary hyperparathyroidism of renal origin: Secondary | ICD-10-CM | POA: Diagnosis not present

## 2020-12-14 DIAGNOSIS — E1122 Type 2 diabetes mellitus with diabetic chronic kidney disease: Secondary | ICD-10-CM | POA: Diagnosis not present

## 2020-12-14 DIAGNOSIS — D509 Iron deficiency anemia, unspecified: Secondary | ICD-10-CM | POA: Diagnosis not present

## 2020-12-16 ENCOUNTER — Ambulatory Visit: Payer: Medicare Other | Admitting: Physician Assistant

## 2020-12-16 DIAGNOSIS — Z992 Dependence on renal dialysis: Secondary | ICD-10-CM | POA: Diagnosis not present

## 2020-12-16 DIAGNOSIS — E1122 Type 2 diabetes mellitus with diabetic chronic kidney disease: Secondary | ICD-10-CM | POA: Diagnosis not present

## 2020-12-16 DIAGNOSIS — N186 End stage renal disease: Secondary | ICD-10-CM | POA: Diagnosis not present

## 2020-12-16 DIAGNOSIS — D689 Coagulation defect, unspecified: Secondary | ICD-10-CM | POA: Diagnosis not present

## 2020-12-16 DIAGNOSIS — D509 Iron deficiency anemia, unspecified: Secondary | ICD-10-CM | POA: Diagnosis not present

## 2020-12-16 DIAGNOSIS — D631 Anemia in chronic kidney disease: Secondary | ICD-10-CM | POA: Diagnosis not present

## 2020-12-16 DIAGNOSIS — N2581 Secondary hyperparathyroidism of renal origin: Secondary | ICD-10-CM | POA: Diagnosis not present

## 2020-12-16 DIAGNOSIS — R519 Headache, unspecified: Secondary | ICD-10-CM | POA: Diagnosis not present

## 2020-12-18 DIAGNOSIS — E1122 Type 2 diabetes mellitus with diabetic chronic kidney disease: Secondary | ICD-10-CM | POA: Diagnosis not present

## 2020-12-18 DIAGNOSIS — D689 Coagulation defect, unspecified: Secondary | ICD-10-CM | POA: Diagnosis not present

## 2020-12-18 DIAGNOSIS — D631 Anemia in chronic kidney disease: Secondary | ICD-10-CM | POA: Diagnosis not present

## 2020-12-18 DIAGNOSIS — Z992 Dependence on renal dialysis: Secondary | ICD-10-CM | POA: Diagnosis not present

## 2020-12-18 DIAGNOSIS — D509 Iron deficiency anemia, unspecified: Secondary | ICD-10-CM | POA: Diagnosis not present

## 2020-12-18 DIAGNOSIS — N186 End stage renal disease: Secondary | ICD-10-CM | POA: Diagnosis not present

## 2020-12-18 DIAGNOSIS — N2581 Secondary hyperparathyroidism of renal origin: Secondary | ICD-10-CM | POA: Diagnosis not present

## 2020-12-18 DIAGNOSIS — R519 Headache, unspecified: Secondary | ICD-10-CM | POA: Diagnosis not present

## 2020-12-21 DIAGNOSIS — R519 Headache, unspecified: Secondary | ICD-10-CM | POA: Diagnosis not present

## 2020-12-21 DIAGNOSIS — N186 End stage renal disease: Secondary | ICD-10-CM | POA: Diagnosis not present

## 2020-12-21 DIAGNOSIS — E1122 Type 2 diabetes mellitus with diabetic chronic kidney disease: Secondary | ICD-10-CM | POA: Diagnosis not present

## 2020-12-21 DIAGNOSIS — D509 Iron deficiency anemia, unspecified: Secondary | ICD-10-CM | POA: Diagnosis not present

## 2020-12-21 DIAGNOSIS — D631 Anemia in chronic kidney disease: Secondary | ICD-10-CM | POA: Diagnosis not present

## 2020-12-21 DIAGNOSIS — N2581 Secondary hyperparathyroidism of renal origin: Secondary | ICD-10-CM | POA: Diagnosis not present

## 2020-12-21 DIAGNOSIS — D689 Coagulation defect, unspecified: Secondary | ICD-10-CM | POA: Diagnosis not present

## 2020-12-21 DIAGNOSIS — Z992 Dependence on renal dialysis: Secondary | ICD-10-CM | POA: Diagnosis not present

## 2020-12-22 DIAGNOSIS — N186 End stage renal disease: Secondary | ICD-10-CM | POA: Diagnosis not present

## 2020-12-22 DIAGNOSIS — E1129 Type 2 diabetes mellitus with other diabetic kidney complication: Secondary | ICD-10-CM | POA: Diagnosis not present

## 2020-12-22 DIAGNOSIS — Z992 Dependence on renal dialysis: Secondary | ICD-10-CM | POA: Diagnosis not present

## 2021-01-07 ENCOUNTER — Encounter (HOSPITAL_COMMUNITY): Payer: Self-pay | Admitting: *Deleted

## 2021-01-14 ENCOUNTER — Ambulatory Visit (INDEPENDENT_AMBULATORY_CARE_PROVIDER_SITE_OTHER): Payer: 59 | Admitting: Physician Assistant

## 2021-01-14 ENCOUNTER — Encounter (HOSPITAL_COMMUNITY): Payer: Self-pay | Admitting: *Deleted

## 2021-01-14 ENCOUNTER — Encounter: Payer: Self-pay | Admitting: Physician Assistant

## 2021-01-14 VITALS — BP 140/70 | HR 91 | Ht 65.0 in | Wt 176.1 lb

## 2021-01-14 DIAGNOSIS — N186 End stage renal disease: Secondary | ICD-10-CM

## 2021-01-14 DIAGNOSIS — Z992 Dependence on renal dialysis: Secondary | ICD-10-CM | POA: Diagnosis not present

## 2021-01-14 DIAGNOSIS — Z7901 Long term (current) use of anticoagulants: Secondary | ICD-10-CM | POA: Diagnosis not present

## 2021-01-14 DIAGNOSIS — Z1211 Encounter for screening for malignant neoplasm of colon: Secondary | ICD-10-CM

## 2021-01-14 MED ORDER — NA SULFATE-K SULFATE-MG SULF 17.5-3.13-1.6 GM/177ML PO SOLN: 1.0000 | Freq: Once | ORAL | 0 refills | Status: AC

## 2021-01-14 NOTE — Patient Instructions (Signed)
You will be contacted by our office prior to your procedure for directions on holding your Plavix.  If you do not hear from our office 1 week prior to your scheduled procedure, please call [phone] to discuss.   You have been scheduled for a colonoscopy. Please follow written instructions given to you at your visit today.  Please pick up your prep supplies at the pharmacy within the next 1-3 days. If you use inhalers (even only as needed), please bring them with you on the day of your procedure.  If you are age 88 or older, your body mass index should be between 23-30. Your Body mass index is 29.31 kg/m. If this is out of the aforementioned range listed, please consider follow up with your Primary Care Provider.  If you are age 54 or younger, your body mass index should be between 19-25. Your Body mass index is 29.31 kg/m. If this is out of the aformentioned range listed, please consider follow up with your Primary Care Provider.   __________________________________________________________  The [facility] providers would like to encourage you to use [facility] to communicate with providers for non-urgent requests or questions.  Due to long hold times on the telephone, sending your provider a message by [facility] may be a faster and more efficient way to get a response.  Please allow 48 business hours for a response.  Please remember that this is for non-urgent requests.

## 2021-01-14 NOTE — Progress Notes (Signed)
I agree with the above note, plan 

## 2021-01-14 NOTE — Progress Notes (Signed)
Chief Complaint: Screening for colorectal cancer on chronic anticoagulation  HPI:    [name] is a 75 year old African-American female, known to Dr. Ardis Hughs, with a past medical history of Barrett's esophagus, stroke on Plavix (12/03/2016 echo with LVEF 60-65%), end-stage renal disease on hemodialysis Tuesday Thursday and Saturday, diabetes and others listed below, who presents to clinic today for discussion of a screening colonoscopy.    10/17/2010 colonoscopy with mild diverticulosis in the sigmoid to descending colon, multiple polyps in the rectum, internal hemorrhoids and otherwise normal.  Pathology showed hyperplastic polyps and repeat recommended in 10 years.    06/18/2020 patient had EGD with Dr. Ardis Hughs for heartburn and surveillance for malignancy due to personal history of Barrett's esophagus with last EGD in 2015.  At that time had long segment, nonnodular Barrett's change, biopsied extensively, large hiatal hernia with obvious Lysbeth Galas type erosions, exam otherwise normal.  Biopsies continue to show Barrett's but with no dysplasia.  Repeat EGD recommended in 4 years.    Today, the patient tells me she is doing well.  She has had no acute health changes since February of this year when she had her EGD.  Aware that she needs a colonoscopy and would like to schedule that now.    Denies fever, chills, weight loss, change in bowel habits or abdominal pain.  Past Medical History:  Diagnosis Date   Anemia    Anxiety    Arthritis    lower back and knees    Asthma    childhood   Barrett's esophagus    Chronic kidney disease    Coagulation defect (HCC)    Depression    Diabetes mellitus    Dysphagia    Eczema    End stage renal disease (HCC)    Fluid overload, unspecified    History of herpes zoster 02/2010   Recovered fully after period of acute herpetic neuralgia tx w/ gabapentin.    Hx MRSA infection 2009   Hypercalcemia    Hyperlipidemia    Hypertension    Moderate  protein-calorie malnutrition (HCC)    Neuromuscular disorder ([place])    chronic pain   Personal history of colonic polyps 10/17/2010   hyperplastic    Past Surgical History:  Procedure Laterality Date   ABDOMINAL HYSTERECTOMY     unclear when   AV FISTULA PLACEMENT Left 02/13/2017   Procedure: ARTERIOVENOUS (AV) GORE-TEX STRETCH GRAFT INSERTION INTO LEFT ARM;  Surgeon: Elam Dutch, MD;  [facility];  Service: Vascular;  Laterality: Left;   CATARACT EXTRACTION Bilateral    thigh surg     left side due to MRSA    Current Outpatient Medications  Medication Sig Dispense Refill   aspirin 81 MG chewable tablet Chew 1 tablet (81 mg total) by mouth daily. 30 tablet 0   calcium acetate (PHOSLO) 667 MG capsule Take by mouth 3 (three) times daily with meals.     cinacalcet (SENSIPAR) 30 MG tablet Take 30 mg by mouth daily.  12   clopidogrel (PLAVIX) 75 MG tablet TAKE 1 TABLET BY MOUTH EVERY DAY 90 tablet 1   denosumab (PROLIA) 60 MG/ML SOSY injection TO BE ADMINISTERED IN PHYSICIAN'S OFFICE. INJECT ONE SYRINGE SUBCOUSLY ONCE EVERY 6 MONTHS. REFRIGERATE. USE WITHIN 14 DAYS ONCE AT ROOM TEMPERATURE. 60 mL 0   ezetimibe (ZETIA) 10 MG tablet TAKE 1 TABLET BY MOUTH EVERY DAY 90 tablet 1   pantoprazole (PROTONIX) 40 MG tablet Take 1 tablet (40 mg total) by mouth daily.  30 tablet 11   PARoxetine (PAXIL) 40 MG tablet TAKE 1 TABLET BY MOUTH EVERY DAY 90 tablet 1   pravastatin (PRAVACHOL) 40 MG tablet TAKE 1 TABLET BY MOUTH EVERYDAY AT BEDTIME 90 tablet 1   sevelamer carbonate (RENVELA) 2.4 g PACK 2.4 g 3 (three) times daily with meals.     traZODone (DESYREL) 100 MG tablet Take 1 tablet (100 mg total) by mouth at bedtime. 90 tablet 1   triamcinolone cream (KENALOG) 0.1 % APPLY 1 APPLICATION TOPICALLY 3 (THREE) TIMES DAILY AS NEEDED (RASH). 60 g 1   No current facility-administered medications for this visit.    Allergies as of 01/14/2021 - Review Complete 12/03/2020  Allergen Reaction Noted    Lisinopril Other (See Comments) 01/07/2016   Pioglitazone Other (See Comments) 03/03/2008   Morphine Other (See Comments) 08/22/2019   Other Nausea And Vomiting 08/22/2019   Morphine and related Nausea And Vomiting 11/04/2013   Sulfonamide derivatives Rash     Family History  Problem Relation Age of Onset   Hypertension Father    Kidney disease Father    Diabetes Brother    Colon cancer Neg Hx    CAD Neg Hx     Social History   Socioeconomic History   Marital status: Divorced    Spouse name: Not on file   Number of children: 0   Years of education: Not on file   Highest education level: Not on file  Occupational History   Occupation: Retired    Fish farm manager: SELF-EMPLOYED  Tobacco Use   Smoking status: Every Day    Packs/day: 1.00    Years: 40.00    Pack years: 40.00    Types: Cigarettes   Smokeless tobacco: Never   Tobacco comments:    smokes 1 pack of cigerettes a week- off and on  Vaping Use   Vaping Use: Never used  Substance and Sexual Activity   Alcohol use: Yes    Alcohol/week: 2.0 standard drinks    Types: 2 Standard drinks or equivalent per week    Comment: wine and beer- occasionally    Drug use: No   Sexual activity: Never  Other Topics Concern   Not on file  Social History Narrative   Financial assistance approved for 100% discount at [facility] and has [facility] card per [employer]   02/10/2010      3 caffeine drinks daily    Social Determinants of Health   Financial Resource Strain: Not on file  Food Insecurity: Not on file  Transportation Needs: Not on file  Physical Activity: Not on file  Stress: Not on file  Social Connections: Not on file  Intimate Partner Violence: Not on file    Review of Systems:    Constitutional: No weight loss, fever or chills Cardiovascular: No chest pain  Respiratory: No SOB  Gastrointestinal: See HPI and otherwise negative   Physical Exam:  Vital signs: BP 140/70 (BP Location: Right Arm, Patient Position:  Sitting, Cuff Size: Normal)   Pulse 91   Ht 5\' 5"  (1.651 m)   Wt 176 lb 2 oz (79.9 kg)   BMI 29.31 kg/m    Constitutional:   Pleasant overweight AA female appears to be in NAD, Well developed, Well nourished, alert and cooperative Respiratory: Respirations even and unlabored. Lungs clear to auscultation bilaterally.   No wheezes, crackles, or rhonchi.  Cardiovascular: Normal S1, S2. No MRG. Regular rate and rhythm. No peripheral edema, cyanosis or pallor.  Gastrointestinal:  Soft, nondistended, nontender.  No rebound or guarding. Normal bowel sounds. No appreciable masses or hepatomegaly. Rectal:  Not performed.  Psychiatric: Demonstrates good judgement and reason without abnormal affect or behaviors.  RELEVANT LABS AND IMAGING: CBC    Component Value Date/Time   WBC 7.8 07/30/2018 1311   RBC 4.20 07/30/2018 1311   HGB 10.9 (A) 04/24/2020 0000   HGB 11.2 08/27/2015 0937   HCT 39.7 07/30/2018 1311   HCT 33.7 (L) 08/27/2015 0937   PLT 230 07/30/2018 1311   PLT 278 08/27/2015 0937   MCV 94.5 07/30/2018 1311   MCV 86 08/27/2015 0937   MCH 30.7 07/30/2018 1311   MCHC 32.5 07/30/2018 1311   RDW 15.9 (H) 07/30/2018 1311   RDW 15.4 08/27/2015 0937   LYMPHSABS 1.4 05/09/2018 1114   LYMPHSABS 2.1 08/27/2015 0937   MONOABS 0.4 05/09/2018 1114   EOSABS 0.2 05/09/2018 1114   EOSABS 0.4 08/27/2015 0937   BASOSABS 0.1 05/09/2018 1114   BASOSABS 0.1 08/27/2015 0937    CMP     Component Value Date/Time   NA 138 07/30/2018 1311   NA 142 09/20/2017 0000   K 4.9 04/24/2020 0000   CL 93 (L) 07/30/2018 1311   CO2 30 07/30/2018 1311   GLUCOSE 126 (H) 07/30/2018 1311   BUN 18 07/30/2018 1311   BUN 20 09/08/2015 0859   CREATININE 6.50 (H) 07/30/2018 1311   CREATININE 6.33 (H) 09/28/2016 0903   CALCIUM 9.7 04/24/2020 0000   CALCIUM 7.9 (L) 03/09/2017 0857   PROT 6.9 03/03/2020 0941   PROT 6.1 08/27/2015 0937   ALBUMIN 4.0 04/24/2020 0000   ALBUMIN 3.5 (L) 08/27/2015 0937   AST 17  03/03/2020 0941   ALT 13 03/03/2020 0941   ALKPHOS 87 03/11/2017 0327   BILITOT 0.4 03/03/2020 0941   BILITOT 0.3 08/27/2015 0937   GFRNONAA 6 (L) 07/30/2018 1311   GFRNONAA 6 (L) 09/28/2016 0903   GFRAA 7 (L) 07/30/2018 1311   GFRAA 7 (L) 09/28/2016 0903    Assessment: 1.  Screening for colorectal cancer: Last colonoscopy 10 years ago with recommendations for repeat in 10 years after finding of diverticulosis and hyperplastic polyps 2.  History of stroke: On Plavix 3.  ESRD on HD: Tuesday Thursday and Saturday  Plan: 1.  Scheduled patient for a colonoscopy in the [place] with Dr. Ardis Hughs.  Did provide the patient with a detailed list of risks for the procedure and she agrees to proceed. Patient is appropriate for endoscopic procedure(s) in the ambulatory ([place]) setting.  2.  Patient will hold her Plavix for 5 days prior to time of procedure.  We will communicate with her prescribing physician to ensure this is acceptable for her. 3.  Patient to follow in clinic per recommendations from Dr. Ardis Hughs after time of procedure.  Ellouise Newer, PA-C [facility] 01/14/2021, 8:57 AM  Cc: Deborah Chandler, NP

## 2021-01-25 ENCOUNTER — Other Ambulatory Visit: Payer: Self-pay | Admitting: Nurse Practitioner

## 2021-01-25 DIAGNOSIS — I639 Cerebral infarction, unspecified: Secondary | ICD-10-CM

## 2021-02-03 ENCOUNTER — Encounter (HOSPITAL_COMMUNITY): Payer: Self-pay | Admitting: *Deleted

## 2021-02-03 ENCOUNTER — Other Ambulatory Visit: Payer: Self-pay | Admitting: Nurse Practitioner

## 2021-02-03 DIAGNOSIS — Z8673 Personal history of transient ischemic attack (TIA), and cerebral infarction without residual deficits: Secondary | ICD-10-CM

## 2021-02-07 ENCOUNTER — Telehealth: Payer: Self-pay | Admitting: *Deleted

## 2021-02-07 ENCOUNTER — Telehealth: Payer: Self-pay | Admitting: Physician Assistant

## 2021-02-07 NOTE — Telephone Encounter (Signed)
Patient called and wanted to know if she needed to drink both bottles of the prep. I informed her yes she did need to drink both bottles.

## 2021-02-07 NOTE — Telephone Encounter (Signed)
Patient informed to hold Plavix starting Wednesday. Patient voiced understanding.

## 2021-02-07 NOTE — Telephone Encounter (Signed)
   [name] [birth year] [id number]  Dear Janett Billow, NP:  We have scheduled the above named patient for a(n) colonoscopy procedure. Our records show that (s)he is on anticoagulation therapy.  Please advise as to whether the patient may come off their therapy of Plavix 5 days prior to their procedure which is scheduled for 02/14/21.  Please route your response to Caryl Asp, [place] or fax response to [phone].  Sincerely,   Caryl Asp, [facility]

## 2021-02-07 NOTE — Telephone Encounter (Signed)
Patient has questions about the prep for her colonoscopy.  Please call.  Thank you.

## 2021-02-07 NOTE — Telephone Encounter (Signed)
Yes okay to hold plavix for 5 days prior to procedure

## 2021-02-14 ENCOUNTER — Ambulatory Visit (AMBULATORY_SURGERY_CENTER): Payer: 59 | Admitting: Gastroenterology

## 2021-02-14 ENCOUNTER — Encounter: Payer: Self-pay | Admitting: Gastroenterology

## 2021-02-14 VITALS — BP 171/77 | HR 72 | Temp 96.2°F | Resp 20 | Ht 65.0 in | Wt 176.0 lb

## 2021-02-14 DIAGNOSIS — Z1211 Encounter for screening for malignant neoplasm of colon: Secondary | ICD-10-CM

## 2021-02-14 DIAGNOSIS — D122 Benign neoplasm of ascending colon: Secondary | ICD-10-CM

## 2021-02-14 DIAGNOSIS — D123 Benign neoplasm of transverse colon: Secondary | ICD-10-CM | POA: Diagnosis not present

## 2021-02-14 MED ORDER — SODIUM CHLORIDE 0.9 % IV SOLN: 500.0000 mL | Freq: Once | INTRAVENOUS | Status: DC

## 2021-02-14 NOTE — Progress Notes (Signed)
HPI: This is a woman at routine risk for colon cancer   ROS: complete GI ROS as described in HPI, all other review negative.  Constitutional:  No unintentional weight loss   Past Medical History:  Diagnosis Date   Anemia    Anxiety    Arthritis    lower back and knees    Asthma    childhood   Barrett's esophagus    Chronic kidney disease    Coagulation defect (HCC)    Depression    Diabetes mellitus    Dysphagia    Eczema    End stage renal disease (HCC)    Fluid overload, unspecified    History of herpes zoster 02/2010   Recovered fully after period of acute herpetic neuralgia tx w/ gabapentin.    Hx MRSA infection 2009   Hypercalcemia    Hyperlipidemia    Hypertension    Moderate protein-calorie malnutrition (HCC)    Neuromuscular disorder ([place])    chronic pain   Personal history of colonic polyps 10/17/2010   hyperplastic    Past Surgical History:  Procedure Laterality Date   ABDOMINAL HYSTERECTOMY     unclear when   AV FISTULA PLACEMENT Left 02/13/2017   Procedure: ARTERIOVENOUS (AV) GORE-TEX STRETCH GRAFT INSERTION INTO LEFT ARM;  Surgeon: Elam Dutch, MD;  Location: [facility] OR;  Service: Vascular;  Laterality: Left;   CATARACT EXTRACTION Bilateral    COLONOSCOPY     thigh surg     left side due to MRSA   UPPER GASTROINTESTINAL ENDOSCOPY      Current Outpatient Medications  Medication Sig Dispense Refill   aspirin 81 MG chewable tablet Chew 1 tablet (81 mg total) by mouth daily. 30 tablet 0   calcium acetate (PHOSLO) 667 MG capsule Take by mouth 3 (three) times daily with meals.     cinacalcet (SENSIPAR) 30 MG tablet Take 30 mg by mouth daily.  12   clopidogrel (PLAVIX) 75 MG tablet TAKE 1 TABLET BY MOUTH EVERY DAY 90 tablet 1   ezetimibe (ZETIA) 10 MG tablet TAKE 1 TABLET BY MOUTH EVERY DAY 90 tablet 1   pantoprazole (PROTONIX) 40 MG tablet Take 1 tablet (40 mg total) by mouth daily. 30 tablet 11   PARoxetine (PAXIL) 40 MG tablet TAKE 1 TABLET BY  MOUTH EVERY DAY 90 tablet 1   pravastatin (PRAVACHOL) 40 MG tablet TAKE 1 TABLET BY MOUTH EVERYDAY AT BEDTIME 90 tablet 1   traZODone (DESYREL) 100 MG tablet Take 1 tablet (100 mg total) by mouth at bedtime. 90 tablet 1   denosumab (PROLIA) 60 MG/ML SOSY injection TO BE ADMINISTERED IN PHYSICIAN'S OFFICE. INJECT ONE SYRINGE SUBCOUSLY ONCE EVERY 6 MONTHS. REFRIGERATE. USE WITHIN 14 DAYS ONCE AT ROOM TEMPERATURE. 60 mL 0   sevelamer carbonate (RENVELA) 2.4 g PACK 2.4 g 3 (three) times daily with meals.     triamcinolone cream (KENALOG) 0.1 % APPLY 1 APPLICATION TOPICALLY 3 (THREE) TIMES DAILY AS NEEDED (RASH). 60 g 1   Current Facility-Administered Medications  Medication Dose Route Frequency Provider Last Rate Last Admin   0.9 %  sodium chloride infusion  500 mL Intravenous Once Milus Banister, MD        Allergies as of 02/14/2021 - Review Complete 02/14/2021  Allergen Reaction Noted   Lisinopril Other (See Comments) 01/07/2016   Pioglitazone Other (See Comments) 03/03/2008   Morphine Other (See Comments) 08/22/2019   Other Nausea And Vomiting 08/22/2019   Morphine and related Nausea And Vomiting  11/04/2013   Sulfonamide derivatives Rash     Family History  Problem Relation Age of Onset   Hypertension Father    Kidney disease Father    Diabetes Brother    Colon cancer Neg Hx    CAD Neg Hx    Rectal cancer Neg Hx    Stomach cancer Neg Hx     Social History   Socioeconomic History   Marital status: Divorced    Spouse name: Not on file   Number of children: 0   Years of education: Not on file   Highest education level: Not on file  Occupational History   Occupation: Retired    Fish farm manager: SELF-EMPLOYED  Tobacco Use   Smoking status: Every Day    Packs/day: 0.50    Years: 40.00    Pack years: 20.00    Types: Cigarettes   Smokeless tobacco: Never   Tobacco comments:    smokes 1 pack of cigerettes a week- off and on  Vaping Use   Vaping Use: Never used  Substance and  Sexual Activity   Alcohol use: Yes    Alcohol/week: 2.0 standard drinks    Types: 2 Standard drinks or equivalent per week    Comment: wine and beer- occasionally    Drug use: No   Sexual activity: Never  Other Topics Concern   Not on file  Social History Narrative   Financial assistance approved for 100% discount at [facility] and has [facility] card per [employer]   02/10/2010      3 caffeine drinks daily    Social Determinants of Health   Financial Resource Strain: Not on file  Food Insecurity: Not on file  Transportation Needs: Not on file  Physical Activity: Not on file  Stress: Not on file  Social Connections: Not on file  Intimate Partner Violence: Not on file     Physical Exam: BP (!) 143/76   Pulse 82   Temp (!) 96.2 F (35.7 C) (Temporal)   Ht 5\' 5"  (1.651 m)   Wt 176 lb (79.8 kg)   SpO2 95%   BMI 29.29 kg/m  Constitutional: generally well-appearing Psychiatric: alert and oriented x3 Lungs: CTA bilaterally Heart: no MCR  Assessment and plan: 75 y.o. female with routine risk for colon cancer  Colonsocopy today  Care is appropriate for the ambulatory setting.  Owens Loffler, MD [facility] 02/14/2021, 1:14 PM

## 2021-02-14 NOTE — Patient Instructions (Signed)
Resume previous diet and continue current medications. Ok to restart Plavix today. Awaiting pathology results.   YOU HAD AN ENDOSCOPIC PROCEDURE TODAY AT [facility]:   Refer to the procedure report that was given to you for any specific questions about what was found during the examination.  If the procedure report does not answer your questions, please call your gastroenterologist to clarify.  If you requested that your care partner not be given the details of your procedure findings, then the procedure report has been included in a sealed envelope for you to review at your convenience later.  YOU SHOULD EXPECT: Some feelings of bloating in the abdomen. Passage of more gas than usual.  Walking can help get rid of the air that was put into your GI tract during the procedure and reduce the bloating. If you had a lower endoscopy (such as a colonoscopy or flexible sigmoidoscopy) you may notice spotting of blood in your stool or on the toilet paper. If you underwent a bowel prep for your procedure, you may not have a normal bowel movement for a few days.  Please Note:  You might notice some irritation and congestion in your nose or some drainage.  This is from the oxygen used during your procedure.  There is no need for concern and it should clear up in a day or so.  SYMPTOMS TO REPORT IMMEDIATELY:  Following lower endoscopy (colonoscopy or flexible sigmoidoscopy):  Excessive amounts of blood in the stool  Significant tenderness or worsening of abdominal pains  Swelling of the abdomen that is new, acute  Fever of 100F or higher  For urgent or emergent issues, a gastroenterologist can be reached at any hour by calling [phone]. Do not use MyChart messaging for urgent concerns.    DIET:  We do recommend a small meal at first, but then you may proceed to your regular diet.  Drink plenty of fluids but you should avoid alcoholic beverages for 24 hours.  ACTIVITY:  You should  plan to take it easy for the rest of today and you should NOT DRIVE or use heavy machinery until tomorrow (because of the sedation medicines used during the test).    FOLLOW UP: Our staff will call the number listed on your records 48-72 hours following your procedure to check on you and address any questions or concerns that you may have regarding the information given to you following your procedure. If we do not reach you, we will leave a message.  We will attempt to reach you two times.  During this call, we will ask if you have developed any symptoms of COVID 19. If you develop any symptoms (ie: fever, flu-like symptoms, shortness of breath, cough etc.) before then, please call [phone].  If you test positive for Covid 19 in the 2 weeks post procedure, please call and report this information to [place].    If any biopsies were taken you will be contacted by phone or by letter within the next 1-3 weeks.  Please call us at [phone] if you have not heard about the biopsies in 3 weeks.    SIGNATURES/CONFIDENTIALITY: You and/or your care partner have signed paperwork which will be entered into your electronic medical record.  These signatures attest to the fact that that the information above on your After Visit Summary has been reviewed and is understood.  Full responsibility of the confidentiality of this discharge information lies with you and/or your care-partner.

## 2021-02-14 NOTE — Progress Notes (Signed)
To PACU, VSS. Report to [email or web address] 

## 2021-02-14 NOTE — Progress Notes (Signed)
Called to room to assist during endoscopic procedure.  Patient ID and intended procedure confirmed with present staff. Received instructions for my participation in the procedure from the performing physician.  

## 2021-02-14 NOTE — Progress Notes (Signed)
VS completed by DT.  Pt's states no medical or surgical changes since previsit or office visit.  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

## 2021-02-14 NOTE — Op Note (Signed)
[facility] Patient Name: [name] Procedure Date: 02/14/2021 1:08 PM MRN: [id number] Endoscopist: Milus Banister , MD Age: 75 Referring MD:  Date of Birth: 27-Oct-1945 Gender: Female Account #: [id number] Procedure:                Colonoscopy Indications:              Screening for colorectal malignant neoplasm Medicines:                Monitored Anesthesia Care Procedure:                Pre-Anesthesia Assessment:                           - Prior to the procedure, a History and Physical                            was performed, and patient medications and                            allergies were reviewed. The patient's tolerance of                            previous anesthesia was also reviewed. The risks                            and benefits of the procedure and the sedation                            options and risks were discussed with the patient.                            All questions were answered, and informed consent                            was obtained. Prior Anticoagulants: The patient has                            taken Plavix (clopidogrel), last dose was 5 days                            prior to procedure. After reviewing the risks and                            benefits, the patient was deemed in satisfactory                            condition to undergo the procedure.                           After obtaining informed consent, the colonoscope                            was passed under direct vision. Throughout the  procedure, the patient's blood pressure, pulse, and                            oxygen saturations were monitored continuously. The                            CF [id number] #[id number] was introduced through the anus                            and advanced to the the cecum, identified by                            appendiceal orifice and ileocecal valve. The                            colonoscopy was performed  without difficulty. The                            patient tolerated the procedure well. The quality                            of the bowel preparation was good. The ileocecal                            valve, appendiceal orifice, and rectum were                            photographed. Scope In: 1:29:24 PM Scope Out: 1:39:51 PM Scope Withdrawal Time: 0 hours 7 minutes 21 seconds  Total Procedure Duration: 0 hours 10 minutes 27 seconds  Findings:                 Two sessile polyps were found in the transverse                            colon and ascending colon. The polyps were 3 to 4                            mm in size. These polyps were removed with a cold                            snare. Resection and retrieval were complete.                           Multiple small and large-mouthed diverticula were                            found in the entire colon.                           The exam was otherwise without abnormality on                            direct and retroflexion views. Complications:  No immediate complications. Estimated blood loss:                            None. Estimated Blood Loss:     Estimated blood loss: none. Impression:               - Two 3 to 4 mm polyps in the transverse colon and                            in the ascending colon, removed with a cold snare.                            Resected and retrieved.                           - Diverticulosis in the entire examined colon.                           - The examination was otherwise normal on direct                            and retroflexion views. Recommendation:           - Patient has a contact number available for                            emergencies. The signs and symptoms of potential                            delayed complications were discussed with the                            patient. Return to normal activities tomorrow.                            Written discharge  instructions were provided to the                            patient.                           - Resume previous diet.                           - Continue present medications. It is OK to resume                            your plavix today.                           - Await pathology results. Milus Banister, MD 02/14/2021 1:43:10 PM This report has been signed electronically.

## 2021-02-16 ENCOUNTER — Telehealth: Payer: Self-pay

## 2021-02-16 NOTE — Telephone Encounter (Signed)
  Follow up Call-  Call back number 02/14/2021 06/18/2020  Post procedure Call Back phone  # [phone]  Permission to leave phone message Yes Yes  Some recent data might be hidden     Patient questions:  Do you have a fever, pain , or abdominal swelling? No. Pain Score  0 *  Have you tolerated food without any problems? Yes.    Have you been able to return to your normal activities? Yes.    Do you have any questions about your discharge instructions: Diet   No. Medications  No. Follow up visit  No.  Do you have questions or concerns about your Care? No.  Actions: * If pain score is 4 or above: No action needed, pain <4.  Have you developed a fever since your procedure? no  2.   Have you had an respiratory symptoms (SOB or cough) since your procedure? no  3.   Have you tested positive for COVID 19 since your procedure no  4.   Have you had any family members/close contacts diagnosed with the COVID 19 since your procedure?  no   If yes to any of these questions please route to Joylene John, RN and Joella Prince, RN

## 2021-02-18 ENCOUNTER — Encounter: Payer: Self-pay | Admitting: Gastroenterology

## 2021-03-07 ENCOUNTER — Encounter (HOSPITAL_COMMUNITY): Payer: Self-pay | Admitting: *Deleted

## 2021-03-07 ENCOUNTER — Telehealth: Payer: Self-pay | Admitting: Nurse Practitioner

## 2021-03-07 NOTE — Telephone Encounter (Signed)
Patient notified and agreed.  

## 2021-03-07 NOTE — Telephone Encounter (Signed)
We will discuss at next office visit.

## 2021-03-07 NOTE — Telephone Encounter (Signed)
Pt recved letter by mail stating that 1 of polyps from recent colonscopy was abnormal & she was to contact pcp to determine next follow up for future screenings.  [name] doesn't understand what she needs to do. Please advise  Vilinda Blanks

## 2021-03-08 ENCOUNTER — Other Ambulatory Visit: Payer: Self-pay | Admitting: Nurse Practitioner

## 2021-03-08 DIAGNOSIS — I639 Cerebral infarction, unspecified: Secondary | ICD-10-CM

## 2021-03-08 NOTE — Telephone Encounter (Signed)
Patient has request refill on medication "Clopidogrel" and "Paxil". Patient medication refills are dated 09/08/2020. Medications have High Risk Warnings. Medications pend and sent to PCP Dewaine Oats Carlos American, NP for approval. Please Advise.

## 2021-03-14 ENCOUNTER — Encounter: Payer: Medicare Other | Admitting: Nurse Practitioner

## 2021-03-14 ENCOUNTER — Telehealth: Payer: Self-pay

## 2021-03-14 ENCOUNTER — Other Ambulatory Visit: Payer: Self-pay

## 2021-03-14 NOTE — Progress Notes (Signed)
   This service is provided via telemedicine  No vital signs collected/recorded due to the encounter was a telemedicine visit.   Location of patient (ex: home, work):  Home  Patient consents to a telephone visit: Yes, see telephone visit dated 03/14/21  Location of the provider (ex: office, home):  [facility], Office   Name of any referring provider:  N/A  Names of all persons participating in the telemedicine service and their role in the encounter:  S.Chrae B/CMA, [name], NP, and Patient   Time spent on call:  9 min with medical assistant

## 2021-03-14 NOTE — Telephone Encounter (Signed)
5 unsuccessful attempts made to reach patient for annual wellness visit scheduled via telephone call today.   1st attempt 3:50 pm, line was busy  2nd attempt, 3:51 pm, called brother , no answer and straight to recording stating no voicemail and I would need to try call again  3rd attempt 4:05 pm, line still busy  4th attempt 4:06 pm, called brother again, same recording as above  5th and final attempt 4:10 pm, line still busy  Message was sent to the administrative staff to un-arrive patients appointment and mark as no show. No show protocol to be followed in attempts to reschedule appointment

## 2021-04-22 ENCOUNTER — Ambulatory Visit (INDEPENDENT_AMBULATORY_CARE_PROVIDER_SITE_OTHER): Payer: 59 | Admitting: Family

## 2021-04-22 ENCOUNTER — Encounter: Payer: Self-pay | Admitting: Family

## 2021-04-22 ENCOUNTER — Other Ambulatory Visit: Payer: Self-pay

## 2021-04-22 VITALS — BP 136/62 | HR 101 | Temp 97.3°F | Resp 16 | Ht 65.0 in | Wt 181.6 lb

## 2021-04-22 DIAGNOSIS — K644 Residual hemorrhoidal skin tags: Secondary | ICD-10-CM | POA: Diagnosis not present

## 2021-04-22 DIAGNOSIS — M25522 Pain in left elbow: Secondary | ICD-10-CM

## 2021-04-22 MED ORDER — PHENYLEPHRINE-MINERAL OIL-PET 0.25-14-74.9 % RE OINT: 1.0000 "application " | TOPICAL_OINTMENT | Freq: Two times a day (BID) | RECTAL | 1 refills | Status: AC | PRN

## 2021-04-22 NOTE — Patient Instructions (Addendum)
- Avoid placing elbow on arm chair for prolong time while smoking to avoid pressure.  - May take Tylenol 500 mg tablet every 8 hours  as needed for pain   Hemorrhoids Hemorrhoids are swollen veins that may develop: In the butt (rectum). These are called internal hemorrhoids. Around the opening of the butt (anus). These are called external hemorrhoids. Hemorrhoids can cause pain, itching, or bleeding. Most of the time, they do not cause serious problems. They usually get better with diet changes, lifestyle changes, and other home treatments. What are the causes? This condition may be caused by: Having trouble pooping (constipation). Pushing hard (straining) to poop. Watery poop (diarrhea). Pregnancy. Being very overweight (obese). Sitting for long periods of time. Heavy lifting or other activity that causes you to strain. Anal sex. Riding a bike for a long period of time. What are the signs or symptoms? Symptoms of this condition include: Pain. Itching or soreness in the butt. Bleeding from the butt. Leaking poop. Swelling in the area. One or more lumps around the opening of your butt. How is this diagnosed? A doctor can often diagnose this condition by looking at the affected area. The doctor may also: Do an exam that involves feeling the area with a gloved hand (digital rectal exam). Examine the area inside your butt using a small tube (anoscope). Order blood tests. This may be done if you have lost a lot of blood. Have you get a test that involves looking inside the colon using a flexible tube with a camera on the end (sigmoidoscopy or colonoscopy). How is this treated? This condition can usually be treated at home. Your doctor may tell you to change what you eat, make lifestyle changes, or try home treatments. If these do not help, procedures can be done to remove the hemorrhoids or make them smaller. These may involve: Placing rubber bands at the base of the hemorrhoids to cut  off their blood supply. Injecting medicine into the hemorrhoids to shrink them. Shining a type of light energy onto the hemorrhoids to cause them to fall off. Doing surgery to remove the hemorrhoids or cut off their blood supply. Follow these instructions at home: Eating and drinking  Eat foods that have a lot of fiber in them. These include whole grains, beans, nuts, fruits, and vegetables. Ask your doctor about taking products that have added fiber (fibersupplements). Reduce the amount of fat in your diet. You can do this by: Eating low-fat dairy products. Eating less red meat. Avoiding processed foods. Drink enough fluid to keep your pee (urine) pale yellow. Managing pain and swelling  Take a warm-water bath (sitz bath) for 20 minutes to ease pain. Do this 3-4 times a day. You may do this in a bathtub or using a portable sitz bath that fits over the toilet. If told, put ice on the painful area. It may be helpful to use ice between your warm baths. Put ice in a plastic bag. Place a towel between your skin and the bag. Leave the ice on for 20 minutes, 2-3 times a day. General instructions Take over-the-counter and prescription medicines only as told by your doctor. Medicated creams and medicines may be used as told. Exercise often. Ask your doctor how much and what kind of exercise is best for you. Go to the bathroom when you have the urge to poop. Do not wait. Avoid pushing too hard when you poop. Keep your butt dry and clean. Use wet toilet paper or moist  towelettes after pooping. Do not sit on the toilet for a long time. Keep all follow-up visits as told by your doctor. This is important. Contact a doctor if you: Have pain and swelling that do not get better with treatment or medicine. Have trouble pooping. Cannot poop. Have pain or swelling outside the area of the hemorrhoids. Get help right away if you have: Bleeding that will not stop. Summary Hemorrhoids are swollen  veins in the butt or around the opening of the butt. They can cause pain, itching, or bleeding. Eat foods that have a lot of fiber in them. These include whole grains, beans, nuts, fruits, and vegetables. Take a warm-water bath (sitz bath) for 20 minutes to ease pain. Do this 3-4 times a day. This information is not intended to replace advice given to you by your health care provider. Make sure you discuss any questions you have with your health care provider. Document Revised: 10/20/2020 Document Reviewed: 10/20/2020 Elsevier Patient Education  [place].

## 2021-04-22 NOTE — Progress Notes (Signed)
Provider: Damare Serano FNP-C  [name], NP  Patient Care Team: [name], NP as PCP - General (Nurse Practitioner) [name], MD as Consulting Physician (Ophthalmology) [name], MD as Consulting Physician (Nephrology) [name], NP as Nurse Practitioner (Nurse Practitioner)  Extended Emergency Contact Information Primary Emergency Contact: [facility] Address: [address]          [place], [place] [address] Johnnette Litter of [place] Phone: [phone] Mobile Phone: [phone] Relation: Brother  Code Status:  DNR Goals of care: Advanced Directive information Advanced Directives 04/22/2021  Does Patient Have a Medical Advance Directive? Yes  Type of Advance Directive Out of facility DNR (pink MOST or yellow form)  Does patient want to make changes to medical advance directive? No - Patient declined  Would patient like information on creating a medical advance directive? -  Pre-existing out of facility DNR order (yellow form or pink MOST form) -     Chief Complaint  Patient presents with   Acute Visit    Patient complains of left elbow pain and blood in bowel movement. Patient states these symptoms started last week.      HPI:  Pt is a 75 y.o. female seen today for an acute visit for evaluation of left elbow pain x week.pain is constant.Feels like " everything feels like it wants to fall" especially when smoking she has a tendency put arm on the chair while smoking. No numbness or tingling on the hand.No hand weakness.No previous surgery or injuries.   Also complains of blood in the stool x 1 week.she saw blood when wiping once but none since then. Has significant history of hemorrhoid.   Past Medical History:  Diagnosis Date   Anemia    Anxiety    Arthritis    lower back and knees    Asthma    childhood   Barrett's esophagus    Chronic kidney disease    Coagulation defect (HCC)    Depression    Diabetes  mellitus    Dysphagia    Eczema    End stage renal disease (HCC)    Fluid overload, unspecified    History of herpes zoster 02/2010   Recovered fully after period of acute herpetic neuralgia tx w/ gabapentin.    Hx MRSA infection 2009   Hypercalcemia    Hyperlipidemia    Hypertension    Moderate protein-calorie malnutrition (HCC)    Neuromuscular disorder ([place])    chronic pain   Personal history of colonic polyps 10/17/2010   hyperplastic   Past Surgical History:  Procedure Laterality Date   ABDOMINAL HYSTERECTOMY     unclear when   AV FISTULA PLACEMENT Left 02/13/2017   Procedure: ARTERIOVENOUS (AV) GORE-TEX STRETCH GRAFT INSERTION INTO LEFT ARM;  Surgeon: Elam Dutch, MD;  Location: [facility];  Service: Vascular;  Laterality: Left;   CATARACT EXTRACTION Bilateral    COLONOSCOPY     thigh surg     left side due to MRSA   UPPER GASTROINTESTINAL ENDOSCOPY      Allergies  Allergen Reactions   Lisinopril Other (See Comments)    Abnormal Kidney Function    Pioglitazone Other (See Comments)    REACTION: Desquamation of skin of the palm   Morphine Other (See Comments)   Other Nausea And Vomiting   Morphine And Related Nausea And Vomiting   Sulfonamide Derivatives Rash    Outpatient Encounter Medications as of 04/22/2021  Medication Sig   aspirin 81  MG chewable tablet Chew 1 tablet (81 mg total) by mouth daily.   calcium acetate (PHOSLO) 667 MG capsule Take by mouth 3 (three) times daily with meals.   cinacalcet (SENSIPAR) 30 MG tablet Take 30 mg by mouth daily.   clopidogrel (PLAVIX) 75 MG tablet TAKE 1 TABLET BY MOUTH EVERY DAY   denosumab (PROLIA) 60 MG/ML SOSY injection TO BE ADMINISTERED IN PHYSICIAN'S OFFICE. INJECT ONE SYRINGE SUBCOUSLY ONCE EVERY 6 MONTHS. REFRIGERATE. USE WITHIN 14 DAYS ONCE AT ROOM TEMPERATURE.   ezetimibe (ZETIA) 10 MG tablet TAKE 1 TABLET BY MOUTH EVERY DAY   pantoprazole (PROTONIX) 40 MG tablet Take 1 tablet (40 mg total) by mouth daily.    PARoxetine (PAXIL) 40 MG tablet TAKE 1 TABLET BY MOUTH EVERY DAY   pravastatin (PRAVACHOL) 40 MG tablet TAKE 1 TABLET BY MOUTH EVERYDAY AT BEDTIME   sevelamer carbonate (RENVELA) 2.4 g PACK 2.4 g 3 (three) times daily with meals.   traZODone (DESYREL) 100 MG tablet Take 1 tablet (100 mg total) by mouth at bedtime.   triamcinolone cream (KENALOG) 0.1 % APPLY 1 APPLICATION TOPICALLY 3 (THREE) TIMES DAILY AS NEEDED (RASH).   No facility-administered encounter medications on file as of 04/22/2021.    Review of Systems  Constitutional:  Negative for appetite change, chills, fatigue and fever.  Respiratory:  Negative for cough, chest tightness, shortness of breath and wheezing.   Gastrointestinal:  Negative for abdominal distention, abdominal pain, blood in stool, diarrhea, nausea and vomiting.       Blood with wiping after bowel movement one week ago   Musculoskeletal:  Negative for joint swelling.       Left elbow pain   Neurological:  Negative for dizziness, weakness, light-headedness and headaches.   Immunization History  Administered Date(s) Administered   Hepatitis B, adult 06/21/2017, 07/19/2017, 08/18/2017, 09/11/2019, 10/09/2019   Hepb-cpg 02/12/2020   Influenza Split 02/03/2011, 01/12/2012   Influenza Whole 01/07/2010   Influenza, High Dose Seasonal PF 01/08/2017, 01/22/2018, 01/16/2019, 01/22/2020, 01/18/2021   Influenza,inj,Quad PF,6+ Mos 01/03/2013, 02/12/2014, 01/15/2015, 01/06/2016   Influenza-Unspecified 01/23/2019   Moderna Sars-Covid-2 Vaccination 06/05/2019, 07/03/2019   PFIZER(Purple Top)SARS-COV-2 Vaccination 01/08/2020, 11/08/2020   Pneumococcal Conjugate-13 04/22/2015   Pneumococcal Polysaccharide-23 01/12/2012   Td 11/23/2008   Tdap 04/07/2020   Zoster Recombinat (Shingrix) 02/13/2018, 12/03/2019   Zoster, Live 04/24/2014   Pertinent  Health Maintenance Due  Topic Date Due   OPHTHALMOLOGY EXAM  02/01/2021   FOOT EXAM  03/03/2021   HEMOGLOBIN A1C  03/08/2021    LIPID PANEL  12/06/2021   COLONOSCOPY (Pts 45-4yrs Insurance coverage will need to be confirmed)  02/15/2031   INFLUENZA VACCINE  Completed   DEXA SCAN  Completed   Fall Risk 08/22/2019 03/03/2020 03/12/2020 12/03/2020 04/22/2021  Falls in the past year? 0 0 0 0 0  Was there an injury with Fall? 0 0 0 0 0  Was there an injury with Fall? - - - - -  Fall Risk Category Calculator 0 0 0 0 0  Fall Risk Category Low Low Low Low Low  Patient Fall Risk Level Moderate fall risk Low fall risk Low fall risk Low fall risk Low fall risk  Patient at Risk for Falls Due to - - - No Fall Risks No Fall Risks  Patient at Risk for Falls Due to - - - - -  Fall risk Follow up - - - Falls evaluation completed Falls evaluation completed   Functional Status Survey:    Vitals:  04/22/21 0930  BP: 136/62  Pulse: (!) 101  Resp: 16  Temp: (!) 97.3 F (36.3 C)  SpO2: 98%  Weight: 181 lb 9.6 oz (82.4 kg)  Height: 5\' 5"  (1.651 m)   Body mass index is 30.22 kg/m. Physical Exam Vitals reviewed. Exam conducted with a chaperone present [facility],CMA).  Constitutional:      General: She is not in acute distress.    Appearance: Normal appearance. She is obese. She is not ill-appearing or diaphoretic.  HENT:     Head: Normocephalic.  Cardiovascular:     Rate and Rhythm: Normal rate and regular rhythm.     Pulses: Normal pulses.     Heart sounds: Normal heart sounds. No murmur heard.   No friction rub. No gallop.     Comments: Left arm AV fistula positive for bruit and thrill  Pulmonary:     Effort: Pulmonary effort is normal. No respiratory distress.     Breath sounds: Normal breath sounds. No wheezing, rhonchi or rales.  Chest:     Chest wall: No tenderness.  Abdominal:     General: Bowel sounds are normal. There is no distension.     Palpations: Abdomen is soft. There is no mass.     Tenderness: There is no abdominal tenderness. There is no right CVA tenderness, left CVA tenderness,  guarding or rebound.  Genitourinary:    Exam position: Knee-chest position.     Rectum: Guaiac result negative. External hemorrhoid present. No mass, tenderness, anal fissure or internal hemorrhoid. Normal anal tone.     Comments: Small laceration on external hemorrhoid without any bleeding  Musculoskeletal:        General: No swelling or tenderness. Normal range of motion.     Right elbow: No swelling or effusion. Normal range of motion. No tenderness.     Left elbow: Normal.  Skin:    General: Skin is warm and dry.     Coloration: Skin is not pale.     Findings: No bruising, erythema or rash.     Comments: Dialysis AV fistula site without any signs of infection.   Neurological:     Mental Status: She is alert and oriented to person, place, and time.     Motor: No weakness.     Gait: Gait abnormal.  Psychiatric:        Mood and Affect: Mood normal.        Speech: Speech normal.        Behavior: Behavior normal.    Labs reviewed: Recent Labs    04/24/20 0000  K 4.9  CALCIUM 9.7   Recent Labs    04/24/20 0000  ALBUMIN 4.0   Recent Labs    04/24/20 0000  HGB 10.9*   Lab Results  Component Value Date   TSH 1.74 08/22/2019   Lab Results  Component Value Date   HGBA1C 4.7 12/06/2020   Lab Results  Component Value Date   CHOL 120 12/06/2020   HDL 45 (L) 12/06/2020   LDLCALC 55 12/06/2020   LDLDIRECT 127 (H) 07/17/2012   TRIG 113 12/06/2020   CHOLHDL 2.7 12/06/2020    Significant Diagnostic Results in last 30 days:  No results found.  Assessment/Plan 1. Left elbow pain Has tendency to place elbow on arm of chair when smoking.Advised to avoid prolong pressure on elbow.  - May take OTC tylenol 500 mg tablet one by mouth every 8 hrs as needed for pain.will avoid NSAID's since she is  on Dialysis.  - Notify provider if symptoms worsen.   2. External hemorrhoid small laceration on external hemorrhoid at 4 - 5  O'clock position without any bleeding  noted.Non-tender to palpation.  Had bleeding one week after hard stool but none since then.Advised to increase fibre in diet to prevent constipation - phenylephrine-shark liver oil-mineral oil-petrolatum (PREPARATION H) 0.25-14-74.9 % rectal ointment; Place 1 application rectally 2 (two) times daily as needed for up to 7 days for hemorrhoids.  Dispense: 28 g; Refill: 1 - Additional education information provided on AVS    Family/ staff Communication: Reviewed plan of care with patient verbalized understanding   Labs/tests ordered: None   Next Appointment: As needed if symptoms worsen or fail to improve    Sandrea Hughs, NP

## 2021-04-24 LAB — HEMOGLOBIN A1C: Hemoglobin A1C: 4.9

## 2021-04-24 LAB — COMPREHENSIVE METABOLIC PANEL
Albumin: 3.9 (ref 3.5–5.0)
Calcium: 9.4 (ref 8.7–10.7)

## 2021-04-24 LAB — CBC AND DIFFERENTIAL: Hemoglobin: 10.1 — AB (ref 12.0–16.0)

## 2021-04-24 LAB — BASIC METABOLIC PANEL
Glucose: 97
Potassium: 4.2 (ref 3.4–5.3)

## 2021-05-09 ENCOUNTER — Other Ambulatory Visit: Payer: Self-pay | Admitting: *Deleted

## 2021-05-09 MED ORDER — PROLIA 60 MG/ML ~~LOC~~ SOSY: PREFILLED_SYRINGE | SUBCUTANEOUS | 0 refills | Status: DC

## 2021-05-09 NOTE — Telephone Encounter (Signed)
[facility] requested refill.

## 2021-05-13 ENCOUNTER — Telehealth: Payer: Self-pay | Admitting: *Deleted

## 2021-05-13 NOTE — Telephone Encounter (Signed)
Lattie Haw brought message stating I needed to Call [facility] regarding patient's [name] [phone] and spoke with York Cerise and confirmation was given to ship. Will be shipping to the office address on 05/19/2021.

## 2021-05-18 ENCOUNTER — Other Ambulatory Visit: Payer: Self-pay | Admitting: Gastroenterology

## 2021-05-25 LAB — COMPREHENSIVE METABOLIC PANEL
Albumin: 4.2 (ref 3.5–5.0)
Calcium: 9.4 (ref 8.7–10.7)

## 2021-05-25 LAB — CBC AND DIFFERENTIAL: Hemoglobin: 10 — AB (ref 12.0–16.0)

## 2021-05-25 LAB — BASIC METABOLIC PANEL
Glucose: 86
Potassium: 3.8 (ref 3.4–5.3)

## 2021-05-30 ENCOUNTER — Other Ambulatory Visit: Payer: Self-pay | Admitting: Nurse Practitioner

## 2021-05-30 DIAGNOSIS — G4700 Insomnia, unspecified: Secondary | ICD-10-CM

## 2021-05-30 NOTE — Telephone Encounter (Signed)
Patient has request a refill on medication "Trazodone 100mg ". Patient medication last refilled 12/03/2020. Patient medication has High Risk Warnings. Medication pend and sent to PCP Dewaine Oats Carlos American, NP for approval. Please Advise.

## 2021-06-10 ENCOUNTER — Encounter: Payer: Self-pay | Admitting: Nurse Practitioner

## 2021-06-10 ENCOUNTER — Ambulatory Visit (INDEPENDENT_AMBULATORY_CARE_PROVIDER_SITE_OTHER): Payer: 59 | Admitting: Nurse Practitioner

## 2021-06-10 ENCOUNTER — Other Ambulatory Visit: Payer: Self-pay

## 2021-06-10 VITALS — BP 142/84 | HR 96 | Temp 97.1°F | Ht 64.5 in | Wt 174.0 lb

## 2021-06-10 DIAGNOSIS — K22719 Barrett's esophagus with dysplasia, unspecified: Secondary | ICD-10-CM

## 2021-06-10 DIAGNOSIS — F320 Major depressive disorder, single episode, mild: Secondary | ICD-10-CM

## 2021-06-10 DIAGNOSIS — N186 End stage renal disease: Secondary | ICD-10-CM

## 2021-06-10 DIAGNOSIS — E785 Hyperlipidemia, unspecified: Secondary | ICD-10-CM

## 2021-06-10 DIAGNOSIS — F17200 Nicotine dependence, unspecified, uncomplicated: Secondary | ICD-10-CM

## 2021-06-10 DIAGNOSIS — M810 Age-related osteoporosis without current pathological fracture: Secondary | ICD-10-CM

## 2021-06-10 DIAGNOSIS — E1122 Type 2 diabetes mellitus with diabetic chronic kidney disease: Secondary | ICD-10-CM

## 2021-06-10 DIAGNOSIS — I10 Essential (primary) hypertension: Secondary | ICD-10-CM

## 2021-06-10 DIAGNOSIS — Z8673 Personal history of transient ischemic attack (TIA), and cerebral infarction without residual deficits: Secondary | ICD-10-CM

## 2021-06-10 DIAGNOSIS — G4700 Insomnia, unspecified: Secondary | ICD-10-CM

## 2021-06-10 DIAGNOSIS — N185 Chronic kidney disease, stage 5: Secondary | ICD-10-CM

## 2021-06-10 DIAGNOSIS — Z992 Dependence on renal dialysis: Secondary | ICD-10-CM

## 2021-06-10 DIAGNOSIS — Z794 Long term (current) use of insulin: Secondary | ICD-10-CM

## 2021-06-10 DIAGNOSIS — D631 Anemia in chronic kidney disease: Secondary | ICD-10-CM

## 2021-06-10 MED ORDER — DENOSUMAB 60 MG/ML ~~LOC~~ SOSY
60.0000 mg | PREFILLED_SYRINGE | Freq: Once | SUBCUTANEOUS | Status: AC
  Administered 2021-06-10: 60 mg via SUBCUTANEOUS

## 2021-06-10 NOTE — Progress Notes (Signed)
Careteam: Patient Care Team: [name], NP as PCP - General (Nurse Practitioner) [name], MD as Consulting Physician (Ophthalmology) [name], MD as Consulting Physician (Nephrology) [name], NP as Nurse Practitioner (Nurse Practitioner)  PLACE OF SERVICE:  [place] Directive information Does Patient Have a Medical Advance Directive?: Yes, Type of Advance Directive: Out of facility DNR (pink MOST or yellow form), Pre-existing out of facility DNR order (yellow form or pink MOST form): Yellow form placed in chart (order not valid for inpatient use), Does patient want to make changes to medical advance directive?: No - Patient declined  Allergies  Allergen Reactions   Lisinopril Other (See Comments)    Abnormal Kidney Function    Pioglitazone Other (See Comments)    REACTION: Desquamation of skin of the palm   Morphine Other (See Comments)   Other Nausea And Vomiting   Morphine And Related Nausea And Vomiting   Sulfonamide Derivatives Rash    Chief Complaint  Patient presents with   Medical Management of Chronic Issues    6 month follow-up and prolia injection. Foot exam today. Discuss need for eye exam a1c, and covid booster or post pone if patient refuses.      HPI: Patient is a 76 y.o. female here for 6 months follow up.  States she has no issues with sleep.   Has no issues with depression. States it is stable  No issues with blood pressure, checks 3 times weekly when at dialysis.  No issues with any medications. States she takes them as directed.    Diet: eats a balanced diet  Exercise: Walks daily   Smoker: Yes, smokes 1 to 2 packs per week. No interest in quiting.   Prolia administered this office visit.   States she will get a covid booster when convenient.    Review of Systems:  Review of Systems  Constitutional:  Negative for fever, malaise/fatigue and weight loss.  Respiratory:  Negative for cough,  shortness of breath and wheezing.   Cardiovascular:  Negative for chest pain, palpitations and leg swelling.  Gastrointestinal:  Negative for constipation, diarrhea, heartburn, nausea and vomiting.  Genitourinary:  Negative for dysuria and frequency.  Musculoskeletal:  Negative for myalgias.  Psychiatric/Behavioral:  The patient does not have insomnia.    Past Medical History:  Diagnosis Date   Anemia    Anxiety    Arthritis    lower back and knees    Asthma    childhood   Barrett's esophagus    Chronic kidney disease    Coagulation defect (HCC)    Depression    Diabetes mellitus    Dysphagia    Eczema    End stage renal disease (HCC)    Fluid overload, unspecified    History of herpes zoster 02/2010   Recovered fully after period of acute herpetic neuralgia tx w/ gabapentin.    Hx MRSA infection 2009   Hypercalcemia    Hyperlipidemia    Hypertension    Moderate protein-calorie malnutrition (HCC)    Neuromuscular disorder ([employer])    chronic pain   Personal history of colonic polyps 10/17/2010   hyperplastic   Past Surgical History:  Procedure Laterality Date   ABDOMINAL HYSTERECTOMY     unclear when   AV FISTULA PLACEMENT Left 02/13/2017   Procedure: ARTERIOVENOUS (AV) GORE-TEX STRETCH GRAFT INSERTION INTO LEFT ARM;  Surgeon: Elam Dutch, MD;  Location: [facility];  Service: Vascular;  Laterality: Left;  CATARACT EXTRACTION Bilateral    COLONOSCOPY     thigh surg     left side due to MRSA   UPPER GASTROINTESTINAL ENDOSCOPY     Social History:   reports that she has been smoking cigarettes. She has a 20.00 pack-year smoking history. She has never used smokeless tobacco. She reports current alcohol use of about 2.0 standard drinks per week. She reports that she does not use drugs.  Family History  Problem Relation Age of Onset   Hypertension Father    Kidney disease Father    Diabetes Brother    Colon cancer Neg Hx    CAD Neg Hx    Rectal cancer Neg Hx     Stomach cancer Neg Hx     Medications: Patient's Medications  New Prescriptions   No medications on file  Previous Medications   ASPIRIN 81 MG CHEWABLE TABLET    Chew 1 tablet (81 mg total) by mouth daily.   CALCIUM ACETATE (PHOSLO) 667 MG CAPSULE    Take by mouth 3 (three) times daily with meals.   CINACALCET (SENSIPAR) 30 MG TABLET    Take 30 mg by mouth daily.   CLOPIDOGREL (PLAVIX) 75 MG TABLET    TAKE 1 TABLET BY MOUTH EVERY DAY   DENOSUMAB (PROLIA) 60 MG/ML SOSY INJECTION    TO BE ADMINISTERED IN PHYSICIAN'S OFFICE. INJECT ONE SYRINGE SUBCOUSLY ONCE EVERY 6 MONTHS. REFRIGERATE. USE WITHIN 14 DAYS ONCE AT ROOM TEMPERATURE.   EZETIMIBE (ZETIA) 10 MG TABLET    TAKE 1 TABLET BY MOUTH EVERY DAY   PANTOPRAZOLE (PROTONIX) 40 MG TABLET    TAKE 1 TABLET BY MOUTH EVERY DAY   PAROXETINE (PAXIL) 40 MG TABLET    TAKE 1 TABLET BY MOUTH EVERY DAY   PRAVASTATIN (PRAVACHOL) 40 MG TABLET    TAKE 1 TABLET BY MOUTH EVERYDAY AT BEDTIME   SEVELAMER CARBONATE (RENVELA) 2.4 G PACK    2.4 g 3 (three) times daily with meals.   TRAZODONE (DESYREL) 100 MG TABLET    TAKE 1 TABLET BY MOUTH EVERYDAY AT BEDTIME   TRIAMCINOLONE CREAM (KENALOG) 0.1 %    APPLY 1 APPLICATION TOPICALLY 3 (THREE) TIMES DAILY AS NEEDED (RASH).  Modified Medications   No medications on file  Discontinued Medications   No medications on file    Physical Exam:  Vitals:   06/10/21 1005  BP: (!) 142/84  Pulse: 96  Temp: (!) 97.1 F (36.2 C)  TempSrc: Temporal  SpO2: 96%  Weight: 174 lb (78.9 kg)  Height: 5' 4.5" (1.638 m)   Body mass index is 29.41 kg/m. Wt Readings from Last 3 Encounters:  06/10/21 174 lb (78.9 kg)  04/22/21 181 lb 9.6 oz (82.4 kg)  02/14/21 176 lb (79.8 kg)    Physical Exam Constitutional:      General: She is not in acute distress.    Appearance: Normal appearance. She is not ill-appearing.  HENT:     Right Ear: Tympanic membrane, ear canal and external ear normal.     Left Ear: Tympanic membrane,  ear canal and external ear normal.  Eyes:     Conjunctiva/sclera: Conjunctivae normal.  Cardiovascular:     Rate and Rhythm: Normal rate and regular rhythm.     Pulses: Normal pulses.     Heart sounds: Normal heart sounds.  Pulmonary:     Effort: Pulmonary effort is normal.     Breath sounds: Normal breath sounds.     Comments: Decreased breath sounds  Abdominal:     General: Bowel sounds are normal.  Musculoskeletal:        General: Normal range of motion.  Neurological:     Mental Status: She is alert and oriented to person, place, and time.     Coordination: Coordination normal.     Gait: Gait normal.  Psychiatric:        Mood and Affect: Mood normal.        Behavior: Behavior normal.        Thought Content: Thought content normal.   Diabetic Foot Exam - Simple   Simple Foot Form  06/10/2021 10:40 AM  Visual Inspection No deformities, no ulcerations, no other skin breakdown bilaterally: Yes Sensation Testing Intact to touch and monofilament testing bilaterally: Yes Pulse Check Posterior Tibialis and Dorsalis pulse intact bilaterally: Yes Comments     Labs reviewed: Basic Metabolic Panel: No results for input(s): NA, K, CL, CO2, GLUCOSE, BUN, CREATININE, CALCIUM, MG, PHOS, TSH in the last 8760 hours. Liver Function Tests: No results for input(s): AST, ALT, ALKPHOS, BILITOT, PROT, ALBUMIN in the last 8760 hours. No results for input(s): LIPASE, AMYLASE in the last 8760 hours. No results for input(s): AMMONIA in the last 8760 hours. CBC: No results for input(s): WBC, NEUTROABS, HGB, HCT, MCV, PLT in the last 8760 hours. Lipid Panel: Recent Labs    12/06/20 1016  CHOL 120  HDL 45*  LDLCALC 55  TRIG 113  CHOLHDL 2.7   TSH: No results for input(s): TSH in the last 8760 hours. A1C: Lab Results  Component Value Date   HGBA1C 4.7 12/06/2020     Assessment/Plan  1. Senile osteoporosis -weight bearing exercises encouraged.  - denosumab (PROLIA) injection 60  mg -Condition is stable,no issues with medication or disease process.  2. Insomnia, unspecified type -Takes trazodone 100 mg HS and helps with sleep. -No issues, stable   3. ESRD (end stage renal disease) on dialysis [facility]) -On dialysis Tuesday, Thursdays and Saturdays.  -No issues, stable -continues on renvela and sensipar per nephrology  4. Controlled type 2 diabetes mellitus with chronic kidney disease on chronic dialysis, with long-term current use of insulin (HCC) - Hemoglobin A1c ordered  -Stable  and at goal on no medication.   5. Mild single current episode of major depressive disorder ([place]) -controlled on paxil.   6. Hyperlipidemia with target LDL less than 70 -12/06/20 LDL 55 - Lipid panel ordered -continues on zetia and pravachol - Hepatic Function Panel ordered  -Stable condition   7. Smoker -Current smoker, 1 to 2 packs per week -Not ready to wean off smoking.  -Smoking cessation encouraged.   8. Anemia due to stage 5 chronic kidney disease, not on chronic dialysis ([place]) -Stable condition, managed by nephrology, did not bring update labs today.   9. History of CVA (cerebrovascular accident) - Takes Aspirin 81 mg  -Takes Plavix 75 mg QD -Takes Pravastatin 40 mg QD -Stable, or concerns   10. Essential hypertension - BP today is 141/ 84; necessary BP for dialysis 3 times weekly  - No issues or concerns   11. Barrett's esophagus with dysplasia -Stable, no issues    Next appt: Return in about 6 months (around 12/08/2021) for routine follow up and prolia injection .  Carlos American. Harle Battiest I personally was present during the history, physical exam and medical decision-making activities of this service and have verified that the service and findings are accurately documented in the students note [facility] [phone]

## 2021-06-11 LAB — HEMOGLOBIN A1C
Hgb A1c MFr Bld: 4.6 % of total Hgb (ref <5.7)
Mean Plasma Glucose: 85 mg/dL
eAG (mmol/L): 4.7 mmol/L

## 2021-06-11 LAB — HEPATIC FUNCTION PANEL
AG Ratio: 1.6 (calc) (ref 1.0–2.5)
ALT: 29 U/L (ref 6–29)
AST: 19 U/L (ref 10–35)
Albumin: 4.1 g/dL (ref 3.6–5.1)
Alkaline phosphatase (APISO): 92 U/L (ref 37–153)
Bilirubin, Direct: 0 mg/dL (ref 0.0–0.2)
Globulin: 2.5 g/dL (calc) (ref 1.9–3.7)
Indirect Bilirubin: 0.4 mg/dL (calc) (ref 0.2–1.2)
Total Bilirubin: 0.4 mg/dL (ref 0.2–1.2)
Total Protein: 6.6 g/dL (ref 6.1–8.1)

## 2021-06-11 LAB — LIPID PANEL
Cholesterol: 151 mg/dL (ref <200)
HDL: 47 mg/dL — ABNORMAL LOW (ref >=50)
LDL Cholesterol (Calc): 68 mg/dL (calc)
Non-HDL Cholesterol (Calc): 104 mg/dL (calc) (ref <130)
Total CHOL/HDL Ratio: 3.2 (calc) (ref <5.0)
Triglycerides: 272 mg/dL — ABNORMAL HIGH (ref <150)

## 2021-06-17 ENCOUNTER — Other Ambulatory Visit: Payer: Self-pay

## 2021-06-17 ENCOUNTER — Ambulatory Visit (INDEPENDENT_AMBULATORY_CARE_PROVIDER_SITE_OTHER): Payer: 59 | Admitting: Family

## 2021-06-17 ENCOUNTER — Encounter: Payer: Self-pay | Admitting: Family

## 2021-06-17 VITALS — BP 160/90 | HR 98 | Temp 95.6°F | Resp 18 | Ht 64.5 in | Wt 175.2 lb

## 2021-06-17 DIAGNOSIS — L03211 Cellulitis of face: Secondary | ICD-10-CM | POA: Diagnosis not present

## 2021-06-17 DIAGNOSIS — M272 Inflammatory conditions of jaws: Secondary | ICD-10-CM

## 2021-06-17 MED ORDER — ACETAMINOPHEN 500 MG PO TABS: 500.0000 mg | ORAL_TABLET | Freq: Four times a day (QID) | ORAL | 0 refills | Status: DC | PRN

## 2021-06-17 MED ORDER — AMOXICILLIN-POT CLAVULANATE 500-125 MG PO TABS: 1.0000 | ORAL_TABLET | Freq: Two times a day (BID) | ORAL | 0 refills | Status: AC

## 2021-06-17 MED ORDER — CEFTRIAXONE SODIUM 1 G IJ SOLR
1.0000 g | INTRAMUSCULAR | Status: DC
  Administered 2021-06-17: 1 g via INTRAMUSCULAR

## 2021-06-17 NOTE — Progress Notes (Signed)
Provider: Jacqualin Shirkey FNP-C  [name], NP  Patient Care Team: [name], NP as PCP - General (Nurse Practitioner) [name], MD as Consulting Physician (Ophthalmology) [name], MD as Consulting Physician (Nephrology) [name], NP as Nurse Practitioner (Nurse Practitioner)  Extended Emergency Contact Information Primary Emergency Contact: [facility] Address: [address]          [place], [place] [address] [name] of [place] Phone: [phone] Mobile Phone: [phone] Relation: Brother  Code Status:  DNR Goals of care: Advanced Directive information Advanced Directives 06/17/2021  Does Patient Have a Medical Advance Directive? Yes  Type of Advance Directive Out of facility DNR (pink MOST or yellow form)  Does patient want to make changes to medical advance directive? No - Patient declined  Would patient like information on creating a medical advance directive? -  Pre-existing out of facility DNR order (yellow form or pink MOST form) -     Chief Complaint  Patient presents with   Acute Visit    Patient complains of left side of face swelling since 3 days ago.    HPI:  Pt is a 76 y.o. female seen today for an acute visit for evaluation of left side face swelling x 3 days. Painful to touch left side face. She denies any fever,chills.also denies any injuries or fall.does have several broken teeth.Appetite is good and no difficulties swallowing.    Past Medical History:  Diagnosis Date   Anemia    Anxiety    Arthritis    lower back and knees    Asthma    childhood   Barrett's esophagus    Chronic kidney disease    Coagulation defect (HCC)    Depression    Diabetes mellitus    Dysphagia    Eczema    End stage renal disease (HCC)    Fluid overload, unspecified    History of herpes zoster 02/2010   Recovered fully after period of acute herpetic neuralgia tx w/ gabapentin.    Hx MRSA infection 2009    Hypercalcemia    Hyperlipidemia    Hypertension    Moderate protein-calorie malnutrition (HCC)    Neuromuscular disorder ([place])    chronic pain   Personal history of colonic polyps 10/17/2010   hyperplastic   Past Surgical History:  Procedure Laterality Date   ABDOMINAL HYSTERECTOMY     unclear when   AV FISTULA PLACEMENT Left 02/13/2017   Procedure: ARTERIOVENOUS (AV) GORE-TEX STRETCH GRAFT INSERTION INTO LEFT ARM;  Surgeon: Elam Dutch, MD;  Location: [facility];  Service: Vascular;  Laterality: Left;   CATARACT EXTRACTION Bilateral    COLONOSCOPY     thigh surg     left side due to MRSA   UPPER GASTROINTESTINAL ENDOSCOPY      Allergies  Allergen Reactions   Lisinopril Other (See Comments)    Abnormal Kidney Function    Pioglitazone Other (See Comments)    REACTION: Desquamation of skin of the palm   Morphine Other (See Comments)   Other Nausea And Vomiting   Morphine And Related Nausea And Vomiting   Sulfonamide Derivatives Rash    Outpatient Encounter Medications as of 06/17/2021  Medication Sig   aspirin 81 MG chewable tablet Chew 1 tablet (81 mg total) by mouth daily.   calcium acetate (PHOSLO) 667 MG capsule Take by mouth 3 (three) times daily with meals.   cinacalcet (SENSIPAR) 30 MG tablet Take 30 mg by mouth daily.  clopidogrel (PLAVIX) 75 MG tablet TAKE 1 TABLET BY MOUTH EVERY DAY   denosumab (PROLIA) 60 MG/ML SOSY injection TO BE ADMINISTERED IN PHYSICIAN'S OFFICE. INJECT ONE SYRINGE SUBCOUSLY ONCE EVERY 6 MONTHS. REFRIGERATE. USE WITHIN 14 DAYS ONCE AT ROOM TEMPERATURE.   ezetimibe (ZETIA) 10 MG tablet TAKE 1 TABLET BY MOUTH EVERY DAY   pantoprazole (PROTONIX) 40 MG tablet TAKE 1 TABLET BY MOUTH EVERY DAY   PARoxetine (PAXIL) 40 MG tablet TAKE 1 TABLET BY MOUTH EVERY DAY   pravastatin (PRAVACHOL) 40 MG tablet TAKE 1 TABLET BY MOUTH EVERYDAY AT BEDTIME   sevelamer carbonate (RENVELA) 2.4 g PACK 2.4 g 3 (three) times daily with meals.   traZODone (DESYREL) 100  MG tablet TAKE 1 TABLET BY MOUTH EVERYDAY AT BEDTIME   triamcinolone cream (KENALOG) 0.1 % APPLY 1 APPLICATION TOPICALLY 3 (THREE) TIMES DAILY AS NEEDED (RASH).   No facility-administered encounter medications on file as of 06/17/2021.    Review of Systems  Constitutional:  Negative for appetite change, chills, fatigue, fever and unexpected weight change.  HENT:  Positive for dental problem and facial swelling. Negative for congestion, ear discharge, ear pain, hearing loss, nosebleeds, postnasal drip, rhinorrhea, sinus pressure, sinus pain, sneezing, sore throat, tinnitus and trouble swallowing.        Multiple missing and some broken teeth  Left facial swelling   Eyes:  Negative for pain, discharge, redness, itching and visual disturbance.  Respiratory:  Negative for cough, chest tightness, shortness of breath and wheezing.   Cardiovascular:  Negative for chest pain, palpitations and leg swelling.  Gastrointestinal:  Negative for abdominal distention, abdominal pain, blood in stool, constipation, diarrhea, nausea and vomiting.  Musculoskeletal:  Negative for myalgias, neck pain and neck stiffness.  Skin:  Negative for color change, pallor, rash and wound.  Neurological:  Negative for dizziness, speech difficulty, weakness, light-headedness, numbness and headaches.   Immunization History  Administered Date(s) Administered   Hepatitis B, adult 06/21/2017, 07/19/2017, 08/18/2017, 09/11/2019, 10/09/2019   Hepb-cpg 02/12/2020   Influenza Split 02/03/2011, 01/12/2012   Influenza Whole 01/07/2010   Influenza, High Dose Seasonal PF 01/08/2017, 01/22/2018, 01/16/2019, 01/22/2020, 01/18/2021   Influenza,inj,Quad PF,6+ Mos 01/03/2013, 02/12/2014, 01/15/2015, 01/06/2016   Influenza-Unspecified 01/23/2019   Moderna Sars-Covid-2 Vaccination 06/05/2019, 07/03/2019   PFIZER(Purple Top)SARS-COV-2 Vaccination 01/08/2020, 11/08/2020   Pneumococcal Conjugate-13 04/22/2015   Pneumococcal Polysaccharide-23  01/12/2012   Td 11/23/2008   Tdap 04/07/2020   Zoster Recombinat (Shingrix) 02/13/2018, 12/03/2019   Zoster, Live 04/24/2014   Pertinent  Health Maintenance Due  Topic Date Due   OPHTHALMOLOGY EXAM  02/01/2021   FOOT EXAM  03/03/2021   HEMOGLOBIN A1C  12/08/2021   LIPID PANEL  06/10/2022   COLONOSCOPY (Pts 45-68yrs Insurance coverage will need to be confirmed)  02/15/2031   INFLUENZA VACCINE  Completed   DEXA SCAN  Completed   Fall Risk 03/12/2020 12/03/2020 04/22/2021 06/10/2021 06/17/2021  Falls in the past year? 0 0 0 0 0  Was there an injury with Fall? 0 0 0 0 0  Was there an injury with Fall? - - - - -  Fall Risk Category Calculator 0 0 0 0 0  Fall Risk Category Low Low Low Low Low  Patient Fall Risk Level Low fall risk Low fall risk Low fall risk Low fall risk Low fall risk  Patient at Risk for Falls Due to - No Fall Risks No Fall Risks No Fall Risks No Fall Risks  Patient at Risk for Falls Due to - - - - -  Fall risk Follow up - Falls evaluation completed Falls evaluation completed Falls evaluation completed Falls evaluation completed   Functional Status Survey:    Vitals:   06/17/21 1355  BP: (!) 160/90  Pulse: 98  Resp: 18  Temp: (!) 95.6 F (35.3 C)  SpO2: 95%  Weight: 175 lb 3.2 oz (79.5 kg)  Height: 5' 4.5" (1.638 m)   Body mass index is 29.61 kg/m. Physical Exam Vitals reviewed.  Constitutional:      General: She is not in acute distress.    Appearance: Normal appearance. She is normal weight. She is not ill-appearing.  HENT:     Head: Normocephalic.     Jaw: Tenderness and swelling present.     Right Ear: Tympanic membrane, ear canal and external ear normal. There is no impacted cerumen.     Left Ear: Tympanic membrane, ear canal and external ear normal. There is no impacted cerumen.     Nose: Nose normal. No congestion or rhinorrhea.     Mouth/Throat:     Mouth: Mucous membranes are moist.     Dentition: Dental caries present.     Pharynx:  Oropharynx is clear. No oropharyngeal exudate or posterior oropharyngeal erythema.     Comments: Left facial swelling,tender to touch.Erythema noted on left premolar area.  Eyes:     General: No scleral icterus.       Right eye: No discharge.        Left eye: No discharge.     Extraocular Movements: Extraocular movements intact.     Conjunctiva/sclera: Conjunctivae normal.     Pupils: Pupils are equal, round, and reactive to light.  Neck:     Vascular: No carotid bruit.  Cardiovascular:     Rate and Rhythm: Normal rate and regular rhythm.     Pulses: Normal pulses.     Heart sounds: Normal heart sounds. No murmur heard.   No friction rub. No gallop.  Pulmonary:     Effort: Pulmonary effort is normal. No respiratory distress.     Breath sounds: Normal breath sounds. No wheezing, rhonchi or rales.  Chest:     Chest wall: No tenderness.  Abdominal:     General: Bowel sounds are normal. There is no distension.     Palpations: Abdomen is soft. There is no mass.     Tenderness: There is no abdominal tenderness. There is no right CVA tenderness, left CVA tenderness, guarding or rebound.  Musculoskeletal:     Cervical back: Normal range of motion. No rigidity or tenderness.  Lymphadenopathy:     Cervical: No cervical adenopathy.  Skin:    General: Skin is warm and dry.     Coloration: Skin is not pale.     Findings: No bruising, lesion or rash.  Neurological:     Mental Status: She is alert and oriented to person, place, and time.     Cranial Nerves: No cranial nerve deficit.     Sensory: No sensory deficit.     Motor: No weakness.     Coordination: Coordination normal.     Gait: Gait normal.  Psychiatric:        Mood and Affect: Mood normal.        Speech: Speech normal.        Behavior: Behavior normal.    Labs reviewed: No results for input(s): NA, K, CL, CO2, GLUCOSE, BUN, CREATININE, CALCIUM, MG, PHOS in the last 8760 hours. Recent Labs    06/10/21 1112  AST  19  ALT  29  BILITOT 0.4  PROT 6.6   No results for input(s): WBC, NEUTROABS, HGB, HCT, MCV, PLT in the last 8760 hours. Lab Results  Component Value Date   TSH 1.74 08/22/2019   Lab Results  Component Value Date   HGBA1C 4.6 06/10/2021   Lab Results  Component Value Date   CHOL 151 06/10/2021   HDL 47 (L) 06/10/2021   LDLCALC 68 06/10/2021   LDLDIRECT 127 (H) 07/17/2012   TRIG 272 (H) 06/10/2021   CHOLHDL 3.2 06/10/2021    Significant Diagnostic Results in last 30 days:  No results found.  Assessment/Plan 1. Odontogenic infection of jaw Erythema noted on left premolar area.gum tender touch  - amoxicillin-clavulanate (AUGMENTIN) 500-125 MG tablet; Take 1 tablet (500 mg total) by mouth 2 (two) times daily for 10 days.  Dispense: 20 tablet; Refill: 0 - acetaminophen (TYLENOL) 500 MG tablet; Take 1 tablet (500 mg total) by mouth every 6 (six) hours as needed.  Dispense: 30 tablet; Refill: 0 - cefTRIAXone (ROCEPHIN) injection 1 g  2. Cellulitis of face Left facial swelling,tender to touch.Tenderness and Erythema noted on left premolar area. - could benefit from I.V antibiotics due to high risk since she is on dialysis.Advised to go to ED but declined. PCP Dani Gobble also evaluated patient but declined to go to ED.  - amoxicillin-clavulanate (AUGMENTIN) 500-125 MG tablet; Take 1 tablet (500 mg total) by mouth 2 (two) times daily for 10 days.  Dispense: 20 tablet; Refill: 0 - acetaminophen (TYLENOL) 500 MG tablet; Take 1 tablet (500 mg total) by mouth every 6 (six) hours as needed.  Dispense: 30 tablet; Refill: 0 - cefTRIAXone (ROCEPHIN) injection 1 g - will follow up in 3 days.   Family/ staff Communication: Reviewed plan of care with patient verbalized understanding but declined to get evaluated in ED due to face cellulitis.   Labs/tests ordered: None   Next Appointment: 3 days for face swelling evaluation.   Sandrea Hughs, NP

## 2021-06-17 NOTE — Patient Instructions (Signed)
-   Apply warm compressor to left side of face at least once daily for 10 minutes  - take Extra strength tylenol 500 mg tablet one by mouth every 6 hrs as needed for pain or fever/chills   - Please go to the ED if symptoms worsen or having any difficulties breathing

## 2021-06-20 ENCOUNTER — Encounter: Payer: Self-pay | Admitting: Nurse Practitioner

## 2021-06-20 ENCOUNTER — Other Ambulatory Visit: Payer: Self-pay

## 2021-06-20 ENCOUNTER — Ambulatory Visit (INDEPENDENT_AMBULATORY_CARE_PROVIDER_SITE_OTHER): Payer: 59 | Admitting: Nurse Practitioner

## 2021-06-20 VITALS — BP 156/92 | HR 96 | Temp 97.9°F | Ht 64.5 in | Wt 176.6 lb

## 2021-06-20 DIAGNOSIS — L03211 Cellulitis of face: Secondary | ICD-10-CM | POA: Diagnosis not present

## 2021-06-20 DIAGNOSIS — K122 Cellulitis and abscess of mouth: Secondary | ICD-10-CM | POA: Diagnosis not present

## 2021-06-20 NOTE — Progress Notes (Signed)
Careteam: Patient Care Team: [name], NP as PCP - General (Nurse Practitioner) [name], MD as Consulting Physician (Ophthalmology) [name], MD as Consulting Physician (Nephrology) [name], NP as Nurse Practitioner (Nurse Practitioner)  PLACE OF SERVICE:  [facility]  Advanced Directive information    Allergies  Allergen Reactions   Lisinopril Other (See Comments)    Abnormal Kidney Function    Pioglitazone Other (See Comments)    REACTION: Desquamation of skin of the palm   Morphine Other (See Comments)   Other Nausea And Vomiting   Morphine And Related Nausea And Vomiting   Sulfonamide Derivatives Rash    Chief Complaint  Patient presents with   Acute Visit    Face swelling      HPI: Patient is a 76 y.o. female to follow up face swelling/cellutitis to left cheek She was seen on Friday 2/24 due to pain and swelling to left cheek/jaw. She was given IM rocephin and placed on augmentin twice daily. She did not want to go to the hospital. Today she is here to follow up.  Overall pain and swelling is better.  She is tolerating antibiotic  She does have purluent drainage inside her mouth  Review of Systems:  Review of Systems  Constitutional:  Positive for chills. Negative for fever and malaise/fatigue.  HENT:         Ongoing swelling to left side of face but has improved.  No trouble eating or swallowing   Gastrointestinal:  Negative for abdominal pain and diarrhea.   Past Medical History:  Diagnosis Date   Anemia    Anxiety    Arthritis    lower back and knees    Asthma    childhood   Barrett's esophagus    Chronic kidney disease    Coagulation defect (HCC)    Depression    Diabetes mellitus    Dysphagia    Eczema    End stage renal disease (HCC)    Fluid overload, unspecified    History of herpes zoster 02/2010   Recovered fully after period of acute herpetic neuralgia tx w/ gabapentin.    Hx MRSA infection  2009   Hypercalcemia    Hyperlipidemia    Hypertension    Moderate protein-calorie malnutrition (HCC)    Neuromuscular disorder ([place])    chronic pain   Personal history of colonic polyps 10/17/2010   hyperplastic   Past Surgical History:  Procedure Laterality Date   ABDOMINAL HYSTERECTOMY     unclear when   AV FISTULA PLACEMENT Left 02/13/2017   Procedure: ARTERIOVENOUS (AV) GORE-TEX STRETCH GRAFT INSERTION INTO LEFT ARM;  Surgeon: Elam Dutch, MD;  Location: [facility];  Service: Vascular;  Laterality: Left;   CATARACT EXTRACTION Bilateral    COLONOSCOPY     thigh surg     left side due to MRSA   UPPER GASTROINTESTINAL ENDOSCOPY     Social History:   reports that she has been smoking cigarettes. She has a 20.00 pack-year smoking history. She has never used smokeless tobacco. She reports current alcohol use of about 2.0 standard drinks per week. She reports that she does not use drugs.  Family History  Problem Relation Age of Onset   Hypertension Father    Kidney disease Father    Diabetes Brother    Colon cancer Neg Hx    CAD Neg Hx    Rectal cancer Neg Hx    Stomach cancer Neg Hx  Medications: Patient's Medications  New Prescriptions   No medications on file  Previous Medications   ACETAMINOPHEN (TYLENOL) 500 MG TABLET    Take 1 tablet (500 mg total) by mouth every 6 (six) hours as needed.   AMOXICILLIN-CLAVULANATE (AUGMENTIN) 500-125 MG TABLET    Take 1 tablet (500 mg total) by mouth 2 (two) times daily for 10 days.   ASPIRIN 81 MG CHEWABLE TABLET    Chew 1 tablet (81 mg total) by mouth daily.   CALCIUM ACETATE (PHOSLO) 667 MG CAPSULE    Take by mouth 3 (three) times daily with meals.   CINACALCET (SENSIPAR) 30 MG TABLET    Take 30 mg by mouth daily.   CLOPIDOGREL (PLAVIX) 75 MG TABLET    TAKE 1 TABLET BY MOUTH EVERY DAY   DENOSUMAB (PROLIA) 60 MG/ML SOSY INJECTION    TO BE ADMINISTERED IN PHYSICIAN'S OFFICE. INJECT ONE SYRINGE SUBCOUSLY ONCE EVERY 6 MONTHS.  REFRIGERATE. USE WITHIN 14 DAYS ONCE AT ROOM TEMPERATURE.   EZETIMIBE (ZETIA) 10 MG TABLET    TAKE 1 TABLET BY MOUTH EVERY DAY   PANTOPRAZOLE (PROTONIX) 40 MG TABLET    TAKE 1 TABLET BY MOUTH EVERY DAY   PAROXETINE (PAXIL) 40 MG TABLET    TAKE 1 TABLET BY MOUTH EVERY DAY   PRAVASTATIN (PRAVACHOL) 40 MG TABLET    TAKE 1 TABLET BY MOUTH EVERYDAY AT BEDTIME   SEVELAMER CARBONATE (RENVELA) 2.4 G PACK    2.4 g 3 (three) times daily with meals.   TRAZODONE (DESYREL) 100 MG TABLET    TAKE 1 TABLET BY MOUTH EVERYDAY AT BEDTIME   TRIAMCINOLONE CREAM (KENALOG) 0.1 %    APPLY 1 APPLICATION TOPICALLY 3 (THREE) TIMES DAILY AS NEEDED (RASH).  Modified Medications   No medications on file  Discontinued Medications   No medications on file    Physical Exam:  Vitals:   06/20/21 0949  BP: (!) 156/92  Pulse: 96  Temp: 97.9 F (36.6 C)  TempSrc: Temporal  SpO2: 92%  Weight: 176 lb 9.6 oz (80.1 kg)  Height: 5' 4.5" (1.638 m)   Body mass index is 29.85 kg/m. Wt Readings from Last 3 Encounters:  06/20/21 176 lb 9.6 oz (80.1 kg)  06/17/21 175 lb 3.2 oz (79.5 kg)  06/10/21 174 lb (78.9 kg)    Physical Exam HENT:     Mouth/Throat:     Mouth: Mucous membranes are moist.     Dentition: Abnormal dentition. Dental caries present.     Tongue: No lesions.     Pharynx: No pharyngeal swelling or posterior oropharyngeal erythema.     Comments: Redness noted below left side of lip, with swelling, tenderness noted. Purulent drainage noted to inside cheek on palpitation.  Cardiovascular:     Rate and Rhythm: Normal rate and regular rhythm.  Pulmonary:     Effort: Pulmonary effort is normal.     Breath sounds: Normal breath sounds.  Neurological:     Mental Status: She is alert.    Labs reviewed: Basic Metabolic Panel: No results for input(s): NA, K, CL, CO2, GLUCOSE, BUN, CREATININE, CALCIUM, MG, PHOS, TSH in the last 8760 hours. Liver Function Tests: Recent Labs    06/10/21 1112  AST 19  ALT  29  BILITOT 0.4  PROT 6.6   No results for input(s): LIPASE, AMYLASE in the last 8760 hours. No results for input(s): AMMONIA in the last 8760 hours. CBC: No results for input(s): WBC, NEUTROABS, HGB, HCT, MCV, PLT in  the last 8760 hours. Lipid Panel: Recent Labs    12/06/20 1016 06/10/21 1112  CHOL 120 151  HDL 45* 47*  LDLCALC 55 68  TRIG 113 272*  CHOLHDL 2.7 3.2   TSH: No results for input(s): TSH in the last 8760 hours. A1C: Lab Results  Component Value Date   HGBA1C 4.6 06/10/2021     Assessment/Plan 1. Abscess of internal cheek, left Improvement noted with Augmentin, to continue twice daily for 10 days.  -to use warm epsom salt soaks with compress and massage area to help with expressing drainage.  2. Cellulitis of face -ongoing, improved with Augmentin, to continue 10 day course    To follow up in 4 days, STRICT follow up precautions given if symptoms worsen to follow up immediately.  Carlos American. [place], [facility] [phone]

## 2021-06-20 NOTE — Patient Instructions (Signed)
Continue antibiotic twice daily  Warm salt water soaks with wash cloth ~20 MINS  Massage area

## 2021-06-24 ENCOUNTER — Other Ambulatory Visit: Payer: Self-pay

## 2021-06-24 ENCOUNTER — Encounter: Payer: Self-pay | Admitting: Family

## 2021-06-24 ENCOUNTER — Ambulatory Visit (INDEPENDENT_AMBULATORY_CARE_PROVIDER_SITE_OTHER): Payer: 59 | Admitting: Family

## 2021-06-24 VITALS — BP 144/78 | HR 101 | Temp 97.0°F | Resp 16 | Ht 64.5 in | Wt 175.2 lb

## 2021-06-24 DIAGNOSIS — K122 Cellulitis and abscess of mouth: Secondary | ICD-10-CM

## 2021-06-24 NOTE — Progress Notes (Signed)
Provider: Tevita Gomer FNP-C  [name], NP  Patient Care Team: [name], NP as PCP - General (Nurse Practitioner) [name], MD as Consulting Physician (Ophthalmology) [name], MD as Consulting Physician (Nephrology) [name], NP as Nurse Practitioner (Nurse Practitioner)  Extended Emergency Contact Information Primary Emergency Contact: [facility] Address: [address]          [place], [place] [address] Johnnette Litter of [place] Phone: [phone] Mobile Phone: [phone] Relation: Brother  Code Status:  DNR Goals of care: Advanced Directive information Advanced Directives 06/24/2021  Does Patient Have a Medical Advance Directive? Yes  Type of Advance Directive Out of facility DNR (pink MOST or yellow form)  Does patient want to make changes to medical advance directive? No - Patient declined  Would patient like information on creating a medical advance directive? -  Pre-existing out of facility DNR order (yellow form or pink MOST form) -     Chief Complaint  Patient presents with   Follow-up    Recheck facial swelling.     HPI:  Pt is a 76 y.o. female seen today for an acute visit for follow up facial abscess and cellulitis.she was first seen 06/17/2021 was treated I.M Rocephin and with 10 days course of Augmentin and advised to follow up in 3 days.she followed with PCP Dani Gobble.she had purulent drainage inside the mouth.she was advised to complete Augmentin and follow up today. States has two more doses of antibiotics. States pain and swelling has improved.still has small area on left jaw that is swollen but not painful.No drainage. She denies any fever or chills.    Past Medical History:  Diagnosis Date   Anemia    Anxiety    Arthritis    lower back and knees    Asthma    childhood   Barrett's esophagus    Chronic kidney disease    Coagulation defect (HCC)    Depression    Diabetes mellitus     Dysphagia    Eczema    End stage renal disease (HCC)    Fluid overload, unspecified    History of herpes zoster 02/2010   Recovered fully after period of acute herpetic neuralgia tx w/ gabapentin.    Hx MRSA infection 2009   Hypercalcemia    Hyperlipidemia    Hypertension    Moderate protein-calorie malnutrition (HCC)    Neuromuscular disorder ([place])    chronic pain   Personal history of colonic polyps 10/17/2010   hyperplastic   Past Surgical History:  Procedure Laterality Date   ABDOMINAL HYSTERECTOMY     unclear when   AV FISTULA PLACEMENT Left 02/13/2017   Procedure: ARTERIOVENOUS (AV) GORE-TEX STRETCH GRAFT INSERTION INTO LEFT ARM;  Surgeon: Elam Dutch, MD;  Location: [facility];  Service: Vascular;  Laterality: Left;   CATARACT EXTRACTION Bilateral    COLONOSCOPY     thigh surg     left side due to MRSA   UPPER GASTROINTESTINAL ENDOSCOPY      Allergies  Allergen Reactions   Lisinopril Other (See Comments)    Abnormal Kidney Function    Pioglitazone Other (See Comments)    REACTION: Desquamation of skin of the palm   Morphine Other (See Comments)   Other Nausea And Vomiting   Morphine And Related Nausea And Vomiting   Sulfonamide Derivatives Rash    Outpatient Encounter Medications as of 06/24/2021  Medication Sig   acetaminophen (TYLENOL) 500 MG tablet Take 1 tablet (  500 mg total) by mouth every 6 (six) hours as needed.   amoxicillin-clavulanate (AUGMENTIN) 500-125 MG tablet Take 1 tablet (500 mg total) by mouth 2 (two) times daily for 10 days.   aspirin 81 MG chewable tablet Chew 1 tablet (81 mg total) by mouth daily.   calcium acetate (PHOSLO) 667 MG capsule Take by mouth 3 (three) times daily with meals.   cinacalcet (SENSIPAR) 30 MG tablet Take 30 mg by mouth daily.   clopidogrel (PLAVIX) 75 MG tablet TAKE 1 TABLET BY MOUTH EVERY DAY   denosumab (PROLIA) 60 MG/ML SOSY injection TO BE ADMINISTERED IN PHYSICIAN'S OFFICE. INJECT ONE SYRINGE SUBCOUSLY ONCE EVERY 6  MONTHS. REFRIGERATE. USE WITHIN 14 DAYS ONCE AT ROOM TEMPERATURE.   ezetimibe (ZETIA) 10 MG tablet TAKE 1 TABLET BY MOUTH EVERY DAY   pantoprazole (PROTONIX) 40 MG tablet TAKE 1 TABLET BY MOUTH EVERY DAY   PARoxetine (PAXIL) 40 MG tablet TAKE 1 TABLET BY MOUTH EVERY DAY   pravastatin (PRAVACHOL) 40 MG tablet TAKE 1 TABLET BY MOUTH EVERYDAY AT BEDTIME   sevelamer carbonate (RENVELA) 2.4 g PACK 2.4 g 3 (three) times daily with meals.   traZODone (DESYREL) 100 MG tablet TAKE 1 TABLET BY MOUTH EVERYDAY AT BEDTIME   triamcinolone cream (KENALOG) 0.1 % APPLY 1 APPLICATION TOPICALLY 3 (THREE) TIMES DAILY AS NEEDED (RASH).   [DISCONTINUED] cefTRIAXone (ROCEPHIN) injection 1 g    No facility-administered encounter medications on file as of 06/24/2021.    Review of Systems  Constitutional:  Negative for appetite change, chills, fatigue, fever and unexpected weight change.  HENT:  Positive for dental problem. Negative for congestion, ear discharge, ear pain, facial swelling, hearing loss, nosebleeds, postnasal drip, rhinorrhea, sinus pressure, sinus pain, sneezing and sore throat.        Multiple broken teeth.   Eyes:  Negative for pain, discharge, redness, itching and visual disturbance.  Respiratory:  Negative for cough, chest tightness, shortness of breath and wheezing.   Cardiovascular:  Negative for chest pain, palpitations and leg swelling.  Skin:  Negative for color change, pallor, rash and wound.  Neurological:  Negative for dizziness, speech difficulty, light-headedness and headaches.   Immunization History  Administered Date(s) Administered   Hepatitis B, adult 06/21/2017, 07/19/2017, 08/18/2017, 09/11/2019, 10/09/2019   Hepb-cpg 02/12/2020   Influenza Split 02/03/2011, 01/12/2012   Influenza Whole 01/07/2010   Influenza, High Dose Seasonal PF 01/08/2017, 01/22/2018, 01/16/2019, 01/22/2020, 01/18/2021   Influenza,inj,Quad PF,6+ Mos 01/03/2013, 02/12/2014, 01/15/2015, 01/06/2016    Influenza-Unspecified 01/23/2019   Moderna Sars-Covid-2 Vaccination 06/05/2019, 07/03/2019   PFIZER(Purple Top)SARS-COV-2 Vaccination 01/08/2020, 11/08/2020   Pneumococcal Conjugate-13 04/22/2015   Pneumococcal Polysaccharide-23 01/12/2012   Td 11/23/2008   Tdap 04/07/2020   Zoster Recombinat (Shingrix) 02/13/2018, 12/03/2019   Zoster, Live 04/24/2014   Pertinent  Health Maintenance Due  Topic Date Due   OPHTHALMOLOGY EXAM  02/01/2021   FOOT EXAM  03/03/2021   HEMOGLOBIN A1C  12/08/2021   LIPID PANEL  06/10/2022   COLONOSCOPY (Pts 45-29yrs Insurance coverage will need to be confirmed)  02/15/2031   INFLUENZA VACCINE  Completed   DEXA SCAN  Completed   Fall Risk 12/03/2020 04/22/2021 06/10/2021 06/17/2021 06/24/2021  Falls in the past year? 0 0 0 0 0  Was there an injury with Fall? 0 0 0 0 0  Was there an injury with Fall? - - - - -  Fall Risk Category Calculator 0 0 0 0 0  Fall Risk Category Low Low Low Low Low  Patient  Fall Risk Level Low fall risk Low fall risk Low fall risk Low fall risk Low fall risk  Patient at Risk for Falls Due to No Fall Risks No Fall Risks No Fall Risks No Fall Risks No Fall Risks  Patient at Risk for Falls Due to - - - - -  Fall risk Follow up Falls evaluation completed Falls evaluation completed Falls evaluation completed Falls evaluation completed Falls evaluation completed   Functional Status Survey:    Vitals:   06/24/21 1029  BP: (!) 144/78  Pulse: (!) 101  Resp: 16  Temp: (!) 97 F (36.1 C)  SpO2: 96%  Weight: 175 lb 3.2 oz (79.5 kg)  Height: 5' 4.5" (1.638 m)   Body mass index is 29.61 kg/m. Physical Exam Vitals reviewed.  Constitutional:      General: She is not in acute distress.    Appearance: Normal appearance. She is overweight. She is not ill-appearing.  HENT:     Head: Normocephalic.     Mouth/Throat:     Mouth: Mucous membranes are moist.     Dentition: Dental caries present.     Pharynx: Oropharynx is clear. No  oropharyngeal exudate or posterior oropharyngeal erythema.     Comments: Left facial swelling and abscess has improved.No erythema or drainage noted.No tender to palpation.  Eyes:     General: No scleral icterus.       Right eye: No discharge.        Left eye: No discharge.     Conjunctiva/sclera: Conjunctivae normal.     Pupils: Pupils are equal, round, and reactive to light.  Neck:     Vascular: No carotid bruit.  Cardiovascular:     Rate and Rhythm: Normal rate and regular rhythm.     Pulses: Normal pulses.     Heart sounds: Normal heart sounds. No murmur heard.   No friction rub. No gallop.  Pulmonary:     Effort: Pulmonary effort is normal. No respiratory distress.     Breath sounds: Normal breath sounds. No wheezing, rhonchi or rales.  Chest:     Chest wall: No tenderness.  Musculoskeletal:     Cervical back: Normal range of motion. No rigidity or tenderness.  Lymphadenopathy:     Cervical: No cervical adenopathy.  Skin:    General: Skin is warm and dry.     Coloration: Skin is not pale.     Findings: No bruising, erythema, lesion or rash.  Neurological:     Mental Status: She is alert and oriented to person, place, and time.     Motor: No weakness.     Gait: Gait normal.  Psychiatric:        Mood and Affect: Mood normal.        Speech: Speech normal.        Behavior: Behavior normal.    Labs reviewed: Recent Labs    04/24/21 0000 05/25/21 0000  K 4.2 3.8  CALCIUM 9.4 9.4   Recent Labs    04/24/21 0000 05/25/21 0000 06/10/21 1112  AST  --   --  19  ALT  --   --  29  BILITOT  --   --  0.4  PROT  --   --  6.6  ALBUMIN 3.9 4.2  --    Recent Labs    04/24/21 0000 05/25/21 0000  HGB 10.1* 10.0*   Lab Results  Component Value Date   TSH 1.74 08/22/2019   Lab Results  Component  Value Date   HGBA1C 4.6 06/10/2021   Lab Results  Component Value Date   CHOL 151 06/10/2021   HDL 47 (L) 06/10/2021   LDLCALC 68 06/10/2021   LDLDIRECT 127 (H)  07/17/2012   TRIG 272 (H) 06/10/2021   CHOLHDL 3.2 06/10/2021    Significant Diagnostic Results in last 30 days:  No results found.  Assessment/Plan   Abscess of internal cheek, left Afebrile  Left facial swelling and abscess has improved.No erythema or drainage noted.No tender to palpation.  - advised to complete current course of Augmentin - Advised to follow up with dentist for evaluation of dental carries/cavities  Dental information provided or may find other alternative  [facility] at [phone]  Address: [address] [place] [address]  - advised to notify provider if symptoms recurs or worsen.    Family/ staff Communication: Reviewed plan of care with patient verbalized understanding   Labs/tests ordered: None   Next Appointment:As needed if symptoms worsen or fail to improve     Sandrea Hughs, NP

## 2021-06-24 NOTE — Patient Instructions (Signed)
-   please schedule appointment with a dentist to evaluate dental cavities  ?[facility] at [phone]  ?Address: [address] ?[place] [address]  ? ?- Notify provider if symptoms worsen or fail to resolve after completing Augmentin  ? ? ?

## 2021-06-28 ENCOUNTER — Encounter: Payer: Self-pay | Admitting: Nurse Practitioner

## 2021-06-28 ENCOUNTER — Other Ambulatory Visit: Payer: Self-pay

## 2021-06-28 ENCOUNTER — Ambulatory Visit (INDEPENDENT_AMBULATORY_CARE_PROVIDER_SITE_OTHER): Payer: 59 | Admitting: Nurse Practitioner

## 2021-06-28 DIAGNOSIS — Z0000 Encounter for general adult medical examination without abnormal findings: Secondary | ICD-10-CM

## 2021-06-28 DIAGNOSIS — E2839 Other primary ovarian failure: Secondary | ICD-10-CM

## 2021-06-28 NOTE — Progress Notes (Signed)
This service is provided via telemedicine ? ?No vital signs collected/recorded due to the encounter was a telemedicine visit.  ? ?Location of patient (ex: home, work):  Home ? ?Patient consents to a telephone visit:  Yes, see encounter dated 06/28/2021 ? ?Location of the provider (ex: office, home):  [place] ? ?Name of any referring provider:  N/A ? ?Names of all persons participating in the telemedicine service and their role in the encounter:  [name], Nurse Practitioner, Carroll Kinds, CMA, and patient.  ? ?Time spent on call:  9 minutes with medical assistant ? ?

## 2021-06-28 NOTE — Patient Instructions (Signed)
[name] , Thank you for taking time to come for your Medicare Wellness Visit. I appreciate your ongoing commitment to your health goals. Please review the following plan we discussed and let me know if I can assist you in the future.   Screening recommendations/referrals: Colonoscopy up to date Mammogram up to date Bone Density DUE in [place] Recommended yearly ophthalmology/optometry visit for glaucoma screening and checkup Recommended yearly dental visit for hygiene and checkup  Vaccinations: Influenza vaccine up to date Pneumococcal vaccine up to date Tdap vaccine up to date Shingles vaccine up to date    Advanced directives: on file.   Conditions/risks identified: smoker, advantage age, diabetes  Next appointment: yearly    Preventive Care 30 Years and Older, Female Preventive care refers to lifestyle choices and visits with your health care provider that can promote health and wellness. What does preventive care include? A yearly physical exam. This is also called an annual well check. Dental exams once or twice a year. Routine eye exams. Ask your health care provider how often you should have your eyes checked. Personal lifestyle choices, including: Daily care of your teeth and gums. Regular physical activity. Eating a healthy diet. Avoiding tobacco and drug use. Limiting alcohol use. Practicing safe sex. Taking low-dose aspirin every day. Taking vitamin and mineral supplements as recommended by your health care provider. What happens during an annual well check? The services and screenings done by your health care provider during your annual well check will depend on your age, overall health, lifestyle risk factors, and family history of disease. Counseling  Your health care provider may ask you questions about your: Alcohol use. Tobacco use. Drug use. Emotional well-being. Home and relationship well-being. Sexual activity. Eating habits. History of  falls. Memory and ability to understand (cognition). Work and work Statistician. Reproductive health. Screening  You may have the following tests or measurements: Height, weight, and BMI. Blood pressure. Lipid and cholesterol levels. These may be checked every 5 years, or more frequently if you are over 54 years old. Skin check. Lung cancer screening. You may have this screening every year starting at age 29 if you have a 30-pack-year history of smoking and currently smoke or have quit within the past 15 years. Fecal occult blood test (FOBT) of the stool. You may have this test every year starting at age 13. Flexible sigmoidoscopy or colonoscopy. You may have a sigmoidoscopy every 5 years or a colonoscopy every 10 years starting at age 2. Hepatitis C blood test. Hepatitis B blood test. Sexually transmitted disease (STD) testing. Diabetes screening. This is done by checking your blood sugar (glucose) after you have not eaten for a while (fasting). You may have this done every 1-3 years. Bone density scan. This is done to screen for osteoporosis. You may have this done starting at age 56. Mammogram. This may be done every 1-2 years. Talk to your health care provider about how often you should have regular mammograms. Talk with your health care provider about your test results, treatment options, and if necessary, the need for more tests. Vaccines  Your health care provider may recommend certain vaccines, such as: Influenza vaccine. This is recommended every year. Tetanus, diphtheria, and acellular pertussis (Tdap, Td) vaccine. You may need a Td booster every 10 years. Zoster vaccine. You may need this after age 65. Pneumococcal 13-valent conjugate (PCV13) vaccine. One dose is recommended after age 60. Pneumococcal polysaccharide (PPSV23) vaccine. One dose is recommended after age 42. Talk to your  health care provider about which screenings and vaccines you need and how often you need  them. This information is not intended to replace advice given to you by your health care provider. Make sure you discuss any questions you have with your health care provider. Document Released: 05/07/2015 Document Revised: 12/29/2015 Document Reviewed: 02/09/2015 Elsevier Interactive Patient Education  2017 [place] Prevention in the Home Falls can cause injuries. They can happen to people of all ages. There are many things you can do to make your home safe and to help prevent falls. What can I do on the outside of my home? Regularly fix the edges of walkways and driveways and fix any cracks. Remove anything that might make you trip as you walk through a door, such as a raised step or threshold. Trim any bushes or trees on the path to your home. Use bright outdoor lighting. Clear any walking paths of anything that might make someone trip, such as rocks or tools. Regularly check to see if handrails are loose or broken. Make sure that both sides of any steps have handrails. Any raised decks and porches should have guardrails on the edges. Have any leaves, snow, or ice cleared regularly. Use sand or salt on walking paths during winter. Clean up any spills in your garage right away. This includes oil or grease spills. What can I do in the bathroom? Use night lights. Install grab bars by the toilet and in the tub and shower. Do not use towel bars as grab bars. Use non-skid mats or decals in the tub or shower. If you need to sit down in the shower, use a plastic, non-slip stool. Keep the floor dry. Clean up any water that spills on the floor as soon as it happens. Remove soap buildup in the tub or shower regularly. Attach bath mats securely with double-sided non-slip rug tape. Do not have throw rugs and other things on the floor that can make you trip. What can I do in the bedroom? Use night lights. Make sure that you have a light by your bed that is easy to reach. Do not use  any sheets or blankets that are too big for your bed. They should not hang down onto the floor. Have a firm chair that has side arms. You can use this for support while you get dressed. Do not have throw rugs and other things on the floor that can make you trip. What can I do in the kitchen? Clean up any spills right away. Avoid walking on wet floors. Keep items that you use a lot in easy-to-reach places. If you need to reach something above you, use a strong step stool that has a grab bar. Keep electrical cords out of the way. Do not use floor polish or wax that makes floors slippery. If you must use wax, use non-skid floor wax. Do not have throw rugs and other things on the floor that can make you trip. What can I do with my stairs? Do not leave any items on the stairs. Make sure that there are handrails on both sides of the stairs and use them. Fix handrails that are broken or loose. Make sure that handrails are as long as the stairways. Check any carpeting to make sure that it is firmly attached to the stairs. Fix any carpet that is loose or worn. Avoid having throw rugs at the top or bottom of the stairs. If you do have throw rugs, attach them  to the floor with carpet tape. Make sure that you have a light switch at the top of the stairs and the bottom of the stairs. If you do not have them, ask someone to add them for you. What else can I do to help prevent falls? Wear shoes that: Do not have high heels. Have rubber bottoms. Are comfortable and fit you well. Are closed at the toe. Do not wear sandals. If you use a stepladder: Make sure that it is fully opened. Do not climb a closed stepladder. Make sure that both sides of the stepladder are locked into place. Ask someone to hold it for you, if possible. Clearly mark and make sure that you can see: Any grab bars or handrails. First and last steps. Where the edge of each step is. Use tools that help you move around (mobility aids)  if they are needed. These include: Canes. Walkers. Scooters. Crutches. Turn on the lights when you go into a dark area. Replace any light bulbs as soon as they burn out. Set up your furniture so you have a clear path. Avoid moving your furniture around. If any of your floors are uneven, fix them. If there are any pets around you, be aware of where they are. Review your medicines with your doctor. Some medicines can make you feel dizzy. This can increase your chance of falling. Ask your doctor what other things that you can do to help prevent falls. This information is not intended to replace advice given to you by your health care provider. Make sure you discuss any questions you have with your health care provider. Document Released: 02/04/2009 Document Revised: 09/16/2015 Document Reviewed: 05/15/2014 Elsevier Interactive Patient Education  2017 [employer].

## 2021-06-28 NOTE — Progress Notes (Signed)
Subjective:   [name] is a 76 y.o. female who presents for Medicare Annual (Subsequent) preventive examination.  Review of Systems     Cardiac Risk Factors include: sedentary lifestyle;diabetes mellitus;smoking/ tobacco exposure;hypertension;advanced age (>53mn, >>83women)     Objective:    There were no vitals filed for this visit. There is no height or weight on file to calculate BMI.  Advanced Directives 06/28/2021 06/24/2021 06/17/2021 06/10/2021 04/22/2021 12/03/2020 05/21/2020  Does Patient Have a Medical Advance Directive? Yes Yes Yes Yes Yes Yes Yes  Type of Advance Directive Out of facility DNR (pink MOST or yellow form) Out of facility DNR (pink MOST or yellow form) Out of facility DNR (pink MOST or yellow form) Out of facility DNR (pink MOST or yellow form) Out of facility DNR (pink MOST or yellow form) Out of facility DNR (pink MOST or yellow form) Out of facility DNR (pink MOST or yellow form)  Does patient want to make changes to medical advance directive? No - Patient declined No - Patient declined No - Patient declined No - Patient declined No - Patient declined No - Patient declined No - Patient declined  Would patient like information on creating a medical advance directive? - - - - - - -  Pre-existing out of facility DNR order (yellow form or pink MOST form) Yellow form placed in chart (order not valid for inpatient use) - - Yellow form placed in chart (order not valid for inpatient use) - Yellow form placed in chart (order not valid for inpatient use) Yellow form placed in chart (order not valid for inpatient use)    Current Medications (verified) Outpatient Encounter Medications as of 06/28/2021  Medication Sig   acetaminophen (TYLENOL) 500 MG tablet Take 1 tablet (500 mg total) by mouth every 6 (six) hours as needed.   aspirin 81 MG chewable tablet Chew 1 tablet (81 mg total) by mouth daily.   calcium acetate (PHOSLO) 667 MG capsule Take by mouth 3 (three) times  daily with meals.   cinacalcet (SENSIPAR) 30 MG tablet Take 30 mg by mouth daily.   clopidogrel (PLAVIX) 75 MG tablet TAKE 1 TABLET BY MOUTH EVERY DAY   denosumab (PROLIA) 60 MG/ML SOSY injection TO BE ADMINISTERED IN PHYSICIAN'S OFFICE. INJECT ONE SYRINGE SUBCOUSLY ONCE EVERY 6 MONTHS. REFRIGERATE. USE WITHIN 14 DAYS ONCE AT ROOM TEMPERATURE.   ezetimibe (ZETIA) 10 MG tablet TAKE 1 TABLET BY MOUTH EVERY DAY   pantoprazole (PROTONIX) 40 MG tablet TAKE 1 TABLET BY MOUTH EVERY DAY   PARoxetine (PAXIL) 40 MG tablet TAKE 1 TABLET BY MOUTH EVERY DAY   pravastatin (PRAVACHOL) 40 MG tablet TAKE 1 TABLET BY MOUTH EVERYDAY AT BEDTIME   sevelamer carbonate (RENVELA) 2.4 g PACK 2.4 g 3 (three) times daily with meals.   traZODone (DESYREL) 100 MG tablet TAKE 1 TABLET BY MOUTH EVERYDAY AT BEDTIME   triamcinolone cream (KENALOG) 0.1 % APPLY 1 APPLICATION TOPICALLY 3 (THREE) TIMES DAILY AS NEEDED (RASH).   No facility-administered encounter medications on file as of 06/28/2021.    Allergies (verified) Lisinopril, Pioglitazone, Morphine, Other, Morphine and related, and Sulfonamide derivatives   History: Past Medical History:  Diagnosis Date   Anemia    Anxiety    Arthritis    lower back and knees    Asthma    childhood   Barrett's esophagus    Chronic kidney disease    Coagulation defect ([place]    Depression    Diabetes mellitus  Dysphagia    Eczema    End stage renal disease (HCC)    Fluid overload, unspecified    History of herpes zoster 02/2010   Recovered fully after period of acute herpetic neuralgia tx w/ gabapentin.    Hx MRSA infection 2009   Hypercalcemia    Hyperlipidemia    Hypertension    Moderate protein-calorie malnutrition (HCC)    Neuromuscular disorder (HCC)    chronic pain   Personal history of colonic polyps 10/17/2010   hyperplastic   Past Surgical History:  Procedure Laterality Date   ABDOMINAL HYSTERECTOMY     unclear when   AV FISTULA PLACEMENT Left  02/13/2017   Procedure: ARTERIOVENOUS (AV) GORE-TEX STRETCH GRAFT INSERTION INTO LEFT ARM;  Surgeon: Elam Dutch, MD;  Location: [facility] OR;  Service: Vascular;  Laterality: Left;   CATARACT EXTRACTION Bilateral    COLONOSCOPY     thigh surg     left side due to MRSA   UPPER GASTROINTESTINAL ENDOSCOPY     Family History  Problem Relation Age of Onset   Hypertension Father    Kidney disease Father    Diabetes Brother    Colon cancer Neg Hx    CAD Neg Hx    Rectal cancer Neg Hx    Stomach cancer Neg Hx    Social History   Socioeconomic History   Marital status: Divorced    Spouse name: Not on file   Number of children: 0   Years of education: Not on file   Highest education level: Not on file  Occupational History   Occupation: Retired    Fish farm manager: SELF-EMPLOYED  Tobacco Use   Smoking status: Every Day    Packs/day: 0.50    Years: 40.00    Pack years: 20.00    Types: Cigarettes   Smokeless tobacco: Never   Tobacco comments:    smokes 1 pack of cigerettes a week- off and on  Vaping Use   Vaping Use: Never used  Substance and Sexual Activity   Alcohol use: Yes    Alcohol/week: 2.0 standard drinks    Types: 2 Standard drinks or equivalent per week    Comment: wine and beer- occasionally    Drug use: No   Sexual activity: Never  Other Topics Concern   Not on file  Social History Narrative   Financial assistance approved for 100% discount at [facility] and has [facility] card per [employer]   02/10/2010      3 caffeine drinks daily    Social Determinants of Health   Financial Resource Strain: Not on file  Food Insecurity: Not on file  Transportation Needs: Not on file  Physical Activity: Not on file  Stress: Not on file  Social Connections: Not on file    Tobacco Counseling Ready to quit: Not Answered Counseling given: Not Answered Tobacco comments: smokes 1 pack of cigerettes a week- off and on   Clinical Intake:  Pre-visit preparation completed: Yes  Pain  : No/denies pain     BMI - recorded: 29 Nutritional Status: BMI 25 -29 Overweight Nutritional Risks: Other (Comment) Diabetes: Yes  How often do you need to have someone help you when you read instructions, pamphlets, or other written materials from your doctor or pharmacy?: 3 - Sometimes  Diabetic?yes         Activities of Daily Living In your present state of health, do you have any difficulty performing the following activities: 06/28/2021  Hearing? N  Vision? N  Difficulty concentrating or making decisions? Y  Walking or climbing stairs? N  Dressing or bathing? N  Doing errands, shopping? N  Preparing Food and eating ? N  Using the Toilet? N  In the past six months, have you accidently leaked urine? N  Do you have problems with loss of bowel control? N  Managing your Medications? Y  Comment brother helps  Managing your Finances? Y  Comment has help  Housekeeping or managing your Housekeeping? N  Some recent data might be hidden    Patient Care Team: [name], NP as PCP - General (Nurse Practitioner) [name], MD as Consulting Physician (Ophthalmology) [name], MD as Consulting Physician (Nephrology) [name], NP as Nurse Practitioner (Nurse Practitioner)  Indicate any recent Medical Services you may have received from other than [facility] providers in the past year (date may be approximate).     Assessment:   This is a routine wellness examination for [place].  Hearing/Vision screen Hearing Screening - Comments:: Patient has no hearing problems Vision Screening - Comments:: Patient wears glasses. Patient had eye appointment Monday. Patient sees Dr. [name] and Dr. Posey Pronto.  Dietary issues and exercise activities discussed: Current Exercise Habits: The patient does not participate in regular exercise at present   Goals Addressed   None    Depression Screen PHQ 2/9 Scores 06/28/2021 06/10/2021 03/12/2020 08/22/2019 01/30/2018  01/08/2017 06/07/2016  PHQ - 2 Score 0 0 0 0 1 2 0  PHQ- 9 Score - - - - - 5 -    Fall Risk Fall Risk  06/28/2021 06/24/2021 06/17/2021 06/10/2021 04/22/2021  Falls in the past year? 0 0 0 0 0  Number falls in past yr: 0 0 0 0 0  Injury with Fall? 0 0 0 0 0  Comment - - - - -  Risk Factor Category  - - - - -  Risk for fall due to : No Fall Risks No Fall Risks No Fall Risks No Fall Risks No Fall Risks  Risk for fall due to: Comment - - - - -  Follow up Falls evaluation completed Falls evaluation completed Falls evaluation completed Falls evaluation completed Falls evaluation completed    FALL RISK PREVENTION PERTAINING TO THE HOME:  Any stairs in or around the home? No  If so, are there any without handrails? No  Home free of loose throw rugs in walkways, pet beds, electrical cords, etc? Yes  Adequate lighting in your home to reduce risk of falls? Yes   ASSISTIVE DEVICES UTILIZED TO PREVENT FALLS:  Life alert? Yes  Use of a cane, walker or w/c? Yes  Grab bars in the bathroom? Yes  Shower chair or bench in shower? No  Elevated toilet seat or a handicapped toilet? No   TIMED UP AND GO:  Was the test performed? No .    Cognitive Function: MMSE - Mini Mental State Exam 03/07/2019 01/30/2018 01/08/2017 12/13/2016 05/23/2016  Orientation to time '5 5 5 5 5  '$ Orientation to Place '5 5 5 5 5  '$ Registration '3 3 3 3 3  '$ Attention/ Calculation '1 5 5 5 5  '$ Recall '2 2 1 1 2  '$ Language- name 2 objects '2 2 2 2 2  '$ Language- repeat '1 1 1 1 1  '$ Language- follow 3 step command '3 3 3 3 3  '$ Language- read & follow direction '1 1 1 1 1  '$ Write a sentence 0 '1 1 1 1  '$ Copy design 0 0  0 1 1  Total score '23 28 27 28 29     '$ 6CIT Screen 06/28/2021 03/12/2020  What Year? 0 points 0 points  What month? 0 points 0 points  What time? 0 points 0 points  Count back from 20 0 points 0 points  Months in reverse 2 points 4 points  Repeat phrase 6 points 4 points  Total Score 8 8    Immunizations Immunization History   Administered Date(s) Administered   Hepatitis B, adult 06/21/2017, 07/19/2017, 08/18/2017, 09/11/2019, 10/09/2019   Hepb-cpg 02/12/2020   Influenza Split 02/03/2011, 01/12/2012   Influenza Whole 01/07/2010   Influenza, High Dose Seasonal PF 01/08/2017, 01/22/2018, 01/16/2019, 01/22/2020, 01/18/2021   Influenza,inj,Quad PF,6+ Mos 01/03/2013, 02/12/2014, 01/15/2015, 01/06/2016   Influenza-Unspecified 01/23/2019   Moderna Sars-Covid-2 Vaccination 06/05/2019, 07/03/2019   PFIZER(Purple Top)SARS-COV-2 Vaccination 01/08/2020, 11/08/2020   Pneumococcal Conjugate-13 04/22/2015   Pneumococcal Polysaccharide-23 01/12/2012   Td 11/23/2008   Tdap 04/07/2020   Zoster Recombinat (Shingrix) 02/13/2018, 12/03/2019   Zoster, Live 04/24/2014    TDAP status: Up to date  Flu Vaccine status: Up to date  Pneumococcal vaccine status: Up to date  Covid-19 vaccine status: Information provided on how to obtain vaccines.   Qualifies for Shingles Vaccine? Yes   Zostavax completed Yes   Shingrix Completed?: Yes  Screening Tests Health Maintenance  Topic Date Due   COVID-19 Vaccine (5 - Booster) 01/03/2021   OPHTHALMOLOGY EXAM  02/01/2021   FOOT EXAM  03/03/2021   HEMOGLOBIN A1C  12/08/2021   LIPID PANEL  06/10/2022   TETANUS/TDAP  04/07/2030   COLONOSCOPY (Pts 45-61yr Insurance coverage will need to be confirmed)  02/15/2031   Pneumonia Vaccine 76 Years old  Completed   INFLUENZA VACCINE  Completed   DEXA SCAN  Completed   Hepatitis C Screening  Completed   Zoster Vaccines- Shingrix  Completed   HPV VACCINES  Aged Out    Health Maintenance  Health Maintenance Due  Topic Date Due   COVID-19 Vaccine (5 - Booster) 01/03/2021   OPHTHALMOLOGY EXAM  02/01/2021   FOOT EXAM  03/03/2021    Colorectal cancer screening: No longer required.   Mammogram status: No longer required due to age.  Bone Density status: Completed 10/10/2019. Results reflect: Bone density results: OSTEOPOROSIS. Repeat  every 2 years.  Lung Cancer Screening: (Low Dose CT Chest recommended if Age 76-80years, 30 pack-year currently smoking OR have quit w/in 15years.) does qualify.   Lung Cancer Screening Referral: completed   Additional Screening:  Hepatitis C Screening: does qualify; Completed   Vision Screening: Recommended annual ophthalmology exams for early detection of glaucoma and other disorders of the eye. Is the patient up to date with their annual eye exam?  Yes  Who is the provider or what is the name of the office in which the patient attends annual eye exams? [facility] If pt is not established with a provider, would they like to be referred to a provider to establish care? No .   Dental Screening: Recommended annual dental exams for proper oral hygiene  Community Resource Referral / Chronic Care Management: CRR required this visit?  No   CCM required this visit?  No      Plan:     I have personally reviewed and noted the following in the patients chart:   Medical and social history Use of alcohol, tobacco or illicit drugs  Current medications and supplements including opioid prescriptions.  Functional ability and status Nutritional status Physical activity Advanced  directives List of other physicians Hospitalizations, surgeries, and ER visits in previous 12 months Vitals Screenings to include cognitive, depression, and falls Referrals and appointments  In addition, I have reviewed and discussed with patient certain preventive protocols, quality metrics, and best practice recommendations. A written personalized care plan for preventive services as well as general preventive health recommendations were provided to patient.     [name], NP   06/28/2021    Virtual Visit via Telephone Note  I connected with patient 06/28/21 at  3:15 PM EST by telephone and verified that I am speaking with the correct person using two identifiers.  Location: Patient: home Provider:  [place]   I discussed the limitations, risks, security and privacy concerns of performing an evaluation and management service by telephone and the availability of in person appointments. I also discussed with the patient that there may be a patient responsible charge related to this service. The patient expressed understanding and agreed to proceed.   I discussed the assessment and treatment plan with the patient. The patient was provided an opportunity to ask questions and all were answered. The patient agreed with the plan and demonstrated an understanding of the instructions.   The patient was advised to call back or seek an in-person evaluation if the symptoms worsen or if the condition fails to improve as anticipated.  I provided 15 minutes of non-face-to-face time during this encounter.  Carlos American. Harle Battiest Avs printed and mailed

## 2021-07-11 ENCOUNTER — Ambulatory Visit: Payer: 59 | Admitting: Family

## 2021-07-11 ENCOUNTER — Other Ambulatory Visit: Payer: Self-pay

## 2021-07-15 ENCOUNTER — Ambulatory Visit (INDEPENDENT_AMBULATORY_CARE_PROVIDER_SITE_OTHER): Payer: 59 | Admitting: Family

## 2021-07-15 ENCOUNTER — Encounter: Payer: Self-pay | Admitting: Family

## 2021-07-15 ENCOUNTER — Other Ambulatory Visit: Payer: Self-pay

## 2021-07-15 VITALS — BP 140/88 | HR 91 | Temp 97.1°F | Resp 16 | Ht 64.5 in | Wt 176.2 lb

## 2021-07-15 DIAGNOSIS — M272 Inflammatory conditions of jaws: Secondary | ICD-10-CM

## 2021-07-15 MED ORDER — AMOXICILLIN-POT CLAVULANATE 875-125 MG PO TABS: 1.0000 | ORAL_TABLET | Freq: Two times a day (BID) | ORAL | 0 refills | Status: DC

## 2021-07-15 NOTE — Patient Instructions (Signed)
-   Please insurance to check which Dentist is on your plan to get broken teeth removed. ? ?- Tylenol as needed for pain  ? ?- Apply warm compressor to left jaw at least once daily for 20 minutes  ? ?- Notify provider if symptoms worsen or fail to improve  ? ?

## 2021-07-15 NOTE — Progress Notes (Signed)
? ?Provider: Marlowe Sax FNP-C ? ?[name], NP ? ?Patient Care Team: ?[name], NP as PCP - General (Nurse Practitioner) ?[name], MD as Consulting Physician (Ophthalmology) ?[name], MD as Consulting Physician (Nephrology) ?[name], NP as Nurse Practitioner (Nurse Practitioner) ? ?Extended Emergency Contact Information ?Primary Emergency Contact: [place] ?Address: [address] ?         [place], [place] [address] [place] ?Home Phone: [phone] ?Mobile Phone: [phone] ?Relation: Brother ? ?Code Status:  DNR ?Goals of care: Advanced Directive information ? ?  07/15/2021  ?  8:24 AM  ?Advanced Directives  ?Does Patient Have a Medical Advance Directive? Yes  ?Type of Advance Directive Out of facility DNR (pink MOST or yellow form)  ?Does patient want to make changes to medical advance directive? No - Patient declined  ? ? ? ?Chief Complaint  ?Patient presents with  ? Acute Visit  ?  Patient complains of bump on left bottom lip. Patient noticed bump when she came into office last time for facial swelling.  ? ? ?HPI:  ?Pt is a 76 y.o. female seen today for an acute visit for evaluation of left jaw abscess.  States previous upset is sites is swollen on exam.  She denies any fever chills or drainage.  Of note, she was here 2/ 24/2023,06/20/2021 and 06/24/2021 for left face abscess due to infected teeth.  She has multiple teeth decay. Advised to follow up with the dentist [place] address and telephone was given on the previous visit.  But she states they do not take [employer].  I have discussed with her to call her insurance to provide her with dentist that within her network. ? ? ?Past Medical History:  ?Diagnosis Date  ? Anemia   ? Anxiety   ? Arthritis   ? lower back and knees   ? Asthma   ? childhood  ? Barrett's esophagus   ? Chronic kidney disease   ? Coagulation defect ([place])   ? Depression   ? Diabetes mellitus   ?  Dysphagia   ? Eczema   ? End stage renal disease ([place])   ? Fluid overload, unspecified   ? History of herpes zoster 02/2010  ? Recovered fully after period of acute herpetic neuralgia tx w/ gabapentin.   ? Hx MRSA infection 2009  ? Hypercalcemia   ? Hyperlipidemia   ? Hypertension   ? Moderate protein-calorie malnutrition ([place])   ? Neuromuscular disorder ([place])   ? chronic pain  ? Personal history of colonic polyps 10/17/2010  ? hyperplastic  ? ?Past Surgical History:  ?Procedure Laterality Date  ? ABDOMINAL HYSTERECTOMY    ? unclear when  ? AV FISTULA PLACEMENT Left 02/13/2017  ? Procedure: ARTERIOVENOUS (AV) GORE-TEX STRETCH GRAFT INSERTION INTO LEFT ARM;  Surgeon: Elam Dutch, MD;  Location: [facility] OR;  Service: Vascular;  Laterality: Left;  ? CATARACT EXTRACTION Bilateral   ? COLONOSCOPY    ? thigh surg    ? left side due to MRSA  ? UPPER GASTROINTESTINAL ENDOSCOPY    ? ? ?Allergies  ?Allergen Reactions  ? Lisinopril Other (See Comments)  ?  Abnormal Kidney Function   ? Pioglitazone Other (See Comments)  ?  REACTION: Desquamation of skin of the palm  ? Morphine Other (See Comments)  ? Other Nausea And Vomiting  ? Morphine And Related Nausea And Vomiting  ? Sulfonamide Derivatives Rash  ? ? ?Outpatient Encounter Medications as  of 07/15/2021  ?Medication Sig  ? acetaminophen (TYLENOL) 500 MG tablet Take 1 tablet (500 mg total) by mouth every 6 (six) hours as needed.  ? aspirin 81 MG chewable tablet Chew 1 tablet (81 mg total) by mouth daily.  ? B Complex-C-Zn-Folic Acid (DIALYVITE 704 WITH ZINC) 0.8 MG TABS Take 1 tablet by mouth Every Tuesday,Thursday,and Saturday with dialysis.  ? calcium acetate (PHOSLO) 667 MG capsule Take by mouth 3 (three) times daily with meals.  ? cinacalcet (SENSIPAR) 30 MG tablet Take 30 mg by mouth daily.  ? clopidogrel (PLAVIX) 75 MG tablet TAKE 1 TABLET BY MOUTH EVERY DAY  ? denosumab (PROLIA) 60 MG/ML SOSY injection TO BE ADMINISTERED IN PHYSICIAN'S OFFICE. INJECT ONE SYRINGE  SUBCOUSLY ONCE EVERY 6 MONTHS. REFRIGERATE. USE WITHIN 14 DAYS ONCE AT ROOM TEMPERATURE.  ? ezetimibe (ZETIA) 10 MG tablet TAKE 1 TABLET BY MOUTH EVERY DAY  ? Methoxy PEG-Epoetin Beta (MIRCERA IJ) Inject 75 mcg as directed every 14 (fourteen) days.  ? pantoprazole (PROTONIX) 40 MG tablet TAKE 1 TABLET BY MOUTH EVERY DAY  ? PARoxetine (PAXIL) 40 MG tablet TAKE 1 TABLET BY MOUTH EVERY DAY  ? pravastatin (PRAVACHOL) 40 MG tablet TAKE 1 TABLET BY MOUTH EVERYDAY AT BEDTIME  ? sevelamer carbonate (RENVELA) 2.4 g PACK 2.4 g 3 (three) times daily with meals.  ? traZODone (DESYREL) 100 MG tablet TAKE 1 TABLET BY MOUTH EVERYDAY AT BEDTIME  ? triamcinolone cream (KENALOG) 0.1 % APPLY 1 APPLICATION TOPICALLY 3 (THREE) TIMES DAILY AS NEEDED (RASH).  ? ?No facility-administered encounter medications on file as of 07/15/2021.  ? ? ?Review of Systems  ?Constitutional:  Negative for chills, fatigue and fever.  ?HENT:  Positive for dental problem. Negative for congestion, facial swelling, rhinorrhea, sinus pressure, sinus pain, sneezing and sore throat.   ?Respiratory:  Negative for cough, chest tightness, shortness of breath and wheezing.   ?Skin:  Negative for color change, pallor, rash and wound.  ?Neurological:  Negative for dizziness, light-headedness and headaches.  ? ?Immunization History  ?Administered Date(s) Administered  ? Hepatitis B, adult 06/21/2017, 07/19/2017, 08/18/2017, 09/11/2019, 10/09/2019  ? Hepb-cpg 02/12/2020  ? Influenza Split 02/03/2011, 01/12/2012  ? Influenza Whole 01/07/2010  ? Influenza, High Dose Seasonal PF 01/08/2017, 01/22/2018, 01/16/2019, 01/22/2020, 01/18/2021  ? Influenza,inj,Quad PF,6+ Mos 01/03/2013, 02/12/2014, 01/15/2015, 01/06/2016  ? Influenza-Unspecified 01/23/2019  ? Moderna Sars-Covid-2 Vaccination 06/05/2019, 07/03/2019  ? PFIZER(Purple Top)SARS-COV-2 Vaccination 01/08/2020, 11/08/2020  ? Pneumococcal Conjugate-13 04/22/2015  ? Pneumococcal Polysaccharide-23 01/12/2012  ? Td 11/23/2008   ? Tdap 04/07/2020  ? Zoster Recombinat (Shingrix) 02/13/2018, 12/03/2019  ? Zoster, Live 04/24/2014  ? ?Pertinent  Health Maintenance Due  ?Topic Date Due  ? OPHTHALMOLOGY EXAM  02/01/2021  ? FOOT EXAM  03/03/2021  ? HEMOGLOBIN A1C  12/08/2021  ? LIPID PANEL  06/10/2022  ? COLONOSCOPY (Pts 45-85yr Insurance coverage will need to be confirmed)  02/15/2031  ? INFLUENZA VACCINE  Completed  ? DEXA SCAN  Completed  ? ? ?  06/10/2021  ? 10:07 AM 06/17/2021  ?  1:52 PM 06/24/2021  ?  8:30 AM 06/28/2021  ?  3:21 PM 07/15/2021  ?  8:24 AM  ?Fall Risk  ?Falls in the past year? 0 0 0 0 0  ?Was there an injury with Fall? 0 0 0 0 0  ?Fall Risk Category Calculator 0 0 0 0 0  ?Fall Risk Category Low Low Low Low Low  ?Patient Fall Risk Level Low fall risk Low fall risk Low fall  risk Low fall risk Low fall risk  ?Patient at Risk for Falls Due to No Fall Risks No Fall Risks No Fall Risks No Fall Risks No Fall Risks  ?Fall risk Follow up Falls evaluation completed Falls evaluation completed Falls evaluation completed Falls evaluation completed Falls evaluation completed  ? ?Functional Status Survey: ?  ? ?Vitals:  ? 07/15/21 0820  ?BP: 140/88  ?Pulse: 91  ?Resp: 16  ?Temp: (!) 97.1 ?F (36.2 ?C)  ?SpO2: 93%  ?Weight: 176 lb 3.2 oz (79.9 kg)  ?Height: 5' 4.5" (1.638 m)  ? ?Body mass index is 29.78 kg/m?Marland Kitchen ?Physical Exam ?Vitals reviewed.  ?Constitutional:   ?   General: She is not in acute distress. ?   Appearance: She is overweight. She is not ill-appearing.  ?HENT:  ?   Nose: Nose normal. No congestion or rhinorrhea.  ?   Mouth/Throat:  ?   Mouth: Mucous membranes are moist.  ?   Pharynx: Oropharynx is clear. No oropharyngeal exudate or posterior oropharyngeal erythema.  ?Eyes:  ?   General: No scleral icterus.    ?   Right eye: No discharge.     ?   Left eye: No discharge.  ?   Conjunctiva/sclera: Conjunctivae normal.  ?   Pupils: Pupils are equal, round, and reactive to light.  ?Neck:  ?   Vascular: No carotid bruit.  ?Cardiovascular:  ?    Rate and Rhythm: Normal rate and regular rhythm.  ?   Heart sounds: Normal heart sounds. No murmur heard. ?  No friction rub. No gallop.  ?Pulmonary:  ?   Effort: Pulmonary effort is normal. No respiratory di

## 2021-07-21 ENCOUNTER — Other Ambulatory Visit: Payer: Self-pay | Admitting: Nurse Practitioner

## 2021-07-21 DIAGNOSIS — I639 Cerebral infarction, unspecified: Secondary | ICD-10-CM

## 2021-07-21 DIAGNOSIS — Z8673 Personal history of transient ischemic attack (TIA), and cerebral infarction without residual deficits: Secondary | ICD-10-CM

## 2021-07-29 ENCOUNTER — Other Ambulatory Visit: Payer: Self-pay | Admitting: Family

## 2021-07-29 DIAGNOSIS — M272 Inflammatory conditions of jaws: Secondary | ICD-10-CM

## 2021-08-01 NOTE — Telephone Encounter (Signed)
Patient would need to call and schedule a video or in person visit to get another round of antibiotics  ?

## 2021-08-30 ENCOUNTER — Ambulatory Visit (INDEPENDENT_AMBULATORY_CARE_PROVIDER_SITE_OTHER): Payer: 59 | Admitting: Family

## 2021-08-30 ENCOUNTER — Encounter: Payer: Self-pay | Admitting: Family

## 2021-08-30 VITALS — BP 130/72 | HR 86 | Temp 96.1°F | Resp 18 | Ht 64.5 in

## 2021-08-30 DIAGNOSIS — K122 Cellulitis and abscess of mouth: Secondary | ICD-10-CM

## 2021-08-30 MED ORDER — AMOXICILLIN-POT CLAVULANATE 875-125 MG PO TABS: 1.0000 | ORAL_TABLET | Freq: Two times a day (BID) | ORAL | 0 refills | Status: AC

## 2021-08-30 NOTE — Progress Notes (Signed)
? ?Provider: Marlowe Sax FNP-C ? ?[name], NP ? ?Patient Care Team: ?[name], NP as PCP - General (Nurse Practitioner) ?[name], MD as Consulting Physician (Ophthalmology) ?[name], MD as Consulting Physician (Nephrology) ?[name], NP as Nurse Practitioner (Nurse Practitioner) ? ?Extended Emergency Contact Information ?Primary Emergency Contact: Elgin ?Address: [address] ?         [place], [place] [address] [place] ?Home Phone: [phone] ?Mobile Phone: [phone] ?Relation: Brother ? ?Code Status:  DNR ?Goals of care: Advanced Directive information ? ?  08/30/2021  ?  3:05 PM  ?Advanced Directives  ?Does Patient Have a Medical Advance Directive? Yes  ?Type of Advance Directive Out of facility DNR (pink MOST or yellow form)  ?Does patient want to make changes to medical advance directive? No - Patient declined  ? ? ? ?Chief Complaint  ?Patient presents with  ? Acute Visit  ?  Patient is requesting antibiotics for left sided jaw concerns.   ? ? ?HPI:  ?Pt is a 76 y.o. female seen today for an acute visit for evaluation of left jaw previous abscess site.when she push her inner left buccal feels swollen.Had abscess several months ago required antibiotics x 2.Has broken teeth on lower jaw that needs removal. ?Also nagging pain over right jaw. ?Has appointment with dentist on Monday 09/05/2021. ?She denies any fever but states has had chills sometimes.  ? ? ? ?Past Medical History:  ?Diagnosis Date  ? Anemia   ? Anxiety   ? Arthritis   ? lower back and knees   ? Asthma   ? childhood  ? Barrett's esophagus   ? Chronic kidney disease   ? Coagulation defect ([place])   ? Depression   ? Diabetes mellitus   ? Dysphagia   ? Eczema   ? End stage renal disease ([place])   ? Fluid overload, unspecified   ? History of herpes zoster 02/2010  ? Recovered fully after period of acute herpetic neuralgia tx w/ gabapentin.   ? Hx MRSA infection 2009  ? Hypercalcemia    ? Hyperlipidemia   ? Hypertension   ? Moderate protein-calorie malnutrition ([place])   ? Neuromuscular disorder ([place])   ? chronic pain  ? Personal history of colonic polyps 10/17/2010  ? hyperplastic  ? ?Past Surgical History:  ?Procedure Laterality Date  ? ABDOMINAL HYSTERECTOMY    ? unclear when  ? AV FISTULA PLACEMENT Left 02/13/2017  ? Procedure: ARTERIOVENOUS (AV) GORE-TEX STRETCH GRAFT INSERTION INTO LEFT ARM;  Surgeon: Elam Dutch, MD;  Location[facility] OR;  Service: Vascular;  Laterality: Left;  ? CATARACT EXTRACTION Bilateral   ? COLONOSCOPY    ? thigh surg    ? left side due to MRSA  ? UPPER GASTROINTESTINAL ENDOSCOPY    ? ? ?Allergies  ?Allergen Reactions  ? Lisinopril Other (See Comments)  ?  Abnormal Kidney Function   ? Pioglitazone Other (See Comments)  ?  REACTION: Desquamation of skin of the palm  ? Morphine Other (See Comments)  ? Other Nausea And Vomiting  ? Morphine And Related Nausea And Vomiting  ? Sulfonamide Derivatives Rash  ? ? ?Outpatient Encounter Medications as of 08/30/2021  ?Medication Sig  ? acetaminophen (TYLENOL) 500 MG tablet Take 1 tablet (500 mg total) by mouth every 6 (six) hours as needed.  ? aspirin 81 MG chewable tablet Chew 1 tablet (81 mg total) by mouth daily.  ? B Complex-C-Zn-Folic Acid (DIALYVITE 921 WITH ZINC)  0.8 MG TABS Take 1 tablet by mouth Every Tuesday,Thursday,and Saturday with dialysis.  ? calcium acetate (PHOSLO) 667 MG capsule Take by mouth 3 (three) times daily with meals.  ? cinacalcet (SENSIPAR) 30 MG tablet Take 30 mg by mouth daily.  ? clopidogrel (PLAVIX) 75 MG tablet TAKE 1 TABLET BY MOUTH EVERY DAY  ? denosumab (PROLIA) 60 MG/ML SOSY injection TO BE ADMINISTERED IN PHYSICIAN'S OFFICE. INJECT ONE SYRINGE SUBCOUSLY ONCE EVERY 6 MONTHS. REFRIGERATE. USE WITHIN 14 DAYS ONCE AT ROOM TEMPERATURE.  ? ezetimibe (ZETIA) 10 MG tablet TAKE 1 TABLET BY MOUTH EVERY DAY  ? Methoxy PEG-Epoetin Beta (MIRCERA IJ) Inject 75 mcg as directed every 14 (fourteen) days.  ?  pantoprazole (PROTONIX) 40 MG tablet TAKE 1 TABLET BY MOUTH EVERY DAY  ? PARoxetine (PAXIL) 40 MG tablet TAKE 1 TABLET BY MOUTH EVERY DAY  ? pravastatin (PRAVACHOL) 40 MG tablet TAKE 1 TABLET BY MOUTH EVERYDAY AT BEDTIME  ? sevelamer carbonate (RENVELA) 2.4 g PACK 2.4 g 3 (three) times daily with meals.  ? traZODone (DESYREL) 100 MG tablet TAKE 1 TABLET BY MOUTH EVERYDAY AT BEDTIME  ? triamcinolone cream (KENALOG) 0.1 % APPLY 1 APPLICATION TOPICALLY 3 (THREE) TIMES DAILY AS NEEDED (RASH).  ? [DISCONTINUED] amoxicillin-clavulanate (AUGMENTIN) 875-125 MG tablet Take 1 tablet by mouth 2 (two) times daily.  ? ?No facility-administered encounter medications on file as of 08/30/2021.  ? ? ?Review of Systems  ?Constitutional:  Negative for appetite change, chills, fatigue and fever.  ?HENT:  Positive for dental problem. Negative for congestion, ear pain, rhinorrhea, sinus pressure, sinus pain, sneezing and sore throat.   ?     Left inner jaw soreness and swelling   ?Respiratory:  Negative for cough, chest tightness, shortness of breath and wheezing.   ?Skin:  Negative for color change, pallor, rash and wound.  ?Neurological:  Negative for dizziness, light-headedness and headaches.  ? ?Immunization History  ?Administered Date(s) Administered  ? Hepatitis B, adult 06/21/2017, 07/19/2017, 08/18/2017, 09/11/2019, 10/09/2019  ? Hepb-cpg 02/12/2020  ? Influenza Split 02/03/2011, 01/12/2012  ? Influenza Whole 01/07/2010  ? Influenza, High Dose Seasonal PF 01/08/2017, 01/22/2018, 01/16/2019, 01/22/2020, 01/18/2021  ? Influenza,inj,Quad PF,6+ Mos 01/03/2013, 02/12/2014, 01/15/2015, 01/06/2016  ? Influenza-Unspecified 01/23/2019  ? Moderna Sars-Covid-2 Vaccination 06/05/2019, 07/03/2019  ? PFIZER(Purple Top)SARS-COV-2 Vaccination 01/08/2020, 11/08/2020  ? Pneumococcal Conjugate-13 04/22/2015  ? Pneumococcal Polysaccharide-23 01/12/2012  ? Td 11/23/2008  ? Tdap 04/07/2020  ? Zoster Recombinat (Shingrix) 02/13/2018, 12/03/2019  ?  Zoster, Live 04/24/2014  ? ?Pertinent  Health Maintenance Due  ?Topic Date Due  ? OPHTHALMOLOGY EXAM  02/01/2021  ? FOOT EXAM  03/03/2021  ? INFLUENZA VACCINE  11/22/2021  ? HEMOGLOBIN A1C  12/08/2021  ? LIPID PANEL  06/10/2022  ? COLONOSCOPY (Pts 45-68yr Insurance coverage will need to be confirmed)  02/15/2031  ? DEXA SCAN  Completed  ? ? ?  06/17/2021  ?  1:52 PM 06/24/2021  ?  8:30 AM 06/28/2021  ?  3:21 PM 07/15/2021  ?  8:24 AM 08/30/2021  ?  3:05 PM  ?Fall Risk  ?Falls in the past year? 0 0 0 0 1  ?Was there an injury with Fall? 0 0 0 0 0  ?Fall Risk Category Calculator 0 0 0 0 1  ?Fall Risk Category Low Low Low Low Low  ?Patient Fall Risk Level Low fall risk Low fall risk Low fall risk Low fall risk Low fall risk  ?Patient at Risk for Falls Due to No Fall Risks  No Fall Risks No Fall Risks No Fall Risks History of fall(s)  ?Fall risk Follow up Falls evaluation completed Falls evaluation completed Falls evaluation completed Falls evaluation completed Falls evaluation completed;Education provided;Falls prevention discussed  ? ?Functional Status Survey: ?  ? ?Vitals:  ? 08/30/21 1456  ?BP: 130/72  ?Pulse: 86  ?Resp: 18  ?Temp: (!) 96.1 ?F (35.6 ?C)  ?SpO2: 95%  ?Height: 5' 4.5" (1.638 m)  ? ?Body mass index is 29.78 kg/m?Marland Kitchen ?Physical Exam ?Vitals reviewed.  ?Constitutional:   ?   General: She is not in acute distress. ?   Appearance: She is not ill-appearing.  ?HENT:  ?   Head: Normocephalic.  ?   Right Ear: Tympanic membrane, ear canal and external ear normal. There is no impacted cerumen.  ?   Left Ear: Tympanic membrane, ear canal and external ear normal. There is no impacted cerumen.  ?   Nose: Nose normal. No congestion or rhinorrhea.  ?   Mouth/Throat:  ?   Mouth: Mucous membranes are moist.  ?   Dentition: Dental caries present.  ?   Pharynx: Oropharynx is clear. No oropharyngeal exudate or posterior oropharyngeal erythema.  ?   Comments: Missing teeth.  Left lower jaw several teeth broken caries.  Left buccal  area swollen and tender no redness or drainage. ?Eyes:  ?   General: No scleral icterus.    ?   Right eye: No discharge.     ?   Left eye: No discharge.  ?   Conjunctiva/sclera: Conjunctivae normal.  ?   Pupils:

## 2021-09-02 ENCOUNTER — Encounter: Payer: 59 | Admitting: Family

## 2021-09-05 NOTE — Progress Notes (Signed)
This encounter was created in error - please disregard.

## 2021-09-08 ENCOUNTER — Other Ambulatory Visit: Payer: Self-pay | Admitting: Nurse Practitioner

## 2021-09-08 DIAGNOSIS — I639 Cerebral infarction, unspecified: Secondary | ICD-10-CM

## 2021-09-08 NOTE — Telephone Encounter (Signed)
Patient has request refill on medications Clopidogrel,and Paroxetine last refill for both 03/08/2021. Patient medications have High Risk Warnings. Medication pend and sent to Marlowe Sax, NP due to PCP Dewaine Oats Carlos American, NP .

## 2021-11-02 ENCOUNTER — Other Ambulatory Visit: Payer: Self-pay | Admitting: *Deleted

## 2021-11-02 MED ORDER — PROLIA 60 MG/ML ~~LOC~~ SOSY: PREFILLED_SYRINGE | SUBCUTANEOUS | 0 refills | Status: DC

## 2021-11-02 NOTE — Telephone Encounter (Signed)
Prolia refilled for appointment on 12/09/2021.

## 2021-11-07 ENCOUNTER — Other Ambulatory Visit: Payer: Self-pay | Admitting: Nurse Practitioner

## 2021-11-10 LAB — COMPREHENSIVE METABOLIC PANEL: Globulin: 2.3

## 2021-11-10 LAB — IRON,TIBC AND FERRITIN PANEL
%SAT: 25
Ferritin: 863
Iron: 60
TIBC: 241
UIBC: 181

## 2021-11-10 LAB — BASIC METABOLIC PANEL: Glucose: 89

## 2021-11-10 LAB — HEMOGLOBIN A1C: Hemoglobin A1C: 5.3

## 2021-11-28 ENCOUNTER — Other Ambulatory Visit: Payer: Self-pay | Admitting: Nurse Practitioner

## 2021-11-28 DIAGNOSIS — G4700 Insomnia, unspecified: Secondary | ICD-10-CM

## 2021-11-28 NOTE — Telephone Encounter (Signed)
High warning came up  Pended and sent to [name], NP for approval

## 2021-12-07 LAB — HM DEXA SCAN

## 2021-12-08 ENCOUNTER — Telehealth: Payer: Self-pay | Admitting: Nurse Practitioner

## 2021-12-08 ENCOUNTER — Encounter: Payer: Self-pay | Admitting: Nurse Practitioner

## 2021-12-08 NOTE — Telephone Encounter (Signed)
Bone density has improved on prolia, continue current regimen.

## 2021-12-08 NOTE — Progress Notes (Signed)
Careteam: Patient Care Team: [name], NP as PCP - General (Nurse Practitioner) [name], MD as Consulting Physician (Ophthalmology) [name], MD as Consulting Physician (Nephrology) [name], NP as Nurse Practitioner (Nurse Practitioner)  PLACE OF SERVICE:  [place] Directive information Does Patient Have a Medical Advance Directive?: Yes, Type of Advance Directive: Out of facility DNR (pink MOST or yellow form), Pre-existing out of facility DNR order (yellow form or pink MOST form): Yellow form placed in chart (order not valid for inpatient use), Does patient want to make changes to medical advance directive?: No - Patient declined  Allergies  Allergen Reactions   Lisinopril Other (See Comments)    Abnormal Kidney Function    Pioglitazone Other (See Comments)    REACTION: Desquamation of skin of the palm   Morphine Other (See Comments)   Other Nausea And Vomiting   Morphine And Related Nausea And Vomiting   Sulfonamide Derivatives Rash    Chief Complaint  Patient presents with   Medical Management of Chronic Issues    6 month follow-up and prolia injection. Discuss need for covid booster, eye exam, flu vaccine and A1c of post pone inf patient refuses. Moderate fall risk. Patient denies receiving any vaccines since last visit.      HPI: Patient is a 76 y.o. female for routine follow up  Pharmacy was supposed to deliver her prolia but they did not.   Forgot her blood work from dialysis. She has HD catheter because left arm kept clotting.  She can not get mamogram until cleared by nephrologist.  Continues with HD Tuesday, Thursday and Saturday.   She had teeth removed and abscess has resolved.  She has dentures and she is getting used to that.   Reports mood has been doing okay. She is sleeping well on trazodone.    Review of Systems:  Review of Systems  Constitutional:  Negative for chills, fever and weight loss.   HENT:  Negative for tinnitus.   Respiratory:  Negative for cough, sputum production and shortness of breath.   Cardiovascular:  Negative for chest pain, palpitations and leg swelling.  Gastrointestinal:  Negative for abdominal pain, constipation, diarrhea and heartburn.  Genitourinary:  Negative for dysuria, frequency and urgency.  Musculoskeletal:  Negative for back pain, falls, joint pain and myalgias.  Skin: Negative.   Neurological:  Negative for dizziness and headaches.  Psychiatric/Behavioral:  Negative for depression and memory loss. The patient does not have insomnia.     Past Medical History:  Diagnosis Date   Anemia    Anxiety    Arthritis    lower back and knees    Asthma    childhood   Barrett's esophagus    Chronic kidney disease    Coagulation defect (HCC)    Depression    Diabetes mellitus    Dysphagia    Eczema    End stage renal disease ([facility])    Fluid overload, unspecified    History of herpes zoster 02/2010   Recovered fully after period of acute herpetic neuralgia tx w/ gabapentin.    Hx MRSA infection 2009   Hypercalcemia    Hyperlipidemia    Hypertension    Moderate protein-calorie malnutrition (H[facility])    Neuromuscular disorder (H[facility])    chronic pain   Personal history of colonic polyps 10/17/2010   hyperplastic   Past Surgical History:  Procedure Laterality Date   ABDOMINAL HYSTERECTOMY     unclear when  AV FISTULA PLACEMENT Left 02/13/2017   Procedure: ARTERIOVENOUS (AV) GORE-TEX STRETCH GRAFT INSERTION INTO LEFT ARM;  Surgeon: Elam Dutch, MD;  Location: [facility];  Service: Vascular;  Laterality: Left;   CATARACT EXTRACTION Bilateral    COLONOSCOPY     OTHER SURGICAL HISTORY     Catherer placed by [facility], right upper chest area.   thigh surg     left side due to MRSA   UPPER GASTROINTESTINAL ENDOSCOPY     Social History:   reports that she has been smoking cigarettes. She has a 20.00 pack-year smoking history. She has never used  smokeless tobacco. She reports current alcohol use of about 2.0 standard drinks of alcohol per week. She reports that she does not use drugs.  Family History  Problem Relation Age of Onset   Hypertension Father    Kidney disease Father    Diabetes Brother    Colon cancer Neg Hx    CAD Neg Hx    Rectal cancer Neg Hx    Stomach cancer Neg Hx     Medications: Patient's Medications  New Prescriptions   No medications on file  Previous Medications   ACETAMINOPHEN (TYLENOL) 500 MG TABLET    Take 1 tablet (500 mg total) by mouth every 6 (six) hours as needed.   ASPIRIN 81 MG CHEWABLE TABLET    Chew 1 tablet (81 mg total) by mouth daily.   B COMPLEX-C-ZN-FOLIC ACID (DIALYVITE 517 WITH ZINC) 0.8 MG TABS    Take 1 tablet by mouth Every Tuesday,Thursday,and Saturday with dialysis.   CALCIUM ACETATE (PHOSLO) 667 MG CAPSULE    Take by mouth 3 (three) times daily with meals.   CINACALCET (SENSIPAR) 30 MG TABLET    Take 30 mg by mouth daily.   CLOPIDOGREL (PLAVIX) 75 MG TABLET    TAKE 1 TABLET BY MOUTH EVERY DAY   DENOSUMAB (PROLIA) 60 MG/ML SOSY INJECTION    TO BE ADMINISTERED IN PHYSICIAN'S OFFICE. INJECT ONE SYRINGE SUBCOUSLY ONCE EVERY 6 MONTHS. REFRIGERATE. USE WITHIN 14 DAYS ONCE AT ROOM TEMPERATURE.   EZETIMIBE (ZETIA) 10 MG TABLET    TAKE 1 TABLET BY MOUTH EVERY DAY   METHOXY PEG-EPOETIN BETA (MIRCERA IJ)    Inject 75 mcg as directed every 14 (fourteen) days.   PANTOPRAZOLE (PROTONIX) 40 MG TABLET    TAKE 1 TABLET BY MOUTH EVERY DAY   PAROXETINE (PAXIL) 40 MG TABLET    TAKE 1 TABLET BY MOUTH EVERY DAY   PRAVASTATIN (PRAVACHOL) 40 MG TABLET    TAKE 1 TABLET BY MOUTH EVERYDAY AT BEDTIME   SEVELAMER CARBONATE (RENVELA) 2.4 G PACK    2.4 g 3 (three) times daily with meals.   TRAZODONE (DESYREL) 100 MG TABLET    TAKE 1 TABLET BY MOUTH EVERYDAY AT BEDTIME   TRIAMCINOLONE CREAM (KENALOG) 0.1 %    APPLY 1 APPLICATION TOPICALLY 3 (THREE) TIMES DAILY AS NEEDED (RASH).  Modified Medications   No  medications on file  Discontinued Medications   No medications on file    Physical Exam:  Vitals:   12/09/21 1048  BP: 118/78  Pulse: 85  Temp: 97.9 F (36.6 C)  TempSrc: Temporal  SpO2: 95%  Weight: 172 lb (78 kg)  Height: 5' 4.5" (1.638 m)   Body mass index is 29.07 kg/m. Wt Readings from Last 3 Encounters:  12/09/21 172 lb (78 kg)  07/15/21 176 lb 3.2 oz (79.9 kg)  06/24/21 175 lb 3.2 oz (79.5 kg)  Physical Exam Constitutional:      General: She is not in acute distress.    Appearance: She is well-developed. She is not diaphoretic.  HENT:     Head: Normocephalic and atraumatic.     Mouth/Throat:     Pharynx: No oropharyngeal exudate.  Eyes:     Conjunctiva/sclera: Conjunctivae normal.     Pupils: Pupils are equal, round, and reactive to light.  Cardiovascular:     Rate and Rhythm: Normal rate and regular rhythm.     Heart sounds: Normal heart sounds.     Comments: HD catheter to right chest Pulmonary:     Effort: Pulmonary effort is normal.     Breath sounds: Normal breath sounds.  Abdominal:     General: Bowel sounds are normal.     Palpations: Abdomen is soft.  Musculoskeletal:     Cervical back: Normal range of motion and neck supple.     Right lower leg: No edema.     Left lower leg: No edema.  Skin:    General: Skin is warm and dry.  Neurological:     Mental Status: She is alert.  Psychiatric:        Mood and Affect: Mood normal.     Labs reviewed: Basic Metabolic Panel: Recent Labs    04/24/21 0000 05/25/21 0000  K 4.2 3.8  CALCIUM 9.4 9.4   Liver Function Tests: Recent Labs    04/24/21 0000 05/25/21 0000 06/10/21 1112  AST  --   --  19  ALT  --   --  29  BILITOT  --   --  0.4  PROT  --   --  6.6  ALBUMIN 3.9 4.2  --    No results for input(s): "LIPASE", "AMYLASE" in the last 8760 hours. No results for input(s): "AMMONIA" in the last 8760 hours. CBC: Recent Labs    04/24/21 0000 05/25/21 0000  HGB 10.1* 10.0*   Lipid  Panel: Recent Labs    06/10/21 1112  CHOL 151  HDL 47*  LDLCALC 68  TRIG 272*  CHOLHDL 3.2   TSH: No results for input(s): "TSH" in the last 8760 hours. A1C: Lab Results  Component Value Date   HGBA1C 4.6 06/10/2021     Assessment/Plan  1. Essential hypertension -Blood pressure well controlled, goal bp <140/90 Continue current medications and dietary modifications follow metabolic panel  2. Gastroesophageal reflux disease, unspecified whether esophagitis present -stable on protonix  3. Controlled type 2 diabetes mellitus with diabetic polyneuropathy, without long-term current use of insulin ([place]) -Encouraged dietary compliance, routine foot care/monitoring and to keep up with diabetic eye exams through ophthalmology  - Hemoglobin A1c  4. ESRD (end stage renal disease) on dialysis [facility]) -continues on HD Tuesday, Thursday and Saturday  5. Hyperlipidemia with target LDL less than 70 -controlled on prvastatn  6. Abscess of internal cheek, left -has resolved. She has had all teeth removed  7. Senile osteoporosis -she will follow up on getting the prolia injection sent to office.   8. Insomnia, unspecified type Controlled on trazodone.  9. Mild single current episode of major depressive disorder (HCC) Stable on paxil. Continue current regimen.   Return in about 6 months (around 06/11/2022) for routine follow up. Carlos American. [place], [facility] [phone]

## 2021-12-08 NOTE — Telephone Encounter (Signed)
Called patient and phone rang busy. Unable to leave voicemail. We need to make another attempt to call patient.

## 2021-12-09 ENCOUNTER — Telehealth: Payer: Self-pay

## 2021-12-09 ENCOUNTER — Ambulatory Visit (INDEPENDENT_AMBULATORY_CARE_PROVIDER_SITE_OTHER): Payer: 59 | Admitting: Nurse Practitioner

## 2021-12-09 ENCOUNTER — Encounter: Payer: Self-pay | Admitting: Nurse Practitioner

## 2021-12-09 VITALS — BP 118/78 | HR 85 | Temp 97.9°F | Ht 64.5 in | Wt 172.0 lb

## 2021-12-09 DIAGNOSIS — G4700 Insomnia, unspecified: Secondary | ICD-10-CM

## 2021-12-09 DIAGNOSIS — K219 Gastro-esophageal reflux disease without esophagitis: Secondary | ICD-10-CM

## 2021-12-09 DIAGNOSIS — M810 Age-related osteoporosis without current pathological fracture: Secondary | ICD-10-CM

## 2021-12-09 DIAGNOSIS — I10 Essential (primary) hypertension: Secondary | ICD-10-CM

## 2021-12-09 DIAGNOSIS — E1142 Type 2 diabetes mellitus with diabetic polyneuropathy: Secondary | ICD-10-CM | POA: Diagnosis not present

## 2021-12-09 DIAGNOSIS — N186 End stage renal disease: Secondary | ICD-10-CM

## 2021-12-09 DIAGNOSIS — F320 Major depressive disorder, single episode, mild: Secondary | ICD-10-CM

## 2021-12-09 DIAGNOSIS — Z992 Dependence on renal dialysis: Secondary | ICD-10-CM

## 2021-12-09 DIAGNOSIS — E785 Hyperlipidemia, unspecified: Secondary | ICD-10-CM

## 2021-12-09 DIAGNOSIS — K122 Cellulitis and abscess of mouth: Secondary | ICD-10-CM

## 2021-12-09 NOTE — Telephone Encounter (Addendum)
Patient called and stated that her Prolia should arrive here in office [facility])  on August 24th.  Message routed to [name], NP, [place] and Anita May, [place] [facility])

## 2021-12-09 NOTE — Patient Instructions (Addendum)
Check on prolia  See if dialysis will draw A1c Check with nephrologist about getting mammogram   with catheter

## 2021-12-13 ENCOUNTER — Encounter: Payer: Self-pay | Admitting: Nurse Practitioner

## 2021-12-13 NOTE — Telephone Encounter (Signed)
Patient called and stated that the Dialysis Center told her that they do NOT want patient to be taking the Prolia Injection due to her Kidney's.   Please Advise.

## 2021-12-13 NOTE — Telephone Encounter (Signed)
If the nephrologist says she should not be getting this then we will stop any further injections.

## 2021-12-14 ENCOUNTER — Telehealth: Payer: Self-pay

## 2021-12-14 NOTE — Telephone Encounter (Signed)
Outgoing call placed to patient, no answer

## 2021-12-14 NOTE — Telephone Encounter (Signed)
Tried calling patient, number just rings.

## 2021-12-14 NOTE — Telephone Encounter (Signed)
Discussed with patient and she verbalized her understanding that Prolia will be discontinued at this time.

## 2021-12-14 NOTE — Telephone Encounter (Signed)
Patient called stating the nurse at [facility] called her and advised that she discontinue prolia injection, however patient does not recall why they told her to discontinue as she was sleep at the time of the call. Patient would like to know what Janett Billow thinks about this.   I called the dialysis center spoke with the nurse who was preoccupied at the time of the call. The nurse stated she would review the doctor's notes and call us back when time allowed.  Awaiting on return call from the nurse before forwarding message to PCP

## 2021-12-14 NOTE — Telephone Encounter (Signed)
Noted will discontinue at this time.

## 2021-12-14 NOTE — Telephone Encounter (Signed)
[name], RN returned called stating she reviewed the note from NP, Alpha Gula and it was indicated that prolia is a contraindication for dialysis patients and that was the reason for advising patient to discontinue injection.

## 2021-12-15 NOTE — Addendum Note (Signed)
Addended by: Logan Bores on: 12/15/2021 09:55 AM   Modules accepted: Orders

## 2021-12-15 NOTE — Telephone Encounter (Signed)
I removed prolia from medication list and notified Rodena Piety who manages the prolia patients

## 2021-12-15 NOTE — Telephone Encounter (Signed)
[name], [place]    12/14/21  9:16 AM Note [name], RN returned called stating she reviewed the note from NP, [name] and it was indicated that prolia is a contraindication for dialysis patients and that was the reason for advising patient to discontinue injection.       Deborah Chandler, NP to [name], [facility]      12/14/21  9:36 AM Note Noted will discontinue at this time.       [name], [place]   12/15/21  9:55 AM Note I removed prolia from medication list and notified [name] who manages the prolia patients      December 14, 2021 [name], [facility]   12/14/21  4:17 PM Note Discussed with patient and she verbalized her understanding that Prolia will be discontinued at this time.

## 2021-12-15 NOTE — Telephone Encounter (Signed)
Received patient's Prolia in the mail today but patient has stated that she does not want to continue taking it at the recommendation of the Dialysis Provider saying there is a Contraindication.   Tried calling patient to see what she wanted [place] to do with her medication received. (Have placed in Med Refrigerator).  Rings busy X 3, will try again later.

## 2021-12-16 NOTE — Telephone Encounter (Signed)
Tried calling patient and rings Busy

## 2021-12-20 NOTE — Telephone Encounter (Signed)
Tried calling patient, Phone just rings on Mobile and Home #.

## 2021-12-22 NOTE — Telephone Encounter (Signed)
Tried calling patient and home number just rings and Cell Number rings busy.

## 2021-12-28 ENCOUNTER — Other Ambulatory Visit: Payer: Self-pay | Admitting: *Deleted

## 2021-12-28 DIAGNOSIS — N186 End stage renal disease: Secondary | ICD-10-CM

## 2021-12-29 ENCOUNTER — Encounter: Payer: Self-pay | Admitting: Vascular Surgery

## 2021-12-29 ENCOUNTER — Ambulatory Visit (INDEPENDENT_AMBULATORY_CARE_PROVIDER_SITE_OTHER)
Admission: RE | Admit: 2021-12-29 | Discharge: 2021-12-29 | Disposition: A | Discharge status: Home / Self Care | Payer: 59 | Source: Ambulatory Visit | Attending: Vascular Surgery | Admitting: Vascular Surgery

## 2021-12-29 ENCOUNTER — Ambulatory Visit (HOSPITAL_COMMUNITY)
Admission: RE | Admit: 2021-12-29 | Discharge: 2021-12-29 | Disposition: A | Discharge status: Home / Self Care | Payer: 59 | Source: Ambulatory Visit | Attending: Vascular Surgery | Admitting: Vascular Surgery

## 2021-12-29 ENCOUNTER — Ambulatory Visit (INDEPENDENT_AMBULATORY_CARE_PROVIDER_SITE_OTHER): Payer: 59 | Admitting: Vascular Surgery

## 2021-12-29 VITALS — BP 167/101 | HR 82 | Temp 98.1°F | Resp 20 | Ht 64.5 in | Wt 171.0 lb

## 2021-12-29 DIAGNOSIS — Z992 Dependence on renal dialysis: Secondary | ICD-10-CM | POA: Diagnosis not present

## 2021-12-29 DIAGNOSIS — N186 End stage renal disease: Secondary | ICD-10-CM | POA: Insufficient documentation

## 2021-12-29 NOTE — H&P (View-Only) (Signed)
ASSESSMENT & PLAN   END-STAGE RENAL DISEASE: This patient was referred for hemodialysis access.  She had an old left arm graft placed in the past.  I cannot locate this op note so I assume this was done elsewhere.  This is chronically occluded.  Based on her vein map her best chance for a fistula would be a right brachiocephalic fistula.  If that were not possible we would place an AV graft in the right arm. I have explained the indications for placement of an AV fistula or AV graft. I've explained that if at all possible we will place an AV fistula.  I have reviewed the risks of placement of an AV fistula including but not limited to: failure of the fistula to mature, need for subsequent interventions, and thrombosis. In addition I have reviewed the potential complications of placement of an AV graft. These risks include, but are not limited to, graft thrombosis, graft infection, wound healing problems, bleeding, arm swelling, and steal syndrome. All the patient's questions were answered and they are agreeable to proceed with surgery.  She dialyzes on Tuesdays Thursdays and Saturdays.  He has scheduled this for Monday, 01/16/2022.  REASON FOR CONSULT:    To evaluate for new hemodialysis access.  The consult was requested by Dr. Corliss Parish.  HPI:   [name] is a 76 y.o. female who was referred for hemodialysis access.  We were asked to place a fistula if possible.  If this were not possible we were asked to place an AV graft.  This patient has a functioning right IJ tunneled dialysis catheter.  She dialyzes on Tuesdays Thursdays and Saturdays.  She denies any recent uremic symptoms.  She is on Plavix.  She does not have a pacemaker.  She has a right IJ tunneled dialysis catheter.  Past Medical History:  Diagnosis Date   Anemia    Anxiety    Arthritis    lower back and knees    Asthma    childhood   Barrett's esophagus    Chronic kidney disease    Coagulation defect  (HCC)    Depression    Diabetes mellitus    Dysphagia    Eczema    End stage renal disease (HCC)    Fluid overload, unspecified    History of herpes zoster 02/2010   Recovered fully after period of acute herpetic neuralgia tx w/ gabapentin.    Hx MRSA infection 2009   Hypercalcemia    Hyperlipidemia    Hypertension    Moderate protein-calorie malnutrition (HCC)    Neuromuscular disorder (HCC)    chronic pain   Personal history of colonic polyps 10/17/2010   hyperplastic    Family History  Problem Relation Age of Onset   Hypertension Father    Kidney disease Father    Diabetes Brother    Colon cancer Neg Hx    CAD Neg Hx    Rectal cancer Neg Hx    Stomach cancer Neg Hx     SOCIAL HISTORY: Social History   Tobacco Use   Smoking status: Every Day    Packs/day: 0.50    Years: 40.00    Total pack years: 20.00    Types: Cigarettes   Smokeless tobacco: Never   Tobacco comments:    smokes 1 pack of cigerettes a week- off and on  Substance Use Topics   Alcohol use: Yes    Alcohol/week: 2.0 standard drinks of alcohol    Types: 2  Standard drinks or equivalent per week    Comment: wine and beer- occasionally     Allergies  Allergen Reactions   Lisinopril Other (See Comments)    Abnormal Kidney Function    Pioglitazone Other (See Comments)    REACTION: Desquamation of skin of the palm   Morphine Other (See Comments)   Other Nausea And Vomiting   Morphine And Related Nausea And Vomiting   Sulfonamide Derivatives Rash    Current Outpatient Medications  Medication Sig Dispense Refill   acetaminophen (TYLENOL) 500 MG tablet Take 1 tablet (500 mg total) by mouth every 6 (six) hours as needed. 30 tablet 0   aspirin 81 MG chewable tablet Chew 1 tablet (81 mg total) by mouth daily. 30 tablet 0   B Complex-C-Zn-Folic Acid (DIALYVITE 536 WITH ZINC) 0.8 MG TABS Take 1 tablet by mouth Every Tuesday,Thursday,and Saturday with dialysis.     calcium acetate (PHOSLO) 667 MG  capsule Take by mouth 3 (three) times daily with meals.     cinacalcet (SENSIPAR) 30 MG tablet Take 30 mg by mouth daily.  12   clopidogrel (PLAVIX) 75 MG tablet TAKE 1 TABLET BY MOUTH EVERY DAY 90 tablet 1   ezetimibe (ZETIA) 10 MG tablet TAKE 1 TABLET BY MOUTH EVERY DAY 90 tablet 1   Methoxy PEG-Epoetin Beta (MIRCERA IJ) Inject 75 mcg as directed every 14 (fourteen) days.     pantoprazole (PROTONIX) 40 MG tablet TAKE 1 TABLET BY MOUTH EVERY DAY 90 tablet 3   PARoxetine (PAXIL) 40 MG tablet TAKE 1 TABLET BY MOUTH EVERY DAY 90 tablet 1   pravastatin (PRAVACHOL) 40 MG tablet TAKE 1 TABLET BY MOUTH EVERYDAY AT BEDTIME 90 tablet 1   sevelamer carbonate (RENVELA) 2.4 g PACK 2.4 g 3 (three) times daily with meals.     traZODone (DESYREL) 100 MG tablet TAKE 1 TABLET BY MOUTH EVERYDAY AT BEDTIME 90 tablet 1   triamcinolone cream (KENALOG) 0.1 % APPLY 1 APPLICATION TOPICALLY 3 (THREE) TIMES DAILY AS NEEDED (RASH). 60 g 1   No current facility-administered medications for this visit.    REVIEW OF SYSTEMS:  '[X]'$  denotes positive finding, '[ ]'$  denotes negative finding Cardiac  Comments:  Chest pain or chest pressure:    Shortness of breath upon exertion:    Short of breath when lying flat:    Irregular heart rhythm:        Vascular    Pain in calf, thigh, or hip brought on by ambulation:    Pain in feet at night that wakes you up from your sleep:     Blood clot in your veins:    Leg swelling:         Pulmonary    Oxygen at home:    Productive cough:     Wheezing:         Neurologic    Sudden weakness in arms or legs:     Sudden numbness in arms or legs:     Sudden onset of difficulty speaking or slurred speech:    Temporary loss of vision in one eye:     Problems with dizziness:         Gastrointestinal    Blood in stool:     Vomited blood:         Genitourinary    Burning when urinating:     Blood in urine:        Psychiatric    Major depression:  Hematologic     Bleeding problems:    Problems with blood clotting too easily:        Skin    Rashes or ulcers:        Constitutional    Fever or chills:    -  PHYSICAL EXAM:   Vitals:   12/29/21 1508  BP: (!) 167/101  Pulse: 82  Resp: 20  Temp: 98.1 F (36.7 C)  SpO2: 95%  Weight: 171 lb (77.6 kg)  Height: 5' 4.5" (1.638 m)   Body mass index is 28.9 kg/m. GENERAL: The patient is a well-nourished female, in no acute distress. The vital signs are documented above. CARDIAC: There is a regular rate and rhythm.  VASCULAR: I do not detect carotid bruits. She has palpable radial pulses bilaterally. She has a nonfunctioning left upper arm graft. PULMONARY: There is good air exchange bilaterally without wheezing or rales. ABDOMEN: Soft and non-tender with normal pitched bowel sounds.  MUSCULOSKELETAL: There are no major deformities. NEUROLOGIC: No focal weakness or paresthesias are detected. SKIN: There are no ulcers or rashes noted. PSYCHIATRIC: The patient has a normal affect.  DATA:    ARTERIAL DUPLEX: I have independently interpreted her arterial duplex scan today.  On the right side there is a triphasic radial and ulnar waveform.  Brachial artery measures 5 mm in diameter.  On the left side, there is a triphasic radial and ulnar waveform.  The brachial artery measures 5.5 mm in diameter  BILATERAL UPPER EXTREMITY VEIN MAP: I have independently interpreted her upper extremity vein map.  On the right side the upper arm cephalic vein has diameters ranging from 3.2-4.4 mm although it narrows down to 2.7 by the shoulder.  I think she would be a reasonable candidate for a right brachiocephalic fistula.  The forearm cephalic vein is small.  The basilic vein on the right is marginal.  On the left side the forearm cephalic vein was small.  Left upper arm cephalic vein is marginal in size.  The basilic vein is marginal in size.   Deitra Mayo [facility]  [facility]

## 2021-12-29 NOTE — Progress Notes (Signed)
ASSESSMENT & PLAN   END-STAGE RENAL DISEASE: This patient was referred for hemodialysis access.  She had an old left arm graft placed in the past.  I cannot locate this op note so I assume this was done elsewhere.  This is chronically occluded.  Based on her vein map her best chance for a fistula would be a right brachiocephalic fistula.  If that were not possible we would place an AV graft in the right arm. I have explained the indications for placement of an AV fistula or AV graft. I've explained that if at all possible we will place an AV fistula.  I have reviewed the risks of placement of an AV fistula including but not limited to: failure of the fistula to mature, need for subsequent interventions, and thrombosis. In addition I have reviewed the potential complications of placement of an AV graft. These risks include, but are not limited to, graft thrombosis, graft infection, wound healing problems, bleeding, arm swelling, and steal syndrome. All the patient's questions were answered and they are agreeable to proceed with surgery.  She dialyzes on Tuesdays Thursdays and Saturdays.  He has scheduled this for Monday, 01/16/2022.  REASON FOR CONSULT:    To evaluate for new hemodialysis access.  The consult was requested by Dr. Corliss Parish.  HPI:   [name] is a 76 y.o. female who was referred for hemodialysis access.  We were asked to place a fistula if possible.  If this were not possible we were asked to place an AV graft.  This patient has a functioning right IJ tunneled dialysis catheter.  She dialyzes on Tuesdays Thursdays and Saturdays.  She denies any recent uremic symptoms.  She is on Plavix.  She does not have a pacemaker.  She has a right IJ tunneled dialysis catheter.  Past Medical History:  Diagnosis Date   Anemia    Anxiety    Arthritis    lower back and knees    Asthma    childhood   Barrett's esophagus    Chronic kidney disease    Coagulation defect  (HCC)    Depression    Diabetes mellitus    Dysphagia    Eczema    End stage renal disease (HCC)    Fluid overload, unspecified    History of herpes zoster 02/2010   Recovered fully after period of acute herpetic neuralgia tx w/ gabapentin.    Hx MRSA infection 2009   Hypercalcemia    Hyperlipidemia    Hypertension    Moderate protein-calorie malnutrition (HCC)    Neuromuscular disorder (HCC)    chronic pain   Personal history of colonic polyps 10/17/2010   hyperplastic    Family History  Problem Relation Age of Onset   Hypertension Father    Kidney disease Father    Diabetes Brother    Colon cancer Neg Hx    CAD Neg Hx    Rectal cancer Neg Hx    Stomach cancer Neg Hx     SOCIAL HISTORY: Social History   Tobacco Use   Smoking status: Every Day    Packs/day: 0.50    Years: 40.00    Total pack years: 20.00    Types: Cigarettes   Smokeless tobacco: Never   Tobacco comments:    smokes 1 pack of cigerettes a week- off and on  Substance Use Topics   Alcohol use: Yes    Alcohol/week: 2.0 standard drinks of alcohol    Types: 2  Standard drinks or equivalent per week    Comment: wine and beer- occasionally     Allergies  Allergen Reactions   Lisinopril Other (See Comments)    Abnormal Kidney Function    Pioglitazone Other (See Comments)    REACTION: Desquamation of skin of the palm   Morphine Other (See Comments)   Other Nausea And Vomiting   Morphine And Related Nausea And Vomiting   Sulfonamide Derivatives Rash    Current Outpatient Medications  Medication Sig Dispense Refill   acetaminophen (TYLENOL) 500 MG tablet Take 1 tablet (500 mg total) by mouth every 6 (six) hours as needed. 30 tablet 0   aspirin 81 MG chewable tablet Chew 1 tablet (81 mg total) by mouth daily. 30 tablet 0   B Complex-C-Zn-Folic Acid (DIALYVITE 559 WITH ZINC) 0.8 MG TABS Take 1 tablet by mouth Every Tuesday,Thursday,and Saturday with dialysis.     calcium acetate (PHOSLO) 667 MG  capsule Take by mouth 3 (three) times daily with meals.     cinacalcet (SENSIPAR) 30 MG tablet Take 30 mg by mouth daily.  12   clopidogrel (PLAVIX) 75 MG tablet TAKE 1 TABLET BY MOUTH EVERY DAY 90 tablet 1   ezetimibe (ZETIA) 10 MG tablet TAKE 1 TABLET BY MOUTH EVERY DAY 90 tablet 1   Methoxy PEG-Epoetin Beta (MIRCERA IJ) Inject 75 mcg as directed every 14 (fourteen) days.     pantoprazole (PROTONIX) 40 MG tablet TAKE 1 TABLET BY MOUTH EVERY DAY 90 tablet 3   PARoxetine (PAXIL) 40 MG tablet TAKE 1 TABLET BY MOUTH EVERY DAY 90 tablet 1   pravastatin (PRAVACHOL) 40 MG tablet TAKE 1 TABLET BY MOUTH EVERYDAY AT BEDTIME 90 tablet 1   sevelamer carbonate (RENVELA) 2.4 g PACK 2.4 g 3 (three) times daily with meals.     traZODone (DESYREL) 100 MG tablet TAKE 1 TABLET BY MOUTH EVERYDAY AT BEDTIME 90 tablet 1   triamcinolone cream (KENALOG) 0.1 % APPLY 1 APPLICATION TOPICALLY 3 (THREE) TIMES DAILY AS NEEDED (RASH). 60 g 1   No current facility-administered medications for this visit.    REVIEW OF SYSTEMS:  '[X]'$  denotes positive finding, '[ ]'$  denotes negative finding Cardiac  Comments:  Chest pain or chest pressure:    Shortness of breath upon exertion:    Short of breath when lying flat:    Irregular heart rhythm:        Vascular    Pain in calf, thigh, or hip brought on by ambulation:    Pain in feet at night that wakes you up from your sleep:     Blood clot in your veins:    Leg swelling:         Pulmonary    Oxygen at home:    Productive cough:     Wheezing:         Neurologic    Sudden weakness in arms or legs:     Sudden numbness in arms or legs:     Sudden onset of difficulty speaking or slurred speech:    Temporary loss of vision in one eye:     Problems with dizziness:         Gastrointestinal    Blood in stool:     Vomited blood:         Genitourinary    Burning when urinating:     Blood in urine:        Psychiatric    Major depression:  Hematologic     Bleeding problems:    Problems with blood clotting too easily:        Skin    Rashes or ulcers:        Constitutional    Fever or chills:    -  PHYSICAL EXAM:   Vitals:   12/29/21 1508  BP: (!) 167/101  Pulse: 82  Resp: 20  Temp: 98.1 F (36.7 C)  SpO2: 95%  Weight: 171 lb (77.6 kg)  Height: 5' 4.5" (1.638 m)   Body mass index is 28.9 kg/m. GENERAL: The patient is a well-nourished female, in no acute distress. The vital signs are documented above. CARDIAC: There is a regular rate and rhythm.  VASCULAR: I do not detect carotid bruits. She has palpable radial pulses bilaterally. She has a nonfunctioning left upper arm graft. PULMONARY: There is good air exchange bilaterally without wheezing or rales. ABDOMEN: Soft and non-tender with normal pitched bowel sounds.  MUSCULOSKELETAL: There are no major deformities. NEUROLOGIC: No focal weakness or paresthesias are detected. SKIN: There are no ulcers or rashes noted. PSYCHIATRIC: The patient has a normal affect.  DATA:    ARTERIAL DUPLEX: I have independently interpreted her arterial duplex scan today.  On the right side there is a triphasic radial and ulnar waveform.  Brachial artery measures 5 mm in diameter.  On the left side, there is a triphasic radial and ulnar waveform.  The brachial artery measures 5.5 mm in diameter  BILATERAL UPPER EXTREMITY VEIN MAP: I have independently interpreted her upper extremity vein map.  On the right side the upper arm cephalic vein has diameters ranging from 3.2-4.4 mm although it narrows down to 2.7 by the shoulder.  I think she would be a reasonable candidate for a right brachiocephalic fistula.  The forearm cephalic vein is small.  The basilic vein on the right is marginal.  On the left side the forearm cephalic vein was small.  Left upper arm cephalic vein is marginal in size.  The basilic vein is marginal in size.   Deitra Mayo [facility]  [facility]

## 2021-12-30 ENCOUNTER — Other Ambulatory Visit: Payer: Self-pay

## 2021-12-30 DIAGNOSIS — N186 End stage renal disease: Secondary | ICD-10-CM

## 2022-01-07 ENCOUNTER — Other Ambulatory Visit: Payer: Self-pay | Admitting: Nephrology

## 2022-01-07 DIAGNOSIS — N186 End stage renal disease: Secondary | ICD-10-CM

## 2022-01-08 ENCOUNTER — Emergency Department (HOSPITAL_COMMUNITY): Payer: 59

## 2022-01-08 ENCOUNTER — Encounter (HOSPITAL_COMMUNITY): Payer: Self-pay | Admitting: Emergency Medicine

## 2022-01-08 ENCOUNTER — Other Ambulatory Visit: Payer: Self-pay

## 2022-01-08 ENCOUNTER — Emergency Department (HOSPITAL_COMMUNITY)
Admission: EM | Admit: 2022-01-08 | Discharge: 2022-01-08 | Disposition: A | Discharge status: Home / Self Care | Payer: 59 | Attending: Emergency Medicine | Admitting: Emergency Medicine

## 2022-01-08 DIAGNOSIS — I120 Hypertensive chronic kidney disease with stage 5 chronic kidney disease or end stage renal disease: Secondary | ICD-10-CM | POA: Diagnosis not present

## 2022-01-08 DIAGNOSIS — E1122 Type 2 diabetes mellitus with diabetic chronic kidney disease: Secondary | ICD-10-CM | POA: Diagnosis not present

## 2022-01-08 DIAGNOSIS — Z79899 Other long term (current) drug therapy: Secondary | ICD-10-CM | POA: Insufficient documentation

## 2022-01-08 DIAGNOSIS — Z7982 Long term (current) use of aspirin: Secondary | ICD-10-CM | POA: Insufficient documentation

## 2022-01-08 DIAGNOSIS — Z452 Encounter for adjustment and management of vascular access device: Secondary | ICD-10-CM | POA: Diagnosis present

## 2022-01-08 DIAGNOSIS — N186 End stage renal disease: Secondary | ICD-10-CM | POA: Diagnosis not present

## 2022-01-08 LAB — CBC WITH DIFFERENTIAL/PLATELET
Abs Immature Granulocytes: 0.03 10*3/uL (ref 0.00–0.07)
Basophils Absolute: 0.1 10*3/uL (ref 0.0–0.1)
Basophils Relative: 1 %
Eosinophils Absolute: 0.7 10*3/uL — ABNORMAL HIGH (ref 0.0–0.5)
Eosinophils Relative: 8 %
HCT: 28.4 % — ABNORMAL LOW (ref 36.0–46.0)
Hemoglobin: 9.4 g/dL — ABNORMAL LOW (ref 12.0–15.0)
Immature Granulocytes: 0 %
Lymphocytes Relative: 27 %
Lymphs Abs: 2.2 10*3/uL (ref 0.7–4.0)
MCH: 33.5 pg (ref 26.0–34.0)
MCHC: 33.1 g/dL (ref 30.0–36.0)
MCV: 101.1 fL — ABNORMAL HIGH (ref 80.0–100.0)
Monocytes Absolute: 0.5 10*3/uL (ref 0.1–1.0)
Monocytes Relative: 6 %
Neutro Abs: 4.7 10*3/uL (ref 1.7–7.7)
Neutrophils Relative %: 58 %
Platelets: 154 10*3/uL (ref 150–400)
RBC: 2.81 MIL/uL — ABNORMAL LOW (ref 3.87–5.11)
RDW: 15.3 % (ref 11.5–15.5)
WBC: 8.1 10*3/uL (ref 4.0–10.5)
nRBC: 0 % (ref 0.0–0.2)

## 2022-01-08 LAB — COMPREHENSIVE METABOLIC PANEL
ALT: 21 U/L (ref 0–44)
AST: 32 U/L (ref 15–41)
Albumin: 3.3 g/dL — ABNORMAL LOW (ref 3.5–5.0)
Alkaline Phosphatase: 72 U/L (ref 38–126)
Anion gap: 14 (ref 5–15)
BUN: 41 mg/dL — ABNORMAL HIGH (ref 8–23)
CO2: 28 mmol/L (ref 22–32)
Calcium: 9.2 mg/dL (ref 8.9–10.3)
Chloride: 98 mmol/L (ref 98–111)
Creatinine, Ser: 12.94 mg/dL — ABNORMAL HIGH (ref 0.44–1.00)
GFR, Estimated: 3 mL/min — ABNORMAL LOW (ref >60)
Glucose, Bld: 109 mg/dL — ABNORMAL HIGH (ref 70–99)
Potassium: 4.2 mmol/L (ref 3.5–5.1)
Sodium: 140 mmol/L (ref 135–145)
Total Bilirubin: 0.6 mg/dL (ref 0.3–1.2)
Total Protein: 5.5 g/dL — ABNORMAL LOW (ref 6.5–8.1)

## 2022-01-08 LAB — PHOSPHORUS: Phosphorus: 5.4 mg/dL — ABNORMAL HIGH (ref 2.5–4.6)

## 2022-01-08 LAB — MAGNESIUM: Magnesium: 2.6 mg/dL — ABNORMAL HIGH (ref 1.7–2.4)

## 2022-01-08 NOTE — Discharge Instructions (Signed)
Contact dialysis center in the morning and tell them the catheter completely fell out today and needs replacement.  Return to ED if you become symptomatic or for any new concerns.

## 2022-01-08 NOTE — ED Triage Notes (Addendum)
Patient BIB [facility] from home after accidentally removing hemodialysis catheter from R side of chest. EMS estimated about 100 cc's of blood from removal. Patient had her last  full dialysis session on Thursday, went on Saturday as normal and couldn't finish the whole session due to concerns with the catheter. VSS, per EMS patient hypertensive. Bleeding controlled at this time.

## 2022-01-08 NOTE — ED Provider Notes (Signed)
[facility] EMERGENCY DEPARTMENT Provider Note   CSN: [id number] Arrival date & time: 01/08/22  1719     History  Chief Complaint  Patient presents with   Vascular Access Problem    [name] is a 76 y.o. female. Hx ESRD on HD through right chest perm cath. Last session 3 days ago, supposed to have session yesterday but not performed because catheter was starting to fall out, so they said to follow up tomorrow. However today the catheter fell all the way out while she was taking her shirt off. Noted significant bleeding, but resolved with direct pressure. Arrived via EMS.   HPI     Home Medications Prior to Admission medications   Medication Sig Start Date End Date Taking? Authorizing Provider  acetaminophen (TYLENOL) 500 MG tablet Take 1 tablet (500 mg total) by mouth every 6 (six) hours as needed. 06/17/21   Ngetich, Dinah C, NP  aspirin 81 MG chewable tablet Chew 1 tablet (81 mg total) by mouth daily. 05/12/17   Regalado, Jerald Kief A, MD  B Complex-C-Zn-Folic Acid (DIALYVITE 628 WITH ZINC) 0.8 MG TABS Take 1 tablet by mouth Every Tuesday,Thursday,and Saturday with dialysis. 07/01/21   [provider]  calcium acetate (PHOSLO) 667 MG capsule Take by mouth 3 (three) times daily with meals.    [provider]  cinacalcet (SENSIPAR) 30 MG tablet Take 30 mg by mouth daily. 12/06/17   [provider]  clopidogrel (PLAVIX) 75 MG tablet TAKE 1 TABLET BY MOUTH EVERY DAY 09/08/21   Ngetich, Dinah C, NP  ezetimibe (ZETIA) 10 MG tablet TAKE 1 TABLET BY MOUTH EVERY DAY 07/21/21   [name], NP  Methoxy PEG-Epoetin Beta (MIRCERA IJ) Inject 75 mcg as directed every 14 (fourteen) days. 07/07/21 07/06/22  [provider]  pantoprazole (PROTONIX) 40 MG tablet TAKE 1 TABLET BY MOUTH EVERY DAY 05/18/21   Zehr, Janett Billow D, PA-C  PARoxetine (PAXIL) 40 MG tablet TAKE 1 TABLET BY MOUTH EVERY DAY 09/08/21   Ngetich, Dinah C, NP  pravastatin  (PRAVACHOL) 40 MG tablet TAKE 1 TABLET BY MOUTH EVERYDAY AT BEDTIME 07/21/21   [name], NP  sevelamer carbonate (RENVELA) 2.4 g PACK 2.4 g 3 (three) times daily with meals.    [provider]  traZODone (DESYREL) 100 MG tablet TAKE 1 TABLET BY MOUTH EVERYDAY AT BEDTIME 11/28/21   [name], NP  triamcinolone cream (KENALOG) 0.1 % APPLY 1 APPLICATION TOPICALLY 3 (THREE) TIMES DAILY AS NEEDED (RASH). 11/14/19   [name], NP      Allergies    Lisinopril, Pioglitazone, Morphine, Other, Morphine and related, and Sulfonamide derivatives    Review of Systems   Review of Systems  Constitutional:  Negative for chills and fever.  HENT:  Negative for ear pain and sore throat.   Eyes:  Negative for pain and visual disturbance.  Respiratory:  Negative for cough and shortness of breath.   Cardiovascular:  Negative for chest pain and palpitations.  Gastrointestinal:  Negative for abdominal pain and vomiting.  Genitourinary:  Negative for dysuria and hematuria.  Musculoskeletal:  Negative for arthralgias and back pain.  Skin:  Negative for color change and rash.  Neurological:  Negative for seizures and syncope.  All other systems reviewed and are negative.   Physical Exam Updated Vital Signs BP (!) 159/87   Pulse 83   Temp 97.8 F (36.6 C) (Temporal)   Resp 16   SpO2 98%  Physical  Exam Vitals and nursing note reviewed.  Constitutional:      General: She is not in acute distress.    Appearance: She is well-developed.  HENT:     Head: Normocephalic and atraumatic.  Eyes:     Conjunctiva/sclera: Conjunctivae normal.  Cardiovascular:     Rate and Rhythm: Normal rate and regular rhythm.     Heart sounds: No murmur heard. Pulmonary:     Effort: Pulmonary effort is normal. No respiratory distress.     Breath sounds: Normal breath sounds.     Comments: Right chest wall with small open wound, bleeding controlled, nontender Abdominal:     Palpations: Abdomen  is soft.     Tenderness: There is no abdominal tenderness.  Musculoskeletal:        General: No swelling.     Cervical back: Neck supple.  Skin:    General: Skin is warm and dry.     Capillary Refill: Capillary refill takes less than 2 seconds.  Neurological:     Mental Status: She is alert.  Psychiatric:        Mood and Affect: Mood normal.     ED Results / Procedures / Treatments   Labs (all labs ordered are listed, but only abnormal results are displayed) Labs Reviewed  CBC WITH DIFFERENTIAL/PLATELET - Abnormal; Notable for the following components:      Result Value   RBC 2.81 (*)    Hemoglobin 9.4 (*)    HCT 28.4 (*)    MCV 101.1 (*)    Eosinophils Absolute 0.7 (*)    All other components within normal limits  COMPREHENSIVE METABOLIC PANEL - Abnormal; Notable for the following components:   Glucose, Bld 109 (*)    BUN 41 (*)    Creatinine, Ser 12.94 (*)    Total Protein 5.5 (*)    Albumin 3.3 (*)    GFR, Estimated 3 (*)    All other components within normal limits  MAGNESIUM - Abnormal; Notable for the following components:   Magnesium 2.6 (*)    All other components within normal limits  PHOSPHORUS - Abnormal; Notable for the following components:   Phosphorus 5.4 (*)    All other components within normal limits    EKG None  Radiology DG Chest Portable 1 View  Result Date: 01/08/2022 CLINICAL DATA:  Accidental hemodialysis catheter rim EXAM: PORTABLE CHEST 1 VIEW COMPARISON:  05/09/2018 FINDINGS: Single frontal view of the chest demonstrates an unremarkable cardiac silhouette. Large hiatal hernia. No airspace disease, effusion, or pneumothorax. Vascular stent left axilla. There is a dialysis catheter overlying the lower chest, likely external to the patient as it does not follow the contours of the vascular structures in the chest. Distal margin of the catheter is excluded by collimation. No other catheter fragments are identified. No acute bony abnormalities.  IMPRESSION: 1. Dialysis catheter overlying the lower chest, likely external to the patient after removal. This does not follow the normal course of vascular structures in the chest. No other radiopaque catheter fragments are identified on this exam. 2. Otherwise no acute intrathoracic process. These results were called by telephone at the time of interpretation on 01/08/2022 at 6:32 pm to provider [facility] , who verbally acknowledged these results. Electronically Signed   By: Randa Ngo M.D.   On: 01/08/2022 18:39    Procedures Procedures    Medications Ordered in ED Medications - No data to display  ED Course/ Medical Decision Making/ A&P  Medical Decision Making Amount and/or Complexity of Data Reviewed Labs: ordered. Radiology: ordered.   76 year old female with ESRD on HD through right perm cath, diabetes, HTN, HLD presenting after right perm cath fell out today. Bleeding controlled with direct pressure with EMS prior to arrival. Missed session yesterday.  Last session 3 days ago.  Patient is asymptomatic.   Differential diagnosis includes retained catheter, pneumothorax, pulmonary edema, electrolyte abnormalities, AKI, arrhythmia.   Labs overall reassuring.  Creatinine 12, however unknown baseline, but appropriate in the setting of ESRD.  Potassium within normal limits.  Mild elevation in magnesium and phosphorus. EKG reassuring.  Sinus rhythm, rate 92.  Normal axis.  QTc within normal meds at 467.  No widened QRS. Chest XR did not show retained piece of catheter, pneumothorax, or pulmonary edema.   Overall, do not feel patient needs emergent dialysis at this time.  On chart review, order was placed yesterday for new IR catheter placement.  I discussed the case with interventional radiology on-call, they do not see the patient currently on the schedule for tomorrow.  They recommended that the patient call her dialysis center first thing in the morning  and have them call to make sure she gets on the schedule appropriately.  I discussed this with the patient.  She feels comfortable with this plan and being discharged at this time.  However we discussed very strict return precautions including need to return if she develops any symptoms or new concerns.  She voiced understanding.        Final Clinical Impression(s) / ED Diagnoses Final diagnoses:  ESRD (end stage renal disease) [facility])    Rx / DC Orders ED Discharge Orders     None         Rosine Abe, MD 01/08/22 2024    Elnora Morrison, MD 01/08/22 2337

## 2022-01-09 ENCOUNTER — Encounter (HOSPITAL_COMMUNITY): Payer: Self-pay

## 2022-01-09 ENCOUNTER — Other Ambulatory Visit: Payer: Self-pay | Admitting: Nephrology

## 2022-01-09 ENCOUNTER — Ambulatory Visit (HOSPITAL_COMMUNITY)
Admission: RE | Admit: 2022-01-09 | Discharge: 2022-01-09 | Disposition: A | Discharge status: Home / Self Care | Payer: 59 | Source: Ambulatory Visit | Attending: Nephrology | Admitting: Nephrology

## 2022-01-09 DIAGNOSIS — N186 End stage renal disease: Secondary | ICD-10-CM

## 2022-01-09 DIAGNOSIS — Z992 Dependence on renal dialysis: Secondary | ICD-10-CM | POA: Insufficient documentation

## 2022-01-09 HISTORY — PX: IR FLUORO GUIDE CV LINE RIGHT: IMG2283

## 2022-01-09 HISTORY — PX: IR US GUIDE VASC ACCESS RIGHT: IMG2390

## 2022-01-09 MED ORDER — MIDAZOLAM HCL 2 MG/2ML IJ SOLN
INTRAMUSCULAR | Status: AC | PRN
  Administered 2022-01-09: 1 mg via INTRAVENOUS

## 2022-01-09 MED ORDER — HEPARIN SODIUM (PORCINE) 1000 UNIT/ML IJ SOLN
INTRAMUSCULAR | Status: AC | PRN
  Administered 2022-01-09: 2.4 [IU] via INTRAVENOUS

## 2022-01-09 MED ORDER — FENTANYL CITRATE (PF) 100 MCG/2ML IJ SOLN
INTRAMUSCULAR | Status: AC
  Filled 2022-01-09: qty 2

## 2022-01-09 MED ORDER — LIDOCAINE-EPINEPHRINE 2 %-1:100000 IJ SOLN
INTRAMUSCULAR | Status: AC | PRN
  Administered 2022-01-09: 10 mL

## 2022-01-09 MED ORDER — FENTANYL CITRATE (PF) 100 MCG/2ML IJ SOLN
INTRAMUSCULAR | Status: AC | PRN
  Administered 2022-01-09: 50 ug via INTRAVENOUS

## 2022-01-09 MED ORDER — CEFAZOLIN SODIUM-DEXTROSE 2-4 GM/100ML-% IV SOLN
INTRAVENOUS | Status: AC
  Filled 2022-01-09: qty 100

## 2022-01-09 MED ORDER — GELATIN ABSORBABLE 12-7 MM EX MISC
CUTANEOUS | Status: AC
  Filled 2022-01-09: qty 1

## 2022-01-09 MED ORDER — MIDAZOLAM HCL 2 MG/2ML IJ SOLN
INTRAMUSCULAR | Status: AC
  Filled 2022-01-09: qty 2

## 2022-01-09 MED ORDER — LIDOCAINE-EPINEPHRINE 1 %-1:100000 IJ SOLN
INTRAMUSCULAR | Status: AC
  Filled 2022-01-09: qty 1

## 2022-01-09 MED ORDER — HEPARIN SODIUM (PORCINE) 1000 UNIT/ML IJ SOLN
INTRAMUSCULAR | Status: AC
  Filled 2022-01-09: qty 10

## 2022-01-09 MED ORDER — CEFAZOLIN SODIUM-DEXTROSE 1-4 GM/50ML-% IV SOLN
1.0000 g | INTRAVENOUS | Status: DC
  Filled 2022-01-09: qty 50

## 2022-01-09 MED ORDER — CEFAZOLIN SODIUM-DEXTROSE 2-4 GM/100ML-% IV SOLN
INTRAVENOUS | Status: AC | PRN
  Administered 2022-01-09: 2 g via INTRAVENOUS

## 2022-01-09 NOTE — Progress Notes (Signed)
PHARMACY NOTE:  ANTIMICROBIAL RENAL DOSAGE ADJUSTMENT  Current antimicrobial regimen includes a mismatch between antimicrobial dosage and estimated renal function.  As per policy approved by the Pharmacy & Therapeutics and Medical Executive Committees, the antimicrobial dosage will be adjusted accordingly.  Current antimicrobial dosage:  cefazolin 2gm IV q8h  Indication: surgical prophylaxis  Renal Function:  Estimated Creatinine Clearance: 3.8 mL/min (A) (by C-G formula based on SCr of 12.94 mg/dL (H)). '[]'$      On intermittent HD, scheduled: '[]'$      On CRRT    Antimicrobial dosage has been changed to:  cefazolin 1gm Iv q24  Additional comments:   Maronda Caison A. Levada Dy, PharmD, BCPS, FNKF Clinical Pharmacist [place] Please utilize Amion for appropriate phone number to reach the unit pharmacist ([place])  01/09/2022 1:38 PM

## 2022-01-09 NOTE — H&P (Signed)
Chief Complaint: Patient was seen in consultation today for tunneled catheter placement  at the request of Lynnda Child  Referring Physician(s): Lynnda Child  Supervising Physician: Juliet Rude  Patient Status: [facility] - Out-pt  History of Present Illness: [name] is a 76 y.o. female with ESRD on HD. Pt presented today for tunneled hemodialysis catheter placement after her right chest tunneled catheter was inadvertently pulled-out. Pt was referred to IR by Lynnda Child, PA.   Past Medical History:  Diagnosis Date   Anemia    Anxiety    Arthritis    lower back and knees    Asthma    childhood   Barrett's esophagus    Chronic kidney disease    Coagulation defect (HCC)    Depression    Diabetes mellitus    Dysphagia    Eczema    End stage renal disease (HCC)    Fluid overload, unspecified    History of herpes zoster 02/2010   Recovered fully after period of acute herpetic neuralgia tx w/ gabapentin.    Hx MRSA infection 2009   Hypercalcemia    Hyperlipidemia    Hypertension    Moderate protein-calorie malnutrition (HCC)    Neuromuscular disorder ([place])    chronic pain   Personal history of colonic polyps 10/17/2010   hyperplastic    Past Surgical History:  Procedure Laterality Date   ABDOMINAL HYSTERECTOMY     unclear when   AV FISTULA PLACEMENT Left 02/13/2017   Procedure: ARTERIOVENOUS (AV) GORE-TEX STRETCH GRAFT INSERTION INTO LEFT ARM;  Surgeon: Elam Dutch, MD;  Location: [place];  Service: Vascular;  Laterality: Left;   CATARACT EXTRACTION Bilateral    COLONOSCOPY     OTHER SURGICAL HISTORY     Catherer placed by [facility], right upper chest area.   thigh surg     left side due to MRSA   UPPER GASTROINTESTINAL ENDOSCOPY      Allergies: Lisinopril, Pioglitazone, Morphine, Other, Morphine and related, and Sulfonamide derivatives  Medications: Prior to Admission medications   Medication Sig Start Date End Date  Taking? Authorizing Provider  aspirin 81 MG chewable tablet Chew 1 tablet (81 mg total) by mouth daily. 05/12/17  Yes Regalado, Belkys A, MD  B Complex-C-Zn-Folic Acid (DIALYVITE 812 WITH ZINC) 0.8 MG TABS Take 1 tablet by mouth Every Tuesday,Thursday,and Saturday with dialysis. 07/01/21  Yes [provider]  calcium acetate (PHOSLO) 667 MG capsule Take by mouth 3 (three) times daily with meals.   Yes [provider]  cinacalcet (SENSIPAR) 30 MG tablet Take 30 mg by mouth daily. 12/06/17  Yes [provider]  clopidogrel (PLAVIX) 75 MG tablet TAKE 1 TABLET BY MOUTH EVERY DAY 09/08/21  Yes Ngetich, Dinah C, NP  ezetimibe (ZETIA) 10 MG tablet TAKE 1 TABLET BY MOUTH EVERY DAY 07/21/21  Yes [name], NP  pantoprazole (PROTONIX) 40 MG tablet TAKE 1 TABLET BY MOUTH EVERY DAY 05/18/21  Yes Zehr, Janett Billow D, PA-C  pravastatin (PRAVACHOL) 40 MG tablet TAKE 1 TABLET BY MOUTH EVERYDAY AT BEDTIME 07/21/21  Yes [name], NP  sevelamer carbonate (RENVELA) 2.4 g PACK 2.4 g 3 (three) times daily with meals.   Yes [provider]  traZODone (DESYREL) 100 MG tablet TAKE 1 TABLET BY MOUTH EVERYDAY AT BEDTIME 11/28/21  Yes Eubanks, Carlos American, NP  triamcinolone cream (KENALOG) 0.1 % APPLY 1 APPLICATION TOPICALLY 3 (THREE) TIMES DAILY AS NEEDED (RASH). 11/14/19  Yes [name]  K, NP  acetaminophen (TYLENOL) 500 MG tablet Take 1 tablet (500 mg total) by mouth every 6 (six) hours as needed. 06/17/21   Ngetich, Dinah C, NP  Methoxy PEG-Epoetin Beta (MIRCERA IJ) Inject 75 mcg as directed every 14 (fourteen) days. 07/07/21 07/06/22  [provider]  PARoxetine (PAXIL) 40 MG tablet TAKE 1 TABLET BY MOUTH EVERY DAY 09/08/21   Ngetich, Nelda Bucks, NP     Family History  Problem Relation Age of Onset   Hypertension Father    Kidney disease Father    Diabetes Brother    Colon cancer Neg Hx    CAD Neg Hx    Rectal cancer Neg Hx    Stomach cancer Neg Hx     Social  History   Socioeconomic History   Marital status: Divorced    Spouse name: Not on file   Number of children: 0   Years of education: Not on file   Highest education level: Not on file  Occupational History   Occupation: Retired    Fish farm manager: SELF-EMPLOYED  Tobacco Use   Smoking status: Every Day    Packs/day: 0.50    Years: 40.00    Total pack years: 20.00    Types: Cigarettes   Smokeless tobacco: Never   Tobacco comments:    smokes 1 pack of cigerettes a week- off and on  Vaping Use   Vaping Use: Never used  Substance and Sexual Activity   Alcohol use: Yes    Alcohol/week: 2.0 standard drinks of alcohol    Types: 2 Standard drinks or equivalent per week    Comment: wine and beer- occasionally    Drug use: No   Sexual activity: Never  Other Topics Concern   Not on file  Social History Narrative   Financial assistance approved for 100% discount at [facility] and has [facility] card per Bonna Gains   02/10/2010      3 caffeine drinks daily    Social Determinants of Health   Financial Resource Strain: Low Risk  (01/30/2018)   Overall Financial Resource Strain (CARDIA)    Difficulty of Paying Living Expenses: Not hard at all  Food Insecurity: No Food Insecurity (01/30/2018)   Hunger Vital Sign    Worried About Running Out of Food in the Last Year: Never true    Ran Out of Food in the Last Year: Never true  Transportation Needs: No Transportation Needs (01/30/2018)   PRAPARE - Hydrologist (Medical): No    Lack of Transportation (Non-Medical): No  Physical Activity: Sufficiently Active (01/30/2018)   Exercise Vital Sign    Days of Exercise per Week: 7 days    Minutes of Exercise per Session: 30 min  Stress: Stress Concern Present (01/30/2018)   [place]    Feeling of Stress : To some extent  Social Connections: Moderately Isolated (01/30/2018)   Social Connection and Isolation Panel  [NHANES]    Frequency of Communication with Friends and Family: More than three times a week    Frequency of Social Gatherings with Friends and Family: More than three times a week    Attends Religious Services: Never    Marine scientist or Organizations: No    Attends Archivist Meetings: Never    Marital Status: Divorced     Review of Systems: A 12 point ROS discussed and pertinent positives are indicated in the HPI above.  All other  systems are negative.  Review of Systems  All other systems reviewed and are negative.   Vital Signs: BP (!) 180/95   Temp (!) 97.5 F (36.4 C)   Resp 16   Ht '5\' 5"'$  (1.651 m)   Wt 177 lb (80.3 kg)   SpO2 98%   BMI 29.45 kg/m     Physical Exam Vitals reviewed.  Constitutional:      General: She is not in acute distress.    Appearance: She is not ill-appearing.  HENT:     Head: Normocephalic and atraumatic.     Mouth/Throat:     Mouth: Mucous membranes are dry.     Pharynx: Oropharynx is clear.  Eyes:     Extraocular Movements: Extraocular movements intact.     Pupils: Pupils are equal, round, and reactive to light.  Cardiovascular:     Rate and Rhythm: Normal rate and regular rhythm.     Pulses: Normal pulses.     Heart sounds: Normal heart sounds.  Pulmonary:     Effort: Pulmonary effort is normal. No respiratory distress.     Breath sounds: Normal breath sounds.  Skin:    General: Skin is warm and dry.     Comments: Dressing applied to R upper chest at previous tunneled catheter site  Neurological:     Mental Status: She is alert and oriented to person, place, and time.  Psychiatric:        Mood and Affect: Mood normal.        Behavior: Behavior normal.        Thought Content: Thought content normal.        Judgment: Judgment normal.     Imaging: DG Chest Portable 1 View  Result Date: 01/08/2022 CLINICAL DATA:  Accidental hemodialysis catheter rim EXAM: PORTABLE CHEST 1 VIEW COMPARISON:  05/09/2018  FINDINGS: Single frontal view of the chest demonstrates an unremarkable cardiac silhouette. Large hiatal hernia. No airspace disease, effusion, or pneumothorax. Vascular stent left axilla. There is a dialysis catheter overlying the lower chest, likely external to the patient as it does not follow the contours of the vascular structures in the chest. Distal margin of the catheter is excluded by collimation. No other catheter fragments are identified. No acute bony abnormalities. IMPRESSION: 1. Dialysis catheter overlying the lower chest, likely external to the patient after removal. This does not follow the normal course of vascular structures in the chest. No other radiopaque catheter fragments are identified on this exam. 2. Otherwise no acute intrathoracic process. These results were called by telephone at the time of interpretation on 01/08/2022 at 6:32 pm to provider [facility] , who verbally acknowledged these results. Electronically Signed   By: Randa Ngo M.D.   On: 01/08/2022 18:39   VAS [place] UPPER EXTREMITY ARTERIAL DUPLEX  Result Date: 12/29/2021  UPPER EXTREMITY DUPLEX STUDY Patient Name:  CORDELIA BESSINGER  Date of Exam:   12/29/2021 Medical Rec #: [id number]        Accession #:    [id number] Date of Birth: Jan 07, 1946        Patient Gender: F Patient Age:   61 years Exam Location:  [facility] Procedure:      VAS [place] UPPER EXTREMITY ARTERIAL DUPLEX Referring Phys: Harrell Gave DICKSON --------------------------------------------------------------------------------  Indications: Pre-operative exam.  Performing Technologist: Alvia Grove RVT  Examination Guidelines: A complete evaluation includes B-mode imaging, spectral Doppler, color Doppler, and power Doppler as needed of all accessible portions of each vessel. Bilateral  testing is considered an integral part of a complete examination. Limited examinations for reoccurring indications may be performed as noted.  Right Pre-Dialysis  Findings: +-----------------------+----------+--------------------+---------+--------+ Location               PSV (cm/s)Intralum. Diam. (cm)Waveform Comments +-----------------------+----------+--------------------+---------+--------+ Brachial Antecub. fossa69        0.50                triphasic         +-----------------------+----------+--------------------+---------+--------+ Radial Art at Wrist    90        0.27                triphasic         +-----------------------+----------+--------------------+---------+--------+ Ulnar Art at Wrist     68        0.23                triphasic         +-----------------------+----------+--------------------+---------+--------+  Left Pre-Dialysis Findings: +-----------------------+----------+--------------------+---------+--------+ Location               PSV (cm/s)Intralum. Diam. (cm)Waveform Comments +-----------------------+----------+--------------------+---------+--------+ Brachial Antecub. fossa39        0.55                triphasic         +-----------------------+----------+--------------------+---------+--------+ Radial Art at Wrist    73        0.27                triphasic         +-----------------------+----------+--------------------+---------+--------+ Ulnar Art at Wrist     67        0.23                triphasic         +-----------------------+----------+--------------------+---------+--------+  Summary:   Measurements above. *See table(s) above for measurements and observations. Electronically signed by Deitra Mayo MD on 12/29/2021 at 3:08:16 PM.    Final    VAS [place] UPPER EXT VEIN MAPPING (PRE-OP AVF)  Result Date: 12/29/2021 UPPER EXTREMITY VEIN MAPPING Patient Name:  BILLIE TRAGER  Date of Exam:   12/29/2021 Medical Rec #: [id number]        Accession #:    [id number] Date of Birth: 1946/01/05        Patient Gender: F Patient Age:   76 years Exam Location:  [facility] Procedure:       VAS [place] UPPER EXT VEIN MAPPING (PRE-OP AVF) Referring Phys: Harrell Gave DICKSON --------------------------------------------------------------------------------  Indications: Pre-access. History: Left upper arm AV graft 02/13/2017.  Performing Technologist: Alvia Grove RVT  Examination Guidelines: A complete evaluation includes B-mode imaging, spectral Doppler, color Doppler, and power Doppler as needed of all accessible portions of each vessel. Bilateral testing is considered an integral part of a complete examination. Limited examinations for reoccurring indications may be performed as noted. +-----------------+-------------+----------+---------+ Right Cephalic   Diameter (cm)Depth (cm)Findings  +-----------------+-------------+----------+---------+ Shoulder             0.27                         +-----------------+-------------+----------+---------+ Prox upper arm       0.32                         +-----------------+-------------+----------+---------+ Mid upper arm        0.34                         +-----------------+-------------+----------+---------+  Dist upper arm       0.44                         +-----------------+-------------+----------+---------+ Antecubital fossa    0.41                         +-----------------+-------------+----------+---------+ Prox forearm      0.38 / 0.23                     +-----------------+-------------+----------+---------+ Mid forearm          0.28               branching +-----------------+-------------+----------+---------+ Dist forearm      0.24 / 0.27                     +-----------------+-------------+----------+---------+ +-----------------+-------------+----------+---------+ Right Basilic    Diameter (cm)Depth (cm)Findings  +-----------------+-------------+----------+---------+ Prox upper arm       0.24                         +-----------------+-------------+----------+---------+ Mid upper arm     0.28 /  0.29                     +-----------------+-------------+----------+---------+ Dist upper arm       0.33                         +-----------------+-------------+----------+---------+ Antecubital fossa    0.32               branching +-----------------+-------------+----------+---------+ Prox forearm         0.36                         +-----------------+-------------+----------+---------+ +-----------------+------------------+----------+---------+ Left Cephalic      Diameter (cm)   Depth (cm)Findings  +-----------------+------------------+----------+---------+ Shoulder                0.29                           +-----------------+------------------+----------+---------+ Prox upper arm          0.27                           +-----------------+------------------+----------+---------+ Mid upper arm           0.32                           +-----------------+------------------+----------+---------+ Antecubital fossa0.25 / 0.44 / 0.34          joins br  +-----------------+------------------+----------+---------+ Prox forearm            0.16                           +-----------------+------------------+----------+---------+ Mid forearm         0.32 / 0.18              branching +-----------------+------------------+----------+---------+ Dist forearm            0.26                           +-----------------+------------------+----------+---------+ +-----------------+-------------+----------+---------+ Left Basilic  Diameter (cm)Depth (cm)Findings  +-----------------+-------------+----------+---------+ Prox upper arm       0.32                         +-----------------+-------------+----------+---------+ Mid upper arm        0.40                         +-----------------+-------------+----------+---------+ Dist upper arm       0.37               branching +-----------------+-------------+----------+---------+ Antecubital fossa     0.24                         +-----------------+-------------+----------+---------+ Prox forearm         0.28                         +-----------------+-------------+----------+---------+ Summary:   Measurements above. *See table(s) above for measurements and observations.  Diagnosing physician: Deitra Mayo MD Electronically signed by Deitra Mayo MD on 12/29/2021 at 3:08:03 PM.    Final     Labs:  CBC: Recent Labs    04/24/21 0000 05/25/21 0000 01/08/22 1821  WBC  --   --  8.1  HGB 10.1* 10.0* 9.4*  HCT  --   --  28.4*  PLT  --   --  154    COAGS: No results for input(s): "INR", "APTT" in the last 8760 hours.  BMP: Recent Labs    04/24/21 0000 05/25/21 0000 01/08/22 1821  NA  --   --  140  K 4.2 3.8 4.2  CL  --   --  98  CO2  --   --  28  GLUCOSE  --   --  109*  BUN  --   --  41*  CALCIUM 9.4 9.4 9.2  CREATININE  --   --  12.94*  GFRNONAA  --   --  3*    LIVER FUNCTION TESTS: Recent Labs    04/24/21 0000 05/25/21 0000 06/10/21 1112 01/08/22 1821  BILITOT  --   --  0.4 0.6  AST  --   --  19 32  ALT  --   --  29 21  ALKPHOS  --   --   --  72  PROT  --   --  6.6 5.5*  ALBUMIN 3.9 4.2  --  3.3*    TUMOR MARKERS: No results for input(s): "AFPTM", "CEA", "CA199", "CHROMGRNA" in the last 8760 hours.  Assessment and Plan: 76 yo female with ESRD on HD presents to IR for tunneled catheter placement after her line was inadvertently removed.   Pt is A&O.  She is in no distress.  Pt states she has not eaten since yesterday.  She denies drinking fluids today.  Pt is agitated and states she just wants to "get this over with so I can leave and get something to eat."  Pt reports her brother brought her today and will taking her home.   Risks and benefits discussed with the patient including, but not limited to bleeding, infection, vascular injury, pneumothorax which may require chest tube placement, air embolism or even death  All of the  patient's questions were answered, patient is agreeable to proceed. Consent signed and in chart.   Thank you for this interesting consult.  I greatly enjoyed meeting [name] and look forward to participating in their care.  A copy of this report was sent to the requesting provider on this date.  Electronically Signed: Tyson Alias, NP 01/09/2022, 2:41 PM   I spent a total of 20 minutes in face to face in clinical consultation, greater than 50% of which was counseling/coordinating care for tunneled HD catheter placement.

## 2022-01-09 NOTE — Sedation Documentation (Addendum)
Patient discharged home. Patient alert and oriented x 4. IV removed. Patient taken out via wheelchair. Iona Beard (brother) here to taken patient home and watch overnight. Patient to return to dialysis tomorrow.

## 2022-01-09 NOTE — Procedures (Signed)
Interventional Radiology Procedure Note  Procedure: RT IJ TUNNELED HD CATH    Complications: None  Estimated Blood Loss:  MIN  Findings: TIP SVCRA    M. TREVOR Marda Breidenbach, MD    

## 2022-01-16 ENCOUNTER — Other Ambulatory Visit: Payer: Self-pay

## 2022-01-16 ENCOUNTER — Encounter (HOSPITAL_COMMUNITY): Payer: Self-pay | Admitting: Vascular Surgery

## 2022-01-16 ENCOUNTER — Encounter (HOSPITAL_COMMUNITY)
Admission: RE | Disposition: A | Discharge status: Home / Self Care | Payer: Self-pay | Source: Home / Self Care | Attending: Vascular Surgery

## 2022-01-16 ENCOUNTER — Ambulatory Visit (HOSPITAL_COMMUNITY)
Admission: RE | Admit: 2022-01-16 | Discharge: 2022-01-16 | Disposition: A | Discharge status: Home / Self Care | Payer: 59 | Attending: Vascular Surgery | Admitting: Vascular Surgery

## 2022-01-16 ENCOUNTER — Ambulatory Visit (HOSPITAL_BASED_OUTPATIENT_CLINIC_OR_DEPARTMENT_OTHER): Payer: 59 | Admitting: Anesthesiology

## 2022-01-16 ENCOUNTER — Ambulatory Visit (HOSPITAL_COMMUNITY): Payer: 59 | Admitting: Anesthesiology

## 2022-01-16 DIAGNOSIS — N186 End stage renal disease: Secondary | ICD-10-CM | POA: Diagnosis not present

## 2022-01-16 DIAGNOSIS — T82868A Thrombosis of vascular prosthetic devices, implants and grafts, initial encounter: Secondary | ICD-10-CM | POA: Diagnosis not present

## 2022-01-16 DIAGNOSIS — Z79899 Other long term (current) drug therapy: Secondary | ICD-10-CM | POA: Diagnosis not present

## 2022-01-16 DIAGNOSIS — Z992 Dependence on renal dialysis: Secondary | ICD-10-CM | POA: Insufficient documentation

## 2022-01-16 DIAGNOSIS — E1122 Type 2 diabetes mellitus with diabetic chronic kidney disease: Secondary | ICD-10-CM | POA: Diagnosis not present

## 2022-01-16 DIAGNOSIS — D631 Anemia in chronic kidney disease: Secondary | ICD-10-CM | POA: Diagnosis not present

## 2022-01-16 DIAGNOSIS — Z7902 Long term (current) use of antithrombotics/antiplatelets: Secondary | ICD-10-CM | POA: Diagnosis not present

## 2022-01-16 DIAGNOSIS — Y832 Surgical operation with anastomosis, bypass or graft as the cause of abnormal reaction of the patient, or of later complication, without mention of misadventure at the time of the procedure: Secondary | ICD-10-CM | POA: Insufficient documentation

## 2022-01-16 DIAGNOSIS — F419 Anxiety disorder, unspecified: Secondary | ICD-10-CM | POA: Insufficient documentation

## 2022-01-16 DIAGNOSIS — F32A Depression, unspecified: Secondary | ICD-10-CM | POA: Insufficient documentation

## 2022-01-16 DIAGNOSIS — I120 Hypertensive chronic kidney disease with stage 5 chronic kidney disease or end stage renal disease: Secondary | ICD-10-CM | POA: Insufficient documentation

## 2022-01-16 DIAGNOSIS — F17210 Nicotine dependence, cigarettes, uncomplicated: Secondary | ICD-10-CM | POA: Diagnosis not present

## 2022-01-16 DIAGNOSIS — K219 Gastro-esophageal reflux disease without esophagitis: Secondary | ICD-10-CM | POA: Diagnosis not present

## 2022-01-16 DIAGNOSIS — N185 Chronic kidney disease, stage 5: Secondary | ICD-10-CM | POA: Diagnosis not present

## 2022-01-16 HISTORY — PX: AV FISTULA PLACEMENT: SHX1204

## 2022-01-16 LAB — GLUCOSE, CAPILLARY
Glucose-Capillary: 100 mg/dL — ABNORMAL HIGH (ref 70–99)
Glucose-Capillary: 94 mg/dL (ref 70–99)
Glucose-Capillary: 117 mg/dL — ABNORMAL HIGH (ref 70–99)
Glucose-Capillary: 76 mg/dL (ref 70–99)

## 2022-01-16 LAB — POCT I-STAT, CHEM 8
BUN: 39 mg/dL — ABNORMAL HIGH (ref 8–23)
Calcium, Ion: 1.15 mmol/L (ref 1.15–1.40)
Chloride: 101 mmol/L (ref 98–111)
Creatinine, Ser: 10.8 mg/dL — ABNORMAL HIGH (ref 0.44–1.00)
Glucose, Bld: 85 mg/dL (ref 70–99)
HCT: 30 % — ABNORMAL LOW (ref 36.0–46.0)
Hemoglobin: 10.2 g/dL — ABNORMAL LOW (ref 12.0–15.0)
Potassium: 4.8 mmol/L (ref 3.5–5.1)
Sodium: 139 mmol/L (ref 135–145)
TCO2: 29 mmol/L (ref 22–32)

## 2022-01-16 SURGERY — ARTERIOVENOUS (AV) FISTULA CREATION
Anesthesia: Monitor Anesthesia Care | Site: Arm Upper | Laterality: Right

## 2022-01-16 MED ORDER — ROPIVACAINE HCL 5 MG/ML IJ SOLN
INTRAMUSCULAR | Status: DC | PRN
  Administered 2022-01-16: 30 mL via PERINEURAL

## 2022-01-16 MED ORDER — 0.9 % SODIUM CHLORIDE (POUR BTL) OPTIME
TOPICAL | Status: DC | PRN
  Administered 2022-01-16: 1000 mL

## 2022-01-16 MED ORDER — SODIUM CHLORIDE 0.9 % IV SOLN: INTRAVENOUS | Status: DC

## 2022-01-16 MED ORDER — HEPARIN SODIUM (PORCINE) 1000 UNIT/ML IJ SOLN
INTRAMUSCULAR | Status: AC
  Filled 2022-01-16: qty 10

## 2022-01-16 MED ORDER — FENTANYL CITRATE PF 50 MCG/ML IJ SOSY: 50.0000 ug | PREFILLED_SYRINGE | Freq: Once | INTRAMUSCULAR | Status: DC

## 2022-01-16 MED ORDER — CHLORHEXIDINE GLUCONATE 4 % EX LIQD: 60.0000 mL | Freq: Once | CUTANEOUS | Status: DC

## 2022-01-16 MED ORDER — LIDOCAINE-EPINEPHRINE (PF) 1 %-1:200000 IJ SOLN
INTRAMUSCULAR | Status: AC
  Filled 2022-01-16: qty 30

## 2022-01-16 MED ORDER — FENTANYL CITRATE (PF) 100 MCG/2ML IJ SOLN
INTRAMUSCULAR | Status: AC
  Administered 2022-01-16: 100 ug
  Filled 2022-01-16: qty 2

## 2022-01-16 MED ORDER — FENTANYL CITRATE PF 50 MCG/ML IJ SOSY
50.0000 ug | PREFILLED_SYRINGE | Freq: Once | INTRAMUSCULAR | Status: AC
  Administered 2022-01-16: 50 ug via INTRAVENOUS
  Filled 2022-01-16: qty 1

## 2022-01-16 MED ORDER — ORAL CARE MOUTH RINSE: 15.0000 mL | Freq: Once | OROMUCOSAL | Status: AC

## 2022-01-16 MED ORDER — HEPARIN 6000 UNIT IRRIGATION SOLUTION
Status: AC
  Filled 2022-01-16: qty 500

## 2022-01-16 MED ORDER — CHLORHEXIDINE GLUCONATE 0.12 % MT SOLN
OROMUCOSAL | Status: AC
  Administered 2022-01-16: 15 mL via OROMUCOSAL
  Filled 2022-01-16: qty 15

## 2022-01-16 MED ORDER — CHLORHEXIDINE GLUCONATE 0.12 % MT SOLN: 15.0000 mL | Freq: Once | OROMUCOSAL | Status: AC

## 2022-01-16 MED ORDER — PROTAMINE SULFATE 10 MG/ML IV SOLN
INTRAVENOUS | Status: AC
  Filled 2022-01-16: qty 10

## 2022-01-16 MED ORDER — MIDAZOLAM HCL 2 MG/2ML IJ SOLN
INTRAMUSCULAR | Status: AC
  Filled 2022-01-16: qty 2

## 2022-01-16 MED ORDER — OXYCODONE-ACETAMINOPHEN 5-325 MG PO TABS: 1.0000 | ORAL_TABLET | Freq: Four times a day (QID) | ORAL | 0 refills | Status: DC | PRN

## 2022-01-16 MED ORDER — HEPARIN 6000 UNIT IRRIGATION SOLUTION
Status: DC | PRN
  Administered 2022-01-16: 1

## 2022-01-16 MED ORDER — PHENYLEPHRINE 80 MCG/ML (10ML) SYRINGE FOR IV PUSH (FOR BLOOD PRESSURE SUPPORT)
PREFILLED_SYRINGE | INTRAVENOUS | Status: DC | PRN
  Administered 2022-01-16 (×6): 80 ug via INTRAVENOUS

## 2022-01-16 MED ORDER — PHENYLEPHRINE HCL-NACL 20-0.9 MG/250ML-% IV SOLN
INTRAVENOUS | Status: DC | PRN
  Administered 2022-01-16: 15 ug/min via INTRAVENOUS

## 2022-01-16 MED ORDER — CEFAZOLIN SODIUM-DEXTROSE 2-4 GM/100ML-% IV SOLN
2.0000 g | INTRAVENOUS | Status: AC
  Administered 2022-01-16: 2 g via INTRAVENOUS
  Filled 2022-01-16: qty 100

## 2022-01-16 MED ORDER — PROTAMINE SULFATE 10 MG/ML IV SOLN
INTRAVENOUS | Status: DC | PRN
  Administered 2022-01-16: 40 mg via INTRAVENOUS

## 2022-01-16 MED ORDER — PROPOFOL 500 MG/50ML IV EMUL
INTRAVENOUS | Status: DC | PRN
  Administered 2022-01-16: 65 ug/kg/min via INTRAVENOUS

## 2022-01-16 MED ORDER — HEPARIN SODIUM (PORCINE) 1000 UNIT/ML IJ SOLN
INTRAMUSCULAR | Status: DC | PRN
  Administered 2022-01-16: 8000 [IU] via INTRAVENOUS

## 2022-01-16 MED ORDER — PROPOFOL 10 MG/ML IV BOLUS
INTRAVENOUS | Status: DC | PRN
  Administered 2022-01-16: 10 mg via INTRAVENOUS

## 2022-01-16 SURGICAL SUPPLY — 34 items
ADH SKN CLS APL DERMABOND .7 (GAUZE/BANDAGES/DRESSINGS) ×1
ARMBAND PINK RESTRICT EXTREMIT (MISCELLANEOUS) ×2 IMPLANT
BAG COUNTER SPONGE SURGICOUNT (BAG) ×1 IMPLANT
BAG SPNG CNTER NS LX DISP (BAG) ×1
CANISTER SUCT 3000ML PPV (MISCELLANEOUS) ×1 IMPLANT
CANNULA VESSEL 3MM 2 BLNT TIP (CANNULA) ×1 IMPLANT
CLIP VESOCCLUDE MED 6/CT (CLIP) ×1 IMPLANT
CLIP VESOCCLUDE SM WIDE 6/CT (CLIP) ×1 IMPLANT
COVER PROBE W GEL 5X96 (DRAPES) IMPLANT
DERMABOND ADVANCED .7 DNX12 (GAUZE/BANDAGES/DRESSINGS) ×1 IMPLANT
ELECT REM PT RETURN 9FT ADLT (ELECTROSURGICAL) ×1
ELECTRODE REM PT RTRN 9FT ADLT (ELECTROSURGICAL) ×1 IMPLANT
GLOVE BIO SURGEON STRL SZ 6.5 (GLOVE) IMPLANT
GLOVE BIO SURGEON STRL SZ7.5 (GLOVE) ×1 IMPLANT
GLOVE BIOGEL PI IND STRL 6.5 (GLOVE) IMPLANT
GLOVE BIOGEL PI IND STRL 8 (GLOVE) ×1 IMPLANT
GLOVE ECLIPSE 6.5 STRL STRAW (GLOVE) IMPLANT
GLOVE SURG POLY ORTHO LF SZ7.5 (GLOVE) IMPLANT
GLOVE SURG UNDER LTX SZ8 (GLOVE) ×1 IMPLANT
GOWN STRL REUS W/ TWL LRG LVL3 (GOWN DISPOSABLE) ×3 IMPLANT
GOWN STRL REUS W/TWL LRG LVL3 (GOWN DISPOSABLE) ×3
KIT BASIN OR (CUSTOM PROCEDURE TRAY) ×1 IMPLANT
KIT TURNOVER KIT B (KITS) ×1 IMPLANT
NS IRRIG 1000ML POUR BTL (IV SOLUTION) ×1 IMPLANT
PACK CV ACCESS (CUSTOM PROCEDURE TRAY) ×1 IMPLANT
PAD ARMBOARD 7.5X6 YLW CONV (MISCELLANEOUS) ×2 IMPLANT
SPIKE FLUID TRANSFER (MISCELLANEOUS) ×1 IMPLANT
SUT MNCRL AB 4-0 PS2 18 (SUTURE) ×1 IMPLANT
SUT PROLENE 6 0 BV (SUTURE) ×1 IMPLANT
SUT VIC AB 3-0 SH 27 (SUTURE) ×1
SUT VIC AB 3-0 SH 27X BRD (SUTURE) ×1 IMPLANT
TOWEL GREEN STERILE (TOWEL DISPOSABLE) ×1 IMPLANT
UNDERPAD 30X36 HEAVY ABSORB (UNDERPADS AND DIAPERS) ×1 IMPLANT
WATER STERILE IRR 1000ML POUR (IV SOLUTION) ×1 IMPLANT

## 2022-01-16 NOTE — Discharge Instructions (Signed)
   [facility]  Discharge Instructions  AV Fistula or Graft Surgery for Dialysis Access  Please refer to the following instructions for your post-procedure care. Your surgeon or physician assistant will discuss any changes with you.  Activity  You may drive the day following your surgery, if you are comfortable and no longer taking prescription pain medication. Resume full activity as the soreness in your incision resolves.  Bathing/Showering  You may shower after you go home. Keep your incision dry for 48 hours. Do not soak in a bathtub, hot tub, or swim until the incision heals completely. You may not shower if you have a hemodialysis catheter.  Incision Care  Clean your incision with mild soap and water after 48 hours. Pat the area dry with a clean towel. You do not need a bandage unless otherwise instructed. Do not apply any ointments or creams to your incision. You may have skin glue on your incision. Do not peel it off. It will come off on its own in about one week. Your arm may swell a bit after surgery. To reduce swelling use pillows to elevate your arm so it is above your heart. Your doctor will tell you if you need to lightly wrap your arm with an ACE bandage.  Diet  Resume your normal diet. There are not special food restrictions following this procedure. In order to heal from your surgery, it is CRITICAL to get adequate nutrition. Your body requires vitamins, minerals, and protein. Vegetables are the best source of vitamins and minerals. Vegetables also provide the perfect balance of protein. Processed food has little nutritional value, so try to avoid this.  Medications  Resume taking all of your medications. If your incision is causing pain, you may take over-the counter pain relievers such as acetaminophen (Tylenol). If you were prescribed a stronger pain medication, please be aware these medications can cause nausea and constipation. Prevent  nausea by taking the medication with a snack or meal. Avoid constipation by drinking plenty of fluids and eating foods with high amount of fiber, such as fruits, vegetables, and grains.  Do not take Tylenol if you are taking prescription pain medications.  Follow up Your surgeon may want to see you in the office following your access surgery. If so, this will be arranged at the time of your surgery.  Please call us immediately for any of the following conditions:  Increased pain, redness, drainage (pus) from your incision site Fever of 101 degrees or higher Severe or worsening pain at your incision site Hand pain or numbness.  Reduce your risk of vascular disease:  Stop smoking. If you would like help, call [facility] at 1-800-QUIT-NOW 360-494-6066) or [facility] [place] your cholesterol Maintain a desired weight Control your diabetes Keep your blood pressure down  Dialysis  It will take several weeks to several months for your new dialysis access to be ready for use. Your surgeon will determine when it is okay to use it. Your nephrologist will continue to direct your dialysis. You can continue to use your Permcath until your new access is ready for use.   01/16/2022 AILSA MIRELES [id number] 1945/05/26  Surgeon(s): Angelia Mould, MD  Procedure(s): Creation of right brachiocephalic AV fistula  x Do not stick fistula for 12 weeks    If you have any questions, please call the office at [phone].

## 2022-01-16 NOTE — Transfer of Care (Signed)
Immediate Anesthesia Transfer of Care Note  Patient: [name]  Procedure(s) Performed: RIGHT ARM ARTERIOVENOUS (AV) FISTULA (Right: Arm Upper)  Patient Location: PACU  Anesthesia Type:MAC combined with regional for post-op pain  Level of Consciousness: awake  Airway & Oxygen Therapy: Patient Spontanous Breathing  Post-op Assessment: Report given to RN and Post -op Vital signs reviewed and stable  Post vital signs: Reviewed and stable  Last Vitals:  Vitals Value Taken Time  BP 132/87 01/16/22 1141  Temp    Pulse 85 01/16/22 1142  Resp 23 01/16/22 1142  SpO2 94 % 01/16/22 1142  Vitals shown include unvalidated device data.  Last Pain:  Vitals:   01/16/22 0817  TempSrc:   PainSc: 0-No pain         Complications: No notable events documented.

## 2022-01-16 NOTE — Anesthesia Postprocedure Evaluation (Signed)
Anesthesia Post Note  Patient: [name]  Procedure(s) Performed: RIGHT ARM ARTERIOVENOUS (AV) FISTULA (Right: Arm Upper)     Patient location during evaluation: PACU Anesthesia Type: MAC and Regional Level of consciousness: awake and alert Pain management: pain level controlled Vital Signs Assessment: post-procedure vital signs reviewed and stable Respiratory status: spontaneous breathing, nonlabored ventilation and respiratory function stable Cardiovascular status: blood pressure returned to baseline and stable Postop Assessment: no apparent nausea or vomiting Anesthetic complications: no   No notable events documented.  Last Vitals:  Vitals:   01/16/22 1145 01/16/22 1215  BP: (!) 146/85 (!) 163/83  Pulse: 79 85  Resp: (!) 21 19  Temp:  36.4 C  SpO2: 93% 94%    Last Pain:  Vitals:   01/16/22 1215  TempSrc:   PainSc: 0-No pain                 Patrik Turnbaugh,W. EDMOND

## 2022-01-16 NOTE — Anesthesia Procedure Notes (Signed)
Anesthesia Regional Block: Supraclavicular block   Pre-Anesthetic Checklist: , timeout performed,  Correct Patient, Correct Site, Correct Laterality,  Correct Procedure, Correct Position, site marked,  Risks and benefits discussed,  Pre-op evaluation,  At surgeon's request and post-op pain management  Laterality: Right  Prep: Maximum Sterile Barrier Precautions used, chloraprep       Needles:  Injection technique: Single-shot  Needle Type: Echogenic Stimulator Needle     Needle Length: 9cm  Needle Gauge: 21     Additional Needles:   Procedures:,,,, ultrasound used (permanent image in chart),,    Narrative:  Start time: 01/16/2022 9:23 AM End time: 01/16/2022 9:33 AM Injection made incrementally with aspirations every 5 mL.  Performed by: Personally  Anesthesiologist: Roderic Palau, MD

## 2022-01-16 NOTE — Anesthesia Procedure Notes (Signed)
Procedure Name: General with mask airway Date/Time: 01/16/2022 10:30 AM  Performed by: Erick Colace, CRNAPre-anesthesia Checklist: Patient identified, Emergency Drugs available, Suction available and Patient being monitored Patient Re-evaluated:Patient Re-evaluated prior to induction Oxygen Delivery Method: Simple face mask Preoxygenation: Pre-oxygenation with 100% oxygen Induction Type: IV induction Dental Injury: Teeth and Oropharynx as per pre-operative assessment

## 2022-01-16 NOTE — Progress Notes (Signed)
Orthopedic Tech Progress Note Patient Details:  AZZURE GARABEDIAN 14-Jan-1946 [id number]  PACU RN called requesting an ARM SLING   Ortho Devices Type of Ortho Device: Arm sling Ortho Device/Splint Location: RUE Ortho Device/Splint Interventions: Ordered   Post Interventions Patient Tolerated: Well Instructions Provided: Care of device  Janit Pagan 01/16/2022, 12:23 PM

## 2022-01-16 NOTE — Op Note (Signed)
    NAME: KAYTEE TALIERCIO    MRN: [id number] DOB: [birth year]    DATE OF OPERATION: 01/16/2022  PREOP DIAGNOSIS:    End-stage renal disease  POSTOP DIAGNOSIS:    Same  PROCEDURE:    Right brachiocephalic AV fistula  SURGEON: Judeth Cornfield. Scot Dock, MD  ASSIST: Leontine Locket, PA  ANESTHESIA: Axillary block  EBL: Minimal  INDICATIONS:    ASTARIA NANEZ is a 76 y.o. female who has a functioning catheter and presents for new access.  FINDINGS:   4 mm upper arm cephalic vein.  3 and half millimeter brachial artery.  TECHNIQUE:   The patient was taken to the operating room.  I looked at the cephalic vein myself with the SonoSite and I felt she was a good candidate for a brachiocephalic fistula in the upper arm.  The right arm was prepped and draped in the usual sterile fashion.  A transverse incision was made just above the antecubital level.  Here the cephalic vein was dissected free.  A large lateral branch was divided between 2-0 silk ties.  The vein was ligated distally and irrigated up with heparinized saline.  It was a good size vein about 4 mm.  The brachial artery was dissected free beneath the fascia.  The patient was heparinized.  The brachial artery was clamped proximally and distally and a longitudinal arteriotomy was made.  The vein was cut to the appropriate length spatulated and sewn into side to the artery using continuous 6-0 Prolene suture.  At the completion there was an excellent thrill in the fistula.  There was a palpable radial pulse.  Heparin was partially reversed with protamine.  The wound was closed with a deep layer of 3-0 Vicryl and the skin closed with 4-0 Monocryl.  Dermabond was applied.  The patient tolerated the procedure well was transferred to recovery room in stable condition.  All needle and sponge counts were correct.  Given the complexity of the case,  the assistant was necessary in order to expedient the procedure and safely perform the technical  aspects of the operation.  The assistant provided traction and countertraction to assist with exposure of the artery and vein.  They also assisted with suture ligation of multiple venous branches.  They played a critical role in the anastomosis. These skills, especially following the Prolene suture for the anastomosis, could not have been adequately performed by a scrub tech assistant.    Deitra Mayo, MD, FACS [facility]  DATE OF DICTATION:   01/16/2022

## 2022-01-16 NOTE — Interval H&P Note (Signed)
History and Physical Interval Note:  01/16/2022 9:53 AM  [name]  has presented today for surgery, with the diagnosis of ESRD.  The various methods of treatment have been discussed with the patient and family. After consideration of risks, benefits and other options for treatment, the patient has consented to  Procedure(s): RIGHT ARM ARTERIOVENOUS (AV) FISTULA VERSUS ARTERIOVENOUS GRAFT CREATION (Right) as a surgical intervention.  The patient's history has been reviewed, patient examined, no change in status, stable for surgery.  I have reviewed the patient's chart and labs.  Questions were answered to the patient's satisfaction.     Deitra Mayo

## 2022-01-16 NOTE — Anesthesia Preprocedure Evaluation (Signed)
Anesthesia Evaluation  Patient identified by MRN, date of birth, ID band Patient awake    Reviewed: Allergy & Precautions, H&P , NPO status , Patient's Chart, lab work & pertinent test results  Airway Mallampati: III  TM Distance: >3 FB Neck ROM: Full    Dental no notable dental hx. (+) Edentulous Upper, Edentulous Lower, Dental Advisory Given   Pulmonary asthma , Current SmokerPatient did not abstain from smoking.,    Pulmonary exam normal breath sounds clear to auscultation       Cardiovascular hypertension,  Rhythm:Regular Rate:Normal     Neuro/Psych  Headaches, Anxiety Depression    GI/Hepatic Neg liver ROS, GERD  Medicated,  Endo/Other  diabetes  Renal/GU ESRF and DialysisRenal diseasenegative Renal ROS  negative genitourinary   Musculoskeletal  (+) Arthritis , Osteoarthritis,    Abdominal   Peds  Hematology  (+) Blood dyscrasia, anemia ,   Anesthesia Other Findings   Reproductive/Obstetrics negative OB ROS                             Anesthesia Physical Anesthesia Plan  ASA: 3  Anesthesia Plan: MAC and Regional   Post-op Pain Management: Tylenol PO (pre-op)*   Induction: Intravenous  PONV Risk Score and Plan: 2 and Propofol infusion and Ondansetron  Airway Management Planned: Natural Airway and Simple Face Mask  Additional Equipment:   Intra-op Plan:   Post-operative Plan:   Informed Consent: I have reviewed the patients History and Physical, chart, labs and discussed the procedure including the risks, benefits and alternatives for the proposed anesthesia with the patient or authorized representative who has indicated his/her understanding and acceptance.     Dental advisory given  Plan Discussed with: CRNA  Anesthesia Plan Comments:         Anesthesia Quick Evaluation

## 2022-01-17 ENCOUNTER — Encounter (HOSPITAL_COMMUNITY): Payer: Self-pay | Admitting: Vascular Surgery

## 2022-01-19 ENCOUNTER — Other Ambulatory Visit: Payer: Self-pay | Admitting: Nurse Practitioner

## 2022-01-19 DIAGNOSIS — Z8673 Personal history of transient ischemic attack (TIA), and cerebral infarction without residual deficits: Secondary | ICD-10-CM

## 2022-01-19 DIAGNOSIS — I639 Cerebral infarction, unspecified: Secondary | ICD-10-CM

## 2022-02-09 ENCOUNTER — Other Ambulatory Visit: Payer: Self-pay | Admitting: *Deleted

## 2022-02-09 DIAGNOSIS — N186 End stage renal disease: Secondary | ICD-10-CM

## 2022-02-09 NOTE — Telephone Encounter (Signed)
Patient answered and stated that she wants the medication Discarded, and does not want to come to office to pick up.   Will call the pharmacy to get direction on how they want it to be shipped back or discarded.

## 2022-02-13 NOTE — Telephone Encounter (Signed)
[place] [phone] and spoke with Gwyndolyn Saxon with the Resolution Team and he stated that once the medication was shipped it could not be returned. Stated that it will not affect the patient because she had NO copay.   Stated to Discard the medication.   Faythe Dingwall requested that we also email the [facility]ician [facility] where we order Prolia and get their opinion on the process. [place] emailed, awaiting response.

## 2022-03-02 ENCOUNTER — Ambulatory Visit (INDEPENDENT_AMBULATORY_CARE_PROVIDER_SITE_OTHER): Payer: 59 | Admitting: Physician Assistant

## 2022-03-02 ENCOUNTER — Ambulatory Visit (HOSPITAL_COMMUNITY)
Admission: RE | Admit: 2022-03-02 | Discharge: 2022-03-02 | Disposition: A | Discharge status: Home / Self Care | Payer: 59 | Source: Ambulatory Visit | Attending: Vascular Surgery | Admitting: Vascular Surgery

## 2022-03-02 ENCOUNTER — Other Ambulatory Visit: Payer: Self-pay

## 2022-03-02 VITALS — BP 179/94 | HR 85 | Temp 97.7°F | Resp 20 | Ht 65.0 in | Wt 164.8 lb

## 2022-03-02 DIAGNOSIS — Z992 Dependence on renal dialysis: Secondary | ICD-10-CM | POA: Diagnosis present

## 2022-03-02 DIAGNOSIS — N186 End stage renal disease: Secondary | ICD-10-CM | POA: Insufficient documentation

## 2022-03-02 NOTE — Progress Notes (Signed)
POST OPERATIVE OFFICE NOTE    CC:  F/u for surgery  HPI:  [name] is a 76 y.o. female who is s/p creation of right brachiocephalic AV fistula on 2/70/6237 by Dr. Scot Dock.  This was done as the patient's first permanent access.  Pt returns today for follow up.  Pt states everything has gone well since surgery.  She thinks that her incision has healed well.  She denies any pain in her right hand, hand coldness, or difficulty with grip strength.  She currently dialyzes via right IJ TDC on T,Th,Sat. Allergies  Allergen Reactions   Lisinopril Other (See Comments)    Abnormal Kidney Function    Pioglitazone Other (See Comments)    REACTION: Desquamation of skin of the palm   Morphine And Related Nausea And Vomiting   Sulfonamide Derivatives Rash    Current Outpatient Medications  Medication Sig Dispense Refill   acetaminophen (TYLENOL) 500 MG tablet Take 1 tablet (500 mg total) by mouth every 6 (six) hours as needed. (Patient taking differently: Take 1,000 mg by mouth every 6 (six) hours as needed for moderate pain.) 30 tablet 0   aspirin 81 MG chewable tablet Chew 1 tablet (81 mg total) by mouth daily. 30 tablet 0   B Complex-C-Zn-Folic Acid (DIALYVITE 628 WITH ZINC) 0.8 MG TABS Take 1 tablet by mouth Every Tuesday,Thursday,and Saturday with dialysis.     calcium acetate (PHOSLO) 667 MG capsule Take 2,001 mg by mouth 3 (three) times daily with meals.     Cinacalcet HCl (SENSIPAR PO) Take 1 capsule by mouth Every Tuesday,Thursday,and Saturday with dialysis.     clopidogrel (PLAVIX) 75 MG tablet TAKE 1 TABLET BY MOUTH EVERY DAY 90 tablet 1   diphenhydrAMINE (BENADRYL) 25 MG tablet Take 25 mg by mouth every 6 (six) hours as needed for allergies.     ezetimibe (ZETIA) 10 MG tablet TAKE 1 TABLET BY MOUTH EVERY DAY 90 tablet 3   latanoprost (XALATAN) 0.005 % ophthalmic solution Place 1 drop into both eyes at bedtime.     Methoxy PEG-Epoetin Beta (MIRCERA IJ) Inject 75 mcg as directed  every 14 (fourteen) days.     oxyCODONE-acetaminophen (PERCOCET) 5-325 MG tablet Take 1 tablet by mouth every 6 (six) hours as needed for severe pain. 8 tablet 0   pantoprazole (PROTONIX) 40 MG tablet TAKE 1 TABLET BY MOUTH EVERY DAY 90 tablet 3   PARoxetine (PAXIL) 40 MG tablet TAKE 1 TABLET BY MOUTH EVERY DAY 90 tablet 1   pravastatin (PRAVACHOL) 40 MG tablet TAKE 1 TABLET BY MOUTH EVERYDAY AT BEDTIME 90 tablet 3   sevelamer carbonate (RENVELA) 800 MG tablet Take 2,400 mg by mouth See admin instructions. Take 2400 mg with each meal and each snack     traZODone (DESYREL) 100 MG tablet TAKE 1 TABLET BY MOUTH EVERYDAY AT BEDTIME 90 tablet 1   triamcinolone cream (KENALOG) 0.1 % APPLY 1 APPLICATION TOPICALLY 3 (THREE) TIMES DAILY AS NEEDED (RASH). 60 g 1   No current facility-administered medications for this visit.     ROS:  See HPI  Physical Exam:  Incision: Right upper extremity incision well-healed Extremities: Right brachiocephalic fistula with palpable thrill Neuro: Intact  Dialysis duplex study (03/02/2022):  OUTFLOW VEIN PSV (cm/s) Diameter (cm) Depth (cm) Describe Prox UA      167                    0.47  0.75 Mid UA         82                     0.60               0.31 Dist UA        197                    0.65               0.82 AC Fossa     623                   0.35               0.38    Assessment/Plan:  This is a 76 y.o. female who is s/p: creation of right brachiocephalic fistula on 1/69/6789 by Dr. Scot Dock   -Right brachiocephalic fistula with palpable thrill -Dialysis duplex study today demonstrates fistula with decent size in the mid and distal upper arm, however an inadequate size at 0.35cm in the [facility] fossa. The fistula is also deeper than 68m in the distal upper arm. At this time, the fistula would be difficult to access due to its immaturity -I have told the patient to exercise her R arm with a squeeze ball for the next 4 weeks and follow up with a  repeat duplex study -[facility]   MVicente Serene PA-C [facility] [phone]  Clinic MD:  DScot Dock

## 2022-03-08 ENCOUNTER — Other Ambulatory Visit: Payer: Self-pay | Admitting: Family

## 2022-03-08 DIAGNOSIS — I639 Cerebral infarction, unspecified: Secondary | ICD-10-CM

## 2022-03-08 NOTE — Telephone Encounter (Signed)
Pharmacy requested refills.  Pended Rx's and sent to [facility] for approval due to [place].

## 2022-03-23 ENCOUNTER — Encounter: Payer: 59 | Admitting: Nurse Practitioner

## 2022-04-05 ENCOUNTER — Ambulatory Visit (INDEPENDENT_AMBULATORY_CARE_PROVIDER_SITE_OTHER): Payer: 59 | Admitting: Physician Assistant

## 2022-04-05 ENCOUNTER — Ambulatory Visit (HOSPITAL_COMMUNITY)
Admission: RE | Admit: 2022-04-05 | Discharge: 2022-04-05 | Disposition: A | Discharge status: Home / Self Care | Payer: 59 | Source: Ambulatory Visit | Attending: Vascular Surgery | Admitting: Vascular Surgery

## 2022-04-05 VITALS — BP 190/103 | HR 85 | Temp 97.6°F | Ht 65.0 in | Wt 166.0 lb

## 2022-04-05 DIAGNOSIS — Z992 Dependence on renal dialysis: Secondary | ICD-10-CM | POA: Insufficient documentation

## 2022-04-05 DIAGNOSIS — N186 End stage renal disease: Secondary | ICD-10-CM

## 2022-04-05 NOTE — Progress Notes (Signed)
    Postoperative Access Visit   History of Present Illness   [name] is a 76 y.o. year old female who presents for postoperative follow-up for: right brachiocephalic arteriovenous fistula (Date: 01/16/22).  The patient's wounds are healed.  The patient denies steal symptoms.  The patient is able to complete their activities of daily living.  She is dialyzing via [facility] on a Tuesday Thursday Saturday schedule at the [facility] kidney center location.  Physical Examination   Vitals:   04/05/22 0917  BP: (!) 190/103  Pulse: 85  Temp: 97.6 F (36.4 C)  TempSrc: Temporal  SpO2: 100%  Weight: 166 lb (75.3 kg)  Height: '5\' 5"'$  (1.651 m)   Body mass index is 27.62 kg/m.  right arm Incision is healed, palpable radial pulse, hand grip is 5/5, sensation in digits is intact, palpable thrill, bruit can be auscultated     Medical Decision Making   [name] is a 76 y.o. year old female who presents s/p right brachiocephalic arteriovenous fistula  Patent BC AVF without signs or symptoms of steal syndrome The patient's access will be ready for use 04/11/22.  Fistula duplex shows that the depth of the fistula in the upper arm is less than 6 mm.  On physical exam, the fistula depth seems borderline.  We will attempt cannulation of the fistula next week however if this proves to be difficult she may require superficialization surgery. The patient's tunneled dialysis catheter can be removed when Nephrology is comfortable with the performance of the fistula The patient may follow up on a prn basis   Dagoberto Ligas PA-C [facility] [place] Office: [phone]  Clinic MD: Donzetta Matters

## 2022-04-12 ENCOUNTER — Other Ambulatory Visit: Payer: Self-pay | Admitting: Nurse Practitioner

## 2022-04-12 DIAGNOSIS — G4700 Insomnia, unspecified: Secondary | ICD-10-CM

## 2022-05-10 ENCOUNTER — Other Ambulatory Visit: Payer: Self-pay | Admitting: Gastroenterology

## 2022-05-22 ENCOUNTER — Other Ambulatory Visit: Payer: Self-pay | Admitting: Nurse Practitioner

## 2022-05-24 LAB — COMPREHENSIVE METABOLIC PANEL
Albumin: 4.1 (ref 3.5–5.0)
Calcium: 9.5 (ref 8.7–10.7)

## 2022-05-24 LAB — CBC AND DIFFERENTIAL: Hemoglobin: 10.6 — AB (ref 12.0–16.0)

## 2022-05-24 LAB — BASIC METABOLIC PANEL: Potassium: 4.3 mEq/L (ref 3.5–5.1)

## 2022-06-12 ENCOUNTER — Ambulatory Visit (INDEPENDENT_AMBULATORY_CARE_PROVIDER_SITE_OTHER): Payer: 59 | Admitting: Nurse Practitioner

## 2022-06-12 ENCOUNTER — Encounter: Payer: Self-pay | Admitting: Nurse Practitioner

## 2022-06-12 VITALS — BP 138/84 | HR 86 | Temp 97.9°F | Ht 65.0 in | Wt 175.0 lb

## 2022-06-12 DIAGNOSIS — E785 Hyperlipidemia, unspecified: Secondary | ICD-10-CM

## 2022-06-12 DIAGNOSIS — R079 Chest pain, unspecified: Secondary | ICD-10-CM

## 2022-06-12 DIAGNOSIS — N186 End stage renal disease: Secondary | ICD-10-CM

## 2022-06-12 DIAGNOSIS — E1142 Type 2 diabetes mellitus with diabetic polyneuropathy: Secondary | ICD-10-CM

## 2022-06-12 DIAGNOSIS — F17200 Nicotine dependence, unspecified, uncomplicated: Secondary | ICD-10-CM

## 2022-06-12 DIAGNOSIS — R42 Dizziness and giddiness: Secondary | ICD-10-CM

## 2022-06-12 DIAGNOSIS — Z992 Dependence on renal dialysis: Secondary | ICD-10-CM

## 2022-06-12 DIAGNOSIS — K219 Gastro-esophageal reflux disease without esophagitis: Secondary | ICD-10-CM

## 2022-06-12 DIAGNOSIS — I10 Essential (primary) hypertension: Secondary | ICD-10-CM

## 2022-06-12 DIAGNOSIS — R2689 Other abnormalities of gait and mobility: Secondary | ICD-10-CM

## 2022-06-12 DIAGNOSIS — R296 Repeated falls: Secondary | ICD-10-CM

## 2022-06-12 DIAGNOSIS — F39 Unspecified mood [affective] disorder: Secondary | ICD-10-CM

## 2022-06-12 NOTE — Patient Instructions (Signed)
To stop benadryl and start zyrtec 10 mg daily

## 2022-06-12 NOTE — Progress Notes (Unsigned)
Careteam: Patient Care Team: [name], NP as PCP - General (Nurse Practitioner) [name], MD as Consulting Physician (Ophthalmology) [name], MD as Consulting Physician (Nephrology) [name], NP as Nurse Practitioner (Nurse Practitioner) Center, [place]:  [place] Directive information    Allergies  Allergen Reactions   Lisinopril Other (See Comments)    Abnormal Kidney Function    Pioglitazone Other (See Comments)    REACTION: Desquamation of skin of the palm   Morphine And Related Nausea And Vomiting   Sulfonamide Derivatives Rash    Chief Complaint  Patient presents with   Medical Management of Chronic Issues    Patient presents today for 6 month follow-up   Quality Metric Gaps    Eye exam (06/18/22), AWV, lung cancer screening, COVID#5     HPI: Patient is a 77 y.o. female here for routine follow-up.  Pt is a poor historian.  She fell a couple of weeks ago from feeling dizzy after dialysis while she was going back into her house. She has had multiple falls. She cannot recall how many times she has fallen. It is somtimes after dialysis and she feels dizzy. Her brother is with her but it usually occurs when she is going in the house and he isn't right there with her. She does not eat on dialysis days. She reports she thinks her BP are in the 200s when she arrives for dialysis but can't recall what they are when she leaves. Sometimes she falls on days she does not have dialysis.   She has had some chest pain intermittently, sometimes on the left and sometimes on the right. Occurs when she is active. Sharp when it initially starts. Stops when she rests. Denies nausea, shortness of breath, radiating pain, numbness, tingling, swelling in extremities, palpitations or headaches.  Denies constipation or diarrhea, blood in urine or blood in stool.  She has been taking benadryl almost every morning  for allergies.  Review of Systems:  Review of Systems  Constitutional:  Negative for chills, fever, malaise/fatigue and weight loss.  HENT:  Negative for congestion and sore throat.   Eyes:  Negative for blurred vision.  Respiratory:  Negative for cough, shortness of breath and wheezing.   Cardiovascular:  Positive for chest pain. Negative for palpitations and leg swelling.  Gastrointestinal:  Negative for abdominal pain, blood in stool, constipation, diarrhea, heartburn, nausea and vomiting.  Genitourinary:  Negative for dysuria, frequency, hematuria and urgency.  Musculoskeletal:  Positive for falls. Negative for joint pain.  Skin:  Negative for rash.  Neurological:  Negative for dizziness, tingling and headaches.  Endo/Heme/Allergies:  Negative for polydipsia.  Psychiatric/Behavioral:  Negative for depression. The patient is not nervous/anxious.     Past Medical History:  Diagnosis Date   Anemia    Anxiety    Arthritis    lower back and knees    Asthma    childhood   Barrett's esophagus    Chronic kidney disease    Coagulation defect (HCC)    Depression    Diabetes mellitus    Dysphagia    Eczema    End stage renal disease (HCC)    Fluid overload, unspecified    History of herpes zoster 02/2010   Recovered fully after period of acute herpetic neuralgia tx w/ gabapentin.    Hx MRSA infection 2009   Hypercalcemia    Hyperlipidemia    Hypertension    Moderate protein-calorie  malnutrition ([place])    Neuromuscular disorder ([place])    chronic pain   Personal history of colonic polyps 10/17/2010   hyperplastic   Past Surgical History:  Procedure Laterality Date   ABDOMINAL HYSTERECTOMY     unclear when   AV FISTULA PLACEMENT Left 02/13/2017   Procedure: ARTERIOVENOUS (AV) GORE-TEX STRETCH GRAFT INSERTION INTO LEFT ARM;  Surgeon: Elam Dutch, MD;  Location: [facility];  Service: Vascular;  Laterality: Left;   AV FISTULA PLACEMENT Right 01/16/2022   Procedure: RIGHT ARM  ARTERIOVENOUS (AV) FISTULA;  Surgeon: Angelia Mould, MD;  Location: [facility];  Service: Vascular;  Laterality: Right;   CATARACT EXTRACTION Bilateral    COLONOSCOPY     IR FLUORO GUIDE CV LINE RIGHT  01/09/2022   IR US GUIDE VASC ACCESS RIGHT  01/09/2022   OTHER SURGICAL HISTORY     Catherer placed by [facility], right upper chest area.   thigh surg     left side due to MRSA   UPPER GASTROINTESTINAL ENDOSCOPY     Social History:   reports that she has been smoking cigarettes. She has a 20.00 pack-year smoking history. She has never been exposed to tobacco smoke. She has never used smokeless tobacco. She reports current alcohol use of about 2.0 standard drinks of alcohol per week. She reports that she does not use drugs.  Family History  Problem Relation Age of Onset   Hypertension Father    Kidney disease Father    Diabetes Brother    Colon cancer Neg Hx    CAD Neg Hx    Rectal cancer Neg Hx    Stomach cancer Neg Hx     Medications: Patient's Medications  New Prescriptions   No medications on file  Previous Medications   ACETAMINOPHEN (TYLENOL) 500 MG TABLET    Take 1 tablet (500 mg total) by mouth every 6 (six) hours as needed.   ASPIRIN 81 MG CHEWABLE TABLET    Chew 1 tablet (81 mg total) by mouth daily.   B COMPLEX-C-ZN-FOLIC ACID (DIALYVITE Q000111Q WITH ZINC) 0.8 MG TABS    Take 1 tablet by mouth Every Tuesday,Thursday,and Saturday with dialysis.   CALCIUM ACETATE (PHOSLO) 667 MG CAPSULE    Take 2,001 mg by mouth 3 (three) times daily with meals.   CINACALCET HCL (SENSIPAR PO)    Take 1 capsule by mouth Every Tuesday,Thursday,and Saturday with dialysis.   CLOPIDOGREL (PLAVIX) 75 MG TABLET    TAKE 1 TABLET BY MOUTH EVERY DAY   DIPHENHYDRAMINE (BENADRYL) 25 MG TABLET    Take 25 mg by mouth every 6 (six) hours as needed for allergies.   EZETIMIBE (ZETIA) 10 MG TABLET    TAKE 1 TABLET BY MOUTH EVERY DAY   LATANOPROST (XALATAN) 0.005 % OPHTHALMIC SOLUTION    Place 1 drop into  both eyes at bedtime.   METHOXY PEG-EPOETIN BETA (MIRCERA IJ)    Inject 75 mcg as directed every 14 (fourteen) days.   PANTOPRAZOLE (PROTONIX) 40 MG TABLET    TAKE 1 TABLET BY MOUTH EVERY DAY   PAROXETINE (PAXIL) 40 MG TABLET    TAKE 1 TABLET BY MOUTH EVERY DAY   PRAVASTATIN (PRAVACHOL) 40 MG TABLET    TAKE 1 TABLET BY MOUTH EVERYDAY AT BEDTIME   SEVELAMER CARBONATE (RENVELA) 800 MG TABLET    Take 2,400 mg by mouth See admin instructions. Take 2400 mg with each meal and each snack   TRAZODONE (DESYREL) 100 MG TABLET  TAKE 1 TABLET BY MOUTH EVERYDAY AT BEDTIME   TRIAMCINOLONE CREAM (KENALOG) 0.1 %    APPLY 1 APPLICATION TOPICALLY 3 (THREE) TIMES DAILY AS NEEDED (RASH).  Modified Medications   No medications on file  Discontinued Medications   OXYCODONE-ACETAMINOPHEN (PERCOCET) 5-325 MG TABLET    Take 1 tablet by mouth every 6 (six) hours as needed for severe pain.    Physical Exam:  Vitals:   06/12/22 0857  BP: 138/84  Pulse: 86  Temp: 97.9 F (36.6 C)  SpO2: 97%  Weight: 175 lb (79.4 kg)  Height: 5' 5"$  (1.651 m)   Body mass index is 29.12 kg/m. Wt Readings from Last 3 Encounters:  06/12/22 175 lb (79.4 kg)  04/05/22 166 lb (75.3 kg)  03/02/22 164 lb 12.8 oz (74.8 kg)    Physical Exam Vitals reviewed.  Constitutional:      General: She is not in acute distress.    Appearance: Normal appearance.  Cardiovascular:     Rate and Rhythm: Normal rate and regular rhythm.     Comments: AV fistula present on RUE +thrill +bruit Hemodialysis catheter present on upper R chest, dsg CDI Pulmonary:     Effort: No respiratory distress.     Breath sounds: Normal breath sounds.  Abdominal:     General: Bowel sounds are normal. There is no distension.     Palpations: Abdomen is soft. There is no mass.     Tenderness: There is no abdominal tenderness. There is no guarding.  Musculoskeletal:     Cervical back: Neck supple.  Lymphadenopathy:     Cervical: No cervical adenopathy.   Skin:    General: Skin is warm and dry.  Neurological:     Mental Status: She is alert and oriented to person, place, and time.  Psychiatric:        Mood and Affect: Mood normal.     Labs reviewed: Basic Metabolic Panel: Recent Labs    01/08/22 1821 01/16/22 0836 05/24/22 0000  NA 140 139  --   K 4.2 4.8 4.3  CL 98 101  --   CO2 28  --   --   GLUCOSE 109* 85  --   BUN 41* 39*  --   CREATININE 12.94* 10.80*  --   CALCIUM 9.2  --  9.5  MG 2.6*  --   --   PHOS 5.4*  --   --    Liver Function Tests: Recent Labs    01/08/22 1821 05/24/22 0000  AST 32  --   ALT 21  --   ALKPHOS 72  --   BILITOT 0.6  --   PROT 5.5*  --   ALBUMIN 3.3* 4.1   No results for input(s): "LIPASE", "AMYLASE" in the last 8760 hours. No results for input(s): "AMMONIA" in the last 8760 hours. CBC: Recent Labs    01/08/22 1821 01/16/22 0836 05/24/22 0000  WBC 8.1  --   --   NEUTROABS 4.7  --   --   HGB 9.4* 10.2* 10.6*  HCT 28.4* 30.0*  --   MCV 101.1*  --   --   PLT 154  --   --    Lipid Panel: No results for input(s): "CHOL", "HDL", "LDLCALC", "TRIG", "CHOLHDL", "LDLDIRECT" in the last 8760 hours. TSH: No results for input(s): "TSH" in the last 8760 hours. A1C: Lab Results  Component Value Date   HGBA1C 5.3 11/10/2021     Assessment/Plan 1. Controlled type 2 diabetes mellitus  with diabetic polyneuropathy, without long-term current use of insulin (HCC) Pt currently not on any medication. Stable from dialysis labs.   2. Hyperlipidemia with target LDL less than 70 Continue pravastatin and zetia in addition to lifestyle and diet modifications. - Lipid Panel today  3. Chest pain, unspecified type EKG looks similar to last EKG. Patient has numerous cardiac risk factors. She is taking plavix and aspirin.  - EKG 12-Lead - Ambulatory referral to Cardiology due to chest pains.   4. Multiple falls Stop diphenhydramine. Eat breakfast prior to dialysis, ?hypotension after dialysis.   Fall precautions. Discussed with patient about being on a blood thinner and risks of falling and bleeding. Ordered HH. Check orthostatic BP today. - Ambulatory referral to Home Health  5. Dizziness PT, check orthostatic BP today which were negative however ?hypotension after dialysis. Stop benadryl use. - Ambulatory referral to [place]  6. ESRD (end stage renal disease) on dialysis [facility]) Patient follows with nephrology with dialysis on Tuesday/Thursday/Saturday  7. Essential hypertension BP elevated today and appears elevated on review of other medical appts. Pt reports it is high when she arrives at dialysis. Concerned for possible hypotension post-dialysis. Monitor closely- she will be seeing cardiology.   8. Gastroesophageal reflux disease, unspecified whether esophagitis present Stable on protonix.  9. Mood disorder ([place]) Stable on paxil.  10. Smoker She can't remember when she quit smoking, but was a smoker for ' a long time'.  - Ambulatory Referral for Lung Cancer Scre  11. Imbalance -PT/OT consult.     Return in about 3 months (around 09/10/2022) for routine follow up .  Student- Archer Asa O'Berry ACPCNP-S  I personally was present during the history, physical exam and medical decision-making activities of this service and have verified that the service and findings are accurately documented in the student's note Moise Friday K. [place], [facility] [phone]

## 2022-06-13 LAB — LIPID PANEL
Cholesterol: 143 mg/dL (ref <200)
HDL: 50 mg/dL (ref >=50)
LDL Cholesterol (Calc): 70 mg/dL (calc)
Non-HDL Cholesterol (Calc): 93 mg/dL (calc) (ref <130)
Total CHOL/HDL Ratio: 2.9 (calc) (ref <5.0)
Triglycerides: 157 mg/dL — ABNORMAL HIGH (ref <150)

## 2022-06-28 NOTE — Addendum Note (Signed)
Addended by: [name] on: 06/28/2022 08:55 AM   Modules accepted: Orders

## 2022-07-04 ENCOUNTER — Encounter: Payer: 59 | Admitting: Nurse Practitioner

## 2022-07-10 ENCOUNTER — Other Ambulatory Visit: Payer: Self-pay | Admitting: *Deleted

## 2022-07-10 DIAGNOSIS — N186 End stage renal disease: Secondary | ICD-10-CM

## 2022-07-12 ENCOUNTER — Telehealth: Payer: Self-pay | Admitting: Vascular Surgery

## 2022-07-12 NOTE — Telephone Encounter (Signed)
Angelia Mould, MD  Garrel Ridgel Yes a vein map please Gerald Stabs       Previous Messages    ----- Message ----- From: Garrel Ridgel Sent: 06/30/2022   2:00 PM EST To: Angelia Mould, MD Subject: RE: new access                                Does this patient require U/S?  Thank you Verdis Frederickson. ----- Message ----- From: Angelia Mould, MD Sent: 06/29/2022   1:33 PM EST To: Dwana Melena, MD; [facility] Admin Pool Subject: RE: new access                                Thanks [employer]ra [employer] keep an eye out for her. Take care, Gerald Stabs  [facility]: Can we please make sure that she is on my schedule since I have this history.  Thank you. CD

## 2022-07-14 ENCOUNTER — Encounter: Payer: Self-pay | Admitting: Cardiovascular Disease

## 2022-07-14 ENCOUNTER — Ambulatory Visit: Payer: 59 | Attending: Cardiovascular Disease | Admitting: Cardiovascular Disease

## 2022-07-14 ENCOUNTER — Encounter: Payer: Self-pay | Admitting: *Deleted

## 2022-07-14 VITALS — BP 130/70 | HR 85 | Ht 65.0 in | Wt 173.4 lb

## 2022-07-14 DIAGNOSIS — R079 Chest pain, unspecified: Secondary | ICD-10-CM | POA: Diagnosis not present

## 2022-07-14 NOTE — Patient Instructions (Signed)
Medication Instructions:  Your physician recommends that you continue on your current medications as directed. Please refer to the Current Medication list given to you today.  *If you need a refill on your cardiac medications before your next appointment, please call your pharmacy*   Lab Work: none If you have labs (blood work) drawn today and your tests are completely normal, you will receive your results only by: [place] (if you have MyChart) OR A paper copy in the mail If you have any lab test that is abnormal or we need to change your treatment, we will call you to review the results.   Testing/Procedures: Your physician has requested that you have an echocardiogram. Echocardiography is a painless test that uses sound waves to create images of your heart. It provides your doctor with information about the size and shape of your heart and how well your heart's chambers and valves are working. This procedure takes approximately one hour. There are no restrictions for this procedure. Please do NOT wear cologne, perfume, aftershave, or lotions (deodorant is allowed). Please arrive 15 minutes prior to your appointment time.  Your physician has requested that you have en exercise stress myoview. For further information please visit [email or web address]. Please follow instruction sheet, as given.   Follow-Up: At [facility], you and your health needs are our priority.  As part of our continuing mission to provide you with exceptional heart care, we have created designated Provider Care Teams.  These Care Teams include your primary Cardiologist (physician) and Advanced Practice Providers (APPs -  Physician Assistants and Nurse Practitioners) who all work together to provide you with the care you need, when you need it.  We recommend signing up for the patient portal called "MyChart".  Sign up information is provided on this After Visit Summary.  MyChart is used to connect with  patients for Virtual Visits (Telemedicine).  Patients are able to view lab/test results, encounter notes, upcoming appointments, etc.  Non-urgent messages can be sent to your provider as well.   To learn more about what you can do with MyChart, go to [email or web address].    Your next appointment:   8 week(s)  Provider:   [name], MD

## 2022-07-14 NOTE — Progress Notes (Signed)
Chief Complaint  Patient presents with   New Patient (Initial Visit)    Chest pain   History of Present Illness: 77 yo female with history of ongoing tobacco abuse, anemia, anxiety, depression, Barrett's esophagus, diabetes, ESRD on HD, HTN and hyperlipidemia who is here today as a new consult, referred by [name], NP, for the evaluation of chest pain. Echo in 2018 with normal LV function, severe LVH. Mild mitral regurgitation. No known CAD. She tells me today that she has sharp chest pains that last for a few seconds, occur once a month and can be anywhere in her chest, on the right or left side. She is on ASA and Plavix but she is not sure why she is on Plavix. She had a non cardiac chest CT in 2021 that showed 3 vessel CAD. She feels well overall. She continues to smoke 1/2 ppd. ESR on HD on Tues,Thurs,Sat.    Primary Care Provider: [name], NP   Past Medical History:  Diagnosis Date   Anemia    Anxiety    Arthritis    lower back and knees    Asthma    childhood   Barrett's esophagus    CAD (coronary artery disease)    Chronic kidney disease    Coagulation defect (HCC)    Depression    Diabetes mellitus    Dysphagia    Eczema    End stage renal disease (HCC)    Fluid overload, unspecified    History of herpes zoster 02/2010   Recovered fully after period of acute herpetic neuralgia tx w/ gabapentin.    Hx MRSA infection 2009   Hypercalcemia    Hyperlipidemia    Hypertension    Moderate protein-calorie malnutrition ([place])    Neuromuscular disorder ([place])    chronic pain   Personal history of colonic polyps 10/17/2010   hyperplastic    Past Surgical History:  Procedure Laterality Date   ABDOMINAL HYSTERECTOMY     unclear when   AV FISTULA PLACEMENT Left 02/13/2017   Procedure: ARTERIOVENOUS (AV) GORE-TEX STRETCH GRAFT INSERTION INTO LEFT ARM;  Surgeon: Elam Dutch, MD;  [facility];  Service: Vascular;  Laterality: Left;   AV FISTULA  PLACEMENT Right 01/16/2022   Procedure: RIGHT ARM ARTERIOVENOUS (AV) FISTULA;  Surgeon: Angelia Mould, MD;  Location: [facility];  Service: Vascular;  Laterality: Right;   CATARACT EXTRACTION Bilateral    COLONOSCOPY     IR FLUORO GUIDE CV LINE RIGHT  01/09/2022   IR US GUIDE VASC ACCESS RIGHT  01/09/2022   OTHER SURGICAL HISTORY     Catherer placed by [facility], right upper chest area.   thigh surg     left side due to MRSA   UPPER GASTROINTESTINAL ENDOSCOPY      Current Outpatient Medications  Medication Sig Dispense Refill   acetaminophen (TYLENOL) 500 MG tablet Take 1 tablet (500 mg total) by mouth every 6 (six) hours as needed. (Patient taking differently: Take 1,000 mg by mouth every 6 (six) hours as needed for moderate pain.) 30 tablet 0   aspirin 81 MG chewable tablet Chew 1 tablet (81 mg total) by mouth daily. 30 tablet 0   B Complex-C-Zn-Folic Acid (DIALYVITE Q000111Q WITH ZINC) 0.8 MG TABS Take 1 tablet by mouth Every Tuesday,Thursday,and Saturday with dialysis.     calcium acetate (PHOSLO) 667 MG capsule Take 2,001 mg by mouth 3 (three) times daily with meals.     Cinacalcet HCl (  SENSIPAR PO) Take 1 capsule by mouth Every Tuesday,Thursday,and Saturday with dialysis.     clopidogrel (PLAVIX) 75 MG tablet TAKE 1 TABLET BY MOUTH EVERY DAY 90 tablet 1   diphenhydrAMINE (BENADRYL) 25 MG tablet Take 25 mg by mouth every 6 (six) hours as needed for allergies.     ezetimibe (ZETIA) 10 MG tablet TAKE 1 TABLET BY MOUTH EVERY DAY 90 tablet 3   latanoprost (XALATAN) 0.005 % ophthalmic solution Place 1 drop into both eyes at bedtime.     pantoprazole (PROTONIX) 40 MG tablet TAKE 1 TABLET BY MOUTH EVERY DAY 90 tablet 3   PARoxetine (PAXIL) 40 MG tablet TAKE 1 TABLET BY MOUTH EVERY DAY 90 tablet 1   pravastatin (PRAVACHOL) 40 MG tablet TAKE 1 TABLET BY MOUTH EVERYDAY AT BEDTIME 90 tablet 3   sevelamer carbonate (RENVELA) 800 MG tablet Take 2,400 mg by mouth See admin instructions. Take 2400 mg  with each meal and each snack     traZODone (DESYREL) 100 MG tablet TAKE 1 TABLET BY MOUTH EVERYDAY AT BEDTIME 90 tablet 1   triamcinolone cream (KENALOG) 0.1 % APPLY 1 APPLICATION TOPICALLY 3 (THREE) TIMES DAILY AS NEEDED (RASH). 60 g 1   No current facility-administered medications for this visit.    Allergies  Allergen Reactions   Lisinopril Other (See Comments)    Abnormal Kidney Function    Pioglitazone Other (See Comments)    REACTION: Desquamation of skin of the palm   Morphine And Related Nausea And Vomiting   Sulfonamide Derivatives Rash    Social History   Socioeconomic History   Marital status: Divorced    Spouse name: Not on file   Number of children: 0   Years of education: Not on file   Highest education level: Not on file  Occupational History   Occupation: Retired    Fish farm manager: SELF-EMPLOYED  Tobacco Use   Smoking status: Every Day    Packs/day: 0.50    Years: 40.00    Additional pack years: 0.00    Total pack years: 20.00    Types: Cigarettes    Passive exposure: Never   Smokeless tobacco: Never   Tobacco comments:    smokes 1 pack of cigerettes a week- off and on  Vaping Use   Vaping Use: Never used  Substance and Sexual Activity   Alcohol use: Yes    Alcohol/week: 2.0 standard drinks of alcohol    Types: 2 Standard drinks or equivalent per week    Comment: wine and beer- occasionally    Drug use: No   Sexual activity: Never  Other Topics Concern   Not on file  Social History Narrative   Financial assistance approved for 100% discount at [facility] and has [facility] card per Bonna Gains   02/10/2010      3 caffeine drinks daily    Social Determinants of Health   Financial Resource Strain: Low Risk  (01/30/2018)   Overall Financial Resource Strain (CARDIA)    Difficulty of Paying Living Expenses: Not hard at all  Food Insecurity: No Food Insecurity (01/30/2018)   Hunger Vital Sign    Worried About Running Out of Food in the Last Year: Never true     Ran Out of Food in the Last Year: Never true  Transportation Needs: No Transportation Needs (01/30/2018)   PRAPARE - Hydrologist (Medical): No    Lack of Transportation (Non-Medical): No  Physical Activity: Sufficiently Active (01/30/2018)   Exercise  Vital Sign    Days of Exercise per Week: 7 days    Minutes of Exercise per Session: 30 min  Stress: Stress Concern Present (01/30/2018)   [place]    Feeling of Stress : To some extent  Social Connections: Moderately Isolated (01/30/2018)   Social Connection and Isolation Panel [NHANES]    Frequency of Communication with Friends and Family: More than three times a week    Frequency of Social Gatherings with Friends and Family: More than three times a week    Attends Religious Services: Never    Marine scientist or Organizations: No    Attends Archivist Meetings: Never    Marital Status: Divorced  Human resources officer Violence: Not At Risk (01/30/2018)   Humiliation, Afraid, Rape, and Kick questionnaire    Fear of Current or Ex-Partner: No    Emotionally Abused: No    Physically Abused: No    Sexually Abused: No    Family History  Problem Relation Age of Onset   Hypertension Father    Kidney disease Father    Diabetes Brother    Colon cancer Neg Hx    CAD Neg Hx    Rectal cancer Neg Hx    Stomach cancer Neg Hx     Review of Systems:  As stated in the HPI and otherwise negative.   BP 130/70   Pulse 85   Ht 5\' 5"  (1.651 m)   Wt 78.7 kg   SpO2 98%   BMI 28.86 kg/m   Physical Examination: General: Well developed, well nourished, NAD  HEENT: OP clear, mucus membranes moist  SKIN: warm, dry. No rashes. Neuro: No focal deficits  Musculoskeletal: Muscle strength 5/5 all ext  Psychiatric: Mood and affect normal  Neck: No JVD, no carotid bruits, no thyromegaly, no lymphadenopathy.  Lungs:Clear bilaterally, no wheezes,  rhonci, crackles Cardiovascular: Regular rate and rhythm. No murmurs, gallops or rubs. Abdomen:Soft. Bowel sounds present. Non-tender.  Extremities: No lower extremity edema. Pulses are 2 + in the bilateral DP/PT.  EKG:  EKG is ordered today. The ekg ordered today demonstrates NSR, rate 85 bpm. Inferior Q waves  Recent Labs: 01/08/2022: ALT 21; Magnesium 2.6; Platelets 154 01/16/2022: BUN 39; Creatinine, Ser 10.80; Sodium 139 05/24/2022: Hemoglobin 10.6; Potassium 4.3   Lipid Panel    Component Value Date/Time   CHOL 143 06/12/2022 1022   CHOL 270 (H) 08/27/2015 0937   TRIG 157 (H) 06/12/2022 1022   HDL 50 06/12/2022 1022   HDL 69 08/27/2015 0937   CHOLHDL 2.9 06/12/2022 1022   VLDL 56 (H) 12/03/2016 0625   LDLCALC 70 06/12/2022 1022   LDLDIRECT 127 (H) 07/17/2012 1415     Wt Readings from Last 3 Encounters:  07/14/22 78.7 kg  06/12/22 79.4 kg  04/05/22 75.3 kg    Assessment and Plan:   CAD with chest pain: Her chest pain is atypical for angina. The pains are sharp and only last a few seconds, occurring at rest. She has had no prior ischemic workup. She does have evidence of coronary artery calcification on her non cardiac chest CT in 2021. Her risk factors for CAD include age, ESRD, DM, HTN, HLD and tobacco abuse. I think an ischemic evaluation is indicated. A coronary CTA would not be appropriate given the likely high degree of vessel calcification. I will arrange an exercise nuclear stress test and an echo.   Labs/ tests ordered today include:  Orders Placed This Encounter  Procedures   Cardiac Stress Test: Informed Consent Details: Physician/Practitioner Attestation; Transcribe to consent form and obtain patient signature   MYOCARDIAL PERFUSION IMAGING   EKG 12-Lead   ECHOCARDIOGRAM COMPLETE   Disposition:   F/U with me in 2-3 months    Signed, [name], MD, [facility] 07/14/2022 10:14 AM    [address], [place],  [place]  [address] Phone: [phone]; Fax: [phone]

## 2022-07-14 NOTE — Addendum Note (Signed)
Addended by: Rodman Key on: 07/14/2022 10:22 AM   Modules accepted: Orders

## 2022-07-21 NOTE — Addendum Note (Signed)
Addended by: [name] D on: 07/21/2022 07:26 AM   Modules accepted: Orders

## 2022-07-24 ENCOUNTER — Ambulatory Visit (HOSPITAL_COMMUNITY)
Admission: RE | Admit: 2022-07-24 | Discharge: 2022-07-24 | Disposition: A | Discharge status: Home / Self Care | Payer: 59 | Source: Ambulatory Visit | Attending: Vascular Surgery | Admitting: Vascular Surgery

## 2022-07-24 ENCOUNTER — Ambulatory Visit (INDEPENDENT_AMBULATORY_CARE_PROVIDER_SITE_OTHER): Payer: 59 | Admitting: Physician Assistant

## 2022-07-24 ENCOUNTER — Encounter (HOSPITAL_COMMUNITY): Payer: Self-pay | Admitting: *Deleted

## 2022-07-24 VITALS — BP 183/98 | HR 84 | Temp 98.2°F | Wt 172.0 lb

## 2022-07-24 DIAGNOSIS — N186 End stage renal disease: Secondary | ICD-10-CM | POA: Insufficient documentation

## 2022-07-24 DIAGNOSIS — Z992 Dependence on renal dialysis: Secondary | ICD-10-CM | POA: Diagnosis not present

## 2022-07-24 NOTE — Progress Notes (Signed)
Established Dialysis Access   History of Present Illness   [name] is a 77 y.o. (1945/11/06) female who presents for evaluation of her right AV fistula at the request of Dr. Augustin Coupe. Her right brachiocephalic AV fistula was created on 01/16/22 by Dr. Scot Dock. Per note from Dr. Augustin Coupe she was recently sent to him due to malfunctioning of her fistula specifically difficulty with the venous access. Dr. Augustin Coupe has intervened on 2 separate occasions now with Angioplasty of inflow lesions, however he felt that despite these the fistula is still not working with residual stenosis. He has asked that we evaluate her for possible new access.  The patient is right hand dominant.  Previous access procedures have been completed in the left arm with Left upper arm AV graft by Dr. Oneida Alar in 2018. She currently has a functioning right IJ TDC. She dialyzes on TTS at the [facility].   Current Outpatient Medications  Medication Sig Dispense Refill   acetaminophen (TYLENOL) 500 MG tablet Take 1 tablet (500 mg total) by mouth every 6 (six) hours as needed. (Patient taking differently: Take 1,000 mg by mouth every 6 (six) hours as needed for moderate pain.) 30 tablet 0   aspirin 81 MG chewable tablet Chew 1 tablet (81 mg total) by mouth daily. 30 tablet 0   B Complex-C-Zn-Folic Acid (DIALYVITE Q000111Q WITH ZINC) 0.8 MG TABS Take 1 tablet by mouth Every Tuesday,Thursday,and Saturday with dialysis.     calcium acetate (PHOSLO) 667 MG capsule Take 2,001 mg by mouth 3 (three) times daily with meals.     Cinacalcet HCl (SENSIPAR PO) Take 1 capsule by mouth Every Tuesday,Thursday,and Saturday with dialysis.     clopidogrel (PLAVIX) 75 MG tablet TAKE 1 TABLET BY MOUTH EVERY DAY 90 tablet 1   diphenhydrAMINE (BENADRYL) 25 MG tablet Take 25 mg by mouth every 6 (six) hours as needed for allergies.     ezetimibe (ZETIA) 10 MG tablet TAKE 1 TABLET BY MOUTH EVERY DAY 90 tablet 3   latanoprost (XALATAN) 0.005 % ophthalmic  solution Place 1 drop into both eyes at bedtime.     pantoprazole (PROTONIX) 40 MG tablet TAKE 1 TABLET BY MOUTH EVERY DAY 90 tablet 3   PARoxetine (PAXIL) 40 MG tablet TAKE 1 TABLET BY MOUTH EVERY DAY 90 tablet 1   pravastatin (PRAVACHOL) 40 MG tablet TAKE 1 TABLET BY MOUTH EVERYDAY AT BEDTIME 90 tablet 3   sevelamer carbonate (RENVELA) 800 MG tablet Take 2,400 mg by mouth See admin instructions. Take 2400 mg with each meal and each snack     traZODone (DESYREL) 100 MG tablet TAKE 1 TABLET BY MOUTH EVERYDAY AT BEDTIME 90 tablet 1   triamcinolone cream (KENALOG) 0.1 % APPLY 1 APPLICATION TOPICALLY 3 (THREE) TIMES DAILY AS NEEDED (RASH). 60 g 1   No current facility-administered medications for this visit.    On ROS today: negative unless stated in HPI    Physical Examination   Vitals:   07/24/22 0840  BP: (!) 183/98  Pulse: 84  Temp: 98.2 F (36.8 C)  TempSrc: Temporal  SpO2: 96%  Weight: 172 lb (78 kg)   Body mass index is 28.62 kg/m.  General Alert and oriented  Pulmonary Non labored  Cardiac Regular rate and rhythm  Vascular Right AV fistula with good thrill. Some pulsatility. 2+ right radial pulse  Musculo- skeletal M/S 5/5 throughout , Extremities without ischemic changes    Neurologic A&O; CN grossly intact  Non-invasive Vascular Imaging   right Arm Access Duplex  (07/24/22):  Findings:  +--------------------+----------+-----------------+--------+  AVF                PSV (cm/s)Flow Vol (mL/min)Comments  +--------------------+----------+-----------------+--------+  Native artery inflow   187           743                 +--------------------+----------+-----------------+--------+  AVF Anastomosis        285                               +--------------------+----------+-----------------+--------+     +---------------+----------+-------------+----------+----------------------  ----+  OUTFLOW VEIN   PSV (cm/s)Diameter (cm)Depth (cm)          Describe            +---------------+----------+-------------+----------+----------------------  ----+  Subclavian vein    94                                                        +---------------+----------+-------------+----------+----------------------  ----+  Shoulder         638        0.79        1.26       Retained valve  and                                                               stenotic            +---------------+----------+-------------+----------+----------------------  ----+  Prox UA           205        0.58        0.67                                +---------------+----------+-------------+----------+----------------------  ----+  Mid UA            232        0.76        0.61                                +---------------+----------+-------------+----------+----------------------  ----+  Dist UA           622        0.63        0.29   competing branch  measuring                                                  0.25. Wall  irregularity.   +---------------+----------+-------------+----------+----------------------  ----+  AC Fossa          536        0.46        0.23                                +---------------+----------+-------------+----------+----------------------  ----+  Summary:  Arteriovenous fistula-Elevated velocities noted in the outflow vein at the shoulder in the area of a retained valve. Competing branch noted at the distal upper arm measuring 0.25cm.    Medical Decision Making   [name] is a 77 y.o. female who presents for new access evaluation at the request of Dr. Augustin Coupe. Her currently right brachiocephalic AV fistula has inflow stenosis that he feels is not amenable to further intervention. Duplex today shows patent fistula with stenosis in distal upper arm and also outflow vein with retained valve. She has current working right IJ TDC. She has an old vein mapping that suggest  he next best option is likely a right upper arm graft. However, I discussed with patient that I would recommend repeat vein mapping prior to proceeding. I will have her scheduled for upper extremity vein mapping and have her return in near future to discuss results and discuss next steps with Dr. Scot Dock.   Karoline Caldwell, PA-C [facility] Office: [phone]  On call MD: Carlis Abbott

## 2022-07-24 NOTE — Telephone Encounter (Signed)
Patient showed up to appt with dialysis and PA 07/24/2022.

## 2022-07-25 ENCOUNTER — Other Ambulatory Visit: Payer: Self-pay

## 2022-07-25 DIAGNOSIS — Z992 Dependence on renal dialysis: Secondary | ICD-10-CM

## 2022-08-07 ENCOUNTER — Encounter (HOSPITAL_COMMUNITY): Payer: Self-pay | Admitting: *Deleted

## 2022-08-10 ENCOUNTER — Telehealth: Payer: Self-pay | Admitting: *Deleted

## 2022-08-10 NOTE — Telephone Encounter (Signed)
Spoke with patient and she was given detailed instructions about her STRESS TEST on 08/11/22.

## 2022-08-11 ENCOUNTER — Encounter: Payer: Self-pay | Admitting: Vascular Surgery

## 2022-08-11 ENCOUNTER — Ambulatory Visit (HOSPITAL_BASED_OUTPATIENT_CLINIC_OR_DEPARTMENT_OTHER): Payer: 59

## 2022-08-11 ENCOUNTER — Ambulatory Visit (HOSPITAL_COMMUNITY)
Admission: RE | Admit: 2022-08-11 | Discharge: 2022-08-11 | Disposition: A | Discharge status: Home / Self Care | Payer: 59 | Source: Ambulatory Visit | Attending: Vascular Surgery | Admitting: Vascular Surgery

## 2022-08-11 ENCOUNTER — Ambulatory Visit (INDEPENDENT_AMBULATORY_CARE_PROVIDER_SITE_OTHER): Payer: 59 | Admitting: Vascular Surgery

## 2022-08-11 VITALS — BP 148/84 | HR 79 | Temp 97.2°F | Resp 14 | Ht 65.0 in | Wt 167.0 lb

## 2022-08-11 DIAGNOSIS — Z992 Dependence on renal dialysis: Secondary | ICD-10-CM

## 2022-08-11 DIAGNOSIS — N186 End stage renal disease: Secondary | ICD-10-CM | POA: Insufficient documentation

## 2022-08-11 DIAGNOSIS — R079 Chest pain, unspecified: Secondary | ICD-10-CM

## 2022-08-11 LAB — MYOCARDIAL PERFUSION IMAGING
LV dias vol: 57 mL (ref 46–106)
LV sys vol: 21 mL
Nuc Stress EF: 63 %
Peak HR: 93 {beats}/min
Rest HR: 79 {beats}/min
Rest Nuclear Isotope Dose: 10.2 mCi
SDS: 2
SRS: 4
SSS: 6
ST Depression (mm): 0 mm
Stress Nuclear Isotope Dose: 31.3 mCi
TID: 0.09

## 2022-08-11 LAB — ECHOCARDIOGRAM COMPLETE
Area-P 1/2: 3.53 cm2
Calc EF: 57.9 %
Height: 65 in
MV M vel: 6.12 m/s
MV Peak grad: 149.8 mmHg
Radius: 0.4 cm
S' Lateral: 2.2 cm
Single Plane A2C EF: 54.6 %
Single Plane A4C EF: 61.2 %
Weight: 2768 oz

## 2022-08-11 MED ORDER — REGADENOSON 0.4 MG/5ML IV SOLN
0.4000 mg | Freq: Once | INTRAVENOUS | Status: AC
  Administered 2022-08-11: 0.4 mg via INTRAVENOUS

## 2022-08-11 MED ORDER — TECHNETIUM TC 99M TETROFOSMIN IV KIT
10.2000 | PACK | Freq: Once | INTRAVENOUS | Status: AC | PRN
  Administered 2022-08-11: 10.2 via INTRAVENOUS

## 2022-08-11 MED ORDER — TECHNETIUM TC 99M TETROFOSMIN IV KIT
31.3000 | PACK | Freq: Once | INTRAVENOUS | Status: AC | PRN
  Administered 2022-08-11: 31.3 via INTRAVENOUS

## 2022-08-11 NOTE — H&P (View-Only) (Signed)
 Patient ID: [name], female   DOB: 06/06/1945, 77 y.o.   MRN: [id number]  Reason for Consult: Follow-up (F/U)   Referred by Eubanks, Jessica K, NP  Subjective:     HPI:  [name] is a 77 y.o. female history of end-stage renal disease currently on dialysis via right IJ catheter.  She has a right arm AV fistula placed just over 6 months ago and has had multiple attempts at endovascular treatment of this but her dialysis center has significant difficulty cannulating.  She is not having any right hand pain and she is right-hand dominant.  She is here to day to discuss further access with a failed left upper arm AV graft in the past.  Currently dialyzing Tuesdays, Thursdays and Saturdays.  Past Medical History:  Diagnosis Date   Anemia    Anxiety    Arthritis    lower back and knees    Asthma    childhood   Barrett's esophagus    CAD (coronary artery disease)    Chronic kidney disease    Coagulation defect    Depression    Diabetes mellitus    Dysphagia    Eczema    End stage renal disease    Fluid overload, unspecified    History of herpes zoster 02/2010   Recovered fully after period of acute herpetic neuralgia tx w/ gabapentin.    Hx MRSA infection 2009   Hypercalcemia    Hyperlipidemia    Hypertension    Moderate protein-calorie malnutrition    Neuromuscular disorder    chronic pain   Personal history of colonic polyps 10/17/2010   hyperplastic   Family History  Problem Relation Age of Onset   Hypertension Father    Kidney disease Father    Diabetes Brother    Colon cancer Neg Hx    CAD Neg Hx    Rectal cancer Neg Hx    Stomach cancer Neg Hx    Past Surgical History:  Procedure Laterality Date   ABDOMINAL HYSTERECTOMY     unclear when   AV FISTULA PLACEMENT Left 02/13/2017   Procedure: ARTERIOVENOUS (AV) GORE-TEX STRETCH GRAFT INSERTION INTO LEFT ARM;  Surgeon: Fields, Charles E, MD;  Location: [facility] OR;  Service: Vascular;  Laterality: Left;    AV FISTULA PLACEMENT Right 01/16/2022   Procedure: RIGHT ARM ARTERIOVENOUS (AV) FISTULA;  Surgeon: Dickson, Christopher S, MD;  Location: [facility] OR;  Service: Vascular;  Laterality: Right;   CATARACT EXTRACTION Bilateral    COLONOSCOPY     IR FLUORO GUIDE CV LINE RIGHT  01/09/2022   IR US GUIDE VASC ACCESS RIGHT  01/09/2022   OTHER SURGICAL HISTORY     Catherer placed by [facility], right upper chest area.   thigh surg     left side due to MRSA   UPPER GASTROINTESTINAL ENDOSCOPY      Short Social History:  Social History   Tobacco Use   Smoking status: Every Day    Packs/day: 0.50    Years: 40.00    Additional pack years: 0.00    Total pack years: 20.00    Types: Cigarettes    Passive exposure: Never   Smokeless tobacco: Never   Tobacco comments:    smokes 1 pack of cigerettes a week- off and on  Substance Use Topics   Alcohol use: Yes    Alcohol/week: 2.0 standard drinks of alcohol    Types: 2 Standard drinks or equivalent per week      Comment: wine and beer- occasionally     Allergies  Allergen Reactions   Lisinopril Other (See Comments)    Abnormal Kidney Function    Pioglitazone Other (See Comments)    REACTION: Desquamation of skin of the palm   Morphine And Related Nausea And Vomiting   Sulfonamide Derivatives Rash    Current Outpatient Medications  Medication Sig Dispense Refill   acetaminophen (TYLENOL) 500 MG tablet Take 1 tablet (500 mg total) by mouth every 6 (six) hours as needed. (Patient taking differently: Take 1,000 mg by mouth every 6 (six) hours as needed for moderate pain.) 30 tablet 0   aspirin 81 MG chewable tablet Chew 1 tablet (81 mg total) by mouth daily. 30 tablet 0   B Complex-C-Zn-Folic Acid (DIALYVITE 800 WITH ZINC) 0.8 MG TABS Take 1 tablet by mouth Every Tuesday,Thursday,and Saturday with dialysis.     calcium acetate (PHOSLO) 667 MG capsule Take 2,001 mg by mouth 3 (three) times daily with meals.     Cinacalcet HCl (SENSIPAR PO) Take 1 capsule  by mouth Every Tuesday,Thursday,and Saturday with dialysis.     clopidogrel (PLAVIX) 75 MG tablet TAKE 1 TABLET BY MOUTH EVERY DAY 90 tablet 1   diphenhydrAMINE (BENADRYL) 25 MG tablet Take 25 mg by mouth every 6 (six) hours as needed for allergies.     ezetimibe (ZETIA) 10 MG tablet TAKE 1 TABLET BY MOUTH EVERY DAY 90 tablet 3   latanoprost (XALATAN) 0.005 % ophthalmic solution Place 1 drop into both eyes at bedtime.     pantoprazole (PROTONIX) 40 MG tablet TAKE 1 TABLET BY MOUTH EVERY DAY 90 tablet 3   PARoxetine (PAXIL) 40 MG tablet TAKE 1 TABLET BY MOUTH EVERY DAY 90 tablet 1   pravastatin (PRAVACHOL) 40 MG tablet TAKE 1 TABLET BY MOUTH EVERYDAY AT BEDTIME 90 tablet 3   sevelamer carbonate (RENVELA) 800 MG tablet Take 2,400 mg by mouth See admin instructions. Take 2400 mg with each meal and each snack     traZODone (DESYREL) 100 MG tablet TAKE 1 TABLET BY MOUTH EVERYDAY AT BEDTIME 90 tablet 1   triamcinolone cream (KENALOG) 0.1 % APPLY 1 APPLICATION TOPICALLY 3 (THREE) TIMES DAILY AS NEEDED (RASH). 60 g 1   No current facility-administered medications for this visit.    Review of Systems  Constitutional:  Constitutional negative. HENT: HENT negative.  Eyes: Eyes negative.  Respiratory: Respiratory negative.  Cardiovascular: Cardiovascular negative.  GI: Gastrointestinal negative.  Musculoskeletal: Musculoskeletal negative.  Neurological: Neurological negative. Hematologic: Hematologic/lymphatic negative.  Psychiatric: Psychiatric negative.        Objective:  Objective   Vitals:   08/11/22 0836  BP: (!) 148/84  Pulse: 79  Resp: 14  Temp: (!) 97.2 F (36.2 C)  TempSrc: Temporal  SpO2: 93%  Weight: 167 lb (75.8 kg)  Height: 5' 5" (1.651 m)   Body mass index is 27.79 kg/m.  Physical Exam HENT:     Nose: Nose normal.  Eyes:     Pupils: Pupils are equal, round, and reactive to light.  Cardiovascular:     Pulses:          Radial pulses are 2+ on the right side and  2+ on the left side.  Abdominal:     General: Abdomen is flat.     Palpations: Abdomen is soft.  Musculoskeletal:     Comments: Very strong thrill right upper arm although fistula feels somewhat diminutive and tortuous  Neurological:     General: No focal   deficit present.     Mental Status: She is alert.  Psychiatric:        Mood and Affect: Mood normal.     Data: +-----------------+-------------+----------+---------------+  Right Cephalic   Diameter (cm)Depth (cm)   Findings      +-----------------+-------------+----------+---------------+  Shoulder            0.51        0.75                    +-----------------+-------------+----------+---------------+  Prox upper arm       0.53        0.51                    +-----------------+-------------+----------+---------------+  Mid upper arm        0.66        0.40                    +-----------------+-------------+----------+---------------+  Dist upper arm       0.63        0.36      branching     +-----------------+-------------+----------+---------------+  Antecubital fossa                       AVF anastomosis  +-----------------+-------------+----------+---------------+   +-----------------+-------------+----------+---------+  Right Basilic    Diameter (cm)Depth (cm)Findings   +-----------------+-------------+----------+---------+  Mid upper arm        0.42                          +-----------------+-------------+----------+---------+  Dist upper arm       0.49               branching  +-----------------+-------------+----------+---------+  Antecubital fossa    0.32                          +-----------------+-------------+----------+---------+  Prox forearm         0.22                          +-----------------+-------------+----------+---------+   Right brachiocephalic AVF anastomosis visualized in the right AC fossa  with elevated velocities of 601cm/s.  Immediately distal to the  anastomosis, there is a kink with velocities of 659 cm/s.    +-----------------+-------------+----------+--------+  Left Cephalic    Diameter (cm)Depth (cm)Findings  +-----------------+-------------+----------+--------+  Prox upper arm       0.17        0.75             +-----------------+-------------+----------+--------+  Mid upper arm        0.22        0.60             +-----------------+-------------+----------+--------+  Dist upper arm       0.22        0.27             +-----------------+-------------+----------+--------+  Antecubital fossa    0.25        0.14             +-----------------+-------------+----------+--------+  Prox forearm         0.21        0.16             +-----------------+-------------+----------+--------+  Mid forearm          0.17          0.39             +-----------------+-------------+----------+--------+  Dist forearm         0.15        0.26             +-----------------+-------------+----------+--------+   +-----------------+-------------+----------+---------+  Left Basilic     Diameter (cm)Depth (cm)Findings   +-----------------+-------------+----------+---------+  Mid upper arm        0.35                          +-----------------+-------------+----------+---------+  Dist upper arm       0.30                          +-----------------+-------------+----------+---------+  Antecubital fossa    0.22               branching  +-----------------+-------------+----------+---------+  Prox forearm         0.18                          +-----------------+-------------+----------+---------+       Assessment/Plan:     76-year-old female with a nonfunctioning right arm AV fistula but has a very strong thrill with great flow but appears may be too small serpentine for cannulation.  She has undergone multiple attempts with Dr. Lin at endovascular treatment to no  avail.  As such I recommended consideration of conversion to an interposition graft which would be much easier to cannulate and if this is not successful we could plan for right arm basilic vein fistula at the time of the surgery.  We will get this scheduled on a nondialysis day in the near future.     Zebbie Ace Christopher Cheynne Virden MD [facility]   

## 2022-08-11 NOTE — Progress Notes (Signed)
Patient ID: [name], female   DOB: 1946/01/31, 77 y.o.   MRN: [id number]  Reason for Consult: Follow-up (F/U)   Referred by Sharon Seller, NP  Subjective:     HPI:  [name] is a 77 y.o. female history of end-stage renal disease currently on dialysis via right IJ catheter.  She has a right arm AV fistula placed just over 6 months ago and has had multiple attempts at endovascular treatment of this but her dialysis center has significant difficulty cannulating.  She is not having any right hand pain and she is right-hand dominant.  She is here to day to discuss further access with a failed left upper arm AV graft in the past.  Currently dialyzing Tuesdays, Thursdays and Saturdays.  Past Medical History:  Diagnosis Date   Anemia    Anxiety    Arthritis    lower back and knees    Asthma    childhood   Barrett's esophagus    CAD (coronary artery disease)    Chronic kidney disease    Coagulation defect    Depression    Diabetes mellitus    Dysphagia    Eczema    End stage renal disease    Fluid overload, unspecified    History of herpes zoster 02/2010   Recovered fully after period of acute herpetic neuralgia tx w/ gabapentin.    Hx MRSA infection 2009   Hypercalcemia    Hyperlipidemia    Hypertension    Moderate protein-calorie malnutrition    Neuromuscular disorder    chronic pain   Personal history of colonic polyps 10/17/2010   hyperplastic   Family History  Problem Relation Age of Onset   Hypertension Father    Kidney disease Father    Diabetes Brother    Colon cancer Neg Hx    CAD Neg Hx    Rectal cancer Neg Hx    Stomach cancer Neg Hx    Past Surgical History:  Procedure Laterality Date   ABDOMINAL HYSTERECTOMY     unclear when   AV FISTULA PLACEMENT Left 02/13/2017   Procedure: ARTERIOVENOUS (AV) GORE-TEX STRETCH GRAFT INSERTION INTO LEFT ARM;  Surgeon: Sherren Kerns, MD;  Location: [facility] OR;  Service: Vascular;  Laterality: Left;    AV FISTULA PLACEMENT Right 01/16/2022   Procedure: RIGHT ARM ARTERIOVENOUS (AV) FISTULA;  Surgeon: Chuck Hint, MD;  Location: [facility] OR;  Service: Vascular;  Laterality: Right;   CATARACT EXTRACTION Bilateral    COLONOSCOPY     IR FLUORO GUIDE CV LINE RIGHT  01/09/2022   IR US GUIDE VASC ACCESS RIGHT  01/09/2022   OTHER SURGICAL HISTORY     Catherer placed by [facility], right upper chest area.   thigh surg     left side due to MRSA   UPPER GASTROINTESTINAL ENDOSCOPY      Short Social History:  Social History   Tobacco Use   Smoking status: Every Day    Packs/day: 0.50    Years: 40.00    Additional pack years: 0.00    Total pack years: 20.00    Types: Cigarettes    Passive exposure: Never   Smokeless tobacco: Never   Tobacco comments:    smokes 1 pack of cigerettes a week- off and on  Substance Use Topics   Alcohol use: Yes    Alcohol/week: 2.0 standard drinks of alcohol    Types: 2 Standard drinks or equivalent per week  Comment: wine and beer- occasionally     Allergies  Allergen Reactions   Lisinopril Other (See Comments)    Abnormal Kidney Function    Pioglitazone Other (See Comments)    REACTION: Desquamation of skin of the palm   Morphine And Related Nausea And Vomiting   Sulfonamide Derivatives Rash    Current Outpatient Medications  Medication Sig Dispense Refill   acetaminophen (TYLENOL) 500 MG tablet Take 1 tablet (500 mg total) by mouth every 6 (six) hours as needed. (Patient taking differently: Take 1,000 mg by mouth every 6 (six) hours as needed for moderate pain.) 30 tablet 0   aspirin 81 MG chewable tablet Chew 1 tablet (81 mg total) by mouth daily. 30 tablet 0   B Complex-C-Zn-Folic Acid (DIALYVITE 800 WITH ZINC) 0.8 MG TABS Take 1 tablet by mouth Every Tuesday,Thursday,and Saturday with dialysis.     calcium acetate (PHOSLO) 667 MG capsule Take 2,001 mg by mouth 3 (three) times daily with meals.     Cinacalcet HCl (SENSIPAR PO) Take 1 capsule  by mouth Every Tuesday,Thursday,and Saturday with dialysis.     clopidogrel (PLAVIX) 75 MG tablet TAKE 1 TABLET BY MOUTH EVERY DAY 90 tablet 1   diphenhydrAMINE (BENADRYL) 25 MG tablet Take 25 mg by mouth every 6 (six) hours as needed for allergies.     ezetimibe (ZETIA) 10 MG tablet TAKE 1 TABLET BY MOUTH EVERY DAY 90 tablet 3   latanoprost (XALATAN) 0.005 % ophthalmic solution Place 1 drop into both eyes at bedtime.     pantoprazole (PROTONIX) 40 MG tablet TAKE 1 TABLET BY MOUTH EVERY DAY 90 tablet 3   PARoxetine (PAXIL) 40 MG tablet TAKE 1 TABLET BY MOUTH EVERY DAY 90 tablet 1   pravastatin (PRAVACHOL) 40 MG tablet TAKE 1 TABLET BY MOUTH EVERYDAY AT BEDTIME 90 tablet 3   sevelamer carbonate (RENVELA) 800 MG tablet Take 2,400 mg by mouth See admin instructions. Take 2400 mg with each meal and each snack     traZODone (DESYREL) 100 MG tablet TAKE 1 TABLET BY MOUTH EVERYDAY AT BEDTIME 90 tablet 1   triamcinolone cream (KENALOG) 0.1 % APPLY 1 APPLICATION TOPICALLY 3 (THREE) TIMES DAILY AS NEEDED (RASH). 60 g 1   No current facility-administered medications for this visit.    Review of Systems  Constitutional:  Constitutional negative. HENT: HENT negative.  Eyes: Eyes negative.  Respiratory: Respiratory negative.  Cardiovascular: Cardiovascular negative.  GI: Gastrointestinal negative.  Musculoskeletal: Musculoskeletal negative.  Neurological: Neurological negative. Hematologic: Hematologic/lymphatic negative.  Psychiatric: Psychiatric negative.        Objective:  Objective   Vitals:   08/11/22 0836  BP: (!) 148/84  Pulse: 79  Resp: 14  Temp: (!) 97.2 F (36.2 C)  TempSrc: Temporal  SpO2: 93%  Weight: 167 lb (75.8 kg)  Height: 5\' 5"  (1.651 m)   Body mass index is 27.79 kg/m.  Physical Exam HENT:     Nose: Nose normal.  Eyes:     Pupils: Pupils are equal, round, and reactive to light.  Cardiovascular:     Pulses:          Radial pulses are 2+ on the right side and  2+ on the left side.  Abdominal:     General: Abdomen is flat.     Palpations: Abdomen is soft.  Musculoskeletal:     Comments: Very strong thrill right upper arm although fistula feels somewhat diminutive and tortuous  Neurological:     General: No focal  deficit present.     Mental Status: She is alert.  Psychiatric:        Mood and Affect: Mood normal.     Data: +-----------------+-------------+----------+---------------+  Right Cephalic   Diameter (cm)Depth (cm)   Findings      +-----------------+-------------+----------+---------------+  Shoulder            0.51        0.75                    +-----------------+-------------+----------+---------------+  Prox upper arm       0.53        0.51                    +-----------------+-------------+----------+---------------+  Mid upper arm        0.66        0.40                    +-----------------+-------------+----------+---------------+  Dist upper arm       0.63        0.36      branching     +-----------------+-------------+----------+---------------+  Antecubital fossa                       AVF anastomosis  +-----------------+-------------+----------+---------------+   +-----------------+-------------+----------+---------+  Right Basilic    Diameter (cm)Depth (cm)Findings   +-----------------+-------------+----------+---------+  Mid upper arm        0.42                          +-----------------+-------------+----------+---------+  Dist upper arm       0.49               branching  +-----------------+-------------+----------+---------+  Antecubital fossa    0.32                          +-----------------+-------------+----------+---------+  Prox forearm         0.22                          +-----------------+-------------+----------+---------+   Right brachiocephalic AVF anastomosis visualized in the right AC fossa  with elevated velocities of 601cm/s.  Immediately distal to the  anastomosis, there is a kink with velocities of 659 cm/s.    +-----------------+-------------+----------+--------+  Left Cephalic    Diameter (cm)Depth (cm)Findings  +-----------------+-------------+----------+--------+  Prox upper arm       0.17        0.75             +-----------------+-------------+----------+--------+  Mid upper arm        0.22        0.60             +-----------------+-------------+----------+--------+  Dist upper arm       0.22        0.27             +-----------------+-------------+----------+--------+  Antecubital fossa    0.25        0.14             +-----------------+-------------+----------+--------+  Prox forearm         0.21        0.16             +-----------------+-------------+----------+--------+  Mid forearm          0.17  0.39             +-----------------+-------------+----------+--------+  Dist forearm         0.15        0.26             +-----------------+-------------+----------+--------+   +-----------------+-------------+----------+---------+  Left Basilic     Diameter (cm)Depth (cm)Findings   +-----------------+-------------+----------+---------+  Mid upper arm        0.35                          +-----------------+-------------+----------+---------+  Dist upper arm       0.30                          +-----------------+-------------+----------+---------+  Antecubital fossa    0.22               branching  +-----------------+-------------+----------+---------+  Prox forearm         0.18                          +-----------------+-------------+----------+---------+       Assessment/Plan:     77 year old female with a nonfunctioning right arm AV fistula but has a very strong thrill with great flow but appears may be too small serpentine for cannulation.  She has undergone multiple attempts with Dr. Juel Burrow at endovascular treatment to no  avail.  As such I recommended consideration of conversion to an interposition graft which would be much easier to cannulate and if this is not successful we could plan for right arm basilic vein fistula at the time of the surgery.  We will get this scheduled on a nondialysis day in the near future.     Maeola Harman MD [facility]

## 2022-08-14 ENCOUNTER — Other Ambulatory Visit: Payer: Self-pay

## 2022-08-14 ENCOUNTER — Telehealth: Payer: Self-pay | Admitting: Cardiovascular Disease

## 2022-08-14 DIAGNOSIS — I340 Nonrheumatic mitral (valve) insufficiency: Secondary | ICD-10-CM

## 2022-08-14 DIAGNOSIS — N186 End stage renal disease: Secondary | ICD-10-CM

## 2022-08-14 NOTE — Telephone Encounter (Signed)
-----   Message from Kathleene Hazel, MD sent at 08/14/2022 12:25 PM EDT ----- Her heart is strong. Moderate leakiness of the mitral valve. Will need repeat echo in one year. Also see normal stress test. Her chest pain is most likely not cardiac related. Thayer Ohm

## 2022-08-14 NOTE — Telephone Encounter (Signed)
Spoke with patient who verbalized understanding of ECHO results and need to repeat next year to monitor mitral regurgitation. Order placed at this time. No further questions.

## 2022-08-23 ENCOUNTER — Encounter (HOSPITAL_COMMUNITY): Payer: Self-pay | Admitting: *Deleted

## 2022-08-31 ENCOUNTER — Encounter (HOSPITAL_COMMUNITY): Payer: Self-pay | Admitting: Vascular Surgery

## 2022-08-31 NOTE — Anesthesia Preprocedure Evaluation (Addendum)
Anesthesia Evaluation  Patient identified by MRN, date of birth, ID band Patient awake    Reviewed: Allergy & Precautions, H&P , NPO status , Patient's Chart, lab work & pertinent test results  Airway Mallampati: II  TM Distance: >3 FB Neck ROM: Full    Dental no notable dental hx. (+) Edentulous Upper, Edentulous Lower, Dental Advisory Given   Pulmonary asthma , Current Smoker and Patient abstained from smoking.   Pulmonary exam normal breath sounds clear to auscultation       Cardiovascular hypertension, Pt. on medications + CAD   Rhythm:Regular Rate:Normal  Echo 08/11/2022: IMPRESSIONS  1. Left ventricular ejection fraction, by estimation, is 60 to 65%. The  left ventricle has normal function. The left ventricle has no regional  wall motion abnormalities. There is mild left ventricular hypertrophy.  Left ventricular diastolic parameters  are consistent with Grade I diastolic dysfunction (impaired relaxation).  2. Right ventricular systolic function is normal. The right ventricular  size is normal. There is normal pulmonary artery systolic pressure.  3. PISA 0.4cm. The mitral valve is normal in structure. Moderate to  severe mitral valve regurgitation. No evidence of mitral stenosis.  4. The aortic valve is tricuspid. There is mild calcification of the  aortic valve. There is mild thickening of the aortic valve. Aortic valve  regurgitation is not visualized. Aortic valve sclerosis/calcification is  present, without any evidence of  aortic stenosis.  5. The inferior vena cava is normal in size with greater than 50%  respiratory variability, suggesting right atrial pressure of 3 mmHg.    Nuclear stress test 08/11/2022:   The study is normal. The study is low risk.   No ST deviation was noted.   LV perfusion is normal.   Left ventricular function is normal. End diastolic cavity size is normal.   Prior study not  available for comparison.  Low risk stress nuclear study with normal perfusion and normal left ventricular regional and global systolic function.     Neuro/Psych  Headaches  Anxiety Depression       GI/Hepatic Neg liver ROS,GERD  Medicated,,  Endo/Other  diabetes    Renal/GU ESRF and DialysisRenal disease  negative genitourinary   Musculoskeletal  (+) Arthritis , Osteoarthritis,    Abdominal   Peds  Hematology  (+) Blood dyscrasia, anemia   Anesthesia Other Findings   Reproductive/Obstetrics negative OB ROS                             Anesthesia Physical Anesthesia Plan  ASA: 3  Anesthesia Plan: MAC and Regional   Post-op Pain Management: Tylenol PO (pre-op)*   Induction: Intravenous  PONV Risk Score and Plan: 1 and Propofol infusion  Airway Management Planned: Natural Airway and Simple Face Mask  Additional Equipment:   Intra-op Plan:   Post-operative Plan:   Informed Consent: I have reviewed the patients History and Physical, chart, labs and discussed the procedure including the risks, benefits and alternatives for the proposed anesthesia with the patient or authorized representative who has indicated his/her understanding and acceptance.     Dental advisory given  Plan Discussed with: CRNA  Anesthesia Plan Comments: (PAT note written 08/31/2022 by Shonna Chock, PA-C.  )       Anesthesia Quick Evaluation

## 2022-08-31 NOTE — Progress Notes (Signed)
Anesthesia Chart Review: [name]  Case: [id number] Date/Time: 09/01/22 1231   Procedure: INSERTION OF ARTERIOVENOUS (AV) GORE-TEX GRAFT VERSUS ARTERIOVENOUS FISTULA RIGHT ARM (Right)   Anesthesia type: Choice   Pre-op diagnosis: End Stage Renal Disease   [facility] ROOM 12 / [facility]   Surgeons: Maeola Harman, MD       DISCUSSION: Patient is a 77 year old female scheduled for the above procedure. She has a nonfunctioning RUE AVF--too small for cannulation and needs new access.  As of 08/11/2022, a right IJ TDC was being used for dialysis.  History includes smoking, HTN, HLD, DM2, CAD (coronary calcifications on 03/2020 chest CT), mitral regurgitation (moderate-severe MR 07/2022 echo), asthma, ESRD (HD TTS), CVA (~ 2018), anemia, chronic pain.  She had a stress test and echocardiogram on 08/11/22. Stress test was normal/low risk. Echo showed LVEF 60-65%, no RWMA, mild LVH, grade 1 DD, normal RVSF, normal PASP,  moderate to severe MR with one year follow-up planned per Dr. Clifton James.   Appears she is on Plavix since CVA ~ 2018. Plavix is listed as currently being on hold. Anesthesia team to evaluate on the day of surgery.   VS:  BP Readings from Last 3 Encounters:  08/11/22 (!) 148/84  07/24/22 (!) 183/98  07/14/22 130/70   Pulse Readings from Last 3 Encounters:  08/11/22 79  07/24/22 84  07/14/22 85     PROVIDERS: Sharon Seller, NP is PCP  Verne Carrow, MD is cardiologist   LABS: For day of surgery. Last results in [facility] include: Lab Results  Component Value Date   WBC 8.1 01/08/2022   HGB 10.6 (A) 05/24/2022   HCT 30.0 (L) 01/16/2022   PLT 154 01/08/2022   GLUCOSE 85 01/16/2022   ALT 21 01/08/2022   AST 32 01/08/2022   NA 139 01/16/2022   K 4.3 05/24/2022   CL 101 01/16/2022   CREATININE 10.80 (H) 01/16/2022   BUN 39 (H) 01/16/2022   CO2 28 01/08/2022   HGBA1C 5.3 11/10/2021     IMAGES: 1V PCXR 01/08/2022: IMPRESSION: 1. Dialysis  catheter overlying the lower chest, likely external to the patient after removal. This does not follow the normal course of vascular structures in the chest. No other radiopaque catheter fragments are identified on this exam. 2. Otherwise no acute intrathoracic process.   EKG: 07/14/2022: Normal sinus rhythm Indeterminate axis Inferior infarct, age undetermined   CV: Echo 08/11/2022: IMPRESSIONS   1. Left ventricular ejection fraction, by estimation, is 60 to 65%. The  left ventricle has normal function. The left ventricle has no regional  wall motion abnormalities. There is mild left ventricular hypertrophy.  Left ventricular diastolic parameters  are consistent with Grade I diastolic dysfunction (impaired relaxation).   2. Right ventricular systolic function is normal. The right ventricular  size is normal. There is normal pulmonary artery systolic pressure.   3. PISA 0.4cm. The mitral valve is normal in structure. Moderate to  severe mitral valve regurgitation. No evidence of mitral stenosis.   4. The aortic valve is tricuspid. There is mild calcification of the  aortic valve. There is mild thickening of the aortic valve. Aortic valve  regurgitation is not visualized. Aortic valve sclerosis/calcification is  present, without any evidence of  aortic stenosis.   5. The inferior vena cava is normal in size with greater than 50%  respiratory variability, suggesting right atrial pressure of 3 mmHg.    Nuclear stress test 08/11/2022:   The study is  normal. The study is low risk.   No ST deviation was noted.   LV perfusion is normal.   Left ventricular function is normal. End diastolic cavity size is normal.   Prior study not available for comparison.   Low risk stress nuclear study with normal perfusion and normal left ventricular regional and global systolic function.   U/S Carotid 12/03/2016: Summary:  Bilateral: soft plaque throughout CCA origin and proximal ICA and  ECA.  1-39% ICA plaquing. Vertebral artery flow is antegrade.    Past Medical History:  Diagnosis Date   Anemia    Anxiety    Arthritis    lower back and knees    Asthma    childhood   Barrett's esophagus    CAD (coronary artery disease)    Chronic kidney disease    Coagulation defect (HCC)    Depression    Diabetes mellitus    Dysphagia    Eczema    End stage renal disease (HCC)    Fluid overload, unspecified    History of herpes zoster 02/2010   Recovered fully after period of acute herpetic neuralgia tx w/ gabapentin.    Hx MRSA infection 2009   Hypercalcemia    Hyperlipidemia    Hypertension    Moderate protein-calorie malnutrition (HCC)    Neuromuscular disorder (HCC)    chronic pain   Personal history of colonic polyps 10/17/2010   hyperplastic    Past Surgical History:  Procedure Laterality Date   ABDOMINAL HYSTERECTOMY     unclear when   AV FISTULA PLACEMENT Left 02/13/2017   Procedure: ARTERIOVENOUS (AV) GORE-TEX STRETCH GRAFT INSERTION INTO LEFT ARM;  Surgeon: Sherren Kerns, MD;  Location: [facility] OR;  Service: Vascular;  Laterality: Left;   AV FISTULA PLACEMENT Right 01/16/2022   Procedure: RIGHT ARM ARTERIOVENOUS (AV) FISTULA;  Surgeon: Chuck Hint, MD;  Location: [facility] OR;  Service: Vascular;  Laterality: Right;   CATARACT EXTRACTION Bilateral    COLONOSCOPY     IR FLUORO GUIDE CV LINE RIGHT  01/09/2022   IR US GUIDE VASC ACCESS RIGHT  01/09/2022   OTHER SURGICAL HISTORY     Catherer placed by [facility], right upper chest area.   thigh surg     left side due to MRSA   UPPER GASTROINTESTINAL ENDOSCOPY      MEDICATIONS: No current facility-administered medications for this encounter.    aspirin 81 MG chewable tablet   B Complex-C-Zn-Folic Acid (DIALYVITE 800 WITH ZINC) 0.8 MG TABS   calcium acetate (PHOSLO) 667 MG capsule   clopidogrel (PLAVIX) 75 MG tablet   ezetimibe (ZETIA) 10 MG tablet   latanoprost (XALATAN) 0.005 % ophthalmic solution    pantoprazole (PROTONIX) 40 MG tablet   PARoxetine (PAXIL) 40 MG tablet   pravastatin (PRAVACHOL) 40 MG tablet   sevelamer carbonate (RENVELA) 800 MG tablet   acetaminophen (TYLENOL) 500 MG tablet   Cinacalcet HCl (SENSIPAR PO)   diphenhydrAMINE (BENADRYL) 25 MG tablet   traZODone (DESYREL) 100 MG tablet   triamcinolone cream (KENALOG) 0.1 %    Shonna Chock, PA-C Surgical Short Stay/Anesthesiology [facility] Phone [phone] [facility] Phone [phone] 08/31/2022 10:36 AM

## 2022-08-31 NOTE — Progress Notes (Addendum)
Multiple attempts of 2 days to contact patient. Number on file goes directly to busy. Home phone does not work.  Emergency contact, her brother's number is not in service.  Reached out to Dr. Darcella Cheshire office for additional contact information.   Unsuccessful in contacting patient.  Lucianne Lei, RN from Dr. Darcella Cheshire office double checked the numbers.  She called the dialysis center but patient had already left.  States Patient is aware of arrival time of 0945, continue ASA and she was directed to hold Plavix for 5 days.    Better contact number including emergency contact numbers need to be obtained from patient.

## 2022-09-01 ENCOUNTER — Other Ambulatory Visit: Payer: Self-pay

## 2022-09-01 ENCOUNTER — Telehealth: Payer: Self-pay | Admitting: Physician Assistant

## 2022-09-01 ENCOUNTER — Ambulatory Visit (HOSPITAL_COMMUNITY)
Admission: RE | Admit: 2022-09-01 | Discharge: 2022-09-01 | Disposition: A | Discharge status: Home / Self Care | Payer: 59 | Attending: Vascular Surgery | Admitting: Vascular Surgery

## 2022-09-01 ENCOUNTER — Ambulatory Visit: Payer: 59 | Admitting: Cardiovascular Disease

## 2022-09-01 ENCOUNTER — Encounter (HOSPITAL_COMMUNITY)
Admission: RE | Disposition: A | Discharge status: Home / Self Care | Payer: Self-pay | Source: Home / Self Care | Attending: Vascular Surgery

## 2022-09-01 ENCOUNTER — Encounter (HOSPITAL_COMMUNITY): Payer: Self-pay | Admitting: Vascular Surgery

## 2022-09-01 ENCOUNTER — Ambulatory Visit (HOSPITAL_BASED_OUTPATIENT_CLINIC_OR_DEPARTMENT_OTHER): Payer: 59 | Admitting: Vascular Surgery

## 2022-09-01 ENCOUNTER — Ambulatory Visit (HOSPITAL_COMMUNITY): Payer: 59 | Admitting: Vascular Surgery

## 2022-09-01 DIAGNOSIS — Z992 Dependence on renal dialysis: Secondary | ICD-10-CM

## 2022-09-01 DIAGNOSIS — I120 Hypertensive chronic kidney disease with stage 5 chronic kidney disease or end stage renal disease: Secondary | ICD-10-CM

## 2022-09-01 DIAGNOSIS — I2510 Atherosclerotic heart disease of native coronary artery without angina pectoris: Secondary | ICD-10-CM | POA: Diagnosis not present

## 2022-09-01 DIAGNOSIS — N186 End stage renal disease: Secondary | ICD-10-CM

## 2022-09-01 DIAGNOSIS — Z8673 Personal history of transient ischemic attack (TIA), and cerebral infarction without residual deficits: Secondary | ICD-10-CM | POA: Insufficient documentation

## 2022-09-01 DIAGNOSIS — E1122 Type 2 diabetes mellitus with diabetic chronic kidney disease: Secondary | ICD-10-CM

## 2022-09-01 DIAGNOSIS — F17210 Nicotine dependence, cigarettes, uncomplicated: Secondary | ICD-10-CM | POA: Insufficient documentation

## 2022-09-01 DIAGNOSIS — I080 Rheumatic disorders of both mitral and aortic valves: Secondary | ICD-10-CM | POA: Insufficient documentation

## 2022-09-01 DIAGNOSIS — E785 Hyperlipidemia, unspecified: Secondary | ICD-10-CM | POA: Diagnosis not present

## 2022-09-01 DIAGNOSIS — F419 Anxiety disorder, unspecified: Secondary | ICD-10-CM | POA: Insufficient documentation

## 2022-09-01 DIAGNOSIS — T82898A Other specified complication of vascular prosthetic devices, implants and grafts, initial encounter: Secondary | ICD-10-CM | POA: Diagnosis not present

## 2022-09-01 DIAGNOSIS — G8929 Other chronic pain: Secondary | ICD-10-CM | POA: Diagnosis not present

## 2022-09-01 DIAGNOSIS — D631 Anemia in chronic kidney disease: Secondary | ICD-10-CM

## 2022-09-01 DIAGNOSIS — F32A Depression, unspecified: Secondary | ICD-10-CM | POA: Insufficient documentation

## 2022-09-01 DIAGNOSIS — T829XXA Unspecified complication of cardiac and vascular prosthetic device, implant and graft, initial encounter: Secondary | ICD-10-CM | POA: Diagnosis not present

## 2022-09-01 DIAGNOSIS — Z7902 Long term (current) use of antithrombotics/antiplatelets: Secondary | ICD-10-CM | POA: Diagnosis not present

## 2022-09-01 DIAGNOSIS — F17200 Nicotine dependence, unspecified, uncomplicated: Secondary | ICD-10-CM

## 2022-09-01 DIAGNOSIS — N185 Chronic kidney disease, stage 5: Secondary | ICD-10-CM | POA: Diagnosis not present

## 2022-09-01 HISTORY — DX: Nonrheumatic mitral (valve) insufficiency: I34.0

## 2022-09-01 HISTORY — PX: AV FISTULA PLACEMENT: SHX1204

## 2022-09-01 HISTORY — DX: Cerebral infarction, unspecified: I63.9

## 2022-09-01 LAB — POCT I-STAT, CHEM 8
BUN: 41 mg/dL — ABNORMAL HIGH (ref 8–23)
Calcium, Ion: 1.07 mmol/L — ABNORMAL LOW (ref 1.15–1.40)
Chloride: 99 mmol/L (ref 98–111)
Creatinine, Ser: 9.7 mg/dL — ABNORMAL HIGH (ref 0.44–1.00)
Glucose, Bld: 96 mg/dL (ref 70–99)
HCT: 33 % — ABNORMAL LOW (ref 36.0–46.0)
Hemoglobin: 11.2 g/dL — ABNORMAL LOW (ref 12.0–15.0)
Potassium: 3.8 mmol/L (ref 3.5–5.1)
Sodium: 140 mmol/L (ref 135–145)
TCO2: 32 mmol/L (ref 22–32)

## 2022-09-01 LAB — GLUCOSE, CAPILLARY
Glucose-Capillary: 109 mg/dL — ABNORMAL HIGH (ref 70–99)
Glucose-Capillary: 82 mg/dL (ref 70–99)
Glucose-Capillary: 90 mg/dL (ref 70–99)

## 2022-09-01 LAB — SURGICAL PCR SCREEN
MRSA, PCR: NEGATIVE
Staphylococcus aureus: NEGATIVE

## 2022-09-01 SURGERY — INSERTION OF ARTERIOVENOUS (AV) GORE-TEX GRAFT ARM
Anesthesia: Monitor Anesthesia Care | Laterality: Right

## 2022-09-01 MED ORDER — INSULIN ASPART 100 UNIT/ML IJ SOLN: 0.0000 [IU] | INTRAMUSCULAR | Status: DC | PRN

## 2022-09-01 MED ORDER — ORAL CARE MOUTH RINSE: 15.0000 mL | Freq: Once | OROMUCOSAL | Status: AC

## 2022-09-01 MED ORDER — 0.9 % SODIUM CHLORIDE (POUR BTL) OPTIME
TOPICAL | Status: DC | PRN
  Administered 2022-09-01: 1000 mL

## 2022-09-01 MED ORDER — ALBUMIN HUMAN 5 % IV SOLN: INTRAVENOUS | Status: DC | PRN

## 2022-09-01 MED ORDER — ACETAMINOPHEN 500 MG PO TABS
1000.0000 mg | ORAL_TABLET | Freq: Once | ORAL | Status: AC
  Administered 2022-09-01: 1000 mg via ORAL
  Filled 2022-09-01: qty 2

## 2022-09-01 MED ORDER — PHENYLEPHRINE 80 MCG/ML (10ML) SYRINGE FOR IV PUSH (FOR BLOOD PRESSURE SUPPORT)
PREFILLED_SYRINGE | INTRAVENOUS | Status: AC
  Filled 2022-09-01: qty 10

## 2022-09-01 MED ORDER — CHLORHEXIDINE GLUCONATE 0.12 % MT SOLN
15.0000 mL | Freq: Once | OROMUCOSAL | Status: AC
  Administered 2022-09-01: 15 mL via OROMUCOSAL
  Filled 2022-09-01: qty 15

## 2022-09-01 MED ORDER — CEFAZOLIN SODIUM-DEXTROSE 2-4 GM/100ML-% IV SOLN
2.0000 g | INTRAVENOUS | Status: AC
  Administered 2022-09-01: 2 g via INTRAVENOUS
  Filled 2022-09-01: qty 100

## 2022-09-01 MED ORDER — HEPARIN 6000 UNIT IRRIGATION SOLUTION
Status: AC
  Filled 2022-09-01: qty 500

## 2022-09-01 MED ORDER — MIDAZOLAM HCL 2 MG/2ML IJ SOLN
INTRAMUSCULAR | Status: AC
  Filled 2022-09-01: qty 2

## 2022-09-01 MED ORDER — BUPIVACAINE HCL (PF) 0.5 % IJ SOLN
INTRAMUSCULAR | Status: DC | PRN
  Administered 2022-09-01: 30 mL via PERINEURAL

## 2022-09-01 MED ORDER — CHLORHEXIDINE GLUCONATE 4 % EX SOLN: 60.0000 mL | Freq: Once | CUTANEOUS | Status: DC

## 2022-09-01 MED ORDER — SODIUM CHLORIDE 0.9 % IV SOLN: INTRAVENOUS | Status: DC

## 2022-09-01 MED ORDER — PHENYLEPHRINE 80 MCG/ML (10ML) SYRINGE FOR IV PUSH (FOR BLOOD PRESSURE SUPPORT)
PREFILLED_SYRINGE | INTRAVENOUS | Status: DC | PRN
  Administered 2022-09-01 (×2): 160 ug via INTRAVENOUS

## 2022-09-01 MED ORDER — LIDOCAINE-EPINEPHRINE (PF) 1 %-1:200000 IJ SOLN
INTRAMUSCULAR | Status: AC
  Filled 2022-09-01: qty 30

## 2022-09-01 MED ORDER — FENTANYL CITRATE (PF) 100 MCG/2ML IJ SOLN: 25.0000 ug | INTRAMUSCULAR | Status: DC | PRN

## 2022-09-01 MED ORDER — PROPOFOL 500 MG/50ML IV EMUL
INTRAVENOUS | Status: DC | PRN
  Administered 2022-09-01: 65 ug/kg/min via INTRAVENOUS

## 2022-09-01 MED ORDER — FENTANYL CITRATE (PF) 100 MCG/2ML IJ SOLN: 50.0000 ug | Freq: Once | INTRAMUSCULAR | Status: AC

## 2022-09-01 MED ORDER — PHENYLEPHRINE HCL-NACL 20-0.9 MG/250ML-% IV SOLN
INTRAVENOUS | Status: DC | PRN
  Administered 2022-09-01: 50 ug/min via INTRAVENOUS

## 2022-09-01 MED ORDER — HEPARIN 6000 UNIT IRRIGATION SOLUTION
Status: DC | PRN
  Administered 2022-09-01: 1

## 2022-09-01 MED ORDER — HYDROCODONE-ACETAMINOPHEN 5-325 MG PO TABS: 1.0000 | ORAL_TABLET | Freq: Four times a day (QID) | ORAL | 0 refills | Status: DC | PRN

## 2022-09-01 MED ORDER — FENTANYL CITRATE (PF) 100 MCG/2ML IJ SOLN
INTRAMUSCULAR | Status: AC
  Administered 2022-09-01: 50 ug via INTRAVENOUS
  Filled 2022-09-01: qty 2

## 2022-09-01 MED ORDER — LIDOCAINE-EPINEPHRINE (PF) 1 %-1:200000 IJ SOLN
INTRAMUSCULAR | Status: DC | PRN
  Administered 2022-09-01: 4 mL

## 2022-09-01 SURGICAL SUPPLY — 40 items
ADH SKN CLS APL DERMABOND .7 (GAUZE/BANDAGES/DRESSINGS) ×1
ADH SKN CLS LQ APL DERMABOND (GAUZE/BANDAGES/DRESSINGS) ×1
ARMBAND PINK RESTRICT EXTREMIT (MISCELLANEOUS) ×2 IMPLANT
BAG COUNTER SPONGE SURGICOUNT (BAG) ×1 IMPLANT
BAG SPNG CNTER NS LX DISP (BAG) ×1
CANISTER SUCT 3000ML PPV (MISCELLANEOUS) ×1 IMPLANT
CLIP LIGATING EXTRA MED SLVR (CLIP) ×1 IMPLANT
CLIP LIGATING EXTRA SM BLUE (MISCELLANEOUS) ×1 IMPLANT
DERMABOND ADVANCED .7 DNX12 (GAUZE/BANDAGES/DRESSINGS) ×1 IMPLANT
DERMABOND ADVANCED .7 DNX6 (GAUZE/BANDAGES/DRESSINGS) IMPLANT
ELECT REM PT RETURN 9FT ADLT (ELECTROSURGICAL) ×1
ELECTRODE REM PT RTRN 9FT ADLT (ELECTROSURGICAL) ×1 IMPLANT
GLOVE BIO SURGEON STRL SZ7.5 (GLOVE) ×1 IMPLANT
GOWN STRL REUS W/ TWL LRG LVL3 (GOWN DISPOSABLE) ×2 IMPLANT
GOWN STRL REUS W/ TWL XL LVL3 (GOWN DISPOSABLE) ×1 IMPLANT
GOWN STRL REUS W/TWL LRG LVL3 (GOWN DISPOSABLE) ×2
GOWN STRL REUS W/TWL XL LVL3 (GOWN DISPOSABLE) ×1
GRAFT COLLAGEN VASCULAR 6X15 (Vascular Products) ×1 IMPLANT
GRAFT COLLAGEN VASCULAR 6X19 (Vascular Products) IMPLANT
HEMOSTAT SNOW SURGICEL 2X4 (HEMOSTASIS) IMPLANT
INSERT FOGARTY SM (MISCELLANEOUS) ×1 IMPLANT
KIT BASIN OR (CUSTOM PROCEDURE TRAY) ×1 IMPLANT
KIT TURNOVER KIT B (KITS) ×1 IMPLANT
NS IRRIG 1000ML POUR BTL (IV SOLUTION) ×1 IMPLANT
PACK CV ACCESS (CUSTOM PROCEDURE TRAY) ×1 IMPLANT
PAD ARMBOARD 7.5X6 YLW CONV (MISCELLANEOUS) ×2 IMPLANT
POWDER SURGICEL 3.0 GRAM (HEMOSTASIS) IMPLANT
SLING ARM FOAM STRAP LRG (SOFTGOODS) IMPLANT
SLING ARM FOAM STRAP MED (SOFTGOODS) IMPLANT
SUT MNCRL AB 4-0 PS2 18 (SUTURE) IMPLANT
SUT PROLENE 6 0 BV (SUTURE) IMPLANT
SUT SILK 2 0 SH (SUTURE) IMPLANT
SUT VIC AB 3-0 SH 27 (SUTURE) ×3
SUT VIC AB 3-0 SH 27X BRD (SUTURE) ×2 IMPLANT
SYR TOOMEY 50ML (SYRINGE) IMPLANT
SYR TOOMEY IRRIG 70ML (MISCELLANEOUS) ×1
SYRINGE TOOMEY IRRIG 70ML (MISCELLANEOUS) IMPLANT
TOWEL GREEN STERILE (TOWEL DISPOSABLE) ×1 IMPLANT
UNDERPAD 30X36 HEAVY ABSORB (UNDERPADS AND DIAPERS) ×1 IMPLANT
WATER STERILE IRR 1000ML POUR (IV SOLUTION) ×1 IMPLANT

## 2022-09-01 NOTE — Telephone Encounter (Signed)
-----   Message from Emilie Rutter, PA-C sent at 09/01/2022 12:44 PM EDT -----  Incision check in 3 weeks with PA.  PO R arm ArteGraft Thanks

## 2022-09-01 NOTE — Discharge Instructions (Signed)
° °  [facility]  ° °Discharge Instructions ° °AV Fistula or Graft Surgery for Dialysis Access ° °Please refer to the following instructions for your post-procedure care. Your surgeon or physician assistant will discuss any changes with you. ° °Activity ° °You may drive the day following your surgery, if you are comfortable and no longer taking prescription pain medication. Resume full activity as the soreness in your incision resolves. ° °Bathing/Showering ° °You may shower after you go home. Keep your incision dry for 48 hours. Do not soak in a bathtub, hot tub, or swim until the incision heals completely. You may not shower if you have a hemodialysis catheter. ° °Incision Care ° °Clean your incision with mild soap and water after 48 hours. Pat the area dry with a clean towel. You do not need a bandage unless otherwise instructed. Do not apply any ointments or creams to your incision. You may have skin glue on your incision. Do not peel it off. It will come off on its own in about one week. Your arm may swell a bit after surgery. To reduce swelling use pillows to elevate your arm so it is above your heart. Your doctor will tell you if you need to lightly wrap your arm with an ACE bandage. ° °Diet ° °Resume your normal diet. There are not special food restrictions following this procedure. In order to heal from your surgery, it is CRITICAL to get adequate nutrition. Your body requires vitamins, minerals, and protein. Vegetables are the best source of vitamins and minerals. Vegetables also provide the perfect balance of protein. Processed food has little nutritional value, so try to avoid this. ° °Medications ° °Resume taking all of your medications. If your incision is causing pain, you may take over-the counter pain relievers such as acetaminophen (Tylenol). If you were prescribed a stronger pain medication, please be aware these medications can cause nausea and constipation. Prevent  nausea by taking the medication with a snack or meal. Avoid constipation by drinking plenty of fluids and eating foods with high amount of fiber, such as fruits, vegetables, and grains. Do not take Tylenol if you are taking prescription pain medications. ° ° ° ° °Follow up °Your surgeon may want to see you in the office following your access surgery. If so, this will be arranged at the time of your surgery. ° °Please call us immediately for any of the following conditions: ° °Increased pain, redness, drainage (pus) from your incision site °Fever of 101 degrees or higher °Severe or worsening pain at your incision site °Hand pain or numbness. ° °Reduce your risk of vascular disease: ° °Stop smoking. If you would like help, call [facility] at 1-800-QUIT-NOW (1-800-784-8669) or [facility] at 336-586-4000 ° °Manage your cholesterol °Maintain a desired weight °Control your diabetes °Keep your blood pressure down ° °Dialysis ° °It will take several weeks to several months for your new dialysis access to be ready for use. Your surgeon will determine when it is OK to use it. Your nephrologist will continue to direct your dialysis. You can continue to use your Permcath until your new access is ready for use. ° °If you have any questions, please call the office at [phone]. ° °

## 2022-09-01 NOTE — Op Note (Signed)
    Patient name: GRACESON STECKLEIN MRN: [id number] DOB: January 09, 1946 Sex: female  09/01/2022 Pre-operative Diagnosis: ESRD Post-operative diagnosis:  Same Surgeon:  Apolinar Junes C. Randie Heinz, MD Assistant: Aggie Moats, PA Procedure Performed: Revision of right arm cephalic vein AV fistula with interposition Artegraft  Indications: 77 year old female with end-stage renal disease currently dialyzing via catheter.  She has a right arm cephalic vein fistula which has somewhat matured but has been unable to be cannulated and is quite serpentine in the upper arm.  We have discussed her options being transposition versus graft revision.  Given that the fistula has decreased diameter in the upper arm we have elected for interposition grafting.  An experienced assistant was necessary to facilitate exposure of the existing fistula, tunneled the graft and performed to anastomosis.  Findings: Fistula itself was very large towards the antecubitum was more diminutive in the upper arm but then again near the cephalic arch this was again dilated.  An interposition Artegraft was used for size matching and at completion there was a very strong thrill in the graft and a palpable radial artery pulse at the wrist these were both confirmed with Doppler.   Procedure:  The patient was identified in the holding area and taken to the operating room where she was placed supine on the operating table and MAC anesthesia was induced.  She was sterilely prepped and draped in the right upper extremity in usual fashion, antibiotics were minister timeout was called.  Ultrasound was used to map out the fistula it was quite a bit larger lower in the arm then in the upper arm but then again dilated towards the shoulder area.  We tested for the previous block the block appeared to be intact near the antecubitum towards the shoulder we did use 1% lidocaine with epinephrine to assist with anesthesia.  We made a transverse incisions overlying the fistula  and dissected this out marked for orientation through both incisions.  We tunneled a 19 cm Artegraft between the 2 incisions.  We then clamped the fistula proximally and distally and then transected it and tied it off on both ends.  Both ends were spatulated.  We then sewed both ends and and with 6-0 Prolene suture and prior completion without flushing all directions.  Upon completion there was a very strong thrill through the graft and we confirmed this with Doppler through the cephalic vein traced towards the cephalic arch.  There is a palpable radial artery pulse at the wrist confirmed with Doppler.  We then irrigated both wounds and closed in layers with Vicryl Monocryl.  Dermabond was placed at the skin level.  Patient was awakened from anesthesia having tolerated procedure without any complication.  All counts were correct at completion.   EBL: 50cc   Monte Bronder C. Randie Heinz, MD [facility] [place] Office: [phone] Pager: [phone]

## 2022-09-01 NOTE — Anesthesia Procedure Notes (Signed)
Anesthesia Regional Block: Supraclavicular block   Pre-Anesthetic Checklist: , timeout performed,  Correct Patient, Correct Site, Correct Laterality,  Correct Procedure, Correct Position, site marked,  Risks and benefits discussed,  Pre-op evaluation,  At surgeon's request and post-op pain management  Laterality: Right  Prep: Maximum Sterile Barrier Precautions used, chloraprep       Needles:  Injection technique: Single-shot  Needle Type: Echogenic Stimulator Needle     Needle Length: 9cm  Needle Gauge: 21     Additional Needles:   Procedures:,,,, ultrasound used (permanent image in chart),,    Narrative:  Start time: 09/01/2022 10:02 AM End time: 09/01/2022 10:12 AM Injection made incrementally with aspirations every 5 mL.  Performed by: Personally  Anesthesiologist: Gaynelle Adu, MD

## 2022-09-01 NOTE — Interval H&P Note (Signed)
History and Physical Interval Note:  09/01/2022 9:06 AM  [name]  has presented today for surgery, with the diagnosis of End Stage Renal Disease.  The various methods of treatment have been discussed with the patient and family. After consideration of risks, benefits and other options for treatment, the patient has consented to  Procedure(s): INSERTION OF ARTERIOVENOUS (AV) GORE-TEX GRAFT VERSUS ARTERIOVENOUS FISTULA RIGHT ARM (Right) as a surgical intervention.  The patient's history has been reviewed, patient examined, no change in status, stable for surgery.  I have reviewed the patient's chart and labs.  Questions were answered to the patient's satisfaction.     [name]

## 2022-09-01 NOTE — Anesthesia Procedure Notes (Signed)
Procedure Name: General with mask airway Date/Time: 09/01/2022 11:19 AM  Performed by: Katina Degree, CRNAPre-anesthesia Checklist: Patient identified, Emergency Drugs available, Suction available and Patient being monitored Patient Re-evaluated:Patient Re-evaluated prior to induction Oxygen Delivery Method: Simple face mask Preoxygenation: Pre-oxygenation with 100% oxygen Induction Type: IV induction Dental Injury: Teeth and Oropharynx as per pre-operative assessment

## 2022-09-01 NOTE — Anesthesia Postprocedure Evaluation (Signed)
Anesthesia Post Note  Patient: [name]  Procedure(s) Performed: INSERTION OF ARTERIOVENOUS (AV) GORE-TEX GRAFT RIGHT ARM (Right)     Patient location during evaluation: PACU Anesthesia Type: Regional and MAC Level of consciousness: awake and alert Pain management: pain level controlled Vital Signs Assessment: post-procedure vital signs reviewed and stable Respiratory status: spontaneous breathing, nonlabored ventilation and respiratory function stable Cardiovascular status: stable and blood pressure returned to baseline Postop Assessment: no apparent nausea or vomiting Anesthetic complications: no  No notable events documented.  Last Vitals:  Vitals:   09/01/22 1251 09/01/22 1300  BP: (!) 170/82 (!) 147/78  Pulse: 75 75  Resp: (!) 9 19  Temp:  36.6 C  SpO2: 95% 94%    Last Pain:  Vitals:   09/01/22 1300  PainSc: 0-No pain                 [name],W. EDMOND

## 2022-09-01 NOTE — Transfer of Care (Signed)
Immediate Anesthesia Transfer of Care Note  Patient: [name]  Procedure(s) Performed: INSERTION OF ARTERIOVENOUS (AV) GORE-TEX GRAFT RIGHT ARM (Right)  Patient Location: PACU  Anesthesia Type:MAC combined with regional for post-op pain  Level of Consciousness: awake  Airway & Oxygen Therapy: Patient Spontanous Breathing  Post-op Assessment: Report given to RN and Post -op Vital signs reviewed and stable  Post vital signs: Reviewed and stable  Last Vitals:  Vitals Value Taken Time  BP 148/120 09/01/22 1245  Temp 36.6 C 09/01/22 1243  Pulse 77 09/01/22 1246  Resp 23 09/01/22 1246  SpO2 92 % 09/01/22 1246  Vitals shown include unvalidated device data.  Last Pain:  Vitals:   09/01/22 1243  PainSc: 0-No pain         Complications: No notable events documented.

## 2022-09-02 ENCOUNTER — Other Ambulatory Visit: Payer: Self-pay | Admitting: Nurse Practitioner

## 2022-09-02 ENCOUNTER — Encounter (HOSPITAL_COMMUNITY): Payer: Self-pay | Admitting: Vascular Surgery

## 2022-09-02 DIAGNOSIS — I639 Cerebral infarction, unspecified: Secondary | ICD-10-CM

## 2022-09-06 ENCOUNTER — Ambulatory Visit: Payer: Medicaid Other | Admitting: Vascular Surgery

## 2022-09-06 ENCOUNTER — Encounter (HOSPITAL_COMMUNITY): Payer: Medicaid Other

## 2022-09-11 ENCOUNTER — Encounter: Payer: 59 | Admitting: Nurse Practitioner

## 2022-09-11 ENCOUNTER — Encounter: Payer: Self-pay | Admitting: Nurse Practitioner

## 2022-09-11 NOTE — Telephone Encounter (Signed)
Appt has been scheduled.

## 2022-09-11 NOTE — Progress Notes (Signed)
Err

## 2022-09-27 ENCOUNTER — Ambulatory Visit (INDEPENDENT_AMBULATORY_CARE_PROVIDER_SITE_OTHER): Payer: 59 | Admitting: Physician Assistant

## 2022-09-27 VITALS — BP 158/91 | HR 88 | Temp 97.1°F | Resp 16 | Ht 65.0 in | Wt 170.5 lb

## 2022-09-27 DIAGNOSIS — N186 End stage renal disease: Secondary | ICD-10-CM

## 2022-09-27 DIAGNOSIS — Z992 Dependence on renal dialysis: Secondary | ICD-10-CM

## 2022-09-27 NOTE — Progress Notes (Signed)
  POST OPERATIVE OFFICE NOTE    CC:  F/u for surgery  HPI:  This is a 77 y.o. female who is s/p  Revision of right arm cephalic vein AV fistula with interposition Artegraft  on 09/01/22 by Dr. Randie Heinz.    Pt returns today for follow up.  She denies pain, loss of motor and loss of sensation.  She is on HD TTS via [facility].     Allergies  Allergen Reactions   Lisinopril Other (See Comments)    Abnormal Kidney Function    Pioglitazone Other (See Comments)    REACTION: Desquamation of skin of the palm   Morphine And Codeine Nausea And Vomiting   Sulfonamide Derivatives Rash    Current Outpatient Medications  Medication Sig Dispense Refill   acetaminophen (TYLENOL) 500 MG tablet Take 1 tablet (500 mg total) by mouth every 6 (six) hours as needed. (Patient taking differently: Take 1,000 mg by mouth every 6 (six) hours as needed for moderate pain.) 30 tablet 0   aspirin 81 MG chewable tablet Chew 1 tablet (81 mg total) by mouth daily. 30 tablet 0   B Complex-C-Zn-Folic Acid (DIALYVITE 800 WITH ZINC) 0.8 MG TABS Take 1 tablet by mouth every evening.     calcium acetate (PHOSLO) 667 MG capsule Take 2,001 mg by mouth 3 (three) times daily with meals.     Cinacalcet HCl (SENSIPAR PO) Take 1 capsule by mouth Every Tuesday,Thursday,and Saturday with dialysis.     clopidogrel (PLAVIX) 75 MG tablet TAKE 1 TABLET BY MOUTH EVERY DAY 90 tablet 1   diphenhydrAMINE (BENADRYL) 25 MG tablet Take 25 mg by mouth every 6 (six) hours as needed for allergies.     ezetimibe (ZETIA) 10 MG tablet TAKE 1 TABLET BY MOUTH EVERY DAY 90 tablet 3   HYDROcodone-acetaminophen (NORCO) 5-325 MG tablet Take 1 tablet by mouth every 6 (six) hours as needed for moderate pain. 20 tablet 0   latanoprost (XALATAN) 0.005 % ophthalmic solution Place 1 drop into both eyes at bedtime.     pantoprazole (PROTONIX) 40 MG tablet TAKE 1 TABLET BY MOUTH EVERY DAY 90 tablet 3   PARoxetine (PAXIL) 40 MG tablet TAKE 1 TABLET BY MOUTH EVERY DAY 90  tablet 1   pravastatin (PRAVACHOL) 40 MG tablet TAKE 1 TABLET BY MOUTH EVERYDAY AT BEDTIME 90 tablet 3   sevelamer carbonate (RENVELA) 800 MG tablet Take 2,400 mg by mouth See admin instructions. Take 2400 mg with each meal and 800 mg each snack     traZODone (DESYREL) 100 MG tablet TAKE 1 TABLET BY MOUTH EVERYDAY AT BEDTIME 90 tablet 1   triamcinolone cream (KENALOG) 0.1 % APPLY 1 APPLICATION TOPICALLY 3 (THREE) TIMES DAILY AS NEEDED (RASH). 60 g 1   No current facility-administered medications for this visit.     ROS:  See HPI  Physical Exam:    Incision:  well healed incisions Extremities:  palpable thrill in graft with good visibility of graft over the biceps area.  Grip 5/5 equal B    Assessment/Plan:  This is a 77 y.o. female who is s/p:Right AV Artegraft   The graft may be access as of 10/04/22.  Once it is working well she will be scheduled for [facility] removal.  F/U as needed with [facility].   Mosetta Pigeon PA-C [facility] [phone]   Clinic MD:  Randie Heinz

## 2022-10-16 ENCOUNTER — Ambulatory Visit (INDEPENDENT_AMBULATORY_CARE_PROVIDER_SITE_OTHER): Payer: 59 | Admitting: Nurse Practitioner

## 2022-10-16 ENCOUNTER — Encounter: Payer: Self-pay | Admitting: Nurse Practitioner

## 2022-10-16 VITALS — BP 140/90 | HR 86 | Temp 97.3°F | Ht 65.0 in | Wt 172.0 lb

## 2022-10-16 DIAGNOSIS — E785 Hyperlipidemia, unspecified: Secondary | ICD-10-CM

## 2022-10-16 DIAGNOSIS — N186 End stage renal disease: Secondary | ICD-10-CM | POA: Diagnosis not present

## 2022-10-16 DIAGNOSIS — E1142 Type 2 diabetes mellitus with diabetic polyneuropathy: Secondary | ICD-10-CM

## 2022-10-16 DIAGNOSIS — F39 Unspecified mood [affective] disorder: Secondary | ICD-10-CM

## 2022-10-16 DIAGNOSIS — G4700 Insomnia, unspecified: Secondary | ICD-10-CM

## 2022-10-16 DIAGNOSIS — F17200 Nicotine dependence, unspecified, uncomplicated: Secondary | ICD-10-CM

## 2022-10-16 DIAGNOSIS — K219 Gastro-esophageal reflux disease without esophagitis: Secondary | ICD-10-CM | POA: Diagnosis not present

## 2022-10-16 DIAGNOSIS — Z992 Dependence on renal dialysis: Secondary | ICD-10-CM

## 2022-10-16 MED ORDER — PAROXETINE HCL 20 MG PO TABS
20.0000 mg | ORAL_TABLET | Freq: Every day | ORAL | Status: DC
Start: 2022-10-16 — End: 2023-05-04

## 2022-10-16 NOTE — Patient Instructions (Signed)
Start paxil at half tablet daily- need to take daily for it to work

## 2022-10-16 NOTE — Progress Notes (Signed)
Careteam: Patient Care Team: Sharon Seller, NP as PCP - General (Nurse Practitioner) Kathleene Hazel, MD as PCP - Cardiology (Cardiology) Ernesto Rutherford, MD as Consulting Physician (Ophthalmology) Terrial Rhodes, MD as Consulting Physician (Nephrology) Pola Corn, NP as Nurse Practitioner (Nurse Practitioner) Center, [facility]  PLACE OF SERVICE:  [facility]  Advanced Directive information Does Patient Have a Medical Advance Directive?: No, Does patient want to make changes to medical advance directive?: No - Patient declined (States her bother is aware of her wishes)  Allergies  Allergen Reactions   Lisinopril Other (See Comments)    Abnormal Kidney Function    Pioglitazone Other (See Comments)    REACTION: Desquamation of skin of the palm   Morphine And Codeine Nausea And Vomiting   Sulfonamide Derivatives Rash    Chief Complaint  Patient presents with   Medical Management of Chronic Issues    3 month follow-up. Discuss need for eye exam, covid booster, A1c, and AWV (pending for 10/23/22). Discuss elevated b/p on dialysis days      HPI: Patient is a 77 y.o. female for routine follow up  Reports elevated blood pressure prior to dialysis and better once one.   DM- goes to Dr Dione Booze and Dr Allena Katz for her eye exam.   ESRD- doing continues on HD Tuesday, Thursday and Saturday. Recently had surgery for graft in right arm.   Mood disorder- reports she takes trazodone to help her with sleep. She does not take paxil routinely  GERD- on protonix, occasional GERD  Continues to smoke, does not wish to stop.   Hyperlipidemia- on pravachl and zetia.     Review of Systems:  Review of Systems  Constitutional:  Negative for chills, fever and weight loss.  HENT:  Negative for tinnitus.   Respiratory:  Negative for cough, sputum production and shortness of breath.   Cardiovascular:  Negative for chest pain, palpitations and leg swelling.   Gastrointestinal:  Negative for abdominal pain, constipation, diarrhea and heartburn.  Genitourinary:  Negative for dysuria, frequency and urgency.  Musculoskeletal:  Negative for back pain, falls, joint pain and myalgias.  Skin: Negative.   Neurological:  Negative for dizziness and headaches.  Psychiatric/Behavioral:  Positive for depression. Negative for memory loss. The patient does not have insomnia.     Past Medical History:  Diagnosis Date   Anemia    Anxiety    Arthritis    lower back and knees    Asthma    childhood   Barrett's esophagus    CAD (coronary artery disease)    Chronic kidney disease    Coagulation defect (HCC)    Depression    Diabetes mellitus    Dysphagia    Eczema    End stage renal disease (HCC)    Fluid overload, unspecified    History of herpes zoster 02/2010   Recovered fully after period of acute herpetic neuralgia tx w/ gabapentin.    Hx MRSA infection 2009   Hypercalcemia    Hyperlipidemia    Hypertension    Mitral regurgitation    moderate-severe MR 08/11/22 echo   Moderate protein-calorie malnutrition (HCC)    Neuromuscular disorder (HCC)    chronic pain   Personal history of colonic polyps 10/17/2010   hyperplastic   Stroke [facility])    Past Surgical History:  Procedure Laterality Date   ABDOMINAL HYSTERECTOMY     unclear when   AV FISTULA PLACEMENT Left 02/13/2017   Procedure: ARTERIOVENOUS (AV)  GORE-TEX STRETCH GRAFT INSERTION INTO LEFT ARM;  Surgeon: Sherren Kerns, MD;  Location: [facility] OR;  Service: Vascular;  Laterality: Left;   AV FISTULA PLACEMENT Right 01/16/2022   Procedure: RIGHT ARM ARTERIOVENOUS (AV) FISTULA;  Surgeon: Chuck Hint, MD;  Location: [facility] OR;  Service: Vascular;  Laterality: Right;   AV FISTULA PLACEMENT Right 09/01/2022   Procedure: INSERTION OF ARTERIOVENOUS (AV) GORE-TEX GRAFT RIGHT ARM;  Surgeon: Maeola Harman, MD;  Location: [facility] OR;  Service: Vascular;  Laterality: Right;   CATARACT  EXTRACTION Bilateral    COLONOSCOPY     IR FLUORO GUIDE CV LINE RIGHT  01/09/2022   IR US GUIDE VASC ACCESS RIGHT  01/09/2022   OTHER SURGICAL HISTORY     Catherer placed by [facility], right upper chest area.   thigh surg     left side due to MRSA   UPPER GASTROINTESTINAL ENDOSCOPY     Social History:   reports that she has been smoking cigarettes. She has a 20.00 pack-year smoking history. She has never been exposed to tobacco smoke. She has never used smokeless tobacco. She reports current alcohol use of about 2.0 standard drinks of alcohol per week. She reports that she does not use drugs.  Family History  Problem Relation Age of Onset   Hypertension Father    Kidney disease Father    Diabetes Brother    Colon cancer Neg Hx    CAD Neg Hx    Rectal cancer Neg Hx    Stomach cancer Neg Hx     Medications: Patient's Medications  New Prescriptions   No medications on file  Previous Medications   ACETAMINOPHEN (TYLENOL) 500 MG TABLET    Take 1,000 mg by mouth as needed.   ASPIRIN 81 MG CHEWABLE TABLET    Chew 1 tablet (81 mg total) by mouth daily.   B COMPLEX-C-ZN-FOLIC ACID (DIALYVITE 800 WITH ZINC) 0.8 MG TABS    Take 1 tablet by mouth every evening.   CALCIUM ACETATE (PHOSLO) 667 MG CAPSULE    Take 2,001 mg by mouth 2 (two) times daily.   CINACALCET HCL (SENSIPAR PO)    Take 1 capsule by mouth Every Tuesday,Thursday,and Saturday with dialysis.   CLOPIDOGREL (PLAVIX) 75 MG TABLET    TAKE 1 TABLET BY MOUTH EVERY DAY   DIPHENHYDRAMINE (BENADRYL) 25 MG TABLET    Take 25 mg by mouth every 6 (six) hours as needed for allergies.   EZETIMIBE (ZETIA) 10 MG TABLET    TAKE 1 TABLET BY MOUTH EVERY DAY   HYDROCODONE-ACETAMINOPHEN (NORCO) 5-325 MG TABLET    Take 1 tablet by mouth every 6 (six) hours as needed for moderate pain.   LATANOPROST (XALATAN) 0.005 % OPHTHALMIC SOLUTION    Place 1 drop into both eyes at bedtime.   PANTOPRAZOLE (PROTONIX) 40 MG TABLET    TAKE 1 TABLET BY MOUTH EVERY  DAY   PAROXETINE (PAXIL) 40 MG TABLET    TAKE 1 TABLET BY MOUTH EVERY DAY   PRAVASTATIN (PRAVACHOL) 40 MG TABLET    TAKE 1 TABLET BY MOUTH EVERYDAY AT BEDTIME   SEVELAMER CARBONATE (RENVELA) 800 MG TABLET    Take 2,400 mg by mouth See admin instructions. Take 2400 mg with each meal and 800 mg each snack   TRAZODONE (DESYREL) 100 MG TABLET    TAKE 1 TABLET BY MOUTH EVERYDAY AT BEDTIME   TRIAMCINOLONE CREAM (KENALOG) 0.1 %    APPLY 1 APPLICATION TOPICALLY 3 (THREE) TIMES DAILY  AS NEEDED (RASH).  Modified Medications   No medications on file  Discontinued Medications   ACETAMINOPHEN (TYLENOL) 500 MG TABLET    Take 1 tablet (500 mg total) by mouth every 6 (six) hours as needed.    Physical Exam:  Vitals:   10/16/22 1053 10/16/22 1135  BP: (!) 138/90 (!) 140/90  Pulse: 86   Temp: (!) 97.3 F (36.3 C)   TempSrc: Temporal   SpO2: 98%   Weight: 172 lb (78 kg)   Height: 5\' 5"  (1.651 m)   d Body mass index is 28.62 kg/m. Wt Readings from Last 3 Encounters:  10/16/22 172 lb (78 kg)  09/27/22 170 lb 8 oz (77.3 kg)  09/01/22 170 lb (77.1 kg)    Physical Exam Constitutional:      General: She is not in acute distress.    Appearance: She is well-developed. She is not diaphoretic.  HENT:     Head: Normocephalic and atraumatic.     Mouth/Throat:     Pharynx: No oropharyngeal exudate.  Eyes:     Conjunctiva/sclera: Conjunctivae normal.     Pupils: Pupils are equal, round, and reactive to light.  Cardiovascular:     Rate and Rhythm: Normal rate and regular rhythm.     Heart sounds: Normal heart sounds.  Pulmonary:     Effort: Pulmonary effort is normal.     Breath sounds: Normal breath sounds.  Abdominal:     General: Bowel sounds are normal.     Palpations: Abdomen is soft.  Musculoskeletal:     Cervical back: Normal range of motion and neck supple.     Right lower leg: No edema.     Left lower leg: No edema.  Skin:    General: Skin is warm and dry.  Neurological:     Mental  Status: She is alert.  Psychiatric:        Mood and Affect: Mood normal.     Labs reviewed: Basic Metabolic Panel: Recent Labs    01/08/22 1821 01/16/22 0836 05/24/22 0000 09/01/22 0953  NA 140 139  --  140  K 4.2 4.8 4.3 3.8  CL 98 101  --  99  CO2 28  --   --   --   GLUCOSE 109* 85  --  96  BUN 41* 39*  --  41*  CREATININE 12.94* 10.80*  --  9.70*  CALCIUM 9.2  --  9.5  --   MG 2.6*  --   --   --   PHOS 5.4*  --   --   --    Liver Function Tests: Recent Labs    01/08/22 1821 05/24/22 0000  AST 32  --   ALT 21  --   ALKPHOS 72  --   BILITOT 0.6  --   PROT 5.5*  --   ALBUMIN 3.3* 4.1   No results for input(s): "LIPASE", "AMYLASE" in the last 8760 hours. No results for input(s): "AMMONIA" in the last 8760 hours. CBC: Recent Labs    01/08/22 1821 01/16/22 0836 05/24/22 0000 09/01/22 0953  WBC 8.1  --   --   --   NEUTROABS 4.7  --   --   --   HGB 9.4* 10.2* 10.6* 11.2*  HCT 28.4* 30.0*  --  33.0*  MCV 101.1*  --   --   --   PLT 154  --   --   --    Lipid Panel: Recent Labs  06/12/22 1022  CHOL 143  HDL 50  LDLCALC 70  TRIG 157*  CHOLHDL 2.9   TSH: No results for input(s): "TSH" in the last 8760 hours. A1C: Lab Results  Component Value Date   HGBA1C 5.3 11/10/2021     Assessment/Plan 1. Controlled type 2 diabetes mellitus with diabetic polyneuropathy, without long-term current use of insulin (HCC) Not on any medications -Encouraged dietary compliance, routine foot care/monitoring and to keep up with diabetic eye exams through ophthalmology  - Hemoglobin A1c  2. ESRD (end stage renal disease) on dialysis (HCC) -continues on HD and followed by nephrologist.   3. Gastroesophageal reflux disease, unspecified whether esophagitis present -stable on protonix   4. Mood disorder (HCC) Reports she does not take paxil routinely, will decrease to 20 mg daily and discussed taking daily for therapeutic effect.  - PARoxetine (PAXIL) 20 MG tablet;  Take 1 tablet (20 mg total) by mouth daily.  5. Hyperlipidemia with target LDL less than 70 LDL 70 on last lab, cotinues on pravastatin and zetia.   6. Smoker -encouraged cessation but she is not intrested  7. Insomnia, unspecified type -continues on trazodone.   Return in about 6 months (around 04/17/2023) for routine follow up .  Janene Harvey. [facility] [phone]

## 2022-10-17 LAB — HEMOGLOBIN A1C
Hgb A1c MFr Bld: 5 % of total Hgb (ref <5.7)
Mean Plasma Glucose: 97 mg/dL
eAG (mmol/L): 5.4 mmol/L

## 2022-10-23 ENCOUNTER — Ambulatory Visit (INDEPENDENT_AMBULATORY_CARE_PROVIDER_SITE_OTHER): Payer: 59 | Admitting: Nurse Practitioner

## 2022-10-23 ENCOUNTER — Encounter: Payer: Self-pay | Admitting: Nurse Practitioner

## 2022-10-23 VITALS — BP 114/80 | HR 77 | Temp 97.8°F | Ht 65.0 in | Wt 169.6 lb

## 2022-10-23 DIAGNOSIS — Z0000 Encounter for general adult medical examination without abnormal findings: Secondary | ICD-10-CM | POA: Diagnosis not present

## 2022-10-23 NOTE — Progress Notes (Signed)
Subjective:   [name] is a 77 y.o. female who presents for Medicare Annual (Subsequent) preventive examination.  Visit Complete: In person  Patient Medicare AWV questionnaire was completed by the patient on 10/23/22; I have confirmed that all information answered by patient is correct and no changes since this date.  Review of Systems     Cardiac Risk Factors include: advanced age (>41men, >46 women);sedentary lifestyle;diabetes mellitus;hypertension;dyslipidemia;smoking/ tobacco exposure     Objective:    Today's Vitals   10/23/22 1038  BP: 114/80  Pulse: 77  Temp: 97.8 F (36.6 C)  TempSrc: Temporal  SpO2: 97%  Weight: 169 lb 9.6 oz (76.9 kg)  Height: 5\' 5"  (1.651 m)   Body mass index is 28.22 kg/m.     10/23/2022   10:38 AM 10/16/2022   11:01 AM 09/11/2022    9:11 AM 09/01/2022    9:54 AM 01/16/2022    8:13 AM 01/09/2022    2:29 PM 01/08/2022    5:30 PM  Advanced Directives  Does Patient Have a Medical Advance Directive? No No No No No No No  Does patient want to make changes to medical advance directive? No - Patient declined No - Patient declined       Would patient like information on creating a medical advance directive?   No - Patient declined No - Patient declined No - Patient declined No - Patient declined No - Patient declined    Current Medications (verified) Outpatient Encounter Medications as of 10/23/2022  Medication Sig   acetaminophen (TYLENOL) 500 MG tablet Take 1,000 mg by mouth as needed.   aspirin 81 MG chewable tablet Chew 1 tablet (81 mg total) by mouth daily.   B Complex-C-Zn-Folic Acid (DIALYVITE 800 WITH ZINC) 0.8 MG TABS Take 1 tablet by mouth every evening.   calcium acetate (PHOSLO) 667 MG capsule Take 2,001 mg by mouth 2 (two) times daily.   Cinacalcet HCl (SENSIPAR PO) Take 1 capsule by mouth Every Tuesday,Thursday,and Saturday with dialysis.   clopidogrel (PLAVIX) 75 MG tablet TAKE 1 TABLET BY MOUTH EVERY DAY   diphenhydrAMINE  (BENADRYL) 25 MG tablet Take 25 mg by mouth every 6 (six) hours as needed for allergies.   ezetimibe (ZETIA) 10 MG tablet TAKE 1 TABLET BY MOUTH EVERY DAY   latanoprost (XALATAN) 0.005 % ophthalmic solution Place 1 drop into both eyes at bedtime.   pantoprazole (PROTONIX) 40 MG tablet TAKE 1 TABLET BY MOUTH EVERY DAY   PARoxetine (PAXIL) 20 MG tablet Take 1 tablet (20 mg total) by mouth daily.   pravastatin (PRAVACHOL) 40 MG tablet TAKE 1 TABLET BY MOUTH EVERYDAY AT BEDTIME   sevelamer carbonate (RENVELA) 800 MG tablet Take 2,400 mg by mouth See admin instructions. Take 2400 mg with each meal and 800 mg each snack   traZODone (DESYREL) 100 MG tablet TAKE 1 TABLET BY MOUTH EVERYDAY AT BEDTIME   triamcinolone cream (KENALOG) 0.1 % APPLY 1 APPLICATION TOPICALLY 3 (THREE) TIMES DAILY AS NEEDED (RASH).   No facility-administered encounter medications on file as of 10/23/2022.    Allergies (verified) Lisinopril, Pioglitazone, Morphine and codeine, and Sulfonamide derivatives   History: Past Medical History:  Diagnosis Date   Anemia    Anxiety    Arthritis    lower back and knees    Asthma    childhood   Barrett's esophagus    CAD (coronary artery disease)    Chronic kidney disease    Coagulation defect (HCC)  Depression    Diabetes mellitus    Dysphagia    Eczema    End stage renal disease (HCC)    Fluid overload, unspecified    History of herpes zoster 02/2010   Recovered fully after period of acute herpetic neuralgia tx w/ gabapentin.    Hx MRSA infection 2009   Hypercalcemia    Hyperlipidemia    Hypertension    Mitral regurgitation    moderate-severe MR 08/11/22 echo   Moderate protein-calorie malnutrition (HCC)    Neuromuscular disorder (HCC)    chronic pain   Personal history of colonic polyps 10/17/2010   hyperplastic   Stroke [facility])    Past Surgical History:  Procedure Laterality Date   ABDOMINAL HYSTERECTOMY     unclear when   AV FISTULA PLACEMENT Left 02/13/2017    Procedure: ARTERIOVENOUS (AV) GORE-TEX STRETCH GRAFT INSERTION INTO LEFT ARM;  Surgeon: Sherren Kerns, MD;  Location[facility] OR;  Service: Vascular;  Laterality: Left;   AV FISTULA PLACEMENT Right 01/16/2022   Procedure: RIGHT ARM ARTERIOVENOUS (AV) FISTULA;  Surgeon: Chuck Hint, MD;  Location: [facility] OR;  Service: Vascular;  Laterality: Right;   AV FISTULA PLACEMENT Right 09/01/2022   Procedure: INSERTION OF ARTERIOVENOUS (AV) GORE-TEX GRAFT RIGHT ARM;  Surgeon: Maeola Harman, MD;  Location: [facility] OR;  Service: Vascular;  Laterality: Right;   CATARACT EXTRACTION Bilateral    COLONOSCOPY     IR FLUORO GUIDE CV LINE RIGHT  01/09/2022   IR US GUIDE VASC ACCESS RIGHT  01/09/2022   OTHER SURGICAL HISTORY     Catherer placed by [facility], right upper chest area.   thigh surg     left side due to MRSA   UPPER GASTROINTESTINAL ENDOSCOPY     Family History  Problem Relation Age of Onset   Hypertension Father    Kidney disease Father    Diabetes Brother    Colon cancer Neg Hx    CAD Neg Hx    Rectal cancer Neg Hx    Stomach cancer Neg Hx    Social History   Socioeconomic History   Marital status: Divorced    Spouse name: Not on file   Number of children: 0   Years of education: Not on file   Highest education level: Not on file  Occupational History   Occupation: Retired    Associate Professor: SELF-EMPLOYED  Tobacco Use   Smoking status: Every Day    Packs/day: 0.50    Years: 40.00    Additional pack years: 0.00    Total pack years: 20.00    Types: Cigarettes    Passive exposure: Never   Smokeless tobacco: Never   Tobacco comments:    smokes 1 pack of cigerettes a week- off and on  Vaping Use   Vaping Use: Never used  Substance and Sexual Activity   Alcohol use: Yes    Alcohol/week: 2.0 standard drinks of alcohol    Types: 2 Standard drinks or equivalent per week    Comment: wine and beer- occasionally    Drug use: No   Sexual activity: Never  Other Topics  Concern   Not on file  Social History Narrative   Financial assistance approved for 100% discount at [facility] and has [facility] card per Rudell Cobb   02/10/2010      3 caffeine drinks daily    Social Determinants of Health   Financial Resource Strain: Low Risk  (01/30/2018)   Overall Financial Resource Strain (CARDIA)  Difficulty of Paying Living Expenses: Not hard at all  Food Insecurity: No Food Insecurity (01/30/2018)   Hunger Vital Sign    Worried About Running Out of Food in the Last Year: Never true    Ran Out of Food in the Last Year: Never true  Transportation Needs: No Transportation Needs (01/30/2018)   PRAPARE - Administrator, Civil Service (Medical): No    Lack of Transportation (Non-Medical): No  Physical Activity: Sufficiently Active (01/30/2018)   Exercise Vital Sign    Days of Exercise per Week: 7 days    Minutes of Exercise per Session: 30 min  Stress: Stress Concern Present (01/30/2018)   [employer] of Occupational Health - Occupational Stress Questionnaire    Feeling of Stress : To some extent  Social Connections: Moderately Isolated (01/30/2018)   Social Connection and Isolation Panel [NHANES]    Frequency of Communication with Friends and Family: More than three times a week    Frequency of Social Gatherings with Friends and Family: More than three times a week    Attends Religious Services: Never    Database administrator or Organizations: No    Attends Engineer, structural: Never    Marital Status: Divorced    Tobacco Counseling Ready to quit: Not Answered Counseling given: Not Answered Tobacco comments: smokes 1 pack of cigerettes a week- off and on   Clinical Intake:  Pre-visit preparation completed: Yes  Pain : No/denies pain     BMI - recorded: 28 Nutritional Status: BMI 25 -29 Overweight Diabetes: Yes  How often do you need to have someone help you when you read instructions, pamphlets, or other written materials  from your doctor or pharmacy?: 1 - Never         Activities of Daily Living    10/23/2022   10:52 AM 09/01/2022   10:04 AM  In your present state of health, do you have any difficulty performing the following activities:  Hearing? 0   Vision? 1   Difficulty concentrating or making decisions? 1   Walking or climbing stairs? 0   Dressing or bathing? 0   Doing errands, shopping? 0 0  Preparing Food and eating ? N   Using the Toilet? N   In the past six months, have you accidently leaked urine? Y   Do you have problems with loss of bowel control? N   Managing your Medications? Y   Comment brother helps   Managing your Finances? N   Housekeeping or managing your Housekeeping? N     Patient Care Team: Sharon Seller, NP as PCP - General (Nurse Practitioner) Kathleene Hazel, MD as PCP - Cardiology (Cardiology) Ernesto Rutherford, MD as Consulting Physician (Ophthalmology) Terrial Rhodes, MD as Consulting Physician (Nephrology) Pola Corn, NP as Nurse Practitioner (Nurse Practitioner) Center, [facility]  Indicate any recent Medical Services you may have received from other than [facility] providers in the past year (date may be approximate).     Assessment:   This is a routine wellness examination for [place].  Hearing/Vision screen Hearing Screening - Comments:: No hearing issues per patient  Vision Screening - Comments:: Last eye exam less than 12 months ago with Dr.Groat  Dietary issues and exercise activities discussed:     Goals Addressed   None    Depression Screen    10/23/2022   10:38 AM 06/28/2021    3:20 PM 06/10/2021   10:07 AM 03/12/2020    8:19  AM 08/22/2019    9:01 AM 01/30/2018    8:39 AM 01/08/2017   10:06 AM  PHQ 2/9 Scores  PHQ - 2 Score 0 0 0 0 0 1 2  PHQ- 9 Score       5    Fall Risk    10/23/2022   10:38 AM 10/16/2022   11:00 AM 06/12/2022    8:59 AM 12/09/2021   10:46 AM 08/30/2021    3:05 PM  Fall Risk   Falls in the  past year? 1 1 1 1 1   Number falls in past yr: 1 1 1 1  0  Injury with Fall? 0 0 0 0 0  Risk for fall due to : History of fall(s) Impaired balance/gait History of fall(s) History of fall(s) History of fall(s)  Follow up Falls evaluation completed Falls evaluation completed Falls evaluation completed Falls evaluation completed Falls evaluation completed;Education provided;Falls prevention discussed    MEDICARE RISK AT HOME:   TIMED UP AND GO:  Was the test performed?  No    Cognitive Function:    10/23/2022   10:44 AM 03/07/2019    8:42 AM 01/30/2018    8:52 AM 01/08/2017   10:27 AM 12/13/2016   11:00 AM  MMSE - Mini Mental State Exam  Orientation to time 5 5 5 5 5   Orientation to Place 5 5 5 5 5   Registration 3 3 3 3 3   Attention/ Calculation 3 1 5 5 5   Attention/Calculation-comments mixed up o and r      Recall 0 2 2 1 1   Language- name 2 objects 2 2 2 2 2   Language- repeat 1 1 1 1 1   Language- follow 3 step command 2 3 3 3 3   Language- follow 3 step command-comments took with left hand versus right      Language- read & follow direction 0 1 1 1 1   Language-read & follow direction-comments Did not follow instruction      Write a sentence 1 0 1 1 1   Copy design 0 0 0 0 1  Total score 22 23 28 27 28         06/28/2021    3:22 PM 03/12/2020    8:26 AM  6CIT Screen  What Year? 0 points 0 points  What month? 0 points 0 points  What time? 0 points 0 points  Count back from 20 0 points 0 points  Months in reverse 2 points 4 points  Repeat phrase 6 points 4 points  Total Score 8 points 8 points    Immunizations Immunization History  Administered Date(s) Administered   Hepatitis B, ADULT 06/21/2017, 07/19/2017, 08/18/2017, 09/11/2019, 10/09/2019   Hepb-cpg 02/12/2020   Influenza Split 02/03/2011, 01/12/2012   Influenza Whole 01/07/2010   Influenza, High Dose Seasonal PF 01/08/2017, 01/22/2018, 01/16/2019, 01/22/2020, 01/18/2021   Influenza, Quadrivalent, Recombinant, Inj,  Pf 01/31/2022   Influenza,inj,Quad PF,6+ Mos 01/03/2013, 02/12/2014, 01/15/2015, 01/06/2016   Influenza-Unspecified 01/23/2019   Moderna Sars-Covid-2 Vaccination 06/05/2019, 07/03/2019   PFIZER(Purple Top)SARS-COV-2 Vaccination 01/08/2020, 11/08/2020   Pneumococcal Conjugate-13 04/22/2015   Pneumococcal Polysaccharide-23 01/12/2012   Td 11/23/2008   Tdap 04/07/2020   Zoster Recombinant(Shingrix) 02/13/2018, 12/03/2019   Zoster, Live 04/24/2014    TDAP status: Up to date  Flu Vaccine status: Up to date  Pneumococcal vaccine status: Up to date  Covid-19 vaccine status: Information provided on how to obtain vaccines.   Qualifies for Shingles Vaccine? Yes   Zostavax completed No   Shingrix Completed?:  Yes  Screening Tests Health Maintenance  Topic Date Due   OPHTHALMOLOGY EXAM  02/01/2021   Lung Cancer Screening  04/09/2021   COVID-19 Vaccine (5 - 2023-24 season) 12/23/2021   INFLUENZA VACCINE  11/23/2022   FOOT EXAM  12/10/2022   HEMOGLOBIN A1C  04/17/2023   LIPID PANEL  06/13/2023   Medicare Annual Wellness (AWV)  10/23/2023   DTaP/Tdap/Td (3 - Td or Tdap) 04/07/2030   Pneumonia Vaccine 89+ Years old  Completed   DEXA SCAN  Completed   Hepatitis C Screening  Completed   Zoster Vaccines- Shingrix  Completed   HPV VACCINES  Aged Out   Colonoscopy  Discontinued    Health Maintenance  Health Maintenance Due  Topic Date Due   OPHTHALMOLOGY EXAM  02/01/2021   Lung Cancer Screening  04/09/2021   COVID-19 Vaccine (5 - 2023-24 season) 12/23/2021    Colorectal cancer screening: No longer required.   Mammogram status: No longer required due to age.  Bone Density status: Completed 12/07/21. Results reflect: Bone density results: OSTEOPOROSIS. Repeat every 2 years.  Lung Cancer Screening: (Low Dose CT Chest recommended if Age 64-80 years, 20 pack-year currently smoking OR have quit w/in 15years.) does qualify.   Lung Cancer Screening Referral: done  Additional  Screening:  Hepatitis C Screening: does qualify; Completed   Vision Screening: Recommended annual ophthalmology exams for early detection of glaucoma and other disorders of the eye. Is the patient up to date with their annual eye exam?  No  Who is the provider or what is the name of the office in which the patient attends annual eye exams? [facility] If pt is not established with a provider, would they like to be referred to a provider to establish care? No .   Dental Screening: Recommended annual dental exams for proper oral hygiene  Diabetic Foot Exam: Diabetic Foot Exam: Completed 12/09/21  Community Resource Referral / Chronic Care Management: CRR required this visit?  No   CCM required this visit?  No     Plan:     I have personally reviewed and noted the following in the patient's chart:   Medical and social history Use of alcohol, tobacco or illicit drugs  Current medications and supplements including opioid prescriptions. Patient is not currently taking opioid prescriptions. Functional ability and status Nutritional status Physical activity Advanced directives List of other physicians Hospitalizations, surgeries, and ER visits in previous 12 months Vitals Screenings to include cognitive, depression, and falls Referrals and appointments  In addition, I have reviewed and discussed with patient certain preventive protocols, quality metrics, and best practice recommendations. A written personalized care plan for preventive services as well as general preventive health recommendations were provided to patient.     Sharon Seller, NP   10/23/2022   Place of service: [facility]

## 2022-10-23 NOTE — Patient Instructions (Addendum)
  [name] , Thank you for taking time to come for your Medicare Wellness Visit. I appreciate your ongoing commitment to your health goals. Please review the following plan we discussed and let me know if I can assist you in the future.    To call and make appt with [facility] for your diabetic eye exam Located in: [facility] Address: [address] [place], [place], [place] [address] Phone: [phone]  This is a list of the screening recommended for you and due dates:  Health Maintenance  Topic Date Due   Eye exam for diabetics  02/01/2021   Screening for Lung Cancer  04/09/2021   COVID-19 Vaccine (5 - 2023-24 season) 12/23/2021   Flu Shot  11/23/2022   Complete foot exam   12/10/2022   Hemoglobin A1C  04/17/2023   Lipid (cholesterol) test  06/13/2023   Medicare Annual Wellness Visit  10/23/2023   DTaP/Tdap/Td vaccine (3 - Td or Tdap) 04/07/2030   Pneumonia Vaccine  Completed   DEXA scan (bone density measurement)  Completed   Hepatitis C Screening  Completed   Zoster (Shingles) Vaccine  Completed   HPV Vaccine  Aged Out   Colon Cancer Screening  Discontinued

## 2022-10-30 ENCOUNTER — Other Ambulatory Visit: Payer: Self-pay | Admitting: Nurse Practitioner

## 2022-10-30 DIAGNOSIS — G4700 Insomnia, unspecified: Secondary | ICD-10-CM

## 2022-10-30 NOTE — Telephone Encounter (Signed)
Patient has request refill on medication that has High Rsisk Warning. Medication pend and sent to PCP Janyth Contes Janene Harvey, NP

## 2022-11-18 ENCOUNTER — Other Ambulatory Visit: Payer: Self-pay | Admitting: Nurse Practitioner

## 2022-11-18 DIAGNOSIS — Z8673 Personal history of transient ischemic attack (TIA), and cerebral infarction without residual deficits: Secondary | ICD-10-CM

## 2022-11-18 DIAGNOSIS — I639 Cerebral infarction, unspecified: Secondary | ICD-10-CM

## 2022-12-04 ENCOUNTER — Encounter: Payer: Self-pay | Admitting: *Deleted

## 2022-12-12 ENCOUNTER — Telehealth: Payer: 59

## 2022-12-12 NOTE — Telephone Encounter (Signed)
Patient called and left voicemail on clinical intake phone. She stated her name, DOB, phone number, and states "I need to speak to a person". I called patient back and no answer. Voicemail was left with office call back number.

## 2022-12-19 NOTE — Telephone Encounter (Signed)
I called patient phone and line kept ringing busy.

## 2022-12-29 NOTE — Telephone Encounter (Signed)
Patient has not returned call to office

## 2023-01-03 ENCOUNTER — Other Ambulatory Visit: Payer: Self-pay | Admitting: Nurse Practitioner

## 2023-01-03 DIAGNOSIS — I639 Cerebral infarction, unspecified: Secondary | ICD-10-CM

## 2023-01-12 ENCOUNTER — Telehealth: Payer: Self-pay

## 2023-01-12 NOTE — Telephone Encounter (Signed)
Patient called requesting refill on medication Trazodone. Patient made aware that she has 1 refill and medication should not be out because she takes 1 daily.

## 2023-01-26 ENCOUNTER — Other Ambulatory Visit: Payer: Self-pay

## 2023-01-26 DIAGNOSIS — L309 Dermatitis, unspecified: Secondary | ICD-10-CM

## 2023-01-26 MED ORDER — TRIAMCINOLONE ACETONIDE 0.1 % EX CREA: 1.0000 | TOPICAL_CREAM | Freq: Three times a day (TID) | CUTANEOUS | 1 refills | Status: DC | PRN

## 2023-01-26 NOTE — Telephone Encounter (Signed)
Patient has called and requested that we refill cream.

## 2023-02-05 ENCOUNTER — Encounter: Payer: Self-pay | Admitting: Nurse Practitioner

## 2023-02-05 ENCOUNTER — Ambulatory Visit (INDEPENDENT_AMBULATORY_CARE_PROVIDER_SITE_OTHER): Payer: 59 | Admitting: Nurse Practitioner

## 2023-02-05 VITALS — BP 134/70 | HR 99 | Temp 97.8°F | Ht 65.0 in | Wt 183.0 lb

## 2023-02-05 DIAGNOSIS — Z992 Dependence on renal dialysis: Secondary | ICD-10-CM

## 2023-02-05 DIAGNOSIS — M7989 Other specified soft tissue disorders: Secondary | ICD-10-CM | POA: Diagnosis not present

## 2023-02-05 DIAGNOSIS — N186 End stage renal disease: Secondary | ICD-10-CM

## 2023-02-05 DIAGNOSIS — R233 Spontaneous ecchymoses: Secondary | ICD-10-CM

## 2023-02-05 DIAGNOSIS — Z23 Encounter for immunization: Secondary | ICD-10-CM | POA: Diagnosis not present

## 2023-02-05 NOTE — Patient Instructions (Addendum)
-  encouraged to elevate legs above level of heart as tolerates, low sodium diet, compression hose as tolerates (on in am, off in pm)  Let us know if leg swelling becomes worse or painful, red or hot.   To have them check your platelets with next lab draw (tomorrow)

## 2023-02-05 NOTE — Progress Notes (Signed)
Careteam: Patient Care Team: Sharon Seller, NP as PCP - General (Nurse Practitioner) Kathleene Hazel, MD as PCP - Cardiology (Cardiology) Ernesto Rutherford, MD as Consulting Physician (Ophthalmology) Terrial Rhodes, MD as Consulting Physician (Nephrology) Pola Corn, NP as Nurse Practitioner (Nurse Practitioner) Center, [place]  PLACE OF SERVICE:  [facility]  Advanced Directive information    Allergies  Allergen Reactions   Lisinopril Other (See Comments)    Abnormal Kidney Function    Pioglitazone Other (See Comments)    REACTION: Desquamation of skin of the palm   Morphine And Codeine Nausea And Vomiting   Sulfonamide Derivatives Rash    Chief Complaint  Patient presents with   Acute Visit    Patient c/o bilateral foot swelling and bruising x 1.5 weeks. Moderate fall risk      HPI: Patient is a 77 y.o. female presents for an acute visit.  Patient complaining of bilateral foot/leg swelling and bruising for 1.5 weeks. States the bruising happens when she hits herself on things. She is on plavix and aspirin.  Also has dry skin, uses lotion to help but reports she does have some itching.    Swelling is in both legs, denies pain/warmth, denies periods of prolonged sitting. Does add some salt to her foods, denies eating canned foods. Has not missed dialysis but states she will miss Saturday dialysis due to [employer] homecoming traffic, she will not be able to get there.   Review of Systems:  Review of Systems  Respiratory:  Negative for cough and shortness of breath.   Cardiovascular:  Positive for palpitations and leg swelling. Negative for chest pain and orthopnea.  Endo/Heme/Allergies:  Bruises/bleeds easily.    Past Medical History:  Diagnosis Date   Anemia    Anxiety    Arthritis    lower back and knees    Asthma    childhood   Barrett's esophagus    CAD (coronary artery disease)    Chronic kidney disease    Coagulation  defect (HCC)    Depression    Diabetes mellitus    Dysphagia    Eczema    End stage renal disease (HCC)    Fluid overload, unspecified    History of herpes zoster 02/2010   Recovered fully after period of acute herpetic neuralgia tx w/ gabapentin.    Hx MRSA infection 2009   Hypercalcemia    Hyperlipidemia    Hypertension    Mitral regurgitation    moderate-severe MR 08/11/22 echo   Moderate protein-calorie malnutrition (HCC)    Neuromuscular disorder (HCC)    chronic pain   Personal history of colonic polyps 10/17/2010   hyperplastic   Stroke [facility])    Past Surgical History:  Procedure Laterality Date   ABDOMINAL HYSTERECTOMY     unclear when   AV FISTULA PLACEMENT Left 02/13/2017   Procedure: ARTERIOVENOUS (AV) GORE-TEX STRETCH GRAFT INSERTION INTO LEFT ARM;  Surgeon: Sherren Kerns, MD;  Location: [facility] OR;  Service: Vascular;  Laterality: Left;   AV FISTULA PLACEMENT Right 01/16/2022   Procedure: RIGHT ARM ARTERIOVENOUS (AV) FISTULA;  Surgeon: Chuck Hint, MD;  Location: [facility] OR;  Service: Vascular;  Laterality: Right;   AV FISTULA PLACEMENT Right 09/01/2022   Procedure: INSERTION OF ARTERIOVENOUS (AV) GORE-TEX GRAFT RIGHT ARM;  Surgeon: Maeola Harman, MD;  Location: [facility] OR;  Service: Vascular;  Laterality: Right;   CATARACT EXTRACTION Bilateral    COLONOSCOPY     IR FLUORO GUIDE  CV LINE RIGHT  01/09/2022   IR US GUIDE VASC ACCESS RIGHT  01/09/2022   OTHER SURGICAL HISTORY     Catherer placed by [facility], right upper chest area.   thigh surg     left side due to MRSA   UPPER GASTROINTESTINAL ENDOSCOPY     Social History:   reports that she has been smoking cigarettes. She has a 20 pack-year smoking history. She has never been exposed to tobacco smoke. She has never used smokeless tobacco. She reports current alcohol use of about 2.0 standard drinks of alcohol per week. She reports that she does not use drugs.  Family History  Problem Relation Age of  Onset   Hypertension Father    Kidney disease Father    Diabetes Brother    Colon cancer Neg Hx    CAD Neg Hx    Rectal cancer Neg Hx    Stomach cancer Neg Hx     Medications: Patient's Medications  New Prescriptions   No medications on file  Previous Medications   ACETAMINOPHEN (TYLENOL) 500 MG TABLET    Take 1,000 mg by mouth as needed.   ASPIRIN 81 MG CHEWABLE TABLET    Chew 1 tablet (81 mg total) by mouth daily.   B COMPLEX-C-ZN-FOLIC ACID (DIALYVITE 800 WITH ZINC) 0.8 MG TABS    Take 1 tablet by mouth every evening.   CALCIUM ACETATE (PHOSLO) 667 MG CAPSULE    Take 2,001 mg by mouth 2 (two) times daily.   CINACALCET HCL (SENSIPAR PO)    Take 1 capsule by mouth Every Tuesday,Thursday,and Saturday with dialysis.   CLOPIDOGREL (PLAVIX) 75 MG TABLET    TAKE 1 TABLET BY MOUTH EVERY DAY   DIPHENHYDRAMINE (BENADRYL) 25 MG TABLET    Take 25 mg by mouth every 6 (six) hours as needed for allergies.   EZETIMIBE (ZETIA) 10 MG TABLET    TAKE 1 TABLET BY MOUTH EVERY DAY   LATANOPROST (XALATAN) 0.005 % OPHTHALMIC SOLUTION    Place 1 drop into both eyes at bedtime.   PANTOPRAZOLE (PROTONIX) 40 MG TABLET    TAKE 1 TABLET BY MOUTH EVERY DAY   PAROXETINE (PAXIL) 20 MG TABLET    Take 1 tablet (20 mg total) by mouth daily.   PRAVASTATIN (PRAVACHOL) 40 MG TABLET    TAKE 1 TABLET BY MOUTH EVERYDAY AT BEDTIME   SEVELAMER CARBONATE (RENVELA) 800 MG TABLET    Take 2,400 mg by mouth See admin instructions. Take 2400 mg with each meal and 800 mg each snack   TRAZODONE (DESYREL) 100 MG TABLET    TAKE 1 TABLET BY MOUTH EVERYDAY AT BEDTIME   TRIAMCINOLONE CREAM (KENALOG) 0.1 %    Apply 1 Application topically 3 (three) times daily as needed (rash).  Modified Medications   No medications on file  Discontinued Medications   No medications on file    Physical Exam:  Vitals:   02/05/23 1428  BP: 134/70  Pulse: 99  Temp: 97.8 F (36.6 C)  TempSrc: Temporal  SpO2: 97%  Weight: 183 lb (83 kg)  Height:  5\' 5"  (1.651 m)   Body mass index is 30.45 kg/m. Wt Readings from Last 3 Encounters:  02/05/23 183 lb (83 kg)  10/23/22 169 lb 9.6 oz (76.9 kg)  10/16/22 172 lb (78 kg)    Physical Exam Constitutional:      General: She is not in acute distress.    Appearance: Normal appearance.  Cardiovascular:  Rate and Rhythm: Normal rate and regular rhythm.     Pulses: Normal pulses.     Heart sounds: Normal heart sounds.  Pulmonary:     Effort: Pulmonary effort is normal.     Breath sounds: Normal breath sounds.  Abdominal:     General: Bowel sounds are normal.     Palpations: Abdomen is soft.  Musculoskeletal:        General: No tenderness.     Right lower leg: Edema present.     Left lower leg: Edema (left greater than right) present.  Skin:    Findings: Bruising (bilateral upper/lower extremities) present.  Neurological:     General: No focal deficit present.     Mental Status: She is alert and oriented to person, place, and time. Mental status is at baseline.  Psychiatric:        Mood and Affect: Mood normal.        Behavior: Behavior normal.     Labs reviewed: Basic Metabolic Panel: Recent Labs    05/24/22 0000 09/01/22 0953  NA  --  140  K 4.3 3.8  CL  --  99  GLUCOSE  --  96  BUN  --  41*  CREATININE  --  9.70*  CALCIUM 9.5  --    Liver Function Tests: Recent Labs    05/24/22 0000  ALBUMIN 4.1   No results for input(s): "LIPASE", "AMYLASE" in the last 8760 hours. No results for input(s): "AMMONIA" in the last 8760 hours. CBC: Recent Labs    05/24/22 0000 09/01/22 0953  HGB 10.6* 11.2*  HCT  --  33.0*   Lipid Panel: Recent Labs    06/12/22 1022  CHOL 143  HDL 50  LDLCALC 70  TRIG 157*  CHOLHDL 2.9   TSH: No results for input(s): "TSH" in the last 8760 hours. A1C: Lab Results  Component Value Date   HGBA1C 5.0 10/16/2022     Assessment/Plan 1. ESRD (end stage renal disease) on dialysis [facility]) -Continue dialysis and care with  nephrology  2. Bruises easily -On Plavix and aspirin, avoid bumping into things, fall precautions -platelet draw at next dialysis  -no significant or abnormal bruising noted today.   3. Leg swelling Due to venous insufficiency with CKD.  Better in am and worse throughout the day.   -Avoid adding salt on food -Notify office if warmth, pain, redness develops in legs or swelling worsens -Elevate legs above level of heart as tolerated -Wear compression socks as tolerated; on in the morning, off at night  4. Need for influenza vaccination - Flu Vaccine Trivalent High Dose (Fluad)   Rollen Sox, [place] Student I personally was present during the history, physical exam and medical decision-making activities of this service and have verified that the service and findings are accurately documented in the student's note Abbey Chatters, NP

## 2023-02-20 ENCOUNTER — Other Ambulatory Visit (HOSPITAL_COMMUNITY): Payer: Self-pay | Admitting: *Deleted

## 2023-02-20 DIAGNOSIS — N185 Chronic kidney disease, stage 5: Secondary | ICD-10-CM

## 2023-02-21 ENCOUNTER — Ambulatory Visit (HOSPITAL_COMMUNITY)
Admission: RE | Admit: 2023-02-21 | Discharge: 2023-02-21 | Disposition: A | Discharge status: Home / Self Care | Payer: 59 | Source: Ambulatory Visit | Attending: Nephrology | Admitting: Nephrology

## 2023-02-21 DIAGNOSIS — N185 Chronic kidney disease, stage 5: Secondary | ICD-10-CM | POA: Insufficient documentation

## 2023-02-21 LAB — PREPARE RBC (CROSSMATCH)

## 2023-02-21 MED ORDER — SODIUM CHLORIDE 0.9% IV SOLUTION: Freq: Once | INTRAVENOUS | Status: DC

## 2023-02-22 LAB — TYPE AND SCREEN
ABO/RH(D): B POS
Antibody Screen: NEGATIVE
Unit division: 0

## 2023-02-22 LAB — BPAM RBC
Blood Product Expiration Date: 202411192359
ISSUE DATE / TIME: 202410301018
Unit Type and Rh: 7300

## 2023-02-23 LAB — HEMOGLOBIN A1C: Hemoglobin A1C: 4.7

## 2023-03-13 ENCOUNTER — Other Ambulatory Visit: Payer: Self-pay | Admitting: Gastroenterology

## 2023-03-13 ENCOUNTER — Other Ambulatory Visit: Payer: Self-pay | Admitting: Nurse Practitioner

## 2023-03-13 DIAGNOSIS — G4700 Insomnia, unspecified: Secondary | ICD-10-CM

## 2023-03-13 NOTE — Telephone Encounter (Signed)
Patient medication Trazodone has High Risk Warnings. Medication pend and sent to PCP Janyth Contes Janene Harvey, NP for approval.

## 2023-03-17 ENCOUNTER — Other Ambulatory Visit: Payer: Self-pay | Admitting: Nurse Practitioner

## 2023-03-28 ENCOUNTER — Other Ambulatory Visit: Payer: Self-pay | Admitting: Student

## 2023-03-28 DIAGNOSIS — L309 Dermatitis, unspecified: Secondary | ICD-10-CM

## 2023-04-13 ENCOUNTER — Ambulatory Visit: Payer: 59 | Admitting: Nurse Practitioner

## 2023-04-23 ENCOUNTER — Encounter: Payer: 59 | Admitting: Nurse Practitioner

## 2023-04-23 DIAGNOSIS — Z66 Do not resuscitate: Secondary | ICD-10-CM

## 2023-04-23 NOTE — Progress Notes (Signed)
This encounter was created in error - please disregard.

## 2023-04-26 ENCOUNTER — Other Ambulatory Visit: Payer: Self-pay | Admitting: Gastroenterology

## 2023-04-26 ENCOUNTER — Encounter (HOSPITAL_COMMUNITY): Payer: Self-pay | Admitting: *Deleted

## 2023-04-26 NOTE — Telephone Encounter (Signed)
 Must keep appointment with Dr Thyra Breed before any further refills

## 2023-04-27 ENCOUNTER — Other Ambulatory Visit: Payer: Self-pay | Admitting: Nurse Practitioner

## 2023-04-27 DIAGNOSIS — L309 Dermatitis, unspecified: Secondary | ICD-10-CM

## 2023-04-27 NOTE — Telephone Encounter (Signed)
 Pharmacy requested refill.  Pended Rx and sent to [facility] for approval.

## 2023-05-04 ENCOUNTER — Encounter: Payer: Self-pay | Admitting: Nurse Practitioner

## 2023-05-04 ENCOUNTER — Ambulatory Visit (INDEPENDENT_AMBULATORY_CARE_PROVIDER_SITE_OTHER): Payer: 59 | Admitting: Nurse Practitioner

## 2023-05-04 VITALS — BP 148/100 | HR 88 | Temp 97.1°F | Ht 65.0 in | Wt 173.2 lb

## 2023-05-04 DIAGNOSIS — N186 End stage renal disease: Secondary | ICD-10-CM

## 2023-05-04 DIAGNOSIS — F17200 Nicotine dependence, unspecified, uncomplicated: Secondary | ICD-10-CM

## 2023-05-04 DIAGNOSIS — E1142 Type 2 diabetes mellitus with diabetic polyneuropathy: Secondary | ICD-10-CM | POA: Diagnosis not present

## 2023-05-04 DIAGNOSIS — E785 Hyperlipidemia, unspecified: Secondary | ICD-10-CM

## 2023-05-04 DIAGNOSIS — F39 Unspecified mood [affective] disorder: Secondary | ICD-10-CM | POA: Diagnosis not present

## 2023-05-04 DIAGNOSIS — G4700 Insomnia, unspecified: Secondary | ICD-10-CM | POA: Diagnosis not present

## 2023-05-04 DIAGNOSIS — R21 Rash and other nonspecific skin eruption: Secondary | ICD-10-CM

## 2023-05-04 DIAGNOSIS — Z992 Dependence on renal dialysis: Secondary | ICD-10-CM

## 2023-05-04 MED ORDER — TRAZODONE HCL 100 MG PO TABS: 100.0000 mg | ORAL_TABLET | Freq: Every day | ORAL | 1 refills | Status: DC

## 2023-05-04 MED ORDER — SERTRALINE HCL 50 MG PO TABS: 50.0000 mg | ORAL_TABLET | Freq: Every day | ORAL | 1 refills | Status: DC

## 2023-05-04 MED ORDER — PREDNISONE 10 MG (21) PO TBPK: ORAL_TABLET | ORAL | 0 refills | Status: DC

## 2023-05-04 NOTE — Patient Instructions (Addendum)
 STOP paxil- please let pharmacy know we are stopping this medication  Start zoloft 50 mg daily for mood  Prednisone taper has been sent to pharmacy to help with the rash  Also recommend making a dermatology appt for further evaluation

## 2023-05-04 NOTE — Progress Notes (Signed)
 Careteam: Patient Care Team: [name], NP as PCP - General (Nurse Practitioner) Verlin Lonni BIRCH, MD as PCP - Cardiology (Cardiology) Octavia Charleston, MD as Consulting Physician (Ophthalmology) Rayburn Pac, MD as Consulting Physician (Nephrology) Delores Ricka Maidens, NP as Nurse Practitioner (Nurse Practitioner) Center, [facility]  PLACE OF SERVICE:  [facility]  Advanced Directive information Does Patient Have a Medical Advance Directive?: Yes, Type of Advance Directive: Out of facility DNR (pink MOST or yellow form), Pre-existing out of facility DNR order (yellow form or pink MOST form): Yellow form placed in chart (order not valid for inpatient use), Does patient want to make changes to medical advance directive?: No - Patient declined  Allergies  Allergen Reactions   Lisinopril  Other (See Comments)    Abnormal Kidney Function    Pioglitazone Other (See Comments)    REACTION: Desquamation of skin of the palm   Morphine And Codeine Nausea And Vomiting   Sulfonamide Derivatives Rash    Chief Complaint  Patient presents with   Medical Management of Chronic Issues    6 month follow-up. Discuss need for eye exam , lung cancer screening covid booster and A1c. Discuss DNR status. Refill Trazodone  and Kenalog  cream. Discuss new rx request for valium. Examine skin discoloration.      HPI: Patient is a 78 y.o. female for routine follow up.   Has HD 3 days a week  She continues to smoke  She has dark discoloration to bilateral arms, uses triamcinolone , helps but does not get rid of it. Applying a lot of the kenalog  cream due to the itching.   Goes to the podiatrist.   GERD stable on protonix .   Anxiety- reports mood is alright sometimes, gets mad quick with her brother.   Appears she is taking paxil  40 mg daily (also has 20 mg on her list) She is also taking trazodone  100 mg daily at bedtime for sleep which has been effective.  She reports  she has mood swings   Review of Systems:  Review of Systems  Constitutional:  Negative for chills, fever and weight loss.  HENT:  Negative for tinnitus.   Respiratory:  Negative for cough, sputum production and shortness of breath.   Cardiovascular:  Negative for chest pain, palpitations and leg swelling.  Gastrointestinal:  Negative for abdominal pain, constipation, diarrhea and heartburn.  Genitourinary:  Negative for dysuria, frequency and urgency.  Musculoskeletal:  Negative for back pain, falls, joint pain and myalgias.  Skin:  Positive for itching and rash.  Neurological:  Negative for dizziness and headaches.  Psychiatric/Behavioral:  Negative for depression and memory loss. The patient is nervous/anxious. The patient does not have insomnia.     Past Medical History:  Diagnosis Date   Anemia    Anxiety    Arthritis    lower back and knees    Asthma    childhood   Barrett's esophagus    CAD (coronary artery disease)    Chronic kidney disease    Coagulation defect (HCC)    Depression    Diabetes mellitus    Dysphagia    Eczema    End stage renal disease (HCC)    Fluid overload, unspecified    History of herpes zoster 02/2010   Recovered fully after period of acute herpetic neuralgia tx w/ gabapentin .    Hx MRSA infection 2009   Hypercalcemia    Hyperlipidemia    Hypertension    Mitral regurgitation    moderate-severe MR  08/11/22 echo   Moderate protein-calorie malnutrition (HCC)    Neuromuscular disorder (HCC)    chronic pain   Personal history of colonic polyps 10/17/2010   hyperplastic   Stroke [facility])    Past Surgical History:  Procedure Laterality Date   ABDOMINAL HYSTERECTOMY     unclear when   AV FISTULA PLACEMENT Left 02/13/2017   Procedure: ARTERIOVENOUS (AV) GORE-TEX STRETCH GRAFT INSERTION INTO LEFT ARM;  Surgeon: Harvey Carlin BRAVO, MD;  [facility] OR;  Service: Vascular;  Laterality: Left;   AV FISTULA PLACEMENT Right 01/16/2022   Procedure: RIGHT  ARM ARTERIOVENOUS (AV) FISTULA;  Surgeon: [name], MD;  Location: [facility] OR;  Service: Vascular;  Laterality: Right;   AV FISTULA PLACEMENT Right 09/01/2022   Procedure: INSERTION OF ARTERIOVENOUS (AV) GORE-TEX GRAFT RIGHT ARM;  Surgeon: Sheree Penne Lonni, MD;  Location: [facility] OR;  Service: Vascular;  Laterality: Right;   CATARACT EXTRACTION Bilateral    COLONOSCOPY     IR FLUORO GUIDE CV LINE RIGHT  01/09/2022   IR US  GUIDE VASC ACCESS RIGHT  01/09/2022   OTHER SURGICAL HISTORY     Catherer placed by [facility], right upper chest area.   thigh surg     left side due to MRSA   UPPER GASTROINTESTINAL ENDOSCOPY     Social History:   reports that she has been smoking cigarettes. She has a 20 pack-year smoking history. She has never been exposed to tobacco smoke. She has never used smokeless tobacco. She reports current alcohol use of about 2.0 standard drinks of alcohol per week. She reports that she does not use drugs.  Family History  Problem Relation Age of Onset   Hypertension Father    Kidney disease Father    Diabetes Brother    Colon cancer Neg Hx    CAD Neg Hx    Rectal cancer Neg Hx    Stomach cancer Neg Hx     Medications: Patient's Medications  New Prescriptions   No medications on file  Previous Medications   ACETAMINOPHEN  (TYLENOL ) 500 MG TABLET    Take 1,000 mg by mouth as needed.   ASPIRIN  81 MG CHEWABLE TABLET    Chew 1 tablet (81 mg total) by mouth daily.   B COMPLEX-C-ZN-FOLIC ACID  (DIALYVITE 800 WITH ZINC ) 0.8 MG TABS    Take 1 tablet by mouth every evening.   CALCIUM  ACETATE (PHOSLO) 667 MG CAPSULE    Take 2,001 mg by mouth 2 (two) times daily.   CINACALCET  HCL (SENSIPAR  PO)    Take 1 capsule by mouth Every Tuesday,Thursday,and Saturday with dialysis.   CLOPIDOGREL  (PLAVIX ) 75 MG TABLET    TAKE 1 TABLET BY MOUTH EVERY DAY   DIPHENHYDRAMINE  (BENADRYL ) 25 MG TABLET    Take 25 mg by mouth every 6 (six) hours as needed for allergies.   EZETIMIBE   (ZETIA ) 10 MG TABLET    TAKE 1 TABLET BY MOUTH EVERY DAY   LATANOPROST  (XALATAN ) 0.005 % OPHTHALMIC SOLUTION    Place 1 drop into both eyes at bedtime.   PANTOPRAZOLE  (PROTONIX ) 40 MG TABLET    TAKE 1 TABLET BY MOUTH EVERY DAY   PAROXETINE  (PAXIL ) 20 MG TABLET    Take 1 tablet (20 mg total) by mouth daily.   PAROXETINE  (PAXIL ) 40 MG TABLET    TAKE 1 TABLET BY MOUTH EVERY DAY   PRAVASTATIN  (PRAVACHOL ) 40 MG TABLET    TAKE 1 TABLET BY MOUTH EVERYDAY AT BEDTIME   SEVELAMER CARBONATE (RENVELA)  800 MG TABLET    Take 2,400 mg by mouth See admin instructions. Take 2400 mg with each meal and 800 mg each snack   TRAZODONE  (DESYREL ) 100 MG TABLET    TAKE 1 TABLET BY MOUTH EVERYDAY AT BEDTIME   TRIAMCINOLONE  CREAM (KENALOG ) 0.1 %    APPLY 1 APPLICATION TOPICALLY 3 (THREE) TIMES DAILY AS NEEDED (RASH).  Modified Medications   No medications on file  Discontinued Medications   No medications on file    Physical Exam:  Vitals:   05/04/23 0838  BP: (!) 148/100  Pulse: 88  Temp: (!) 97.1 F (36.2 C)  TempSrc: Temporal  SpO2: 95%  Weight: 173 lb 3.2 oz (78.6 kg)  Height: 5' 5 (1.651 m)   Body mass index is 28.82 kg/m. Wt Readings from Last 3 Encounters:  05/04/23 173 lb 3.2 oz (78.6 kg)  02/05/23 183 lb (83 kg)  10/23/22 169 lb 9.6 oz (76.9 kg)    Physical Exam Constitutional:      General: She is not in acute distress.    Appearance: She is well-developed. She is not diaphoretic.  HENT:     Head: Normocephalic and atraumatic.     Mouth/Throat:     Pharynx: No oropharyngeal exudate.  Eyes:     Conjunctiva/sclera: Conjunctivae normal.     Pupils: Pupils are equal, round, and reactive to light.  Cardiovascular:     Rate and Rhythm: Normal rate and regular rhythm.     Heart sounds: Normal heart sounds.  Pulmonary:     Effort: Pulmonary effort is normal.     Breath sounds: Normal breath sounds.  Abdominal:     General: Bowel sounds are normal.     Palpations: Abdomen is soft.   Musculoskeletal:     Cervical back: Normal range of motion and neck supple.     Right lower leg: No edema.     Left lower leg: No edema.  Skin:    General: Skin is warm and dry.     Findings: Rash (to bilateral arms and legs) present.  Neurological:     Mental Status: She is alert.  Psychiatric:        Mood and Affect: Mood normal.     Labs reviewed: Basic Metabolic Panel: Recent Labs    05/24/22 0000 09/01/22 0953  NA  --  140  K 4.3 3.8  CL  --  99  GLUCOSE  --  96  BUN  --  41*  CREATININE  --  9.70*  CALCIUM  9.5  --    Liver Function Tests: Recent Labs    05/24/22 0000  ALBUMIN  4.1   No results for input(s): LIPASE, AMYLASE in the last 8760 hours. No results for input(s): AMMONIA in the last 8760 hours. CBC: Recent Labs    05/24/22 0000 09/01/22 0953  HGB 10.6* 11.2*  HCT  --  33.0*   Lipid Panel: Recent Labs    06/12/22 1022  CHOL 143  HDL 50  LDLCALC 70  TRIG 157*  CHOLHDL 2.9   TSH: No results for input(s): TSH in the last 8760 hours. A1C: Lab Results  Component Value Date   HGBA1C 5.0 10/16/2022     Assessment/Plan 1. Insomnia, unspecified type Stable on trazodone  - traZODone  (DESYREL ) 100 MG tablet; Take 1 tablet (100 mg total) by mouth at bedtime.  Dispense: 90 tablet; Refill: 1  2. Smoker Encouraged cessation  - Ambulatory Referral for Lung Cancer Scre  3. Mood disorder (HCC) -  stop paxil   Start zolof to help with mood   4. Rash and nonspecific skin eruption Ongoing to bilateral arms and legs.  - predniSONE  (STERAPRED UNI-PAK 21 TAB) 10 MG (21) TBPK tablet; Use as directed  Dispense: 21 tablet; Refill: 0 - Ambulatory referral to Dermatology for further evaluation and treatment   5. ESRD (end stage renal disease) on dialysis (HCC) -continue on HD  6. Controlled type 2 diabetes mellitus with diabetic polyneuropathy, without long-term current use of insulin  (HCC) (Primary) -Encouraged dietary compliance, routine  foot care/monitoring and to keep up with diabetic eye exams through ophthalmology   7. Hyperlipidemia with target LDL less than 70 -LDL at goal on last lab, continues on zetia .    Return in about 4 weeks (around 06/01/2023) for mood and rash follow up .:   Donovan Gatchel K. Caro BODILY [facility] [phone]

## 2023-05-09 ENCOUNTER — Encounter (HOSPITAL_COMMUNITY): Payer: Self-pay | Admitting: *Deleted

## 2023-05-11 ENCOUNTER — Encounter (HOSPITAL_COMMUNITY)
Admission: RE | Disposition: A | Discharge status: Home / Self Care | Payer: Self-pay | Source: Home / Self Care | Attending: Nephrology

## 2023-05-11 ENCOUNTER — Other Ambulatory Visit: Payer: Self-pay

## 2023-05-11 ENCOUNTER — Ambulatory Visit (HOSPITAL_COMMUNITY)
Admission: RE | Admit: 2023-05-11 | Discharge: 2023-05-11 | Disposition: A | Discharge status: Home / Self Care | Payer: 59 | Attending: Nephrology | Admitting: Nephrology

## 2023-05-11 DIAGNOSIS — Z992 Dependence on renal dialysis: Secondary | ICD-10-CM | POA: Insufficient documentation

## 2023-05-11 DIAGNOSIS — E1122 Type 2 diabetes mellitus with diabetic chronic kidney disease: Secondary | ICD-10-CM | POA: Insufficient documentation

## 2023-05-11 DIAGNOSIS — I120 Hypertensive chronic kidney disease with stage 5 chronic kidney disease or end stage renal disease: Secondary | ICD-10-CM | POA: Diagnosis not present

## 2023-05-11 DIAGNOSIS — Z833 Family history of diabetes mellitus: Secondary | ICD-10-CM | POA: Insufficient documentation

## 2023-05-11 DIAGNOSIS — N186 End stage renal disease: Secondary | ICD-10-CM | POA: Diagnosis not present

## 2023-05-11 DIAGNOSIS — D631 Anemia in chronic kidney disease: Secondary | ICD-10-CM | POA: Diagnosis not present

## 2023-05-11 DIAGNOSIS — Z841 Family history of disorders of kidney and ureter: Secondary | ICD-10-CM | POA: Insufficient documentation

## 2023-05-11 DIAGNOSIS — F17210 Nicotine dependence, cigarettes, uncomplicated: Secondary | ICD-10-CM | POA: Insufficient documentation

## 2023-05-11 DIAGNOSIS — Y832 Surgical operation with anastomosis, bypass or graft as the cause of abnormal reaction of the patient, or of later complication, without mention of misadventure at the time of the procedure: Secondary | ICD-10-CM | POA: Insufficient documentation

## 2023-05-11 DIAGNOSIS — Z8249 Family history of ischemic heart disease and other diseases of the circulatory system: Secondary | ICD-10-CM | POA: Diagnosis not present

## 2023-05-11 DIAGNOSIS — T82858A Stenosis of vascular prosthetic devices, implants and grafts, initial encounter: Secondary | ICD-10-CM | POA: Insufficient documentation

## 2023-05-11 HISTORY — PX: A/V FISTULAGRAM: CATH118298

## 2023-05-11 HISTORY — PX: PERIPHERAL VASCULAR BALLOON ANGIOPLASTY: CATH118281

## 2023-05-11 SURGERY — A/V FISTULAGRAM
Anesthesia: LOCAL

## 2023-05-11 MED ORDER — MIDAZOLAM HCL 2 MG/2ML IJ SOLN
INTRAMUSCULAR | Status: DC | PRN
  Administered 2023-05-11: 1 mg via INTRAVENOUS

## 2023-05-11 MED ORDER — MIDAZOLAM HCL 2 MG/2ML IJ SOLN
INTRAMUSCULAR | Status: AC
  Filled 2023-05-11: qty 2

## 2023-05-11 MED ORDER — LIDOCAINE HCL (PF) 1 % IJ SOLN
INTRAMUSCULAR | Status: DC | PRN
  Administered 2023-05-11: 2 mL via SUBCUTANEOUS

## 2023-05-11 MED ORDER — IODIXANOL 320 MG/ML IV SOLN
INTRAVENOUS | Status: DC | PRN
  Administered 2023-05-11: 6 mL via INTRAVENOUS

## 2023-05-11 MED ORDER — LIDOCAINE HCL (PF) 1 % IJ SOLN
INTRAMUSCULAR | Status: AC
  Filled 2023-05-11: qty 30

## 2023-05-11 MED ORDER — FENTANYL CITRATE (PF) 100 MCG/2ML IJ SOLN
INTRAMUSCULAR | Status: DC | PRN
  Administered 2023-05-11: 25 ug via INTRAVENOUS

## 2023-05-11 MED ORDER — HEPARIN (PORCINE) IN NACL 1000-0.9 UT/500ML-% IV SOLN
INTRAVENOUS | Status: DC | PRN
  Administered 2023-05-11: 500 mL

## 2023-05-11 MED ORDER — FENTANYL CITRATE (PF) 100 MCG/2ML IJ SOLN
INTRAMUSCULAR | Status: AC
  Filled 2023-05-11: qty 2

## 2023-05-11 SURGICAL SUPPLY — 9 items
BAG SNAP BAND KOVER 36X36 (MISCELLANEOUS) ×2 IMPLANT
BALLN ATHLETIS 8X40X75 (BALLOONS) ×2
BALLOON ATHLETIS 8X40X75 (BALLOONS) IMPLANT
COVER DOME SNAP 22 D (MISCELLANEOUS) ×2 IMPLANT
KIT MICROPUNCTURE NIT STIFF (SHEATH) IMPLANT
SHEATH PINNACLE R/O II 6F 4CM (SHEATH) IMPLANT
SYR MEDALLION 10ML (SYRINGE) IMPLANT
TRAY PV CATH (CUSTOM PROCEDURE TRAY) ×2 IMPLANT
WIRE STARTER BENTSON 035X150 (WIRE) IMPLANT

## 2023-05-11 NOTE — Op Note (Signed)
Patient presents with decreased access flows and prolonged cannulation site bleeding from her right upper arm straight graft placed  on 09/01/2022 (Dr. Randie Heinz). On exam the RUA straight AVG is hyperpulsatile.   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   2) The body of the graft, central veins, and inflow were widely patent. 3) This right upper arm straight graft remains amenable to future percutaneous intervention. If lesion recurs in < 3months, would favor stent graft placement.   Description of procedure: The right upper arm was prepped and draped in the usual fashion. The right upper arm straight AVG was cannulated ([id number]) in the arterial limb of the graft in an antegrade direction with an 18G Angiocath needle and then a 6 Fr sheath was inserted by guidewire exchange technique. The angiogram revealed a patent body of the graft with a 80% venous anastomosis stenosis. The body of the graft, central veins and inflow were patent.  A guidewire was easily advanced past the outflow stenosis and parked in the central veins. I then advanced an 8 x 4 Athletis angioplasty balloon through the antegrade sheath over the guidewire to the level of the venous anastomosis to axillary vein stenosis. Venous angioplasty was performed to 100% balloon effacement with approximately 18ATM of pressure via a hand syringe assembly.   Final arteriogram and completion venogram revealed no evidence of extravasation or dissection, more rapid access flows through the graft and 10-20% residual stenosis in the venous anastomosis.  Hemostasis: A 3-0 ethilon purse string suture was placed at the cannulation site on removal of the sheath.  Sedation: 1mg  Versed, Fentanyl.  Sedation time: 7 minutes  Contrast. 6 mL  Monitoring: Because of the patient's comorbid conditions and sedation during the  procedure, continuous EKG monitoring and O2 saturation monitoring was performed throughout the procedure by the RN. There were no abnormal arrhythmias encountered.  Complications: None.   Diagnoses: I87.1 Stricture of vein  N18.6 ESRD T82.858A Stricture of access  Procedure Coding:  [phone] Cannulation and angiogram of fistula, venous angioplasty (AVG VA) Q6578 Contrast  Recommendations:  1. Continue to cannulate the fistula with 15G needles.  2. Refer back for problems with flows. 3. Remove the suture next treatment.   Discharge: The patient was discharged home in stable condition. The patient was given education regarding the care of the dialysis access AVG and specific instructions in case of any problems.

## 2023-05-11 NOTE — Discharge Instructions (Addendum)
Okay to discharge home ANY time after 0815 as long as clinically stable.  General care instructions: - Do not drive or operate heavy machinery for 24hrs - Avoid making any important decisions for the remainder of the day. - You should be able to eat, drink, and resume your normal medications. - Avoid any strenuous activity for the remainder of the day. Potential complications: - Your hand is more cold or numb than usual. - You are bleeding at the site and it will not stop with direct pressure. If it was a declot expect some oozing at the site. Avoid extreme pressure to the site. - You have a change in the bruit and /or thrill in your fistula or graft. - You have a fever, swelling, see redness or feel heat at or near the puncture site. Medication instructions: - Continue routine medications unless otherwise instructed. 4. Please have your sutures removed at your next scheduled dialysis treatment.

## 2023-05-11 NOTE — H&P (Signed)
Chief Complaint: Evaluation of right upper arm graft HPI:  78 year old woman with past medical history significant for hypertension, diabetes mellitus, mitral regurgitation and end-stage renal disease on hemodialysis.  She was referred today for concerns with prolonged bleeding from cannulation sites needles at dialysis as well as decreased access flows.  She denies any constitutional complaints such as fevers or chills and does not have any shortness of breath or chest pain.  She denies any nausea, vomiting or diarrhea and last ate last night.  The procedure was explained to her and she is willing to proceed with angiogram/angioplasty.  Past Medical History:  Diagnosis Date   Anemia    Anxiety    Arthritis    lower back and knees    Asthma    childhood   Barrett's esophagus    CAD (coronary artery disease)    Chronic kidney disease    Coagulation defect (HCC)    Depression    Diabetes mellitus    Dysphagia    Eczema    End stage renal disease (HCC)    Fluid overload, unspecified    History of herpes zoster 02/2010   Recovered fully after period of acute herpetic neuralgia tx w/ gabapentin.    Hx MRSA infection 2009   Hypercalcemia    Hyperlipidemia    Hypertension    Mitral regurgitation    moderate-severe MR 08/11/22 echo   Moderate protein-calorie malnutrition (HCC)    Neuromuscular disorder (HCC)    chronic pain   Personal history of colonic polyps 10/17/2010   hyperplastic   Stroke [facility])     Past Surgical History:  Procedure Laterality Date   ABDOMINAL HYSTERECTOMY     unclear when   AV FISTULA PLACEMENT Left 02/13/2017   Procedure: ARTERIOVENOUS (AV) GORE-TEX STRETCH GRAFT INSERTION INTO LEFT ARM;  Surgeon: Sherren Kerns, MD;  Location: [facility] OR;  Service: Vascular;  Laterality: Left;   AV FISTULA PLACEMENT Right 01/16/2022   Procedure: RIGHT ARM ARTERIOVENOUS (AV) FISTULA;  Surgeon: Chuck Hint, MD;  Location: [facility] OR;  Service: Vascular;  Laterality:  Right;   AV FISTULA PLACEMENT Right 09/01/2022   Procedure: INSERTION OF ARTERIOVENOUS (AV) GORE-TEX GRAFT RIGHT ARM;  Surgeon: Maeola Harman, MD;  Location: [facility] OR;  Service: Vascular;  Laterality: Right;   CATARACT EXTRACTION Bilateral    COLONOSCOPY     IR FLUORO GUIDE CV LINE RIGHT  01/09/2022   IR US GUIDE VASC ACCESS RIGHT  01/09/2022   OTHER SURGICAL HISTORY     Catherer placed by [facility], right upper chest area.   thigh surg     left side due to MRSA   UPPER GASTROINTESTINAL ENDOSCOPY      Family History  Problem Relation Age of Onset   Hypertension Father    Kidney disease Father    Diabetes Brother    Colon cancer Neg Hx    CAD Neg Hx    Rectal cancer Neg Hx    Stomach cancer Neg Hx    Social History:  reports that she has been smoking cigarettes. She has a 20 pack-year smoking history. She has never been exposed to tobacco smoke. She has never used smokeless tobacco. She reports current alcohol use of about 2.0 standard drinks of alcohol per week. She reports that she does not use drugs.  Allergies:  Allergies  Allergen Reactions   Lisinopril Other (See Comments)    Abnormal Kidney Function    Pioglitazone Other (See Comments)  REACTION: Desquamation of skin of the palm   Morphine And Codeine Nausea And Vomiting   Sulfonamide Derivatives Rash    Medications Prior to Admission  Medication Sig Dispense Refill   acetaminophen (TYLENOL) 500 MG tablet Take 1,000 mg by mouth every 8 (eight) hours as needed for headache.     aspirin 81 MG chewable tablet Chew 1 tablet (81 mg total) by mouth daily. 30 tablet 0   B Complex-C-Zn-Folic Acid (DIALYVITE 800 WITH ZINC) 0.8 MG TABS Take 0.8 mg by mouth every evening.     calcium acetate (PHOSLO) 667 MG capsule Take 1,334 mg by mouth 3 (three) times daily with meals.     clopidogrel (PLAVIX) 75 MG tablet TAKE 1 TABLET BY MOUTH EVERY DAY 90 tablet 1   ezetimibe (ZETIA) 10 MG tablet TAKE 1 TABLET BY MOUTH EVERY DAY  90 tablet 3   latanoprost (XALATAN) 0.005 % ophthalmic solution Place 1 drop into both eyes at bedtime.     pantoprazole (PROTONIX) 40 MG tablet TAKE 1 TABLET BY MOUTH EVERY DAY 90 tablet 0   PARoxetine (PAXIL) 40 MG tablet Take 40 mg by mouth every morning.     pravastatin (PRAVACHOL) 40 MG tablet TAKE 1 TABLET BY MOUTH EVERYDAY AT BEDTIME 90 tablet 3   sertraline (ZOLOFT) 50 MG tablet Take 1 tablet (50 mg total) by mouth daily. 30 tablet 1   traZODone (DESYREL) 100 MG tablet Take 1 tablet (100 mg total) by mouth at bedtime. 90 tablet 1   triamcinolone cream (KENALOG) 0.1 % APPLY 1 APPLICATION TOPICALLY 3 (THREE) TIMES DAILY AS NEEDED (RASH). 60 g 1   Cinacalcet HCl (SENSIPAR PO) Take 1 capsule by mouth Every Tuesday,Thursday,and Saturday with dialysis.      No results found for this or any previous visit (from the past 48 hours). No results found.  Review of Systems  All other systems reviewed and are negative.   Blood pressure (!) 191/103, pulse 84, resp. rate 14, SpO2 100%. Physical Exam Vitals and nursing note reviewed.  Constitutional:      Appearance: Normal appearance. She is not ill-appearing or toxic-appearing.  HENT:     Head: Normocephalic and atraumatic.     Right Ear: External ear normal.     Left Ear: External ear normal.     Nose: Nose normal.     Mouth/Throat:     Mouth: Mucous membranes are dry.     Pharynx: Oropharynx is clear.  Eyes:     Extraocular Movements: Extraocular movements intact.     Conjunctiva/sclera: Conjunctivae normal.  Cardiovascular:     Rate and Rhythm: Normal rate and regular rhythm.     Pulses: Normal pulses.     Heart sounds: Normal heart sounds.  Pulmonary:     Effort: Pulmonary effort is normal.     Breath sounds: Normal breath sounds.  Abdominal:     General: Bowel sounds are normal.     Palpations: Abdomen is soft.  Musculoskeletal:     Cervical back: Normal range of motion and neck supple.     Right lower leg: No edema.      Left lower leg: No edema.     Comments: Right upper arm straight AV graft that is hyper pulsatile with high-pitched outflow bruit  Skin:    General: Skin is warm and dry.  Neurological:     Mental Status: She is alert and oriented to person, place, and time.      Assessment/Plan 1.  Problem  with right upper arm AV graft: Physical exam supportive of outflow stenosis.  Will perform angiogram/shuntogram with plans for angioplasty of culprit lesion.  Procedure explained to patient and she is willing to proceed. 2.  End-stage renal disease: Resume hemodialysis on TTS schedule [facility]. 3.  Hypertension: Elevated blood pressure noted preprocedure, monitor with sedation/through procedure. 4.  Anemia of chronic disease: Resume ESA protocol with outpatient dialysis.  Dagoberto Ligas, MD 05/11/2023, 7:18 AM

## 2023-05-14 ENCOUNTER — Encounter (HOSPITAL_COMMUNITY): Payer: Self-pay | Admitting: Nephrology

## 2023-05-24 ENCOUNTER — Encounter (HOSPITAL_COMMUNITY): Payer: Self-pay | Admitting: *Deleted

## 2023-05-24 ENCOUNTER — Other Ambulatory Visit: Payer: Self-pay | Admitting: Nurse Practitioner

## 2023-05-24 DIAGNOSIS — L309 Dermatitis, unspecified: Secondary | ICD-10-CM

## 2023-05-26 ENCOUNTER — Other Ambulatory Visit: Payer: Self-pay | Admitting: Nurse Practitioner

## 2023-05-28 ENCOUNTER — Encounter (HOSPITAL_COMMUNITY): Payer: Self-pay | Admitting: *Deleted

## 2023-05-28 ENCOUNTER — Ambulatory Visit: Payer: 59 | Admitting: Gastroenterology

## 2023-05-28 NOTE — Progress Notes (Deleted)
 HPI :    past medical history of Barrett's esophagus, stroke on Plavix (12/03/2016 echo with LVEF 60-65%), end-stage renal disease on hemodialysis Tuesday Thursday and Saturday, diabetes and others listed below, who presents to clinic today for discussion of a screening colonoscopy.    10/17/2010 colonoscopy with mild diverticulosis in the sigmoid to descending colon, multiple polyps in the rectum, internal hemorrhoids and otherwise normal.  Pathology showed hyperplastic polyps and repeat recommended in 10 years.    06/18/2020 patient had EGD with Dr. Christella Hartigan for heartburn and surveillance for malignancy due to personal history of Barrett's esophagus with last EGD in 2015.  At that time had long segment, nonnodular Barrett's change, biopsied extensively, large hiatal hernia with obvious Sheria Lang type erosions, exam otherwise normal.  Biopsies continue to show Barrett's but with no dysplasia.  Repeat EGD recommended in 4 years. Colonoscopy October 2022 2 small tubular adenomas, pandiverticulosis Repeat colon cancer screening recommended in 7 years, to be discussed with PCP whether to continue screening   Past Medical History:  Diagnosis Date   Anemia    Anxiety    Arthritis    lower back and knees    Asthma    childhood   Barrett's esophagus    CAD (coronary artery disease)    Chronic kidney disease    Coagulation defect (HCC)    Depression    Diabetes mellitus    Dysphagia    Eczema    End stage renal disease (HCC)    Fluid overload, unspecified    History of herpes zoster 02/2010   Recovered fully after period of acute herpetic neuralgia tx w/ gabapentin.    Hx MRSA infection 2009   Hypercalcemia    Hyperlipidemia    Hypertension    Mitral regurgitation    moderate-severe MR 08/11/22 echo   Moderate protein-calorie malnutrition (HCC)    Neuromuscular disorder (HCC)    chronic pain   Personal history of colonic polyps 10/17/2010   hyperplastic   Stroke [facility])      Past  Surgical History:  Procedure Laterality Date   A/V FISTULAGRAM N/A 05/11/2023   Procedure: A/V Fistulagram;  Surgeon: Dagoberto Ligas, MD;  Location: [facility] INVASIVE CV LAB;  Service: Cardiovascular;  Laterality: N/A;   ABDOMINAL HYSTERECTOMY     unclear when   AV FISTULA PLACEMENT Left 02/13/2017   Procedure: ARTERIOVENOUS (AV) GORE-TEX STRETCH GRAFT INSERTION INTO LEFT ARM;  Surgeon: Sherren Kerns, MD;  Location: [facility] OR;  Service: Vascular;  Laterality: Left;   AV FISTULA PLACEMENT Right 01/16/2022   Procedure: RIGHT ARM ARTERIOVENOUS (AV) FISTULA;  Surgeon: Chuck Hint, MD;  Location: [facility] OR;  Service: Vascular;  Laterality: Right;   AV FISTULA PLACEMENT Right 09/01/2022   Procedure: INSERTION OF ARTERIOVENOUS (AV) GORE-TEX GRAFT RIGHT ARM;  Surgeon: Maeola Harman, MD;  Location: [facility] OR;  Service: Vascular;  Laterality: Right;   CATARACT EXTRACTION Bilateral    COLONOSCOPY     IR FLUORO GUIDE CV LINE RIGHT  01/09/2022   IR US GUIDE VASC ACCESS RIGHT  01/09/2022   OTHER SURGICAL HISTORY     Catherer placed by [facility], right upper chest area.   PERIPHERAL VASCULAR BALLOON ANGIOPLASTY  05/11/2023   Procedure: PERIPHERAL VASCULAR BALLOON ANGIOPLASTY;  Surgeon: Dagoberto Ligas, MD;  Location: [facility] INVASIVE CV LAB;  Service: Cardiovascular;;  Venous Anastomosis   thigh surg     left side due to MRSA   UPPER GASTROINTESTINAL ENDOSCOPY  Family History  Problem Relation Age of Onset   Hypertension Father    Kidney disease Father    Diabetes Brother    Colon cancer Neg Hx    CAD Neg Hx    Rectal cancer Neg Hx    Stomach cancer Neg Hx    Social History   Tobacco Use   Smoking status: Every Day    Current packs/day: 0.50    Average packs/day: 0.5 packs/day for 40.0 years (20.0 ttl pk-yrs)    Types: Cigarettes    Passive exposure: Never   Smokeless tobacco: Never   Tobacco comments:    smokes 1 pack of cigerettes a week- off and on  Vaping Use   Vaping status: Never  Used  Substance Use Topics   Alcohol use: Yes    Alcohol/week: 2.0 standard drinks of alcohol    Types: 2 Standard drinks or equivalent per week    Comment: wine and beer- occasionally    Drug use: No   Current Outpatient Medications  Medication Sig Dispense Refill   acetaminophen (TYLENOL) 500 MG tablet Take 1,000 mg by mouth every 8 (eight) hours as needed for headache.     aspirin 81 MG chewable tablet Chew 1 tablet (81 mg total) by mouth daily. 30 tablet 0   B Complex-C-Zn-Folic Acid (DIALYVITE 800 WITH ZINC) 0.8 MG TABS Take 0.8 mg by mouth every evening.     calcium acetate (PHOSLO) 667 MG capsule Take 1,334 mg by mouth 3 (three) times daily with meals.     Cinacalcet HCl (SENSIPAR PO) Take 1 capsule by mouth Every Tuesday,Thursday,and Saturday with dialysis.     clopidogrel (PLAVIX) 75 MG tablet TAKE 1 TABLET BY MOUTH EVERY DAY 90 tablet 1   ezetimibe (ZETIA) 10 MG tablet TAKE 1 TABLET BY MOUTH EVERY DAY 90 tablet 3   latanoprost (XALATAN) 0.005 % ophthalmic solution Place 1 drop into both eyes at bedtime.     pantoprazole (PROTONIX) 40 MG tablet TAKE 1 TABLET BY MOUTH EVERY DAY 90 tablet 0   PARoxetine (PAXIL) 40 MG tablet Take 40 mg by mouth every morning.     pravastatin (PRAVACHOL) 40 MG tablet TAKE 1 TABLET BY MOUTH EVERYDAY AT BEDTIME 90 tablet 3   sertraline (ZOLOFT) 50 MG tablet Take 1 tablet (50 mg total) by mouth daily. 30 tablet 1   traZODone (DESYREL) 100 MG tablet Take 1 tablet (100 mg total) by mouth at bedtime. 90 tablet 1   triamcinolone cream (KENALOG) 0.1 % APPLY 1 APPLICATION TOPICALLY 3 (THREE) TIMES DAILY AS NEEDED (RASH). 60 g 1   No current facility-administered medications for this visit.   Allergies  Allergen Reactions   Lisinopril Other (See Comments)    Abnormal Kidney Function    Pioglitazone Other (See Comments)    REACTION: Desquamation of skin of the palm   Morphine And Codeine Nausea And Vomiting   Sulfonamide Derivatives Rash     Review of  Systems: All systems reviewed and negative except where noted in HPI.    PERIPHERAL VASCULAR CATHETERIZATION Result Date: 05/11/2023   In the absence of any other complications or medical issues, we expect the patient to be ready for discharge from an interventional cardiology perspective on 05/11/2023.   No indication for antiplatelet therapy at this time . Patient presents with decreased access flows and prolonged cannulation site bleeding from her right upper arm straight graft placed  on 09/01/2022 (Dr. Randie Heinz). On exam the RUA straight AVG is hyperpulsatile. 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  2) The body of the graft, central veins, and inflow were widely patent. 3) This right upper arm straight graft remains amenable to future percutaneous intervention. If lesion recurs in < 3months, would favor stent graft placement. Description of procedure: The right upper arm was prepped and draped in the usual fashion. The right upper arm straight AVG was cannulated (40981) in the arterial limb of the graft in an antegrade direction with an 18G Angiocath needle and then a 6 Fr sheath was inserted by guidewire exchange technique. The angiogram revealed a patent body of the graft with a 80% venous anastomosis stenosis. The body of the graft, central veins and inflow were patent. A guidewire was easily advanced past the outflow stenosis and parked in the central veins. I then advanced an 8 x 4 Athletis angioplasty balloon through the antegrade sheath over the guidewire to the level of the venous anastomosis to axillary vein stenosis. Venous angioplasty was performed to 100% balloon effacement with approximately 18ATM of pressure via a hand syringe assembly.  Final arteriogram and completion venogram revealed no evidence of extravasation or dissection, more rapid access flows through the  graft and 10-20% residual stenosis in the venous anastomosis. Hemostasis: A 3-0 ethilon purse string suture was placed at the cannulation site on removal of the sheath. Sedation: 1mg  Versed, Fentanyl. Sedation time: 7 minutes Contrast. 6 mL Monitoring: Because of the patient's comorbid conditions and sedation during the procedure, continuous EKG monitoring and O2 saturation monitoring was performed throughout the procedure by the RN. There were no abnormal arrhythmias encountered. Complications: None. 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 Continue to cannulate the fistula with 15G needles. 2. Refer back for problems with flows. 3. Remove the suture next treatment. Discharge: The patient was discharged home in stable condition. The patient was given education regarding the care of the dialysis access AVG and specific instructions in case of any problems.    Physical Exam: There were no vitals taken for this visit. Constitutional: Pleasant,well-developed, ***female in no acute distress. HEENT: Normocephalic and atraumatic. Conjunctivae are normal. No scleral icterus. Neck supple.  Cardiovascular: Normal rate, regular rhythm.  Pulmonary/chest: Effort normal and breath sounds normal. No wheezing, rales or rhonchi. Abdominal: Soft, nondistended, nontender. Bowel sounds active throughout. There are no masses palpable. No hepatomegaly. Extremities: no edema Lymphadenopathy: No cervical adenopathy noted. Neurological: Alert and oriented to person place and time. Skin: Skin is warm and dry. No rashes noted. Psychiatric: Normal mood and affect. Behavior is normal.  CBC    Component Value Date/Time   WBC 8.1 01/08/2022 1821   RBC 2.81 (L) 01/08/2022 1821   HGB 11.2 (L) 09/01/2022 0953   HGB 11.2 08/27/2015 0937   HCT 33.0 (L) 09/01/2022  0953   HCT 33.7 (L) 08/27/2015 0937   PLT 154 01/08/2022 1821   PLT 278 08/27/2015 0937   MCV 101.1 (H) 01/08/2022 1821   MCV 86 08/27/2015 0937   MCH 33.5 01/08/2022 1821   MCHC 33.1 01/08/2022 1821   RDW 15.3 01/08/2022 1821   RDW 15.4 08/27/2015 0937   LYMPHSABS 2.2 01/08/2022 1821   LYMPHSABS 2.1 08/27/2015 0937   MONOABS 0.5 01/08/2022 1821   EOSABS 0.7 (  H) 01/08/2022 1821   EOSABS 0.4 08/27/2015 0937   BASOSABS 0.1 01/08/2022 1821   BASOSABS 0.1 08/27/2015 0937    CMP     Component Value Date/Time   NA 140 09/01/2022 0953   NA 142 09/20/2017 0000   K 3.8 09/01/2022 0953   CL 99 09/01/2022 0953   CO2 28 01/08/2022 1821   GLUCOSE 96 09/01/2022 0953   BUN 41 (H) 09/01/2022 0953   BUN 20 09/08/2015 0859   CREATININE 9.70 (H) 09/01/2022 0953   CREATININE 6.33 (H) 09/28/2016 0903   CALCIUM 9.5 05/24/2022 0000   CALCIUM 7.9 (L) 03/09/2017 0857   PROT 5.5 (L) 01/08/2022 1821   PROT 6.1 08/27/2015 0937   ALBUMIN 4.1 05/24/2022 0000   ALBUMIN 3.5 (L) 08/27/2015 0937   AST 32 01/08/2022 1821   ALT 21 01/08/2022 1821   ALKPHOS 72 01/08/2022 1821   BILITOT 0.6 01/08/2022 1821   BILITOT 0.3 08/27/2015 0937   GFRNONAA 3 (L) 01/08/2022 1821   GFRNONAA 6 (L) 09/28/2016 0903   GFRAA 7 (L) 07/30/2018 1311   GFRAA 7 (L) 09/28/2016 0903       Latest Ref Rng & Units 09/01/2022    9:53 AM 05/24/2022   12:00 AM 01/16/2022    8:36 AM  CBC EXTENDED  Hemoglobin 12.0 - 15.0 g/dL 16.1  09.6     04.5   HCT 36.0 - 46.0 % 33.0   30.0      This result is from an external source.      ASSESSMENT AND PLAN:  Sharon Seller, NP

## 2023-05-28 NOTE — Telephone Encounter (Signed)
 High risk or very high risk warning populated when attempting to refill medication. RX request sent to PCP for review and approval if warranted.

## 2023-05-30 ENCOUNTER — Encounter (HOSPITAL_COMMUNITY)
Admission: RE | Disposition: A | Discharge status: Home / Self Care | Payer: Self-pay | Source: Home / Self Care | Attending: Nephrology

## 2023-05-30 ENCOUNTER — Ambulatory Visit (HOSPITAL_COMMUNITY)
Admission: RE | Admit: 2023-05-30 | Discharge: 2023-05-30 | Disposition: A | Discharge status: Home / Self Care | Payer: 59 | Attending: Nephrology | Admitting: Nephrology

## 2023-05-30 ENCOUNTER — Other Ambulatory Visit: Payer: Self-pay

## 2023-05-30 DIAGNOSIS — Z79899 Other long term (current) drug therapy: Secondary | ICD-10-CM | POA: Diagnosis not present

## 2023-05-30 DIAGNOSIS — Y832 Surgical operation with anastomosis, bypass or graft as the cause of abnormal reaction of the patient, or of later complication, without mention of misadventure at the time of the procedure: Secondary | ICD-10-CM | POA: Diagnosis not present

## 2023-05-30 DIAGNOSIS — I120 Hypertensive chronic kidney disease with stage 5 chronic kidney disease or end stage renal disease: Secondary | ICD-10-CM | POA: Diagnosis not present

## 2023-05-30 DIAGNOSIS — Z992 Dependence on renal dialysis: Secondary | ICD-10-CM | POA: Diagnosis not present

## 2023-05-30 DIAGNOSIS — N186 End stage renal disease: Secondary | ICD-10-CM | POA: Insufficient documentation

## 2023-05-30 DIAGNOSIS — F17210 Nicotine dependence, cigarettes, uncomplicated: Secondary | ICD-10-CM | POA: Diagnosis not present

## 2023-05-30 DIAGNOSIS — T82868A Thrombosis of vascular prosthetic devices, implants and grafts, initial encounter: Secondary | ICD-10-CM | POA: Diagnosis present

## 2023-05-30 DIAGNOSIS — D631 Anemia in chronic kidney disease: Secondary | ICD-10-CM | POA: Insufficient documentation

## 2023-05-30 DIAGNOSIS — T82858A Stenosis of vascular prosthetic devices, implants and grafts, initial encounter: Secondary | ICD-10-CM | POA: Insufficient documentation

## 2023-05-30 DIAGNOSIS — I340 Nonrheumatic mitral (valve) insufficiency: Secondary | ICD-10-CM | POA: Diagnosis not present

## 2023-05-30 DIAGNOSIS — E1122 Type 2 diabetes mellitus with diabetic chronic kidney disease: Secondary | ICD-10-CM | POA: Diagnosis not present

## 2023-05-30 HISTORY — PX: PERIPHERAL VASCULAR BALLOON ANGIOPLASTY: CATH118281

## 2023-05-30 HISTORY — PX: PERIPHERAL VASCULAR THROMBECTOMY: CATH118306

## 2023-05-30 SURGERY — PERIPHERAL VASCULAR THROMBECTOMY
Anesthesia: LOCAL

## 2023-05-30 MED ORDER — MIDAZOLAM HCL 2 MG/2ML IJ SOLN
INTRAMUSCULAR | Status: DC | PRN
  Administered 2023-05-30: 1 mg via INTRAVENOUS

## 2023-05-30 MED ORDER — HEPARIN SODIUM (PORCINE) 1000 UNIT/ML IJ SOLN
INTRAMUSCULAR | Status: AC
  Filled 2023-05-30: qty 10

## 2023-05-30 MED ORDER — LIDOCAINE HCL (PF) 1 % IJ SOLN
INTRAMUSCULAR | Status: AC
  Filled 2023-05-30: qty 30

## 2023-05-30 MED ORDER — IODIXANOL 320 MG/ML IV SOLN
INTRAVENOUS | Status: DC | PRN
  Administered 2023-05-30: 20 mL via INTRAVENOUS

## 2023-05-30 MED ORDER — HEPARIN (PORCINE) IN NACL 1000-0.9 UT/500ML-% IV SOLN
INTRAVENOUS | Status: DC | PRN
  Administered 2023-05-30: 500 mL

## 2023-05-30 MED ORDER — FENTANYL CITRATE (PF) 100 MCG/2ML IJ SOLN
INTRAMUSCULAR | Status: DC | PRN
  Administered 2023-05-30: 50 ug via INTRAVENOUS

## 2023-05-30 MED ORDER — HEPARIN SODIUM (PORCINE) 1000 UNIT/ML IJ SOLN
INTRAMUSCULAR | Status: DC | PRN
  Administered 2023-05-30: 5000 [IU] via INTRAVENOUS

## 2023-05-30 MED ORDER — LIDOCAINE HCL (PF) 1 % IJ SOLN
INTRAMUSCULAR | Status: DC | PRN
  Administered 2023-05-30: 4 mL via SUBCUTANEOUS

## 2023-05-30 MED ORDER — MIDAZOLAM HCL 2 MG/2ML IJ SOLN
INTRAMUSCULAR | Status: AC
Start: 2023-05-30 — End: ?
  Filled 2023-05-30: qty 2

## 2023-05-30 MED ORDER — FENTANYL CITRATE (PF) 100 MCG/2ML IJ SOLN
INTRAMUSCULAR | Status: AC
  Filled 2023-05-30: qty 2

## 2023-05-30 SURGICAL SUPPLY — 11 items
BAG SNAP BAND KOVER 36X36 (MISCELLANEOUS) ×2
BALLN MUSTANG 7.0X40 75 (BALLOONS) ×2
BALLN MUSTANG 8X80X75 (BALLOONS) ×2
CATH FOGARTY BALL 5FR 40 (CATHETERS) ×2
CATH STR 7FR 55 BRITE (CATHETERS) ×2
COVER DOME SNAP 22 D (MISCELLANEOUS) ×2
GUIDEWIRE ANGLED .035X150CM (WIRE) ×4
SHEATH PINNACLE R/O II 5F 6CM (SHEATH) ×2
SHEATH PINNACLE R/O II 7F 4CM (SHEATH) ×6
SYR MEDALLION 10ML (SYRINGE) ×2
TRAY PV CATH (CUSTOM PROCEDURE TRAY) ×2

## 2023-05-30 NOTE — Discharge Instructions (Signed)

## 2023-05-30 NOTE — Op Note (Signed)
 Patient referred for a thrombectomy of the right upper arm AVG recent outflow 8 mm angioplasty of the VA on January 17 by Dr. Tobie.  On examination there is no pulse or bruit in the right upper arm arc graft. The left radial pulse is 1+.   Summary:  1) Successful thrombectomy of a right upper arm arc AVG with evidence of 50% outflow venous anastomosis stenosis which was corrected with angioplasty (8x8 Mustang FE ~10 ATM).  It was not deployed as the [place] does not appear to be the main culprit and I suspect hypotension may have been the reason she thrombosed. 2) 50% arterial anastomosis stenosis treated with a 7 x 4 Mustang fully effaced approximately 5 atm of pressure.  Main issue with the thrombectomy was getting inflow reestablished; once it was reestablished flows were rapid. 3) Patent centrals and brachial artery.  Certainly has steal physiology present.   4) This right upper arm arc graft remains amenable to future percutaneous intervention.  Description of procedure: The right arm was prepped and draped in the usual fashion. The right upper arm arc AVG was first cannulated (63094) in the arterial limb in an antegrade direction and then a 7Fr sheath was inserted by guidewire exchange technique. A 0.035 guidewire was passed through the clotted graft into the central veins under fluoroscopic guidance and then a 5Fr diagnostic catheter inserted over the wire. The wire was removed, and then intravenous heparin  and sedative drugs administered. A pullback angiogram was performed by injecting contrast (V0032) via the diagnostic catheter. This showed patent central veins, patent right axillary vein, 50% venous anastomosis stenosis and evidence of filling defects in the graft consistent with thrombus.  Three passes were made with the 7Fr [facility] 1 catheter from the axillary vein to the antegrade sheath to physically remove thrombus and perform the thrombectomy (63094). The catheter was removed, and an 8 x 8  Mustang angioplasty balloon was inserted over the wire to the level of the mid-intragraft lesion followed by venous angioplasty (63094) carried out to 18ATM at only the site of stenosis to FULL effacement and then more gently in the rest of the graft circuit to macerate the thrombus.    In order to remove this clot and remove the platelet plug at the arterial anastomosis, a second cannulation (63094) was required in a retrograde direction. A 7Fr sheath was inserted by guidewire exchange technique. A 4Fr Fogarty catheter was inserted through the sheath and advanced into the distal and later proximal brachial artery. The over the wire embolectomy balloon was inflated with 0.6cc of contrast and then pulled across the arterial anastomosis into the graft with aspiration of the sheath via the side port to remove the platelet plug and thrombus. [phone]). This step was repeated to sweep all residual clot from this side of the graft. In order to check the arterial inflow an arteriogram was necessary because refluxing contrast from the graft would have caused risk of embolizing thrombus from the graft into the arterial vasculature. A glidewire and straight catheter were inserted into the proximal brachial artery and an arteriogram performed by parking the tip of the catheter 2cm proximal to the arterial anastomosis. The arteriogram documented a good calibered proximal and distal brachial artery with report flow into the graft.  At this point I advanced a 7 x 4 Mustang balloon to the level of the inflow anastomosis and inflow segment of the graft with full balloon effacement achieved at only 5 to 8 atm of pressure.  I then polished the entire outflow tract/graft circuit with the partially inflated 8mm angioplasty balloon to alleviate thrombus burden.  Completion outflow angiogram revealed rapid flows, no further retained thrombus burden, 10% residual stenosis at all levels with no dissection or extravasation. The graft  had a good thrill and bruit.  Hemostasis: A 3-0 ethilon purse string suture was placed at the cannulation site on removal of the sheath.  Sedation: 1mg  Versed , 50 mcg Fentanyl . Sedation time. 32  minutes  Contrast. 20 mL  Monitoring: Because of the patient's comorbid conditions and sedation during the procedure, continuous EKG monitoring and O2 saturation monitoring was performed throughout the procedure by the RN. There were no abnormal arrhythmias encountered.  Complications: None.   Diagnoses: N18.6 End stage renal disease  T82.858A Stenosis of vascular prosthetic devices, implants and grafts, initial encounter T82.868A Thrombosis of vascular prosthetic device/graft, initial  Procedure Coding:  [phone] Cannulation and angiogram of fistula/ graft, thrombectomy and venous angioplasty V0032 Contrast  Recommendations:  1. Continue to cannulate the graft with 15G needles.  2. Refer back for problems with flows. 3. Remove the sutures next treatment.   Discharge: The patient was discharged home in stable condition. The patient was given education regarding the care of the dialysis access AVF and specific instructions in case of any problems.

## 2023-05-30 NOTE — H&P (Addendum)
 Chief Complaint: Thrombosed right upper arm graft  Interval H&P  The patient has presented today for a declot/angiogram/ angioplasty.  Various methods of treatment have been discussed with the patient.  After consideration of risk, benefits and other options for treatment, the patient has consented to a angiogram/ angioplasty with  possible stent placement.   Risks of angiogram with potential angioplasty and stenting if needed.contrast reaction, extravasation/ bleeding, dissection, hypotension and death were explained to the patient.  The patient's history has been reviewed and the patient has been examined, no changes in status.  Stable for angiogram/angioplasty  I have reviewed the patient's chart and labs.  Questions were answered to the patient's satisfaction.  Assessment/Plan 1.  Thrombosed right upper arm AV graft: Physical exam supportive of arm post access; will perform thrombectomy with plans for angioplasty of culprit lesion and likely stent graft placement.  Procedure explained to patient and she is willing to proceed. 2.  End-stage renal disease: Resume hemodialysis on TTS schedule at [facility]. 3.  Hypertension: Elevated blood pressure noted preprocedure, monitor with sedation/through procedure. 4.  Anemia of chronic disease: Resume ESA protocol with outpatient dialysis.  HPI:  78 year old woman with past medical history significant for hypertension, diabetes mellitus, mitral regurgitation and end-stage renal disease on hemodialysis.  She was referred today for a thrombosed AV graft with recent intervention by Dr. Tobie utilizing 8 mm PTA of the [facility] January 17.    She denies any constitutional complaints such as fevers or chills and does not have any shortness of breath or chest pain.  She denies any nausea, vomiting or diarrhea and last ate last night.  The procedure was explained to her and she is willing to proceed with declot/angiogram/angioplasty.  GEN: NAD,  A&Ox3, NCAT HEENT: No conjunctival pallor, EOMI NECK: Supple, no thyromegaly LUNGS: CTA B/L no rales, rhonchi or wheezing CV: RRR, No M/R/G ABD: SNDNT +BS  EXT: No lower extremity edema ACCESS: RUA AVG thrombosed  Past Medical History:  Diagnosis Date   Anemia    Anxiety    Arthritis    lower back and knees    Asthma    childhood   Barrett's esophagus    CAD (coronary artery disease)    Chronic kidney disease    Coagulation defect (HCC)    Depression    Diabetes mellitus    Dysphagia    Eczema    End stage renal disease (HCC)    Fluid overload, unspecified    History of herpes zoster 02/2010   Recovered fully after period of acute herpetic neuralgia tx w/ gabapentin .    Hx MRSA infection 2009   Hypercalcemia    Hyperlipidemia    Hypertension    Mitral regurgitation    moderate-severe MR 08/11/22 echo   Moderate protein-calorie malnutrition (HCC)    Neuromuscular disorder (HCC)    chronic pain   Personal history of colonic polyps 10/17/2010   hyperplastic   Stroke [facility])     Past Surgical History:  Procedure Laterality Date   A/V FISTULAGRAM N/A 05/11/2023   Procedure: A/V Fistulagram;  Surgeon: [name], MD;  Location: [facility] INVASIVE CV LAB;  Service: Cardiovascular;  Laterality: N/A;   ABDOMINAL HYSTERECTOMY     unclear when   AV FISTULA PLACEMENT Left 02/13/2017   Procedure: ARTERIOVENOUS (AV) GORE-TEX STRETCH GRAFT INSERTION INTO LEFT ARM;  Surgeon: Harvey Carlin BRAVO, MD;  Location: [facility] OR;  Service: Vascular;  Laterality: Left;   AV FISTULA PLACEMENT Right 01/16/2022  Procedure: RIGHT ARM ARTERIOVENOUS (AV) FISTULA;  Surgeon: [name], MD;  Location: [facility] OR;  Service: Vascular;  Laterality: Right;   AV FISTULA PLACEMENT Right 09/01/2022   Procedure: INSERTION OF ARTERIOVENOUS (AV) GORE-TEX GRAFT RIGHT ARM;  Surgeon: Sheree Penne Lonni, MD;  Location: [facility] OR;  Service: Vascular;  Laterality: Right;   CATARACT EXTRACTION Bilateral     COLONOSCOPY     IR FLUORO GUIDE CV LINE RIGHT  01/09/2022   IR US  GUIDE VASC ACCESS RIGHT  01/09/2022   OTHER SURGICAL HISTORY     Catherer placed by [facility], right upper chest area.   PERIPHERAL VASCULAR BALLOON ANGIOPLASTY  05/11/2023   Procedure: PERIPHERAL VASCULAR BALLOON ANGIOPLASTY;  Surgeon: [name], MD;  Location: [facility] INVASIVE CV LAB;  Service: Cardiovascular;;  Venous Anastomosis   thigh surg     left side due to MRSA   UPPER GASTROINTESTINAL ENDOSCOPY      Family History  Problem Relation Age of Onset   Hypertension Father    Kidney disease Father    Diabetes Brother    Colon cancer Neg Hx    CAD Neg Hx    Rectal cancer Neg Hx    Stomach cancer Neg Hx    Social History:  reports that she has been smoking cigarettes. She has a 20 pack-year smoking history. She has never been exposed to tobacco smoke. She has never used smokeless tobacco. She reports current alcohol use of about 2.0 standard drinks of alcohol per week. She reports that she does not use drugs.  Allergies:  Allergies  Allergen Reactions   Lisinopril  Other (See Comments)    Abnormal Kidney Function    Pioglitazone Other (See Comments)    REACTION: Desquamation of skin of the palm   Morphine And Codeine Nausea And Vomiting   Sulfonamide Derivatives Rash    Medications Prior to Admission  Medication Sig Dispense Refill   acetaminophen  (TYLENOL ) 500 MG tablet Take 1,000 mg by mouth every 8 (eight) hours as needed for headache.     aspirin  81 MG chewable tablet Chew 1 tablet (81 mg total) by mouth daily. 30 tablet 0   B Complex-C-Zn-Folic Acid  (DIALYVITE 800 WITH ZINC ) 0.8 MG TABS Take 0.8 mg by mouth every evening.     calcium  acetate (PHOSLO) 667 MG capsule Take 1,334 mg by mouth 3 (three) times daily with meals.     Cinacalcet  HCl (SENSIPAR  PO) Take 1 capsule by mouth Every Tuesday,Thursday,and Saturday with dialysis.     clopidogrel  (PLAVIX ) 75 MG tablet TAKE 1 TABLET BY MOUTH EVERY DAY 90 tablet  1   ezetimibe  (ZETIA ) 10 MG tablet TAKE 1 TABLET BY MOUTH EVERY DAY 90 tablet 3   latanoprost  (XALATAN ) 0.005 % ophthalmic solution Place 1 drop into both eyes at bedtime.     pantoprazole  (PROTONIX ) 40 MG tablet TAKE 1 TABLET BY MOUTH EVERY DAY 90 tablet 0   PARoxetine  (PAXIL ) 40 MG tablet Take 40 mg by mouth every morning.     pravastatin  (PRAVACHOL ) 40 MG tablet TAKE 1 TABLET BY MOUTH EVERYDAY AT BEDTIME 90 tablet 3   sertraline  (ZOLOFT ) 50 MG tablet TAKE 1 TABLET BY MOUTH EVERY DAY 90 tablet 1   traZODone  (DESYREL ) 100 MG tablet Take 1 tablet (100 mg total) by mouth at bedtime. 90 tablet 1   triamcinolone  cream (KENALOG ) 0.1 % APPLY 1 APPLICATION TOPICALLY 3 (THREE) TIMES DAILY AS NEEDED (RASH). 60 g 1    No results found for this  or any previous visit (from the past 48 hours). No results found.  Review of Systems  Neurological:  Syncope: .[id number].  All other systems reviewed and are negative.   Blood pressure (!) 194/99, pulse 78, resp. rate 14, weight 77.1 kg, SpO2 99%.      [name], MD 05/30/2023, 8:10 AM

## 2023-05-31 ENCOUNTER — Encounter (HOSPITAL_COMMUNITY): Payer: Self-pay | Admitting: Nephrology

## 2023-06-04 ENCOUNTER — Encounter: Payer: 59 | Admitting: Nurse Practitioner

## 2023-06-04 NOTE — Progress Notes (Signed)
 This encounter was created in error - please disregard.

## 2023-07-18 ENCOUNTER — Other Ambulatory Visit: Payer: Self-pay | Admitting: Nurse Practitioner

## 2023-07-18 DIAGNOSIS — L309 Dermatitis, unspecified: Secondary | ICD-10-CM

## 2023-07-23 ENCOUNTER — Encounter (HOSPITAL_COMMUNITY): Payer: Self-pay | Admitting: Cardiovascular Disease

## 2023-08-19 ENCOUNTER — Other Ambulatory Visit: Payer: Self-pay | Admitting: Nurse Practitioner

## 2023-08-19 DIAGNOSIS — I639 Cerebral infarction, unspecified: Secondary | ICD-10-CM

## 2023-08-22 ENCOUNTER — Other Ambulatory Visit: Payer: Self-pay | Admitting: Nurse Practitioner

## 2023-08-22 ENCOUNTER — Other Ambulatory Visit: Payer: Self-pay | Admitting: Gastroenterology

## 2023-08-22 NOTE — Telephone Encounter (Signed)
 High risk warning populated when attempting to refill medication  Please review and approval if appropriate   Thanks,  [facility] W/CMA

## 2023-08-24 ENCOUNTER — Encounter (HOSPITAL_COMMUNITY): Payer: Self-pay | Admitting: *Deleted

## 2023-08-31 ENCOUNTER — Encounter (HOSPITAL_COMMUNITY): Payer: Self-pay | Admitting: *Deleted

## 2023-08-31 ENCOUNTER — Ambulatory Visit (HOSPITAL_COMMUNITY): Attending: Cardiology

## 2023-08-31 DIAGNOSIS — I340 Nonrheumatic mitral (valve) insufficiency: Secondary | ICD-10-CM | POA: Diagnosis present

## 2023-08-31 LAB — ECHOCARDIOGRAM COMPLETE
Area-P 1/2: 2.09 cm2
S' Lateral: 2.71 cm

## 2023-09-01 ENCOUNTER — Encounter (HOSPITAL_COMMUNITY): Payer: Self-pay | Admitting: Emergency Medicine

## 2023-09-01 ENCOUNTER — Other Ambulatory Visit: Payer: Self-pay | Admitting: Radiology

## 2023-09-01 ENCOUNTER — Other Ambulatory Visit: Payer: Self-pay

## 2023-09-01 ENCOUNTER — Emergency Department (HOSPITAL_COMMUNITY)

## 2023-09-01 ENCOUNTER — Emergency Department (HOSPITAL_COMMUNITY)
Admission: EM | Admit: 2023-09-01 | Discharge: 2023-09-01 | Disposition: A | Discharge status: Home / Self Care | Attending: Emergency Medicine | Admitting: Emergency Medicine

## 2023-09-01 DIAGNOSIS — J45909 Unspecified asthma, uncomplicated: Secondary | ICD-10-CM | POA: Diagnosis not present

## 2023-09-01 DIAGNOSIS — T82590A Other mechanical complication of surgically created arteriovenous fistula, initial encounter: Secondary | ICD-10-CM | POA: Diagnosis present

## 2023-09-01 DIAGNOSIS — T82399A Other mechanical complication of unspecified vascular grafts, initial encounter: Secondary | ICD-10-CM

## 2023-09-01 DIAGNOSIS — T829XXA Unspecified complication of cardiac and vascular prosthetic device, implant and graft, initial encounter: Secondary | ICD-10-CM

## 2023-09-01 DIAGNOSIS — N186 End stage renal disease: Secondary | ICD-10-CM

## 2023-09-01 DIAGNOSIS — Z7982 Long term (current) use of aspirin: Secondary | ICD-10-CM | POA: Insufficient documentation

## 2023-09-01 DIAGNOSIS — Z79899 Other long term (current) drug therapy: Secondary | ICD-10-CM | POA: Diagnosis not present

## 2023-09-01 DIAGNOSIS — Z992 Dependence on renal dialysis: Secondary | ICD-10-CM | POA: Diagnosis not present

## 2023-09-01 DIAGNOSIS — E1122 Type 2 diabetes mellitus with diabetic chronic kidney disease: Secondary | ICD-10-CM | POA: Diagnosis not present

## 2023-09-01 DIAGNOSIS — I2510 Atherosclerotic heart disease of native coronary artery without angina pectoris: Secondary | ICD-10-CM | POA: Insufficient documentation

## 2023-09-01 DIAGNOSIS — Z7902 Long term (current) use of antithrombotics/antiplatelets: Secondary | ICD-10-CM | POA: Insufficient documentation

## 2023-09-01 DIAGNOSIS — I120 Hypertensive chronic kidney disease with stage 5 chronic kidney disease or end stage renal disease: Secondary | ICD-10-CM | POA: Diagnosis not present

## 2023-09-01 LAB — CBC WITH DIFFERENTIAL/PLATELET
Abs Immature Granulocytes: 0.02 10*3/uL (ref 0.00–0.07)
Basophils Absolute: 0.1 10*3/uL (ref 0.0–0.1)
Basophils Relative: 1 %
Eosinophils Absolute: 0.6 10*3/uL — ABNORMAL HIGH (ref 0.0–0.5)
Eosinophils Relative: 7 %
HCT: 35.1 % — ABNORMAL LOW (ref 36.0–46.0)
Hemoglobin: 11.5 g/dL — ABNORMAL LOW (ref 12.0–15.0)
Immature Granulocytes: 0 %
Lymphocytes Relative: 18 %
Lymphs Abs: 1.4 10*3/uL (ref 0.7–4.0)
MCH: 32.9 pg (ref 26.0–34.0)
MCHC: 32.8 g/dL (ref 30.0–36.0)
MCV: 100.3 fL — ABNORMAL HIGH (ref 80.0–100.0)
Monocytes Absolute: 0.6 10*3/uL (ref 0.1–1.0)
Monocytes Relative: 8 %
Neutro Abs: 5.2 10*3/uL (ref 1.7–7.7)
Neutrophils Relative %: 66 %
Platelets: 198 10*3/uL (ref 150–400)
RBC: 3.5 MIL/uL — ABNORMAL LOW (ref 3.87–5.11)
RDW: 15.1 % (ref 11.5–15.5)
WBC: 7.8 10*3/uL (ref 4.0–10.5)
nRBC: 0 % (ref 0.0–0.2)

## 2023-09-01 LAB — COMPREHENSIVE METABOLIC PANEL WITH GFR
ALT: 23 U/L (ref 0–44)
AST: 24 U/L (ref 15–41)
Albumin: 3.8 g/dL (ref 3.5–5.0)
Alkaline Phosphatase: 93 U/L (ref 38–126)
Anion gap: 16 — ABNORMAL HIGH (ref 5–15)
BUN: 38 mg/dL — ABNORMAL HIGH (ref 8–23)
CO2: 29 mmol/L (ref 22–32)
Calcium: 8.8 mg/dL — ABNORMAL LOW (ref 8.9–10.3)
Chloride: 96 mmol/L — ABNORMAL LOW (ref 98–111)
Creatinine, Ser: 10.19 mg/dL — ABNORMAL HIGH (ref 0.44–1.00)
GFR, Estimated: 4 mL/min — ABNORMAL LOW (ref >60)
Glucose, Bld: 101 mg/dL — ABNORMAL HIGH (ref 70–99)
Potassium: 4.4 mmol/L (ref 3.5–5.1)
Sodium: 141 mmol/L (ref 135–145)
Total Bilirubin: 0.7 mg/dL (ref 0.0–1.2)
Total Protein: 6.8 g/dL (ref 6.5–8.1)

## 2023-09-01 NOTE — Progress Notes (Signed)
 VASCULAR LAB    Right upper extremity dialysis graft evaluation has been performed.  See CV proc for preliminary results.  Relayed results to Dr. Martina Sledge and Paul Boring, PA-C via secure chat  Carleene Chase, RVT 09/01/2023, 9:46 AM

## 2023-09-01 NOTE — ED Provider Notes (Signed)
 [facility] Provider Note   CSN: [id number] Arrival date & time: 09/01/23  0801     History  Chief Complaint  Patient presents with   Vascular Access Problem    [name] is a 78 y.o. female with history of ESRD on HD (Tu, Thurs, Sat), diabetes, hypertension, hyperlipidemia, anxiety, arthritis, anemia, asthma presents with complaints of concern for her right upper extremity fistula.  States that she went to dialysis earlier today and that they are concerned that there was a blood clot.  Patient is entirely asymptomatic without any arm pain, chest pain, shortness of breath, abdominal pain.  Last completed session was Thursday.  HPI  Past Medical History:  Diagnosis Date   Anemia    Anxiety    Arthritis    lower back and knees    Asthma    childhood   Barrett's esophagus    CAD (coronary artery disease)    Chronic kidney disease    Coagulation defect (HCC)    Depression    Diabetes mellitus    Dysphagia    Eczema    End stage renal disease (HCC)    Fluid overload, unspecified    History of herpes zoster 02/2010   Recovered fully after period of acute herpetic neuralgia tx w/ gabapentin .    Hx MRSA infection 2009   Hypercalcemia    Hyperlipidemia    Hypertension    Mitral regurgitation    moderate-severe MR 08/11/22 echo   Moderate protein-calorie malnutrition (HCC)    Neuromuscular disorder (HCC)    chronic pain   Personal history of colonic polyps 10/17/2010   hyperplastic   Stroke [facility])    Past Surgical History:  Procedure Laterality Date   A/V FISTULAGRAM N/A 05/11/2023   Procedure: A/V Fistulagram;  Surgeon: Melodie Spry, MD;  Location: [facility] INVASIVE CV LAB;  Service: Cardiovascular;  Laterality: N/A;   ABDOMINAL HYSTERECTOMY     unclear when   AV FISTULA PLACEMENT Left 02/13/2017   Procedure: ARTERIOVENOUS (AV) GORE-TEX STRETCH GRAFT INSERTION INTO LEFT ARM;  Surgeon: Richrd Char, MD;  Location: [facility] OR;   Service: Vascular;  Laterality: Left;   AV FISTULA PLACEMENT Right 01/16/2022   Procedure: RIGHT ARM ARTERIOVENOUS (AV) FISTULA;  Surgeon: Dannis Dy, MD;  Location: [facility] OR;  Service: Vascular;  Laterality: Right;   AV FISTULA PLACEMENT Right 09/01/2022   Procedure: INSERTION OF ARTERIOVENOUS (AV) GORE-TEX GRAFT RIGHT ARM;  Surgeon: Adine Hoof, MD;  Location: [facility] OR;  Service: Vascular;  Laterality: Right;   CATARACT EXTRACTION Bilateral    COLONOSCOPY     IR FLUORO GUIDE CV LINE RIGHT  01/09/2022   IR US  GUIDE VASC ACCESS RIGHT  01/09/2022   OTHER SURGICAL HISTORY     Catherer placed by [facility], right upper chest area.   PERIPHERAL VASCULAR BALLOON ANGIOPLASTY  05/11/2023   Procedure: PERIPHERAL VASCULAR BALLOON ANGIOPLASTY;  Surgeon: Melodie Spry, MD;  Location: [facility] INVASIVE CV LAB;  Service: Cardiovascular;;  Venous Anastomosis   PERIPHERAL VASCULAR BALLOON ANGIOPLASTY  05/30/2023   Procedure: PERIPHERAL VASCULAR BALLOON ANGIOPLASTY;  Surgeon: Patrick Boor, MD;  Location: [facility] INVASIVE CV LAB;  Service: Cardiovascular;;  Venous Anastomosis; Distal Intragraft   PERIPHERAL VASCULAR THROMBECTOMY N/A 05/30/2023   Procedure: PERIPHERAL VASCULAR THROMBECTOMY;  Surgeon: Patrick Boor, MD;  Location: [facility] INVASIVE CV LAB;  Service: Cardiovascular;  Laterality: N/A;   thigh surg     left side due to  MRSA   UPPER GASTROINTESTINAL ENDOSCOPY         Home Medications Prior to Admission medications   Medication Sig Start Date End Date Taking? Authorizing Provider  acetaminophen  (TYLENOL ) 500 MG tablet Take 1,000 mg by mouth every 8 (eight) hours as needed for headache.    [provider]  aspirin  81 MG chewable tablet Chew 1 tablet (81 mg total) by mouth daily. 05/12/17   Regalado, Belkys A, MD  B Complex-C-Zn-Folic Acid  (DIALYVITE 800 WITH ZINC ) 0.8 MG TABS Take 0.8 mg by mouth every evening. 07/01/21   [provider]  calcium  acetate (PHOSLO) 667 MG capsule Take  1,334 mg by mouth 3 (three) times daily with meals.    [provider]  Cinacalcet  HCl (SENSIPAR  PO) Take 1 capsule by mouth Every Tuesday,Thursday,and Saturday with dialysis.    [provider]  clopidogrel  (PLAVIX ) 75 MG tablet TAKE 1 TABLET BY MOUTH EVERY DAY 08/20/23   Eubanks, Jessica K, NP  ezetimibe  (ZETIA ) 10 MG tablet TAKE 1 TABLET BY MOUTH EVERY DAY 11/20/22   Eubanks, Jessica K, NP  latanoprost  (XALATAN ) 0.005 % ophthalmic solution Place 1 drop into both eyes at bedtime.    [provider]  pantoprazole  (PROTONIX ) 40 MG tablet TAKE 1 TABLET BY MOUTH EVERY DAY 04/26/23   Zehr, Jessica D, PA-C  pravastatin  (PRAVACHOL ) 40 MG tablet TAKE 1 TABLET BY MOUTH EVERYDAY AT BEDTIME 11/20/22   Eubanks, Jessica K, NP  sertraline  (ZOLOFT ) 50 MG tablet TAKE 1 TABLET BY MOUTH EVERY DAY 08/22/23   Eubanks, Jessica K, NP  traZODone  (DESYREL ) 100 MG tablet Take 1 tablet (100 mg total) by mouth at bedtime. 05/04/23   Eubanks, Jessica K, NP  triamcinolone  cream (KENALOG ) 0.1 % APPLY TOPICALLY 3 TIMES A DAY AS NEEDED FOR RASH 07/18/23   Eubanks, Jessica K, NP      Allergies    Lisinopril , Pioglitazone, Morphine and codeine, and Sulfonamide derivatives    Review of Systems   Review of Systems  Musculoskeletal:  Negative for myalgias.    Physical Exam Updated Vital Signs BP (!) 163/88   Pulse 73   Temp 98.2 F (36.8 C) (Oral)   Resp 16   Ht 5\' 5"  (1.651 m)   Wt 72 kg   SpO2 100%   BMI 26.41 kg/m  Physical Exam Vitals and nursing note reviewed.  Constitutional:      General: She is not in acute distress.    Appearance: She is well-developed.  HENT:     Head: Normocephalic and atraumatic.  Eyes:     Conjunctiva/sclera: Conjunctivae normal.  Cardiovascular:     Rate and Rhythm: Normal rate and regular rhythm.     Heart sounds: No murmur heard. Pulmonary:     Effort: Pulmonary effort is normal. No respiratory distress.     Breath sounds: Normal breath sounds.   Abdominal:     Palpations: Abdomen is soft.     Tenderness: There is no abdominal tenderness.  Musculoskeletal:        General: No swelling.     Cervical back: Neck supple.     Comments: Right upper extremity fistula site without any palpable thrill or bruit.  Tolerates full range of motion of the extremity.  There is no overlying erythema, warmth, induration or fluctuance.  Compartments are soft.  Radial pulse 2+  Skin:    General: Skin is warm and dry.     Capillary Refill: Capillary refill takes less than 2 seconds.  Neurological:     Mental Status: She is alert.  Psychiatric:        Mood and Affect: Mood normal.     ED Results / Procedures / Treatments   Labs (all labs ordered are listed, but only abnormal results are displayed) Labs Reviewed  CBC WITH DIFFERENTIAL/PLATELET - Abnormal; Notable for the following components:      Result Value   RBC 3.50 (*)    Hemoglobin 11.5 (*)    HCT 35.1 (*)    MCV 100.3 (*)    Eosinophils Absolute 0.6 (*)    All other components within normal limits  COMPREHENSIVE METABOLIC PANEL WITH GFR - Abnormal; Notable for the following components:   Chloride 96 (*)    Glucose, Bld 101 (*)    BUN 38 (*)    Creatinine, Ser 10.19 (*)    Calcium  8.8 (*)    GFR, Estimated 4 (*)    Anion gap 16 (*)    All other components within normal limits    EKG None  Radiology VAS US  DUPLEX DIALYSIS ACCESS (AVF, AVG) Result Date: 09/01/2023 DIALYSIS ACCESS Patient Name:  LESLYE CAMILLERI  Date of Exam:   09/01/2023 Medical Rec #: [id number]        Accession #:    [id number] Date of Birth: 06-30-1945        Patient Gender: F Patient Age:   70 years Exam Location:  [facility] Procedure:      VAS US  DUPLEX DIALYSIS ACCESS (AVF, AVG) Referring Phys: Russella Courts --------------------------------------------------------------------------------  Reason for Exam: Unable to dialyze through AVF/AVG, No palpable thrill for AVG. Access Site: Right Upper Extremity.  Access Type: Right arm cephalic vein AV fistula with interposition Artegraft. History: Thrombectomy of right AVG and angioplasty of 50% arterial anastomosis          stenosis treated with 7 X 4 Mustang 05/30/23 by Dr. 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 Clevester Dally          Revision of right arm cephalic vein AV fistula with interposition          Artegraft 09/01/22 by Dr. Vikki Graves          Right braciocephalic fistula in the upper arm 01/16/22 by Dr. Shaunna Delaware. Comparison Study: Prior duplex of right dialysis graft done 07/24/2022 Performing Technologist: Carleene Chase RVS  Examination Guidelines: A complete evaluation includes B-mode imaging, spectral Doppler, color Doppler, and power Doppler as needed of all accessible portions of each vessel. Unilateral testing is considered an integral part of a complete examination. Limited examinations for reoccurring indications may be performed as noted.    Summary: Arteriovenous graft-Thrombus noted. *See table(s) above for measurements and observations.    --------------------------------------------------------------------------------   Preliminary    ECHOCARDIOGRAM COMPLETE Result Date: 08/31/2023    ECHOCARDIOGRAM REPORT   Patient Name:   ABRIGAIL CORMAN Date of Exam: 08/31/2023 Medical Rec #:  [id number]       Height:       65.0 in Accession #:    [id number]      Weight:       170.0 lb Date of Birth:  Jun 23, 1945       BSA:          1.846 m Patient Age:    47 years  BP:           -/- mmHg Patient Gender: F               HR:           83 bpm. Exam Location:  [address] Procedure: 2D Echo, 3D Echo and Strain Analysis (Both Spectral and Color Flow            Doppler were utilized during procedure). Indications:    I34.0 Nonrheumatic mitral (valve) insufficiency  History:        Patient has prior history of Echocardiogram examinations, most                 recent  08/11/2022. Risk Factors:Hypertension, Diabetes,                 Dyslipidemia and Current Smoker. ESRD. Chronic kidney disease.  Sonographer:    Mylinda Asa RCS Referring Phys: [id number] CHRISTOPHER D MCALHANY IMPRESSIONS  1. Proximal septal thickening with narrow LVOT; peak velocity of 2.6 m/s with valsalva.  2. Left ventricular ejection fraction, by estimation, is 70 to 75%. The left ventricle has hyperdynamic function. The left ventricle has no regional wall motion abnormalities. There is moderate left ventricular hypertrophy. Left ventricular diastolic parameters are consistent with Grade I diastolic dysfunction (impaired relaxation). Elevated left atrial pressure. The average left ventricular global longitudinal strain is -16.3 %. The global longitudinal strain is abnormal.  3. Right ventricular systolic function is normal. The right ventricular size is normal.  4. Left atrial size was mildly dilated.  5. The mitral valve is normal in structure. Mild mitral valve regurgitation. No evidence of mitral stenosis.  6. The aortic valve is tricuspid. Aortic valve regurgitation is not visualized. No aortic stenosis is present.  7. The inferior vena cava is normal in size with greater than 50% respiratory variability, suggesting right atrial pressure of 3 mmHg. FINDINGS  Left Ventricle: Left ventricular ejection fraction, by estimation, is 70 to 75%. The left ventricle has hyperdynamic function. The left ventricle has no regional wall motion abnormalities. The average left ventricular global longitudinal strain is -16.3  %. Strain was performed and the global longitudinal strain is abnormal. The left ventricular internal cavity size was normal in size. There is moderate left ventricular hypertrophy. Left ventricular diastolic parameters are consistent with Grade I diastolic dysfunction (impaired relaxation). Elevated left atrial pressure. Right Ventricle: The right ventricular size is normal. Right ventricular systolic  function is normal. Left Atrium: Left atrial size was mildly dilated. Right Atrium: Right atrial size was normal in size. Pericardium: There is no evidence of pericardial effusion. Mitral Valve: The mitral valve is normal in structure. Mild mitral annular calcification. Mild mitral valve regurgitation. No evidence of mitral valve stenosis. Tricuspid Valve: The tricuspid valve is normal in structure. Tricuspid valve regurgitation is mild . No evidence of tricuspid stenosis. Aortic Valve: The aortic valve is tricuspid. Aortic valve regurgitation is not visualized. No aortic stenosis is present. Pulmonic Valve: The pulmonic valve was normal in structure. Pulmonic valve regurgitation is trivial. No evidence of pulmonic stenosis. Aorta: The aortic root is normal in size and structure. Venous: The inferior vena cava is normal in size with greater than 50% respiratory variability, suggesting right atrial pressure of 3 mmHg. IAS/Shunts: No atrial level shunt detected by color flow Doppler. Additional Comments: Proximal septal thickening with narrow LVOT; peak velocity of 2.6 m/s with valsalva. 3D was performed not requiring image post processing on an independent workstation and was  indeterminate.  LEFT VENTRICLE PLAX 2D LVIDd:         3.40 cm   Diastology LVIDs:         2.71 cm   LV e' medial:    4.24 cm/s LV PW:         1.51 cm   LV E/e' medial:  16.3 LV IVS:        1.27 cm   LV e' lateral:   9.25 cm/s LVOT diam:     2.20 cm   LV E/e' lateral: 7.5 LV SV:         66 LV SV Index:   36        2D Longitudinal Strain LVOT Area:     3.80 cm  2D Strain GLS (A4C):   -15.0 %                          2D Strain GLS (A3C):   -16.9 %                          2D Strain GLS (A2C):   -16.9 %                          2D Strain GLS Avg:     -16.3 %                           3D Volume EF:                          LV EDV:       156 ml                          LV ESV:       90 ml                          LV SV:        67 ml RIGHT VENTRICLE  RV Basal diam:  2.90 cm RV S prime:     12.10 cm/s TAPSE (M-mode): 1.7 cm LEFT ATRIUM             Index        RIGHT ATRIUM           Index LA diam:        1.50 cm 0.81 cm/m   RA Area:     13.80 cm LA Vol (A2C):   37.3 ml 20.20 ml/m  RA Volume:   32.80 ml  17.77 ml/m LA Vol (A4C):   67.9 ml 36.78 ml/m LA Biplane Vol: 49.8 ml 26.98 ml/m  AORTIC VALVE LVOT Vmax:   105.00 cm/s LVOT Vmean:  73.400 cm/s LVOT VTI:    0.173 m  AORTA Ao Root diam: 3.40 cm Ao Asc diam:  3.70 cm MITRAL VALVE MV Area (PHT): 2.09 cm     SHUNTS MV Decel Time: 363 msec     Systemic VTI:  0.17 m MV E velocity: 69.20 cm/s   Systemic Diam: 2.20 cm MV A velocity: 124.00 cm/s MV E/A ratio:  0.56 Alexandria Angel MD Electronically signed by Alexandria Angel MD Signature Date/Time: 08/31/2023/12:19:03 PM    Final     Procedures Procedures    Medications Ordered in ED  Medications - No data to display  ED Course/ Medical Decision Making/ A&P Clinical Course as of 09/01/23 1230  Sat Sep 01, 2023  1004 Spoke w/ Dr Zana Hesselbach, will come see [SG]    Clinical Course User Index [SG] Teddi Favors, DO                                 Medical Decision Making Amount and/or Complexity of Data Reviewed Labs: ordered.   This patient presents to the ED with chief complaint(s) of dialysis concern.  The complaint involves an extensive differential diagnosis and also carries with it a high risk of complications and morbidity.   Pertinent past medical history as listed in HPI  The differential diagnosis includes  DVT, cellulitis, abscess, steal syndrome Additional history obtained: Records reviewed Care Everywhere/External Records  Assessment and management:   Hemodynamically stable, nontoxic-appearing patient presenting with concern for her dialysis AVG. patient went to dialysis today and was told to come here as they were concerned that there was a blood clot given that there was no present thrill or bruit.  Neither of these are  appreciated today on my exam.  Patient is otherwise asymptomatic.  She has no pain to the arm.  There is no significant swelling or tenderness.  No erythema, warmth, induration or fluctuance.  Do not suspect cellulitis or an abscess.  Does appear that she had a successful thrombectomy of right upper extremity AVG with evidence of 50% outflow venous anastomosis stenosis which was corrected with angioplasty 05/30/2023 with Dr. Aloysius Janus.  Independent ECG interpretation:  Sinus rhythm, abnormal R wave progression, appears unchanged  Independent labs interpretation:  The following labs were independently interpreted:  CMP of creatinine 10.19, anion gap 16, CBC without significant abnormality,   Independent visualization and interpretation of imaging: I independently visualized the following imaging with scope of interpretation limited to determining acute life threatening conditions related to emergency care:  Vascular US  duplex with occluded graft   Consultations obtained:   IR Dr. Nereida Banning recommended consulting nephrology Nephrology Dr. Zana Hesselbach will come evaluate - Is unable to get patient in on Monday but does not feel that the patient needs to be admitted for this.  Recommended discussing with IR whether they can get her on Monday Discussed with IR, plan will be for tunneled catheter placement and assessment of the fistula this coming Monday. McInnis PA-C, from IR came and explained these things to the patient.  She is understanding and agreeable for discharge plan.   Disposition:   Patient will be discharged home. The patient has been appropriately medically screened and/or stabilized in the ED. I have low suspicion for any other emergent medical condition which would require further screening, evaluation or treatment in the ED or require inpatient management. At time of discharge the patient is hemodynamically stable and in no acute distress. I have discussed work-up results and diagnosis with  patient and answered all questions. Patient is agreeable with discharge plan. We discussed strict return precautions for returning to the emergency department and they verbalized understanding.     Social Determinants of Health:   none  This note was dictated with voice recognition software.  Despite best efforts at proofreading, errors may have occurred which can change the documentation meaning.          Final Clinical Impression(s) / ED Diagnoses Final diagnoses:  Complication of arteriovenous dialysis fistula, initial encounter  Rx / DC Orders ED Discharge Orders     None         Stanton Earthly 09/01/23 1316    Eldon Greenland, MD 09/04/23 1032

## 2023-09-01 NOTE — ED Triage Notes (Signed)
 Pt reports Tues, Thursday, Saturday dialysis patient. Went for normally scheduled treatment this morning and was unable to complete because they were unable to find bruis or thrill. Denies pain/fever. Last full treatment Thursday. Denies SOB. VSS.

## 2023-09-01 NOTE — ED Notes (Signed)
Went to vascular 

## 2023-09-01 NOTE — Discharge Instructions (Addendum)
 You were evaluated in the emergency room for concern for your fistula site.  This appears to be occluded.  You have an appointment on Monday to address this.

## 2023-09-03 ENCOUNTER — Emergency Department (HOSPITAL_COMMUNITY)
Admission: RE | Admit: 2023-09-03 | Discharge: 2023-09-03 | Disposition: A | Discharge status: Home / Self Care | Source: Ambulatory Visit | Attending: Interventional Radiology | Admitting: Interventional Radiology

## 2023-09-03 ENCOUNTER — Emergency Department (HOSPITAL_COMMUNITY)
Admission: EM | Admit: 2023-09-03 | Discharge: 2023-09-03 | Discharge status: Against Medical Advice | Attending: Interventional Radiology | Admitting: Interventional Radiology

## 2023-09-03 ENCOUNTER — Other Ambulatory Visit (HOSPITAL_COMMUNITY): Payer: Self-pay | Admitting: Interventional Radiology

## 2023-09-03 ENCOUNTER — Encounter (HOSPITAL_COMMUNITY): Payer: Self-pay

## 2023-09-03 ENCOUNTER — Inpatient Hospital Stay (HOSPITAL_COMMUNITY)
Admit: 2023-09-03 | Discharge: 2023-09-03 | Disposition: A | Discharge status: Home / Self Care | Attending: Interventional Radiology | Admitting: Interventional Radiology

## 2023-09-03 ENCOUNTER — Other Ambulatory Visit: Payer: Self-pay

## 2023-09-03 DIAGNOSIS — Z992 Dependence on renal dialysis: Secondary | ICD-10-CM | POA: Diagnosis not present

## 2023-09-03 DIAGNOSIS — J45909 Unspecified asthma, uncomplicated: Secondary | ICD-10-CM | POA: Insufficient documentation

## 2023-09-03 DIAGNOSIS — E1122 Type 2 diabetes mellitus with diabetic chronic kidney disease: Secondary | ICD-10-CM | POA: Diagnosis not present

## 2023-09-03 DIAGNOSIS — F419 Anxiety disorder, unspecified: Secondary | ICD-10-CM | POA: Insufficient documentation

## 2023-09-03 DIAGNOSIS — Y832 Surgical operation with anastomosis, bypass or graft as the cause of abnormal reaction of the patient, or of later complication, without mention of misadventure at the time of the procedure: Secondary | ICD-10-CM | POA: Insufficient documentation

## 2023-09-03 DIAGNOSIS — N186 End stage renal disease: Secondary | ICD-10-CM

## 2023-09-03 DIAGNOSIS — T82868S Thrombosis of vascular prosthetic devices, implants and grafts, sequela: Secondary | ICD-10-CM

## 2023-09-03 DIAGNOSIS — M1990 Unspecified osteoarthritis, unspecified site: Secondary | ICD-10-CM | POA: Diagnosis not present

## 2023-09-03 DIAGNOSIS — D649 Anemia, unspecified: Secondary | ICD-10-CM | POA: Diagnosis not present

## 2023-09-03 DIAGNOSIS — T82868A Thrombosis of vascular prosthetic devices, implants and grafts, initial encounter: Secondary | ICD-10-CM | POA: Diagnosis present

## 2023-09-03 DIAGNOSIS — I120 Hypertensive chronic kidney disease with stage 5 chronic kidney disease or end stage renal disease: Secondary | ICD-10-CM | POA: Diagnosis not present

## 2023-09-03 DIAGNOSIS — E785 Hyperlipidemia, unspecified: Secondary | ICD-10-CM | POA: Diagnosis not present

## 2023-09-03 HISTORY — PX: IR FLUORO GUIDE CV LINE RIGHT: IMG2283

## 2023-09-03 HISTORY — PX: IR US GUIDE VASC ACCESS RIGHT: IMG2390

## 2023-09-03 LAB — GLUCOSE, CAPILLARY: Glucose-Capillary: 84 mg/dL (ref 70–99)

## 2023-09-03 MED ORDER — CEFAZOLIN SODIUM-DEXTROSE 2-4 GM/100ML-% IV SOLN: 2.0000 g | Freq: Once | INTRAVENOUS | Status: DC

## 2023-09-03 MED ORDER — CEFAZOLIN SODIUM-DEXTROSE 2-4 GM/100ML-% IV SOLN: 2.0000 g | INTRAVENOUS | Status: DC

## 2023-09-03 MED ORDER — FENTANYL CITRATE (PF) 100 MCG/2ML IJ SOLN
INTRAMUSCULAR | Status: AC
  Filled 2023-09-03: qty 2

## 2023-09-03 MED ORDER — MIDAZOLAM HCL 2 MG/2ML IJ SOLN
INTRAMUSCULAR | Status: AC | PRN
Start: 2023-09-03 — End: 2023-09-03
  Administered 2023-09-03: .5 mg via INTRAVENOUS
  Administered 2023-09-03: 1 mg via INTRAVENOUS
  Administered 2023-09-03: .5 mg via INTRAVENOUS

## 2023-09-03 MED ORDER — CEFAZOLIN SODIUM-DEXTROSE 2-4 GM/100ML-% IV SOLN
INTRAVENOUS | Status: AC | PRN
  Administered 2023-09-03: 2 g via INTRAVENOUS

## 2023-09-03 MED ORDER — MIDAZOLAM HCL 2 MG/2ML IJ SOLN
INTRAMUSCULAR | Status: AC
  Filled 2023-09-03: qty 2

## 2023-09-03 MED ORDER — CEFAZOLIN SODIUM-DEXTROSE 2-4 GM/100ML-% IV SOLN
INTRAVENOUS | Status: AC
  Filled 2023-09-03: qty 100

## 2023-09-03 MED ORDER — LIDOCAINE-EPINEPHRINE 1 %-1:100000 IJ SOLN
INTRAMUSCULAR | Status: AC
  Filled 2023-09-03: qty 1

## 2023-09-03 MED ORDER — HEPARIN SODIUM (PORCINE) 1000 UNIT/ML IJ SOLN
INTRAMUSCULAR | Status: AC
Start: 2023-09-03 — End: ?
  Filled 2023-09-03: qty 10

## 2023-09-03 MED ORDER — LIDOCAINE-EPINEPHRINE 1 %-1:100000 IJ SOLN
20.0000 mL | Freq: Once | INTRAMUSCULAR | Status: AC
  Administered 2023-09-03: 10 mL via INTRADERMAL

## 2023-09-03 NOTE — ED Notes (Signed)
 This NT called pt in lobby x3 for triage. No response. Triage RN notified.

## 2023-09-03 NOTE — Procedures (Signed)
Interventional Radiology Procedure Note  Procedure: RT internal jugular TUNNELED HD CATH    Complications: None  Estimated Blood Loss:  MIN  Findings: TIP SVCRA    Sharen Counter, MD

## 2023-09-03 NOTE — H&P (Signed)
 Chief Complaint: Thrombosed AV fistula  Referring Provider(s): Paul Boring, PA-C  Supervising Physician: Alyssa Jumper  Patient Status: [facility] - Out-pt  History of Present Illness: [name] is a 78 y.o. female with ESRD on HD (Tu, Thurs, Sat), diabetes, hypertension, hyperlipidemia, anxiety, arthritis, anemia, and asthma.  She presented to the ED on 09/01/23 with concerns her AV graft was thrombosed.  Last completed session was Thursday.   She is here today for placement of a tunneled hemodialysis catheter.  She last ate at 10 am. No nausea/vomiting. No Fever/chills. ROS negative.   Patient is Full Code  Past Medical History:  Diagnosis Date   Anemia    Anxiety    Arthritis    lower back and knees    Asthma    childhood   Barrett's esophagus    CAD (coronary artery disease)    Chronic kidney disease    Coagulation defect (HCC)    Depression    Diabetes mellitus    Dysphagia    Eczema    End stage renal disease (HCC)    Fluid overload, unspecified    History of herpes zoster 02/2010   Recovered fully after period of acute herpetic neuralgia tx w/ gabapentin .    Hx MRSA infection 2009   Hypercalcemia    Hyperlipidemia    Hypertension    Mitral regurgitation    moderate-severe MR 08/11/22 echo   Moderate protein-calorie malnutrition (HCC)    Neuromuscular disorder (HCC)    chronic pain   Personal history of colonic polyps 10/17/2010   hyperplastic   Stroke [facility])     Past Surgical History:  Procedure Laterality Date   A/V FISTULAGRAM N/A 05/11/2023   Procedure: A/V Fistulagram;  Surgeon: Melodie Spry, MD;  Location: [facility] INVASIVE CV LAB;  Service: Cardiovascular;  Laterality: N/A;   ABDOMINAL HYSTERECTOMY     unclear when   AV FISTULA PLACEMENT Left 02/13/2017   Procedure: ARTERIOVENOUS (AV) GORE-TEX STRETCH GRAFT INSERTION INTO LEFT ARM;  Surgeon: Richrd Char, MD;  Location: [facility] OR;  Service: Vascular;  Laterality: Left;   AV FISTULA  PLACEMENT Right 01/16/2022   Procedure: RIGHT ARM ARTERIOVENOUS (AV) FISTULA;  Surgeon: Dannis Dy, MD;  Location: [facility] OR;  Service: Vascular;  Laterality: Right;   AV FISTULA PLACEMENT Right 09/01/2022   Procedure: INSERTION OF ARTERIOVENOUS (AV) GORE-TEX GRAFT RIGHT ARM;  Surgeon: Adine Hoof, MD;  Location: [facility] OR;  Service: Vascular;  Laterality: Right;   CATARACT EXTRACTION Bilateral    COLONOSCOPY     IR FLUORO GUIDE CV LINE RIGHT  01/09/2022   IR US  GUIDE VASC ACCESS RIGHT  01/09/2022   OTHER SURGICAL HISTORY     Catherer placed by [facility], right upper chest area.   PERIPHERAL VASCULAR BALLOON ANGIOPLASTY  05/11/2023   Procedure: PERIPHERAL VASCULAR BALLOON ANGIOPLASTY;  Surgeon: Melodie Spry, MD;  Location: [facility] INVASIVE CV LAB;  Service: Cardiovascular;;  Venous Anastomosis   PERIPHERAL VASCULAR BALLOON ANGIOPLASTY  05/30/2023   Procedure: PERIPHERAL VASCULAR BALLOON ANGIOPLASTY;  Surgeon: Patrick Boor, MD;  Location: [facility] INVASIVE CV LAB;  Service: Cardiovascular;;  Venous Anastomosis; Distal Intragraft   PERIPHERAL VASCULAR THROMBECTOMY N/A 05/30/2023   Procedure: PERIPHERAL VASCULAR THROMBECTOMY;  Surgeon: Patrick Boor, MD;  Location: [facility] INVASIVE CV LAB;  Service: Cardiovascular;  Laterality: N/A;   thigh surg     left side due to MRSA   UPPER GASTROINTESTINAL ENDOSCOPY      Allergies: Lisinopril ,  Pioglitazone, Morphine and codeine, and Sulfonamide derivatives  Medications: Prior to Admission medications   Medication Sig Start Date End Date Taking? Authorizing Provider  acetaminophen  (TYLENOL ) 500 MG tablet Take 1,000 mg by mouth every 8 (eight) hours as needed for headache.    [provider]  aspirin  81 MG chewable tablet Chew 1 tablet (81 mg total) by mouth daily. 05/12/17   Regalado, Belkys A, MD  B Complex-C-Zn-Folic Acid  (DIALYVITE 800 WITH ZINC ) 0.8 MG TABS Take 0.8 mg by mouth every evening. 07/01/21   [provider]  calcium  acetate  (PHOSLO) 667 MG capsule Take 1,334 mg by mouth 3 (three) times daily with meals.    [provider]  Cinacalcet  HCl (SENSIPAR  PO) Take 1 capsule by mouth Every Tuesday,Thursday,and Saturday with dialysis.    [provider]  clopidogrel  (PLAVIX ) 75 MG tablet TAKE 1 TABLET BY MOUTH EVERY DAY 08/20/23   Eubanks, Jessica K, NP  ezetimibe  (ZETIA ) 10 MG tablet TAKE 1 TABLET BY MOUTH EVERY DAY 11/20/22   Eubanks, Jessica K, NP  latanoprost  (XALATAN ) 0.005 % ophthalmic solution Place 1 drop into both eyes at bedtime.    [provider]  pantoprazole  (PROTONIX ) 40 MG tablet TAKE 1 TABLET BY MOUTH EVERY DAY 04/26/23   Zehr, Jessica D, PA-C  pravastatin  (PRAVACHOL ) 40 MG tablet TAKE 1 TABLET BY MOUTH EVERYDAY AT BEDTIME 11/20/22   Eubanks, Jessica K, NP  sertraline  (ZOLOFT ) 50 MG tablet TAKE 1 TABLET BY MOUTH EVERY DAY 08/22/23   Eubanks, Jessica K, NP  traZODone  (DESYREL ) 100 MG tablet Take 1 tablet (100 mg total) by mouth at bedtime. 05/04/23   Verma Gobble, NP  triamcinolone  cream (KENALOG ) 0.1 % APPLY TOPICALLY 3 TIMES A DAY AS NEEDED FOR RASH 07/18/23   Verma Gobble, NP     Family History  Problem Relation Age of Onset   Hypertension Father    Kidney disease Father    Diabetes Brother    Colon cancer Neg Hx    CAD Neg Hx    Rectal cancer Neg Hx    Stomach cancer Neg Hx     Social History   Socioeconomic History   Marital status: Divorced    Spouse name: Not on file   Number of children: 0   Years of education: Not on file   Highest education level: Not on file  Occupational History   Occupation: Retired    Associate Professor: SELF-EMPLOYED  Tobacco Use   Smoking status: Every Day    Current packs/day: 0.50    Average packs/day: 0.5 packs/day for 40.0 years (20.0 ttl pk-yrs)    Types: Cigarettes    Passive exposure: Never   Smokeless tobacco: Never   Tobacco comments:    smokes 1 pack of cigerettes a week- off and on  Vaping Use   Vaping status: Never Used   Substance and Sexual Activity   Alcohol use: Yes    Alcohol/week: 2.0 standard drinks of alcohol    Types: 2 Standard drinks or equivalent per week    Comment: wine and beer- occasionally    Drug use: No   Sexual activity: Never  Other Topics Concern   Not on file  Social History Narrative   Financial assistance approved for 100% discount at [facility] and has [facility] card per [place]   02/10/2010      3 caffeine drinks daily    Social Drivers of Health   Financial Resource Strain: Low Risk  (01/30/2018)  Overall Financial Resource Strain (CARDIA)    Difficulty of Paying Living Expenses: Not hard at all  Food Insecurity: No Food Insecurity (01/30/2018)   Hunger Vital Sign    Worried About Running Out of Food in the Last Year: Never true    Ran Out of Food in the Last Year: Never true  Transportation Needs: No Transportation Needs (01/30/2018)   PRAPARE - Administrator, Civil Service (Medical): No    Lack of Transportation (Non-Medical): No  Physical Activity: Sufficiently Active (01/30/2018)   Exercise Vital Sign    Days of Exercise per Week: 7 days    Minutes of Exercise per Session: 30 min  Stress: Stress Concern Present (01/30/2018)   [employer] of Occupational Health - Occupational Stress Questionnaire    Feeling of Stress : To some extent  Social Connections: Moderately Isolated (01/30/2018)   Social Connection and Isolation Panel [NHANES]    Frequency of Communication with Friends and Family: More than three times a week    Frequency of Social Gatherings with Friends and Family: More than three times a week    Attends Religious Services: Never    Database administrator or Organizations: No    Attends Banker Meetings: Never    Marital Status: Divorced     Review of Systems: A 12 point ROS discussed and pertinent positives are indicated in the HPI above.  All other systems are negative.    Vital Signs: BP (!) 142/114   Pulse 77   Temp  97.8 F (36.6 C) (Oral)   Resp 17   Ht 5\' 5"  (1.651 m)   Wt 177 lb (80.3 kg)   SpO2 100%   BMI 29.45 kg/m   Advance Care Plan: The advanced care place/surrogate decision maker was discussed at the time of visit and the patient did not wish to discuss or was not able to name a surrogate decision maker or provide an advance care plan.  Physical Exam Vitals reviewed.  Constitutional:      Appearance: Normal appearance.  HENT:     Head: Normocephalic and atraumatic.  Eyes:     Extraocular Movements: Extraocular movements intact.  Cardiovascular:     Rate and Rhythm: Normal rate and regular rhythm.  Pulmonary:     Effort: Pulmonary effort is normal. No respiratory distress.     Breath sounds: Normal breath sounds.  Abdominal:     Palpations: Abdomen is soft.  Musculoskeletal:        General: Normal range of motion.     Cervical back: Normal range of motion.     Comments: Thrombosed right AV Graft  Skin:    General: Skin is warm and dry.  Neurological:     General: No focal deficit present.     Mental Status: She is alert and oriented to person, place, and time.  Psychiatric:        Mood and Affect: Mood normal.        Behavior: Behavior normal.        Thought Content: Thought content normal.        Judgment: Judgment normal.     Imaging:  DIALYSIS ACCESS   Patient Name:  [name]  Date of Exam:   09/01/2023  Medical Rec #: [id number]        Accession #:    [id number]  Date of Birth: 05-06-1945        Patient Gender: F  Patient Age:  77 years  Exam Location:  [facility]  Procedure:      VAS US  DUPLEX DIALYSIS ACCESS (AVF, AVG)  Referring Phys: Russella Courts    ---------------------------------------------------------------------------  -----    Reason for Exam: Unable to dialyze through AVF/AVG, No palpable thrill for  AVG.   Access Site: Right Upper Extremity.   Access Type: Right arm cephalic vein AV fistula with interposition  Artegraft.    History: Thrombectomy of right AVG and angioplasty of 50% arterial  anastomosis          stenosis treated with 7 X 4 Mustang 05/30/23 by Dr. 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 Clevester Dally           Revision of right arm cephalic vein AV fistula with interposition           Artegraft 09/01/22 by Dr. Vikki Graves           Right braciocephalic fistula in the upper arm 01/16/22 by Dr.  Shaunna [place].   Comparison Study: Prior duplex of right dialysis graft done 07/24/2022   Performing Technologist: Carleene Chase RVS     Examination Guidelines: A complete evaluation includes B-mode imaging,  spectral  Doppler, color Doppler, and power Doppler as needed of all accessible  portions  of each vessel. Unilateral testing is considered an integral part of a  complete  examination. Limited examinations for reoccurring indications may be  performed  as noted.        Summary:  Arteriovenous graft-Thrombus noted.   *See table(s) above for measurements and observations.    Diagnosing physician: Angela Kell MD  Electronically signed by Angela Kell MD on 09/01/2023 at 8:05:30 PM.      Labs:  CBC: Recent Labs    09/01/23 0821  WBC 7.8  HGB 11.5*  HCT 35.1*  PLT 198    COAGS: No results for input(s): "INR", "APTT" in the last 8760 hours.  BMP: Recent Labs    09/01/23 0821  NA 141  K 4.4  CL 96*  CO2 29  GLUCOSE 101*  BUN 38*  CALCIUM  8.8*  CREATININE 10.19*  GFRNONAA 4*    LIVER FUNCTION TESTS: Recent Labs    09/01/23 0821  BILITOT 0.7  AST 24  ALT 23  ALKPHOS 93  PROT 6.8  ALBUMIN  3.8    TUMOR MARKERS: No results for input(s): "AFPTM", "CEA", "CA199", "CHROMGRNA" in the last 8760 hours.  Assessment and Plan:  ESRD - thrombosed AV Graft.  Will proceed with image guided placement of a tunneled hemodialysis catheter today by Dr. Lovell Rubenstein.  Risks and  benefits discussed with the patient including, but not limited to bleeding, infection, vascular injury, pneumothorax which may require chest tube placement, air embolism or even death  All of the patient's questions were answered, patient is agreeable to proceed. Consent signed and in chart.   Electronically Signed: Connor Deiters, PA-C   09/03/2023, 1:39 PM      I spent a total of  15 Minutes   in face to face in clinical consultation, greater than 50% of which was counseling/coordinating care for HD cath placement.

## 2023-09-04 LAB — GLUCOSE, CAPILLARY: Glucose-Capillary: 75 mg/dL (ref 70–99)

## 2023-09-05 ENCOUNTER — Ambulatory Visit: Payer: Self-pay | Admitting: *Deleted

## 2023-09-17 ENCOUNTER — Other Ambulatory Visit: Payer: Self-pay | Admitting: Nurse Practitioner

## 2023-09-17 ENCOUNTER — Other Ambulatory Visit: Payer: Self-pay | Admitting: Gastroenterology

## 2023-09-18 NOTE — Telephone Encounter (Signed)
 High risk or very high risk warning populated when attempting to refill medication. RX request sent to PCP for review and approval if warranted.

## 2023-09-23 ENCOUNTER — Other Ambulatory Visit: Payer: Self-pay | Admitting: Nurse Practitioner

## 2023-09-23 DIAGNOSIS — Z8673 Personal history of transient ischemic attack (TIA), and cerebral infarction without residual deficits: Secondary | ICD-10-CM

## 2023-09-23 DIAGNOSIS — G4700 Insomnia, unspecified: Secondary | ICD-10-CM

## 2023-09-23 DIAGNOSIS — I639 Cerebral infarction, unspecified: Secondary | ICD-10-CM

## 2023-09-24 NOTE — Telephone Encounter (Signed)
 High Risk Warning Populated when attempting to refill, I will send to Provider for further review

## 2023-09-24 NOTE — Telephone Encounter (Unsigned)
 Copied from CRM [id number]. Topic: Clinical - Medication Refill >> Sep 24, 2023 12:15 PM Retta Caster wrote: Medication: pantoprazole  (PROTONIX ) 40 MG tablet   Has the patient contacted their pharmacy? Yes (Agent: If no, request that the patient contact the pharmacy for the refill. If patient does not wish to contact the pharmacy document the reason why and proceed with request.) (Agent: If yes, when and what did the pharmacy advise?)  This is the patient's preferred pharmacy:  [id number] - [place], [place] - [address] [facility] [address] [place] [address] Phone: [phone] Fax: [phone]    Is this the correct pharmacy for this prescription? Yes If no, delete pharmacy and type the correct one.   Has the prescription been filled recently? Yes  Is the patient out of the medication? Yes  Has the patient been seen for an appointment in the last year OR does the patient have an upcoming appointment? Yes  Can we respond through MyChart? No  Agent: Please be advised that Rx refills may take up to 3 business days. We ask that you follow-up with your pharmacy.

## 2023-09-26 ENCOUNTER — Telehealth: Payer: Self-pay | Admitting: Nurse Practitioner

## 2023-09-26 NOTE — Telephone Encounter (Signed)
 Spoke with patient and informed her that Camilo Cella is out of office until next Monday. Patient would like a call once paperwork is complete for pick   Paperwork placed in Verma Gobble, NP review and sign folder.

## 2023-09-26 NOTE — Telephone Encounter (Signed)
 Patient brother? Came in dropped off a form for [employer] for to be completed and signed by provider. Given to Chrae in CI.Aaron AasAaron Aas

## 2023-10-05 NOTE — Telephone Encounter (Signed)
 The patient has been notified that it was faxed and it's ready for pickup.

## 2023-10-05 NOTE — Telephone Encounter (Signed)
Completed and given back to CI

## 2023-10-05 NOTE — Telephone Encounter (Signed)
 Papers given to front admin Alondra.M. She has faxed and placed papers up front for pick up.

## 2023-10-08 ENCOUNTER — Ambulatory Visit: Payer: Self-pay

## 2023-10-08 NOTE — Telephone Encounter (Unsigned)
 Copied from CRM [id number]. Topic: Clinical - Medical Advice >> Oct 08, 2023  1:05 PM [name] wrote: Reason for CRM: Patient is calling because she is wanting to see [facility] because she has a rash, she has talked to the provider about it once before and they wanted to send her a dermatologist and she doesn't want to see them, patient is wanting some medicine or something to help with this. Can you assist with this, patients callback number is [phone].

## 2023-10-08 NOTE — Telephone Encounter (Signed)
 She has appt with dermatologist on the 26th Also needs to schedule appt for routine follow up.

## 2023-10-08 NOTE — Telephone Encounter (Signed)
 FYI Only or Action Required?: Action required by provider  [name] was last seen in primary care on 05/04/2023 by Verma Gobble, NP. Called Nurse Triage reporting Rash. Symptoms began several weeks ago. Interventions attempted: OTC medications: benadryl . Symptoms are: gradually worsening.  Triage Disposition: See Physician Within 24 Hours  [name]/caregiver understands and will follow disposition?: No, wishes to speak with PCP: routed information to PCP office   Copied from CRM [id number]. Topic: Clinical - Medical Advice >> Oct 08, 2023  1:05 PM [name] wrote: Reason for CRM: [name] is calling because she is wanting to see [facility] because she has a rash, she has talked to the provider about it once before and they wanted to send her a dermatologist and she doesn't want to see them, [name] is wanting some medicine or something to help with this. Can you assist with this, patients callback number is [phone]. Reason for Disposition  SEVERE itching (i.e., interferes with sleep, normal activities or school)    Pt is requesting medication to be called in to pharmacy to help.  Answer Assessment - Initial Assessment Questions 1. APPEARANCE of RASH: Describe the rash. (e.g., spots, blisters, raised areas, skin peeling, scaly)     Raised bumps 2. SIZE: How big are the spots? (e.g., tip of pen, eraser, coin; inches, centimeters)     Various sizes 3. LOCATION: Where is the rash located?     Back of hands, bilateral arms 4. COLOR: What color is the rash? (Note: It is difficult to assess rash color in people with darker-colored skin. When this situation occurs, simply ask the caller to describe what they see.)     N/a 5. ONSET: When did the rash begin?     Ongoing  6. FEVER: Do you have a fever? If Yes, ask: What is your temperature, how was it measured, and when did it start?     no 7. ITCHING: Does the rash itch? If Yes, ask: How bad is the itch? (Scale 1-10; or  mild, moderate, severe)     Moderate to severe 8. CAUSE: What do you think is causing the rash?     unknown 9. MEDICINE FACTORS: Have you started any new medicines within the last 2 weeks? (e.g., antibiotics)      no 10. OTHER SYMPTOMS: Do you have any other symptoms? (e.g., dizziness, headache, sore throat, joint pain)       no 11. PREGNANCY: Is there any chance you are pregnant? When was your last menstrual period?       N/a  Pt does not want to wait for dermatologist b/c they can not see the pt for a long time. Pt states the itching bad & pt would like PCP to prescribe medication to help with rash & itching.  Protocols used: Rash or Redness - [facility]

## 2023-10-09 ENCOUNTER — Other Ambulatory Visit: Payer: Self-pay | Admitting: Nurse Practitioner

## 2023-10-09 DIAGNOSIS — L309 Dermatitis, unspecified: Secondary | ICD-10-CM

## 2023-10-09 NOTE — Telephone Encounter (Signed)
 Patient is requesting additonal refill on rash cream Triamcinolone . Medication pend and sent to PCP Roselie Conger Champ Coma, NP

## 2023-10-09 NOTE — Telephone Encounter (Signed)
 Copied from CRM [id number]. Topic: Clinical - Medication Refill >> Oct 09, 2023 12:42 PM [name] F wrote: Patient is calling to have the following prescription filled after several attempt; Patient is experiencing extreme itchiness and has had no relief; Patient has been waiting on a call back but hasn't received it.  Callback Number: [phone]    Medication: triamcinolone  cream (KENALOG ) 0.1 %  Has the patient contacted their pharmacy? Yes  This is the patient's preferred pharmacy:  [id number] - [place], [place] - [address] [facility] [address] [place] [address] Phone: [phone] Fax: [phone]   Is this the correct pharmacy for this prescription? Yes   Has the prescription been filled recently? No  Is the patient out of the medication? Yes  Has the patient been seen for an appointment in the last year OR does the patient have an upcoming appointment? Yes  Can we respond through MyChart? No  Agent: Please be advised that Rx refills may take up to 3 business days. We ask that you follow-up with your pharmacy.

## 2023-10-10 ENCOUNTER — Telehealth: Payer: Self-pay

## 2023-10-10 MED ORDER — TRIAMCINOLONE ACETONIDE 0.1 % EX CREA: TOPICAL_CREAM | Freq: Three times a day (TID) | CUTANEOUS | 0 refills | Status: DC | PRN

## 2023-10-10 NOTE — Telephone Encounter (Signed)
 Call returned to patient and I reassured her as I did before she left the office today (stopped by, see refill encounter dated 10/09/23) that the cream was sent to the pharmacy (it was sent before she left the office).  Patient states she would like to make sure Verma Gobble, NP is aware that due to dialysis she had to reschedule Dermatology appointment (first available Jan 2026), as she can not miss dialysis. Patient states that would mean that until she is seen she is going to need Camilo Cella to prescribe cream

## 2023-10-10 NOTE — Telephone Encounter (Signed)
 Patient walked in stating she went to the pharmacy and they do not have her refill yet. I spoke with PCP and she ok'd for medication to be filled and emphasized the importance of keeping upcoming dermatology appointment for next week.   I shared the above with patient and she was unaware of an upcoming dermatology appointment and when I shared the date and time, patient stated she has dialysis on Thursday morning and that appointment would need to be rescheduled to the pm. Patient left office to go pick up rx.  I called Dermatology on behalf of patient and was told, unfortunately they do not have anything for the pm and it would be 8 months out if patient needed to reschedule, I asked that they call patient to further discuss.

## 2023-10-10 NOTE — Addendum Note (Signed)
 Encounter addended by: Karolynn Pack on: 10/10/2023 3:12 PM  Actions taken: Imaging Exam ended

## 2023-10-10 NOTE — Telephone Encounter (Signed)
 Carla please let patient know if you are able to get her in sooner at another location

## 2023-10-10 NOTE — Telephone Encounter (Signed)
 Copied from CRM [id number]. Topic: Referral - Question >> Oct 10, 2023  9:13 AM Madelyne Schiff wrote: Reason for CRM: gave patient number and connected to [facility] (Dermatology)  [phone] >> Oct 10, 2023  9:28 AM Retta Caster wrote: Patient needs cream or medication to help with the itching and rash she is going thru. Also if the office could help with issue for scheduling with Dermatologist sue ot no app till Jan 2026> Needs call back on update for medication and issue with scheduling. [phone]

## 2023-10-10 NOTE — Telephone Encounter (Signed)
 This cream can not be prescribed long term without more investigate.  See if we can work with the referral coordinator to get her a sooner appt.

## 2023-10-10 NOTE — Telephone Encounter (Signed)
 E[employer] agent called to express how patient was rude and asked that we let leadership know (patient was not on the phone at the time of call)  Patient had spoken with E[employer] agent and told her that she is going to give us  until 4 pm to do our job and her referral to [facility].  I called [facility] and confirmed that referral was received, I then called patient, informed her of my conversation with [facility] and she verbalized understanding that she can expect a call to schedule

## 2023-10-10 NOTE — Telephone Encounter (Signed)
 Spoke to patient and let her know I sent her referral to [facility] in [place]. I provided the phone numbers to call.

## 2023-10-12 NOTE — Telephone Encounter (Signed)
 This encounter was created in error - please disregard.

## 2023-10-15 ENCOUNTER — Ambulatory Visit (INDEPENDENT_AMBULATORY_CARE_PROVIDER_SITE_OTHER): Admitting: Adult Health

## 2023-10-15 ENCOUNTER — Encounter: Payer: Self-pay | Admitting: Adult Health

## 2023-10-15 VITALS — BP 124/68 | HR 87 | Temp 97.6°F | Resp 18 | Ht 65.0 in | Wt 172.0 lb

## 2023-10-15 DIAGNOSIS — I10 Essential (primary) hypertension: Secondary | ICD-10-CM

## 2023-10-15 DIAGNOSIS — F339 Major depressive disorder, recurrent, unspecified: Secondary | ICD-10-CM | POA: Diagnosis not present

## 2023-10-15 DIAGNOSIS — K219 Gastro-esophageal reflux disease without esophagitis: Secondary | ICD-10-CM

## 2023-10-15 DIAGNOSIS — R21 Rash and other nonspecific skin eruption: Secondary | ICD-10-CM

## 2023-10-15 DIAGNOSIS — I25119 Atherosclerotic heart disease of native coronary artery with unspecified angina pectoris: Secondary | ICD-10-CM

## 2023-10-15 DIAGNOSIS — E785 Hyperlipidemia, unspecified: Secondary | ICD-10-CM | POA: Diagnosis not present

## 2023-10-15 DIAGNOSIS — N186 End stage renal disease: Secondary | ICD-10-CM

## 2023-10-15 DIAGNOSIS — Z992 Dependence on renal dialysis: Secondary | ICD-10-CM

## 2023-10-15 MED ORDER — SARNA 0.5-0.5 % EX LOTN: 1.0000 | TOPICAL_LOTION | CUTANEOUS | Status: AC | PRN

## 2023-10-15 NOTE — Progress Notes (Unsigned)
 [facility] clinic  Provider:  Jereld Serum DNP  Code Status: ***  Goals of Care:     09/03/2023   12:58 PM  Advanced Directives  Does Patient Have a Medical Advance Directive? No  Would patient like information on creating a medical advance directive? No - Patient declined     Chief Complaint  Patient presents with   Rash    rash    HPI: Patient is a 78 y.o. female seen today for an acute visit for Discussed the use of AI scribe software for clinical note transcription with the patient, who gave verbal consent to proceed.  History of Present Illness      Past Medical History:  Diagnosis Date   Anemia    Anxiety    Arthritis    lower back and knees    Asthma    childhood   Barrett's esophagus    CAD (coronary artery disease)    Chronic kidney disease    Coagulation defect (HCC)    Depression    Diabetes mellitus    Dysphagia    Eczema    End stage renal disease (HCC)    Fluid overload, unspecified    History of herpes zoster 02/2010   Recovered fully after period of acute herpetic neuralgia tx w/ gabapentin .    Hx MRSA infection 2009   Hypercalcemia    Hyperlipidemia    Hypertension    Mitral regurgitation    moderate-severe MR 08/11/22 echo   Moderate protein-calorie malnutrition (HCC)    Neuromuscular disorder (HCC)    chronic pain   Personal history of colonic polyps 10/17/2010   hyperplastic   Stroke [facility])     Past Surgical History:  Procedure Laterality Date   A/V FISTULAGRAM N/A 05/11/2023   Procedure: A/V Fistulagram;  Surgeon: [name], MD;  Location: [facility] INVASIVE CV LAB;  Service: Cardiovascular;  Laterality: N/A;   ABDOMINAL HYSTERECTOMY     unclear when   AV FISTULA PLACEMENT Left 02/13/2017   Procedure: ARTERIOVENOUS (AV) GORE-TEX STRETCH GRAFT INSERTION INTO LEFT ARM;  Surgeon: Harvey Carlin BRAVO, MD;  Location: [facility] OR;  Service: Vascular;  Laterality: Left;   AV FISTULA PLACEMENT Right 01/16/2022   Procedure: RIGHT ARM  ARTERIOVENOUS (AV) FISTULA;  Surgeon: [name], MD;  Location: [facility] OR;  Service: Vascular;  Laterality: Right;   AV FISTULA PLACEMENT Right 09/01/2022   Procedure: INSERTION OF ARTERIOVENOUS (AV) GORE-TEX GRAFT RIGHT ARM;  Surgeon: Sheree Penne Lonni, MD;  Location: [facility] OR;  Service: Vascular;  Laterality: Right;   CATARACT EXTRACTION Bilateral    COLONOSCOPY     IR FLUORO GUIDE CV LINE RIGHT  01/09/2022   IR FLUORO GUIDE CV LINE RIGHT  09/03/2023   IR US  GUIDE VASC ACCESS RIGHT  01/09/2022   IR US  GUIDE VASC ACCESS RIGHT  09/03/2023   OTHER SURGICAL HISTORY     Catherer placed by [facility], right upper chest area.   PERIPHERAL VASCULAR BALLOON ANGIOPLASTY  05/11/2023   Procedure: PERIPHERAL VASCULAR BALLOON ANGIOPLASTY;  Surgeon: [name], MD;  Location: [facility] INVASIVE CV LAB;  Service: Cardiovascular;;  Venous Anastomosis   PERIPHERAL VASCULAR BALLOON ANGIOPLASTY  05/30/2023   Procedure: PERIPHERAL VASCULAR BALLOON ANGIOPLASTY;  Surgeon: [name], MD;  Location: [facility] INVASIVE CV LAB;  Service: Cardiovascular;;  Venous Anastomosis; Distal Intragraft   PERIPHERAL VASCULAR THROMBECTOMY N/A 05/30/2023   Procedure: PERIPHERAL VASCULAR THROMBECTOMY;  Surgeon: [name], MD;  Location: [facility] INVASIVE CV LAB;  Service: Cardiovascular;  Laterality: N/A;   thigh surg     left side due to MRSA   UPPER GASTROINTESTINAL ENDOSCOPY      Allergies  Allergen Reactions   Lisinopril  Other (See Comments)    Abnormal Kidney Function    Pioglitazone Other (See Comments)    REACTION: Desquamation of skin of the palm   Morphine And Codeine Nausea And Vomiting   Sulfonamide Derivatives Rash    Outpatient Encounter Medications as of 10/15/2023  Medication Sig   acetaminophen  (TYLENOL ) 500 MG tablet Take 1,000 mg by mouth every 8 (eight) hours as needed for headache.   aspirin  81 MG chewable tablet Chew 1 tablet (81 mg total) by mouth daily.   B Complex-C-Zn-Folic Acid  (DIALYVITE 800 WITH  ZINC ) 0.8 MG TABS Take 0.8 mg by mouth every evening.   calcium  acetate (PHOSLO) 667 MG capsule Take 1,334 mg by mouth 3 (three) times daily with meals.   Cinacalcet  HCl (SENSIPAR  PO) Take 1 capsule by mouth Every Tuesday,Thursday,and Saturday with dialysis.   clopidogrel  (PLAVIX ) 75 MG tablet TAKE 1 TABLET BY MOUTH EVERY DAY   ezetimibe  (ZETIA ) 10 MG tablet TAKE 1 TABLET BY MOUTH EVERY DAY   latanoprost  (XALATAN ) 0.005 % ophthalmic solution Place 1 drop into both eyes at bedtime.   pantoprazole  (PROTONIX ) 40 MG tablet TAKE 1 TABLET BY MOUTH EVERY DAY   pravastatin  (PRAVACHOL ) 40 MG tablet TAKE 1 TABLET BY MOUTH EVERYDAY AT BEDTIME   sertraline  (ZOLOFT ) 50 MG tablet TAKE 1 TABLET BY MOUTH EVERY DAY   traZODone  (DESYREL ) 100 MG tablet TAKE 1 TABLET BY MOUTH EVERYDAY AT BEDTIME   triamcinolone  cream (KENALOG ) 0.1 % Apply topically 3 (three) times daily as needed. L30.9   No facility-administered encounter medications on file as of 10/15/2023.    Review of Systems:  Review of Systems  Constitutional:  Negative for appetite change, chills, fatigue and fever.  HENT:  Negative for congestion, hearing loss, rhinorrhea and sore throat.   Eyes: Negative.   Respiratory:  Negative for cough, shortness of breath and wheezing.   Cardiovascular:  Negative for chest pain, palpitations and leg swelling.  Gastrointestinal:  Negative for abdominal pain, constipation, diarrhea, nausea and vomiting.  Genitourinary:  Negative for dysuria.  Musculoskeletal:  Negative for arthralgias, back pain and myalgias.  Skin:  Negative for color change, rash and wound.  Neurological:  Negative for dizziness, weakness and headaches.  Psychiatric/Behavioral:  Negative for behavioral problems. The patient is not nervous/anxious.     Health Maintenance  Topic Date Due   OPHTHALMOLOGY EXAM  02/01/2021   Lung Cancer Screening  04/09/2021   COVID-19 Vaccine (5 - 2024-25 season) 12/24/2022   LIPID PANEL  06/13/2023    HEMOGLOBIN A1C  08/23/2023   Medicare Annual Wellness (AWV)  10/23/2023   INFLUENZA VACCINE  11/23/2023   FOOT EXAM  02/05/2024   DTaP/Tdap/Td (3 - Td or Tdap) 04/07/2030   Pneumococcal Vaccine: 50+ Years  Completed   DEXA SCAN  Completed   Hepatitis C Screening  Completed   Zoster Vaccines- Shingrix  Completed   HPV VACCINES  Aged Out   Meningococcal B Vaccine  Aged Out   Colonoscopy  Discontinued    Physical Exam: Vitals:   10/15/23 1359  BP: 124/68  Pulse: 87  Resp: 18  Temp: 97.6 F (36.4 C)  SpO2: 97%  Weight: 172 lb (78 kg)  Height: 5' 5 (1.651 m)   Body mass index is 28.62 kg/m. Physical Exam Constitutional:  Appearance: Normal appearance.  HENT:     Head: Normocephalic and atraumatic.     Nose: Nose normal.     Mouth/Throat:     Mouth: Mucous membranes are moist.   Eyes:     Conjunctiva/sclera: Conjunctivae normal.    Cardiovascular:     Rate and Rhythm: Normal rate and regular rhythm.  Pulmonary:     Effort: Pulmonary effort is normal.     Breath sounds: Normal breath sounds.  Abdominal:     General: Bowel sounds are normal.     Palpations: Abdomen is soft.   Musculoskeletal:        General: Normal range of motion.     Cervical back: Normal range of motion.   Skin:    General: Skin is warm and dry.     Findings: Rash present.     Comments: Rashes on  all four extremities, flat, not erythematous, whitish   Neurological:     General: No focal deficit present.     Mental Status: She is alert and oriented to person, place, and time.   Psychiatric:        Mood and Affect: Mood normal.        Behavior: Behavior normal.        Thought Content: Thought content normal.        Judgment: Judgment normal.     Labs reviewed: Basic Metabolic Panel: Recent Labs    09/01/23 0821  NA 141  K 4.4  CL 96*  CO2 29  GLUCOSE 101*  BUN 38*  CREATININE 10.19*  CALCIUM  8.8*   Liver Function Tests: Recent Labs    09/01/23 0821  AST 24  ALT  23  ALKPHOS 93  BILITOT 0.7  PROT 6.8  ALBUMIN  3.8   No results for input(s): LIPASE, AMYLASE in the last 8760 hours. No results for input(s): AMMONIA in the last 8760 hours. CBC: Recent Labs    09/01/23 0821  WBC 7.8  NEUTROABS 5.2  HGB 11.5*  HCT 35.1*  MCV 100.3*  PLT 198   Lipid Panel: No results for input(s): CHOL, HDL, LDLCALC, TRIG, CHOLHDL, LDLDIRECT in the last 8760 hours. Lab Results  Component Value Date   HGBA1C 4.7 02/23/2023    Procedures since last visit: No results found.  Assessment/Plan    Labs/tests ordered:     No follow-ups on file.  Deborah Stenseth Medina-Vargas, NP

## 2023-10-17 ENCOUNTER — Ambulatory Visit: Attending: Vascular Surgery | Admitting: Vascular Surgery

## 2023-10-17 ENCOUNTER — Encounter: Payer: Self-pay | Admitting: Vascular Surgery

## 2023-10-17 VITALS — BP 150/85 | HR 94 | Temp 97.8°F | Ht 65.0 in | Wt 171.0 lb

## 2023-10-17 DIAGNOSIS — N186 End stage renal disease: Secondary | ICD-10-CM

## 2023-10-17 NOTE — H&P (View-Only) (Signed)
 Patient ID: [name], female   DOB: 07-Mar-1946, 78 y.o.   MRN: [id number]  Reason for Consult: Follow-up   Referred by [name], NP  Subjective:     HPI:  [name] is a 78 y.o. female with end-stage renal disease with remote history of left upper arm AV graft and more recently right upper arm AV fistula converted to interposition graft.  This has undergone previous thrombectomy which was thought to have thrombosed secondary to hypotension.  She is now here with repeat thrombosis of her graft and she is currently dialyzing via right IJ tunneled dialysis catheter.  Graft has been thrombosed for approximately 1 month she thinks.  She remains on aspirin  and Plavix   Past Medical History:  Diagnosis Date   Anemia    Anxiety    Arthritis    lower back and knees    Asthma    childhood   Barrett's esophagus    CAD (coronary artery disease)    Chronic kidney disease    Coagulation defect (HCC)    Depression    Diabetes mellitus    Dysphagia    Eczema    End stage renal disease (HCC)    Fluid overload, unspecified    History of herpes zoster 02/2010   Recovered fully after period of acute herpetic neuralgia tx w/ gabapentin .    Hx MRSA infection 2009   Hypercalcemia    Hyperlipidemia    Hypertension    Mitral regurgitation    moderate-severe MR 08/11/22 echo   Moderate protein-calorie malnutrition (HCC)    Neuromuscular disorder (HCC)    chronic pain   Personal history of colonic polyps 10/17/2010   hyperplastic   Stroke [facility])    Family History  Problem Relation Age of Onset   Hypertension Father    Kidney disease Father    Diabetes Brother    Colon cancer Neg Hx    CAD Neg Hx    Rectal cancer Neg Hx    Stomach cancer Neg Hx    Past Surgical History:  Procedure Laterality Date   A/V FISTULAGRAM N/A 05/11/2023   Procedure: A/V Fistulagram;  Surgeon: [name], MD;  Location: [facility] INVASIVE CV LAB;  Service: Cardiovascular;  Laterality: N/A;    ABDOMINAL HYSTERECTOMY     unclear when   AV FISTULA PLACEMENT Left 02/13/2017   Procedure: ARTERIOVENOUS (AV) GORE-TEX STRETCH GRAFT INSERTION INTO LEFT ARM;  Surgeon: Harvey Carlin BRAVO, MD;  Location: [facility] OR;  Service: Vascular;  Laterality: Left;   AV FISTULA PLACEMENT Right 01/16/2022   Procedure: RIGHT ARM ARTERIOVENOUS (AV) FISTULA;  Surgeon: [name], MD;  Location: [facility] OR;  Service: Vascular;  Laterality: Right;   AV FISTULA PLACEMENT Right 09/01/2022   Procedure: INSERTION OF ARTERIOVENOUS (AV) GORE-TEX GRAFT RIGHT ARM;  Surgeon: Sheree Penne Lonni, MD;  Location: [facility] OR;  Service: Vascular;  Laterality: Right;   CATARACT EXTRACTION Bilateral    COLONOSCOPY     IR FLUORO GUIDE CV LINE RIGHT  01/09/2022   IR FLUORO GUIDE CV LINE RIGHT  09/03/2023   IR US  GUIDE VASC ACCESS RIGHT  01/09/2022   IR US  GUIDE VASC ACCESS RIGHT  09/03/2023   OTHER SURGICAL HISTORY     Catherer placed by [facility], right upper chest area.   PERIPHERAL VASCULAR BALLOON ANGIOPLASTY  05/11/2023   Procedure: PERIPHERAL VASCULAR BALLOON ANGIOPLASTY;  Surgeon: [name], MD;  Location: [facility] INVASIVE CV LAB;  Service: Cardiovascular;;  Venous  Anastomosis   PERIPHERAL VASCULAR BALLOON ANGIOPLASTY  05/30/2023   Procedure: PERIPHERAL VASCULAR BALLOON ANGIOPLASTY;  Surgeon: [name], MD;  Location: [facility] INVASIVE CV LAB;  Service: Cardiovascular;;  Venous Anastomosis; Distal Intragraft   PERIPHERAL VASCULAR THROMBECTOMY N/A 05/30/2023   Procedure: PERIPHERAL VASCULAR THROMBECTOMY;  Surgeon: [name], MD;  Location: [facility] INVASIVE CV LAB;  Service: Cardiovascular;  Laterality: N/A;   thigh surg     left side due to MRSA   UPPER GASTROINTESTINAL ENDOSCOPY      Short Social History:  Social History   Tobacco Use   Smoking status: Every Day    Current packs/day: 0.50    Average packs/day: 0.5 packs/day for 40.0 years (20.0 ttl pk-yrs)    Types: Cigarettes    Passive exposure: Never   Smokeless  tobacco: Never   Tobacco comments:    smokes 1 pack of cigerettes a week- off and on  Substance Use Topics   Alcohol use: Yes    Alcohol/week: 2.0 standard drinks of alcohol    Types: 2 Standard drinks or equivalent per week    Comment: wine and beer- occasionally     Allergies  Allergen Reactions   Lisinopril  Other (See Comments)    Abnormal Kidney Function    Pioglitazone Other (See Comments)    REACTION: Desquamation of skin of the palm   Morphine And Codeine Nausea And Vomiting   Sulfonamide Derivatives Rash    Current Outpatient Medications  Medication Sig Dispense Refill   acetaminophen  (TYLENOL ) 500 MG tablet Take 1,000 mg by mouth every 8 (eight) hours as needed for headache.     aspirin  81 MG chewable tablet Chew 1 tablet (81 mg total) by mouth daily. 30 tablet 0   B Complex-C-Zn-Folic Acid  (DIALYVITE 800 WITH ZINC ) 0.8 MG TABS Take 0.8 mg by mouth every evening.     calcium  acetate (PHOSLO) 667 MG capsule Take 1,334 mg by mouth 3 (three) times daily with meals.     camphor-menthol (SARNA) lotion Apply 1 Application topically as needed for itching.     Cinacalcet  HCl (SENSIPAR  PO) Take 1 capsule by mouth Every Tuesday,Thursday,and Saturday with dialysis.     clopidogrel  (PLAVIX ) 75 MG tablet TAKE 1 TABLET BY MOUTH EVERY DAY 90 tablet 1   ezetimibe  (ZETIA ) 10 MG tablet TAKE 1 TABLET BY MOUTH EVERY DAY 90 tablet 3   latanoprost  (XALATAN ) 0.005 % ophthalmic solution Place 1 drop into both eyes at bedtime.     pantoprazole  (PROTONIX ) 40 MG tablet TAKE 1 TABLET BY MOUTH EVERY DAY 90 tablet 0   pravastatin  (PRAVACHOL ) 40 MG tablet TAKE 1 TABLET BY MOUTH EVERYDAY AT BEDTIME 90 tablet 3   sertraline  (ZOLOFT ) 50 MG tablet TAKE 1 TABLET BY MOUTH EVERY DAY 90 tablet 1   traZODone  (DESYREL ) 100 MG tablet TAKE 1 TABLET BY MOUTH EVERYDAY AT BEDTIME 90 tablet 1   triamcinolone  cream (KENALOG ) 0.1 % Apply topically 3 (three) times daily as needed. L30.9 60 g 0   No current  facility-administered medications for this visit.    Review of Systems  Constitutional:  Constitutional negative. HENT: HENT negative.  Eyes: Eyes negative.  Respiratory: Respiratory negative.  Cardiovascular: Cardiovascular negative.  GI: Gastrointestinal negative.  Musculoskeletal: Musculoskeletal negative.  Skin: Skin negative.  Neurological: Neurological negative. Hematologic: Hematologic/lymphatic negative.  Psychiatric: Psychiatric negative.        Objective:  Objective   Vitals:   10/17/23 1358  Weight: 171 lb (77.6 kg)  Height: 5' 5 (  1.651 m)   Body mass index is 28.46 kg/m.  Physical Exam HENT:     Head: Normocephalic.     Mouth/Throat:     Mouth: Mucous membranes are moist.  Neck:     Comments: Tunneled dialysis catheter on the right with dressing in place Cardiovascular:     Rate and Rhythm: Normal rate.     Comments: Very strong brachial pulses bilaterally Pulmonary:     Effort: Pulmonary effort is normal.   Musculoskeletal:     Cervical back: Normal range of motion.     Comments: Right upper arm AV graft thrombosed   Neurological:     Mental Status: She is alert.     Data: No new studies     Assessment/Plan:    78 year old female with dialysis via tunneled dialysis catheter now with recently thrombosed right upper arm AV graft which was an interposition Artegraft to a cephalic vein fistula.  She also has remote history of occluded left upper extremity AV graft.  Plan will be for bilateral upper extremity venography.  She can possibly have a standard graft in the right upper extremity connected to the axillary vein if this is patent or possibly has patency on the left and even appears to possibly have a basilic vein on the surface of the left upper extremity.  We discussed continuing catheter until we can get new access which we will plan at the time of venography.  All questions were answered she demonstrates good understanding of bilateral upper  extremity venography to be planned on a nondialysis day in the near future and she can continue aspirin  and Plavix .     Penne Lonni [place] MD [facility]

## 2023-10-17 NOTE — Progress Notes (Signed)
 Patient ID: [name], female   DOB: 07-Mar-1946, 78 y.o.   MRN: [id number]  Reason for Consult: Follow-up   Referred by [name], NP  Subjective:     HPI:  [name] is a 78 y.o. female with end-stage renal disease with remote history of left upper arm AV graft and more recently right upper arm AV fistula converted to interposition graft.  This has undergone previous thrombectomy which was thought to have thrombosed secondary to hypotension.  She is now here with repeat thrombosis of her graft and she is currently dialyzing via right IJ tunneled dialysis catheter.  Graft has been thrombosed for approximately 1 month she thinks.  She remains on aspirin  and Plavix   Past Medical History:  Diagnosis Date   Anemia    Anxiety    Arthritis    lower back and knees    Asthma    childhood   Barrett's esophagus    CAD (coronary artery disease)    Chronic kidney disease    Coagulation defect (HCC)    Depression    Diabetes mellitus    Dysphagia    Eczema    End stage renal disease (HCC)    Fluid overload, unspecified    History of herpes zoster 02/2010   Recovered fully after period of acute herpetic neuralgia tx w/ gabapentin .    Hx MRSA infection 2009   Hypercalcemia    Hyperlipidemia    Hypertension    Mitral regurgitation    moderate-severe MR 08/11/22 echo   Moderate protein-calorie malnutrition (HCC)    Neuromuscular disorder (HCC)    chronic pain   Personal history of colonic polyps 10/17/2010   hyperplastic   Stroke [facility])    Family History  Problem Relation Age of Onset   Hypertension Father    Kidney disease Father    Diabetes Brother    Colon cancer Neg Hx    CAD Neg Hx    Rectal cancer Neg Hx    Stomach cancer Neg Hx    Past Surgical History:  Procedure Laterality Date   A/V FISTULAGRAM N/A 05/11/2023   Procedure: A/V Fistulagram;  Surgeon: [name], MD;  Location: [facility] INVASIVE CV LAB;  Service: Cardiovascular;  Laterality: N/A;    ABDOMINAL HYSTERECTOMY     unclear when   AV FISTULA PLACEMENT Left 02/13/2017   Procedure: ARTERIOVENOUS (AV) GORE-TEX STRETCH GRAFT INSERTION INTO LEFT ARM;  Surgeon: Harvey Carlin BRAVO, MD;  Location: [facility] OR;  Service: Vascular;  Laterality: Left;   AV FISTULA PLACEMENT Right 01/16/2022   Procedure: RIGHT ARM ARTERIOVENOUS (AV) FISTULA;  Surgeon: [name], MD;  Location: [facility] OR;  Service: Vascular;  Laterality: Right;   AV FISTULA PLACEMENT Right 09/01/2022   Procedure: INSERTION OF ARTERIOVENOUS (AV) GORE-TEX GRAFT RIGHT ARM;  Surgeon: Sheree Penne Lonni, MD;  Location: [facility] OR;  Service: Vascular;  Laterality: Right;   CATARACT EXTRACTION Bilateral    COLONOSCOPY     IR FLUORO GUIDE CV LINE RIGHT  01/09/2022   IR FLUORO GUIDE CV LINE RIGHT  09/03/2023   IR US  GUIDE VASC ACCESS RIGHT  01/09/2022   IR US  GUIDE VASC ACCESS RIGHT  09/03/2023   OTHER SURGICAL HISTORY     Catherer placed by [facility], right upper chest area.   PERIPHERAL VASCULAR BALLOON ANGIOPLASTY  05/11/2023   Procedure: PERIPHERAL VASCULAR BALLOON ANGIOPLASTY;  Surgeon: [name], MD;  Location: [facility] INVASIVE CV LAB;  Service: Cardiovascular;;  Venous  Anastomosis   PERIPHERAL VASCULAR BALLOON ANGIOPLASTY  05/30/2023   Procedure: PERIPHERAL VASCULAR BALLOON ANGIOPLASTY;  Surgeon: [name], MD;  Location: [facility] INVASIVE CV LAB;  Service: Cardiovascular;;  Venous Anastomosis; Distal Intragraft   PERIPHERAL VASCULAR THROMBECTOMY N/A 05/30/2023   Procedure: PERIPHERAL VASCULAR THROMBECTOMY;  Surgeon: [name], MD;  Location: [facility] INVASIVE CV LAB;  Service: Cardiovascular;  Laterality: N/A;   thigh surg     left side due to MRSA   UPPER GASTROINTESTINAL ENDOSCOPY      Short Social History:  Social History   Tobacco Use   Smoking status: Every Day    Current packs/day: 0.50    Average packs/day: 0.5 packs/day for 40.0 years (20.0 ttl pk-yrs)    Types: Cigarettes    Passive exposure: Never   Smokeless  tobacco: Never   Tobacco comments:    smokes 1 pack of cigerettes a week- off and on  Substance Use Topics   Alcohol use: Yes    Alcohol/week: 2.0 standard drinks of alcohol    Types: 2 Standard drinks or equivalent per week    Comment: wine and beer- occasionally     Allergies  Allergen Reactions   Lisinopril  Other (See Comments)    Abnormal Kidney Function    Pioglitazone Other (See Comments)    REACTION: Desquamation of skin of the palm   Morphine And Codeine Nausea And Vomiting   Sulfonamide Derivatives Rash    Current Outpatient Medications  Medication Sig Dispense Refill   acetaminophen  (TYLENOL ) 500 MG tablet Take 1,000 mg by mouth every 8 (eight) hours as needed for headache.     aspirin  81 MG chewable tablet Chew 1 tablet (81 mg total) by mouth daily. 30 tablet 0   B Complex-C-Zn-Folic Acid  (DIALYVITE 800 WITH ZINC ) 0.8 MG TABS Take 0.8 mg by mouth every evening.     calcium  acetate (PHOSLO) 667 MG capsule Take 1,334 mg by mouth 3 (three) times daily with meals.     camphor-menthol (SARNA) lotion Apply 1 Application topically as needed for itching.     Cinacalcet  HCl (SENSIPAR  PO) Take 1 capsule by mouth Every Tuesday,Thursday,and Saturday with dialysis.     clopidogrel  (PLAVIX ) 75 MG tablet TAKE 1 TABLET BY MOUTH EVERY DAY 90 tablet 1   ezetimibe  (ZETIA ) 10 MG tablet TAKE 1 TABLET BY MOUTH EVERY DAY 90 tablet 3   latanoprost  (XALATAN ) 0.005 % ophthalmic solution Place 1 drop into both eyes at bedtime.     pantoprazole  (PROTONIX ) 40 MG tablet TAKE 1 TABLET BY MOUTH EVERY DAY 90 tablet 0   pravastatin  (PRAVACHOL ) 40 MG tablet TAKE 1 TABLET BY MOUTH EVERYDAY AT BEDTIME 90 tablet 3   sertraline  (ZOLOFT ) 50 MG tablet TAKE 1 TABLET BY MOUTH EVERY DAY 90 tablet 1   traZODone  (DESYREL ) 100 MG tablet TAKE 1 TABLET BY MOUTH EVERYDAY AT BEDTIME 90 tablet 1   triamcinolone  cream (KENALOG ) 0.1 % Apply topically 3 (three) times daily as needed. L30.9 60 g 0   No current  facility-administered medications for this visit.    Review of Systems  Constitutional:  Constitutional negative. HENT: HENT negative.  Eyes: Eyes negative.  Respiratory: Respiratory negative.  Cardiovascular: Cardiovascular negative.  GI: Gastrointestinal negative.  Musculoskeletal: Musculoskeletal negative.  Skin: Skin negative.  Neurological: Neurological negative. Hematologic: Hematologic/lymphatic negative.  Psychiatric: Psychiatric negative.        Objective:  Objective   Vitals:   10/17/23 1358  Weight: 171 lb (77.6 kg)  Height: 5' 5 (  1.651 m)   Body mass index is 28.46 kg/m.  Physical Exam HENT:     Head: Normocephalic.     Mouth/Throat:     Mouth: Mucous membranes are moist.  Neck:     Comments: Tunneled dialysis catheter on the right with dressing in place Cardiovascular:     Rate and Rhythm: Normal rate.     Comments: Very strong brachial pulses bilaterally Pulmonary:     Effort: Pulmonary effort is normal.   Musculoskeletal:     Cervical back: Normal range of motion.     Comments: Right upper arm AV graft thrombosed   Neurological:     Mental Status: She is alert.     Data: No new studies     Assessment/Plan:    78 year old female with dialysis via tunneled dialysis catheter now with recently thrombosed right upper arm AV graft which was an interposition Artegraft to a cephalic vein fistula.  She also has remote history of occluded left upper extremity AV graft.  Plan will be for bilateral upper extremity venography.  She can possibly have a standard graft in the right upper extremity connected to the axillary vein if this is patent or possibly has patency on the left and even appears to possibly have a basilic vein on the surface of the left upper extremity.  We discussed continuing catheter until we can get new access which we will plan at the time of venography.  All questions were answered she demonstrates good understanding of bilateral upper  extremity venography to be planned on a nondialysis day in the near future and she can continue aspirin  and Plavix .     Penne Lonni [place] MD [facility]

## 2023-10-18 ENCOUNTER — Ambulatory Visit: Payer: 59 | Admitting: Dermatology

## 2023-10-18 ENCOUNTER — Other Ambulatory Visit: Payer: Self-pay

## 2023-10-18 DIAGNOSIS — N186 End stage renal disease: Secondary | ICD-10-CM

## 2023-10-23 ENCOUNTER — Telehealth: Payer: Self-pay | Admitting: Nurse Practitioner

## 2023-10-23 DIAGNOSIS — L309 Dermatitis, unspecified: Secondary | ICD-10-CM

## 2023-10-23 NOTE — Telephone Encounter (Signed)
 Copied from CRM [id number]. Topic: Clinical - Medication Refill >> Oct 23, 2023  2:06 PM Shamecia H wrote: Medication: triamcinolone  cream (KENALOG ) 0.1 %  Has the patient contacted their pharmacy? Yes (Agent: If no, request that the patient contact the pharmacy for the refill. If patient does not wish to contact the pharmacy document the reason why and proceed with request.) (Agent: If yes, when and what did the pharmacy advise?)  This is the patient's preferred pharmacy:  [id number] - [place], [place] - [address] AT [facility] [address] [place] [address] Phone: [phone] Fax: [phone]  Is this the correct pharmacy for this prescription? Yes If no, delete pharmacy and type the correct one.   Has the prescription been filled recently? Yes  Is the patient out of the medication? Yes  Has the patient been seen for an appointment in the last year OR does the patient have an upcoming appointment? Yes  Can we respond through MyChart? Yes  Agent: Please be advised that Rx refills may take up to 3 business days. We ask that you follow-up with your pharmacy.

## 2023-10-25 ENCOUNTER — Encounter: Payer: 59 | Admitting: Nurse Practitioner

## 2023-10-29 ENCOUNTER — Other Ambulatory Visit: Payer: Self-pay

## 2023-10-29 ENCOUNTER — Encounter (HOSPITAL_COMMUNITY)
Admission: RE | Disposition: A | Discharge status: Home / Self Care | Payer: Self-pay | Source: Home / Self Care | Attending: Vascular Surgery

## 2023-10-29 ENCOUNTER — Ambulatory Visit (HOSPITAL_COMMUNITY)
Admission: RE | Admit: 2023-10-29 | Discharge: 2023-10-29 | Disposition: A | Discharge status: Home / Self Care | Attending: Vascular Surgery | Admitting: Vascular Surgery

## 2023-10-29 ENCOUNTER — Telehealth: Payer: Self-pay

## 2023-10-29 DIAGNOSIS — N186 End stage renal disease: Secondary | ICD-10-CM | POA: Diagnosis not present

## 2023-10-29 DIAGNOSIS — E1122 Type 2 diabetes mellitus with diabetic chronic kidney disease: Secondary | ICD-10-CM | POA: Diagnosis not present

## 2023-10-29 DIAGNOSIS — I120 Hypertensive chronic kidney disease with stage 5 chronic kidney disease or end stage renal disease: Secondary | ICD-10-CM | POA: Insufficient documentation

## 2023-10-29 DIAGNOSIS — Z7982 Long term (current) use of aspirin: Secondary | ICD-10-CM | POA: Diagnosis not present

## 2023-10-29 DIAGNOSIS — Z992 Dependence on renal dialysis: Secondary | ICD-10-CM | POA: Diagnosis not present

## 2023-10-29 DIAGNOSIS — F17210 Nicotine dependence, cigarettes, uncomplicated: Secondary | ICD-10-CM | POA: Diagnosis not present

## 2023-10-29 DIAGNOSIS — Z7902 Long term (current) use of antithrombotics/antiplatelets: Secondary | ICD-10-CM | POA: Insufficient documentation

## 2023-10-29 DIAGNOSIS — T82868A Thrombosis of vascular prosthetic devices, implants and grafts, initial encounter: Secondary | ICD-10-CM | POA: Diagnosis present

## 2023-10-29 DIAGNOSIS — L309 Dermatitis, unspecified: Secondary | ICD-10-CM

## 2023-10-29 DIAGNOSIS — Y832 Surgical operation with anastomosis, bypass or graft as the cause of abnormal reaction of the patient, or of later complication, without mention of misadventure at the time of the procedure: Secondary | ICD-10-CM | POA: Insufficient documentation

## 2023-10-29 HISTORY — PX: UPPER EXTREMITY VENOGRAPHY: CATH118272

## 2023-10-29 LAB — GLUCOSE, CAPILLARY: Glucose-Capillary: 83 mg/dL (ref 70–99)

## 2023-10-29 SURGERY — UPPER EXTREMITY VENOGRAPHY
Anesthesia: LOCAL | Laterality: Left

## 2023-10-29 MED ORDER — SODIUM CHLORIDE 0.9% FLUSH: 3.0000 mL | INTRAVENOUS | Status: DC | PRN

## 2023-10-29 MED ORDER — IODIXANOL 320 MG/ML IV SOLN
INTRAVENOUS | Status: DC | PRN
Start: 2023-10-29 — End: 2023-10-29
  Administered 2023-10-29: 60 mL

## 2023-10-29 MED ORDER — SODIUM CHLORIDE 0.9 % IV SOLN
INTRAVENOUS | Status: DC | PRN
  Administered 2023-10-29: 250 mL via INTRAVENOUS

## 2023-10-29 MED ORDER — TRIAMCINOLONE ACETONIDE 0.1 % EX CREA: TOPICAL_CREAM | Freq: Three times a day (TID) | CUTANEOUS | 0 refills | Status: DC | PRN

## 2023-10-29 NOTE — Interval H&P Note (Signed)
 History and Physical Interval Note:  10/29/2023 7:14 AM  [name]  has presented today for surgery, with the diagnosis of end stage renal disease.  The various methods of treatment have been discussed with the patient and family. After consideration of risks, benefits and other options for treatment, the patient has consented to  Procedure(s): UPPER EXTREMITY VENOGRAPHY (Bilateral) as a surgical intervention.  The patient's history has been reviewed, patient examined, no change in status, stable for surgery.  I have reviewed the patient's chart and labs.  Questions were answered to the patient's satisfaction.     [name]

## 2023-10-29 NOTE — Telephone Encounter (Signed)
 Copied from CRM [id number]. Topic: Clinical - Medication Refill >> Oct 23, 2023  2:06 PM Shamecia H wrote: Medication: triamcinolone  cream (KENALOG ) 0.1 %  Has the patient contacted their pharmacy? Yes (Agent: If no, request that the patient contact the pharmacy for the refill. If patient does not wish to contact the pharmacy document the reason why and proceed with request.) (Agent: If yes, when and what did the pharmacy advise?)  This is the patient's preferred pharmacy:  [id number] - [place], [place] - [address] AT [facility] [address] [place] [address] Phone: [phone] Fax: [phone]  Is this the correct pharmacy for this prescription? Yes If no, delete pharmacy and type the correct one.   Has the prescription been filled recently? Yes  Is the patient out of the medication? Yes  Has the patient been seen for an appointment in the last year OR does the patient have an upcoming appointment? Yes  Can we respond through MyChart? Yes  Agent: Please be advised that Rx refills may take up to 3 business days. We ask that you follow-up with your pharmacy. >> Oct 29, 2023 11:20 AM Marda MATSU wrote: Patient Hummel calling on the status of her medication: triamcinolone  cream (KENALOG ) 0.1 %   Please advise

## 2023-10-29 NOTE — Op Note (Signed)
    Patient name: [name] MRN: [id number] DOB: [birth year] Sex: female  10/29/2023 Pre-operative Diagnosis: End-stage renal disease, need for permanent dialysis access Post-operative diagnosis:  Same Surgeon:  Penne BROCKS. Sheree, MD Procedure Performed: 1.  Left upper extremity venography  Indications: 78 year old female with history of bilateral upper extremity failed grafts.  Most recently she had an interposition graft on the right to her cephalic vein on the left she previously had a standard brachial to axillary graft with outflow stenting.  She is now indicated for venography to plan future access.  Findings: The IV in the right forearm infiltrated we are unable to obtain adequate views on the right.  On the left side she appears to have very diminutive basilic and cephalic veins which then give rise to a dual system of axillary veins 1 of which has a stent and is occluded but another which which is patent to the subclavian and left innominate veins.  As she appears centrally patent I have evaluated with ultrasound and on the right side she appears to have standard anatomy with a large brachial artery above the antecubitum and 2 axillary veins which are easily compressible in the axilla.  Plan will be for right brachial artery to axillary vein AV graft on a nondialysis day in the near future.  Patient dialyzes Tuesdays, Thursdays and Saturdays.   Procedure:  The patient was identified in the holding area and taken to room 8.  The patient was then placed supine on the table and her arms were prepped.  A time out was called.  We performed bilateral upper extremity venography initially attempting the right side but the vein did infiltrate where the IV was placed.  On the left side we were able to perform standard venography of the left upper extremity and evaluate the central veins for patency.  I then evaluated the right upper extremity with ultrasound and she appears to be a good candidate for  right arm AV graft.  This will be planned on a nondialysis day in the near future.  Contrast: 60cc     Cypress Hinkson C. Sheree, MD [facility] Office: [phone] Pager: [phone]

## 2023-10-29 NOTE — Telephone Encounter (Signed)
 Spoke with patient and informed her that she must keep her dermatology appointment. Patient stated she is unaware of a dermatology appointment, asked that I call to find out appointment details and call her back. I offered to give patient the phone number to call herself and she declined.   Outgoing call placed to [facility], patient has pending appointment this Wednesday 10/31/23 @ 9:45 am, arrive at 9:30 am, bring insurance card and identification card.  Call returned to patient and she wrote information down and plans to attend appointment.

## 2023-10-30 ENCOUNTER — Encounter (HOSPITAL_COMMUNITY): Payer: Self-pay | Admitting: Vascular Surgery

## 2023-10-30 ENCOUNTER — Other Ambulatory Visit: Payer: Self-pay

## 2023-10-30 ENCOUNTER — Telehealth: Payer: Self-pay

## 2023-10-30 DIAGNOSIS — N186 End stage renal disease: Secondary | ICD-10-CM

## 2023-10-30 NOTE — Telephone Encounter (Signed)
 LVM with person answering phone.  Calling for surgery scheduling.

## 2023-11-07 ENCOUNTER — Encounter (HOSPITAL_COMMUNITY): Payer: Self-pay | Admitting: Vascular Surgery

## 2023-11-07 ENCOUNTER — Other Ambulatory Visit: Payer: Self-pay

## 2023-11-07 NOTE — Progress Notes (Signed)
 PCP - [name], NP  Cardiologist - Verlin Lonni BIRCH, MD   PPM/ICD - denies Device Orders - n/a Rep Notified - n/a  Chest x-ray - 01-08-22 EKG - 09-01-23 Stress Test - 08-11-22 ECHO - 08-31-23 Cardiac Cath -   CPAP - denies  Dm -denies  Blood Thinner Instructions: clopidogrel  (PLAVIX )  Aspirin  Instructions: continue  ERAS Protcol - NPO  COVID TEST- n/a  Anesthesia review: Yes, HX CAD, HTN, DM, ESRD  Patient verbally denies any shortness of breath, fever, cough and chest pain during phone call   -------------  SDW INSTRUCTIONS given:  Your procedure is scheduled on November 09, 2023.  Report to [facility] Main Entrance A at 8:40 A.M., and check in at the Admitting office.  Call this number if you have problems the morning of surgery:  [phone]   Remember:  Do not eat or drink after midnight the night before your surgery      Take these medicines the morning of surgery with A SIP OF WATER  acetaminophen  (TYLENOL )  aspirin   clopidogrel  (PLAVIX )  ezetimibe  (ZETIA )  pantoprazole  (PROTONIX )  sertraline  (ZOLOFT )   As of today, STOP taking any Aspirin  (unless otherwise instructed by your surgeon) Aleve, Naproxen, Ibuprofen, Motrin, Advil, Goody's, BC's, all herbal medications, fish oil, and all vitamins.                      Do not wear jewelry, make up, or nail polish            Do not wear lotions, powders, perfumes/colognes, or deodorant.            Do not shave 48 hours prior to surgery.  Men may shave face and neck.            Do not bring valuables to the hospital.            [facility] is not responsible for any belongings or valuables.  Do NOT Smoke (Tobacco/Vaping) 24 hours prior to your procedure If you use a CPAP at night, you may bring all equipment for your overnight stay.   Contacts, glasses, dentures or bridgework may not be worn into surgery.      For patients admitted to the hospital, discharge time will be determined by your  treatment team.   Patients discharged the day of surgery will not be allowed to drive home, and someone needs to stay with them for 24 hours.    Special instructions:   [place]- Preparing For Surgery  Before surgery, you can play an important role. Because skin is not sterile, your skin needs to be as free of germs as possible. You can reduce the number of germs on your skin by washing with CHG (chlorahexidine gluconate) Soap before surgery.  CHG is an antiseptic cleaner which kills germs and bonds with the skin to continue killing germs even after washing.    Oral Hygiene is also important to reduce your risk of infection.  Remember - BRUSH YOUR TEETH THE MORNING OF SURGERY WITH YOUR REGULAR TOOTHPASTE  Please do not use if you have an allergy to CHG or antibacterial soaps. If your skin becomes reddened/irritated stop using the CHG.  Do not shave (including legs and underarms) for at least 48 hours prior to first CHG shower. It is OK to shave your face.  Please follow these instructions carefully.   Shower the NIGHT BEFORE SURGERY and the MORNING OF SURGERY with DIAL  Soap.  Pat yourself dry with a CLEAN TOWEL.  Wear CLEAN PAJAMAS to bed the night before surgery  Place CLEAN SHEETS on your bed the night of your first shower and DO NOT SLEEP WITH PETS.   Day of Surgery: Please shower morning of surgery  Wear Clean/Comfortable clothing the morning of surgery Do not apply any deodorants/lotions.   Remember to brush your teeth WITH YOUR REGULAR TOOTHPASTE.   Questions were answered. Patient verbalized understanding of instructions.

## 2023-11-07 NOTE — Progress Notes (Signed)
 Anesthesia Chart Review: SAME DAY WORK-UP  Case: [id number] Date/Time: 11/09/23 1057   Procedure: INSERTION, GRAFT, ARTERIOVENOUS, UPPER EXTREMITY (Right)   Anesthesia type: Choice   Diagnosis: ESRD (end stage renal disease) (HCC) [N18.6]   Pre-op  diagnosis: ESRD   [facility] / [facility]   Surgeons: [name], MD       DISCUSSION: Patient is a 78 year old female scheduled for the above procedure. S/p RUE AVGG 09/01/22. 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 She has  been using a right internal jugular TDC for hemodialysis. Following results of 10/29/22 LUE venography, a right brachial artery-axillary vein AVGG planned.  History includes smoking, HTN, HLD, DM2, CAD (coronary calcifications on 03/2020 chest CT), mitral regurgitation (moderate-severe MR 07/2022->mild MR 08/31/23), childhood asthma, ESRD (HD TTS), CVA (~ 2018), anemia, chronic pain.   She had a stress test and echocardiogram on 08/11/22. Stress test was normal/low risk. Echo showed LVEF 60-65%, no RWMA, mild LVH, grade 1 DD, normal RVSF, normal PASP,  moderate to severe MR. Echo repeated on 08/31/23 and showed LVEF 70-75% with improved MR to mild.  Appears she is on Plavix  since CVA ~ 2018. Per [facility] instructions, she may continue taking Aspirin  and Plavix . Will sent message to [facility]VS RN in hopes to confirm.   Anesthesia team to evaluate on the day of surgery.    VS: Ht 5' 5 (1.651 m)   Wt 78.5 kg   BMI 28.80 kg/m  BP Readings from Last 3 Encounters:  10/29/23 (!) 169/90  10/17/23 (!) 150/85  10/15/23 124/68   Pulse Readings from Last 3 Encounters:  10/29/23 76  10/17/23 94  10/15/23 87     PROVIDERS: [name], NP is PCP Verlin Bruckner, MD is cardiologist     LABS: For day of surgery. Last results in [facility] include: Lab Results  Component Value Date   WBC 7.8 09/01/2023   HGB 11.5 (L) 09/01/2023   HCT 35.1 (L) 09/01/2023   PLT 198 09/01/2023   GLUCOSE 101  (H) 09/01/2023   ALT 23 09/01/2023   AST 24 09/01/2023   NA 141 09/01/2023   K 4.4 09/01/2023   CL 96 (L) 09/01/2023   CREATININE 10.19 (H) 09/01/2023   BUN 38 (H) 09/01/2023   CO2 29 09/01/2023   HGBA1C 4.7 02/23/2023    EKG: 09/01/23: Sinus rhythm Left anterior fascicular block Abnormal R-wave progression, late transition Nonspecific T abnormalities, lateral leads ST elevation, consider inferior injury Baseline wander in lead(s) V1 No significant change since last tracing Confirmed by Bari Pfeiffer ([id number]) on 09/02/2023 7:23:56 AM   CV: Echo 08/31/2023: IMPRESSIONS:   1. Proximal septal thickening with narrow LVOT; peak velocity of 2.6 m/s  with valsalva.   2. Left ventricular ejection fraction, by estimation, is 70 to 75%. The  left ventricle has hyperdynamic function. The left ventricle has no  regional wall motion abnormalities. There is moderate left ventricular  hypertrophy. Left ventricular diastolic  parameters are consistent with Grade I diastolic dysfunction (impaired  relaxation). Elevated left atrial pressure. The average left ventricular  global longitudinal strain is -16.3 %. The global longitudinal strain is  abnormal.   3. Right ventricular systolic function is normal. The right ventricular  size is normal.   4. Left atrial size was mildly dilated.   5. The mitral valve is normal in structure. Mild mitral valve  regurgitation. No evidence of mitral stenosis.   6. The aortic valve is tricuspid.  Aortic valve regurgitation is not  visualized. No aortic stenosis is present.   7. The inferior vena cava is normal in size with greater than 50%  respiratory variability, suggesting right atrial pressure of 3 mmHg.  - Comparison 08/11/2022: LVEF 60-65%, no RWMA, mild LVH, grade 1 DD,, normal RV systolic function, normal PASP, moderate-severe MR, PISA 0.4 cm.     Nuclear stress test 08/11/2022:   The study is normal. The study is low risk.   No ST deviation was  noted.   LV perfusion is normal.   Left ventricular function is normal. End diastolic cavity size is normal.   Prior study not available for comparison.   Low risk stress nuclear study with normal perfusion and normal left ventricular regional and global systolic function.     U/S Carotid 12/03/2016: Summary:  Bilateral: soft plaque throughout CCA origin and proximal ICA and  ECA. 1-39% ICA plaquing. Vertebral artery flow is antegrade.     Past Medical History:  Diagnosis Date   Anemia    Anxiety    Arthritis    lower back and knees    Asthma    childhood   Barrett's esophagus    CAD (coronary artery disease)    Chronic kidney disease    Coagulation defect (HCC)    Depression    Diabetes mellitus    Dysphagia    Eczema    End stage renal disease (HCC)    Fluid overload, unspecified    History of herpes zoster 02/2010   Recovered fully after period of acute herpetic neuralgia tx w/ gabapentin .    Hx MRSA infection 2009   Hypercalcemia    Hyperlipidemia    Hypertension    Mitral regurgitation    moderate-severe MR 08/11/22 echo   Moderate protein-calorie malnutrition (HCC)    Neuromuscular disorder (HCC)    chronic pain   Personal history of colonic polyps 10/17/2010   hyperplastic   Stroke [facility])     Past Surgical History:  Procedure Laterality Date   A/V FISTULAGRAM N/A 05/11/2023   Procedure: A/V Fistulagram;  Surgeon: [name], MD;  Location: [facility] INVASIVE CV LAB;  Service: Cardiovascular;  Laterality: N/A;   ABDOMINAL HYSTERECTOMY     unclear when   AV FISTULA PLACEMENT Left 02/13/2017   Procedure: ARTERIOVENOUS (AV) GORE-TEX STRETCH GRAFT INSERTION INTO LEFT ARM;  Surgeon: Harvey Carlin BRAVO, MD;  Location: [facility] OR;  Service: Vascular;  Laterality: Left;   AV FISTULA PLACEMENT Right 01/16/2022   Procedure: RIGHT ARM ARTERIOVENOUS (AV) FISTULA;  Surgeon: [name], MD;  Location: [facility] OR;  Service: Vascular;  Laterality: Right;   AV FISTULA PLACEMENT  Right 09/01/2022   Procedure: INSERTION OF ARTERIOVENOUS (AV) GORE-TEX GRAFT RIGHT ARM;  Surgeon: Sheree Penne Lonni, MD;  Location: [facility] OR;  Service: Vascular;  Laterality: Right;   CATARACT EXTRACTION Bilateral    COLONOSCOPY     IR FLUORO GUIDE CV LINE RIGHT  01/09/2022   IR FLUORO GUIDE CV LINE RIGHT  09/03/2023   IR US  GUIDE VASC ACCESS RIGHT  01/09/2022   IR US  GUIDE VASC ACCESS RIGHT  09/03/2023   OTHER SURGICAL HISTORY     Catherer placed by [facility], right upper chest area.   PERIPHERAL VASCULAR BALLOON ANGIOPLASTY  05/11/2023   Procedure: PERIPHERAL VASCULAR BALLOON ANGIOPLASTY;  Surgeon: [name], MD;  Location: [facility] INVASIVE CV LAB;  Service: Cardiovascular;;  Venous Anastomosis   PERIPHERAL VASCULAR BALLOON ANGIOPLASTY  05/30/2023   Procedure:  PERIPHERAL VASCULAR BALLOON ANGIOPLASTY;  Surgeon: [name], MD;  Location: [facility] INVASIVE CV LAB;  Service: Cardiovascular;;  Venous Anastomosis; Distal Intragraft   PERIPHERAL VASCULAR THROMBECTOMY N/A 05/30/2023   Procedure: PERIPHERAL VASCULAR THROMBECTOMY;  Surgeon: [name], MD;  Location: [facility] INVASIVE CV LAB;  Service: Cardiovascular;  Laterality: N/A;   thigh surg     left side due to MRSA   UPPER EXTREMITY VENOGRAPHY Left 10/29/2023   Procedure: UPPER EXTREMITY VENOGRAPHY;  Surgeon: [name], MD;  Location: [facility] INVASIVE CV LAB;  Service: Cardiovascular;  Laterality: Left;   UPPER GASTROINTESTINAL ENDOSCOPY      MEDICATIONS: No current facility-administered medications for this encounter.    aspirin  81 MG chewable tablet   calcium  acetate (PHOSLO) 667 MG capsule   pravastatin  (PRAVACHOL ) 40 MG tablet   acetaminophen  (TYLENOL ) 500 MG tablet   B Complex-C-Zn-Folic Acid  (DIALYVITE 800 WITH ZINC ) 0.8 MG TABS   camphor-menthol (SARNA) lotion   Cinacalcet  HCl (SENSIPAR  PO)   clopidogrel  (PLAVIX ) 75 MG tablet   ezetimibe  (ZETIA ) 10 MG tablet   latanoprost  (XALATAN ) 0.005 % ophthalmic solution   pantoprazole   (PROTONIX ) 40 MG tablet   sertraline  (ZOLOFT ) 50 MG tablet   traZODone  (DESYREL ) 100 MG tablet   triamcinolone  cream (KENALOG ) 0.1 %    Isaiah Ruder, PA-C Surgical Short Stay/Anesthesiology [name] Phone [phone] [facility] Phone [phone] 11/07/2023 11:58 PM

## 2023-11-08 ENCOUNTER — Other Ambulatory Visit: Payer: Self-pay

## 2023-11-09 ENCOUNTER — Ambulatory Visit (HOSPITAL_COMMUNITY): Admitting: Vascular Surgery

## 2023-11-09 ENCOUNTER — Encounter (HOSPITAL_COMMUNITY)
Admission: RE | Disposition: A | Discharge status: Home / Self Care | Payer: Self-pay | Source: Home / Self Care | Attending: Vascular Surgery

## 2023-11-09 ENCOUNTER — Other Ambulatory Visit (HOSPITAL_COMMUNITY): Payer: Self-pay

## 2023-11-09 ENCOUNTER — Ambulatory Visit (HOSPITAL_COMMUNITY)
Admission: RE | Admit: 2023-11-09 | Discharge: 2023-11-09 | Disposition: A | Discharge status: Home / Self Care | Attending: Vascular Surgery | Admitting: Vascular Surgery

## 2023-11-09 ENCOUNTER — Other Ambulatory Visit: Payer: Self-pay

## 2023-11-09 ENCOUNTER — Encounter (HOSPITAL_COMMUNITY): Payer: Self-pay | Admitting: Vascular Surgery

## 2023-11-09 ENCOUNTER — Encounter (HOSPITAL_COMMUNITY): Admitting: Vascular Surgery

## 2023-11-09 ENCOUNTER — Encounter (HOSPITAL_COMMUNITY): Payer: Self-pay | Admitting: *Deleted

## 2023-11-09 DIAGNOSIS — E1122 Type 2 diabetes mellitus with diabetic chronic kidney disease: Secondary | ICD-10-CM | POA: Diagnosis not present

## 2023-11-09 DIAGNOSIS — Z7902 Long term (current) use of antithrombotics/antiplatelets: Secondary | ICD-10-CM | POA: Insufficient documentation

## 2023-11-09 DIAGNOSIS — Z992 Dependence on renal dialysis: Secondary | ICD-10-CM | POA: Diagnosis not present

## 2023-11-09 DIAGNOSIS — F17210 Nicotine dependence, cigarettes, uncomplicated: Secondary | ICD-10-CM | POA: Diagnosis not present

## 2023-11-09 DIAGNOSIS — I2510 Atherosclerotic heart disease of native coronary artery without angina pectoris: Secondary | ICD-10-CM | POA: Insufficient documentation

## 2023-11-09 DIAGNOSIS — T82868A Thrombosis of vascular prosthetic devices, implants and grafts, initial encounter: Secondary | ICD-10-CM | POA: Insufficient documentation

## 2023-11-09 DIAGNOSIS — I120 Hypertensive chronic kidney disease with stage 5 chronic kidney disease or end stage renal disease: Secondary | ICD-10-CM | POA: Diagnosis not present

## 2023-11-09 DIAGNOSIS — Z8614 Personal history of Methicillin resistant Staphylococcus aureus infection: Secondary | ICD-10-CM | POA: Insufficient documentation

## 2023-11-09 DIAGNOSIS — N186 End stage renal disease: Secondary | ICD-10-CM

## 2023-11-09 DIAGNOSIS — I340 Nonrheumatic mitral (valve) insufficiency: Secondary | ICD-10-CM | POA: Diagnosis not present

## 2023-11-09 DIAGNOSIS — Y832 Surgical operation with anastomosis, bypass or graft as the cause of abnormal reaction of the patient, or of later complication, without mention of misadventure at the time of the procedure: Secondary | ICD-10-CM | POA: Insufficient documentation

## 2023-11-09 DIAGNOSIS — J45909 Unspecified asthma, uncomplicated: Secondary | ICD-10-CM | POA: Insufficient documentation

## 2023-11-09 HISTORY — PX: INSERTION OF ARTERIOVENOUS (AV) ARTEGRAFT ARM: SHX6779

## 2023-11-09 LAB — POCT I-STAT, CHEM 8
BUN: 30 mg/dL — ABNORMAL HIGH (ref 8–23)
Calcium, Ion: 1.08 mmol/L — ABNORMAL LOW (ref 1.15–1.40)
Chloride: 110 mmol/L (ref 98–111)
Creatinine, Ser: 8.7 mg/dL — ABNORMAL HIGH (ref 0.44–1.00)
Glucose, Bld: 84 mg/dL (ref 70–99)
HCT: 35 % — ABNORMAL LOW (ref 36.0–46.0)
Hemoglobin: 11.9 g/dL — ABNORMAL LOW (ref 12.0–15.0)
Potassium: 5.1 mmol/L (ref 3.5–5.1)
Sodium: 137 mmol/L (ref 135–145)
TCO2: 19 mmol/L — ABNORMAL LOW (ref 22–32)

## 2023-11-09 LAB — GLUCOSE, CAPILLARY: Glucose-Capillary: 103 mg/dL — ABNORMAL HIGH (ref 70–99)

## 2023-11-09 SURGERY — INSERTION, GRAFT, ARTERIOVENOUS, UPPER EXTREMITY
Anesthesia: General | Site: Arm Lower | Laterality: Right

## 2023-11-09 MED ORDER — HEMOSTATIC AGENTS (NO CHARGE) OPTIME
TOPICAL | Status: DC | PRN
  Administered 2023-11-09: 1 via TOPICAL

## 2023-11-09 MED ORDER — OXYCODONE HCL 5 MG PO TABS: 5.0000 mg | ORAL_TABLET | Freq: Once | ORAL | Status: DC | PRN

## 2023-11-09 MED ORDER — ONDANSETRON HCL 4 MG/2ML IJ SOLN
INTRAMUSCULAR | Status: DC | PRN
  Administered 2023-11-09: 4 mg via INTRAVENOUS

## 2023-11-09 MED ORDER — ACETAMINOPHEN 10 MG/ML IV SOLN: 1000.0000 mg | Freq: Once | INTRAVENOUS | Status: DC | PRN

## 2023-11-09 MED ORDER — ORAL CARE MOUTH RINSE: 15.0000 mL | Freq: Once | OROMUCOSAL | Status: AC

## 2023-11-09 MED ORDER — EPHEDRINE SULFATE-NACL 50-0.9 MG/10ML-% IV SOSY
PREFILLED_SYRINGE | INTRAVENOUS | Status: DC | PRN
  Administered 2023-11-09: 5 mg via INTRAVENOUS

## 2023-11-09 MED ORDER — CEFAZOLIN SODIUM-DEXTROSE 2-4 GM/100ML-% IV SOLN
2.0000 g | INTRAVENOUS | Status: AC
  Administered 2023-11-09: 2 g via INTRAVENOUS
  Filled 2023-11-09: qty 100

## 2023-11-09 MED ORDER — DEXAMETHASONE SODIUM PHOSPHATE 10 MG/ML IJ SOLN
INTRAMUSCULAR | Status: DC | PRN
  Administered 2023-11-09: 5 mg via INTRAVENOUS

## 2023-11-09 MED ORDER — HEPARIN SODIUM (PORCINE) 1000 UNIT/ML IJ SOLN
INTRAMUSCULAR | Status: DC | PRN
  Administered 2023-11-09: 3000 [IU] via INTRAVENOUS

## 2023-11-09 MED ORDER — HEPARIN 6000 UNIT IRRIGATION SOLUTION
Status: DC | PRN
  Administered 2023-11-09: 1

## 2023-11-09 MED ORDER — FENTANYL CITRATE (PF) 100 MCG/2ML IJ SOLN: 25.0000 ug | INTRAMUSCULAR | Status: DC | PRN

## 2023-11-09 MED ORDER — ACETAMINOPHEN 10 MG/ML IV SOLN
INTRAVENOUS | Status: DC | PRN
Start: 2023-11-09 — End: 2023-11-09
  Administered 2023-11-09: 1000 mg via INTRAVENOUS

## 2023-11-09 MED ORDER — PROPOFOL 10 MG/ML IV BOLUS
INTRAVENOUS | Status: DC | PRN
  Administered 2023-11-09: 120 mg via INTRAVENOUS
  Administered 2023-11-09: 30 mg via INTRAVENOUS

## 2023-11-09 MED ORDER — FENTANYL CITRATE (PF) 100 MCG/2ML IJ SOLN
INTRAMUSCULAR | Status: DC | PRN
  Administered 2023-11-09: 25 ug via INTRAVENOUS
  Administered 2023-11-09: 50 ug via INTRAVENOUS
  Administered 2023-11-09: 25 ug via INTRAVENOUS

## 2023-11-09 MED ORDER — CHLORHEXIDINE GLUCONATE 4 % EX SOLN: 60.0000 mL | Freq: Once | CUTANEOUS | Status: DC

## 2023-11-09 MED ORDER — OXYCODONE HCL 5 MG PO TABS
5.0000 mg | ORAL_TABLET | Freq: Four times a day (QID) | ORAL | 0 refills | Status: DC | PRN
  Filled 2023-11-09: qty 12, 3d supply, fill #0

## 2023-11-09 MED ORDER — CHLORHEXIDINE GLUCONATE 0.12 % MT SOLN
OROMUCOSAL | Status: AC
  Administered 2023-11-09: 15 mL via OROMUCOSAL
  Filled 2023-11-09: qty 15

## 2023-11-09 MED ORDER — FENTANYL CITRATE (PF) 100 MCG/2ML IJ SOLN
INTRAMUSCULAR | Status: AC
  Filled 2023-11-09: qty 2

## 2023-11-09 MED ORDER — STERILE WATER FOR IRRIGATION IR SOLN
Status: DC | PRN
Start: 2023-11-09 — End: 2023-11-09
  Administered 2023-11-09: 1000 mL

## 2023-11-09 MED ORDER — PHENYLEPHRINE HCL-NACL 20-0.9 MG/250ML-% IV SOLN
INTRAVENOUS | Status: DC | PRN
  Administered 2023-11-09: 20 ug/min via INTRAVENOUS

## 2023-11-09 MED ORDER — 0.9 % SODIUM CHLORIDE (POUR BTL) OPTIME
TOPICAL | Status: DC | PRN
  Administered 2023-11-09: 1000 mL

## 2023-11-09 MED ORDER — MIDAZOLAM HCL 2 MG/2ML IJ SOLN
INTRAMUSCULAR | Status: AC
  Filled 2023-11-09: qty 2

## 2023-11-09 MED ORDER — SODIUM CHLORIDE 0.9% FLUSH: 3.0000 mL | INTRAVENOUS | Status: DC | PRN

## 2023-11-09 MED ORDER — HEPARIN 6000 UNIT IRRIGATION SOLUTION
Status: AC
  Filled 2023-11-09: qty 500

## 2023-11-09 MED ORDER — CHLORHEXIDINE GLUCONATE 0.12 % MT SOLN: 15.0000 mL | Freq: Once | OROMUCOSAL | Status: AC

## 2023-11-09 MED ORDER — SODIUM CHLORIDE 0.9 % IV SOLN: INTRAVENOUS | Status: DC

## 2023-11-09 MED ORDER — OXYCODONE HCL 5 MG/5ML PO SOLN: 5.0000 mg | Freq: Once | ORAL | Status: DC | PRN

## 2023-11-09 MED ORDER — ONDANSETRON HCL 4 MG/2ML IJ SOLN: 4.0000 mg | Freq: Once | INTRAMUSCULAR | Status: DC | PRN

## 2023-11-09 MED ORDER — LIDOCAINE 2% (20 MG/ML) 5 ML SYRINGE
INTRAMUSCULAR | Status: DC | PRN
  Administered 2023-11-09: 80 mg via INTRAVENOUS

## 2023-11-09 MED ORDER — ACETAMINOPHEN 10 MG/ML IV SOLN
INTRAVENOUS | Status: AC
  Filled 2023-11-09: qty 100

## 2023-11-09 MED ORDER — LIDOCAINE-EPINEPHRINE 1 %-1:100000 IJ SOLN
INTRAMUSCULAR | Status: AC
  Filled 2023-11-09: qty 1

## 2023-11-09 SURGICAL SUPPLY — 34 items
ARMBAND PINK RESTRICT EXTREMIT (MISCELLANEOUS) ×1 IMPLANT
BAG COUNTER SPONGE SURGICOUNT (BAG) ×1 IMPLANT
BLADE CLIPPER SURG (BLADE) ×1 IMPLANT
BNDG ELASTIC 4X5.8 VLCR STR LF (GAUZE/BANDAGES/DRESSINGS) ×1 IMPLANT
BNDG ELASTIC 6INX 5YD STR LF (GAUZE/BANDAGES/DRESSINGS) IMPLANT
CANISTER SUCTION 3000ML PPV (SUCTIONS) ×1 IMPLANT
CLIP TI MEDIUM 24 (CLIP) ×1 IMPLANT
CLIP TI MEDIUM 6 (CLIP) ×3 IMPLANT
COVER PROBE W GEL 5X96 (DRAPES) ×1 IMPLANT
DERMABOND ADVANCED .7 DNX12 (GAUZE/BANDAGES/DRESSINGS) ×1 IMPLANT
ELECTRODE REM PT RTRN 9FT ADLT (ELECTROSURGICAL) ×1 IMPLANT
GLOVE BIOGEL PI IND STRL 8 (GLOVE) ×1 IMPLANT
GOWN STRL REUS W/ TWL LRG LVL3 (GOWN DISPOSABLE) ×2 IMPLANT
GOWN STRL REUS W/TWL 2XL LVL3 (GOWN DISPOSABLE) ×2 IMPLANT
GRAFT GORETEX STRT 4-7X45 (Vascular Products) IMPLANT
HEMOSTAT SNOW SURGICEL 2X4 (HEMOSTASIS) IMPLANT
INSERT FOGARTY SM (MISCELLANEOUS) IMPLANT
KIT BASIN OR (CUSTOM PROCEDURE TRAY) ×1 IMPLANT
KIT TURNOVER KIT B (KITS) ×1 IMPLANT
NS IRRIG 1000ML POUR BTL (IV SOLUTION) ×1 IMPLANT
PACK CV ACCESS (CUSTOM PROCEDURE TRAY) ×1 IMPLANT
PAD ARMBOARD POSITIONER FOAM (MISCELLANEOUS) ×2 IMPLANT
SLING ARM FOAM STRAP LRG (SOFTGOODS) IMPLANT
SPIKE FLUID TRANSFER (MISCELLANEOUS) ×1 IMPLANT
SPONGE T-LAP 18X18 ~~LOC~~+RFID (SPONGE) IMPLANT
SUT MNCRL AB 4-0 PS2 18 (SUTURE) ×1 IMPLANT
SUT PROLENE 6 0 BV (SUTURE) ×1 IMPLANT
SUT PROLENE 7 0 BV 1 (SUTURE) IMPLANT
SUT SILK 2 0 PERMA HAND 18 BK (SUTURE) IMPLANT
SUT SILK 3 0 SH CR/8 (SUTURE) ×1 IMPLANT
SUT VIC AB 3-0 SH 27X BRD (SUTURE) ×2 IMPLANT
TOWEL GREEN STERILE (TOWEL DISPOSABLE) ×1 IMPLANT
UNDERPAD 30X36 HEAVY ABSORB (UNDERPADS AND DIAPERS) ×1 IMPLANT
WATER STERILE IRR 1000ML POUR (IV SOLUTION) ×1 IMPLANT

## 2023-11-09 NOTE — Anesthesia Preprocedure Evaluation (Addendum)
 Anesthesia Evaluation  Patient identified by MRN, date of birth, ID band Patient awake  General Assessment Comment:  Prior nerve block for similar procedure without documented issues  Reviewed: Allergy & Precautions, H&P , NPO status , Patient's Chart, lab work & pertinent test results  Airway Mallampati: III  TM Distance: >3 FB Neck ROM: Full    Dental  (+) Edentulous Upper, Edentulous Lower   Pulmonary asthma , Current Smoker and Patient abstained from smoking.   Pulmonary exam normal breath sounds clear to auscultation       Cardiovascular hypertension, Pt. on medications + CAD   Rhythm:Regular Rate:Normal  Echo 08/11/2022: IMPRESSIONS  1. Left ventricular ejection fraction, by estimation, is 60 to 65%. The  left ventricle has normal function. The left ventricle has no regional  wall motion abnormalities. There is mild left ventricular hypertrophy.  Left ventricular diastolic parameters  are consistent with Grade I diastolic dysfunction (impaired relaxation).  2. Right ventricular systolic function is normal. The right ventricular  size is normal. There is normal pulmonary artery systolic pressure.  3. PISA 0.4cm. The mitral valve is normal in structure. Moderate to  severe mitral valve regurgitation. No evidence of mitral stenosis.  4. The aortic valve is tricuspid. There is mild calcification of the  aortic valve. There is mild thickening of the aortic valve. Aortic valve  regurgitation is not visualized. Aortic valve sclerosis/calcification is  present, without any evidence of  aortic stenosis.  5. The inferior vena cava is normal in size with greater than 50%  respiratory variability, suggesting right atrial pressure of 3 mmHg.    Nuclear stress test 08/11/2022:   The study is normal. The study is low risk.   No ST deviation was noted.   LV perfusion is normal.   Left ventricular function is normal. End  diastolic cavity size is normal.   Prior study not available for comparison.  Low risk stress nuclear study with normal perfusion and normal left ventricular regional and global systolic function.     Neuro/Psych  Headaches PSYCHIATRIC DISORDERS Anxiety Depression    CVA    GI/Hepatic Neg liver ROS,GERD  Medicated,,  Endo/Other  diabetes    Renal/GU ESRF and DialysisRenal diseaseLast dialyzed yesterdawy  negative genitourinary   Musculoskeletal  (+) Arthritis , Osteoarthritis,    Abdominal   Peds  Hematology  (+) Blood dyscrasia, anemia   Anesthesia Other Findings Past Medical History: No date: Anemia No date: Anxiety No date: Arthritis     Comment:  lower back and knees  No date: Asthma     Comment:  childhood No date: Barrett's esophagus No date: CAD (coronary artery disease) No date: Chronic kidney disease No date: Coagulation defect (HCC) No date: Depression No date: Diabetes mellitus No date: Dysphagia No date: Eczema No date: End stage renal disease (HCC) No date: Fluid overload, unspecified 02/2010: History of herpes zoster     Comment:  Recovered fully after period of acute herpetic neuralgia              tx w/ gabapentin .  2009: Hx MRSA infection No date: Hypercalcemia No date: Hyperlipidemia No date: Hypertension No date: Mitral regurgitation     Comment:  moderate-severe MR 08/11/22 echo No date: Moderate protein-calorie malnutrition (HCC) No date: Neuromuscular disorder (HCC)     Comment:  chronic pain 10/17/2010: Personal history of colonic polyps     Comment:  hyperplastic No date: Stroke (HCC)   Reproductive/Obstetrics negative OB ROS  Anesthesia Physical Anesthesia Plan  ASA: 4  Anesthesia Plan: Regional and MAC   Post-op Pain Management: Tylenol  PO (pre-op )* and Regional block*   Induction: Intravenous  PONV Risk Score and Plan: 1 and Propofol  infusion, Treatment may vary  due to age or medical condition, Ondansetron  and Dexamethasone   Airway Management Planned: LMA  Additional Equipment: None  Intra-op Plan:   Post-operative Plan: Extubation in OR  Informed Consent: I have reviewed the patients History and Physical, chart, labs and discussed the procedure including the risks, benefits and alternatives for the proposed anesthesia with the patient or authorized representative who has indicated his/her understanding and acceptance.     Dental advisory given  Plan Discussed with: CRNA  Anesthesia Plan Comments: (Addendum: per surgeon he is not requesting a nerve block for this procedure. Will proceed with general / LMA anesthesia as I had already previously discussed with the patient as a possibility.  Discussed risks of anesthesia with patient, including possibility of difficulty with spontaneous ventilation under anesthesia necessitating airway intervention, PONV, and rare risks such as cardiac or respiratory or neurological events, and allergic reactions. Discussed the role of CRNA in patient's perioperative care. Patient understands. Discussed r/b/a of supraclavicular nerve block, including:  - bleeding, infection, nerve damage - pneumothorax - shortness of breath from hemidiaphragmatic paralysis due to phrenic nerve blockade - poor or non functioning block. - reactions and toxicity to local anesthetic Patient understands. )        Anesthesia Quick Evaluation

## 2023-11-09 NOTE — Op Note (Addendum)
 NAME: [name]    MRN: [id number] DOB: 11-Oct-1945    DATE OF OPERATION: 11/09/2023  PREOP DIAGNOSIS:    End stage renal disease  POSTOP DIAGNOSIS:    Same  PROCEDURE:    Right arm brachial artery to axillary vein graft using 4-75mm tapered PTFE  SURGEON: Fonda FORBES Rim  ASSIST: Sherrilee Holster, PA  ANESTHESIA: General  EBL: 25 mL  INDICATIONS:    [name] is a 78 y.o. female with end-stage renal disease and multiple attempts at long-term HD access.  She is currently being dialyzed through the catheter.  After discussing risks and benefits of right arm AV graft creation, Blimie elected to proceed.  FINDINGS:   4 mm brachial artery 7 mm paired axillary vein Graft tunneled medial to the previous access  TECHNIQUE:   After informed consent was obtained, the patient was brought to the operating room, placed supine on the operating room table.  General anesthesia was used.  The patient was prepped and draped in normal sterile fashion.  Surgical time-out was taken. Pre-op  antibiotics were given.  Procedure began with using ultrasound and sent the brachial artery and axillary vein in the right arm.  Both proved of sufficient size to continue with AV graft.  A longitudinal incision was made at the distal aspect of the humerus. The brachial artery was identified, gently dissected, and encircled with silastic loops. A second incision was then made over the axillary vein and similarly dissected and encircled. The tunneling device was then passed between these incisions. A 4 X 7 taper PTFE graft was passed through the tunnel, taking care to avoid kinking or twists. The graft was irrigated with heparinized saline solution.   Proximal and distal arterial control was then obtained and a longitudinal arteriotomy was made on the brachial artery. The artery was irrigated with heparinized saline solution. An end to side artery anastomosis was then performed from the beveled 4mm  graft to the brachial vein in a running fashion using running 6-0 prolene suture. When the clamps were removed from the artery, the graft demonstrated excellent inflow. It was flushed with heparinized saline prior to being clamped. There was an excellent signal at the wrist.  Attention was then directed to the axillary vein anastomosis. Proximal and distal control were obtained.  The vein was transected and the distal aspect oversewn using 2-0 silk tie. The vein was then irrigated with heparinized saline solution. The end of the PTFE graft was then cut at the appropriate length. An end to end vein anastomosis was then performed, using two running, 6-0 prolene sutures. Prior to establishing flow, all vessels were back bled and flushed to ensure there was no clot.    There was a strong distal signal in the radial artery and a thrill in the vein centrally there was an excellent thrill in the graft. Hemostasis was found to be satisfactory, and all wounds were irrigated with saline solution and closed in layers with vicryl and Monocryl at the skin. A dry sterile dressing was applied. Anesthetic care was terminated. The patient was transported to the recovery room in stable condition.   Fonda FORBES Rim, MD [facility] DATE OF DICTATION:   11/09/2023   Given the complexity of the case,  the assistant was necessary in order to expedient the procedure and safely perform the technical aspects of the operation.  The assistant provided traction and countertraction to assist with exposure of the artery and  vein.  They also assisted with suture ligation of multiple venous branches. They also assisted with tunneling of the graft.  They played a critical role for both anastomoses.. These skills, especially following the Prolene suture for the anastomosis, could not have been adequately performed by a scrub tech assistant.

## 2023-11-09 NOTE — Transfer of Care (Signed)
 Immediate Anesthesia Transfer of Care Note  Patient: [name]  Procedure(s) Performed: INSERTION BRACHIAL ARTERY TO AXILLARY VEIN ARTERIOVENOUS GRAFT, UPPER EXTREMITY (Right: Arm Lower)  Patient Location: PACU  Anesthesia Type:General  Level of Consciousness: drowsy, patient cooperative, and responds to stimulation  Airway & Oxygen Therapy: Patient Spontanous Breathing and Patient connected to nasal cannula oxygen  Post-op Assessment: Report given to RN and Post -op Vital signs reviewed and stable  Post vital signs: Reviewed and stable  Last Vitals:  Vitals Value Taken Time  BP 129/78 11/09/23 12:18  Temp    Pulse 117 11/09/23 12:22  Resp 22 11/09/23 12:22  SpO2 98 % 11/09/23 12:22  Vitals shown include unfiled device data.  Last Pain:  Vitals:   11/09/23 0910  TempSrc:   PainSc: 0-No pain         Complications: No notable events documented.

## 2023-11-09 NOTE — Discharge Instructions (Signed)
 [facility]  Discharge Instructions  AV Fistula or Graft Surgery for Dialysis Access  Please refer to the following instructions for your post-procedure care. Your surgeon or physician assistant will discuss any changes with you.  Activity  You may drive the day following your surgery, if you are comfortable and no longer taking prescription pain medication. Resume full activity as the soreness in your incision resolves.  Bathing/Showering  You may shower after you go home. Keep your incision dry for 48 hours. Do not soak in a bathtub, hot tub, or swim until the incision heals completely. You may not shower if you have a hemodialysis catheter.  Incision Care  Clean your incision with mild soap and water after 48 hours. Pat the area dry with a clean towel. You do not need a bandage unless otherwise instructed. Do not apply any ointments or creams to your incision. You may have skin glue on your incision. Do not peel it off. It will come off on its own in about one week. Your arm may swell a bit after surgery. To reduce swelling use pillows to elevate your arm so it is above your heart. Your doctor will tell you if you need to lightly wrap your arm with an ACE bandage.  Diet  Resume your normal diet. There are not special food restrictions following this procedure. In order to heal from your surgery, it is CRITICAL to get adequate nutrition. Your body requires vitamins, minerals, and protein. Vegetables are the best source of vitamins and minerals. Vegetables also provide the perfect balance of protein. Processed food has little nutritional value, so try to avoid this.  Medications  Resume taking all of your medications. If your incision is causing pain, you may take over-the counter pain relievers such as acetaminophen  (Tylenol ). If you were prescribed a stronger pain medication, please be aware these medications can cause nausea and constipation. Prevent  nausea by taking the medication with a snack or meal. Avoid constipation by drinking plenty of fluids and eating foods with high amount of fiber, such as fruits, vegetables, and grains.  Do not take Tylenol  if you are taking prescription pain medications.  Follow up Your surgeon may want to see you in the office following your access surgery. If so, this will be arranged at the time of your surgery.  Please call us  immediately for any of the following conditions:  Increased pain, redness, drainage (pus) from your incision site Fever of 101 degrees or higher Severe or worsening pain at your incision site Hand pain or numbness.  Reduce your risk of vascular disease:  Stop smoking. If you would like help, call [facility] at 1-800-QUIT-NOW ((954)375-5709) or [place] at [phone]  Manage your cholesterol Maintain a desired weight Control your diabetes Keep your blood pressure down  Dialysis  It will take several weeks to several months for your new dialysis access to be ready for use. Your surgeon will determine when it is okay to use it. Your nephrologist will continue to direct your dialysis. You can continue to use your Permcath until your new access is ready for use.   11/09/2023 [name] [id number] [birth year]  Surgeon(s): Lanis Fonda BRAVO, MD  Procedure(s): INSERTION BRACHIAL ARTERY TO AXILLARY VEIN ARTERIOVENOUS GRAFT, UPPER EXTREMITY   May stick graft immediately   May stick graft on designated area only:   x Do not stick graft for 6 weeks    If you have any questions, please call  the office at [phone].

## 2023-11-09 NOTE — H&P (Signed)
 Patient seen and examined in preop holding.  No complaints. No changes to medication history or physical exam since last seen. Patient recently underwent bilateral upper extremity venogram demonstrating patent right-sided central venous system with patent axillary veins. After discussing the risks and benefits of right sided brachial artery to axillary vein AV graft, [name] elected to proceed.   Fonda FORBES Rim MD     Patient ID: [name], female   DOB: June 27, 1945, 78 y.o.   MRN: [id number]   Reason for Consult: Follow-up   Referred by [name], NP   Subjective:    Subjective HPI:   [name] is a 78 y.o. female with end-stage renal disease with remote history of left upper arm AV graft and more recently right upper arm AV fistula converted to interposition graft.  This has undergone previous thrombectomy which was thought to have thrombosed secondary to hypotension.  She is now here with repeat thrombosis of her graft and she is currently dialyzing via right IJ tunneled dialysis catheter.  Graft has been thrombosed for approximately 1 month she thinks.  She remains on aspirin  and Plavix        Past Medical History:  Diagnosis Date   Anemia     Anxiety     Arthritis      lower back and knees    Asthma      childhood   Barrett's esophagus     CAD (coronary artery disease)     Chronic kidney disease     Coagulation defect (HCC)     Depression     Diabetes mellitus     Dysphagia     Eczema     End stage renal disease (HCC)     Fluid overload, unspecified     History of herpes zoster 02/2010    Recovered fully after period of acute herpetic neuralgia tx w/ gabapentin .    Hx MRSA infection 2009   Hypercalcemia     Hyperlipidemia     Hypertension     Mitral regurgitation      moderate-severe MR 08/11/22 echo   Moderate protein-calorie malnutrition (HCC)     Neuromuscular disorder (HCC)      chronic pain   Personal history of colonic  polyps 10/17/2010    hyperplastic   Stroke [facility])               Family History  Problem Relation Age of Onset   Hypertension Father     Kidney disease Father     Diabetes Brother     Colon cancer Neg Hx     CAD Neg Hx     Rectal cancer Neg Hx     Stomach cancer Neg Hx               Past Surgical History:  Procedure Laterality Date   A/V FISTULAGRAM N/A 05/11/2023    Procedure: A/V Fistulagram;  Surgeon: [name], MD;  Location: [facility] INVASIVE CV LAB;  Service: Cardiovascular;  Laterality: N/A;   ABDOMINAL HYSTERECTOMY        unclear when   AV FISTULA PLACEMENT Left 02/13/2017    Procedure: ARTERIOVENOUS (AV) GORE-TEX STRETCH GRAFT INSERTION INTO LEFT ARM;  Surgeon: [name], MD;  Location: [facility] OR;  Service: Vascular;  Laterality: Left;   AV FISTULA PLACEMENT Right 01/16/2022    Procedure: RIGHT ARM ARTERIOVENOUS (AV) FISTULA;  Surgeon: [name], MD;  Location: [facility] OR;  Service:  Vascular;  Laterality: Right;   AV FISTULA PLACEMENT Right 09/01/2022    Procedure: INSERTION OF ARTERIOVENOUS (AV) GORE-TEX GRAFT RIGHT ARM;  Surgeon: [name], MD;  Location: [facility] OR;  Service: Vascular;  Laterality: Right;   CATARACT EXTRACTION Bilateral     COLONOSCOPY       IR FLUORO GUIDE CV LINE RIGHT   01/09/2022   IR FLUORO GUIDE CV LINE RIGHT   09/03/2023   IR US  GUIDE VASC ACCESS RIGHT   01/09/2022   IR US  GUIDE VASC ACCESS RIGHT   09/03/2023   OTHER SURGICAL HISTORY        Catherer placed by [facility], right upper chest area.   PERIPHERAL VASCULAR BALLOON ANGIOPLASTY   05/11/2023    Procedure: PERIPHERAL VASCULAR BALLOON ANGIOPLASTY;  Surgeon: [name], MD;  Location: [facility] INVASIVE CV LAB;  Service: Cardiovascular;;  Venous Anastomosis   PERIPHERAL VASCULAR BALLOON ANGIOPLASTY   05/30/2023    Procedure: PERIPHERAL VASCULAR BALLOON ANGIOPLASTY;  Surgeon: [name], MD;  Location: [facility] INVASIVE CV LAB;  Service: Cardiovascular;;  Venous Anastomosis; Distal  Intragraft   PERIPHERAL VASCULAR THROMBECTOMY N/A 05/30/2023    Procedure: PERIPHERAL VASCULAR THROMBECTOMY;  Surgeon: [name], MD;  Location: [facility] INVASIVE CV LAB;  Service: Cardiovascular;  Laterality: N/A;   thigh surg        left side due to MRSA   UPPER GASTROINTESTINAL ENDOSCOPY              Short Social History:  Social History         Tobacco Use   Smoking status: Every Day      Current packs/day: 0.50      Average packs/day: 0.5 packs/day for 40.0 years (20.0 ttl pk-yrs)      Types: Cigarettes      Passive exposure: Never   Smokeless tobacco: Never   Tobacco comments:      smokes 1 pack of cigerettes a week- off and on  Substance Use Topics   Alcohol use: Yes      Alcohol/week: 2.0 standard drinks of alcohol      Types: 2 Standard drinks or equivalent per week      Comment: wine and beer- occasionally       Allergies       Allergies  Allergen Reactions   Lisinopril  Other (See Comments)      Abnormal Kidney Function    Pioglitazone Other (See Comments)      REACTION: Desquamation of skin of the palm   Morphine And Codeine Nausea And Vomiting   Sulfonamide Derivatives Rash              Current Outpatient Medications  Medication Sig Dispense Refill   acetaminophen  (TYLENOL ) 500 MG tablet Take 1,000 mg by mouth every 8 (eight) hours as needed for headache.       aspirin  81 MG chewable tablet Chew 1 tablet (81 mg total) by mouth daily. 30 tablet 0   B Complex-C-Zn-Folic Acid  (DIALYVITE 800 WITH ZINC ) 0.8 MG TABS Take 0.8 mg by mouth every evening.       calcium  acetate (PHOSLO) 667 MG capsule Take 1,334 mg by mouth 3 (three) times daily with meals.       camphor-menthol (SARNA) lotion Apply 1 Application topically as needed for itching.       Cinacalcet  HCl (SENSIPAR  PO) Take 1 capsule by mouth Every Tuesday,Thursday,and Saturday with dialysis.       clopidogrel  (PLAVIX ) 75 MG  tablet TAKE 1 TABLET BY MOUTH EVERY DAY 90 tablet 1   ezetimibe  (ZETIA ) 10 MG  tablet TAKE 1 TABLET BY MOUTH EVERY DAY 90 tablet 3   latanoprost  (XALATAN ) 0.005 % ophthalmic solution Place 1 drop into both eyes at bedtime.       pantoprazole  (PROTONIX ) 40 MG tablet TAKE 1 TABLET BY MOUTH EVERY DAY 90 tablet 0   pravastatin  (PRAVACHOL ) 40 MG tablet TAKE 1 TABLET BY MOUTH EVERYDAY AT BEDTIME 90 tablet 3   sertraline  (ZOLOFT ) 50 MG tablet TAKE 1 TABLET BY MOUTH EVERY DAY 90 tablet 1   traZODone  (DESYREL ) 100 MG tablet TAKE 1 TABLET BY MOUTH EVERYDAY AT BEDTIME 90 tablet 1   triamcinolone  cream (KENALOG ) 0.1 % Apply topically 3 (three) times daily as needed. L30.9 60 g 0      No current facility-administered medications for this visit.        Review of Systems  Constitutional:  Constitutional negative. HENT: HENT negative.  Eyes: Eyes negative.  Respiratory: Respiratory negative.  Cardiovascular: Cardiovascular negative.  GI: Gastrointestinal negative.  Musculoskeletal: Musculoskeletal negative.  Skin: Skin negative.  Neurological: Neurological negative. Hematologic: Hematologic/lymphatic negative.  Psychiatric: Psychiatric negative.          Objective:    Objective[] Expand by Default    Vitals:    10/17/23 1358  Weight: 171 lb (77.6 kg)  Height: 5' 5 (1.651 m)    Body mass index is 28.46 kg/m.   Physical Exam HENT:     Head: Normocephalic.     Mouth/Throat:     Mouth: Mucous membranes are moist.  Neck:     Comments: Tunneled dialysis catheter on the right with dressing in place Cardiovascular:     Rate and Rhythm: Normal rate.     Comments: Very strong brachial pulses bilaterally Pulmonary:     Effort: Pulmonary effort is normal.    Musculoskeletal:     Cervical back: Normal range of motion.     Comments: Right upper arm AV graft thrombosed    Neurological:     Mental Status: She is alert.        Data: No new studies      Assessment/Plan:    Assessment 78 year old female with dialysis via tunneled dialysis catheter now with  recently thrombosed right upper arm AV graft which was an interposition Artegraft to a cephalic vein fistula.  She also has remote history of occluded left upper extremity AV graft.  Plan will be for bilateral upper extremity venography.  She can possibly have a standard graft in the right upper extremity connected to the axillary vein if this is patent or possibly has patency on the left and even appears to possibly have a basilic vein on the surface of the left upper extremity.  We discussed continuing catheter until we can get new access which we will plan at the time of venography.  All questions were answered she demonstrates good understanding of bilateral upper extremity venography to be planned on a nondialysis day in the near future and she can continue aspirin  and Plavix .       Penne Lonni Colorado MD [facility]

## 2023-11-09 NOTE — Anesthesia Procedure Notes (Signed)
 Procedure Name: LMA Insertion Date/Time: 11/09/2023 10:23 AM  Performed by: Virgil Ee, CRNAPre-anesthesia Checklist: Patient identified, Patient being monitored, Timeout performed, Emergency Drugs available and Suction available Patient Re-evaluated:Patient Re-evaluated prior to induction Oxygen Delivery Method: Circle system utilized Preoxygenation: Pre-oxygenation with 100% oxygen Induction Type: IV induction Ventilation: Mask ventilation without difficulty LMA: LMA inserted LMA Size: 4.0 Tube type: Oral Number of attempts: 1 Placement Confirmation: positive ETCO2 and breath sounds checked- equal and bilateral Tube secured with: Tape Dental Injury: Teeth and Oropharynx as per pre-operative assessment

## 2023-11-09 NOTE — Anesthesia Postprocedure Evaluation (Signed)
 Anesthesia Post Note  Patient: [name]  Procedure(s) Performed: INSERTION BRACHIAL ARTERY TO AXILLARY VEIN ARTERIOVENOUS GRAFT, UPPER EXTREMITY (Right: Arm Lower)     Patient location during evaluation: PACU Anesthesia Type: General Level of consciousness: awake and alert Pain management: pain level controlled Vital Signs Assessment: post-procedure vital signs reviewed and stable Respiratory status: spontaneous breathing, nonlabored ventilation, respiratory function stable and patient connected to nasal cannula oxygen Cardiovascular status: blood pressure returned to baseline and stable Postop Assessment: no apparent nausea or vomiting Anesthetic complications: no   No notable events documented.  Last Vitals:  Vitals:   11/09/23 1230 11/09/23 1245  BP: 118/70 105/63  Pulse: (!) 108 (!) 104  Resp: 12 13  Temp:  37.1 C  SpO2: 93% 95%    Last Pain:  Vitals:   11/09/23 1245  TempSrc:   PainSc: 0-No pain                 Rome Ade

## 2023-11-12 ENCOUNTER — Encounter (HOSPITAL_COMMUNITY): Payer: Self-pay | Admitting: Vascular Surgery

## 2023-11-23 ENCOUNTER — Other Ambulatory Visit: Payer: Self-pay | Admitting: Nurse Practitioner

## 2023-11-23 ENCOUNTER — Other Ambulatory Visit: Payer: Self-pay | Admitting: Gastroenterology

## 2023-11-23 DIAGNOSIS — L309 Dermatitis, unspecified: Secondary | ICD-10-CM

## 2023-11-23 DIAGNOSIS — I639 Cerebral infarction, unspecified: Secondary | ICD-10-CM

## 2023-11-23 NOTE — Telephone Encounter (Signed)
 Patient Paxil  40mg  was discontinued.

## 2023-11-23 NOTE — Telephone Encounter (Signed)
 Copied from CRM [id number]. Topic: Clinical - Medication Refill >> Nov 23, 2023  8:11 AM Alfonso ORN wrote: Medication: sertraline  (ZOLOFT ) 50 MG tablet  Has the patient contacted their pharmacy? No (Agent: If no, request that the patient contact the pharmacy for the refill. If patient does not wish to contact the pharmacy document the reason why and proceed with request.) (Agent: If yes, when and what did the pharmacy advise?)  This is the patient's preferred pharmacy:  [id number] - [place], [place] - [address] AT [facility] [address] [place] [address] Phone: [phone] Fax: [phone]  Is this the correct pharmacy for this prescription? Yes If no, delete pharmacy and type the correct one.   Has the prescription been filled recently? No  Is the patient out of the medication? Yes  Has the patient been seen for an appointment in the last year OR does the patient have an upcoming appointment? Yes  Can we respond through MyChart? Yes  Agent: Please be advised that Rx refills may take up to 3 business days. We ask that you follow-up with your pharmacy.

## 2023-11-23 NOTE — Telephone Encounter (Signed)
 Red high risk warning. Sent to pcp for further approval.

## 2023-11-26 MED ORDER — SERTRALINE HCL 50 MG PO TABS: 50.0000 mg | ORAL_TABLET | Freq: Every day | ORAL | 1 refills | Status: AC

## 2023-11-30 ENCOUNTER — Encounter: Payer: Self-pay | Admitting: Nurse Practitioner

## 2023-11-30 ENCOUNTER — Ambulatory Visit: Admitting: Nurse Practitioner

## 2023-11-30 VITALS — BP 120/76 | HR 88 | Temp 97.7°F | Resp 14 | Ht 65.0 in | Wt 169.8 lb

## 2023-11-30 DIAGNOSIS — E2839 Other primary ovarian failure: Secondary | ICD-10-CM | POA: Diagnosis not present

## 2023-11-30 DIAGNOSIS — Z0000 Encounter for general adult medical examination without abnormal findings: Secondary | ICD-10-CM | POA: Diagnosis not present

## 2023-11-30 DIAGNOSIS — E785 Hyperlipidemia, unspecified: Secondary | ICD-10-CM

## 2023-11-30 DIAGNOSIS — E1142 Type 2 diabetes mellitus with diabetic polyneuropathy: Secondary | ICD-10-CM

## 2023-11-30 DIAGNOSIS — F17200 Nicotine dependence, unspecified, uncomplicated: Secondary | ICD-10-CM | POA: Diagnosis not present

## 2023-11-30 MED ORDER — PANTOPRAZOLE SODIUM 40 MG PO TBEC: 40.0000 mg | DELAYED_RELEASE_TABLET | Freq: Every day | ORAL | 0 refills | Status: DC

## 2023-11-30 NOTE — Patient Instructions (Addendum)
  Ms. Koestner , Thank you for taking time to come for your Medicare Wellness Visit. I appreciate your ongoing commitment to your health goals. Please review the following plan we discussed and let me know if I can assist you in the future.   COVID booster and flu shot due- we can give flu shot here If you get at dialysis let us  know.   Make sure [facility] sends us  your diabetic eye exam please    This is a list of the screening recommended for you and due dates:  Health Maintenance  Topic Date Due   Eye exam for diabetics  02/01/2021   Screening for Lung Cancer  04/09/2021   COVID-19 Vaccine (5 - 2024-25 season) 12/24/2022   Lipid (cholesterol) test  06/13/2023   Hemoglobin A1C  08/23/2023   Flu Shot  11/23/2023   Complete foot exam   02/05/2024   Medicare Annual Wellness Visit  11/29/2024   DTaP/Tdap/Td vaccine (3 - Td or Tdap) 04/07/2030   Pneumococcal Vaccine for age over 65  Completed   Hepatitis B Vaccine  Completed   DEXA scan (bone density measurement)  Completed   Hepatitis C Screening  Completed   Zoster (Shingles) Vaccine  Completed   HPV Vaccine  Aged Out   Meningitis B Vaccine  Aged Out   Colon Cancer Screening  Discontinued

## 2023-11-30 NOTE — Progress Notes (Signed)
 Subjective:   [name] is a 78 y.o. female who presents for Medicare Annual (Subsequent) preventive examination.  Visit Complete: In person [facility] clinic  Cardiac Risk Factors include: advanced age (>54men, >1 women);sedentary lifestyle;diabetes mellitus;hypertension;dyslipidemia;smoking/ tobacco exposure     Objective:    Today's Vitals   11/30/23 0854  BP: 120/76  Pulse: 88  Resp: 14  Temp: 97.7 F (36.5 C)  SpO2: 93%  Weight: 169 lb 12.8 oz (77 kg)  Height: 5' 5 (1.651 m)   Body mass index is 28.26 kg/m.     11/09/2023    9:15 AM 10/29/2023    7:36 AM 09/03/2023   12:58 PM 09/01/2023    8:15 AM 05/04/2023    8:40 AM 04/20/2023    4:36 PM 10/23/2022   10:38 AM  Advanced Directives  Does Patient Have a Medical Advance Directive? Yes Yes No No Yes Yes No  Type of Advance Directive     Out of facility DNR (pink MOST or yellow form) Out of facility DNR (pink MOST or yellow form)   Does patient want to make changes to medical advance directive? No - Patient declined No - Patient declined   No - Patient declined No - Patient declined No - Patient declined  Would patient like information on creating a medical advance directive?   No - Patient declined No - Patient declined     Pre-existing out of facility DNR order (yellow form or pink MOST form)     Yellow form placed in chart (order not valid for inpatient use) Yellow form placed in chart (order not valid for inpatient use)     Current Medications (verified) Outpatient Encounter Medications as of 11/30/2023  Medication Sig   acetaminophen  (TYLENOL ) 500 MG tablet Take 1,000 mg by mouth every 8 (eight) hours as needed for headache.   aspirin  81 MG chewable tablet Chew 1 tablet (81 mg total) by mouth daily.   calcium  acetate (PHOSLO) 667 MG capsule Take 1,334 mg by mouth 3 (three) times daily with meals.   camphor-menthol (SARNA) lotion Apply 1 Application topically as needed for itching.   Cinacalcet  HCl (SENSIPAR  PO) Take  1 capsule by mouth Every Tuesday,Thursday,and Saturday with dialysis.   clopidogrel  (PLAVIX ) 75 MG tablet TAKE 1 TABLET BY MOUTH EVERY DAY   ezetimibe  (ZETIA ) 10 MG tablet TAKE 1 TABLET BY MOUTH EVERY DAY   folic acid -vitamin b complex-vitamin c-selenium-zinc  (DIALYVITE) 3 MG TABS tablet Take 1 tablet by mouth daily.   latanoprost  (XALATAN ) 0.005 % ophthalmic solution Place 1 drop into both eyes at bedtime.   pravastatin  (PRAVACHOL ) 40 MG tablet TAKE 1 TABLET BY MOUTH EVERYDAY AT BEDTIME   sertraline  (ZOLOFT ) 50 MG tablet Take 1 tablet (50 mg total) by mouth daily.   sevelamer (RENAGEL) 800 MG tablet Take 2,400 mg by mouth 3 (three) times daily with meals.   traZODone  (DESYREL ) 100 MG tablet TAKE 1 TABLET BY MOUTH EVERYDAY AT BEDTIME   triamcinolone  cream (KENALOG ) 0.1 % Apply topically 3 (three) times daily as needed. DX L30.9 MUST KEEP APPT WITH DERMATOLOGY   [DISCONTINUED] oxyCODONE  (ROXICODONE ) 5 MG immediate release tablet Take 1 tablet (5 mg total) by mouth every 6 (six) hours as needed for severe pain (pain score 7-10).   [DISCONTINUED] pantoprazole  (PROTONIX ) 40 MG tablet TAKE 1 TABLET BY MOUTH EVERY DAY   pantoprazole  (PROTONIX ) 40 MG tablet Take 1 tablet (40 mg total) by mouth daily.   [DISCONTINUED] hydrOXYzine  (ATARAX ) 10 MG tablet Take  10 mg by mouth 3 (three) times daily. (Patient not taking: Reported on 11/09/2023)   No facility-administered encounter medications on file as of 11/30/2023.    Allergies (verified) Lisinopril , Pioglitazone, Morphine and codeine, and Sulfonamide derivatives   History: Past Medical History:  Diagnosis Date   Anemia    Anxiety    Arthritis    lower back and knees    Asthma    childhood   Barrett's esophagus    CAD (coronary artery disease)    Chronic kidney disease    Coagulation defect (HCC)    Depression    Diabetes mellitus    Dysphagia    Eczema    End stage renal disease (HCC)    Fluid overload, unspecified    History of herpes  zoster 02/2010   Recovered fully after period of acute herpetic neuralgia tx w/ gabapentin .    Hx MRSA infection 2009   Hypercalcemia    Hyperlipidemia    Hypertension    Mitral regurgitation    moderate-severe MR 08/11/22 echo   Moderate protein-calorie malnutrition (HCC)    Neuromuscular disorder (HCC)    chronic pain   Personal history of colonic polyps 10/17/2010   hyperplastic   Stroke [facility])    Past Surgical History:  Procedure Laterality Date   A/V FISTULAGRAM N/A 05/11/2023   Procedure: A/V Fistulagram;  Surgeon: [name], MD;  Location: [facility] INVASIVE CV LAB;  Service: Cardiovascular;  Laterality: N/A;   ABDOMINAL HYSTERECTOMY     unclear when   AV FISTULA PLACEMENT Left 02/13/2017   Procedure: ARTERIOVENOUS (AV) GORE-TEX STRETCH GRAFT INSERTION INTO LEFT ARM;  Surgeon: Harvey Carlin BRAVO, MD;  Location: [facility] OR;  Service: Vascular;  Laterality: Left;   AV FISTULA PLACEMENT Right 01/16/2022   Procedure: RIGHT ARM ARTERIOVENOUS (AV) FISTULA;  Surgeon: [name], MD;  Location: [facility] OR;  Service: Vascular;  Laterality: Right;   AV FISTULA PLACEMENT Right 09/01/2022   Procedure: INSERTION OF ARTERIOVENOUS (AV) GORE-TEX GRAFT RIGHT ARM;  Surgeon: Sheree Penne Lonni, MD;  Location: [facility] OR;  Service: Vascular;  Laterality: Right;   CATARACT EXTRACTION Bilateral    COLONOSCOPY     INSERTION OF ARTERIOVENOUS (AV) ARTEGRAFT ARM Right 11/09/2023   Procedure: INSERTION BRACHIAL ARTERY TO AXILLARY VEIN ARTERIOVENOUS GRAFT, UPPER EXTREMITY;  Surgeon: Lanis Fonda BRAVO, MD;  Location: [facility] OR;  Service: Vascular;  Laterality: Right;   IR FLUORO GUIDE CV LINE RIGHT  01/09/2022   IR FLUORO GUIDE CV LINE RIGHT  09/03/2023   IR US  GUIDE VASC ACCESS RIGHT  01/09/2022   IR US  GUIDE VASC ACCESS RIGHT  09/03/2023   OTHER SURGICAL HISTORY     Catherer placed by [facility], right upper chest area.   PERIPHERAL VASCULAR BALLOON ANGIOPLASTY  05/11/2023   Procedure: PERIPHERAL VASCULAR BALLOON  ANGIOPLASTY;  Surgeon: [name], MD;  Location: [facility] INVASIVE CV LAB;  Service: Cardiovascular;;  Venous Anastomosis   PERIPHERAL VASCULAR BALLOON ANGIOPLASTY  05/30/2023   Procedure: PERIPHERAL VASCULAR BALLOON ANGIOPLASTY;  Surgeon: [name], MD;  Location: [facility] INVASIVE CV LAB;  Service: Cardiovascular;;  Venous Anastomosis; Distal Intragraft   PERIPHERAL VASCULAR THROMBECTOMY N/A 05/30/2023   Procedure: PERIPHERAL VASCULAR THROMBECTOMY;  Surgeon: [name], MD;  Location: [facility] INVASIVE CV LAB;  Service: Cardiovascular;  Laterality: N/A;   thigh surg     left side due to MRSA   UPPER EXTREMITY VENOGRAPHY Left 10/29/2023   Procedure: UPPER EXTREMITY VENOGRAPHY;  Surgeon: Sheree Penne Lonni, MD;  Location: [facility] INVASIVE CV LAB;  Service: Cardiovascular;  Laterality: Left;   UPPER GASTROINTESTINAL ENDOSCOPY     Family History  Problem Relation Age of Onset   Hypertension Father    Kidney disease Father    Diabetes Brother    Colon cancer Neg Hx    CAD Neg Hx    Rectal cancer Neg Hx    Stomach cancer Neg Hx    Social History   Socioeconomic History   Marital status: Divorced    Spouse name: Not on file   Number of children: 0   Years of education: Not on file   Highest education level: Not on file  Occupational History   Occupation: Retired    Associate Professor: SELF-EMPLOYED  Tobacco Use   Smoking status: Every Day    Current packs/day: 0.50    Average packs/day: 0.5 packs/day for 40.0 years (20.0 ttl pk-yrs)    Types: Cigarettes    Passive exposure: Never   Smokeless tobacco: Never   Tobacco comments:    smokes 1 pack of cigerettes a week- off and on  Vaping Use   Vaping status: Never Used  Substance and Sexual Activity   Alcohol use: Yes    Alcohol/week: 2.0 standard drinks of alcohol    Types: 2 Standard drinks or equivalent per week    Comment: wine and beer- occasionally    Drug use: No   Sexual activity: Never  Other Topics Concern   Not on file  Social History  Narrative   Financial assistance approved for 100% discount at [facility] and has [facility] card per Barnie Potters   02/10/2010      3 caffeine drinks daily    Social Drivers of Health   Financial Resource Strain: Low Risk  (01/30/2018)   Overall Financial Resource Strain (CARDIA)    Difficulty of Paying Living Expenses: Not hard at all  Food Insecurity: No Food Insecurity (01/30/2018)   Hunger Vital Sign    Worried About Running Out of Food in the Last Year: Never true    Ran Out of Food in the Last Year: Never true  Transportation Needs: No Transportation Needs (01/30/2018)   PRAPARE - Administrator, Civil Service (Medical): No    Lack of Transportation (Non-Medical): No  Physical Activity: Sufficiently Active (01/30/2018)   Exercise Vital Sign    Days of Exercise per Week: 7 days    Minutes of Exercise per Session: 30 min  Stress: Stress Concern Present (01/30/2018)   [employer] of Occupational Health - Occupational Stress Questionnaire    Feeling of Stress : To some extent  Social Connections: Moderately Isolated (01/30/2018)   Social Connection and Isolation Panel    Frequency of Communication with Friends and Family: More than three times a week    Frequency of Social Gatherings with Friends and Family: More than three times a week    Attends Religious Services: Never    Database administrator or Organizations: No    Attends Engineer, structural: Never    Marital Status: Divorced    Tobacco Counseling Ready to quit: Not Answered Counseling given: Not Answered Tobacco comments: smokes 1 pack of cigerettes a week- off and on   Clinical Intake:     Pain : No/denies pain     BMI - recorded: 28.26 Nutritional Status: BMI 25 -29 Overweight Nutritional Risks: None Diabetes: No  How often do you need to have someone help you when you read instructions, pamphlets,  or other written materials from your doctor or pharmacy?: 1 - Never          Activities of Daily Living    11/30/2023    9:19 AM 11/09/2023    9:12 AM  In your present state of health, do you have any difficulty performing the following activities:  Hearing? 0 0  Vision? 0 0  Difficulty concentrating or making decisions? 1 0  Walking or climbing stairs? 0   Dressing or bathing? 0   Doing errands, shopping? 0   Preparing Food and eating ? N   Using the Toilet? N   In the past six months, have you accidently leaked urine? Y   Do you have problems with loss of bowel control? N   Managing your Medications? N   Managing your Finances? N   Housekeeping or managing your Housekeeping? N     Patient Care Team: [name], NP as PCP - General (Nurse Practitioner) Verlin Lonni BIRCH, MD as PCP - Cardiology (Cardiology) Octavia Charleston, MD as Consulting Physician (Ophthalmology) Rayburn Pac, MD as Consulting Physician (Nephrology) Delores Ricka Maidens, NP as Nurse Practitioner (Nurse Practitioner) Center, [facility]  Indicate any recent Medical Services you may have received from other than [facility] providers in the past year (date may be approximate).     Assessment:   This is a routine wellness examination for [place].  Hearing/Vision screen Hearing Screening - Comments:: She stated hearing is good Vision Screening - Comments:: Patient stated &quot; as long as she know its doing good, going to the eye doctor after her appointment today &quot;    Goals Addressed   None    Depression Screen    10/15/2023    2:02 PM 05/04/2023    9:37 AM 10/23/2022   10:38 AM 06/28/2021    3:20 PM 06/10/2021   10:07 AM 03/12/2020    8:19 AM 08/22/2019    9:01 AM  PHQ 2/9 Scores  PHQ - 2 Score 0 0 0 0 0 0 0  PHQ- 9 Score 0          Fall Risk    05/04/2023    9:36 AM 02/05/2023    2:27 PM 10/23/2022   10:38 AM 10/16/2022   11:00 AM 06/12/2022    8:59 AM  Fall Risk   Falls in the past year? 1 1 1 1 1   Number falls in past yr: 1 1 1 1 1   Injury  with Fall? 0 0 0 0 0  Risk for fall due to : History of fall(s) History of fall(s) History of fall(s) Impaired balance/gait History of fall(s)  Follow up Falls evaluation completed Falls evaluation completed Falls evaluation completed Falls evaluation completed Falls evaluation completed    MEDICARE RISK AT HOME:    TIMED UP AND GO:  Was the test performed?  No    Cognitive Function:    10/23/2022   10:44 AM 03/07/2019    8:42 AM 01/30/2018    8:52 AM 01/08/2017   10:27 AM 12/13/2016   11:00 AM  MMSE - Mini Mental State Exam  Orientation to time 5 5 5 5  5    Orientation to Place 5 5 5 5  5    Registration 3 3 3 3  3    Attention/ Calculation 3 1 5 5  5    Attention/Calculation-comments mixed up o and r      Recall 0 2 2 1  1    Language- name 2 objects 2  2 2 2  2    Language- repeat 1 1 1 1 1   Language- follow 3 step command 2 3 3 3  3    Language- follow 3 step command-comments took with left hand versus right      Language- read & follow direction 0 1 1 1  1    Language-read & follow direction-comments Did not follow instruction      Write a sentence 1 0 1 1  1    Copy design 0 0 0 0  1   Total score 22 23 28 27  28       Data saved with a previous flowsheet row definition        11/30/2023    8:55 AM 06/28/2021    3:22 PM 03/12/2020    8:26 AM  6CIT Screen  What Year? 0 points 0 points 0 points  What month? 0 points 0 points 0 points  What time? 0 points 0 points 0 points  Count back from 20 0 points 0 points 0 points  Months in reverse 4 points 2 points 4 points  Repeat phrase 2 points 6 points 4 points  Total Score 6 points 8 points 8 points    Immunizations Immunization History  Administered Date(s) Administered   Fluad Trivalent(High Dose 65+) 02/05/2023   Hepatitis B, ADULT 06/21/2017, 07/19/2017, 08/18/2017, 09/11/2019, 10/09/2019   Hepb-cpg 02/12/2020   Influenza Split 02/03/2011, 01/12/2012   Influenza Whole 01/07/2010   Influenza, High Dose Seasonal PF  01/08/2017, 01/22/2018, 01/16/2019, 01/22/2020, 01/18/2021   Influenza, Mdck, Trivalent,PF 6+ MOS(egg free) 02/06/2023   Influenza, Quadrivalent, Recombinant, Inj, Pf 01/31/2022   Influenza,inj,Quad PF,6+ Mos 01/03/2013, 02/12/2014, 01/15/2015, 01/06/2016   Influenza-Unspecified 01/23/2019   Moderna Sars-Covid-2 Vaccination 06/05/2019, 07/03/2019   PFIZER(Purple Top)SARS-COV-2 Vaccination 01/08/2020, 11/08/2020   Pneumococcal Conjugate-13 04/22/2015   Pneumococcal Polysaccharide-23 01/12/2012   Td 11/23/2008   Tdap 04/07/2020   Zoster Recombinant(Shingrix) 02/13/2018, 12/03/2019   Zoster, Live 04/24/2014    TDAP status: Up to date  Flu Vaccine status: Due, Education has been provided regarding the importance of this vaccine. Advised may receive this vaccine at local pharmacy or Health Dept. Aware to provide a copy of the vaccination record if obtained from local pharmacy or Health Dept. Verbalized acceptance and understanding.  Pneumococcal vaccine status: Up to date  Covid-19 vaccine status: Information provided on how to obtain vaccines.   Qualifies for Shingles Vaccine? Yes   Zostavax completed No   Shingrix Completed?: Yes  Screening Tests Health Maintenance  Topic Date Due   OPHTHALMOLOGY EXAM  02/01/2021   Lung Cancer Screening  04/09/2021   COVID-19 Vaccine (5 - 2024-25 season) 12/24/2022   LIPID PANEL  06/13/2023   HEMOGLOBIN A1C  08/23/2023   INFLUENZA VACCINE  11/23/2023   FOOT EXAM  02/05/2024   Medicare Annual Wellness (AWV)  11/29/2024   DTaP/Tdap/Td (3 - Td or Tdap) 04/07/2030   Pneumococcal Vaccine: 50+ Years  Completed   Hepatitis B Vaccines  Completed   DEXA SCAN  Completed   Hepatitis C Screening  Completed   Zoster Vaccines- Shingrix  Completed   HPV VACCINES  Aged Out   Meningococcal B Vaccine  Aged Out   Colonoscopy  Discontinued    Health Maintenance  Health Maintenance Due  Topic Date Due   OPHTHALMOLOGY EXAM  02/01/2021   Lung Cancer  Screening  04/09/2021   COVID-19 Vaccine (5 - 2024-25 season) 12/24/2022   LIPID PANEL  06/13/2023   HEMOGLOBIN A1C  08/23/2023  INFLUENZA VACCINE  11/23/2023    Colorectal cancer screening: No longer required.   Mammogram status: No longer required due to age.  Bone Density status: Completed 11/2021. Results reflect: Bone density results: OSTEOPOROSIS. Repeat every 2 years.  Lung Cancer Screening: (Low Dose CT Chest recommended if Age 4-80 years, 20 pack-year currently smoking OR have quit w/in 15years.) does qualify.   Lung Cancer Screening Referral: completed  Additional Screening:  Hepatitis C Screening: does qualify; Completed   Vision Screening: Recommended annual ophthalmology exams for early detection of glaucoma and other disorders of the eye. Is the patient up to date with their annual eye exam?  Yes  Who is the provider or what is the name of the office in which the patient attends annual eye exams? [facility]  If pt is not established with a provider, would they like to be referred to a provider to establish care? No .   Dental Screening: Recommended annual dental exams for proper oral hygiene  Diabetic Foot Exam: Diabetic Foot Exam: Completed 02/05/2024  Community Resource Referral / Chronic Care Management: CRR required this visit?  No   CCM required this visit?  No     Plan:     I have personally reviewed and noted the following in the patient's chart:   Medical and social history Use of alcohol, tobacco or illicit drugs  Current medications and supplements including opioid prescriptions. Patient is not currently taking opioid prescriptions. Functional ability and status Nutritional status Physical activity Advanced directives List of other physicians Hospitalizations, surgeries, and ER visits in previous 12 months Vitals Screenings to include cognitive, depression, and falls Referrals and appointments  In addition, I have reviewed and discussed with  patient certain preventive protocols, quality metrics, and best practice recommendations. A written personalized care plan for preventive services as well as general preventive health recommendations were provided to patient.     Harlene MARLA An, NP   11/30/2023

## 2023-12-01 LAB — LIPID PANEL
Cholesterol: 140 mg/dL (ref <200)
HDL: 61 mg/dL (ref >=50)
LDL Cholesterol (Calc): 58 mg/dL
Non-HDL Cholesterol (Calc): 79 mg/dL (ref <130)
Total CHOL/HDL Ratio: 2.3 (calc) (ref <5.0)
Triglycerides: 119 mg/dL (ref <150)

## 2023-12-01 LAB — HEMOGLOBIN A1C
Hgb A1c MFr Bld: 4.7 % (ref <5.7)
Mean Plasma Glucose: 88 mg/dL
eAG (mmol/L): 4.9 mmol/L

## 2023-12-03 ENCOUNTER — Ambulatory Visit: Payer: Self-pay | Admitting: Nurse Practitioner

## 2023-12-06 ENCOUNTER — Encounter (HOSPITAL_COMMUNITY)
Admission: RE | Disposition: A | Discharge status: Home / Self Care | Payer: Self-pay | Source: Home / Self Care | Attending: Vascular Surgery

## 2023-12-06 ENCOUNTER — Ambulatory Visit

## 2023-12-06 ENCOUNTER — Other Ambulatory Visit: Payer: Self-pay

## 2023-12-06 ENCOUNTER — Ambulatory Visit (HOSPITAL_COMMUNITY)
Admission: RE | Admit: 2023-12-06 | Discharge: 2023-12-06 | Disposition: A | Discharge status: Home / Self Care | Attending: Vascular Surgery | Admitting: Vascular Surgery

## 2023-12-06 DIAGNOSIS — N186 End stage renal disease: Secondary | ICD-10-CM | POA: Diagnosis present

## 2023-12-06 DIAGNOSIS — F17210 Nicotine dependence, cigarettes, uncomplicated: Secondary | ICD-10-CM | POA: Insufficient documentation

## 2023-12-06 DIAGNOSIS — T8249XA Other complication of vascular dialysis catheter, initial encounter: Secondary | ICD-10-CM

## 2023-12-06 DIAGNOSIS — T8241XA Breakdown (mechanical) of vascular dialysis catheter, initial encounter: Secondary | ICD-10-CM | POA: Diagnosis not present

## 2023-12-06 DIAGNOSIS — I120 Hypertensive chronic kidney disease with stage 5 chronic kidney disease or end stage renal disease: Secondary | ICD-10-CM | POA: Insufficient documentation

## 2023-12-06 DIAGNOSIS — Z992 Dependence on renal dialysis: Secondary | ICD-10-CM | POA: Diagnosis not present

## 2023-12-06 DIAGNOSIS — E1122 Type 2 diabetes mellitus with diabetic chronic kidney disease: Secondary | ICD-10-CM | POA: Insufficient documentation

## 2023-12-06 HISTORY — PX: TUNNELLED CATHETER EXCHANGE: CATH118373

## 2023-12-06 LAB — GLUCOSE, CAPILLARY: Glucose-Capillary: 101 mg/dL — ABNORMAL HIGH (ref 70–99)

## 2023-12-06 SURGERY — TUNNELLED CATHETER EXCHANGE
Anesthesia: LOCAL | Site: Chest

## 2023-12-06 MED ORDER — MIDAZOLAM HCL 2 MG/2ML IJ SOLN
INTRAMUSCULAR | Status: AC
  Filled 2023-12-06: qty 2

## 2023-12-06 MED ORDER — MIDAZOLAM HCL 2 MG/2ML IJ SOLN
INTRAMUSCULAR | Status: DC | PRN
  Administered 2023-12-06: 1 mg via INTRAVENOUS

## 2023-12-06 MED ORDER — FENTANYL CITRATE (PF) 100 MCG/2ML IJ SOLN
INTRAMUSCULAR | Status: DC | PRN
  Administered 2023-12-06: 50 ug via INTRAVENOUS

## 2023-12-06 MED ORDER — LIDOCAINE HCL (PF) 1 % IJ SOLN
INTRAMUSCULAR | Status: AC
  Filled 2023-12-06: qty 30

## 2023-12-06 MED ORDER — HEPARIN SODIUM (PORCINE) 1000 UNIT/ML IJ SOLN
INTRAMUSCULAR | Status: DC | PRN
  Administered 2023-12-06 (×2): 1600 [IU] via INTRAVENOUS

## 2023-12-06 MED ORDER — HEPARIN SODIUM (PORCINE) 1000 UNIT/ML IJ SOLN
INTRAMUSCULAR | Status: AC
  Filled 2023-12-06: qty 10

## 2023-12-06 MED ORDER — HEPARIN (PORCINE) IN NACL 1000-0.9 UT/500ML-% IV SOLN
INTRAVENOUS | Status: DC | PRN
  Administered 2023-12-06: 500 mL

## 2023-12-06 MED ORDER — FENTANYL CITRATE (PF) 100 MCG/2ML IJ SOLN
INTRAMUSCULAR | Status: AC
  Filled 2023-12-06: qty 2

## 2023-12-06 MED ORDER — LIDOCAINE HCL (PF) 1 % IJ SOLN
INTRAMUSCULAR | Status: DC | PRN
  Administered 2023-12-06 (×2): 15 mL via INTRADERMAL

## 2023-12-06 SURGICAL SUPPLY — 3 items
CATH PALINDROME-P 19CM W/VT (CATHETERS) IMPLANT
GLIDEWIRE STIFF .35X180X3 HYDR (WIRE) IMPLANT
TRAY PV CATH (CUSTOM PROCEDURE TRAY) ×1 IMPLANT

## 2023-12-06 NOTE — Op Note (Signed)
    Patient name: [name] MRN: [id number] DOB: [birth year] Sex: female  12/06/2023 Pre-operative Diagnosis: ESRD, malfunction right IJ tunneled dialysis catheter Post-operative diagnosis:  Same Surgeon:  Penne BROCKS. Sheree, MD Procedure Performed: 1.  Exchange of right IJ tunneled dialysis catheter with 19 cm palindrome under fluoroscopic guidance 2.  Moderate sedation with fentanyl  and Versed  for 16 minutes  Indications: 78 year old female has end-stage renal disease currently dialyzing via right IJ tunneled dialysis catheter which has been in place approximately 3 months.  She does have a new right upper arm AV graft which they can begin to cannulate at this time.  In the interim she will need a tunneled dialysis catheter which is not functioning properly and we are planning to exchange today.  Findings: New catheter was placed to the SVC atrial junction and did flush and withdrawal blood easily and was locked with 1.6 cc of concentrated heparin  in both ports.  Dialysis can begin to cannulate the right upper arm AV graft and after 1 week of successful cannulation the [facility] can be removed.   Procedure:  The patient was identified in the holding area and taken to the heart and vascular procedure room.  She was sterilely prepped and draped in the right neck and chest to the existing catheter site and a timeout was called.  Moderate sedation with fentanyl  and Versed  was administered through the catheter.  We began by anesthetizing the tunneling site with 1% lidocaine .  The cuff was then dissected out from the catheter and when this was free the catheter was then wired under fluoroscopic guidance and the wire was placed into the IVC.  The catheter was then removed and pressure was held.  A new catheter was placed to the SVC atrial junction and the wire and stylette's were removed.  The catheter did flush and withdraw heparinized saline easily and was affixed to the skin with nylon suture.  It was  locked with 1.6 cc of concentrated heparin  both ports and a sterile dressing was applied.  She tolerated the procedure well or may complication.    Saif Peter C. Sheree, MD [facility] Office: [phone] Pager: [phone]

## 2023-12-06 NOTE — H&P (Signed)
 H+P  History of Present Illness: This is a 78 y.o. female with end-stage renal disease she has been dialyzing via right IJ tunneled dialysis catheter and recently underwent right brachial artery to axillary vein AV graft placement just under 1 month ago.  She is having difficulty with the catheter.  There is a small seroma at the site of the graft.  There appears the catheter was placed in approximately May of this year.  Past Medical History:  Diagnosis Date   Anemia    Anxiety    Arthritis    lower back and knees    Asthma    childhood   Barrett's esophagus    CAD (coronary artery disease)    Chronic kidney disease    Coagulation defect (HCC)    Depression    Diabetes mellitus    Dysphagia    Eczema    End stage renal disease (HCC)    Fluid overload, unspecified    History of herpes zoster 02/2010   Recovered fully after period of acute herpetic neuralgia tx w/ gabapentin .    Hx MRSA infection 2009   Hypercalcemia    Hyperlipidemia    Hypertension    Mitral regurgitation    moderate-severe MR 08/11/22 echo   Moderate protein-calorie malnutrition (HCC)    Neuromuscular disorder (HCC)    chronic pain   Personal history of colonic polyps 10/17/2010   hyperplastic   Stroke [facility])     Past Surgical History:  Procedure Laterality Date   A/V FISTULAGRAM N/A 05/11/2023   Procedure: A/V Fistulagram;  Surgeon: [name], MD;  Location: [facility] INVASIVE CV LAB;  Service: Cardiovascular;  Laterality: N/A;   ABDOMINAL HYSTERECTOMY     unclear when   AV FISTULA PLACEMENT Left 02/13/2017   Procedure: ARTERIOVENOUS (AV) GORE-TEX STRETCH GRAFT INSERTION INTO LEFT ARM;  Surgeon: Harvey Carlin BRAVO, MD;  Location: [facility] OR;  Service: Vascular;  Laterality: Left;   AV FISTULA PLACEMENT Right 01/16/2022   Procedure: RIGHT ARM ARTERIOVENOUS (AV) FISTULA;  Surgeon: [name], MD;  Location: [facility] OR;  Service: Vascular;  Laterality: Right;   AV FISTULA PLACEMENT Right 09/01/2022    Procedure: INSERTION OF ARTERIOVENOUS (AV) GORE-TEX GRAFT RIGHT ARM;  Surgeon: Sheree Penne Lonni, MD;  Location: [facility] OR;  Service: Vascular;  Laterality: Right;   CATARACT EXTRACTION Bilateral    COLONOSCOPY     INSERTION OF ARTERIOVENOUS (AV) ARTEGRAFT ARM Right 11/09/2023   Procedure: INSERTION BRACHIAL ARTERY TO AXILLARY VEIN ARTERIOVENOUS GRAFT, UPPER EXTREMITY;  Surgeon: Lanis Fonda BRAVO, MD;  Location: [facility] OR;  Service: Vascular;  Laterality: Right;   IR FLUORO GUIDE CV LINE RIGHT  01/09/2022   IR FLUORO GUIDE CV LINE RIGHT  09/03/2023   IR US  GUIDE VASC ACCESS RIGHT  01/09/2022   IR US  GUIDE VASC ACCESS RIGHT  09/03/2023   OTHER SURGICAL HISTORY     Catherer placed by [facility], right upper chest area.   PERIPHERAL VASCULAR BALLOON ANGIOPLASTY  05/11/2023   Procedure: PERIPHERAL VASCULAR BALLOON ANGIOPLASTY;  Surgeon: [name], MD;  Location: [facility] INVASIVE CV LAB;  Service: Cardiovascular;;  Venous Anastomosis   PERIPHERAL VASCULAR BALLOON ANGIOPLASTY  05/30/2023   Procedure: PERIPHERAL VASCULAR BALLOON ANGIOPLASTY;  Surgeon: [name], MD;  Location: [facility] INVASIVE CV LAB;  Service: Cardiovascular;;  Venous Anastomosis; Distal Intragraft   PERIPHERAL VASCULAR THROMBECTOMY N/A 05/30/2023   Procedure: PERIPHERAL VASCULAR THROMBECTOMY;  Surgeon: [name], MD;  Location: [facility] INVASIVE CV LAB;  Service:  Cardiovascular;  Laterality: N/A;   thigh surg     left side due to MRSA   UPPER EXTREMITY VENOGRAPHY Left 10/29/2023   Procedure: UPPER EXTREMITY VENOGRAPHY;  Surgeon: [name], MD;  Location: [facility] INVASIVE CV LAB;  Service: Cardiovascular;  Laterality: Left;   UPPER GASTROINTESTINAL ENDOSCOPY      Allergies  Allergen Reactions   Lisinopril  Other (See Comments)    Abnormal Kidney Function    Pioglitazone Other (See Comments)    Desquamation of skin of the palm   Morphine And Codeine Nausea And Vomiting   Sulfonamide Derivatives Rash    Prior to Admission  medications   Medication Sig Start Date End Date Taking? Authorizing Provider  acetaminophen  (TYLENOL ) 500 MG tablet Take 1,000 mg by mouth every 8 (eight) hours as needed for headache.    [provider]  aspirin  81 MG chewable tablet Chew 1 tablet (81 mg total) by mouth daily. 05/12/17   Regalado, Belkys A, MD  calcium  acetate (PHOSLO) 667 MG capsule Take 1,334 mg by mouth 3 (three) times daily with meals.    [provider]  camphor-menthol VIKKI) lotion Apply 1 Application topically as needed for itching. 10/15/23   Medina-Vargas, Monina C, NP  Cinacalcet  HCl (SENSIPAR  PO) Take 1 capsule by mouth Every Tuesday,Thursday,and Saturday with dialysis.    [provider]  clopidogrel  (PLAVIX ) 75 MG tablet TAKE 1 TABLET BY MOUTH EVERY DAY 11/26/23   Eubanks, Jessica K, NP  ezetimibe  (ZETIA ) 10 MG tablet TAKE 1 TABLET BY MOUTH EVERY DAY 09/24/23   Fargo, Amy E, NP  folic acid -vitamin b complex-vitamin c-selenium-zinc  (DIALYVITE) 3 MG TABS tablet Take 1 tablet by mouth daily.    [provider]  latanoprost  (XALATAN ) 0.005 % ophthalmic solution Place 1 drop into both eyes at bedtime.    [provider]  pantoprazole  (PROTONIX ) 40 MG tablet Take 1 tablet (40 mg total) by mouth daily. 11/30/23   Eubanks, Jessica K, NP  pravastatin  (PRAVACHOL ) 40 MG tablet TAKE 1 TABLET BY MOUTH EVERYDAY AT BEDTIME 09/24/23   Fargo, Amy E, NP  sertraline  (ZOLOFT ) 50 MG tablet Take 1 tablet (50 mg total) by mouth daily. 11/26/23   [name], NP  sevelamer (RENAGEL) 800 MG tablet Take 2,400 mg by mouth 3 (three) times daily with meals.    [provider]  traZODone  (DESYREL ) 100 MG tablet TAKE 1 TABLET BY MOUTH EVERYDAY AT BEDTIME 09/24/23   Fargo, Amy E, NP  triamcinolone  cream (KENALOG ) 0.1 % Apply topically 3 (three) times daily as needed. DX L30.9 MUST KEEP APPT WITH DERMATOLOGY 10/29/23   [name], NP    Social History   Socioeconomic History   Marital  status: Divorced    Spouse name: Not on file   Number of children: 0   Years of education: Not on file   Highest education level: Not on file  Occupational History   Occupation: Retired    Associate Professor: SELF-EMPLOYED  Tobacco Use   Smoking status: Every Day    Current packs/day: 0.50    Average packs/day: 0.5 packs/day for 40.0 years (20.0 ttl pk-yrs)    Types: Cigarettes    Passive exposure: Never   Smokeless tobacco: Never   Tobacco comments:    smokes 1 pack of cigerettes a week- off and on  Vaping Use   Vaping status: Never Used  Substance and Sexual Activity   Alcohol use: Yes    Alcohol/week: 2.0 standard drinks of alcohol  Types: 2 Standard drinks or equivalent per week    Comment: wine and beer- occasionally    Drug use: No   Sexual activity: Never  Other Topics Concern   Not on file  Social History Narrative   Financial assistance approved for 100% discount at [facility] and has [facility] card per Barnie Potters   02/10/2010      3 caffeine drinks daily    Social Drivers of Health   Financial Resource Strain: Low Risk  (01/30/2018)   Overall Financial Resource Strain (CARDIA)    Difficulty of Paying Living Expenses: Not hard at all  Food Insecurity: No Food Insecurity (01/30/2018)   Hunger Vital Sign    Worried About Running Out of Food in the Last Year: Never true    Ran Out of Food in the Last Year: Never true  Transportation Needs: No Transportation Needs (01/30/2018)   PRAPARE - Administrator, Civil Service (Medical): No    Lack of Transportation (Non-Medical): No  Physical Activity: Sufficiently Active (01/30/2018)   Exercise Vital Sign    Days of Exercise per Week: 7 days    Minutes of Exercise per Session: 30 min  Stress: Stress Concern Present (01/30/2018)   [employer] of Occupational Health - Occupational Stress Questionnaire    Feeling of Stress : To some extent  Social Connections: Moderately Isolated (01/30/2018)   Social Connection and  Isolation Panel    Frequency of Communication with Friends and Family: More than three times a week    Frequency of Social Gatherings with Friends and Family: More than three times a week    Attends Religious Services: Never    Database administrator or Organizations: No    Attends Banker Meetings: Never    Marital Status: Divorced  Catering manager Violence: Not At Risk (01/30/2018)   Humiliation, Afraid, Rape, and Kick questionnaire    Fear of Current or Ex-Partner: No    Emotionally Abused: No    Physically Abused: No    Sexually Abused: No     Family History  Problem Relation Age of Onset   Hypertension Father    Kidney disease Father    Diabetes Brother    Colon cancer Neg Hx    CAD Neg Hx    Rectal cancer Neg Hx    Stomach cancer Neg Hx     ROS: No new issues  Physical Examination  Vitals:   12/06/23 0818 12/06/23 0827  BP: (!) 155/80 (!) 155/80  Pulse: 86 84  Resp: 12 12  Temp: 98.1 F (36.7 C)   SpO2: 92% 94%   There is no height or weight on file to calculate BMI.  Awake alert and oriented There is a right IJ tunnel catheter in place without evidence of infection Right upper arm AV graft has a very strong thrill there is a small seroma near the arterial anastomosis  CBC    Component Value Date/Time   WBC 7.8 09/01/2023 0821   RBC 3.50 (L) 09/01/2023 0821   HGB 11.9 (L) 11/09/2023 0909   HGB 11.2 08/27/2015 0937   HCT 35.0 (L) 11/09/2023 0909   HCT 33.7 (L) 08/27/2015 0937   PLT 198 09/01/2023 0821   PLT 278 08/27/2015 0937   MCV 100.3 (H) 09/01/2023 0821   MCV 86 08/27/2015 0937   MCH 32.9 09/01/2023 0821   MCHC 32.8 09/01/2023 0821   RDW 15.1 09/01/2023 0821   RDW 15.4 08/27/2015 0937   LYMPHSABS  1.4 09/01/2023 0821   LYMPHSABS 2.1 08/27/2015 0937   MONOABS 0.6 09/01/2023 0821   EOSABS 0.6 (H) 09/01/2023 0821   EOSABS 0.4 08/27/2015 0937   BASOSABS 0.1 09/01/2023 0821   BASOSABS 0.1 08/27/2015 0937    BMET    Component  Value Date/Time   NA 137 11/09/2023 0909   NA 142 09/20/2017 0000   K 5.1 11/09/2023 0909   CL 110 11/09/2023 0909   CO2 29 09/01/2023 0821   GLUCOSE 84 11/09/2023 0909   BUN 30 (H) 11/09/2023 0909   BUN 20 09/08/2015 0859   CREATININE 8.70 (H) 11/09/2023 0909   CREATININE 6.33 (H) 09/28/2016 0903   CALCIUM  8.8 (L) 09/01/2023 0821   CALCIUM  7.9 (L) 03/09/2017 0857   GFRNONAA 4 (L) 09/01/2023 0821   GFRNONAA 6 (L) 09/28/2016 0903   GFRAA 7 (L) 07/30/2018 1311   GFRAA 7 (L) 09/28/2016 0903    COAGS: Lab Results  Component Value Date   INR 0.98 12/02/2016   INR 0.95 07/14/2016     No new studies   ASSESSMENT/PLAN: This is a 78 y.o. female with end-stage renal disease on dialysis via right IJ tunnel catheter.  She does have a graft that appears to be functioning well and should be okay to begin using.  Given that she has malfunction of the catheter we will replace this today so that she has a bridge or in case the graft does not initially work she will have a backup plan.  She does have a small seroma at the graft arterial anastomosis but this does not appear to be infected and should not impede cannulation.  Plan for catheter exchange today.  Tayler Lassen C. Sheree, MD [facility] Office: [phone] Pager: [phone]

## 2023-12-07 ENCOUNTER — Other Ambulatory Visit: Payer: Self-pay

## 2023-12-07 ENCOUNTER — Emergency Department (HOSPITAL_COMMUNITY)

## 2023-12-07 ENCOUNTER — Encounter (HOSPITAL_COMMUNITY): Payer: Self-pay

## 2023-12-07 ENCOUNTER — Ambulatory Visit: Payer: Self-pay

## 2023-12-07 ENCOUNTER — Emergency Department (HOSPITAL_COMMUNITY): Admission: EM | Admit: 2023-12-07 | Discharge: 2023-12-07 | Disposition: A | Discharge status: Home / Self Care

## 2023-12-07 DIAGNOSIS — I5030 Unspecified diastolic (congestive) heart failure: Secondary | ICD-10-CM | POA: Diagnosis not present

## 2023-12-07 DIAGNOSIS — Z7982 Long term (current) use of aspirin: Secondary | ICD-10-CM | POA: Diagnosis not present

## 2023-12-07 DIAGNOSIS — J810 Acute pulmonary edema: Secondary | ICD-10-CM | POA: Insufficient documentation

## 2023-12-07 DIAGNOSIS — E875 Hyperkalemia: Secondary | ICD-10-CM | POA: Diagnosis not present

## 2023-12-07 DIAGNOSIS — E1122 Type 2 diabetes mellitus with diabetic chronic kidney disease: Secondary | ICD-10-CM | POA: Diagnosis not present

## 2023-12-07 DIAGNOSIS — R0602 Shortness of breath: Secondary | ICD-10-CM | POA: Diagnosis present

## 2023-12-07 DIAGNOSIS — I2510 Atherosclerotic heart disease of native coronary artery without angina pectoris: Secondary | ICD-10-CM | POA: Diagnosis not present

## 2023-12-07 DIAGNOSIS — N186 End stage renal disease: Secondary | ICD-10-CM | POA: Diagnosis not present

## 2023-12-07 DIAGNOSIS — Z992 Dependence on renal dialysis: Secondary | ICD-10-CM | POA: Insufficient documentation

## 2023-12-07 LAB — CBC WITH DIFFERENTIAL/PLATELET
Abs Immature Granulocytes: 0.01 K/uL (ref 0.00–0.07)
Basophils Absolute: 0.1 K/uL (ref 0.0–0.1)
Basophils Relative: 1 %
Eosinophils Absolute: 0.3 K/uL (ref 0.0–0.5)
Eosinophils Relative: 5 %
HCT: 30.1 % — ABNORMAL LOW (ref 36.0–46.0)
Hemoglobin: 9.3 g/dL — ABNORMAL LOW (ref 12.0–15.0)
Immature Granulocytes: 0 %
Lymphocytes Relative: 17 %
Lymphs Abs: 1.2 K/uL (ref 0.7–4.0)
MCH: 29.8 pg (ref 26.0–34.0)
MCHC: 30.9 g/dL (ref 30.0–36.0)
MCV: 96.5 fL (ref 80.0–100.0)
Monocytes Absolute: 0.6 K/uL (ref 0.1–1.0)
Monocytes Relative: 8 %
Neutro Abs: 4.7 K/uL (ref 1.7–7.7)
Neutrophils Relative %: 69 %
Platelets: 227 K/uL (ref 150–400)
RBC: 3.12 MIL/uL — ABNORMAL LOW (ref 3.87–5.11)
RDW: 18.3 % — ABNORMAL HIGH (ref 11.5–15.5)
WBC: 6.9 K/uL (ref 4.0–10.5)
nRBC: 0 % (ref 0.0–0.2)

## 2023-12-07 LAB — COMPREHENSIVE METABOLIC PANEL WITH GFR
ALT: 13 U/L (ref 0–44)
AST: 18 U/L (ref 15–41)
Albumin: 3.2 g/dL — ABNORMAL LOW (ref 3.5–5.0)
Alkaline Phosphatase: 87 U/L (ref 38–126)
Anion gap: 15 (ref 5–15)
BUN: 80 mg/dL — ABNORMAL HIGH (ref 8–23)
CO2: 24 mmol/L (ref 22–32)
Calcium: 8.8 mg/dL — ABNORMAL LOW (ref 8.9–10.3)
Chloride: 100 mmol/L (ref 98–111)
Creatinine, Ser: 13.72 mg/dL — ABNORMAL HIGH (ref 0.44–1.00)
GFR, Estimated: 3 mL/min — ABNORMAL LOW (ref >60)
Glucose, Bld: 91 mg/dL (ref 70–99)
Potassium: 5.8 mmol/L — ABNORMAL HIGH (ref 3.5–5.1)
Sodium: 139 mmol/L (ref 135–145)
Total Bilirubin: 0.5 mg/dL (ref 0.0–1.2)
Total Protein: 6.2 g/dL — ABNORMAL LOW (ref 6.5–8.1)

## 2023-12-07 LAB — HEPATITIS B SURFACE ANTIGEN: Hepatitis B Surface Ag: NONREACTIVE

## 2023-12-07 LAB — RENAL FUNCTION PANEL
Albumin: 3.3 g/dL — ABNORMAL LOW (ref 3.5–5.0)
Anion gap: 12 (ref 5–15)
BUN: 25 mg/dL — ABNORMAL HIGH (ref 8–23)
CO2: 27 mmol/L (ref 22–32)
Calcium: 8.6 mg/dL — ABNORMAL LOW (ref 8.9–10.3)
Chloride: 95 mmol/L — ABNORMAL LOW (ref 98–111)
Creatinine, Ser: 4.92 mg/dL — ABNORMAL HIGH (ref 0.44–1.00)
GFR, Estimated: 9 mL/min — ABNORMAL LOW
Glucose, Bld: 77 mg/dL (ref 70–99)
Phosphorus: 1.6 mg/dL — ABNORMAL LOW (ref 2.5–4.6)
Potassium: 3 mmol/L — ABNORMAL LOW (ref 3.5–5.1)
Sodium: 134 mmol/L — ABNORMAL LOW (ref 135–145)

## 2023-12-07 LAB — TROPONIN I (HIGH SENSITIVITY)
Troponin I (High Sensitivity): 50 ng/L — ABNORMAL HIGH (ref <18)
Troponin I (High Sensitivity): 44 ng/L — ABNORMAL HIGH (ref <18)

## 2023-12-07 MED ORDER — ALTEPLASE 2 MG IJ SOLR: 2.0000 mg | Freq: Once | INTRAMUSCULAR | Status: DC | PRN

## 2023-12-07 MED ORDER — HEPARIN SODIUM (PORCINE) 1000 UNIT/ML IJ SOLN
INTRAMUSCULAR | Status: AC
  Filled 2023-12-07: qty 4

## 2023-12-07 MED ORDER — HEPARIN SODIUM (PORCINE) 1000 UNIT/ML DIALYSIS
1000.0000 [IU] | INTRAMUSCULAR | Status: DC | PRN
  Administered 2023-12-07: 1000 [IU]

## 2023-12-07 MED ORDER — CHLORHEXIDINE GLUCONATE CLOTH 2 % EX PADS: 6.0000 | MEDICATED_PAD | Freq: Every day | CUTANEOUS | Status: DC

## 2023-12-07 MED ORDER — NEPRO/CARBSTEADY PO LIQD: 237.0000 mL | ORAL | Status: DC | PRN

## 2023-12-07 NOTE — ED Notes (Signed)
 Patient transported to HD

## 2023-12-07 NOTE — ED Notes (Signed)
 ..  The patient is A&OX4, ambulatory at d/c with independent steady gait, NAD. Pt verbalized understanding of d/c instructions and follow up care.

## 2023-12-07 NOTE — ED Notes (Signed)
 The patient has returned from dialysis. Dr. Dean at bedside.

## 2023-12-07 NOTE — Progress Notes (Signed)
   12/07/23 1516  Vitals  Temp (!) 97.5 F (36.4 C)  Pulse Rate 88  Resp 17  BP 129/74  SpO2 98 %  O2 Device Nasal Cannula  Weight  (unable to get bed weight in ed bed)  Oxygen Therapy  O2 Flow Rate (L/min) 2 L/min  Pulse Oximetry Type Continuous  Oximetry Probe Site Changed No  Post Treatment  Dialyzer Clearance Lightly streaked  Liters Processed 78  Fluid Removed (mL) 2500 mL  Tolerated HD Treatment Yes   Received patient in bed to unit.  Alert and oriented.  Informed consent signed and in chart.   TX duration:3.25  Patient tolerated well.  Transported back to the room  Alert, without acute distress.  Hand-off given to patient's nurse.   Access used: Yes Access issues: No  Total UF removed: 2500 Medication(s) given: See MAR Post HD VS: See Above Grid Post HD weight: Unable to get a weight,D/T ED bed.   Zebedee DELENA [facility]

## 2023-12-07 NOTE — ED Provider Notes (Signed)
 Pt comes back from dialysis and feels much better.  She is 100% on RA.  She is stable for d/c.  Return if worse.    Dean Clarity, MD 12/07/23 1630

## 2023-12-07 NOTE — Telephone Encounter (Signed)
 Noted

## 2023-12-07 NOTE — Progress Notes (Signed)
 HPI:  78 yo female with PMH significant for HTN, DM, MR, and ESKD on HD TTS at [facility] presented to [facility] ED today c/o chest pain and SOB after she missed HD yesterday to have a new RIJ TDC exchange.  In the ED, Temp 97.7 Bp 169/83, HR 87, RR 28, SpO2 99% on 2L [place].  Labs notable for Hgb 9.3, K 5.8, BUN 80, Cr 12.72.  We were asked to provide urgent HD for pulmonary edema and hyperkalemia.  The plan is for HD here then return to the ED for possible discharge home. O:BP 113/70   Pulse 84   Temp 97.7 F (36.5 C)   Resp 19   Ht 5' 5 (1.651 m)   Wt 76.7 kg   SpO2 100%   BMI 28.14 kg/m  No intake or output data in the 24 hours ending 12/07/23 1352 Intake/Output: No intake/output data recorded.  Intake/Output this shift:  No intake/output data recorded. Weight change:  Gen: NAD CVS: RRR Resp:CTA Abd: _BS, soft, NT/ND Ext: no edema, RUE AVG +T/B  Recent Labs  Lab 12/07/23 0903  NA 139  K 5.8*  CL 100  CO2 24  GLUCOSE 91  BUN 80*  CREATININE 13.72*  ALBUMIN  3.2*  CALCIUM  8.8*  AST 18  ALT 13   Liver Function Tests: Recent Labs  Lab 12/07/23 0903  AST 18  ALT 13  ALKPHOS 87  BILITOT 0.5  PROT 6.2*  ALBUMIN  3.2*   No results for input(s): LIPASE, AMYLASE in the last 168 hours. No results for input(s): AMMONIA in the last 168 hours. CBC: Recent Labs  Lab 12/07/23 0903  WBC 6.9  NEUTROABS 4.7  HGB 9.3*  HCT 30.1*  MCV 96.5  PLT 227   Cardiac Enzymes: No results for input(s): CKTOTAL, CKMB, CKMBINDEX, TROPONINI in the last 168 hours. CBG: Recent Labs  Lab 12/06/23 0822  GLUCAP 101*    Iron Studies: No results for input(s): IRON, TIBC, TRANSFERRIN, FERRITIN in the last 72 hours. Studies/Results: DG Chest Portable 1 View Result Date: 12/07/2023 EXAM: 1 VIEW XRAY OF THE CHEST 12/07/2023 09:00:00 AM COMPARISON: AP radiograph of the chest dated 01/08/2022. CLINICAL HISTORY: Chest pain; Per triage notes: Patient had a dialysis catheter placed  right chest yesterday and woke up today with chest pain that is reproducible and feeling weak and lethargic. Patient appears in no distress. Patient last dialysis was on Tuesday. FINDINGS: LUNGS AND PLEURA: Hazy and reticular opacities present within the lower lung zones bilaterally. No pleural effusion. No pneumothorax. HEART AND MEDIASTINUM: No acute abnormality of the cardiac and mediastinal silhouettes. BONES AND SOFT TISSUES: No acute osseous abnormality. A right internal jugular hemodialysis catheter has been placed and it appears in appropriate position. IMPRESSION: 1. Hazy and reticular opacities in the lower lung zones bilaterally. 2. Right internal jugular hemodialysis catheter in appropriate position. Electronically signed by: evalene coho 12/07/2023 09:15 AM EDT RP Workstation: [id number]   PERIPHERAL VASCULAR CATHETERIZATION Result Date: 12/06/2023 Images from the original result were not included. Patient name: [name] MRN: [id number] DOB: Oct 03, 1945 Sex: female 12/06/2023 Pre-operative Diagnosis: ESRD, malfunction right IJ tunneled dialysis catheter Post-operative diagnosis:  Same Surgeon:  Penne BROCKS. Sheree, MD Procedure Performed: 1.  Exchange of right IJ tunneled dialysis catheter with 19 cm palindrome under fluoroscopic guidance 2.  Moderate sedation with fentanyl  and Versed  for 16 minutes Indications: 78 year old female has end-stage renal disease currently dialyzing via right IJ tunneled dialysis catheter which has been in place approximately 3  months.  She does have a new right upper arm AV graft which they can begin to cannulate at this time.  In the interim she will need a tunneled dialysis catheter which is not functioning properly and we are planning to exchange today. Findings: New catheter was placed to the SVC atrial junction and did flush and withdrawal blood easily and was locked with 1.6 cc of concentrated heparin  in both ports. Dialysis can begin to cannulate the right  upper arm AV graft and after 1 week of successful cannulation the [facility] can be removed.  Procedure:  The patient was identified in the holding area and taken to the heart and vascular procedure room.  She was sterilely prepped and draped in the right neck and chest to the existing catheter site and a timeout was called.  Moderate sedation with fentanyl  and Versed  was administered through the catheter.  We began by anesthetizing the tunneling site with 1% lidocaine .  The cuff was then dissected out from the catheter and when this was free the catheter was then wired under fluoroscopic guidance and the wire was placed into the IVC.  The catheter was then removed and pressure was held.  A new catheter was placed to the SVC atrial junction and the wire and stylette's were removed.  The catheter did flush and withdraw heparinized saline easily and was affixed to the skin with nylon suture.  It was locked with 1.6 cc of concentrated heparin  both ports and a sterile dressing was applied.  She tolerated the procedure well or may complication. Brandon C. Sheree, MD [facility] Office: [phone] Pager: [phone]    Chlorhexidine  Gluconate Cloth  6 each Topical Q0600    BMET    Component Value Date/Time   NA 139 12/07/2023 0903   NA 142 09/20/2017 0000   K 5.8 (H) 12/07/2023 0903   CL 100 12/07/2023 0903   CO2 24 12/07/2023 0903   GLUCOSE 91 12/07/2023 0903   BUN 80 (H) 12/07/2023 0903   BUN 20 09/08/2015 0859   CREATININE 13.72 (H) 12/07/2023 0903   CREATININE 6.33 (H) 09/28/2016 0903   CALCIUM  8.8 (L) 12/07/2023 0903   CALCIUM  7.9 (L) 03/09/2017 0857   GFRNONAA 3 (L) 12/07/2023 0903   GFRNONAA 6 (L) 09/28/2016 0903   GFRAA 7 (L) 07/30/2018 1311   GFRAA 7 (L) 09/28/2016 0903   CBC    Component Value Date/Time   WBC 6.9 12/07/2023 0903   RBC 3.12 (L) 12/07/2023 0903   HGB 9.3 (L) 12/07/2023 0903   HGB 11.2 08/27/2015 0937   HCT 30.1 (L) 12/07/2023 0903   HCT  33.7 (L) 08/27/2015 0937   PLT 227 12/07/2023 0903   PLT 278 08/27/2015 0937   MCV 96.5 12/07/2023 0903   MCV 86 08/27/2015 0937   MCH 29.8 12/07/2023 0903   MCHC 30.9 12/07/2023 0903   RDW 18.3 (H) 12/07/2023 0903   RDW 15.4 08/27/2015 0937   LYMPHSABS 1.2 12/07/2023 0903   LYMPHSABS 2.1 08/27/2015 0937   MONOABS 0.6 12/07/2023 0903   EOSABS 0.3 12/07/2023 0903   EOSABS 0.4 08/27/2015 0937   BASOSABS 0.1 12/07/2023 0903   BASOSABS 0.1 08/27/2015 0937   DIALYSIS PRESCRIPTION - Conventional Hemodialysis SWKG TTS Data Element Value  Order Date/Time October 27, 2023  Frequency 3X Week  Treatment Days TueThuSat  Dialyzer 180NRe Optiflux  Treatment Time (Total Minutes) 210 min  Blood Flow Rate (mL/min) 400 mL/min  Dialysate Flow Rate Manual 500  Estimated Dry Weight 75.3 kg  Dialysate Concentrate 2.0 K, 2.0 Ca, 1.0 Mg, 100 Dextrose  (G2201)  Sodium (mEq/L) 137 mEq/L  Bicarb Machine Setting (mEq/L) 35 mEq/L  Dialysis Access Hemodialysis-AV Fistula-Standard, Right Upper Arm  Arterial Needle Size 15g1  Venous Needle Size 15g1    Assessment/Plan:  Hyperkalemia - due to missed HD.  Currently on HD  HTN/volume - volume overloaded.  Plan for UF with HD then return to ED to be re-evaluated. ESRD - pt to have HD today to make up for missed HD yesterday.  She will need to f/u tomorrow to her outpatient unit for regularly scheduled HD. Vascular access - s/p RIJ TDC exchange with some chest wall hematoma and pain.  F/u with [facility] Disposition - to return to ED for likely discharge home after HD.   Fairy RONAL Sellar, MD [employer] [phone]

## 2023-12-07 NOTE — ED Notes (Signed)
Patient ambulatory to bathroom without assistance.  

## 2023-12-07 NOTE — ED Notes (Signed)
 Brother requests call when sister is done with HD, he is contact in chart.

## 2023-12-07 NOTE — ED Triage Notes (Signed)
 Patient had a dialysis catheter placed right chest yesterday and woke up today with chest pain that is reproducible and feeling weak and lethargic.  Patient appears in no distress.  Patient last dialysis was on Tuesday.

## 2023-12-07 NOTE — Telephone Encounter (Signed)
 FYI Only or Action Required?: FYI only for provider.  Patient was last seen in primary care on 11/30/2023 by [name], NP.  Called Nurse Triage reporting Chest Pain.  Symptoms began today.  Interventions attempted: OTC medications: EMS instructed her to take and chew 4 81 mg tablets of Aspirin , EMS arrived and this RN gave report.  Symptoms are: rapidly worsening.  Triage Disposition: No disposition on file.  Patient/caregiver understands and will follow disposition?:       Copied from CRM [id number]. Topic: Clinical - Red Word Triage >> Dec 07, 2023  8:00 AM Carmell SAUNDERS wrote: Red Word that prompted transfer to Nurse Triage: Chest is tight and everything is going wrong says the patient and she wants to be seen. Reason for Disposition  [1] Chest pain lasts > 5 minutes AND [2] age > 26  Answer Assessment - Initial Assessment Questions 1. LOCATION: Where does it hurt?       States chest is tight 2. RADIATION: Does the pain go anywhere else? (e.g., into neck, jaw, arms, back)     denies 3. ONSET: When did the chest pain begin? (Minutes, hours or days)      today 4. PATTERN: Does the pain come and go, or has it been constant since it started?  Does it get worse with exertion?      Constant with difficulty breathing 5. DURATION: How long does it last (e.g., seconds, minutes, hours)     Na called 911 6. SEVERITY: How bad is the pain?  (e.g., Scale 1-10; mild, moderate, or severe)     To ER 7. CARDIAC RISK FACTORS: Do you have any history of heart problems or risk factors for heart disease? (e.g., angina, prior heart attack; diabetes, high blood pressure, high cholesterol, smoker, or strong family history of heart disease)     To ER 8. PULMONARY RISK FACTORS: Do you have any history of lung disease?  (e.g., blood clots in lung, asthma, emphysema, birth control pills)     To ER 9. CAUSE: What do you think is causing the chest pain?     To ER 10. OTHER  SYMPTOMS: Do you have any other symptoms? (e.g., dizziness, nausea, vomiting, sweating, fever, difficulty breathing, cough)       To ER 11. PREGNANCY: Is there any chance you are pregnant? When was your last menstrual period?       na  Protocols used: Chest Pain-A-AH

## 2023-12-07 NOTE — ED Notes (Signed)
 Help get patient into a gown on the monitor patient id resting with call bell in reach

## 2023-12-07 NOTE — ED Provider Notes (Signed)
 [facility] Provider Note   CSN: [id number] Arrival date & time: 12/07/23  9163     Patient presents with: Chest Pain   [name] is a 78 y.o. female past medical history significant for ESRD on dialysis Tuesday/Thursday/Saturday with recent replacement of IJ tunneled dialysis catheter yesterday, 8/14, anemia of chronic disease, type 2 diabetes, CAD on Plavix , HFpEF on Bumex who presents emergency department for shortness of breath.  Patient states that she was unable to get dialysis yesterday as she was having replacement of her tunneled catheter and began experiencing shortness of breath last night.  Patient states that her shortness of breath has worsened this a.m. and associated with chest pain surrounding the site of dialysis catheter replacement.  Patient denies associated nausea, vomiting, abdominal pain, syncope.  Patient states that she is scheduled for dialysis on Saturday.    Chest Pain      Prior to Admission medications   Medication Sig Start Date End Date Taking? Authorizing Provider  acetaminophen  (TYLENOL ) 500 MG tablet Take 1,000 mg by mouth every 8 (eight) hours as needed for headache.    [provider]  aspirin  81 MG chewable tablet Chew 1 tablet (81 mg total) by mouth daily. 05/12/17   Regalado, Belkys A, MD  calcium  acetate (PHOSLO) 667 MG capsule Take 1,334 mg by mouth 3 (three) times daily with meals.    [provider]  camphor-menthol VIKKI) lotion Apply 1 Application topically as needed for itching. 10/15/23   Medina-Vargas, Monina C, NP  Cinacalcet  HCl (SENSIPAR  PO) Take 1 capsule by mouth Every Tuesday,Thursday,and Saturday with dialysis.    [provider]  clopidogrel  (PLAVIX ) 75 MG tablet TAKE 1 TABLET BY MOUTH EVERY DAY 11/26/23   Eubanks, Jessica K, NP  ezetimibe  (ZETIA ) 10 MG tablet TAKE 1 TABLET BY MOUTH EVERY DAY 09/24/23   Fargo, Amy E, NP  folic acid -vitamin b complex-vitamin  c-selenium-zinc  (DIALYVITE) 3 MG TABS tablet Take 1 tablet by mouth daily.    [provider]  latanoprost  (XALATAN ) 0.005 % ophthalmic solution Place 1 drop into both eyes at bedtime.    [provider]  pantoprazole  (PROTONIX ) 40 MG tablet Take 1 tablet (40 mg total) by mouth daily. 11/30/23   Eubanks, Jessica K, NP  pravastatin  (PRAVACHOL ) 40 MG tablet TAKE 1 TABLET BY MOUTH EVERYDAY AT BEDTIME 09/24/23   Fargo, Amy E, NP  sertraline  (ZOLOFT ) 50 MG tablet Take 1 tablet (50 mg total) by mouth daily. 11/26/23   [name], NP  sevelamer (RENAGEL) 800 MG tablet Take 2,400 mg by mouth 3 (three) times daily with meals.    [provider]  traZODone  (DESYREL ) 100 MG tablet TAKE 1 TABLET BY MOUTH EVERYDAY AT BEDTIME 09/24/23   Fargo, Amy E, NP  triamcinolone  cream (KENALOG ) 0.1 % Apply topically 3 (three) times daily as needed. DX L30.9 MUST KEEP APPT WITH DERMATOLOGY 10/29/23   [name], NP    Allergies: Lisinopril , Pioglitazone, Morphine and codeine, and Sulfonamide derivatives    Review of Systems  Cardiovascular:  Positive for chest pain.    Updated Vital Signs BP 129/74   Pulse 88   Temp (!) 97.5 F (36.4 C)   Resp 17   Ht 5' 5 (1.651 m)   Wt 76.7 kg   SpO2 98%   BMI 28.14 kg/m   Physical Exam Vitals reviewed.  Constitutional:      General: She is not in acute distress.  Appearance: She is not ill-appearing.  Cardiovascular:     Rate and Rhythm: Normal rate.     Pulses:          Radial pulses are 2+ on the right side and 2+ on the left side.  Pulmonary:     Effort: Pulmonary effort is normal. No tachypnea.     Breath sounds: Examination of the right-lower field reveals rales. Examination of the left-lower field reveals rales. Rales present.  Chest:    Abdominal:     General: There is no distension.     Palpations: Abdomen is soft.     Tenderness: There is no abdominal tenderness.  Musculoskeletal:     Right lower leg: No edema.      Left lower leg: No edema.     Comments: Spontaneous movement of bilateral upper and lower extremities  Neurological:     Mental Status: She is alert.     (all labs ordered are listed, but only abnormal results are displayed) Labs Reviewed  CBC WITH DIFFERENTIAL/PLATELET - Abnormal; Notable for the following components:      Result Value   RBC 3.12 (*)    Hemoglobin 9.3 (*)    HCT 30.1 (*)    RDW 18.3 (*)    All other components within normal limits  COMPREHENSIVE METABOLIC PANEL WITH GFR - Abnormal; Notable for the following components:   Potassium 5.8 (*)    BUN 80 (*)    Creatinine, Ser 13.72 (*)    Calcium  8.8 (*)    Total Protein 6.2 (*)    Albumin  3.2 (*)    GFR, Estimated 3 (*)    All other components within normal limits  TROPONIN I (HIGH SENSITIVITY) - Abnormal; Notable for the following components:   Troponin I (High Sensitivity) 50 (*)    All other components within normal limits  TROPONIN I (HIGH SENSITIVITY) - Abnormal; Notable for the following components:   Troponin I (High Sensitivity) 44 (*)    All other components within normal limits  HEPATITIS B SURFACE ANTIGEN  HEPATITIS B SURFACE ANTIBODY, QUANTITATIVE  RENAL FUNCTION PANEL    EKG: EKG Interpretation Date/Time:  Friday December 07 2023 08:42:51 EDT Ventricular Rate:  87 PR Interval:  147 QRS Duration:  107 QT Interval:  396 QTC Calculation: 477 R Axis:   -36  Text Interpretation: Sinus rhythm Probable left atrial enlargement Left axis deviation Low voltage, extremity and precordial leads ST elevation, consider inferior injury Confirmed by Neysa Clap [phone]) on 12/07/2023 8:56:42 AM  Radiology: DG Chest Portable 1 View Result Date: 12/07/2023 EXAM: 1 VIEW XRAY OF THE CHEST 12/07/2023 09:00:00 AM COMPARISON: AP radiograph of the chest dated 01/08/2022. CLINICAL HISTORY: Chest pain; Per triage notes: Patient had a dialysis catheter placed right chest yesterday and woke up today with chest pain  that is reproducible and feeling weak and lethargic. Patient appears in no distress. Patient last dialysis was on Tuesday. FINDINGS: LUNGS AND PLEURA: Hazy and reticular opacities present within the lower lung zones bilaterally. No pleural effusion. No pneumothorax. HEART AND MEDIASTINUM: No acute abnormality of the cardiac and mediastinal silhouettes. BONES AND SOFT TISSUES: No acute osseous abnormality. A right internal jugular hemodialysis catheter has been placed and it appears in appropriate position. IMPRESSION: 1. Hazy and reticular opacities in the lower lung zones bilaterally. 2. Right internal jugular hemodialysis catheter in appropriate position. Electronically signed by: evalene coho 12/07/2023 09:15 AM EDT RP Workstation: [id number]   PERIPHERAL VASCULAR CATHETERIZATION Result Date: 12/06/2023  Images from the original result were not included. Patient name: MARKA TRELOAR MRN: [id number] DOB: 1946/04/12 Sex: female 12/06/2023 Pre-operative Diagnosis: ESRD, malfunction right IJ tunneled dialysis catheter Post-operative diagnosis:  Same Surgeon:  Penne BROCKS. Sheree, MD Procedure Performed: 1.  Exchange of right IJ tunneled dialysis catheter with 19 cm palindrome under fluoroscopic guidance 2.  Moderate sedation with fentanyl  and Versed  for 16 minutes Indications: 78 year old female has end-stage renal disease currently dialyzing via right IJ tunneled dialysis catheter which has been in place approximately 3 months.  She does have a new right upper arm AV graft which they can begin to cannulate at this time.  In the interim she will need a tunneled dialysis catheter which is not functioning properly and we are planning to exchange today. Findings: New catheter was placed to the SVC atrial junction and did flush and withdrawal blood easily and was locked with 1.6 cc of concentrated heparin  in both ports. Dialysis can begin to cannulate the right upper arm AV graft and after 1 week of successful  cannulation the [facility] can be removed.  Procedure:  The patient was identified in the holding area and taken to the heart and vascular procedure room.  She was sterilely prepped and draped in the right neck and chest to the existing catheter site and a timeout was called.  Moderate sedation with fentanyl  and Versed  was administered through the catheter.  We began by anesthetizing the tunneling site with 1% lidocaine .  The cuff was then dissected out from the catheter and when this was free the catheter was then wired under fluoroscopic guidance and the wire was placed into the IVC.  The catheter was then removed and pressure was held.  A new catheter was placed to the SVC atrial junction and the wire and stylette's were removed.  The catheter did flush and withdraw heparinized saline easily and was affixed to the skin with nylon suture.  It was locked with 1.6 cc of concentrated heparin  both ports and a sterile dressing was applied.  She tolerated the procedure well or may complication. Brandon C. Sheree, MD [facility] Office: [phone] Pager: [phone]     Procedures   Medications Ordered in the ED  Chlorhexidine  Gluconate Cloth 2 % PADS 6 each (0 each Topical Hold 12/07/23 1015)  feeding supplement (NEPRO CARB STEADY) liquid 237 mL (has no administration in time range)  heparin  injection 1,000 Units (1,000 Units Intracatheter Given 12/07/23 1525)  alteplase  (CATHFLO ACTIVASE ) injection 2 mg (has no administration in time range)    Clinical Course as of 12/07/23 1537  Fri Dec 07, 2023  0844 EKG reviewed by me: Normal sinus rhythm with normal axis and intervals.  No evidence of acute ischemia [AG]  0907 Chest x-ray reviewed by me: Trachea is midline.  Cardiomegaly present.  No pleural effusion.  Pulmonary edema present. No focal consolidation.  Mild pulmonary edema [AG]  0919 Evaluated; clinically well-appearing, slightly hypertensive.  Equal breath sounds.   Complaining of pain around catheter site as well as shortness of breath.  Broad workup underway. [TY]  [id number] Nephrology consult placed [AG]  1011 Will dialyze and reassess [AG]    Clinical Course User Index [AG] Nada Chroman, DO [TY] Neysa Caron PARAS, DO                                 Medical Decision Making Amount and/or Complexity of  Data Reviewed Labs: ordered. Radiology: ordered.   On initial evaluation patient is hypertensive however not hypoxic and afebrile.  Based upon patient's history and physical examination findings differential diagnosis include pneumothorax, pneumonia, acute pulmonary edema, infection.  Based on patient's history and physical examination findings will obtain laboratory studies as well as chest x-ray imaging.  Chest x-ray without evidence of pneumonia or pneumothorax however there is evidence of pulmonary edema.  As patient did not have dialysis session yesterday with noted hyperkalemia, elevated creatinine and shortness of breath secondary to pulmonary edema will consult nephrology.  Nephrology will dialyze the patient today.  At the time of signout patient was currently receiving dialysis.  Anticipate reevaluation patient's shortness of breath has improved patient may be able to be discharged with strict outpatient follow-up and return precautions in place.     Final diagnoses:  None    ED Discharge Orders     None      Lavanda Bolster DO Emergency Medicine PGY2     Bolster Lavanda, DO 12/07/23 1537    Neysa Caron PARAS, DO 12/07/23 1553

## 2023-12-07 NOTE — Procedures (Signed)
 I was present at this dialysis session. I have reviewed the session itself and made appropriate changes.   Vital signs in last 24 hours:  Temp:  [97.7 F (36.5 C)-97.8 F (36.6 C)] 97.7 F (36.5 C) (08/15 1139) Pulse Rate:  [78-90] 84 (08/15 1330) Resp:  [17-30] 19 (08/15 1330) BP: (113-169)/(64-102) 113/70 (08/15 1330) SpO2:  [93 %-100 %] 100 % (08/15 1330) Weight:  [76.7 kg] 76.7 kg (08/15 1139) Weight change:  Filed Weights   12/07/23 0840 12/07/23 1139  Weight: 76.7 kg 76.7 kg    Recent Labs  Lab 12/07/23 0903  NA 139  K 5.8*  CL 100  CO2 24  GLUCOSE 91  BUN 80*  CREATININE 13.72*  CALCIUM  8.8*    Recent Labs  Lab 12/07/23 0903  WBC 6.9  NEUTROABS 4.7  HGB 9.3*  HCT 30.1*  MCV 96.5  PLT 227    Scheduled Meds:  Chlorhexidine  Gluconate Cloth  6 each Topical Q0600   Continuous Infusions: PRN Meds:.   Fairy DELENA Sellar,  MD 12/07/2023, 1:51 PM

## 2023-12-08 LAB — HEPATITIS B SURFACE ANTIBODY, QUANTITATIVE: Hep B S AB Quant (Post): <3.5 m[IU]/mL — ABNORMAL LOW

## 2023-12-14 ENCOUNTER — Other Ambulatory Visit: Payer: Self-pay

## 2023-12-14 DIAGNOSIS — N186 End stage renal disease: Secondary | ICD-10-CM

## 2023-12-17 ENCOUNTER — Ambulatory Visit (HOSPITAL_COMMUNITY)
Admission: RE | Admit: 2023-12-17 | Discharge: 2023-12-17 | Disposition: A | Discharge status: Home / Self Care | Source: Ambulatory Visit | Attending: Surgery | Admitting: Surgery

## 2023-12-17 DIAGNOSIS — N186 End stage renal disease: Secondary | ICD-10-CM | POA: Diagnosis not present

## 2023-12-21 ENCOUNTER — Ambulatory Visit: Attending: Vascular Surgery | Admitting: Physician Assistant

## 2023-12-21 VITALS — BP 162/74 | HR 93 | Temp 98.2°F | Wt 167.1 lb

## 2023-12-21 DIAGNOSIS — S40021A Contusion of right upper arm, initial encounter: Secondary | ICD-10-CM

## 2023-12-26 NOTE — Progress Notes (Signed)
 POST OPERATIVE OFFICE NOTE    CC:  F/u for surgery  HPI: [name] is a 78 y.o. female who is here for evaluation of a hematoma over a right upper arm AV graft.  The patient recently underwent creation of a right brachial-axillary arteriovenous graft on 11/09/2023 by Dr. Lanis.  The patient states after surgery she developed a large hematoma around her graft at the [facility] fossa.  This has very slowly gotten smaller since surgery.  She is frustrated that this hematoma has not completely resolved by now.  This hematoma does not cause her any pain or discomfort.  She denies any fevers or chills.  Her right arm incision is well-healed.  She currently dialyzes through a right IJ TDC.   Allergies  Allergen Reactions   Lisinopril  Other (See Comments)    Abnormal Kidney Function    Pioglitazone Other (See Comments)    Desquamation of skin of the palm   Morphine And Codeine Nausea And Vomiting   Sulfonamide Derivatives Rash    Current Outpatient Medications  Medication Sig Dispense Refill   acetaminophen  (TYLENOL ) 500 MG tablet Take 1,000 mg by mouth every 8 (eight) hours as needed for headache.     aspirin  81 MG chewable tablet Chew 1 tablet (81 mg total) by mouth daily. 30 tablet 0   calcium  acetate (PHOSLO) 667 MG capsule Take 1,334 mg by mouth 3 (three) times daily with meals.     camphor-menthol (SARNA) lotion Apply 1 Application topically as needed for itching.     Cinacalcet  HCl (SENSIPAR  PO) Take 1 capsule by mouth Every Tuesday,Thursday,and Saturday with dialysis.     clopidogrel  (PLAVIX ) 75 MG tablet TAKE 1 TABLET BY MOUTH EVERY DAY 90 tablet 1   ezetimibe  (ZETIA ) 10 MG tablet TAKE 1 TABLET BY MOUTH EVERY DAY 90 tablet 3   folic acid -vitamin b complex-vitamin c-selenium-zinc  (DIALYVITE) 3 MG TABS tablet Take 1 tablet by mouth daily.     latanoprost  (XALATAN ) 0.005 % ophthalmic solution Place 1 drop into both eyes at bedtime.     pantoprazole  (PROTONIX ) 40 MG tablet Take 1 tablet  (40 mg total) by mouth daily. 90 tablet 0   pravastatin  (PRAVACHOL ) 40 MG tablet TAKE 1 TABLET BY MOUTH EVERYDAY AT BEDTIME 90 tablet 3   sertraline  (ZOLOFT ) 50 MG tablet Take 1 tablet (50 mg total) by mouth daily. 90 tablet 1   sevelamer (RENAGEL) 800 MG tablet Take 2,400 mg by mouth 3 (three) times daily with meals.     traZODone  (DESYREL ) 100 MG tablet TAKE 1 TABLET BY MOUTH EVERYDAY AT BEDTIME 90 tablet 1   triamcinolone  cream (KENALOG ) 0.1 % Apply topically 3 (three) times daily as needed. DX L30.9 MUST KEEP APPT WITH DERMATOLOGY 60 g 0   No current facility-administered medications for this visit.     ROS:  See HPI  Physical Exam:   Incision: Well-healed right AC fossa incision Extremities: Palpable right radial pulse.  Right upper arm AV graft with palpable thrill.  There is bruising around the distal upper arm and hematoma at the [facility] fossa Neuro: Intact motor and sensation of right upper extremity    Dialysis duplex (12/17/2023) Patent right upper extremity arteriovenous graft.  Large hematoma noted at the antecubital fossa, measuring 4.8 cm x 3.47 cm    Assessment/Plan:  This is a 78 y.o. female who is here for graft evaluation  - The patient recently underwent placement of a right upper arm brachial artery to axillary vein arteriovenous graft  on 11/09/2023 for permanent dialysis access - Shortly after surgery, the patient developed a large hematoma over her graft around the [facility] fossa.  She says that the hematoma was much larger than it is now.  She is frustrated that the hematoma has not completely resolved now, preventing her from using the graft for dialysis.  She denies any pain or discomfort in the arm.  She denies any fevers or chills - Duplex demonstrates a patent right upper extremity graft.  There is a hematoma around the [facility] fossa measuring 4.8 cm x 3.47 cm - On exam her right upper extremity graft has a great thrill in it.  She has no tenderness to palpation of the arm.   She has a palpable right radial pulse.  She has intact motor and sensation of the right hand - I have encouraged the patient that this hematoma should continue to resolve with time.  It may take several more weeks for this to fully go away before the graft can be used.  I have wrapped her upper arm with a light Ace wrap and encouraged her to wear this as needed to help compress the hematoma.  I do not think she warrants right arm exploration given that there are no signs of active bleeding and her hematoma is getting smaller.  I have explained to the patient that pursuing surgery at this time would put her at increased risk for infection of the graft. - She should continue to use her tunneled catheter for dialysis at this time.  We will bring the patient back for repeat evaluation in a few weeks to see if her hematoma has improved.   Ahmed Holster, PA-C [facility] [phone]   Clinic MD:  [place]

## 2024-01-04 ENCOUNTER — Ambulatory Visit: Admitting: Nurse Practitioner

## 2024-01-04 ENCOUNTER — Encounter: Payer: Self-pay | Admitting: Nurse Practitioner

## 2024-01-04 VITALS — BP 140/80 | HR 90 | Temp 97.7°F | Resp 16 | Ht 65.0 in | Wt 160.8 lb

## 2024-01-04 DIAGNOSIS — E785 Hyperlipidemia, unspecified: Secondary | ICD-10-CM | POA: Diagnosis not present

## 2024-01-04 DIAGNOSIS — N186 End stage renal disease: Secondary | ICD-10-CM

## 2024-01-04 DIAGNOSIS — F339 Major depressive disorder, recurrent, unspecified: Secondary | ICD-10-CM | POA: Diagnosis not present

## 2024-01-04 DIAGNOSIS — Z23 Encounter for immunization: Secondary | ICD-10-CM | POA: Diagnosis not present

## 2024-01-04 DIAGNOSIS — Z8673 Personal history of transient ischemic attack (TIA), and cerebral infarction without residual deficits: Secondary | ICD-10-CM

## 2024-01-04 DIAGNOSIS — K219 Gastro-esophageal reflux disease without esophagitis: Secondary | ICD-10-CM | POA: Diagnosis not present

## 2024-01-04 DIAGNOSIS — E1142 Type 2 diabetes mellitus with diabetic polyneuropathy: Secondary | ICD-10-CM | POA: Diagnosis not present

## 2024-01-04 DIAGNOSIS — I10 Essential (primary) hypertension: Secondary | ICD-10-CM

## 2024-01-04 DIAGNOSIS — D631 Anemia in chronic kidney disease: Secondary | ICD-10-CM

## 2024-01-04 DIAGNOSIS — Z992 Dependence on renal dialysis: Secondary | ICD-10-CM

## 2024-01-04 DIAGNOSIS — L309 Dermatitis, unspecified: Secondary | ICD-10-CM

## 2024-01-04 DIAGNOSIS — F17200 Nicotine dependence, unspecified, uncomplicated: Secondary | ICD-10-CM

## 2024-01-04 MED ORDER — COVID-19 MRNA VAC-TRIS(PFIZER) 30 MCG/0.3ML IM SUSY: 0.3000 mL | PREFILLED_SYRINGE | Freq: Once | INTRAMUSCULAR | 0 refills | Status: AC

## 2024-01-04 NOTE — Assessment & Plan Note (Signed)
 A1c is at goal, no on any medication, continue dietary modifications

## 2024-01-04 NOTE — Progress Notes (Signed)
 Careteam: Patient Care Team: [name], NP as PCP - General (Nurse Practitioner) Verlin Lonni BIRCH, MD as PCP - Cardiology (Cardiology) Octavia Charleston, MD as Consulting Physician (Ophthalmology) Rayburn Pac, MD as Consulting Physician (Nephrology) Delores Ricka Maidens, NP as Nurse Practitioner (Nurse Practitioner) Center, [facility]  PLACE OF SERVICE:  [facility] CLINIC  Advanced Directive information Does Patient Have a Medical Advance Directive?: Yes, Type of Advance Directive: Out of facility DNR (pink MOST or yellow form), Does patient want to make changes to medical advance directive?: No - Patient declined  Allergies  Allergen Reactions   Lisinopril  Other (See Comments)    Abnormal Kidney Function    Pioglitazone Other (See Comments)    Desquamation of skin of the palm   Morphine And Codeine Nausea And Vomiting   Sulfonamide Derivatives Rash    Chief Complaint  Patient presents with   Medical Management of Chronic Issues    Routine visit. Discuss the need for Eye exam, Lung cancer screening, Influenza vaccine, and Covid Booster.    HPI:  Discussed the use of AI scribe software for clinical note transcription with the patient, who gave verbal consent to proceed.  History of Present Illness [name] is a 78 year old female with end-stage renal disease on dialysis who presents for a routine follow-up.  She continues dialysis on Tuesdays, Thursdays, and Saturdays. She reports issues with her dialysis access, noting that her fistula is not functioning properly, and she has experienced multiple failed ports. No recent chest pain or shortness of breath.  She has a history of diabetes, but her recent A1c levels indicate she is now in the non-diabetic range at 4.7. She was previously on medication for diabetes but is no longer taking it.  Her cholesterol levels are well-controlled with Zetia  and Pravachol , with an LDL of 58.  She is on  trazodone  for insomnia and reports sleeping well. She also takes Zoloft  50 mg for depression.  She has a history of stroke and is on Plavix  and aspirin . She continues to follow up with a neurologist.  She has a history of smoking and is due for lung cancer screening. She got a letter in the mail to schedule her lung cancer screening. No desire to quit smoking.   She visited a dermatologist for a rash, which has improved with a Vaseline-like medication. She reports less itching.  She received her flu shot today and has a prescription for a COVID booster sent to the pharmacy.   Review of Systems:  Review of Systems  Constitutional:  Negative for chills, fever and weight loss.  HENT:  Negative for tinnitus.   Respiratory:  Negative for cough, sputum production and shortness of breath.   Cardiovascular:  Negative for chest pain, palpitations and leg swelling.  Gastrointestinal:  Negative for abdominal pain, constipation, diarrhea and heartburn.  Genitourinary:  Negative for dysuria, frequency and urgency.  Musculoskeletal:  Negative for back pain, falls, joint pain and myalgias.  Skin: Negative.   Neurological:  Negative for dizziness and headaches.  Psychiatric/Behavioral:  Negative for depression and memory loss. The patient does not have insomnia.     Past Medical History:  Diagnosis Date   Anemia    Anxiety    Arthritis    lower back and knees    Asthma    childhood   Barrett's esophagus    CAD (coronary artery disease)    Chronic kidney disease    Coagulation defect (HCC)  Depression    Diabetes mellitus    Dysphagia    Eczema    End stage renal disease (HCC)    Fluid overload, unspecified    History of herpes zoster 02/2010   Recovered fully after period of acute herpetic neuralgia tx w/ gabapentin .    Hx MRSA infection 2009   Hypercalcemia    Hyperlipidemia    Hypertension    Mitral regurgitation    moderate-severe MR 08/11/22 echo   Moderate protein-calorie  malnutrition (HCC)    Neuromuscular disorder (HCC)    chronic pain   Personal history of colonic polyps 10/17/2010   hyperplastic   Stroke [facility])    Past Surgical History:  Procedure Laterality Date   A/V FISTULAGRAM N/A 05/11/2023   Procedure: A/V Fistulagram;  Surgeon: [name], MD;  Location: [facility] INVASIVE CV LAB;  Service: Cardiovascular;  Laterality: N/A;   ABDOMINAL HYSTERECTOMY     unclear when   AV FISTULA PLACEMENT Left 02/13/2017   Procedure: ARTERIOVENOUS (AV) GORE-TEX STRETCH GRAFT INSERTION INTO LEFT ARM;  Surgeon: Harvey Carlin BRAVO, MD;  Location: [facility] OR;  Service: Vascular;  Laterality: Left;   AV FISTULA PLACEMENT Right 01/16/2022   Procedure: RIGHT ARM ARTERIOVENOUS (AV) FISTULA;  Surgeon: [name], MD;  Location: [facility] OR;  Service: Vascular;  Laterality: Right;   AV FISTULA PLACEMENT Right 09/01/2022   Procedure: INSERTION OF ARTERIOVENOUS (AV) GORE-TEX GRAFT RIGHT ARM;  Surgeon: Sheree Penne Lonni, MD;  Location: [facility] OR;  Service: Vascular;  Laterality: Right;   CATARACT EXTRACTION Bilateral    COLONOSCOPY     INSERTION OF ARTERIOVENOUS (AV) ARTEGRAFT ARM Right 11/09/2023   Procedure: INSERTION BRACHIAL ARTERY TO AXILLARY VEIN ARTERIOVENOUS GRAFT, UPPER EXTREMITY;  Surgeon: Lanis Fonda BRAVO, MD;  Location: [facility] OR;  Service: Vascular;  Laterality: Right;   IR FLUORO GUIDE CV LINE RIGHT  01/09/2022   IR FLUORO GUIDE CV LINE RIGHT  09/03/2023   IR US  GUIDE VASC ACCESS RIGHT  01/09/2022   IR US  GUIDE VASC ACCESS RIGHT  09/03/2023   OTHER SURGICAL HISTORY     Catherer placed by [facility], right upper chest area.   PERIPHERAL VASCULAR BALLOON ANGIOPLASTY  05/11/2023   Procedure: PERIPHERAL VASCULAR BALLOON ANGIOPLASTY;  Surgeon: [name], MD;  Location: [facility] INVASIVE CV LAB;  Service: Cardiovascular;;  Venous Anastomosis   PERIPHERAL VASCULAR BALLOON ANGIOPLASTY  05/30/2023   Procedure: PERIPHERAL VASCULAR BALLOON ANGIOPLASTY;  Surgeon: [name], MD;   Location: [facility] INVASIVE CV LAB;  Service: Cardiovascular;;  Venous Anastomosis; Distal Intragraft   PERIPHERAL VASCULAR THROMBECTOMY N/A 05/30/2023   Procedure: PERIPHERAL VASCULAR THROMBECTOMY;  Surgeon: [name], MD;  Location: [facility] INVASIVE CV LAB;  Service: Cardiovascular;  Laterality: N/A;   thigh surg     left side due to MRSA   TUNNELLED CATHETER EXCHANGE N/A 12/06/2023   Procedure: TUNNELLED CATHETER EXCHANGE;  Surgeon: Sheree Penne Lonni, MD;  Location: [facility] LAB;  Service: Cardiovascular;  Laterality: N/A;   UPPER EXTREMITY VENOGRAPHY Left 10/29/2023   Procedure: UPPER EXTREMITY VENOGRAPHY;  Surgeon: Sheree Penne Lonni, MD;  Location: [facility] INVASIVE CV LAB;  Service: Cardiovascular;  Laterality: Left;   UPPER GASTROINTESTINAL ENDOSCOPY     Social History:   reports that she has been smoking cigarettes. She has a 20 pack-year smoking history. She has never been exposed to tobacco smoke. She has never used smokeless tobacco. She reports current alcohol use of about 2.0 standard drinks of alcohol per week. She reports  that she does not use drugs.  Family History  Problem Relation Age of Onset   Hypertension Father    Kidney disease Father    Diabetes Brother    Colon cancer Neg Hx    CAD Neg Hx    Rectal cancer Neg Hx    Stomach cancer Neg Hx     Medications: Patient's Medications  New Prescriptions   COVID-19 MRNA VACCINE, PFIZER, (COMIRNATY) SYRINGE    Inject 0.3 mLs into the muscle once for 1 dose. Covid/Moderna vaccine substitute for house stock  Previous Medications   ACETAMINOPHEN  (TYLENOL ) 500 MG TABLET    Take 1,000 mg by mouth every 8 (eight) hours as needed for headache.   ASPIRIN  81 MG CHEWABLE TABLET    Chew 1 tablet (81 mg total) by mouth daily.   CALCIUM  ACETATE (PHOSLO) 667 MG CAPSULE    Take 1,334 mg by mouth 3 (three) times daily with meals.   CAMPHOR-MENTHOL (SARNA) LOTION    Apply 1 Application topically as needed for itching.   CINACALCET  HCL  (SENSIPAR  PO)    Take 1 capsule by mouth Every Tuesday,Thursday,and Saturday with dialysis.   CLOPIDOGREL  (PLAVIX ) 75 MG TABLET    TAKE 1 TABLET BY MOUTH EVERY DAY   EZETIMIBE  (ZETIA ) 10 MG TABLET    TAKE 1 TABLET BY MOUTH EVERY DAY   FOLIC ACID -VITAMIN B COMPLEX-VITAMIN C-SELENIUM-ZINC  (DIALYVITE) 3 MG TABS TABLET    Take 1 tablet by mouth daily.   LATANOPROST  (XALATAN ) 0.005 % OPHTHALMIC SOLUTION    Place 1 drop into both eyes at bedtime.   PANTOPRAZOLE  (PROTONIX ) 40 MG TABLET    Take 1 tablet (40 mg total) by mouth daily.   PRAVASTATIN  (PRAVACHOL ) 40 MG TABLET    TAKE 1 TABLET BY MOUTH EVERYDAY AT BEDTIME   SERTRALINE  (ZOLOFT ) 50 MG TABLET    Take 1 tablet (50 mg total) by mouth daily.   SEVELAMER (RENAGEL) 800 MG TABLET    Take 2,400 mg by mouth 3 (three) times daily with meals.   TRAZODONE  (DESYREL ) 100 MG TABLET    TAKE 1 TABLET BY MOUTH EVERYDAY AT BEDTIME   TRIAMCINOLONE  CREAM (KENALOG ) 0.1 %    Apply topically 3 (three) times daily as needed. DX L30.9 MUST KEEP APPT WITH DERMATOLOGY  Modified Medications   No medications on file  Discontinued Medications   No medications on file    Physical Exam:  Vitals:   01/04/24 1019 01/04/24 1020  BP: (!) 140/82 (!) 140/80  Pulse: 90   Resp: 16   Temp: 97.7 F (36.5 C)   SpO2: 96%   Weight: 160 lb 12.8 oz (72.9 kg)   Height: 5' 5 (1.651 m)    Body mass index is 26.76 kg/m. Wt Readings from Last 3 Encounters:  01/04/24 160 lb 12.8 oz (72.9 kg)  12/21/23 167 lb 1.6 oz (75.8 kg)  12/07/23 169 lb 1.5 oz (76.7 kg)    Physical Exam Constitutional:      General: She is not in acute distress.    Appearance: She is well-developed. She is not diaphoretic.  HENT:     Head: Normocephalic and atraumatic.     Mouth/Throat:     Pharynx: No oropharyngeal exudate.  Eyes:     Conjunctiva/sclera: Conjunctivae normal.     Pupils: Pupils are equal, round, and reactive to light.  Cardiovascular:     Rate and Rhythm: Normal rate and regular  rhythm.     Heart sounds: Normal heart sounds.  Pulmonary:     Effort: Pulmonary effort is normal.     Breath sounds: Normal breath sounds.  Abdominal:     General: Bowel sounds are normal.     Palpations: Abdomen is soft.  Musculoskeletal:     Cervical back: Normal range of motion and neck supple.     Right lower leg: No edema.     Left lower leg: No edema.  Skin:    General: Skin is warm and dry.  Neurological:     Mental Status: She is alert.  Psychiatric:        Mood and Affect: Mood normal.     Labs reviewed: Basic Metabolic Panel: Recent Labs    09/01/23 0821 11/09/23 0909 12/07/23 0903 12/07/23 1519  NA 141 137 139 134*  K 4.4 5.1 5.8* 3.0*  CL 96* 110 100 95*  CO2 29  --  24 27  GLUCOSE 101* 84 91 77  BUN 38* 30* 80* 25*  CREATININE 10.19* 8.70* 13.72* 4.92*  CALCIUM  8.8*  --  8.8* 8.6*  PHOS  --   --   --  1.6*   Liver Function Tests: Recent Labs    09/01/23 0821 12/07/23 0903 12/07/23 1519  AST 24 18  --   ALT 23 13  --   ALKPHOS 93 87  --   BILITOT 0.7 0.5  --   PROT 6.8 6.2*  --   ALBUMIN  3.8 3.2* 3.3*   No results for input(s): LIPASE, AMYLASE in the last 8760 hours. No results for input(s): AMMONIA in the last 8760 hours. CBC: Recent Labs    09/01/23 0821 11/09/23 0909 12/07/23 0903  WBC 7.8  --  6.9  NEUTROABS 5.2  --  4.7  HGB 11.5* 11.9* 9.3*  HCT 35.1* 35.0* 30.1*  MCV 100.3*  --  96.5  PLT 198  --  227   Lipid Panel: Recent Labs    11/30/23 0926  CHOL 140  HDL 61  LDLCALC 58  TRIG 119  CHOLHDL 2.3   TSH: No results for input(s): TSH in the last 8760 hours. A1C: Lab Results  Component Value Date   HGBA1C 4.7 11/30/2023     Assessment/Plan Controlled type 2 diabetes mellitus with diabetic polyneuropathy, without long-term current use of insulin  (HCC) Assessment & Plan: A1c is at goal, no on any medication, continue dietary modifications     Hyperlipidemia with target LDL less than 70 Assessment &  Plan: LDL at goal on pravstatin and zetia .    Gastroesophageal reflux disease, unspecified whether esophagitis present Assessment & Plan: Controlled on protonix .    Recurrent major depressive disorder, remission status unspecified (HCC) Assessment & Plan: Stable at this time on zoloft  and trazodone . Continue current regimen.    ESRD (end stage renal disease) on dialysis [facility]) Assessment & Plan: Continues to follow up with nephrology and continues on dialysis   Essential hypertension Assessment & Plan: Not currently on medication, blood pressure mildly elevated today but better on HD days. Continue dietary modifications and monitor.    History of CVA (cerebrovascular accident) Assessment & Plan: Stable, continues on plavix  and asa     Smoker Assessment & Plan: Enouraged to call and schedule CT screening of lung Smoking cessation encouraged.    Dermatitis Assessment & Plan: Saw dermatologist who gave her rx topical treatment- unsure of what but reports benefit.    Need for COVID-19 vaccine -     COVID-19 mRNA Vac-TriS(Pfizer); Inject 0.3 mLs into the muscle once  for 1 dose. Covid/Moderna vaccine substitute for house stock  Dispense: 0.3 mL; Refill: 0  Need for influenza vaccination -     Flu vaccine HIGH DOSE PF(Fluzone Trivalent)  Anemia due to stage 5 chronic kidney disease, not on chronic dialysis [facility]) Assessment & Plan: Ongoing, being monitored by nephrology      Return in about 6 months (around 07/03/2024) for routine follow up.:  Mikaella Escalona K. Caro BODILY [facility] [phone]

## 2024-01-04 NOTE — Assessment & Plan Note (Signed)
 Enouraged to call and schedule CT screening of lung Smoking cessation encouraged.

## 2024-01-04 NOTE — Assessment & Plan Note (Signed)
 Continues to follow up with nephrology and continues on dialysis

## 2024-01-04 NOTE — Assessment & Plan Note (Signed)
 Stable, continues on plavix  and asa

## 2024-01-04 NOTE — Assessment & Plan Note (Signed)
 Not currently on medication, blood pressure mildly elevated today but better on HD days. Continue dietary modifications and monitor.

## 2024-01-04 NOTE — Assessment & Plan Note (Signed)
Controlled on protonix.   

## 2024-01-04 NOTE — Assessment & Plan Note (Signed)
 Stable at this time on zoloft  and trazodone . Continue current regimen.

## 2024-01-04 NOTE — Assessment & Plan Note (Signed)
 Saw dermatologist who gave her rx topical treatment- unsure of what but reports benefit.

## 2024-01-04 NOTE — Assessment & Plan Note (Signed)
 LDL at goal on pravstatin and zetia .

## 2024-01-04 NOTE — Assessment & Plan Note (Signed)
 Ongoing, being monitored by nephrology

## 2024-01-07 ENCOUNTER — Telehealth: Payer: Self-pay | Admitting: Acute Care

## 2024-01-07 DIAGNOSIS — F17210 Nicotine dependence, cigarettes, uncomplicated: Secondary | ICD-10-CM

## 2024-01-07 DIAGNOSIS — Z122 Encounter for screening for malignant neoplasm of respiratory organs: Secondary | ICD-10-CM

## 2024-01-07 DIAGNOSIS — Z87891 Personal history of nicotine dependence: Secondary | ICD-10-CM

## 2024-01-07 NOTE — Telephone Encounter (Signed)
 Lung Cancer Screening Narrative/Criteria Questionnaire (Cigarette Smokers Only- No Cigars/Pipes/vapes)   [name]   SDMV:01/21/2024 9:00 [name]  [birth year]   LDCT: 01/28/2024 9:00 GI    78 y.o.   Phone: [phone]  Lung Screening Narrative (confirm age 15-77 yrs Medicare / 50-80 yrs Private pay insurance)   Insurance information:[employer] mcr   Referring Provider:Jessica Caro NP   This screening involves an initial phone call with a team member from our program. It is called a shared decision making visit. The initial meeting is required by  insurance and Medicare to make sure you understand the program. This appointment takes about 15-20 minutes to complete. You will complete the screening scan at your scheduled date/time.  This scan takes about 5-10 minutes to complete. You can eat and drink normally before and after the scan.  Criteria questions for Lung Cancer Screening:   Are you a current or former smoker? Current Age began smoking: 78yo   If you are a former smoker, what year did you quit smoking? N/A(within 15 yrs)   To calculate your smoking history, I need an accurate estimate of how many packs of cigarettes you smoked per day and for how many years. (Not just the number of PPD you are now smoking)   Years smoking 61 x Packs per day 1 = Pack years 61   (at least 20 pack yrs)   (Make sure they understand that we need to know how much they have smoked in the past, not just the number of PPD they are smoking now)  Do you have a personal history of cancer?  No    Do you have a family history of cancer? No  Are you coughing up blood?  No  Have you had unexplained weight loss of 15 lbs or more in the last 6 months? No  It looks like you meet all criteria.  When would be a good time for us  to schedule you for this screening?   Additional information: N/A

## 2024-01-09 ENCOUNTER — Other Ambulatory Visit: Payer: Self-pay | Admitting: Nurse Practitioner

## 2024-01-09 DIAGNOSIS — L309 Dermatitis, unspecified: Secondary | ICD-10-CM

## 2024-01-18 ENCOUNTER — Ambulatory Visit: Attending: Vascular Surgery | Admitting: Physician Assistant

## 2024-01-18 VITALS — BP 162/85 | HR 84 | Temp 97.6°F | Resp 18 | Ht 65.0 in | Wt 160.0 lb

## 2024-01-18 DIAGNOSIS — N186 End stage renal disease: Secondary | ICD-10-CM

## 2024-01-18 DIAGNOSIS — S40021A Contusion of right upper arm, initial encounter: Secondary | ICD-10-CM

## 2024-01-18 NOTE — Progress Notes (Signed)
    Postoperative Access Visit   History of Present Illness   [name] is a 78 y.o. year old female who presents for postoperative follow-up for: right upper arm arteriovenous graft (Date: 11/09/2023).  She has since been started on HD and has had a tunneled dialysis catheter placed by Dr. Sheree on 12/06/2023.  She developed a hematoma at the arterial anastomosis postoperatively.  She returns today for evaluation.  She believes the hematoma has decreased in size however there is still a knot in this area.  She continues to dialyze via right IJ TDC at the [facility].  She receives dialysis on a Tuesday Thursday Saturday schedule.  She denies steal symptoms in her right hand.   Physical Examination   Vitals:   01/18/24 1038  BP: (!) 162/85  Pulse: 84  Resp: 18  Temp: 97.6 F (36.4 C)  TempSrc: Temporal  SpO2: 98%  Weight: 160 lb (72.6 kg)  Height: 5' 5 (1.651 m)   Body mass index is 26.63 kg/m.  R arm Incisions are healed, palpable R radial pulse, hand grip is 5/5, sensation in digits is intact, palpable thrill, bruit can be auscultated     Medical Decision Making   PRIYA MATSEN is a 78 y.o. year old female who presents s/p right upper arm arteriovenous graft  Patent AVG without signs or symptoms of steal syndrome Hematoma at the arterial anastomosis without skin compromise The patient's access will be ready for use 01/22/24 The patient's tunneled dialysis catheter can be removed when Nephrology is comfortable with the performance of the AVG The patient may follow up on a prn basis   Donnice Sender PA-C [facility] Office: [phone]  Clinic MD: Gretta on call

## 2024-01-21 ENCOUNTER — Ambulatory Visit: Admitting: Adult Health

## 2024-01-21 ENCOUNTER — Encounter: Payer: Self-pay | Admitting: Adult Health

## 2024-01-21 DIAGNOSIS — F17210 Nicotine dependence, cigarettes, uncomplicated: Secondary | ICD-10-CM

## 2024-01-21 NOTE — Progress Notes (Signed)
  Virtual Visit via Telephone Note  I connected with [name] , 01/21/24 9:06 AM by a telemedicine application and verified that I am speaking with the correct person using two identifiers.  Location: Patient: home Provider: home   I discussed the limitations of evaluation and management by telemedicine and the availability of in person appointments. The patient expressed understanding and agreed to proceed.   Shared Decision Making Visit Lung Cancer Screening Program [phone])   Eligibility: 78 y.o. Pack Years Smoking History Calculation = 61 pack years  (# packs/per year x # years smoked) Recent History of coughing up blood  no Unexplained weight loss? no ( >Than 15 pounds within the last 6 months ) Prior History Lung / other cancer no (Diagnosis within the last 5 years already requiring surveillance chest CT Scans). Smoking Status Current Smoker  Visit Components: Discussion included one or more decision making aids. YES Discussion included risk/benefits of screening. YES Discussion included potential follow up diagnostic testing for abnormal scans. YES Discussion included meaning and risk of over diagnosis. YES Discussion included meaning and risk of False Positives. YES Discussion included meaning of total radiation exposure. YES  Counseling Included: Importance of adherence to annual lung cancer LDCT screening. YES Impact of comorbidities on ability to participate in the program. YES Ability and willingness to under diagnostic treatment. YES  Smoking Cessation Counseling: Current Smokers:  Discussed importance of smoking cessation. yes Information about tobacco cessation classes and interventions provided to patient. yes Patient provided with ticket for LDCT Scan. yes Symptomatic Patient. NO Diagnosis Code: Tobacco Use Z72.0 Asymptomatic Patient yes  Counseling - 4 minutes of smoking cessation counseling  (CT Chest Lung Cancer Screening Low Dose W/O CM)  [id number]  Z12.2-Screening of respiratory organs Z87.891-Personal history of nicotine  dependence   [name] 01/21/24

## 2024-01-21 NOTE — Patient Instructions (Signed)

## 2024-01-23 ENCOUNTER — Other Ambulatory Visit (HOSPITAL_COMMUNITY): Payer: Self-pay | Admitting: Nephrology

## 2024-01-23 DIAGNOSIS — N186 End stage renal disease: Secondary | ICD-10-CM

## 2024-01-28 ENCOUNTER — Inpatient Hospital Stay: Admission: RE | Admit: 2024-01-28 | Source: Ambulatory Visit

## 2024-02-04 ENCOUNTER — Ambulatory Visit (HOSPITAL_COMMUNITY): Admission: RE | Admit: 2024-02-04 | Source: Ambulatory Visit

## 2024-02-04 ENCOUNTER — Encounter (HOSPITAL_COMMUNITY): Payer: Self-pay

## 2024-02-15 ENCOUNTER — Ambulatory Visit (HOSPITAL_COMMUNITY)
Admission: RE | Admit: 2024-02-15 | Discharge: 2024-02-15 | Disposition: A | Discharge status: Home / Self Care | Source: Ambulatory Visit | Attending: Nephrology | Admitting: Nephrology

## 2024-02-15 DIAGNOSIS — Z4901 Encounter for fitting and adjustment of extracorporeal dialysis catheter: Secondary | ICD-10-CM | POA: Diagnosis present

## 2024-02-15 DIAGNOSIS — N186 End stage renal disease: Secondary | ICD-10-CM | POA: Insufficient documentation

## 2024-02-15 HISTORY — PX: IR REMOVAL TUN CV CATH W/O FL: IMG2289

## 2024-02-15 MED ORDER — LIDOCAINE-EPINEPHRINE 1 %-1:100000 IJ SOLN
INTRAMUSCULAR | Status: AC
  Filled 2024-02-15: qty 1

## 2024-02-21 ENCOUNTER — Other Ambulatory Visit: Payer: Self-pay | Admitting: Nurse Practitioner

## 2024-02-21 NOTE — Telephone Encounter (Signed)
 Copied from CRM #[id number]. Topic: Clinical - Medication Refill >> Feb 21, 2024 11:33 AM Debby BROCKS wrote: Medication: pantoprazole  (PROTONIX ) 40 MG tablet  Has the patient contacted their pharmacy? Yes (Agent: If no, request that the patient contact the pharmacy for the refill. If patient does not wish to contact the pharmacy document the reason why and proceed with request.) (Agent: If yes, when and what did the pharmacy advise?)  This is the patient's preferred pharmacy:  [id number] - [place], [place] - [address] AT [facility] [address] [place] [address] Phone: [phone] Fax: [phone]  Is this the correct pharmacy for this prescription? Yes If no, delete pharmacy and type the correct one.   Has the prescription been filled recently? No  Is the patient out of the medication? Yes  Has the patient been seen for an appointment in the last year OR does the patient have an upcoming appointment? Yes  Can we respond through MyChart? No  Agent: Please be advised that Rx refills may take up to 3 business days. We ask that you follow-up with your pharmacy.

## 2024-02-21 NOTE — Telephone Encounter (Signed)
 Spoke with patient and advised that I did confirm with pharmacy that she has a refill available and they will get it ready for her to pick up. Closing encounter

## 2024-04-21 ENCOUNTER — Other Ambulatory Visit: Payer: Self-pay

## 2024-04-21 ENCOUNTER — Encounter (HOSPITAL_COMMUNITY): Payer: Self-pay | Admitting: Vascular Surgery

## 2024-04-21 ENCOUNTER — Encounter (HOSPITAL_COMMUNITY)
Admission: RE | Disposition: A | Discharge status: Home / Self Care | Payer: Self-pay | Source: Home / Self Care | Attending: Vascular Surgery

## 2024-04-21 ENCOUNTER — Ambulatory Visit (HOSPITAL_COMMUNITY)
Admission: RE | Admit: 2024-04-21 | Discharge: 2024-04-21 | Disposition: A | Discharge status: Home / Self Care | Attending: Vascular Surgery | Admitting: Vascular Surgery

## 2024-04-21 DIAGNOSIS — N186 End stage renal disease: Secondary | ICD-10-CM | POA: Diagnosis not present

## 2024-04-21 DIAGNOSIS — Z992 Dependence on renal dialysis: Secondary | ICD-10-CM | POA: Diagnosis not present

## 2024-04-21 DIAGNOSIS — I120 Hypertensive chronic kidney disease with stage 5 chronic kidney disease or end stage renal disease: Secondary | ICD-10-CM | POA: Diagnosis not present

## 2024-04-21 DIAGNOSIS — E1122 Type 2 diabetes mellitus with diabetic chronic kidney disease: Secondary | ICD-10-CM | POA: Diagnosis not present

## 2024-04-21 DIAGNOSIS — T82858A Stenosis of vascular prosthetic devices, implants and grafts, initial encounter: Secondary | ICD-10-CM | POA: Diagnosis not present

## 2024-04-21 DIAGNOSIS — Y832 Surgical operation with anastomosis, bypass or graft as the cause of abnormal reaction of the patient, or of later complication, without mention of misadventure at the time of the procedure: Secondary | ICD-10-CM | POA: Diagnosis not present

## 2024-04-21 DIAGNOSIS — F17210 Nicotine dependence, cigarettes, uncomplicated: Secondary | ICD-10-CM | POA: Diagnosis not present

## 2024-04-21 HISTORY — PX: A/V FISTULAGRAM: CATH118298

## 2024-04-21 SURGERY — A/V FISTULAGRAM
Anesthesia: LOCAL | Site: Arm Upper | Laterality: Left

## 2024-04-21 MED ORDER — LIDOCAINE HCL (PF) 1 % IJ SOLN
INTRAMUSCULAR | Status: AC
  Filled 2024-04-21: qty 30

## 2024-04-21 MED ORDER — LIDOCAINE HCL (PF) 1 % IJ SOLN
INTRAMUSCULAR | Status: DC | PRN
  Administered 2024-04-21: 5 mL

## 2024-04-21 MED ORDER — HEPARIN (PORCINE) IN NACL 1000-0.9 UT/500ML-% IV SOLN
INTRAVENOUS | Status: DC | PRN
  Administered 2024-04-21: 500 mL

## 2024-04-21 MED ORDER — IODIXANOL 320 MG/ML IV SOLN
INTRAVENOUS | Status: DC | PRN
  Administered 2024-04-21: 20 mL via INTRAVENOUS

## 2024-04-21 SURGICAL SUPPLY — 5 items
KIT MICROPUNCTURE NIT STIFF (SHEATH) IMPLANT
SHEATH PROBE COVER 6X72 (BAG) IMPLANT
TRAY PV CATH (CUSTOM PROCEDURE TRAY) ×1 IMPLANT
TUBING CIL FLEX 10 FLL-RA (TUBING) IMPLANT
WIRE TORQFLEX AUST .018X40CM (WIRE) IMPLANT

## 2024-04-21 NOTE — Op Note (Signed)
" ° ° °  Patient name: [name] MRN: [id number] DOB: 1946/01/29 Sex: female  04/21/2024 Pre-operative Diagnosis: End-stage renal disease, malfunction right arm AV graft with low flow on dialysis Post-operative diagnosis:  Same Surgeon:  Penne BROCKS. Sheree, MD Procedure Performed: 1.  Percutaneous ultrasound-guided cannulation right arm AV graft 2.  Right upper extremity shuntogram   Indications: 78 year old female with history of end-stage renal disease currently dialyzing via right arm AV graft.  She has experienced low flow on dialysis but was able to complete her session last Saturday.  We have discussed her options and she has agreed to proceed with right upper extremity shuntogram.  Findings: The graft to artery anastomosis is patent.  Up to the venous anastomosis there is approximately 30% stenosis and centrally there is 30% stenosis of the right innominate vein.  No intervention was undertaken.  Graft is okay for immediate use and remains amenable to future percutaneous evaluation with possible intervention.   Procedure:  The patient was identified in the holding area and taken to the heart and vascular procedure room.  The patient was then placed supine on the table and prepped and draped in the usual sterile fashion.  A time out was called.  Ultrasound was used to evaluate the right arm AV graft.  The area was anesthetized with 1% lidocaine  and the graft was cannulated with direct ultrasound visualization using micropuncture needle followed by wire and sheath.  Right upper extremity shuntogram including retrograde imaging with the graft compressed was performed.  With the above findings no intervention was undertaken in the sheath was removed and the cannulation site was suture-ligated.  She tolerated the procedure without Ameeth complication.  Contrast: 20 cc    Dhruvi Crenshaw C. Sheree, MD [facility] Office: [phone] Pager: [phone]   "

## 2024-04-21 NOTE — H&P (Signed)
 " H+P  History of Present Illness: This is a 78 y.o. female history of end-stage renal disease currently dialyzing Tuesdays, Thursdays and Saturdays via right arm AV graft.  She is experiencing low flow at dialysis and is here to discuss options moving forward.  Past Medical History:  Diagnosis Date   Anemia    Anxiety    Arthritis    lower back and knees    Asthma    childhood   Barrett's esophagus    CAD (coronary artery disease)    Chronic kidney disease    Coagulation defect    Depression    Diabetes mellitus    Dysphagia    Eczema    End stage renal disease (HCC)    Fluid overload, unspecified    History of herpes zoster 02/2010   Recovered fully after period of acute herpetic neuralgia tx w/ gabapentin .    Hx MRSA infection 2009   Hypercalcemia    Hyperlipidemia    Hypertension    Mitral regurgitation    moderate-severe MR 08/11/22 echo   Moderate protein-calorie malnutrition    Neuromuscular disorder (HCC)    chronic pain   Personal history of colonic polyps 10/17/2010   hyperplastic   Stroke [facility])     Past Surgical History:  Procedure Laterality Date   A/V FISTULAGRAM N/A 05/11/2023   Procedure: A/V Fistulagram;  Surgeon: [name], MD;  Location: [facility] INVASIVE CV LAB;  Service: Cardiovascular;  Laterality: N/A;   ABDOMINAL HYSTERECTOMY     unclear when   AV FISTULA PLACEMENT Left 02/13/2017   Procedure: ARTERIOVENOUS (AV) GORE-TEX STRETCH GRAFT INSERTION INTO LEFT ARM;  Surgeon: Harvey Carlin BRAVO, MD;  Location: [facility] OR;  Service: Vascular;  Laterality: Left;   AV FISTULA PLACEMENT Right 01/16/2022   Procedure: RIGHT ARM ARTERIOVENOUS (AV) FISTULA;  Surgeon: [name], MD;  Location: [facility] OR;  Service: Vascular;  Laterality: Right;   AV FISTULA PLACEMENT Right 09/01/2022   Procedure: INSERTION OF ARTERIOVENOUS (AV) GORE-TEX GRAFT RIGHT ARM;  Surgeon: Sheree Penne Lonni, MD;  Location[facility] OR;  Service: Vascular;  Laterality: Right;   CATARACT  EXTRACTION Bilateral    COLONOSCOPY     INSERTION OF ARTERIOVENOUS (AV) ARTEGRAFT ARM Right 11/09/2023   Procedure: INSERTION BRACHIAL ARTERY TO AXILLARY VEIN ARTERIOVENOUS GRAFT, UPPER EXTREMITY;  Surgeon: Lanis Fonda BRAVO, MD;  Location: [facility] OR;  Service: Vascular;  Laterality: Right;   IR FLUORO GUIDE CV LINE RIGHT  01/09/2022   IR FLUORO GUIDE CV LINE RIGHT  09/03/2023   IR REMOVAL TUN CV CATH W/O FL  02/15/2024   IR US  GUIDE VASC ACCESS RIGHT  01/09/2022   IR US  GUIDE VASC ACCESS RIGHT  09/03/2023   OTHER SURGICAL HISTORY     Catherer placed by [facility], right upper chest area.   PERIPHERAL VASCULAR BALLOON ANGIOPLASTY  05/11/2023   Procedure: PERIPHERAL VASCULAR BALLOON ANGIOPLASTY;  Surgeon: [name], MD;  Location: [facility] INVASIVE CV LAB;  Service: Cardiovascular;;  Venous Anastomosis   PERIPHERAL VASCULAR BALLOON ANGIOPLASTY  05/30/2023   Procedure: PERIPHERAL VASCULAR BALLOON ANGIOPLASTY;  Surgeon: [name], MD;  Location: [facility] INVASIVE CV LAB;  Service: Cardiovascular;;  Venous Anastomosis; Distal Intragraft   PERIPHERAL VASCULAR THROMBECTOMY N/A 05/30/2023   Procedure: PERIPHERAL VASCULAR THROMBECTOMY;  Surgeon: [name], MD;  Location: [facility] INVASIVE CV LAB;  Service: Cardiovascular;  Laterality: N/A;   thigh surg     left side due to MRSA   TUNNELLED CATHETER EXCHANGE  N/A 12/06/2023   Procedure: TUNNELLED CATHETER EXCHANGE;  Surgeon: [name], MD;  Location: [facility] PV LAB;  Service: Cardiovascular;  Laterality: N/A;   UPPER EXTREMITY VENOGRAPHY Left 10/29/2023   Procedure: UPPER EXTREMITY VENOGRAPHY;  Surgeon: [name], MD;  Location: [facility] INVASIVE CV LAB;  Service: Cardiovascular;  Laterality: Left;   UPPER GASTROINTESTINAL ENDOSCOPY      Allergies[1]  Prior to Admission medications  Medication Sig Start Date End Date Taking? Authorizing Provider  acetaminophen  (TYLENOL ) 500 MG tablet Take 1,000 mg by mouth every 8 (eight) hours as needed for  headache.    [provider]  aspirin  81 MG chewable tablet Chew 1 tablet (81 mg total) by mouth daily. 05/12/17   Regalado, Belkys A, MD  calcium  acetate (PHOSLO) 667 MG capsule Take 1,334 mg by mouth 3 (three) times daily with meals.    [provider]  camphor-menthol VIKKI) lotion Apply 1 Application topically as needed for itching. 10/15/23   Medina-Vargas, Monina C, NP  Cinacalcet  HCl (SENSIPAR  PO) Take 1 capsule by mouth Every Tuesday,Thursday,and Saturday with dialysis.    [provider]  clopidogrel  (PLAVIX ) 75 MG tablet TAKE 1 TABLET BY MOUTH EVERY DAY 11/26/23   Eubanks, Jessica K, NP  ezetimibe  (ZETIA ) 10 MG tablet TAKE 1 TABLET BY MOUTH EVERY DAY 09/24/23   Fargo, Amy E, NP  folic acid -vitamin b complex-vitamin c-selenium-zinc  (DIALYVITE) 3 MG TABS tablet Take 1 tablet by mouth daily.    [provider]  latanoprost  (XALATAN ) 0.005 % ophthalmic solution Place 1 drop into both eyes at bedtime.    [provider]  pantoprazole  (PROTONIX ) 40 MG tablet TAKE 1 TABLET BY MOUTH EVERY DAY 01/09/24   Eubanks, Jessica K, NP  pravastatin  (PRAVACHOL ) 40 MG tablet TAKE 1 TABLET BY MOUTH EVERYDAY AT BEDTIME 09/24/23   Fargo, Amy E, NP  sertraline  (ZOLOFT ) 50 MG tablet Take 1 tablet (50 mg total) by mouth daily. 11/26/23   [name], NP  sevelamer (RENAGEL) 800 MG tablet Take 2,400 mg by mouth 3 (three) times daily with meals.    [provider]  traZODone  (DESYREL ) 100 MG tablet TAKE 1 TABLET BY MOUTH EVERYDAY AT BEDTIME 09/24/23   Fargo, Amy E, NP  triamcinolone  cream (KENALOG ) 0.1 % APPLY TOPICALLY 3 (THREE) TIMES DAILY AS NEEDED. DX L30.9 MUST KEEP APPT WITH DERMATOLOGY 01/09/24   [name], NP    Social History   Socioeconomic History   Marital status: Divorced    Spouse name: Not on file   Number of children: 0   Years of education: Not on file   Highest education level: Not on file  Occupational History   Occupation: Retired     Associate Professor: SELF-EMPLOYED  Tobacco Use   Smoking status: Every Day    Current packs/day: 0.50    Average packs/day: 0.5 packs/day for 40.0 years (20.0 ttl pk-yrs)    Types: Cigarettes    Passive exposure: Never   Smokeless tobacco: Never   Tobacco comments:    smokes 1 pack of cigerettes a week- off and on  Vaping Use   Vaping status: Never Used  Substance and Sexual Activity   Alcohol use: Yes    Alcohol/week: 2.0 standard drinks of alcohol    Types: 2 Standard drinks or equivalent per week    Comment: wine and beer- occasionally    Drug use: No   Sexual activity: Never  Other Topics Concern   Not on file  Social  History Narrative   Financial assistance approved for 100% discount at [facility] and has [facility] card per [employer]   02/10/2010      3 caffeine drinks daily    Social Drivers of Health   Tobacco Use: High Risk (01/21/2024)   Patient History    Smoking Tobacco Use: Every Day    Smokeless Tobacco Use: Never    Passive Exposure: Never  Financial Resource Strain: Not on file  Food Insecurity: No Food Insecurity (01/04/2024)   Epic    Worried About Programme Researcher, Broadcasting/film/video in the Last Year: Never true    Ran Out of Food in the Last Year: Never true  Transportation Needs: No Transportation Needs (01/04/2024)   Epic    Lack of Transportation (Medical): No    Lack of Transportation (Non-Medical): No  Physical Activity: Not on file  Stress: Not on file  Social Connections: Socially Isolated (01/04/2024)   Social Connection and Isolation Panel    Frequency of Communication with Friends and Family: More than three times a week    Frequency of Social Gatherings with Friends and Family: More than three times a week    Attends Religious Services: Never    Database Administrator or Organizations: No    Attends Banker Meetings: Never    Marital Status: Divorced  Catering Manager Violence: Not At Risk (01/04/2024)   Epic    Fear of Current or Ex-Partner: No     Emotionally Abused: No    Physically Abused: No    Sexually Abused: No  Depression (PHQ2-9): Low Risk (01/04/2024)   Depression (PHQ2-9)    PHQ-2 Score: 0  Alcohol Screen: Not on file  Housing: Low Risk (01/04/2024)   Epic    Unable to Pay for Housing in the Last Year: No    Number of Times Moved in the Last Year: 0    Homeless in the Last Year: No  Utilities: Not At Risk (01/04/2024)   Epic    Threatened with loss of utilities: No  Health Literacy: Not on file     Family History  Problem Relation Age of Onset   Hypertension Father    Kidney disease Father    Diabetes Brother    Colon cancer Neg Hx    CAD Neg Hx    Rectal cancer Neg Hx    Stomach cancer Neg Hx     ROS:  Cardiovascular: []  chest pain/pressure []  palpitations []  SOB lying flat []  DOE []  pain in legs while walking []  pain in legs at rest []  pain in legs at night []  non-healing ulcers []  hx of DVT []  swelling in legs  Pulmonary: []  productive cough []  asthma/wheezing []  home O2  Neurologic: []  weakness in []  arms []  legs []  numbness in []  arms []  legs []  hx of CVA []  mini stroke [] difficulty speaking or slurred speech []  temporary loss of vision in one eye []  dizziness  Hematologic: []  hx of cancer []  bleeding problems []  problems with blood clotting easily  Endocrine:   []  diabetes []  thyroid  disease  GI []  vomiting blood []  blood in stool  GU: []  CKD/renal failure []  HD--[]  M/W/F or []  T/T/S []  burning with urination [x]  low flow on dialysis  Psychiatric: []  anxiety []  depression  Musculoskeletal: []  arthritis []  joint pain  Integumentary: []  rashes []  ulcers  Constitutional: []  fever []  chills   Physical Examination  Vitals:   04/21/24 0809 04/21/24 0812  BP: (!) 148/76 ROLLEN)  148/76  Pulse: 90 87  Resp: 12 14  Temp: 98.4 F (36.9 C)   SpO2: 94% 96%   There is no height or weight on file to calculate BMI.  Aaox3 Non labored respirations Right arm graft  with thrill, mild pulsatility  CBC    Component Value Date/Time   WBC 6.9 12/07/2023 0903   RBC 3.12 (L) 12/07/2023 0903   HGB 9.3 (L) 12/07/2023 0903   HGB 11.2 08/27/2015 0937   HCT 30.1 (L) 12/07/2023 0903   HCT 33.7 (L) 08/27/2015 0937   PLT 227 12/07/2023 0903   PLT 278 08/27/2015 0937   MCV 96.5 12/07/2023 0903   MCV 86 08/27/2015 0937   MCH 29.8 12/07/2023 0903   MCHC 30.9 12/07/2023 0903   RDW 18.3 (H) 12/07/2023 0903   RDW 15.4 08/27/2015 0937   LYMPHSABS 1.2 12/07/2023 0903   LYMPHSABS 2.1 08/27/2015 0937   MONOABS 0.6 12/07/2023 0903   EOSABS 0.3 12/07/2023 0903   EOSABS 0.4 08/27/2015 0937   BASOSABS 0.1 12/07/2023 0903   BASOSABS 0.1 08/27/2015 0937    BMET    Component Value Date/Time   NA 134 (L) 12/07/2023 1519   NA 142 09/20/2017 0000   K 3.0 (L) 12/07/2023 1519   CL 95 (L) 12/07/2023 1519   CO2 27 12/07/2023 1519   GLUCOSE 77 12/07/2023 1519   BUN 25 (H) 12/07/2023 1519   BUN 20 09/08/2015 0859   CREATININE 4.92 (H) 12/07/2023 1519   CREATININE 6.33 (H) 09/28/2016 0903   CALCIUM  8.6 (L) 12/07/2023 1519   CALCIUM  7.9 (L) 03/09/2017 0857   GFRNONAA 9 (L) 12/07/2023 1519   GFRNONAA 6 (L) 09/28/2016 0903   GFRAA 7 (L) 07/30/2018 1311   GFRAA 7 (L) 09/28/2016 0903    COAGS: Lab Results  Component Value Date   INR 0.98 12/02/2016   INR 0.95 07/14/2016       ASSESSMENT/PLAN: This is a 78 y.o. female with end-stage renal disease dialyzing via right arm AV graft with low flow on dialysis.  We discussed her options she has elected to proceed with right upper extremity shuntogram possible intervention.  We discussed the risk benefits alternatives and she demonstrates good understanding and consent was signed.   Ngozi Alvidrez C. Sheree, MD [facility] Office: [phone] Pager: [phone]     [1]  Allergies Allergen Reactions   Lisinopril  Other (See Comments)    Abnormal Kidney Function    Pioglitazone Other  (See Comments)    Desquamation of skin of the palm   Morphine And Codeine Nausea And Vomiting   Sulfonamide Derivatives Rash   "

## 2024-05-20 ENCOUNTER — Other Ambulatory Visit: Payer: Self-pay | Admitting: Nurse Practitioner

## 2024-07-04 ENCOUNTER — Ambulatory Visit: Payer: Self-pay | Admitting: Nurse Practitioner

## 2024-08-04 ENCOUNTER — Other Ambulatory Visit (HOSPITAL_BASED_OUTPATIENT_CLINIC_OR_DEPARTMENT_OTHER)

## 2024-12-05 ENCOUNTER — Ambulatory Visit: Payer: Self-pay | Admitting: Nurse Practitioner
